# Patient Record
Sex: Female | Born: 1950
Health system: Southern US, Community
[De-identification: ages and names within clinical notes are randomized; demographics above are authoritative.]

## PROBLEM LIST (undated history)

## (undated) DIAGNOSIS — N390 Urinary tract infection, site not specified: Secondary | ICD-10-CM

## (undated) DIAGNOSIS — Z8719 Personal history of other diseases of the digestive system: Secondary | ICD-10-CM

## (undated) DIAGNOSIS — Z9889 Other specified postprocedural states: Secondary | ICD-10-CM

## (undated) DIAGNOSIS — E860 Dehydration: Secondary | ICD-10-CM

## (undated) DIAGNOSIS — K219 Gastro-esophageal reflux disease without esophagitis: Secondary | ICD-10-CM

## (undated) DIAGNOSIS — K209 Esophagitis, unspecified without bleeding: Secondary | ICD-10-CM

## (undated) DIAGNOSIS — F419 Anxiety disorder, unspecified: Secondary | ICD-10-CM

## (undated) DIAGNOSIS — E872 Acidosis, unspecified: Secondary | ICD-10-CM

## (undated) DIAGNOSIS — R112 Nausea with vomiting, unspecified: Secondary | ICD-10-CM

## (undated) DIAGNOSIS — K602 Anal fissure, unspecified: Secondary | ICD-10-CM

## (undated) DIAGNOSIS — E785 Hyperlipidemia, unspecified: Secondary | ICD-10-CM

## (undated) DIAGNOSIS — R42 Dizziness and giddiness: Secondary | ICD-10-CM

## (undated) DIAGNOSIS — N952 Postmenopausal atrophic vaginitis: Secondary | ICD-10-CM

## (undated) DIAGNOSIS — I10 Essential (primary) hypertension: Secondary | ICD-10-CM

## (undated) DIAGNOSIS — N920 Excessive and frequent menstruation with regular cycle: Secondary | ICD-10-CM

## (undated) DIAGNOSIS — K648 Other hemorrhoids: Secondary | ICD-10-CM

## (undated) DIAGNOSIS — M8580 Other specified disorders of bone density and structure, unspecified site: Secondary | ICD-10-CM

## (undated) DIAGNOSIS — C541 Malignant neoplasm of endometrium: Secondary | ICD-10-CM

## (undated) DIAGNOSIS — K469 Unspecified abdominal hernia without obstruction or gangrene: Secondary | ICD-10-CM

## (undated) DIAGNOSIS — T884XXA Failed or difficult intubation, initial encounter: Secondary | ICD-10-CM

## (undated) HISTORY — DX: Urinary tract infection, site not specified: N39.0

## (undated) HISTORY — DX: Other hemorrhoids: K64.8

## (undated) HISTORY — PX: DILATION AND CURETTAGE OF UTERUS: SHX78

## (undated) HISTORY — DX: Postmenopausal atrophic vaginitis: N95.2

## (undated) HISTORY — DX: Esophagitis, unspecified: K20.9

## (undated) HISTORY — DX: Gastro-esophageal reflux disease without esophagitis: K21.9

## (undated) HISTORY — DX: Excessive and frequent menstruation with regular cycle: N92.0

## (undated) HISTORY — DX: Acidosis, unspecified: E87.20

## (undated) HISTORY — DX: Unspecified abdominal hernia without obstruction or gangrene: K46.9

## (undated) HISTORY — DX: Essential (primary) hypertension: I10

## (undated) HISTORY — PX: TUBAL LIGATION: SHX77

## (undated) HISTORY — DX: Anal fissure, unspecified: K60.2

## (undated) HISTORY — DX: Dehydration: E86.0

## (undated) HISTORY — DX: Other specified disorders of bone density and structure, unspecified site: M85.80

## (undated) HISTORY — DX: Esophagitis, unspecified without bleeding: K20.90

## (undated) HISTORY — DX: Malignant neoplasm of endometrium: C54.1

## (undated) SURGERY — Surgical Case
Anesthesia: *Unknown

---

## 1995-05-30 HISTORY — PX: CHOLECYSTECTOMY: SHX55

## 2000-01-12 ENCOUNTER — Other Ambulatory Visit: Admission: RE | Admit: 2000-01-12 | Discharge: 2000-01-12 | Payer: Self-pay | Admitting: Family Medicine

## 2000-01-23 ENCOUNTER — Encounter: Admission: RE | Admit: 2000-01-23 | Discharge: 2000-01-23 | Payer: Self-pay | Admitting: Family Medicine

## 2000-01-23 ENCOUNTER — Encounter: Payer: Self-pay | Admitting: Family Medicine

## 2000-09-19 ENCOUNTER — Other Ambulatory Visit: Admission: RE | Admit: 2000-09-19 | Discharge: 2000-09-19 | Payer: Self-pay | Admitting: *Deleted

## 2000-10-17 ENCOUNTER — Other Ambulatory Visit: Admission: RE | Admit: 2000-10-17 | Discharge: 2000-10-17 | Payer: Self-pay | Admitting: Obstetrics and Gynecology

## 2000-10-17 ENCOUNTER — Encounter (INDEPENDENT_AMBULATORY_CARE_PROVIDER_SITE_OTHER): Payer: Self-pay

## 2000-11-15 ENCOUNTER — Ambulatory Visit (HOSPITAL_COMMUNITY): Admission: RE | Admit: 2000-11-15 | Discharge: 2000-11-15 | Payer: Self-pay | Admitting: Obstetrics and Gynecology

## 2000-11-15 ENCOUNTER — Encounter (INDEPENDENT_AMBULATORY_CARE_PROVIDER_SITE_OTHER): Payer: Self-pay | Admitting: Specialist

## 2000-11-15 HISTORY — PX: HYSTEROSCOPY: SHX211

## 2000-12-04 ENCOUNTER — Encounter (INDEPENDENT_AMBULATORY_CARE_PROVIDER_SITE_OTHER): Payer: Self-pay | Admitting: *Deleted

## 2000-12-04 ENCOUNTER — Inpatient Hospital Stay (HOSPITAL_COMMUNITY): Admission: RE | Admit: 2000-12-04 | Discharge: 2000-12-06 | Payer: Self-pay | Admitting: Obstetrics and Gynecology

## 2000-12-04 DIAGNOSIS — C541 Malignant neoplasm of endometrium: Secondary | ICD-10-CM

## 2000-12-04 HISTORY — PX: OOPHORECTOMY: SHX86

## 2000-12-04 HISTORY — PX: ABDOMINAL HYSTERECTOMY: SHX81

## 2000-12-04 HISTORY — DX: Malignant neoplasm of endometrium: C54.1

## 2001-01-23 ENCOUNTER — Encounter: Admission: RE | Admit: 2001-01-23 | Discharge: 2001-01-23 | Payer: Self-pay | Admitting: Obstetrics and Gynecology

## 2001-01-23 ENCOUNTER — Encounter: Payer: Self-pay | Admitting: Obstetrics and Gynecology

## 2001-02-28 ENCOUNTER — Other Ambulatory Visit: Admission: RE | Admit: 2001-02-28 | Discharge: 2001-02-28 | Payer: Self-pay | Admitting: Obstetrics and Gynecology

## 2001-06-11 ENCOUNTER — Other Ambulatory Visit: Admission: RE | Admit: 2001-06-11 | Discharge: 2001-06-11 | Payer: Self-pay | Admitting: Obstetrics and Gynecology

## 2001-09-05 ENCOUNTER — Other Ambulatory Visit: Admission: RE | Admit: 2001-09-05 | Discharge: 2001-09-05 | Payer: Self-pay | Admitting: Obstetrics and Gynecology

## 2001-12-26 ENCOUNTER — Other Ambulatory Visit: Admission: RE | Admit: 2001-12-26 | Discharge: 2001-12-26 | Payer: Self-pay | Admitting: Obstetrics and Gynecology

## 2002-01-24 ENCOUNTER — Encounter: Admission: RE | Admit: 2002-01-24 | Discharge: 2002-01-24 | Payer: Self-pay | Admitting: Obstetrics and Gynecology

## 2002-01-24 ENCOUNTER — Encounter: Payer: Self-pay | Admitting: Obstetrics and Gynecology

## 2002-07-04 ENCOUNTER — Other Ambulatory Visit: Admission: RE | Admit: 2002-07-04 | Discharge: 2002-07-04 | Payer: Self-pay | Admitting: Obstetrics and Gynecology

## 2002-07-04 ENCOUNTER — Encounter: Payer: Self-pay | Admitting: Emergency Medicine

## 2002-07-04 ENCOUNTER — Emergency Department (HOSPITAL_COMMUNITY): Admission: EM | Admit: 2002-07-04 | Discharge: 2002-07-04 | Payer: Self-pay | Admitting: Emergency Medicine

## 2003-01-26 ENCOUNTER — Other Ambulatory Visit: Admission: RE | Admit: 2003-01-26 | Discharge: 2003-01-26 | Payer: Self-pay | Admitting: Obstetrics and Gynecology

## 2003-01-28 ENCOUNTER — Encounter: Payer: Self-pay | Admitting: Obstetrics and Gynecology

## 2003-01-28 ENCOUNTER — Encounter: Admission: RE | Admit: 2003-01-28 | Discharge: 2003-01-28 | Payer: Self-pay | Admitting: Obstetrics and Gynecology

## 2003-07-13 ENCOUNTER — Other Ambulatory Visit: Admission: RE | Admit: 2003-07-13 | Discharge: 2003-07-13 | Payer: Self-pay | Admitting: Obstetrics and Gynecology

## 2003-07-21 ENCOUNTER — Emergency Department (HOSPITAL_COMMUNITY): Admission: EM | Admit: 2003-07-21 | Discharge: 2003-07-21 | Payer: Self-pay | Admitting: Emergency Medicine

## 2003-08-15 ENCOUNTER — Emergency Department (HOSPITAL_COMMUNITY): Admission: EM | Admit: 2003-08-15 | Discharge: 2003-08-15 | Payer: Self-pay | Admitting: Emergency Medicine

## 2003-10-02 ENCOUNTER — Emergency Department (HOSPITAL_COMMUNITY): Admission: EM | Admit: 2003-10-02 | Discharge: 2003-10-02 | Payer: Self-pay | Admitting: Emergency Medicine

## 2003-12-03 ENCOUNTER — Emergency Department (HOSPITAL_COMMUNITY): Admission: EM | Admit: 2003-12-03 | Discharge: 2003-12-03 | Payer: Self-pay | Admitting: Emergency Medicine

## 2004-01-27 ENCOUNTER — Other Ambulatory Visit: Admission: RE | Admit: 2004-01-27 | Discharge: 2004-01-27 | Payer: Self-pay | Admitting: Obstetrics and Gynecology

## 2004-02-15 ENCOUNTER — Emergency Department (HOSPITAL_COMMUNITY): Admission: EM | Admit: 2004-02-15 | Discharge: 2004-02-15 | Payer: Self-pay | Admitting: Emergency Medicine

## 2004-03-07 ENCOUNTER — Encounter: Admission: RE | Admit: 2004-03-07 | Discharge: 2004-03-07 | Payer: Self-pay | Admitting: Family Medicine

## 2004-04-04 ENCOUNTER — Ambulatory Visit: Payer: Self-pay | Admitting: Family Medicine

## 2004-04-26 ENCOUNTER — Ambulatory Visit: Payer: Self-pay | Admitting: Family Medicine

## 2004-05-24 ENCOUNTER — Emergency Department (HOSPITAL_COMMUNITY): Admission: EM | Admit: 2004-05-24 | Discharge: 2004-05-24 | Payer: Self-pay | Admitting: Emergency Medicine

## 2004-05-29 HISTORY — PX: CARDIAC CATHETERIZATION: SHX172

## 2004-05-31 ENCOUNTER — Emergency Department (HOSPITAL_COMMUNITY): Admission: EM | Admit: 2004-05-31 | Discharge: 2004-05-31 | Payer: Self-pay | Admitting: Emergency Medicine

## 2004-06-02 ENCOUNTER — Ambulatory Visit: Payer: Self-pay | Admitting: Family Medicine

## 2004-06-10 ENCOUNTER — Emergency Department (HOSPITAL_COMMUNITY): Admission: EM | Admit: 2004-06-10 | Discharge: 2004-06-10 | Payer: Self-pay | Admitting: Emergency Medicine

## 2004-06-17 ENCOUNTER — Emergency Department: Payer: Self-pay | Admitting: Emergency Medicine

## 2004-06-22 ENCOUNTER — Emergency Department (HOSPITAL_COMMUNITY): Admission: EM | Admit: 2004-06-22 | Discharge: 2004-06-22 | Payer: Self-pay | Admitting: Emergency Medicine

## 2004-06-23 ENCOUNTER — Ambulatory Visit: Payer: Self-pay | Admitting: Family Medicine

## 2004-06-26 ENCOUNTER — Emergency Department (HOSPITAL_COMMUNITY): Admission: EM | Admit: 2004-06-26 | Discharge: 2004-06-26 | Payer: Self-pay | Admitting: Emergency Medicine

## 2004-06-30 ENCOUNTER — Ambulatory Visit: Payer: Self-pay | Admitting: Family Medicine

## 2004-07-17 ENCOUNTER — Other Ambulatory Visit: Payer: Self-pay

## 2004-07-18 ENCOUNTER — Inpatient Hospital Stay: Payer: Self-pay | Admitting: Anesthesiology

## 2004-07-20 ENCOUNTER — Emergency Department: Payer: Self-pay | Admitting: Emergency Medicine

## 2004-07-20 ENCOUNTER — Other Ambulatory Visit: Payer: Self-pay

## 2004-08-11 ENCOUNTER — Emergency Department (HOSPITAL_COMMUNITY): Admission: EM | Admit: 2004-08-11 | Discharge: 2004-08-11 | Payer: Self-pay | Admitting: Emergency Medicine

## 2004-08-12 ENCOUNTER — Ambulatory Visit: Payer: Self-pay | Admitting: Internal Medicine

## 2004-08-15 ENCOUNTER — Other Ambulatory Visit: Admission: RE | Admit: 2004-08-15 | Discharge: 2004-08-15 | Payer: Self-pay | Admitting: Obstetrics and Gynecology

## 2004-08-16 ENCOUNTER — Emergency Department: Payer: Self-pay | Admitting: Emergency Medicine

## 2004-09-26 ENCOUNTER — Emergency Department (HOSPITAL_COMMUNITY): Admission: EM | Admit: 2004-09-26 | Discharge: 2004-09-27 | Payer: Self-pay | Admitting: Emergency Medicine

## 2004-10-30 ENCOUNTER — Emergency Department (HOSPITAL_COMMUNITY): Admission: EM | Admit: 2004-10-30 | Discharge: 2004-10-30 | Payer: Self-pay | Admitting: Emergency Medicine

## 2004-11-19 ENCOUNTER — Emergency Department: Payer: Self-pay | Admitting: Emergency Medicine

## 2004-11-19 ENCOUNTER — Other Ambulatory Visit: Payer: Self-pay

## 2004-12-29 ENCOUNTER — Emergency Department (HOSPITAL_COMMUNITY): Admission: EM | Admit: 2004-12-29 | Discharge: 2004-12-29 | Payer: Self-pay | Admitting: Emergency Medicine

## 2005-01-21 ENCOUNTER — Emergency Department: Payer: Self-pay | Admitting: Emergency Medicine

## 2005-01-22 ENCOUNTER — Emergency Department (HOSPITAL_COMMUNITY): Admission: EM | Admit: 2005-01-22 | Discharge: 2005-01-22 | Payer: Self-pay | Admitting: Emergency Medicine

## 2005-01-31 ENCOUNTER — Other Ambulatory Visit: Admission: RE | Admit: 2005-01-31 | Discharge: 2005-01-31 | Payer: Self-pay | Admitting: Obstetrics and Gynecology

## 2005-03-03 ENCOUNTER — Emergency Department (HOSPITAL_COMMUNITY): Admission: EM | Admit: 2005-03-03 | Discharge: 2005-03-03 | Payer: Self-pay | Admitting: Emergency Medicine

## 2005-03-15 ENCOUNTER — Encounter: Admission: RE | Admit: 2005-03-15 | Discharge: 2005-03-15 | Payer: Self-pay | Admitting: Obstetrics and Gynecology

## 2005-03-21 ENCOUNTER — Ambulatory Visit: Payer: Self-pay | Admitting: Cardiovascular Disease

## 2005-04-02 ENCOUNTER — Other Ambulatory Visit: Payer: Self-pay

## 2005-04-02 ENCOUNTER — Emergency Department: Payer: Self-pay | Admitting: Unknown Physician Specialty

## 2005-05-24 ENCOUNTER — Emergency Department (HOSPITAL_COMMUNITY): Admission: EM | Admit: 2005-05-24 | Discharge: 2005-05-24 | Payer: Self-pay | Admitting: Emergency Medicine

## 2005-08-07 ENCOUNTER — Other Ambulatory Visit: Admission: RE | Admit: 2005-08-07 | Discharge: 2005-08-07 | Payer: Self-pay | Admitting: Obstetrics and Gynecology

## 2005-09-14 ENCOUNTER — Emergency Department (HOSPITAL_COMMUNITY): Admission: EM | Admit: 2005-09-14 | Discharge: 2005-09-14 | Payer: Self-pay | Admitting: Emergency Medicine

## 2005-10-07 ENCOUNTER — Emergency Department: Payer: Self-pay | Admitting: General Practice

## 2005-12-01 ENCOUNTER — Emergency Department (HOSPITAL_COMMUNITY): Admission: EM | Admit: 2005-12-01 | Discharge: 2005-12-01 | Payer: Self-pay | Admitting: Emergency Medicine

## 2006-01-15 ENCOUNTER — Emergency Department: Payer: Self-pay | Admitting: Emergency Medicine

## 2006-01-15 ENCOUNTER — Other Ambulatory Visit: Payer: Self-pay

## 2006-02-01 ENCOUNTER — Other Ambulatory Visit: Admission: RE | Admit: 2006-02-01 | Discharge: 2006-02-01 | Payer: Self-pay | Admitting: Obstetrics and Gynecology

## 2006-03-29 ENCOUNTER — Encounter: Admission: RE | Admit: 2006-03-29 | Discharge: 2006-03-29 | Payer: Self-pay | Admitting: Obstetrics and Gynecology

## 2006-07-17 ENCOUNTER — Emergency Department: Payer: Self-pay | Admitting: Unknown Physician Specialty

## 2006-07-17 ENCOUNTER — Other Ambulatory Visit: Payer: Self-pay

## 2006-07-18 ENCOUNTER — Ambulatory Visit: Payer: Self-pay | Admitting: Unknown Physician Specialty

## 2006-08-06 ENCOUNTER — Emergency Department: Payer: Self-pay | Admitting: Internal Medicine

## 2006-08-06 ENCOUNTER — Other Ambulatory Visit: Payer: Self-pay

## 2006-10-25 ENCOUNTER — Other Ambulatory Visit: Payer: Self-pay

## 2006-10-25 ENCOUNTER — Emergency Department: Payer: Self-pay | Admitting: Internal Medicine

## 2007-01-04 ENCOUNTER — Emergency Department: Payer: Self-pay | Admitting: Emergency Medicine

## 2007-01-04 ENCOUNTER — Other Ambulatory Visit: Payer: Self-pay

## 2007-01-23 DIAGNOSIS — F411 Generalized anxiety disorder: Secondary | ICD-10-CM | POA: Insufficient documentation

## 2007-01-23 DIAGNOSIS — I10 Essential (primary) hypertension: Secondary | ICD-10-CM | POA: Insufficient documentation

## 2007-02-07 ENCOUNTER — Other Ambulatory Visit: Admission: RE | Admit: 2007-02-07 | Discharge: 2007-02-07 | Payer: Self-pay | Admitting: Obstetrics and Gynecology

## 2007-02-27 ENCOUNTER — Other Ambulatory Visit: Payer: Self-pay

## 2007-02-27 ENCOUNTER — Observation Stay: Payer: Self-pay | Admitting: Endocrinology

## 2007-03-09 ENCOUNTER — Emergency Department (HOSPITAL_COMMUNITY): Admission: EM | Admit: 2007-03-09 | Discharge: 2007-03-09 | Payer: Self-pay | Admitting: Emergency Medicine

## 2007-03-19 ENCOUNTER — Other Ambulatory Visit: Payer: Self-pay

## 2007-03-19 ENCOUNTER — Emergency Department: Payer: Self-pay | Admitting: Emergency Medicine

## 2007-05-06 ENCOUNTER — Encounter: Admission: RE | Admit: 2007-05-06 | Discharge: 2007-05-06 | Payer: Self-pay | Admitting: Obstetrics and Gynecology

## 2007-06-03 ENCOUNTER — Ambulatory Visit: Payer: Self-pay | Admitting: Gastroenterology

## 2007-07-30 ENCOUNTER — Emergency Department: Payer: Self-pay | Admitting: Emergency Medicine

## 2007-08-13 ENCOUNTER — Emergency Department: Payer: Self-pay | Admitting: Unknown Physician Specialty

## 2007-08-13 ENCOUNTER — Other Ambulatory Visit: Payer: Self-pay

## 2007-10-12 ENCOUNTER — Emergency Department: Payer: Self-pay | Admitting: Emergency Medicine

## 2007-10-12 ENCOUNTER — Other Ambulatory Visit: Payer: Self-pay

## 2008-02-17 ENCOUNTER — Other Ambulatory Visit: Admission: RE | Admit: 2008-02-17 | Discharge: 2008-02-17 | Payer: Self-pay | Admitting: Obstetrics and Gynecology

## 2008-02-17 ENCOUNTER — Ambulatory Visit: Payer: Self-pay | Admitting: Obstetrics and Gynecology

## 2008-02-17 ENCOUNTER — Encounter: Payer: Self-pay | Admitting: Obstetrics and Gynecology

## 2008-04-19 ENCOUNTER — Emergency Department: Payer: Self-pay | Admitting: Emergency Medicine

## 2008-05-20 ENCOUNTER — Encounter: Admission: RE | Admit: 2008-05-20 | Discharge: 2008-05-20 | Payer: Self-pay | Admitting: Obstetrics and Gynecology

## 2008-08-21 ENCOUNTER — Emergency Department: Payer: Self-pay | Admitting: Internal Medicine

## 2008-10-03 ENCOUNTER — Emergency Department: Payer: Self-pay | Admitting: Emergency Medicine

## 2008-11-10 ENCOUNTER — Observation Stay: Payer: Self-pay | Admitting: Endocrinology

## 2009-02-17 ENCOUNTER — Ambulatory Visit: Payer: Self-pay | Admitting: Obstetrics and Gynecology

## 2009-02-17 ENCOUNTER — Other Ambulatory Visit: Admission: RE | Admit: 2009-02-17 | Discharge: 2009-02-17 | Payer: Self-pay | Admitting: Obstetrics and Gynecology

## 2009-02-17 ENCOUNTER — Encounter: Payer: Self-pay | Admitting: Obstetrics and Gynecology

## 2009-03-03 ENCOUNTER — Ambulatory Visit: Payer: Self-pay | Admitting: Obstetrics and Gynecology

## 2009-05-26 ENCOUNTER — Encounter: Admission: RE | Admit: 2009-05-26 | Discharge: 2009-05-26 | Payer: Self-pay | Admitting: Obstetrics and Gynecology

## 2009-06-14 ENCOUNTER — Ambulatory Visit: Payer: Self-pay

## 2009-12-15 ENCOUNTER — Ambulatory Visit: Payer: Self-pay | Admitting: Gastroenterology

## 2009-12-19 LAB — PATHOLOGY REPORT

## 2010-03-01 ENCOUNTER — Ambulatory Visit: Payer: Self-pay | Admitting: Obstetrics and Gynecology

## 2010-03-01 ENCOUNTER — Other Ambulatory Visit: Admission: RE | Admit: 2010-03-01 | Discharge: 2010-03-01 | Payer: Self-pay | Admitting: Obstetrics and Gynecology

## 2010-03-21 ENCOUNTER — Ambulatory Visit: Payer: Self-pay | Admitting: Obstetrics and Gynecology

## 2010-04-11 ENCOUNTER — Ambulatory Visit: Payer: Self-pay | Admitting: Obstetrics and Gynecology

## 2010-04-12 ENCOUNTER — Encounter (INDEPENDENT_AMBULATORY_CARE_PROVIDER_SITE_OTHER): Payer: Self-pay | Admitting: Internal Medicine

## 2010-06-01 ENCOUNTER — Encounter
Admission: RE | Admit: 2010-06-01 | Discharge: 2010-06-01 | Payer: Self-pay | Source: Home / Self Care | Attending: Obstetrics and Gynecology | Admitting: Obstetrics and Gynecology

## 2010-06-18 ENCOUNTER — Encounter: Payer: Self-pay | Admitting: Obstetrics and Gynecology

## 2010-06-19 ENCOUNTER — Encounter: Payer: Self-pay | Admitting: Obstetrics and Gynecology

## 2010-06-30 NOTE — Letter (Signed)
Summary: Blood sugar results  Blood sugar results   Imported By: Dorna Leitz 04/12/2010 14:03:38  _____________________________________________________________________  External Attachment:    Type:   Image     Comment:   External Document

## 2010-07-01 ENCOUNTER — Ambulatory Visit: Payer: Self-pay | Admitting: Rheumatology

## 2010-10-14 NOTE — Discharge Summary (Signed)
[facility]  Patient:    Valerie [name], Valerie [name]                       MRN: [id number] Adm. Date:  [id number] Disc. 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  On the day of admission, she was taken to the operating room where a total abdominal hysterectomy, bilateral salpingo-oophorectomy, and peritoneal washings were obtained.  The patient was surgically staged. There was no significant myometrial invasion and therefore it was elected not to do a lymph node sampling.  Postoperatively, she had just a slight ileus that responded to IV fluids and by the second postoperative day, she was ready for discharge.  DISCHARGE MEDICATION:  Tylox for pain relief.  DISCHARGE ACTIVITY:  Ambulatory.  DISCHARGE DIET:  Regular.  FINAL PATHOLOGY REPORT:  Revealed peritoneal washings with no malignant cells identified.  Uterus with endometrioid carcinoma - grade I, adenomyosis, no significant myometrial invasion.  Pathology was read by Dr. Almyra Free and was also read by Dr. Guilford Shi and she was staged as a grade IAI.  CONDITION ON DISCHARGE:  Improved.  DISCHARGE DIAGNOSES: 1. Endometrioid carcinoma of endometrium, stage IAI. 2. Adenomyosis.  OPERATION:  Total abdominal hysterectomy, bilateral salpingo-oophorectomy. DD:  12/12/00 TD:  12/12/00 Job: [id number]

## 2010-10-14 NOTE — Op Note (Signed)
[facility]  Patient:    Valerie [name], Valerie [name]                       MRN: [id number] Proc. Date: 12/04/00 Adm. Date:  [id number] Attending:  Sharon Mt                           Operative Report  PREOPERATIVE DIAGNOSIS:  Well differentiated adenocarcinoma of the endometrium, endometrial type.  POSTOPERATIVE DIAGNOSIS:  Well differentiated adenocarcinoma of the endometrium, endometrial type.  OPERATION: 1. Total abdominal hysterectomy. 2. Bilateral salpingo-oophorectomy.  SURGEON:  Daniel L. Eda Paschal, M.D.  ASSISTANT:  Douglass Rivers, M.D.  ANESTHESIA:  General endotracheal anesthesia.  FINDINGS:  At the time of laparotomy, the patients uterus was normal size and shape and mobile.  Ovaries and fallopian tubes were free of any disease. There was a small area of burned out endometriosis near the left ureter of approximately 2 to 3 mm.  It was very adjacent to the ureter and did not appear to be active.  DESCRIPTION OF PROCEDURE:  After adequate general endotracheal anesthesia, the patient was placed in the supine position and prepped and draped in the usual sterile manner.   A Foley catheter was inserted in the patients bladder.  A midline vertical incision was made and extended through the fascia into the peritoneum.  Subcutaneous bleeders were clamped and treated with Bovie as encountered.  When the peritoneal cavity was opened, the above findings were noted.  Peritoneal washings were obtained.  The round ligaments were then Bovie cauterized and cut.  The vesicouterine fold of peritoneum was incised sharply.  The retroperitoneal space was entered.  The ureters were identified. The infundibulopelvic ligaments were clamped, cut, and doubly suture ligated with #1 chromic catgut.  The posterior peritoneum was sharply dissected free. The uterine arteries were clamped, cut, and doubly suture ligated with #1 chromic catgut.  The parametrium was  taken down in successive bites by clamping and cutting, and suture ligating with #1 chromic catgut.  The cervicovaginal structure was identified, entered with sharp dissection, and the uterus was removed and sent for frozen section along with the ovaries and tubes.  Angle sutures were placed in the angles of vagina incorporating cardinal ligaments and uterosacral ligaments for good wall support.  The cuff was closed with a running locking 0 Vicryl.  Two sponge, needle, and instrument counts were correct.  Frozen section came back showing no invasion, and they actually could not even identify frank malignancy on the samples they took.  There was some hyperplasia.  As a result of that, lymph nodes were not done.  The peritoneum and the fascia were closed with a running suture of looped 0 PDS, tying it in the midline.  Copious irrigation was done with Ringers lactate, and the skin was closed with staples.  Estimated blood loss for the entire procedure was approximately 350 cc with none replaced.  The patient tolerated the procedure well and left the operating room in satisfactory condition draining clear urine from the Foley catheter. DD:  12/04/00 TD:  12/04/00 Job: [id number]

## 2010-10-14 NOTE — Op Note (Signed)
[facility]  Patient:    Valerie [name], Valerie [name]                           MRN: [id number] Proc. Date: 11/15/00 Attending:  Rande Brunt. Eda Paschal, M.D.                           Operative Report  PREOPERATIVE DIAGNOSES:  Dysfunctional uterine bleeding with multiple endometrial polyps.  POSTOPERATIVE DIAGNOSES:  Dysfunctional uterine bleeding with multiple endometrial polyps.  OPERATION PERFORMED:  Hysteroscopy and D&C and resection of endometrial polyps.  SURGEON:  Dr. Eda Paschal.  ANESTHESIA:  General.  INDICATIONS FOR PROCEDURE:  The patients a 60 year old gravida 2, para 2, AB 0 who had presented to the office with severe menorrhagia. Sonohysterogram was done in the office which revealed multiple filling defects in the endometrial cavity consistent with endometrial polyps. She now enters the hospital for hysteroscopy and excision of endometrial polyps.  FINDINGS:  External and vagina is within normal limits. Cervix is clean, uterus is anteverted normal size and shape with first degree uterine descensus. Adnexa are palpably normal. At the time of hysteroscopy, the patient had multiple endometrial polyps on both the anterior and posterior walls of the fundus. Once they all had been excised, there was no other pathology. The entire intrauterine cavity and endocervical canal could be systematically evaluated and was free of disease once the multiple polyps had been removed.  DESCRIPTION OF PROCEDURE:  After adequate general endotracheal anesthesia, the patient was placed in the dorsal lithotomy position, prepped and draped in the usual sterile manner. A single tooth tenaculum was placed in the anterior lip of the cervix, the cervix was dilated to a #35 Pratt dilator and then a hysteroscopic examination was done with the hysteroscopic resectoscope using 3% sorbitol to expand the intrauterine cavity and using a camera for magnification. Multiple polyps were seen.  A lot of them were removed at this point with sharp curettage and then the rest were removed with 90 degree wire loop set at 70 coag and 110 cutting blend one. At the termination of the procedure, the cavity was completely free of any polyps. All tissue was sent to pathology for tissue diagnosis. Estimated blood loss was less than 100 cc with a fluid deficit that was also less than 100 cc. The patient tolerated the procedure well and left the operating room in satisfactory condition. DD:  11/15/00 TD:  11/15/00 Job: [id number]

## 2010-10-14 NOTE — H&P (Signed)
[facility]  Patient:    Valerie [name], Valerie [name]                           MRN: [id number] Attending:  Rande Brunt. Eda Paschal, M.D.                         History and Physical  CHIEF COMPLAINT:  Adenocarcinoma of the endometrium.  HISTORY OF PRESENT ILLNESS:  The patient is a 60 year old gravida 3, para 2, AB 1 who presented to the office in late April 2002 with a history of menorrhagia.  Sonohysterogram was done in the office, which revealed some endometrial polyps, and as a result of this she was taken to the operating room on June 20 and underwent D&C hysteroscopy, with excision of a lot of tissue in the endometrium.  Final pathology report came back well-differentiated endometrioid adenocarcinoma with extensive squamous differentiation, ______ grade 1.  It was associated with secretory endometrium.  As a result of this, she enters the hospital now for total abdominal hysterectomy, bilateral salpingo-oophorectomy, and surgical staging with appropriate additional surgery.  Dr. De Blanch will be on standby if that is necessary.  PAST MEDICAL HISTORY:  Tubal ligation in the past, D&C hysteroscopy this year.  CURRENT MEDICATIONS:  None.  ALLERGIES:  She is allergic to no drugs.  FAMILY HISTORY:  She has two brothers, both of whom are diabetic.  She has a father who has had prostate cancer.  SOCIAL HISTORY:  She is a nonsmoker, nondrinker.  REVIEW OF SYSTEMS:  HEENT:  Negative.  CARDIAC:  Negative.  RESPIRATORY: Negative.  GI:  Negative.  GU:  Negative.  NEUROMUSCULAR:  Negative. ENDOCRINE:  Negative.  PHYSICAL EXAMINATION:  GENERAL:  The patient is a well-developed and well-nourished female in no acute distress.  VITAL SIGNS:  Blood pressure 132/86, pulse 80 and regular, respirations 16 and nonlabored.  She is afebrile.  HEENT:  All within normal limits.  NECK:  Supple.  Trachea in the midline.  Thyroid is not enlarged.  LUNGS:  Clear to  P&A.  HEART:  No thrills, heaves, or murmurs.  BREASTS:  No masses.  ABDOMEN:  Soft without guarding, rebound, or masses.  PELVIC:  External and vaginal is within normal limits.  Cervix is clean. Uterus is normal size and shape.  Adnexa are palpably normal.  RECTAL:  Negative.  EXTREMITIES:  Within normal limits.  IMPRESSION:  Well-differentiated adenocarcinoma of the endometrium.  PLAN:  Appropriate surgery, see above. DD:  12/04/00 TD:  12/04/00 Job: [id number]

## 2011-03-03 ENCOUNTER — Encounter: Payer: Self-pay | Admitting: Obstetrics and Gynecology

## 2011-03-03 DIAGNOSIS — E559 Vitamin D deficiency, unspecified: Secondary | ICD-10-CM | POA: Insufficient documentation

## 2011-03-03 DIAGNOSIS — K219 Gastro-esophageal reflux disease without esophagitis: Secondary | ICD-10-CM | POA: Insufficient documentation

## 2011-03-03 DIAGNOSIS — K469 Unspecified abdominal hernia without obstruction or gangrene: Secondary | ICD-10-CM | POA: Insufficient documentation

## 2011-03-03 DIAGNOSIS — N952 Postmenopausal atrophic vaginitis: Secondary | ICD-10-CM | POA: Insufficient documentation

## 2011-03-03 DIAGNOSIS — M8580 Other specified disorders of bone density and structure, unspecified site: Secondary | ICD-10-CM | POA: Insufficient documentation

## 2011-03-03 DIAGNOSIS — C541 Malignant neoplasm of endometrium: Secondary | ICD-10-CM | POA: Insufficient documentation

## 2011-03-03 DIAGNOSIS — N920 Excessive and frequent menstruation with regular cycle: Secondary | ICD-10-CM | POA: Insufficient documentation

## 2011-03-07 ENCOUNTER — Inpatient Hospital Stay: Admission: RE | Admit: 2011-03-07 | Payer: Self-pay | Source: Ambulatory Visit

## 2011-03-09 ENCOUNTER — Other Ambulatory Visit: Payer: Self-pay | Admitting: Obstetrics and Gynecology

## 2011-03-09 LAB — POCT CARDIAC MARKERS
CKMB, poc: 1.1
CKMB, poc: 1.2
Myoglobin, poc: 77.2
Myoglobin, poc: 79.6
Operator id: 133351
Operator id: 133351
Troponin i, poc: <0.05
Troponin i, poc: <0.05

## 2011-03-09 LAB — CBC
HCT: 39.1
Hemoglobin: 13.3
MCHC: 33.9
MCV: 92.6
Platelets: 267
RBC: 4.22
RDW: 14.2 — ABNORMAL HIGH
WBC: 6.2

## 2011-03-09 LAB — DIFFERENTIAL
Basophils Absolute: 0
Basophils Relative: 1
Eosinophils Absolute: 0.1
Eosinophils Relative: 2
Lymphocytes Relative: 47 — ABNORMAL HIGH
Lymphs Abs: 2.9
Monocytes Absolute: 0.5
Monocytes Relative: 8
Neutro Abs: 2.6
Neutrophils Relative %: 42 — ABNORMAL LOW

## 2011-03-09 LAB — I-STAT 8, (EC8 V) (CONVERTED LAB)
Acid-Base Excess: 1
BUN: 17
Bicarbonate: 25.8 — ABNORMAL HIGH
Chloride: 109
Glucose, Bld: 92
HCT: 41
Hemoglobin: 13.9
Operator id: 133351
Potassium: 3.9
Sodium: 142
TCO2: 27
pCO2, Ven: 38.8 — ABNORMAL LOW
pH, Ven: 7.431 — ABNORMAL HIGH

## 2011-03-09 LAB — HEPATIC FUNCTION PANEL
ALT: 21
AST: 27
Albumin: 3.8
Alkaline Phosphatase: 52
Bilirubin, Direct: <0.1
Total Bilirubin: 0.5
Total Protein: 6.9

## 2011-03-09 LAB — POCT I-STAT CREATININE
Creatinine, Ser: 0.8
Operator id: 133351

## 2011-03-09 LAB — D-DIMER, QUANTITATIVE (NOT AT ARMC): D-Dimer, Quant: 0.29

## 2011-04-06 ENCOUNTER — Other Ambulatory Visit: Payer: Self-pay | Admitting: Obstetrics and Gynecology

## 2011-05-07 ENCOUNTER — Emergency Department (HOSPITAL_COMMUNITY)
Admission: EM | Admit: 2011-05-07 | Discharge: 2011-05-07 | Disposition: A | Discharge status: Home / Self Care | Payer: BC Managed Care – PPO | Attending: Emergency Medicine | Admitting: Emergency Medicine

## 2011-05-07 ENCOUNTER — Encounter (HOSPITAL_COMMUNITY): Payer: Self-pay | Admitting: Emergency Medicine

## 2011-05-07 DIAGNOSIS — M25519 Pain in unspecified shoulder: Secondary | ICD-10-CM | POA: Insufficient documentation

## 2011-05-07 DIAGNOSIS — I10 Essential (primary) hypertension: Secondary | ICD-10-CM | POA: Insufficient documentation

## 2011-05-07 DIAGNOSIS — M542 Cervicalgia: Secondary | ICD-10-CM | POA: Insufficient documentation

## 2011-05-07 DIAGNOSIS — Z9889 Other specified postprocedural states: Secondary | ICD-10-CM | POA: Insufficient documentation

## 2011-05-07 DIAGNOSIS — K219 Gastro-esophageal reflux disease without esophagitis: Secondary | ICD-10-CM | POA: Insufficient documentation

## 2011-05-07 DIAGNOSIS — IMO0002 Reserved for concepts with insufficient information to code with codable children: Secondary | ICD-10-CM | POA: Insufficient documentation

## 2011-05-07 DIAGNOSIS — E559 Vitamin D deficiency, unspecified: Secondary | ICD-10-CM | POA: Insufficient documentation

## 2011-05-07 DIAGNOSIS — Z8673 Personal history of transient ischemic attack (TIA), and cerebral infarction without residual deficits: Secondary | ICD-10-CM | POA: Insufficient documentation

## 2011-05-07 HISTORY — DX: Dizziness and giddiness: R42

## 2011-05-07 MED ORDER — HYDROCODONE-ACETAMINOPHEN 5-325 MG PO TABS
1.0000 | ORAL_TABLET | Freq: Once | ORAL | Status: AC
  Administered 2011-05-07: 1 via ORAL
  Filled 2011-05-07: qty 1

## 2011-05-07 MED ORDER — HYDROCODONE-ACETAMINOPHEN 5-500 MG PO TABS: 1.0000 | ORAL_TABLET | Freq: Four times a day (QID) | ORAL | Status: AC | PRN

## 2011-05-07 MED ORDER — METHOCARBAMOL 750 MG PO TABS: 750.0000 mg | ORAL_TABLET | Freq: Three times a day (TID) | ORAL | Status: AC

## 2011-05-07 MED ORDER — IBUPROFEN 200 MG PO TABS
600.0000 mg | ORAL_TABLET | Freq: Once | ORAL | Status: AC
  Administered 2011-05-07: 600 mg via ORAL
  Filled 2011-05-07: qty 3

## 2011-05-07 NOTE — ED Notes (Signed)
Rx given x2 D/c instructions reviewed w/ pt and family - pt and family deny any further questions or concerns at present.  

## 2011-05-07 NOTE — ED Notes (Signed)
C/o neck pain that has been worse over the past week.  History of neck pain.  Reports dizziness with driving.

## 2011-05-07 NOTE — ED Provider Notes (Signed)
History     CSN: [id number] Arrival date & time: 05/07/2011  6:10 PM   First MD Initiated Contact with Patient 05/07/11 1903      Chief Complaint  Patient presents with  . Neck Pain    (Consider location/radiation/quality/duration/timing/severity/associated sxs/prior treatment) Patient is a 60 y.o. female presenting with neck pain. The history is provided by the patient.  Neck Pain  Pertinent negatives include no chest pain, no numbness, no headaches and no weakness.  pt states has hx ddd, neck pain, in past week increased neck pain.occasionally radiates post to shoulder. No arm pain. No numbness/weakness. No recent neck injury or strain. No headache. No ant neck pain, no chest pain. Pt saw pcp, [facility]ed, w same, given decadron taper. No current radicular pain, but says neck pain persists. Gradual onset. Constant, dull, worse w palp and certain movements. No fever or chills. No uri c/o.   Past Medical History  Diagnosis Date  . Hypertension   . Acid reflux   . Hernia   . Menorrhagia   . Osteopenia   . Vitamin D deficiency   . Atrophic vaginitis   . Adenocarcinoma of the endometrium/uterus 12/04/2000  . Vertigo     Past Surgical History  Procedure Date  . Tubal ligation   . Cholecystectomy 1997  . Cardiac catheterization 2006  . Hysteroscopy 11/15/2000    D & C, resection of endometrial polyps  . Dilation and curettage of uterus     11/15/2000  . Abdominal hysterectomy 12/04/2000    also bilateral salpingo-oophorectomy  . Oophorectomy 12/04/2000    bilateral salpingo-oophorectomy done with TAH    Family History  Problem Relation Age of Onset  . Cancer Father     prostate cancer  . Hypertension Father   . Hypertension Sister   . Diabetes Brother   . Heart disease Brother   . Hypertension Brother   . Diabetes Brother   . Heart disease Brother     History  Substance Use Topics  . Smoking status: Never Smoker   . Smokeless tobacco: Never Used  . Alcohol Use: No      OB History    Grav Para Term Preterm Abortions TAB SAB Ect Mult Living   3 2   1 1           Review of Systems  Constitutional: Negative for fever.  HENT: Positive for neck pain.   Respiratory: Negative for shortness of breath.   Cardiovascular: Negative for chest pain.  Gastrointestinal: Negative for abdominal pain.  Musculoskeletal: Negative for back pain.  Skin: Negative for rash.  Neurological: Negative for weakness, numbness and headaches.  Hematological: Does not bruise/bleed easily.  Psychiatric/Behavioral: Negative for confusion.    Allergies  Review of patient's allergies indicates no known allergies.  Home Medications   Current Outpatient Rx  Name Route Sig Dispense Refill  . ASPIRIN 81 MG PO TABS Oral Take 81 mg by mouth daily.      . ATORVASTATIN CALCIUM 10 MG PO TABS Oral Take 10 mg by mouth daily.      Marland Kitchen CALTRATE 600+D PLUS PO Oral Take 1 tablet by mouth 2 (two) times daily.      Marland Kitchen ESOMEPRAZOLE MAGNESIUM 40 MG PO CPDR Oral Take 40 mg by mouth daily before breakfast.      . FLUTICASONE PROPIONATE 50 MCG/ACT NA SUSP Nasal Place 2 sprays into the nose daily.      . METHYLPREDNISOLONE 4 MG PO TABS Oral Take 4 mg by  mouth daily. Patient on the third day of a 6 day dose pack     . METOPROLOL SUCCINATE ER 100 MG PO TB24 Oral Take 100 mg by mouth daily.      . TELMISARTAN 40 MG PO TABS Oral Take 40 mg by mouth daily.      Marland Kitchen VITAMIN D (ERGOCALCIFEROL) 50000 UNITS PO CAPS Oral Take 50,000 Units by mouth every 7 (seven) days.        BP 120/74  Pulse 71  Temp(Src) 98.7 F (37.1 C) (Oral)  Resp 16  SpO2 97%  Physical Exam  Nursing note and vitals reviewed. Constitutional: She is oriented to person, place, and time. She appears well-developed and well-nourished. No distress.  HENT:  Right Ear: External ear normal.  Left Ear: External ear normal.  Nose: Nose normal.  Eyes: Conjunctivae are normal. Pupils are equal, round, and reactive to light. No scleral  icterus.  Neck: Normal range of motion. Neck supple. No tracheal deviation present.       Cervical muscular and trap tenderness bil. No spine/midline tenderness. Spine aligned, no step off.   Cardiovascular: Normal rate, regular rhythm, normal heart sounds and intact distal pulses.   Pulmonary/Chest: Effort normal and breath sounds normal. No respiratory distress.  Abdominal: Normal appearance. She exhibits no distension.  Musculoskeletal: She exhibits no edema.       Spine nt.   Neurological: She is alert and oriented to person, place, and time.       Motor intact bil, steady gait  Skin: Skin is warm and dry. No rash noted.  Psychiatric: She has a normal mood and affect.    ED Course  Procedures (including critical care time)    MDM  Pt has ride, does not have to drive. Motrin, vicodin for pain. Discussed diff dx w pt, ddd, nf stenosis, other muscul skel pain, etc, discussed plan pain control, completion dose pack, close pcp follow up.         Suzi Roots, MD 05/07/11 [phone]

## 2011-05-08 ENCOUNTER — Other Ambulatory Visit: Payer: Self-pay | Admitting: Obstetrics and Gynecology

## 2011-05-30 HISTORY — PX: CERVICAL SPINE SURGERY: SHX589

## 2011-07-11 ENCOUNTER — Ambulatory Visit: Payer: Self-pay | Admitting: Rheumatology

## 2011-07-31 ENCOUNTER — Encounter: Payer: Self-pay | Admitting: Rheumatology

## 2011-09-16 ENCOUNTER — Emergency Department: Payer: Self-pay | Admitting: Emergency Medicine

## 2011-09-16 LAB — CBC
HCT: 39.6 % (ref 35.0–47.0)
HGB: 12.9 g/dL (ref 12.0–16.0)
MCH: 31.6 pg (ref 26.0–34.0)
MCHC: 32.7 g/dL (ref 32.0–36.0)
MCV: 97 fL (ref 80–100)
Platelet: 259 10*3/uL (ref 150–440)
RBC: 4.1 10*6/uL (ref 3.80–5.20)
RDW: 14.8 % — ABNORMAL HIGH (ref 11.5–14.5)
WBC: 7 10*3/uL (ref 3.6–11.0)

## 2011-09-16 LAB — BASIC METABOLIC PANEL
Anion Gap: 12 (ref 7–16)
BUN: 24 mg/dL — ABNORMAL HIGH (ref 7–18)
Calcium, Total: 8.7 mg/dL (ref 8.5–10.1)
Chloride: 101 mmol/L (ref 98–107)
Co2: 27 mmol/L (ref 21–32)
Creatinine: 1.03 mg/dL (ref 0.60–1.30)
EGFR (African American): >60
EGFR (Non-African Amer.): 59 — ABNORMAL LOW
Glucose: 87 mg/dL (ref 65–99)
Osmolality: 283 (ref 275–301)
Potassium: 3.5 mmol/L (ref 3.5–5.1)
Sodium: 140 mmol/L (ref 136–145)

## 2011-09-16 LAB — TROPONIN I
Troponin-I: <0.02 ng/mL
Troponin-I: <0.02 ng/mL

## 2011-10-10 ENCOUNTER — Other Ambulatory Visit: Payer: Self-pay | Admitting: Neurosurgery

## 2011-10-20 ENCOUNTER — Ambulatory Visit: Payer: Self-pay | Admitting: Internal Medicine

## 2011-10-27 ENCOUNTER — Ambulatory Visit: Payer: Self-pay | Admitting: Internal Medicine

## 2011-11-09 NOTE — Pre-Procedure Instructions (Signed)
Valerie [name]  11/09/2011   Your procedure is scheduled on:November 20, 2011 Monday at 0730 AM     Report to [facility] at 0530 AM.  Call this number if you have problems the morning of surgery: [phone]   Remember:   Do not eat food:After Midnight.Sunday  .  Take these medicines the morning of surgery with A SIP OF WATER: Metoprolol[Toprol-XL]   Do not wear jewelry, make-up or nail polish.  Do not wear lotions, powders, or perfumes. You may wear deodorant.  Do not shave 48 hours prior to surgery. Men may shave face and neck.  Do not bring valuables to the hospital.  Contacts, dentures or bridgework may not be worn into surgery.  Leave suitcase in the car. After surgery it may be brought to your room.  For patients admitted to the hospital, checkout time is 11:00 AM the day of discharge.   Patients discharged the day of surgery will not be allowed to drive home.    Special Instructions: CHG Shower Use Special Wash: 1/2 bottle night before surgery and 1/2 bottle morning of surgery.   Please read over the following fact sheets that you were given: Pain Booklet, Coughing and Deep Breathing, MRSA Information and Surgical Site Infection Prevention

## 2011-11-10 ENCOUNTER — Encounter (HOSPITAL_COMMUNITY): Payer: Self-pay | Admitting: Pharmacy Technician

## 2011-11-13 ENCOUNTER — Ambulatory Visit (HOSPITAL_COMMUNITY)
Admission: RE | Admit: 2011-11-13 | Discharge: 2011-11-13 | Disposition: A | Discharge status: Home / Self Care | Payer: BC Managed Care – PPO | Source: Ambulatory Visit | Attending: Neurosurgery | Admitting: Neurosurgery

## 2011-11-13 ENCOUNTER — Other Ambulatory Visit (HOSPITAL_COMMUNITY): Payer: BC Managed Care – PPO

## 2011-11-13 ENCOUNTER — Encounter (HOSPITAL_COMMUNITY)
Admission: RE | Admit: 2011-11-13 | Discharge: 2011-11-13 | Disposition: A | Discharge status: Home / Self Care | Payer: BC Managed Care – PPO | Source: Ambulatory Visit | Attending: Neurosurgery | Admitting: Neurosurgery

## 2011-11-13 DIAGNOSIS — Z01818 Encounter for other preprocedural examination: Secondary | ICD-10-CM | POA: Insufficient documentation

## 2011-11-13 LAB — CBC
HCT: 38.1 % (ref 36.0–46.0)
Hemoglobin: 12.9 g/dL (ref 12.0–15.0)
MCH: 31.9 pg (ref 26.0–34.0)
MCHC: 33.9 g/dL (ref 30.0–36.0)
MCV: 94.3 fL (ref 78.0–100.0)
Platelets: 237 10*3/uL (ref 150–400)
RBC: 4.04 MIL/uL (ref 3.87–5.11)
RDW: 13.5 % (ref 11.5–15.5)
WBC: 7 10*3/uL (ref 4.0–10.5)

## 2011-11-13 LAB — BASIC METABOLIC PANEL
BUN: 14 mg/dL (ref 6–23)
CO2: 26 mEq/L (ref 19–32)
Calcium: 9.3 mg/dL (ref 8.4–10.5)
Chloride: 102 mEq/L (ref 96–112)
Creatinine, Ser: 0.92 mg/dL (ref 0.50–1.10)
GFR calc Af Amer: 77 mL/min — ABNORMAL LOW (ref >90)
GFR calc non Af Amer: 67 mL/min — ABNORMAL LOW (ref >90)
Glucose, Bld: 97 mg/dL (ref 70–99)
Potassium: 3.9 mEq/L (ref 3.5–5.1)
Sodium: 138 mEq/L (ref 135–145)

## 2011-11-13 LAB — SURGICAL PCR SCREEN
MRSA, PCR: NEGATIVE
Staphylococcus aureus: NEGATIVE

## 2011-11-13 NOTE — Pre-Procedure Instructions (Signed)
20 Valerie [name]  11/13/2011   Your procedure is scheduled on:  Mon, June 24 @ 7:30 AM  Report to [facility] at 5:30 AM.  Call this number if you have problems the morning of surgery: [phone]   Remember:   Do not eat food:After Midnight.  Take these medicines the morning of surgery with A SIP OF WATER: Nexium(Esomeprazole),Flonase(Fluticasone),Metoprolol(Toprol),and Micardis(Telmisartin)   Do not wear jewelry, make-up or nail polish.  Do not wear lotions, powders, or perfumes.   Do not shave 48 hours prior to surgery.   Do not bring valuables to the hospital.  Contacts, dentures or bridgework may not be worn into surgery.  Leave suitcase in the car. After surgery it may be brought to your room.  For patients admitted to the hospital, checkout time is 11:00 AM the day of discharge.   Patients discharged the day of surgery will not be allowed to drive home.  Special Instructions: CHG Shower Use Special Wash: 1/2 bottle night before surgery and 1/2 bottle morning of surgery.   Please read over the following fact sheets that you were given: Pain Booklet, Coughing and Deep Breathing, MRSA Information and Surgical Site Infection Prevention

## 2011-11-19 MED ORDER — CEFAZOLIN SODIUM 1-5 GM-% IV SOLN
1.0000 g | INTRAVENOUS | Status: AC
  Administered 2011-11-20: 1 g via INTRAVENOUS
  Filled 2011-11-19: qty 50

## 2011-11-20 ENCOUNTER — Encounter (HOSPITAL_COMMUNITY): Payer: Self-pay | Admitting: Surgery

## 2011-11-20 ENCOUNTER — Ambulatory Visit (HOSPITAL_COMMUNITY): Payer: BC Managed Care – PPO | Admitting: Anesthesiology

## 2011-11-20 ENCOUNTER — Encounter (HOSPITAL_COMMUNITY): Payer: Self-pay | Admitting: Anesthesiology

## 2011-11-20 ENCOUNTER — Encounter (HOSPITAL_COMMUNITY)
Admission: RE | Disposition: A | Discharge status: Home / Self Care | Payer: Self-pay | Source: Ambulatory Visit | Attending: Neurosurgery

## 2011-11-20 ENCOUNTER — Inpatient Hospital Stay (HOSPITAL_COMMUNITY)
Admission: RE | Admit: 2011-11-20 | Discharge: 2011-11-21 | DRG: 865 | Disposition: A | Discharge status: Home / Self Care | Payer: BC Managed Care – PPO | Source: Ambulatory Visit | Attending: Neurosurgery | Admitting: Neurosurgery

## 2011-11-20 ENCOUNTER — Ambulatory Visit (HOSPITAL_COMMUNITY): Payer: BC Managed Care – PPO

## 2011-11-20 DIAGNOSIS — I10 Essential (primary) hypertension: Secondary | ICD-10-CM | POA: Diagnosis present

## 2011-11-20 DIAGNOSIS — Z8542 Personal history of malignant neoplasm of other parts of uterus: Secondary | ICD-10-CM

## 2011-11-20 DIAGNOSIS — Z79899 Other long term (current) drug therapy: Secondary | ICD-10-CM

## 2011-11-20 DIAGNOSIS — Z7982 Long term (current) use of aspirin: Secondary | ICD-10-CM

## 2011-11-20 DIAGNOSIS — G5600 Carpal tunnel syndrome, unspecified upper limb: Secondary | ICD-10-CM | POA: Diagnosis present

## 2011-11-20 DIAGNOSIS — M47812 Spondylosis without myelopathy or radiculopathy, cervical region: Principal | ICD-10-CM | POA: Diagnosis present

## 2011-11-20 DIAGNOSIS — K219 Gastro-esophageal reflux disease without esophagitis: Secondary | ICD-10-CM | POA: Diagnosis present

## 2011-11-20 DIAGNOSIS — Z8249 Family history of ischemic heart disease and other diseases of the circulatory system: Secondary | ICD-10-CM

## 2011-11-20 DIAGNOSIS — Z833 Family history of diabetes mellitus: Secondary | ICD-10-CM

## 2011-11-20 HISTORY — DX: Other specified postprocedural states: R11.2

## 2011-11-20 HISTORY — PX: CARPAL TUNNEL RELEASE: SHX101

## 2011-11-20 HISTORY — DX: Other specified postprocedural states: Z98.890

## 2011-11-20 SURGERY — ANTERIOR CERVICAL DECOMPRESSION/DISCECTOMY FUSION 4 LEVELS
Anesthesia: General | Site: Wrist | Wound class: Clean

## 2011-11-20 MED ORDER — SUFENTANIL CITRATE 50 MCG/ML IV SOLN
INTRAVENOUS | Status: DC | PRN
  Administered 2011-11-20 (×2): 10 ug via INTRAVENOUS
  Administered 2011-11-20: 20 ug via INTRAVENOUS
  Administered 2011-11-20: 10 ug via INTRAVENOUS

## 2011-11-20 MED ORDER — PROPOFOL 10 MG/ML IV EMUL
INTRAVENOUS | Status: DC | PRN
  Administered 2011-11-20: 170 mg via INTRAVENOUS

## 2011-11-20 MED ORDER — SODIUM CHLORIDE 0.9 % IV SOLN: 250.0000 mL | INTRAVENOUS | Status: DC

## 2011-11-20 MED ORDER — LIDOCAINE HCL (CARDIAC) 20 MG/ML IV SOLN
INTRAVENOUS | Status: DC | PRN
  Administered 2011-11-20: 80 mg via INTRAVENOUS

## 2011-11-20 MED ORDER — LIDOCAINE-EPINEPHRINE 1 %-1:100000 IJ SOLN
INTRAMUSCULAR | Status: DC | PRN
  Administered 2011-11-20: 5 mL

## 2011-11-20 MED ORDER — SODIUM CHLORIDE 0.9 % IJ SOLN: 3.0000 mL | INTRAMUSCULAR | Status: DC | PRN

## 2011-11-20 MED ORDER — VITAMIN D (ERGOCALCIFEROL) 1.25 MG (50000 UNIT) PO CAPS: 50000.0000 [IU] | ORAL_CAPSULE | ORAL | Status: DC

## 2011-11-20 MED ORDER — METOPROLOL SUCCINATE ER 100 MG PO TB24
200.0000 mg | ORAL_TABLET | Freq: Every day | ORAL | Status: DC
  Filled 2011-11-20: qty 2

## 2011-11-20 MED ORDER — ASPIRIN EC 81 MG PO TBEC
81.0000 mg | DELAYED_RELEASE_TABLET | Freq: Every day | ORAL | Status: DC
  Filled 2011-11-20 (×2): qty 1

## 2011-11-20 MED ORDER — PHENOL 1.4 % MT LIQD
1.0000 | OROMUCOSAL | Status: DC | PRN
  Administered 2011-11-21: 1 via OROMUCOSAL
  Filled 2011-11-20: qty 177

## 2011-11-20 MED ORDER — ATORVASTATIN CALCIUM 10 MG PO TABS
10.0000 mg | ORAL_TABLET | Freq: Every day | ORAL | Status: DC
  Filled 2011-11-20 (×2): qty 1

## 2011-11-20 MED ORDER — LACTATED RINGERS IV SOLN
INTRAVENOUS | Status: DC | PRN
  Administered 2011-11-20: 07:00:00 via INTRAVENOUS

## 2011-11-20 MED ORDER — CEFAZOLIN SODIUM 1-5 GM-% IV SOLN
1.0000 g | Freq: Three times a day (TID) | INTRAVENOUS | Status: AC
  Administered 2011-11-20 – 2011-11-21 (×2): 1 g via INTRAVENOUS
  Filled 2011-11-20 (×2): qty 50

## 2011-11-20 MED ORDER — MENTHOL 3 MG MT LOZG
1.0000 | LOZENGE | OROMUCOSAL | Status: DC | PRN
  Administered 2011-11-21: 3 mg via ORAL
  Filled 2011-11-20: qty 9

## 2011-11-20 MED ORDER — BACITRACIN 50000 UNITS IM SOLR
INTRAMUSCULAR | Status: AC
  Filled 2011-11-20: qty 1

## 2011-11-20 MED ORDER — CYCLOBENZAPRINE HCL 10 MG PO TABS: 10.0000 mg | ORAL_TABLET | Freq: Three times a day (TID) | ORAL | Status: DC | PRN

## 2011-11-20 MED ORDER — DEXAMETHASONE SODIUM PHOSPHATE 10 MG/ML IJ SOLN
INTRAMUSCULAR | Status: DC | PRN
  Administered 2011-11-20: 10 mg via INTRAVENOUS

## 2011-11-20 MED ORDER — METOPROLOL SUCCINATE ER 100 MG PO TB24: 200.0000 mg | ORAL_TABLET | Freq: Every day | ORAL | Status: DC

## 2011-11-20 MED ORDER — OXYCODONE-ACETAMINOPHEN 5-325 MG PO TABS
1.0000 | ORAL_TABLET | ORAL | Status: DC | PRN
  Administered 2011-11-21: 1 via ORAL
  Filled 2011-11-20: qty 1

## 2011-11-20 MED ORDER — KETOROLAC TROMETHAMINE 0.5 % OP SOLN
1.0000 [drp] | Freq: Four times a day (QID) | OPHTHALMIC | Status: DC
  Administered 2011-11-20 (×2): 1 [drp] via OPHTHALMIC
  Filled 2011-11-20: qty 3

## 2011-11-20 MED ORDER — NEOSTIGMINE METHYLSULFATE 1 MG/ML IJ SOLN
INTRAMUSCULAR | Status: DC | PRN
  Administered 2011-11-20: 3 mg via INTRAVENOUS

## 2011-11-20 MED ORDER — SURGIFOAM 100 EX MISC
CUTANEOUS | Status: DC | PRN
  Administered 2011-11-20: 09:00:00 via TOPICAL

## 2011-11-20 MED ORDER — FLUTICASONE PROPIONATE 50 MCG/ACT NA SUSP
2.0000 | Freq: Every day | NASAL | Status: DC
  Administered 2011-11-20: 2 via NASAL
  Filled 2011-11-20: qty 16

## 2011-11-20 MED ORDER — ROCURONIUM BROMIDE 100 MG/10ML IV SOLN
INTRAVENOUS | Status: DC | PRN
  Administered 2011-11-20 (×2): 10 mg via INTRAVENOUS
  Administered 2011-11-20: 50 mg via INTRAVENOUS
  Administered 2011-11-20: 10 mg via INTRAVENOUS

## 2011-11-20 MED ORDER — SODIUM CHLORIDE 0.9 % IV SOLN
INTRAVENOUS | Status: AC
  Filled 2011-11-20: qty 500

## 2011-11-20 MED ORDER — HYDROMORPHONE HCL PF 1 MG/ML IJ SOLN: 0.2500 mg | INTRAMUSCULAR | Status: DC | PRN

## 2011-11-20 MED ORDER — SODIUM CHLORIDE 0.9 % IJ SOLN
3.0000 mL | Freq: Two times a day (BID) | INTRAMUSCULAR | Status: DC
  Administered 2011-11-20 (×2): 3 mL via INTRAVENOUS

## 2011-11-20 MED ORDER — HETASTARCH-ELECTROLYTES 6 % IV SOLN
INTRAVENOUS | Status: DC | PRN
  Administered 2011-11-20: 09:00:00 via INTRAVENOUS

## 2011-11-20 MED ORDER — ACETAMINOPHEN 325 MG PO TABS: 650.0000 mg | ORAL_TABLET | ORAL | Status: DC | PRN

## 2011-11-20 MED ORDER — 0.9 % SODIUM CHLORIDE (POUR BTL) OPTIME
TOPICAL | Status: DC | PRN
  Administered 2011-11-20: 1000 mL

## 2011-11-20 MED ORDER — GLYCOPYRROLATE 0.2 MG/ML IJ SOLN
INTRAMUSCULAR | Status: DC | PRN
  Administered 2011-11-20: 0.4 mg via INTRAVENOUS

## 2011-11-20 MED ORDER — ACETAMINOPHEN 650 MG RE SUPP: 650.0000 mg | RECTAL | Status: DC | PRN

## 2011-11-20 MED ORDER — SODIUM CHLORIDE 0.9 % IR SOLN
Status: DC | PRN
  Administered 2011-11-20 (×2)

## 2011-11-20 MED ORDER — PHENYLEPHRINE HCL 10 MG/ML IJ SOLN
INTRAMUSCULAR | Status: DC | PRN
  Administered 2011-11-20: 40 ug via INTRAVENOUS
  Administered 2011-11-20 (×4): 80 ug via INTRAVENOUS
  Administered 2011-11-20 (×3): 40 ug via INTRAVENOUS
  Administered 2011-11-20: 80 ug via INTRAVENOUS

## 2011-11-20 MED ORDER — ONDANSETRON HCL 4 MG/2ML IJ SOLN
4.0000 mg | INTRAMUSCULAR | Status: DC | PRN
  Administered 2011-11-20: 4 mg via INTRAVENOUS
  Filled 2011-11-20: qty 2

## 2011-11-20 MED ORDER — THROMBIN 5000 UNITS EX SOLR
OROMUCOSAL | Status: DC | PRN
  Administered 2011-11-20 (×2): via TOPICAL

## 2011-11-20 MED ORDER — ONDANSETRON HCL 4 MG/2ML IJ SOLN
INTRAMUSCULAR | Status: DC | PRN
  Administered 2011-11-20: 4 mg via INTRAVENOUS

## 2011-11-20 MED ORDER — SUCCINYLCHOLINE CHLORIDE 20 MG/ML IJ SOLN
INTRAMUSCULAR | Status: DC | PRN
  Administered 2011-11-20: 100 mg via INTRAVENOUS

## 2011-11-20 MED ORDER — PHENYLEPHRINE HCL 10 MG/ML IJ SOLN
10.0000 mg | INTRAVENOUS | Status: DC | PRN
  Administered 2011-11-20: 15 ug/min via INTRAVENOUS

## 2011-11-20 MED ORDER — MIDAZOLAM HCL 5 MG/5ML IJ SOLN
INTRAMUSCULAR | Status: DC | PRN
  Administered 2011-11-20: 2 mg via INTRAVENOUS

## 2011-11-20 MED ORDER — EPHEDRINE SULFATE 50 MG/ML IJ SOLN
INTRAMUSCULAR | Status: DC | PRN
  Administered 2011-11-20: 10 mg via INTRAVENOUS

## 2011-11-20 MED ORDER — PANTOPRAZOLE SODIUM 40 MG PO TBEC
40.0000 mg | DELAYED_RELEASE_TABLET | Freq: Every day | ORAL | Status: DC
  Administered 2011-11-20: 40 mg via ORAL
  Filled 2011-11-20: qty 1

## 2011-11-20 MED ORDER — BACITRACIN ZINC 500 UNIT/GM EX OINT
TOPICAL_OINTMENT | CUTANEOUS | Status: DC | PRN
  Administered 2011-11-20: 1 via TOPICAL

## 2011-11-20 MED ORDER — ALUM & MAG HYDROXIDE-SIMETH 200-200-20 MG/5ML PO SUSP: 30.0000 mL | Freq: Four times a day (QID) | ORAL | Status: DC | PRN

## 2011-11-20 MED ORDER — IRBESARTAN 75 MG PO TABS
75.0000 mg | ORAL_TABLET | Freq: Every day | ORAL | Status: DC
  Filled 2011-11-20: qty 1

## 2011-11-20 MED ORDER — HYDROMORPHONE HCL PF 1 MG/ML IJ SOLN
0.5000 mg | INTRAMUSCULAR | Status: DC | PRN
  Administered 2011-11-20: 1 mg via INTRAVENOUS
  Filled 2011-11-20: qty 1

## 2011-11-20 SURGICAL SUPPLY — 96 items
BAG DECANTER FOR FLEXI CONT (MISCELLANEOUS) ×6 IMPLANT
BANDAGE ELASTIC 3 VELCRO ST LF (GAUZE/BANDAGES/DRESSINGS) IMPLANT
BANDAGE GAUZE ELAST BULKY 4 IN (GAUZE/BANDAGES/DRESSINGS) IMPLANT
BENZOIN TINCTURE PRP APPL 2/3 (GAUZE/BANDAGES/DRESSINGS) IMPLANT
BLADE SURG 15 STRL LF DISP TIS (BLADE) ×2 IMPLANT
BLADE SURG 15 STRL SS (BLADE) ×1
BRUSH SCRUB EZ PLAIN DRY (MISCELLANEOUS) ×6 IMPLANT
BUR MATCHSTICK NEURO 3.0 LAGG (BURR) ×3 IMPLANT
CANISTER SUCTION 2500CC (MISCELLANEOUS) ×3 IMPLANT
CLOTH BEACON ORANGE TIMEOUT ST (SAFETY) ×3 IMPLANT
CONT SPEC 4OZ CLIKSEAL STRL BL (MISCELLANEOUS) ×3 IMPLANT
CORDS BIPOLAR (ELECTRODE) ×3 IMPLANT
DECANTER SPIKE VIAL GLASS SM (MISCELLANEOUS) ×3 IMPLANT
DERMABOND ADHESIVE PROPEN (GAUZE/BANDAGES/DRESSINGS) ×1
DERMABOND ADVANCED (GAUZE/BANDAGES/DRESSINGS)
DERMABOND ADVANCED .7 DNX12 (GAUZE/BANDAGES/DRESSINGS) IMPLANT
DERMABOND ADVANCED .7 DNX6 (GAUZE/BANDAGES/DRESSINGS) ×2 IMPLANT
DRAIN JACKSON PRATT 1/4 1325 (MISCELLANEOUS) ×3 IMPLANT
DRAPE C-ARM 42X72 X-RAY (DRAPES) ×6 IMPLANT
DRAPE EXTREMITY T 121X128X90 (DRAPE) ×3 IMPLANT
DRAPE LAPAROTOMY 100X72 PEDS (DRAPES) ×3 IMPLANT
DRAPE MICROSCOPE ZEISS OPMI (DRAPES) ×3 IMPLANT
DRAPE POUCH INSTRU U-SHP 10X18 (DRAPES) ×3 IMPLANT
DRAPE PROXIMA HALF (DRAPES) ×3 IMPLANT
DRILL BIT (BIT) ×3 IMPLANT
DRSG EMULSION OIL 3X3 NADH (GAUZE/BANDAGES/DRESSINGS) ×3 IMPLANT
DRSG OPSITE 4X5.5 SM (GAUZE/BANDAGES/DRESSINGS) ×3 IMPLANT
ELECT COATED BLADE 2.86 ST (ELECTRODE) ×3 IMPLANT
ELECT REM PT RETURN 9FT ADLT (ELECTROSURGICAL) ×3
ELECTRODE REM PT RTRN 9FT ADLT (ELECTROSURGICAL) ×2 IMPLANT
EVACUATOR SILICONE 100CC (DRAIN) ×3 IMPLANT
GAUZE SPONGE 4X4 16PLY XRAY LF (GAUZE/BANDAGES/DRESSINGS) ×9 IMPLANT
GLOVE BIO SURGEON STRL SZ 6.5 (GLOVE) IMPLANT
GLOVE BIO SURGEON STRL SZ7 (GLOVE) IMPLANT
GLOVE BIO SURGEON STRL SZ7.5 (GLOVE) IMPLANT
GLOVE BIO SURGEON STRL SZ8 (GLOVE) ×6 IMPLANT
GLOVE BIO SURGEON STRL SZ8.5 (GLOVE) IMPLANT
GLOVE BIOGEL M 8.0 STRL (GLOVE) IMPLANT
GLOVE ECLIPSE 6.5 STRL STRAW (GLOVE) IMPLANT
GLOVE ECLIPSE 7.0 STRL STRAW (GLOVE) IMPLANT
GLOVE ECLIPSE 7.5 STRL STRAW (GLOVE) ×3 IMPLANT
GLOVE ECLIPSE 8.0 STRL XLNG CF (GLOVE) IMPLANT
GLOVE ECLIPSE 8.5 STRL (GLOVE) IMPLANT
GLOVE EXAM NITRILE LRG STRL (GLOVE) IMPLANT
GLOVE EXAM NITRILE MD LF STRL (GLOVE) ×6 IMPLANT
GLOVE EXAM NITRILE XL STR (GLOVE) IMPLANT
GLOVE EXAM NITRILE XS STR PU (GLOVE) IMPLANT
GLOVE INDICATOR 6.5 STRL GRN (GLOVE) IMPLANT
GLOVE INDICATOR 7.0 STRL GRN (GLOVE) IMPLANT
GLOVE INDICATOR 7.5 STRL GRN (GLOVE) IMPLANT
GLOVE INDICATOR 8.0 STRL GRN (GLOVE) IMPLANT
GLOVE INDICATOR 8.5 STRL (GLOVE) ×6 IMPLANT
GLOVE OPTIFIT SS 8.0 STRL (GLOVE) IMPLANT
GLOVE SURG SS PI 6.5 STRL IVOR (GLOVE) IMPLANT
GOWN BRE IMP SLV AUR LG STRL (GOWN DISPOSABLE) ×3 IMPLANT
GOWN BRE IMP SLV AUR XL STRL (GOWN DISPOSABLE) ×9 IMPLANT
GOWN STRL REIN 2XL LVL4 (GOWN DISPOSABLE) ×6 IMPLANT
HAND ALUMI LG (SOFTGOODS) ×3 IMPLANT
HEAD HALTER (SOFTGOODS) ×3 IMPLANT
HEMOSTAT POWDER KIT SURGIFOAM (HEMOSTASIS) ×6 IMPLANT
KIT BASIN OR (CUSTOM PROCEDURE TRAY) ×3 IMPLANT
KIT ROOM TURNOVER OR (KITS) ×3 IMPLANT
MARKER SKIN DUAL TIP RULER LAB (MISCELLANEOUS) ×3 IMPLANT
NEEDLE HYPO 18GX1.5 BLUNT FILL (NEEDLE) IMPLANT
NEEDLE HYPO 25X1 1.5 SAFETY (NEEDLE) ×3 IMPLANT
NEEDLE SPNL 20GX3.5 QUINCKE YW (NEEDLE) ×3 IMPLANT
NS IRRIG 1000ML POUR BTL (IV SOLUTION) ×3 IMPLANT
PACK LAMINECTOMY NEURO (CUSTOM PROCEDURE TRAY) ×3 IMPLANT
PACK SURGICAL SETUP 50X90 (CUSTOM PROCEDURE TRAY) ×3 IMPLANT
PAD ARMBOARD 7.5X6 YLW CONV (MISCELLANEOUS) ×9 IMPLANT
PLATE 4 77.5XLCK NS SPNE CVD (Plate) ×2 IMPLANT
PLATE 4 ATLANTIS TRANS (Plate) ×1 IMPLANT
PUTTY BONE DBX 2.5 MIS (Bone Implant) ×3 IMPLANT
RUBBERBAND STERILE (MISCELLANEOUS) ×6 IMPLANT
SCREW ST FIX 4 ATL 3120213 (Screw) ×30 IMPLANT
SPACER ACDF SM LORDOTIC 7 (Spacer) ×3 IMPLANT
SPACER COLONIAL 7X14X12 (Spacer) ×6 IMPLANT
SPACER COLONIAL LGE 8MM 7DEG (Spacer) ×3 IMPLANT
SPONGE GAUZE 4X4 12PLY (GAUZE/BANDAGES/DRESSINGS) ×12 IMPLANT
SPONGE INTESTINAL PEANUT (DISPOSABLE) ×3 IMPLANT
SPONGE SURGIFOAM ABS GEL 100 (HEMOSTASIS) ×3 IMPLANT
SPONGE SURGIFOAM ABS GEL SZ50 (HEMOSTASIS) IMPLANT
STOCKINETTE 4X48 STRL (DRAPES) ×3 IMPLANT
STRIP CLOSURE SKIN 1/2X4 (GAUZE/BANDAGES/DRESSINGS) ×3 IMPLANT
SUT ETHILON 3 0 PS 1 (SUTURE) ×3 IMPLANT
SUT VIC AB 3-0 SH 8-18 (SUTURE) ×6 IMPLANT
SUT VICRYL 4-0 PS2 18IN ABS (SUTURE) ×3 IMPLANT
SYR 20ML ECCENTRIC (SYRINGE) ×3 IMPLANT
SYR BULB 3OZ (MISCELLANEOUS) ×3 IMPLANT
SYR CONTROL 10ML LL (SYRINGE) ×3 IMPLANT
TAPE CLOTH 4X10 WHT NS (GAUZE/BANDAGES/DRESSINGS) ×3 IMPLANT
TOWEL OR 17X24 6PK STRL BLUE (TOWEL DISPOSABLE) ×3 IMPLANT
TOWEL OR 17X26 10 PK STRL BLUE (TOWEL DISPOSABLE) ×6 IMPLANT
TRAP SPECIMEN MUCOUS 40CC (MISCELLANEOUS) ×3 IMPLANT
UNDERPAD 30X30 INCONTINENT (UNDERPADS AND DIAPERS) ×3 IMPLANT
WATER STERILE IRR 1000ML POUR (IV SOLUTION) ×3 IMPLANT

## 2011-11-20 NOTE — Op Note (Signed)
Preoperative diagnosis: Left carpal tunnel syndrome  Postoperative diagnosis: Same  Procedure: Left carpal tunnel release  Anesthesia: Gen.  Surgeon: Jillyn Hidden Illyana Schorsch  EBL: Minimal  History of present illness: This is a 61 year old female who presents with cervical spondylosis and left syndrome documented by EMG he should failed conservative treatment with bracing she was recommended left carpal tunnel release, 6 and benefits of the operation were explained to the patient as well as perioperative course and expectations of outcome alternatives of surgery she understood and agreed to proceed forward.  Operative procedure: After completion of the anterior cervical discectomies and fusion patient was repositioned left hand was prepped and draped in a sterile fashion the incision was drawn out from distal crease the wrist and across the palmar crease proximal and 4 cm long towards the middle finger the after this was anesthetized with 1% lidocaine with epi and incision was made and suffered significant retractor was placed the the transverse carpal ligament was medially identified sequentially divided in layers until the epineurium of the median nerve was visualized. This was then divided using hemostat a plane was developed in size sharply decompressed the median nerve appeared this is carried out proximally and distally until the median nerve was completely decompressed the carpal tunnel. It was a copious irrigated meticulous hemostasis was maintained and the wounds closed in layers with a running nylon in the skin bacitracin Adaptic fluffs Kerlix roll and Ace wrap the patient recovered in stable condition. R. counts and sponge counts were correct per the nurses.

## 2011-11-20 NOTE — Anesthesia Preprocedure Evaluation (Addendum)
Anesthesia Evaluation  Patient identified by MRN, date of birth, ID band Patient awake    Reviewed: Allergy & Precautions, H&P , NPO status , Patient's Chart, lab work & pertinent test results  History of Anesthesia Complications (+) PONV  Airway Mallampati: I TM Distance: >3 FB Neck ROM: Full    Dental  (+) Edentulous Upper and Lower Dentures   Pulmonary neg pulmonary ROS,  breath sounds clear to auscultation        Cardiovascular hypertension, Pt. on medications and Pt. on home beta blockers Rhythm:Regular Rate:Normal     Neuro/Psych    GI/Hepatic Neg liver ROS, GERD-  Medicated,Pt states she sleeps w/several pillows at night due to bad reflux   Endo/Other  negative endocrine ROS  Renal/GU negative Renal ROS     Musculoskeletal   Abdominal   Peds  Hematology   Anesthesia Other Findings   Reproductive/Obstetrics                          Anesthesia Physical Anesthesia Plan  ASA: III  Anesthesia Plan: General   Post-op Pain Management:    Induction: Intravenous  Airway Management Planned: Oral ETT  Additional Equipment:   Intra-op Plan:   Post-operative Plan:   Informed Consent: I have reviewed the patients History and Physical, chart, labs and discussed the procedure including the risks, benefits and alternatives for the proposed anesthesia with the patient or authorized representative who has indicated his/her understanding and acceptance.     Plan Discussed with:   Anesthesia Plan Comments:        Anesthesia Quick Evaluation

## 2011-11-20 NOTE — Plan of Care (Signed)
Problem: Consults Goal: Diagnosis - Spinal Surgery Outcome: Completed/Met Date Met:  11/20/11 Cervical Spine Fusion Lt.Carpal Tunnel Release

## 2011-11-20 NOTE — Op Note (Signed)
Preoperative diagnosis: Cervical spondylosis with stenosis at C3-4, C4-5, C5-6, and C6-7. With severe left greater right C4 C5-C6 and C7 radiculopathies, from severe biforaminal stenosis at these levels. And left carpal tunnel syndrome  Postoperative diagnosis: Same  Procedure: Anterior cervical discectomies and fusion at C3-4, C4-5, C5-6, and C6-7 using globus peek cages packed with locally harvested autograft mixed with DBX mix, anterior cervical plating using the Atlantis translational plating system from C3-C7 with 10 fixed angle 13 mm screws  Surgeon: Jillyn Hidden Javion Holmer  Assistant: Colon Branch  Anesthesia: Gen.  EBL: Minimal  History of present illness: Patient is a very pleasant 61 year old female whose had progressive worsening neck and bilateral arm pain worse on the left with severe pain rating to her left shoulder and left arm consistent with C4 C5-C6 and C7 nerve root pattern. Workup and imaging revealed severe cervical stenosis and spondylosis with bifrontal stenosis at all these levels. The patient was recommended after failure conservative treatment anterior cervical discectomies and fusion at C3-4, C4-5, C5-6, and C6-7. The risks and benefits of the operation as well as perioperative course expectations of outcome and alternatives of surgery were also the patient she agreed to proceed forward.  Operative procedure: Patient was brought into the or was induced under general anesthesia positioned supine the neck in slight extension in 5 pounds of halter traction right side of her neck was prepped And draped in routine sterile fashion. Preoperative x-ray localize the appropriate level so a curvilinear incision was made just up midline to the anterior border of the sternomastoid fascia layer of the platysma was dissected and divided longitudinally. The avascular plane to sternomastoid and strap muscles was developed and the pre-fashioned professes acid with Kitners. Interoperative x-ray identified  the appropriate level so in the lungs close affected laterally at that level and although the spaces around it to include C3-4, C4-5, C5-6, C6-7 the self-retaining retractors placed and attention was first taken at C5-6 and C6-7. Large interosseous of did not flexor rongeur and a 3 minute Kerrison punch both the space and scraped the anterior margin anus removed suture rongeurs in the disc space was drilled down the posterior osteophyte complexes with a high-speed drill capturing the bone shavings in a mucous trap. Then after under microscopic illumination aggressive abutting both endplates initially at C6 and was carried out is a large posterior spur coming off the C6 vertebral body this was aggressively and mid-March across laterally and decompressed and both C7 nerve roots. There is marked uncinate hypertrophy causing severe proximal status post a 7 a recesses all unroofed. He and discectomy at C6-7 there is no more central or foraminal disc stenosis and the C7 nerve release except the nerve hook out the foramen. This is intact Gelfoam to second C5-6. A similar fashion C5-6 was drilled down aggressive abutting both endplates was carried out a large spur coming off the C5 vertebral body and displacing the left C6 nerve root was also unroofed both C6 nerve roots, social pedicle and widely decompressed the each foramen easily accepted nerve root after decompression. S1 and viable template to further decompress the central canal. Then 2-7 mm peek cage packable local are graft mixed DBX mix and then inserted C5-6 C6-7 proximal a 2 mm deep to the anterior vertebra body line. Then retractors are position this taken C3-4 C4-5 and a similar fashion C4-5 in C4 were prepared both the space is drilled down capturing the bone shavings in a mucous trap at C4-5 there is profound stenosis on  both C5 nerve roots aggressive decompressive foraminotomies were performed taking care to minimize the manipulation of the proximal C5  nerve root however there is marked uncinate hypertrophy causing severe stenosis approximately. After all this was unroofed and both foramina were widely patent aggressive abutting both endplates decompress the canal as well. At C3-4 similar fashion the limb was a spur was central and slightly leftward are adequate decompression here to 7 mm grafts were also inserted at C3-4 and C4-5 and after all interbody work been done to second plate size and the plate was sized and 10 fixed angle 13 mm screws are placed all screws excellent purchase locking mechanism was engaged wounds and copious irrigated meticulous in space was maintained in a J-P drain was placed. Then the posterior fluoroscopy confirmed good position of plate screws and implants the wounds and closed in layers with after Vicryl the platysma and a running 4 subcuticular. Benzoin shift applied patient recovered in stable condition. Patient was repositioned for left carpal tunnel release The

## 2011-11-20 NOTE — Anesthesia Postprocedure Evaluation (Signed)
  Anesthesia Post-op Note  Patient: Valerie [name]  Procedure(s) Performed: Procedure(s) (LRB): ANTERIOR CERVICAL DECOMPRESSION/DISCECTOMY FUSION 4 LEVELS (N/A) CARPAL TUNNEL RELEASE (Left)  Patient Location: PACU  Anesthesia Type: General  Level of Consciousness: awake  Airway and Oxygen Therapy: Patient Spontanous Breathing  Post-op Pain: mild  Post-op Assessment: Post-op Vital signs reviewed  Post-op Vital Signs: Reviewed  Complications: No apparent anesthesia complications

## 2011-11-20 NOTE — Transfer of Care (Signed)
Immediate Anesthesia Transfer of Care Note  Patient: Valerie [name]  Procedure(s) Performed: Procedure(s) (LRB): ANTERIOR CERVICAL DECOMPRESSION/DISCECTOMY FUSION 4 LEVELS (N/A) CARPAL TUNNEL RELEASE (Left)  Patient Location: PACU  Anesthesia Type: General  Level of Consciousness: awake, alert , oriented and patient cooperative  Airway & Oxygen Therapy: Patient Spontanous Breathing and Patient connected to face mask oxygen  Post-op Assessment: Report given to PACU RN, Post -op Vital signs reviewed and stable and Patient moving all extremities X 4  Post vital signs: Reviewed and stable  Complications: No apparent anesthesia complications

## 2011-11-20 NOTE — H&P (Signed)
Valerie [name] is an 61 y.o. female.   Chief Complaint: Neck and bilateral arm pain worse on the left HPI: Patient is a very pleasant 61 year old female whose had progressive worsening neck pain with radiation to both arms are predominantly her left arm shoulder down her forearm and the first of the fingers of her left hand numbness tingling in the left and would EMG documentation of median nerve entrapment at the wrist bilaterally. Her right carpal tunnel released several weeks ago but now her pain is progressively worsening neck is refractory to all forms of conservative and inflammatories physical therapy and steroids MRI scan imaging studies showed severe cervical stenosis spinal cord compression bifrontal stenosis at C3-4 C4-5 C5-6 C6-7 due to patient takes her treatment progression of clinical syndrome and imaging findings patient recommended anterior cervical discectomies and fusion at these levels as well as left correlates I extensively reviewed all the risks and benefits of both operations as well as perioperative course and expectations of outcome and alternatives of surgery she understands and agrees to proceed forward.  Past Medical History  Diagnosis Date  . Hypertension   . Acid reflux   . Hernia   . Menorrhagia   . Osteopenia   . Vitamin d deficiency   . Atrophic vaginitis   . Adenocarcinoma of the endometrium/uterus 12/04/2000  . Vertigo   . PONV (postoperative nausea and vomiting)     Past Surgical History  Procedure Date  . Tubal ligation   . Cholecystectomy 1997  . Cardiac catheterization 2006  . Hysteroscopy 11/15/2000    D & C, resection of endometrial polyps  . Dilation and curettage of uterus     11/15/2000  . Abdominal hysterectomy 12/04/2000    also bilateral salpingo-oophorectomy  . Oophorectomy 12/04/2000    bilateral salpingo-oophorectomy done with TAH    Family History  Problem Relation Age of Onset  . Cancer Father     prostate cancer  . Hypertension  Father   . Hypertension Sister   . Diabetes Brother   . Heart disease Brother   . Hypertension Brother   . Diabetes Brother   . Heart disease Brother    Social History:  reports that she has never smoked. She has never used smokeless tobacco. She reports that she does not drink alcohol. Her drug history not on file.  Allergies: No Known Allergies  Medications Prior to Admission  Medication Sig Dispense Refill  . aspirin EC 81 MG tablet Take 81 mg by mouth daily.      Marland Kitchen atorvastatin (LIPITOR) 10 MG tablet Take 10 mg by mouth daily.        . Calcium Carbonate-Vit D-Min (CALTRATE 600+D PLUS PO) Take 1 tablet by mouth 2 (two) times daily.        Marland Kitchen esomeprazole (NEXIUM) 40 MG capsule Take 40 mg by mouth daily before breakfast.        . fluticasone (FLONASE) 50 MCG/ACT nasal spray Place 2 sprays into the nose daily.        . metoprolol (TOPROL-XL) 200 MG 24 hr tablet Take 200 mg by mouth daily.      Marland Kitchen telmisartan (MICARDIS) 40 MG tablet Take 40 mg by mouth daily.        . Vitamin D, Ergocalciferol, (DRISDOL) 50000 UNITS CAPS Take 50,000 Units by mouth every 7 (seven) days. Take on Thursdays.        No results found for this or any previous visit (from the past 48 hour(s)). No  results found.  Review of Systems  Constitutional: Negative.   HENT: Positive for neck pain.   Eyes: Negative.   Respiratory: Negative.   Cardiovascular: Negative.   Gastrointestinal: Negative.   Genitourinary: Negative.   Musculoskeletal: Positive for joint pain and falls.  Skin: Negative.   Neurological: Positive for tingling and sensory change.  Endo/Heme/Allergies: Negative.   Psychiatric/Behavioral: Negative.     Blood pressure 126/84, pulse 69, temperature 98 F (36.7 C), temperature source Oral, resp. rate 18, SpO2 98.00%. Physical Exam  Constitutional: She is oriented to person, place, and time. She appears well-developed.  Eyes: Pupils are equal, round, and reactive to light.  Neck: Normal range  of motion.  Respiratory: Breath sounds normal.  GI: Soft.  Musculoskeletal: Normal range of motion.  Neurological: She is alert and oriented to person, place, and time. She has normal strength. GCS eye subscore is 4. GCS verbal subscore is 5. GCS motor subscore is 6.  Reflex Scores:      Tricep reflexes are 1+ on the right side and 1+ on the left side.      Bicep reflexes are 1+ on the right side and 1+ on the left side.      Brachioradialis reflexes are 1+ on the right side and 1+ on the left side.      Patellar reflexes are 1+ on the right side and 1+ on the left side.      Achilles reflexes are 1+ on the right side and 1+ on the left side.      Strength is 5 out of 5 in her deltoids and biceps chiseled or weakness in her left tricep 4+ out of 5 root wrist flexion extension and intrinsics are all 5 out of 5     Assessment/Plan 61 year old female presents for ACDF at C3-4, C4-5, C5-6, and C6-7, and a left carpal tunnel release.  Marguetta Windish P 11/20/2011, 7:25 AM

## 2011-11-20 NOTE — OR Nursing (Signed)
End time for Procedure 1 ACDF-11:41.Start Time for Procedure 2-Carpel Tunnel Release-12:02 DDay RN

## 2011-11-21 MED ORDER — OXYCODONE-ACETAMINOPHEN 5-325 MG PO TABS: 1.0000 | ORAL_TABLET | ORAL | Status: AC | PRN

## 2011-11-21 MED ORDER — CYCLOBENZAPRINE HCL 10 MG PO TABS: 10.0000 mg | ORAL_TABLET | Freq: Three times a day (TID) | ORAL | Status: AC | PRN

## 2011-11-21 NOTE — Discharge Summary (Signed)
  Physician Discharge Summary  Patient ID: Valerie [name] MRN: [id number] DOB/AGE: 08/07/1950 61 y.o.  Admit date: 11/20/2011 Discharge date: 11/21/2011  Admission Diagnoses: Cervical spondylosis with stenosis at C3-4 C4-5 C5-6 C6-7 and left carpal tunnel syndrome  Discharge Diagnoses: Same Active Problems:  * No active hospital problems. *    Discharged Condition: good  Hospital Course: Patient was admitted hospital underwent an anterior cervical discectomy and fusion at C3-4, C4-5 C5, C5-6, C6-7 and a left carpal tunnel release postoperatively patient did very well went to the recovery room and then the floor convalesced well the floor was angling and voiding spontaneously by postoperative day 1 was he'll be discharged home. Patient be discharged home on oxycodone and Flexeril followup in 2 weeks.  Consults: Significant Diagnostic Studies: Treatments: Anterior cervical discectomies and fusion at C4-5 C3-4 C5-6 and C6-7 and a left carpal tunnel release Discharge Exam: Blood pressure 107/69, pulse 95, temperature 98.8 F (37.1 C), temperature source Oral, resp. rate 16, SpO2 94.00%. Strength out of 5 wound clean and dry  Disposition: Home   Medication List  As of 11/21/2011  7:37 AM   TAKE these medications         aspirin EC 81 MG tablet   Take 81 mg by mouth daily.      atorvastatin 10 MG tablet   Commonly known as: LIPITOR   Take 10 mg by mouth daily.      CALTRATE 600+D PLUS PO   Take 1 tablet by mouth 2 (two) times daily.      cyclobenzaprine 10 MG tablet   Commonly known as: FLEXERIL   Take 1 tablet (10 mg total) by mouth 3 (three) times daily as needed for muscle spasms.      esomeprazole 40 MG capsule   Commonly known as: NEXIUM   Take 40 mg by mouth daily before breakfast.      fluticasone 50 MCG/ACT nasal spray   Commonly known as: FLONASE   Place 2 sprays into the nose daily.      metoprolol 200 MG 24 hr tablet   Commonly known as: TOPROL-XL   Take 200 mg  by mouth daily.      oxyCODONE-acetaminophen 5-325 MG per tablet   Commonly known as: PERCOCET   Take 1-2 tablets by mouth every 4 (four) hours as needed.      telmisartan 40 MG tablet   Commonly known as: MICARDIS   Take 40 mg by mouth daily.      Vitamin D (Ergocalciferol) 50000 UNITS Caps   Commonly known as: DRISDOL   Take 50,000 Units by mouth every 7 (seven) days. Take on Thursdays.             Signed: Sanda Dejoy P 11/21/2011, 7:37 AM

## 2011-11-21 NOTE — Progress Notes (Signed)
Subjective: Patient reports Doing well no arm pain numbness in her hands is improved  Objective: Vital signs in last 24 hours: Temp:  [97 F (36.1 C)-98.8 F (37.1 C)] 98.8 F (37.1 C) (06/25 0349) Pulse Rate:  [85-99] 95  (06/25 0349) Resp:  [10-40] 16  (06/25 0349) BP: (74-123)/(41-78) 107/69 mmHg (06/25 0349) SpO2:  [92 %-96 %] 94 % (06/25 0349) FiO2 (%):  [2 %] 2 % (06/24 1610)  Intake/Output from previous day: 06/24 0701 - 06/25 0700 In: 4005 [P.O.:360; I.V.:2200; IV Piggyback:500] Out: 2085 [Urine:1875; Emesis/NG output:100; Drains:35; Blood:75] Intake/Output this shift:    strength is 5 out of 5 wound is clean and dry  Lab Results: No results found for this basename: WBC:2,HGB:2,HCT:2,PLT:2 in the last 72 hours BMET No results found for this basename: NA:2,K:2,CL:2,CO2:2,GLUCOSE:2,BUN:2,CREATININE:2,CALCIUM:2 in the last 72 hours  Studies/Results: Dg Cervical Spine 2-3 Views  11/20/2011  *RADIOLOGY REPORT*  Clinical Data: Neck pain  CERVICAL SPINE - 2-3 VIEW,DG C-ARM 1-60 MIN  Comparison: 08/31/2011  Findings: C-arm films document C3-7 ACDF. Limited visualization of the inferior aspect of the construct.  No adverse features.  IMPRESSION: As above.  Original Report Authenticated By: Elsie Stain, M.D.   Dg C-arm 1-60 Min  11/20/2011  *RADIOLOGY REPORT*  Clinical Data: Neck pain  CERVICAL SPINE - 2-3 VIEW,DG C-ARM 1-60 MIN  Comparison: 08/31/2011  Findings: C-arm films document C3-7 ACDF. Limited visualization of the inferior aspect of the construct.  No adverse features.  IMPRESSION: As above.  Original Report Authenticated By: Elsie Stain, M.D.    Assessment/Plan: Discharge him  LOS: 1 day     Valerie [name] P 11/21/2011, 7:34 AM

## 2011-11-21 NOTE — Discharge Instructions (Signed)
No lifting no bending no twisting no driving or riding a car unless unless coming to see me. keep the incision clean and dry.  She may shower take off the outer dressing 2 days leave the Steri-Strips intact

## 2011-11-23 ENCOUNTER — Encounter (HOSPITAL_COMMUNITY): Payer: Self-pay | Admitting: Neurosurgery

## 2012-05-06 ENCOUNTER — Ambulatory Visit: Payer: Self-pay | Admitting: Internal Medicine

## 2012-06-29 ENCOUNTER — Emergency Department: Payer: Self-pay | Admitting: Emergency Medicine

## 2012-06-29 LAB — BASIC METABOLIC PANEL
Anion Gap: 7 (ref 7–16)
BUN: 35 mg/dL — ABNORMAL HIGH (ref 7–18)
Calcium, Total: 8.8 mg/dL (ref 8.5–10.1)
Chloride: 104 mmol/L (ref 98–107)
Co2: 25 mmol/L (ref 21–32)
Creatinine: 1.29 mg/dL (ref 0.60–1.30)
EGFR (African American): 52 — ABNORMAL LOW
EGFR (Non-African Amer.): 45 — ABNORMAL LOW
Glucose: 105 mg/dL — ABNORMAL HIGH (ref 65–99)
Osmolality: 280 (ref 275–301)
Potassium: 3.9 mmol/L (ref 3.5–5.1)
Sodium: 136 mmol/L (ref 136–145)

## 2012-06-29 LAB — CBC
HCT: 36.5 % (ref 35.0–47.0)
HGB: 12 g/dL (ref 12.0–16.0)
MCH: 31.7 pg (ref 26.0–34.0)
MCHC: 33 g/dL (ref 32.0–36.0)
MCV: 96 fL (ref 80–100)
Platelet: 252 10*3/uL (ref 150–440)
RBC: 3.8 10*6/uL (ref 3.80–5.20)
RDW: 14.7 % — ABNORMAL HIGH (ref 11.5–14.5)
WBC: 6.2 10*3/uL (ref 3.6–11.0)

## 2012-06-29 LAB — TROPONIN I
Troponin-I: <0.02 ng/mL
Troponin-I: <0.02 ng/mL

## 2012-06-29 LAB — CK TOTAL AND CKMB (NOT AT ARMC)
CK, Total: 179 U/L (ref 21–215)
CK-MB: 2.3 ng/mL (ref 0.5–3.6)

## 2012-06-30 LAB — CK TOTAL AND CKMB (NOT AT ARMC)
CK, Total: 230 U/L — ABNORMAL HIGH (ref 21–215)
CK-MB: 1.9 ng/mL (ref 0.5–3.6)

## 2012-08-28 ENCOUNTER — Encounter (INDEPENDENT_AMBULATORY_CARE_PROVIDER_SITE_OTHER): Payer: BC Managed Care – PPO | Admitting: *Deleted

## 2012-08-28 DIAGNOSIS — M79609 Pain in unspecified limb: Secondary | ICD-10-CM

## 2012-10-22 ENCOUNTER — Ambulatory Visit: Payer: Self-pay | Admitting: Internal Medicine

## 2013-03-18 ENCOUNTER — Other Ambulatory Visit: Payer: Self-pay | Admitting: Neurosurgery

## 2013-03-18 DIAGNOSIS — M545 Low back pain, unspecified: Secondary | ICD-10-CM

## 2013-03-28 ENCOUNTER — Ambulatory Visit
Admission: RE | Admit: 2013-03-28 | Discharge: 2013-03-28 | Disposition: A | Discharge status: Home / Self Care | Payer: BC Managed Care – PPO | Source: Ambulatory Visit | Attending: Neurosurgery | Admitting: Neurosurgery

## 2013-03-28 DIAGNOSIS — M545 Low back pain, unspecified: Secondary | ICD-10-CM

## 2013-07-31 ENCOUNTER — Emergency Department: Payer: Self-pay | Admitting: Emergency Medicine

## 2013-07-31 LAB — CBC
HCT: 38.1 % (ref 35.0–47.0)
HGB: 13.3 g/dL (ref 12.0–16.0)
MCH: 33.2 pg (ref 26.0–34.0)
MCHC: 34.9 g/dL (ref 32.0–36.0)
MCV: 95 fL (ref 80–100)
Platelet: 225 10*3/uL (ref 150–440)
RBC: 4.01 10*6/uL (ref 3.80–5.20)
RDW: 14 % (ref 11.5–14.5)
WBC: 6.5 10*3/uL (ref 3.6–11.0)

## 2013-07-31 LAB — BASIC METABOLIC PANEL
Anion Gap: 2 — ABNORMAL LOW (ref 7–16)
BUN: 15 mg/dL (ref 7–18)
Calcium, Total: 9 mg/dL (ref 8.5–10.1)
Chloride: 105 mmol/L (ref 98–107)
Co2: 31 mmol/L (ref 21–32)
Creatinine: 0.96 mg/dL (ref 0.60–1.30)
EGFR (African American): >60
EGFR (Non-African Amer.): >60
Glucose: 141 mg/dL — ABNORMAL HIGH (ref 65–99)
Osmolality: 279 (ref 275–301)
Potassium: 3.7 mmol/L (ref 3.5–5.1)
Sodium: 138 mmol/L (ref 136–145)

## 2013-07-31 LAB — TROPONIN I: Troponin-I: <0.02 ng/mL

## 2013-10-30 ENCOUNTER — Ambulatory Visit: Payer: Self-pay | Admitting: Nurse Practitioner

## 2013-11-05 ENCOUNTER — Ambulatory Visit: Payer: Self-pay | Admitting: Nurse Practitioner

## 2014-03-30 ENCOUNTER — Encounter (HOSPITAL_COMMUNITY): Payer: Self-pay | Admitting: Neurosurgery

## 2014-08-05 ENCOUNTER — Emergency Department: Payer: Self-pay | Admitting: Emergency Medicine

## 2014-08-31 ENCOUNTER — Encounter: Payer: Self-pay | Admitting: Primary Care

## 2014-08-31 ENCOUNTER — Ambulatory Visit (INDEPENDENT_AMBULATORY_CARE_PROVIDER_SITE_OTHER): Payer: BLUE CROSS/BLUE SHIELD | Admitting: Primary Care

## 2014-08-31 VITALS — BP 120/74 | HR 74 | Temp 98.0°F | Ht 66.0 in | Wt 147.8 lb

## 2014-08-31 DIAGNOSIS — E559 Vitamin D deficiency, unspecified: Secondary | ICD-10-CM | POA: Diagnosis not present

## 2014-08-31 DIAGNOSIS — I10 Essential (primary) hypertension: Secondary | ICD-10-CM

## 2014-08-31 DIAGNOSIS — K219 Gastro-esophageal reflux disease without esophagitis: Secondary | ICD-10-CM

## 2014-08-31 DIAGNOSIS — F411 Generalized anxiety disorder: Secondary | ICD-10-CM

## 2014-08-31 DIAGNOSIS — H8110 Benign paroxysmal vertigo, unspecified ear: Secondary | ICD-10-CM

## 2014-08-31 MED ORDER — OMEPRAZOLE 40 MG PO CPDR: 40.0000 mg | DELAYED_RELEASE_CAPSULE | Freq: Every day | ORAL | Status: DC

## 2014-08-31 MED ORDER — VITAMIN D (ERGOCALCIFEROL) 1.25 MG (50000 UNIT) PO CAPS: 50000.0000 [IU] | ORAL_CAPSULE | ORAL | Status: DC

## 2014-08-31 NOTE — Assessment & Plan Note (Signed)
Stable without medication. Episodes occur infrequently and resolve with rest.

## 2014-08-31 NOTE — Progress Notes (Signed)
Subjective:    Patient ID: Valerie [name], female    DOB: August 03, 1950, 64 y.o.   MRN: [id number]  HPI  [name] is a 64 year old female who presents today to establish care and discuss the problems mentioned below. Will obtain old records.  1) Hypertension: Currently taking Toprol XL, and diovan HCT. She has been taking these medications for 2-3 years and has had no complications or complaints. She checks her BP once weekly and most recently got 116/64. Denies chest pain, shortness of breath, edema. BP today is stable.  BP: 120/74 mmHg   2) Hyperlipidemia: Currently taking lipitor 10mg  and reports her last lipid panel was normal. Recently lost 20+ pounds intentionally through diet and exercise. Her diet consists of baked chicken and fish, yogurt, fruits, and vegetables. She is also walking 2-3 times a week.  3) Vertigo: Occasionally, once-twice monthly. Her symptoms resolve in 2-3 minutes after resting or laying down. She does not take any medication.  4) Vitamin D deficiency: Last DEXA was several years ago and has a history of osteopenia. She is currently taking 50,000 units weekly. Denies recent fractures or injuries.   Review of Systems  Constitutional: Negative for fever and chills.  HENT: Negative for rhinorrhea.   Respiratory: Negative for cough and shortness of breath.   Cardiovascular: Negative for chest pain, palpitations and leg swelling.  Gastrointestinal: Negative for diarrhea and constipation.  Genitourinary: Negative for dysuria and frequency.  Musculoskeletal: Negative for myalgias and arthralgias.  Skin: Negative for rash.  Neurological: Negative for headaches.  Psychiatric/Behavioral:       Denies concerns for anxiety or depression       Past Medical History  Diagnosis Date  . Hypertension   . Acid reflux   . Hernia     Present to proximal to umbillical   . Menorrhagia   . Osteopenia   . Vitamin D deficiency   . Atrophic vaginitis   . Adenocarcinoma of the  endometrium/uterus 12/04/2000  . Vertigo   . PONV (postoperative nausea and vomiting)     History   Social History  . Marital Status: Married    Spouse Name: N/A  . Number of Children: N/A  . Years of Education: N/A   Occupational History  . Not on file.   Social History Main Topics  . Smoking status: Never Smoker   . Smokeless tobacco: Never Used  . Alcohol Use: No  . Drug Use: Not on file  . Sexual Activity: Not on file   Other Topics Concern  . 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       Past Surgical History  Procedure Laterality Date  . Tubal ligation    . Cholecystectomy  1997  . Cardiac catheterization  2006  . Hysteroscopy  11/15/2000    D & C, resection of endometrial polyps  . Dilation and curettage of uterus      11/15/2000  . Abdominal hysterectomy  12/04/2000    also bilateral salpingo-oophorectomy  . Oophorectomy  12/04/2000    bilateral salpingo-oophorectomy done with TAH  . Carpal tunnel release  11/20/2011    Procedure: CARPAL TUNNEL RELEASE;  Surgeon: Elaina Hoops, MD;  Location: [facility];  Service: Neurosurgery;  Laterality: Left;  LEFT carpal tunnel release    Family History  Problem  Relation Age of Onset  . Cancer Father     prostate cancer  . Hypertension Father   . Hypertension Sister   . Diabetes Brother   . Heart disease Brother     Deceased   . Hypertension Brother   . Diabetes Brother   . Heart disease Brother     Deceased    No Known Allergies  Current Outpatient Prescriptions on File Prior to Visit  Medication Sig Dispense Refill  . aspirin EC 81 MG tablet Take 81 mg by mouth daily.    Marland Kitchen atorvastatin (LIPITOR) 10 MG tablet Take 10 mg by mouth daily.      . fluticasone (FLONASE) 50 MCG/ACT nasal spray Place 2 sprays into the nose daily.      . metoprolol (TOPROL-XL) 200 MG 24 hr tablet Take 200  mg by mouth daily.    . Calcium Carbonate-Vit D-Min (CALTRATE 600+D PLUS PO) Take 1 tablet by mouth 2 (two) times daily.       No current facility-administered medications on file prior to visit.    BP 120/74 mmHg  Pulse 74  Temp(Src) 98 F (36.7 C) (Oral)  Ht 5\' 6"  (1.676 m)  Wt 147 lb 12.8 oz (67.042 kg)  BMI 23.87 kg/m2  SpO2 98%     Objective:   Physical Exam  Constitutional: She is oriented to person, place, and time. She appears well-developed.  HENT:  Head: Normocephalic.  Right Ear: External ear normal.  Left Ear: External ear normal.  Nose: Nose normal.  Mouth/Throat: Oropharynx is clear and moist.  Eyes: Conjunctivae and EOM are normal. Pupils are equal, round, and reactive to light.  Neck: Neck supple. No thyromegaly present.  Cardiovascular: Normal rate and regular rhythm.   Pulmonary/Chest: Effort normal and breath sounds normal.  Abdominal: Soft. Bowel sounds are normal. There is no tenderness.  Musculoskeletal: Normal range of motion.  Lymphadenopathy:    She has no cervical adenopathy.  Neurological: She is alert and oriented to person, place, and time. She has normal reflexes. No cranial nerve deficit.  Skin: Skin is warm and dry.  Psychiatric: She has a normal mood and affect.          Assessment & Plan:

## 2014-08-31 NOTE — Assessment & Plan Note (Signed)
Stable on Diovan/HCTZ and Toprol XL. Denies palpitations, weakness, dizziness, chest pain. Checks BP once weekly at home. Will continue to monitor.

## 2014-08-31 NOTE — Assessment & Plan Note (Signed)
Stable on Omeprazole 40mg . Follows a healthy diet and exercises. She does not eat fried, fatty foods.

## 2014-08-31 NOTE — Assessment & Plan Note (Signed)
Refilled Vitamin D tablets. Will obtain lab in one month during physical. Will obtain old records.

## 2014-08-31 NOTE — Progress Notes (Signed)
Pre visit review using our clinic review tool, if applicable. No additional management support is needed unless otherwise documented below in the visit note. 

## 2014-08-31 NOTE — Patient Instructions (Signed)
Refills have been sent to your pharmacy. Please schedule a physical with me in 2 months, and a lab appointment one week prior to your physical. We will discuss your results during your physical. It was a pleasure meeting you! Welcome to [facility]!

## 2014-08-31 NOTE — Assessment & Plan Note (Signed)
Stable without medication. She's able to decompress herself during an attack which are infrequent.

## 2014-09-30 ENCOUNTER — Telehealth: Payer: Self-pay | Admitting: Primary Care

## 2014-09-30 ENCOUNTER — Other Ambulatory Visit: Payer: Self-pay | Admitting: Primary Care

## 2014-09-30 NOTE — Telephone Encounter (Signed)
Will you please find out how long Valerie [name] has been taking her Vitamin D 50,000 units?  Thank you!

## 2014-10-01 ENCOUNTER — Other Ambulatory Visit: Payer: Self-pay | Admitting: Primary Care

## 2014-10-01 DIAGNOSIS — E785 Hyperlipidemia, unspecified: Secondary | ICD-10-CM

## 2014-10-01 DIAGNOSIS — Z0000 Encounter for general adult medical examination without abnormal findings: Secondary | ICD-10-CM

## 2014-10-01 DIAGNOSIS — I10 Essential (primary) hypertension: Secondary | ICD-10-CM

## 2014-10-01 DIAGNOSIS — E559 Vitamin D deficiency, unspecified: Secondary | ICD-10-CM

## 2014-10-01 NOTE — Telephone Encounter (Signed)
Called and spoken to the patient. She stated she has been taking her vitamin D for over a year.

## 2014-10-06 ENCOUNTER — Other Ambulatory Visit (INDEPENDENT_AMBULATORY_CARE_PROVIDER_SITE_OTHER): Payer: BLUE CROSS/BLUE SHIELD

## 2014-10-06 DIAGNOSIS — E559 Vitamin D deficiency, unspecified: Secondary | ICD-10-CM

## 2014-10-06 DIAGNOSIS — I10 Essential (primary) hypertension: Secondary | ICD-10-CM | POA: Diagnosis not present

## 2014-10-06 DIAGNOSIS — E785 Hyperlipidemia, unspecified: Secondary | ICD-10-CM | POA: Diagnosis not present

## 2014-10-06 LAB — COMPREHENSIVE METABOLIC PANEL
ALT: 18 U/L (ref 0–35)
AST: 21 U/L (ref 0–37)
Albumin: 3.9 g/dL (ref 3.5–5.2)
Alkaline Phosphatase: 46 U/L (ref 39–117)
BUN: 21 mg/dL (ref 6–23)
CO2: 30 mEq/L (ref 19–32)
Calcium: 9.6 mg/dL (ref 8.4–10.5)
Chloride: 107 mEq/L (ref 96–112)
Creatinine, Ser: 0.77 mg/dL (ref 0.40–1.20)
GFR: 80.7 mL/min (ref >60.00)
Glucose, Bld: 104 mg/dL — ABNORMAL HIGH (ref 70–99)
Potassium: 3.7 mEq/L (ref 3.5–5.1)
Sodium: 140 mEq/L (ref 135–145)
Total Bilirubin: 0.6 mg/dL (ref 0.2–1.2)
Total Protein: 7.1 g/dL (ref 6.0–8.3)

## 2014-10-06 LAB — CBC WITH DIFFERENTIAL/PLATELET
Basophils Absolute: 0 10*3/uL (ref 0.0–0.1)
Basophils Relative: 0.3 % (ref 0.0–3.0)
Eosinophils Absolute: 0.1 10*3/uL (ref 0.0–0.7)
Eosinophils Relative: 1.8 % (ref 0.0–5.0)
HCT: 40 % (ref 36.0–46.0)
Hemoglobin: 13.3 g/dL (ref 12.0–15.0)
Lymphocytes Relative: 40.5 % (ref 12.0–46.0)
Lymphs Abs: 2 10*3/uL (ref 0.7–4.0)
MCHC: 33.3 g/dL (ref 30.0–36.0)
MCV: 94.4 fl (ref 78.0–100.0)
Monocytes Absolute: 0.4 10*3/uL (ref 0.1–1.0)
Monocytes Relative: 7.8 % (ref 3.0–12.0)
Neutro Abs: 2.5 10*3/uL (ref 1.4–7.7)
Neutrophils Relative %: 49.6 % (ref 43.0–77.0)
Platelets: 235 10*3/uL (ref 150.0–400.0)
RBC: 4.24 Mil/uL (ref 3.87–5.11)
RDW: 14.6 % (ref 11.5–15.5)
WBC: 5 10*3/uL (ref 4.0–10.5)

## 2014-10-06 LAB — LIPID PANEL
Cholesterol: 128 mg/dL (ref 0–200)
HDL: 45.4 mg/dL (ref >39.00)
LDL Cholesterol: 70 mg/dL (ref 0–99)
NonHDL: 82.6
Total CHOL/HDL Ratio: 3
Triglycerides: 64 mg/dL (ref 0.0–149.0)
VLDL: 12.8 mg/dL (ref 0.0–40.0)

## 2014-10-06 LAB — VITAMIN D 25 HYDROXY (VIT D DEFICIENCY, FRACTURES): VITD: 30.09 ng/mL (ref 30.00–100.00)

## 2014-10-09 ENCOUNTER — Ambulatory Visit (INDEPENDENT_AMBULATORY_CARE_PROVIDER_SITE_OTHER): Payer: BLUE CROSS/BLUE SHIELD | Admitting: Primary Care

## 2014-10-09 ENCOUNTER — Encounter: Payer: Self-pay | Admitting: Primary Care

## 2014-10-09 VITALS — BP 118/72 | HR 70 | Temp 97.5°F | Ht 66.0 in | Wt 146.1 lb

## 2014-10-09 DIAGNOSIS — E559 Vitamin D deficiency, unspecified: Secondary | ICD-10-CM | POA: Diagnosis not present

## 2014-10-09 DIAGNOSIS — Z0000 Encounter for general adult medical examination without abnormal findings: Secondary | ICD-10-CM | POA: Diagnosis not present

## 2014-10-09 DIAGNOSIS — Z780 Asymptomatic menopausal state: Secondary | ICD-10-CM

## 2014-10-09 DIAGNOSIS — Z23 Encounter for immunization: Secondary | ICD-10-CM | POA: Diagnosis not present

## 2014-10-09 DIAGNOSIS — M8580 Other specified disorders of bone density and structure, unspecified site: Secondary | ICD-10-CM | POA: Diagnosis not present

## 2014-10-09 NOTE — Assessment & Plan Note (Signed)
Currently has 8 weeks left of therapy. Vitamin D level 30 last week. Will recheck once regimen is complete and consider maintenance dose thereafter.

## 2014-10-09 NOTE — Assessment & Plan Note (Signed)
Unremarkable examination. Due for Tetanus, eye exam, and Dexa scan. Mammogram, Colonoscopy, Shingles, Influenza up-to-date. Discussed healthy diet and exercise as she plans to continue her efforts. Labs unremarkable.  Follow up in 1 year for physical.

## 2014-10-09 NOTE — Patient Instructions (Signed)
Your cholesterol, liver, kidney, and blood counts look great! Continue your efforts to exercise daily and maintain a healthy diet. You will be contacted regarding your Dexa (bone density) scan.  Please let us know if you have not heard back within one week. You have been given a Tetanus shot today, you will be covered for 10 years. Schedule a lab appointment in 9 weeks to recheck your Vitamin D levels. Follow up in 6 months for check up. It was nice seeing you today!

## 2014-10-09 NOTE — Progress Notes (Signed)
Subjective:    Patient ID: Valerie [name], female    DOB: 1950-07-04, 64 y.o.   MRN: [id number]  HPI  Valerie [name] is a 64 year old female who presents today for complete physical.  Immunizations: -Tetanus: Unknown, and no documentation present on [facility]. -Influenza: Obtained last season -Pneumonia: Never had and has no co-morbidities -Shingles: Administered in 2015  Diet: Healthy. Consists of fresh fruits and vegetables, lean meat. Drinks mostly unsweet tea, some water and sodas. Recent weight loss of 20 pounds and maintaining.  Exercise: Walking 20-25 minutes daily. Eye exam: Last exam was three years ago. Plans to schedule at [place]. Dental exam: Cleaning and check up in 2016. Colonoscopy: 2-3 years ago and was normal. No follow up needed. Dexa: Due. Taking Vitamin D 50,000 units weekly for greater than one year. 8 weeks left on current regimen. Pap Smear: Obtained 2016 at [facility]: Obtained in 2015, unremarkable.  Wt Readings from Last 3 Encounters:  10/09/14 146 lb 1.9 oz (66.28 kg)  08/31/14 147 lb 12.8 oz (67.042 kg)     Review of Systems  Constitutional: Negative for fatigue and unexpected weight change.  HENT: Negative for rhinorrhea.   Respiratory: Negative for cough and shortness of breath.   Cardiovascular: Negative for chest pain and leg swelling.  Gastrointestinal: Negative for diarrhea, constipation and blood in stool.  Genitourinary: Negative for dysuria, frequency and difficulty urinating.  Musculoskeletal: Negative for myalgias and arthralgias.  Skin: Negative for rash.  Allergic/Immunologic: Negative for environmental allergies.  Neurological: Negative for dizziness and numbness.       Occasional headaches  Hematological: Negative for adenopathy.  Psychiatric/Behavioral:       Denies concerns for anxiety or depression.       Past Medical History  Diagnosis Date  . Hypertension   . Acid reflux   . Hernia     Present to proximal  to umbillical   . Menorrhagia   . Osteopenia   . Vitamin D deficiency   . Atrophic vaginitis   . Adenocarcinoma of the endometrium/uterus 12/04/2000  . Vertigo   . PONV (postoperative nausea and vomiting)     History   Social History  . Marital Status: Married    Spouse Name: N/A  . Number of Children: N/A  . Years of Education: N/A   Occupational History  . Not on file.   Social History Main Topics  . Smoking status: Never Smoker   . Smokeless tobacco: Never Used  . Alcohol Use: No  . Drug Use: Not on file  . Sexual Activity: Not on file   Other Topics Concern  . 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       Past Surgical History  Procedure Laterality Date  . Tubal ligation    . Cholecystectomy  1997  . Cardiac catheterization  2006  . Hysteroscopy  11/15/2000    D & C, resection of endometrial polyps  . Dilation and curettage of uterus      11/15/2000  . Abdominal hysterectomy  12/04/2000    also bilateral salpingo-oophorectomy  . Oophorectomy  12/04/2000    bilateral salpingo-oophorectomy done with TAH  . Carpal tunnel release  11/20/2011    Procedure: CARPAL TUNNEL RELEASE;  Surgeon: Elaina Hoops, MD;  Location: [facility];  Service: Neurosurgery;  Laterality: Left;  LEFT carpal tunnel release    Family History  Problem Relation Age of Onset  . Cancer Father     prostate cancer  . Hypertension Father   . Hypertension Sister   . Diabetes Brother   . Heart disease Brother     Deceased   . Hypertension Brother   . Diabetes Brother   . Heart disease Brother     Deceased    No Known Allergies  Current Outpatient Prescriptions on File Prior to Visit  Medication Sig Dispense Refill  . aspirin EC 81 MG tablet Take 81 mg by mouth daily.    Marland Kitchen atorvastatin (LIPITOR) 10 MG tablet Take 10 mg by mouth daily.      . Calcium  Carbonate-Vit D-Min (CALTRATE 600+D PLUS PO) Take 1 tablet by mouth 2 (two) times daily.      . fluticasone (FLONASE) 50 MCG/ACT nasal spray Place 2 sprays into the nose daily.      . metoprolol (TOPROL-XL) 200 MG 24 hr tablet Take 200 mg by mouth daily.    Marland Kitchen omeprazole (PRILOSEC) 40 MG capsule Take 1 capsule (40 mg total) by mouth daily. 30 capsule 5  . valsartan-hydrochlorothiazide (DIOVAN-HCT) 320-12.5 MG per tablet     . Vitamin D, Ergocalciferol, (DRISDOL) 50000 UNITS CAPS capsule Take 1 capsule (50,000 Units total) by mouth every 7 (seven) days. Take on Thursdays. 30 capsule 0   No current facility-administered medications on file prior to visit.    BP 118/72 mmHg  Pulse 70  Temp(Src) 97.5 F (36.4 C) (Oral)  Ht 5\' 6"  (1.676 m)  Wt 146 lb 1.9 oz (66.28 kg)  BMI 23.60 kg/m2  SpO2 98%    Objective:   Physical Exam  Constitutional: She is oriented to person, place, and time. She appears well-developed and well-nourished.  HENT:  Right Ear: Tympanic membrane and ear canal normal.  Left Ear: Tympanic membrane and ear canal normal.  Nose: Nose normal.  Mouth/Throat: Oropharynx is clear and moist.  Eyes: Conjunctivae and EOM are normal. Pupils are equal, round, and reactive to light.  Neck: Neck supple. No thyromegaly present.  Cardiovascular: Normal rate and regular rhythm.   Pulmonary/Chest: Effort normal and breath sounds normal. Right breast exhibits no mass, no skin change and no tenderness. Left breast exhibits no mass, no skin change and no tenderness.  Abdominal: Soft. Bowel sounds are normal. She exhibits no mass. There is no tenderness.  Musculoskeletal: Normal range of motion.  Lymphadenopathy:    She has no cervical adenopathy.  Neurological: She is alert and oriented to person, place, and time. She has normal strength and normal reflexes. No cranial nerve deficit. She displays a negative Romberg sign. Coordination and gait normal.  Skin: Skin is warm and dry.    Psychiatric: She has a normal mood and affect.          Assessment & Plan:  Follow up in 6 months for check up. Lab appointment in 9 weeks for vitamin D recheck.

## 2014-10-09 NOTE — Assessment & Plan Note (Signed)
Referral made for Dexa scan. Continues with calcium and Vitamin D 50,000 units for 8 weeks. Will recheck labs in 9 weeks.

## 2014-10-09 NOTE — Progress Notes (Signed)
Pre visit review using our clinic review tool, if applicable. No additional management support is needed unless otherwise documented below in the visit note. 

## 2014-10-13 ENCOUNTER — Telehealth: Payer: Self-pay | Admitting: Primary Care

## 2014-10-13 ENCOUNTER — Encounter: Payer: Self-pay | Admitting: Primary Care

## 2014-10-13 ENCOUNTER — Ambulatory Visit (INDEPENDENT_AMBULATORY_CARE_PROVIDER_SITE_OTHER): Payer: BLUE CROSS/BLUE SHIELD | Admitting: Primary Care

## 2014-10-13 VITALS — BP 140/86 | HR 57 | Temp 97.4°F | Ht 66.0 in | Wt 146.8 lb

## 2014-10-13 DIAGNOSIS — I10 Essential (primary) hypertension: Secondary | ICD-10-CM

## 2014-10-13 NOTE — Progress Notes (Signed)
Subjective:    Patient ID: Valerie [name], female    DOB: November 18, 1950, 64 y.o.   MRN: [id number]  HPI  Valerie [name] is a 64 year old female who presents today with a chief complaint of elevated blood pressure.  She has a history of hypertension and is managed on Diovan and Toprol Xl daily. She was rushing around the house this morning getting ready for work and checked her blood pressure. The reading she got was 147/80's, rechecked it 10 minutes later on the same arm and got 189/103. She denies chest pain, shortness of breath, blurred vision, and has been more stressed at work lately.  Today her blood pressure in the clinic was 158/92 upon arrival and 140/86 upon leaving.   BP Readings from Last 3 Encounters:  10/13/14 140/86  10/09/14 118/72  08/31/14 120/74     Review of Systems  Eyes: Negative for visual disturbance.  Respiratory: Negative for shortness of breath.   Cardiovascular: Negative for chest pain and palpitations.  Neurological: Negative for dizziness and headaches.       Past Medical History  Diagnosis Date  . Hypertension   . Acid reflux   . Hernia     Present to proximal to umbillical   . Menorrhagia   . Osteopenia   . Vitamin D deficiency   . Atrophic vaginitis   . Adenocarcinoma of the endometrium/uterus 12/04/2000  . Vertigo   . PONV (postoperative nausea and vomiting)     History   Social History  . Marital Status: Married    Spouse Name: N/A  . Number of Children: N/A  . Years of Education: N/A   Occupational History  . Not on file.   Social History Main Topics  . Smoking status: Never Smoker   . Smokeless tobacco: Never Used  . Alcohol Use: No  . Drug Use: Not on file  . Sexual Activity: Not on file   Other Topics Concern  . 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       Past Surgical History  Procedure Laterality  Date  . Tubal ligation    . Cholecystectomy  1997  . Cardiac catheterization  2006  . Hysteroscopy  11/15/2000    D & C, resection of endometrial polyps  . Dilation and curettage of uterus      11/15/2000  . Abdominal hysterectomy  12/04/2000    also bilateral salpingo-oophorectomy  . Oophorectomy  12/04/2000    bilateral salpingo-oophorectomy done with TAH  . Carpal tunnel release  11/20/2011    Procedure: CARPAL TUNNEL RELEASE;  Surgeon: Elaina Hoops, MD;  Location: [facility];  Service: Neurosurgery;  Laterality: Left;  LEFT carpal tunnel release    Family History  Problem Relation Age of Onset  . Cancer Father     prostate cancer  . Hypertension Father   . Hypertension Sister   . Diabetes Brother   . Heart disease Brother     Deceased   . Hypertension Brother   . Diabetes Brother   . Heart disease Brother     Deceased    No Known Allergies  Current Outpatient Prescriptions on File Prior to Visit  Medication Sig Dispense Refill  . aspirin EC 81 MG tablet Take 81 mg by mouth daily.    Marland Kitchen atorvastatin (LIPITOR) 10  MG tablet Take 10 mg by mouth daily.      . Calcium Carbonate-Vit D-Min (CALTRATE 600+D PLUS PO) Take 1 tablet by mouth 2 (two) times daily.      . fluticasone (FLONASE) 50 MCG/ACT nasal spray Place 2 sprays into the nose daily.      . metoprolol (TOPROL-XL) 200 MG 24 hr tablet Take 200 mg by mouth daily.    Marland Kitchen omeprazole (PRILOSEC) 40 MG capsule Take 1 capsule (40 mg total) by mouth daily. 30 capsule 5  . valsartan-hydrochlorothiazide (DIOVAN-HCT) 320-12.5 MG per tablet     . Vitamin D, Ergocalciferol, (DRISDOL) 50000 UNITS CAPS capsule Take 1 capsule (50,000 Units total) by mouth every 7 (seven) days. Take on Thursdays. 30 capsule 0   No current facility-administered medications on file prior to visit.    BP 140/86 mmHg  Pulse 57  Temp(Src) 97.4 F (36.3 C) (Oral)  Ht 5\' 6"  (1.676 m)  Wt 146 lb 12.8 oz (66.588 kg)  BMI 23.71 kg/m2  SpO2  98%    Objective:   Physical Exam  Constitutional: She is oriented to person, place, and time. No distress.  Cardiovascular: Normal rate and regular rhythm.   Pulmonary/Chest: Effort normal.  Neurological: She is alert and oriented to person, place, and time.  Skin: Skin is warm and dry.          Assessment & Plan:  Elevated blood pressure:  Suspect this is due to stress and from taking her pressure just prior to being active around the house.  No chest pain, SOB, headache, dizziness, changes in vision. Education provided regarding checking BP at rest. She is to check tonight after work and in the morning tomorrow and to call me with results. She verbalized understanding.

## 2014-10-13 NOTE — Telephone Encounter (Signed)
Left message asking pt to call office See where she had her last bone density and if she can go any day and time

## 2014-10-13 NOTE — Progress Notes (Signed)
Pre visit review using our clinic review tool, if applicable. No additional management support is needed unless otherwise documented below in the visit note. 

## 2014-10-13 NOTE — Patient Instructions (Signed)
Your blood pressure rechecked in the office was initially 158/92 and then 140/86. This is a good sign. Measure your pressure tonight after resting and also in the morning. Call me with your results. Call me if you develop chest pain, headaches, change in vision. It was nice seeing you today!

## 2014-10-14 NOTE — Telephone Encounter (Signed)
Left message asking pt to call office Please let pt know  Her bone density appointment is @  [facility] [place], [place] Monday 10/19/14 @ 10:20 arrive @ 10:10 No calcium the day before or the day of  No metal snaps or zippers below waist

## 2014-10-14 NOTE — Telephone Encounter (Signed)
Pt aware of appointment date adn time

## 2014-10-16 ENCOUNTER — Ambulatory Visit (INDEPENDENT_AMBULATORY_CARE_PROVIDER_SITE_OTHER): Payer: BLUE CROSS/BLUE SHIELD | Admitting: Family Medicine

## 2014-10-16 ENCOUNTER — Encounter: Payer: Self-pay | Admitting: Family Medicine

## 2014-10-16 ENCOUNTER — Telehealth: Payer: Self-pay | Admitting: Primary Care

## 2014-10-16 VITALS — BP 140/92 | HR 61 | Temp 98.6°F | Wt 145.0 lb

## 2014-10-16 DIAGNOSIS — I10 Essential (primary) hypertension: Secondary | ICD-10-CM | POA: Diagnosis not present

## 2014-10-16 MED ORDER — METOPROLOL SUCCINATE ER 200 MG PO TB24: 100.0000 mg | ORAL_TABLET | Freq: Every day | ORAL | Status: DC

## 2014-10-16 NOTE — Progress Notes (Signed)
Pre visit review using our clinic review tool, if applicable. No additional management support is needed unless otherwise documented below in the visit note.  Here for f/u BP.  She has gotten a new BP cuff at home.  BP was high, ~190/100, at home.  She has been tired recently.  This AM her BP was 116 systolic.  That was at rest, early this AM.    She has some possible heartburn.  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  No BLE edema.  Not lightheaded.  Recently Cr wnl.  D/w pt.    She has lost weight with effort, down 27 lbs.  She had cut her metoprolol in half today, first such dose today.    Meds, vitals, and allergies reviewed.   ROS: See HPI.  Otherwise, noncontributory.  GEN: nad, alert and oriented HEENT: mucous membranes moist NECK: supple w/o LA CV: rrr. PULM: ctab, no inc wob ABD: soft, +bs EXT: no edema SKIN: no acute rash

## 2014-10-16 NOTE — Telephone Encounter (Signed)
Pt called back stating she has   dalsartan 320  She stated the one dr Damita Dunnings wanted her to take was dalsartan 320-12.5  (she does not have this)  [facility]

## 2014-10-16 NOTE — Telephone Encounter (Signed)
Pt is aware as instructed 

## 2014-10-16 NOTE — Patient Instructions (Addendum)
Bring your cuff in next time to calibrate it.  Stay on 100mg  metoprolol a day. See if that helps with fatigue.  Check your BP med list (320/12.5mg ). I would expect you to feel a little better in a few days.   Take care.  Update [place] as needed.

## 2014-10-16 NOTE — Telephone Encounter (Signed)
I'll update her med list. Would continue as is for now with plain valsartan w/o the HCTZ.  Continue as planned about the metoprolol. The rest of her plan still stands.  Please route this back to me so I can update her med list.

## 2014-10-19 ENCOUNTER — Ambulatory Visit
Admission: RE | Admit: 2014-10-19 | Discharge: 2014-10-19 | Disposition: A | Discharge status: Home / Self Care | Payer: BLUE CROSS/BLUE SHIELD | Source: Ambulatory Visit | Attending: Primary Care | Admitting: Primary Care

## 2014-10-19 DIAGNOSIS — Z13820 Encounter for screening for osteoporosis: Secondary | ICD-10-CM | POA: Insufficient documentation

## 2014-10-19 DIAGNOSIS — Z780 Asymptomatic menopausal state: Secondary | ICD-10-CM | POA: Insufficient documentation

## 2014-10-19 NOTE — Assessment & Plan Note (Signed)
I question her home cuff readings.   She can bring your cuff in next time to calibrate it.  Stay on 100mg  metoprolol a day and see if that helps with fatigue.  She updated her med list today, see following phone note. I would expect her to feel a little better in a few days, with less fatigue on the lower dose of metoprolol.  F/u prn.

## 2014-10-19 NOTE — Telephone Encounter (Signed)
Done. Thanks.

## 2014-10-23 ENCOUNTER — Other Ambulatory Visit: Payer: BLUE CROSS/BLUE SHIELD

## 2014-10-30 ENCOUNTER — Encounter: Payer: BLUE CROSS/BLUE SHIELD | Admitting: Primary Care

## 2014-11-15 ENCOUNTER — Emergency Department: Payer: BLUE CROSS/BLUE SHIELD

## 2014-11-15 ENCOUNTER — Other Ambulatory Visit: Payer: Self-pay

## 2014-11-15 ENCOUNTER — Emergency Department
Admission: EM | Admit: 2014-11-15 | Discharge: 2014-11-16 | Disposition: A | Discharge status: Home / Self Care | Payer: BLUE CROSS/BLUE SHIELD | Attending: Emergency Medicine | Admitting: Emergency Medicine

## 2014-11-15 DIAGNOSIS — R079 Chest pain, unspecified: Secondary | ICD-10-CM

## 2014-11-15 DIAGNOSIS — R0789 Other chest pain: Secondary | ICD-10-CM | POA: Diagnosis present

## 2014-11-15 DIAGNOSIS — Z7952 Long term (current) use of systemic steroids: Secondary | ICD-10-CM | POA: Insufficient documentation

## 2014-11-15 DIAGNOSIS — Z79899 Other long term (current) drug therapy: Secondary | ICD-10-CM | POA: Insufficient documentation

## 2014-11-15 DIAGNOSIS — I10 Essential (primary) hypertension: Secondary | ICD-10-CM | POA: Diagnosis not present

## 2014-11-15 DIAGNOSIS — I159 Secondary hypertension, unspecified: Secondary | ICD-10-CM

## 2014-11-15 DIAGNOSIS — Z7982 Long term (current) use of aspirin: Secondary | ICD-10-CM | POA: Diagnosis not present

## 2014-11-15 LAB — BASIC METABOLIC PANEL
Anion gap: 3 — ABNORMAL LOW (ref 5–15)
BUN: 15 mg/dL (ref 6–20)
CO2: 28 mmol/L (ref 22–32)
Calcium: 9.1 mg/dL (ref 8.9–10.3)
Chloride: 108 mmol/L (ref 101–111)
Creatinine, Ser: 0.88 mg/dL (ref 0.44–1.00)
GFR calc Af Amer: >60 mL/min (ref >60)
GFR calc non Af Amer: >60 mL/min (ref >60)
Glucose, Bld: 101 mg/dL — ABNORMAL HIGH (ref 65–99)
Potassium: 3.3 mmol/L — ABNORMAL LOW (ref 3.5–5.1)
Sodium: 139 mmol/L (ref 135–145)

## 2014-11-15 LAB — CBC
HCT: 39 % (ref 35.0–47.0)
Hemoglobin: 13.1 g/dL (ref 12.0–16.0)
MCH: 31.5 pg (ref 26.0–34.0)
MCHC: 33.6 g/dL (ref 32.0–36.0)
MCV: 93.7 fL (ref 80.0–100.0)
Platelets: 239 10*3/uL (ref 150–440)
RBC: 4.16 MIL/uL (ref 3.80–5.20)
RDW: 14.8 % — ABNORMAL HIGH (ref 11.5–14.5)
WBC: 7.2 10*3/uL (ref 3.6–11.0)

## 2014-11-15 LAB — TROPONIN I: Troponin I: <0.03 ng/mL (ref <0.031)

## 2014-11-15 NOTE — ED Notes (Signed)
Pt to ED from home c/o chest pain starting around 1930 tonight.  Pt states pain to sternum area radiating to back that is 5/10 pain and aching.  Pt chest TTP.  Denies SOB, n/v/d, or headaches.  Pt states blood pressure elevated at home PTA.  Pt A&Ox4, speaking in complete and coherent sentences, VSS, and in NAD at this time.

## 2014-11-15 NOTE — ED Notes (Signed)
Pt c/o increased heart rate for the last few nights, only at night time; readings at home 118 and 130 and as soon as she wakes in the morning; HR goes down after taking Toprol; this afternoon she began having pain to the center of her chest, radiating through to her back; some nausea but no vomiting; weakness in legs intermittent

## 2014-11-15 NOTE — ED Provider Notes (Signed)
[facility] Emergency Department Provider Note  ____________________________________________  Time seen: Approximately 11:10 PM  I have reviewed the triage vital signs and the nursing notes.   HISTORY  Chief Complaint Hypertension and Chest Pain   HPI Valerie [name] is a 64 y.o. female who comes in with chest pain increased blood pressure and increased heart rate. The patient reports for the past 3-4 days her blood pressures been in the 509T to 267T systolic. She reports that she has had some chest pain but it is worsened today. Her husband reports her blood pressures normally in the 100s. The patient reports that she's had a sinus infection for 3 weeks and has been taking an antibiotic for the last 10 days. She describes her chest pain is sharp in the middle of her chest she denies any radiation of the pain to her jaw shoulder or back. She reports that currently the pain is a 0 out of 10 but it was worse earlier. The patient has been taking ibuprofen for the pain. Her heart rate has been in the 100s to 130s but the patient reports that after she takes her Toprol it does improve. She reports that she's had some cough but it has not been a lot. She reports that in the past for blood pressure would drop very low which is why she occasionally takes valsartan for her blood pressure. The patient reports that she did take valsartan earlier today. She's had some nausea and some mild neck pain on the right side. She also reports that she's had some mid abdominal pain occasionally as well.   Past Medical History  Diagnosis Date  . Hypertension   . Acid reflux   . Hernia     Present to proximal to umbillical   . Menorrhagia   . Osteopenia   . Vitamin D deficiency   . Atrophic vaginitis   . Adenocarcinoma of the endometrium/uterus 12/04/2000  . Vertigo   . PONV (postoperative nausea and vomiting)     Patient Active Problem List   Diagnosis Date Noted  . Preventative  health care 10/09/2014  . Benign paroxysmal positional vertigo 08/31/2014  . Acid reflux   . Hernia   . Menorrhagia   . Osteopenia   . Vitamin D deficiency   . Atrophic vaginitis   . Adenocarcinoma of the endometrium/uterus   . Generalized anxiety disorder 01/23/2007  . Essential hypertension 01/23/2007    Past Surgical History  Procedure Laterality Date  . Tubal ligation    . Cholecystectomy  1997  . Cardiac catheterization  2006  . Hysteroscopy  11/15/2000    D & C, resection of endometrial polyps  . Dilation and curettage of uterus      11/15/2000  . Abdominal hysterectomy  12/04/2000    also bilateral salpingo-oophorectomy  . Oophorectomy  12/04/2000    bilateral salpingo-oophorectomy done with TAH  . Carpal tunnel release  11/20/2011    Procedure: CARPAL TUNNEL RELEASE;  Surgeon: Elaina Hoops, MD;  Location: [facility];  Service: Neurosurgery;  Laterality: Left;  LEFT carpal tunnel release    Current Outpatient Rx  Name  Route  Sig  Dispense  Refill  . aspirin EC 81 MG tablet   Oral   Take 81 mg by mouth daily.         Marland Kitchen atorvastatin (LIPITOR) 10 MG tablet   Oral   Take 10 mg by mouth daily.           Marland Kitchen  Calcium Carbonate-Vit D-Min (CALTRATE 600+D PLUS PO)   Oral   Take 1 tablet by mouth 2 (two) times daily.           . fluticasone (FLONASE) 50 MCG/ACT nasal spray   Nasal   Place 2 sprays into the nose daily.           . metoprolol (TOPROL-XL) 200 MG 24 hr tablet   Oral   Take 0.5 tablets (100 mg total) by mouth daily.         Marland Kitchen omeprazole (PRILOSEC) 40 MG capsule   Oral   Take 1 capsule (40 mg total) by mouth daily.   30 capsule   5   . valsartan (DIOVAN) 320 MG tablet   Oral   Take 320 mg by mouth daily.         . Vitamin D, Ergocalciferol, (DRISDOL) 50000 UNITS CAPS capsule   Oral   Take 1 capsule (50,000 Units total) by mouth every 7 (seven) days. Take on Thursdays.   30 capsule   0     Allergies Review of patient's allergies  indicates no known allergies.  Family History  Problem Relation Age of Onset  . Cancer Father     prostate cancer  . Hypertension Father   . Hypertension Sister   . Diabetes Brother   . Heart disease Brother     Deceased   . Hypertension Brother   . Diabetes Brother   . Heart disease Brother     Deceased    Social History History  Substance Use Topics  . Smoking status: Never Smoker   . Smokeless tobacco: Never Used  . Alcohol Use: No    Review of Systems Constitutional: No fever/chills Eyes: No visual changes. ENT: No sore throat. Cardiovascular:  chest pain. Respiratory: Denies shortness of breath. Gastrointestinal:  abdominal pain and nausea,  Genitourinary: Negative for dysuria. Musculoskeletal: Negative for back pain. Skin: Negative for rash. Neurological: Negative for headaches  10-point ROS otherwise negative.  ____________________________________________   PHYSICAL EXAM:  VITAL SIGNS: ED Triage Vitals  Enc Vitals Group     BP 11/15/14 2151 168/90 mmHg     Pulse Rate 11/15/14 2151 87     Resp 11/15/14 2230 12     Temp 11/15/14 2151 98.4 F (36.9 C)     Temp Source 11/15/14 2151 Oral     SpO2 11/15/14 2151 98 %     Weight 11/15/14 2151 145 lb (65.772 kg)     Height 11/15/14 2151 5\' 5"  (1.651 m)     Head Cir --      Peak Flow --      Pain Score 11/15/14 2151 5     Pain Loc --      Pain Edu? --      Excl. in [place]? --     Constitutional: Alert and oriented. Well appearing and in no acute distress. Eyes: Conjunctivae are normal. PERRL. EOMI. Head: Atraumatic. Nose: No congestion/rhinnorhea. Mouth/Throat: Mucous membranes are moist.  Oropharynx non-erythematous. Cardiovascular: Normal rate, regular rhythm. Grossly normal heart sounds.  Good peripheral circulation. Respiratory: Normal respiratory effort.  No retractions. Lungs CTAB. Gastrointestinal: Soft and nontender. No distention. Positive bowel sounds Genitourinary: Deferred Musculoskeletal:  No lower extremity tenderness nor edema.   Neurologic:  Normal speech and language. No gross focal neurologic deficits are appreciated.  Skin:  Skin is warm, dry and intact. No rash noted. Psychiatric: Mood and affect are normal.   ____________________________________________   LABS (all labs  ordered are listed, but only abnormal results are displayed)  Labs Reviewed  CBC - Abnormal; Notable for the following:    RDW 14.8 (*)    All other components within normal limits  BASIC METABOLIC PANEL - Abnormal; Notable for the following:    Potassium 3.3 (*)    Glucose, Bld 101 (*)    Anion gap 3 (*)    All other components within normal limits  URINALYSIS COMPLETEWITH MICROSCOPIC ([facility] ONLY) - Abnormal; Notable for the following:    Color, Urine YELLOW (*)    APPearance CLEAR (*)    Hgb urine dipstick 2+ (*)    Squamous Epithelial / LPF 0-5 (*)    All other components within normal limits  TROPONIN I  TROPONIN I   ____________________________________________  EKG  ED ECG REPORT I, Loney Hering, the attending physician, personally viewed and interpreted this ECG.   Date: 11/15/2014  EKG Time: 2218  Rate: 69  Rhythm: normal EKG, normal sinus rhythm  Axis: Normal  Intervals:none  ST&T Change: None  ____________________________________________  RADIOLOGY  Chest x-ray: No acute cardiopulmonary process seen ____________________________________________   PROCEDURES  Procedure(s) performed: None  Critical Care performed: No  ____________________________________________   INITIAL IMPRESSION / ASSESSMENT AND PLAN / ED COURSE  Pertinent labs & imaging results that were available during my care of the patient were reviewed by me and considered in my medical decision making (see chart for details).  This is a 64 year old female who comes in with elevation of her blood pressure and chest pain. The patient reports that her chest pain currently is gone but we will  evaluate the patient's blood work as well as repeat the troponin. We will also monitor the patient's blood pressure to see if she has any increases while she is here in the emergency department. Her current blood pressure is 143/80 at 2230.  The patient had a repeat troponin which was unremarkable. The patient's blood pressure is still improving with her last blood pressure at 2 AM of 116/75. I will discharge the patient home to have her follow-up with her primary care physician for further evaluation of this chest pain and elevated blood pressure. ____________________________________________   FINAL CLINICAL IMPRESSION(S) / ED DIAGNOSES  Final diagnoses:  Chest pain, unspecified chest pain type  Secondary hypertension, unspecified      Loney Hering, MD 11/16/14 0225

## 2014-11-15 NOTE — ED Notes (Signed)
Patient transported to X-ray 

## 2014-11-16 LAB — URINALYSIS COMPLETE WITH MICROSCOPIC (ARMC ONLY)
Bacteria, UA: NONE SEEN
Bilirubin Urine: NEGATIVE
Glucose, UA: NEGATIVE mg/dL
Ketones, ur: NEGATIVE mg/dL
Leukocytes, UA: NEGATIVE
Nitrite: NEGATIVE
Protein, ur: NEGATIVE mg/dL
Specific Gravity, Urine: 1.008 (ref 1.005–1.030)
pH: 6 (ref 5.0–8.0)

## 2014-11-16 LAB — TROPONIN I: Troponin I: <0.03 ng/mL (ref <0.031)

## 2014-11-16 NOTE — Discharge Instructions (Signed)
Chest Pain (Nonspecific) °It is often hard to give a specific diagnosis for the cause of chest pain. There is always a chance that your pain could be related to something serious, such as a heart attack or a blood clot in the lungs. You need to follow up with your health care provider for further evaluation. °CAUSES  °· Heartburn. °· Pneumonia or bronchitis. °· Anxiety or stress. °· Inflammation around your heart (pericarditis) or lung (pleuritis or pleurisy). °· A blood clot in the lung. °· A collapsed lung (pneumothorax). It can develop suddenly on its own (spontaneous pneumothorax) or from trauma to the chest. °· Shingles infection (herpes zoster virus). °The chest wall is composed of bones, muscles, and cartilage. Any of these can be the source of the pain. °· The bones can be bruised by injury. °· The muscles or cartilage can be strained by coughing or overwork. °· The cartilage can be affected by inflammation and become sore (costochondritis). °DIAGNOSIS  °Lab tests or other studies may be needed to find the cause of your pain. Your health care provider may have you take a test called an ambulatory electrocardiogram (ECG). An ECG records your heartbeat patterns over a 24-hour period. You may also have other tests, such as: °· Transthoracic echocardiogram (TTE). During echocardiography, sound waves are used to evaluate how blood flows through your heart. °· Transesophageal echocardiogram (TEE). °· Cardiac monitoring. This allows your health care provider to monitor your heart rate and rhythm in real time. °· Holter monitor. This is a portable device that records your heartbeat and can help diagnose heart arrhythmias. It allows your health care provider to track your heart activity for several days, if needed. °· Stress tests by exercise or by giving medicine that makes the heart beat faster. °TREATMENT  °· Treatment depends on what may be causing your chest pain. Treatment may include: °· Acid blockers for  heartburn. °· Anti-inflammatory medicine. °· Pain medicine for inflammatory conditions. °· Antibiotics if an infection is present. °· You may be advised to change lifestyle habits. This includes stopping smoking and avoiding alcohol, caffeine, and chocolate. °· You may be advised to keep your head raised (elevated) when sleeping. This reduces the chance of acid going backward from your stomach into your esophagus. °Most of the time, nonspecific chest pain will improve within 2-3 days with rest and mild pain medicine.  °HOME CARE INSTRUCTIONS  °· If antibiotics were prescribed, take them as directed. Finish them even if you start to feel better. °· For the next few days, avoid physical activities that bring on chest pain. Continue physical activities as directed. °· Do not use any tobacco products, including cigarettes, chewing tobacco, or electronic cigarettes. °· Avoid drinking alcohol. °· Only take medicine as directed by your health care provider. °· Follow your health care provider's suggestions for further testing if your chest pain does not go away. °· Keep any follow-up appointments you made. If you do not go to an appointment, you could develop lasting (chronic) problems with pain. If there is any problem keeping an appointment, call to reschedule. °SEEK MEDICAL CARE IF:  °· Your chest pain does not go away, even after treatment. °· You have a rash with blisters on your chest. °· You have a fever. °SEEK IMMEDIATE MEDICAL CARE IF:  °· You have increased chest pain or pain that spreads to your arm, neck, jaw, back, or abdomen. °· You have shortness of breath. °· You have an increasing cough, or you cough   up blood.  You have severe back or abdominal pain.  You feel nauseous or vomit.  You have severe weakness.  You faint.  You have chills. This is an emergency. Do not wait to see if the pain will go away. Get medical help at once. Call your local emergency services (911 in [place] Do not drive  yourself to the hospital. MAKE SURE YOU:   Understand these instructions.  Will watch your condition.  Will get help right away if you are not doing well or get worse. Document Released: 02/22/2005 Document Revised: 05/20/2013 Document Reviewed: 12/19/2007 [employer] Patient Information 2015 [place], [place]. This information is not intended to replace advice given to you by your health care provider. Make sure you discuss any questions you have with your health care provider.  Hypertension Hypertension, commonly called high blood pressure, is when the force of blood pumping through your arteries is too strong. Your arteries are the blood vessels that carry blood from your heart throughout your body. A blood pressure reading consists of a higher number over a lower number, such as 110/72. The higher number (systolic) is the pressure inside your arteries when your heart pumps. The lower number (diastolic) is the pressure inside your arteries when your heart relaxes. Ideally you want your blood pressure below 120/80. Hypertension forces your heart to work harder to pump blood. Your arteries may become narrow or stiff. Having hypertension puts you at risk for heart disease, stroke, and other problems.  RISK FACTORS Some risk factors for high blood pressure are controllable. Others are not.  Risk factors you cannot control include:   Race. You may be at higher risk if you are African American.  Age. Risk increases with age.  Gender. Men are at higher risk than women before age 17 years. After age 24, women are at higher risk than men. Risk factors you can control include:  Not getting enough exercise or physical activity.  Being overweight.  Getting too much fat, sugar, calories, or salt in your diet.  Drinking too much alcohol. SIGNS AND SYMPTOMS Hypertension does not usually cause signs or symptoms. Extremely high blood pressure (hypertensive crisis) may cause headache, anxiety, shortness  of breath, and nosebleed. DIAGNOSIS  To check if you have hypertension, your health care provider will measure your blood pressure while you are seated, with your arm held at the level of your heart. It should be measured at least twice using the same arm. Certain conditions can cause a difference in blood pressure between your right and left arms. A blood pressure reading that is higher than normal on one occasion does not mean that you need treatment. If one blood pressure reading is high, ask your health care provider about having it checked again. TREATMENT  Treating high blood pressure includes making lifestyle changes and possibly taking medicine. Living a healthy lifestyle can help lower high blood pressure. You may need to change some of your habits. Lifestyle changes may include:  Following the DASH diet. This diet is high in fruits, vegetables, and whole grains. It is low in salt, red meat, and added sugars.  Getting at least 2 hours of brisk physical activity every week.  Losing weight if necessary.  Not smoking.  Limiting alcoholic beverages.  Learning ways to reduce stress. If lifestyle changes are not enough to get your blood pressure under control, your health care provider may prescribe medicine. You may need to take more than one. Work closely with your health care provider  to understand the risks and benefits. HOME CARE INSTRUCTIONS  Have your blood pressure rechecked as directed by your health care provider.   Take medicines only as directed by your health care provider. Follow the directions carefully. Blood pressure medicines must be taken as prescribed. The medicine does not work as well when you skip doses. Skipping doses also puts you at risk for problems.   Do not smoke.   Monitor your blood pressure at home as directed by your health care provider. SEEK MEDICAL CARE IF:   You think you are having a reaction to medicines taken.  You have recurrent  headaches or feel dizzy.  You have swelling in your ankles.  You have trouble with your vision. SEEK IMMEDIATE MEDICAL CARE IF:  You develop a severe headache or confusion.  You have unusual weakness, numbness, or feel faint.  You have severe chest or abdominal pain.  You vomit repeatedly.  You have trouble breathing. MAKE SURE YOU:   Understand these instructions.  Will watch your condition.  Will get help right away if you are not doing well or get worse. Document Released: 05/15/2005 Document Revised: 09/29/2013 Document Reviewed: 03/07/2013 [facility] Patient Information 2015 [place], [place]. This information is not intended to replace advice given to you by your health care provider. Make sure you discuss any questions you have with your health care provider.

## 2014-12-04 ENCOUNTER — Ambulatory Visit: Payer: BLUE CROSS/BLUE SHIELD | Admitting: Family Medicine

## 2014-12-17 ENCOUNTER — Telehealth: Payer: Self-pay

## 2014-12-17 NOTE — Telephone Encounter (Signed)
Left a message for patient to return my call about having a Mammogram set up. Will await call back.  

## 2014-12-31 ENCOUNTER — Ambulatory Visit (INDEPENDENT_AMBULATORY_CARE_PROVIDER_SITE_OTHER): Payer: BLUE CROSS/BLUE SHIELD | Admitting: Primary Care

## 2014-12-31 ENCOUNTER — Encounter: Payer: Self-pay | Admitting: Primary Care

## 2014-12-31 VITALS — BP 122/78 | HR 77 | Temp 98.1°F | Ht 66.0 in | Wt 145.1 lb

## 2014-12-31 DIAGNOSIS — E559 Vitamin D deficiency, unspecified: Secondary | ICD-10-CM

## 2014-12-31 DIAGNOSIS — R1084 Generalized abdominal pain: Secondary | ICD-10-CM | POA: Diagnosis not present

## 2014-12-31 DIAGNOSIS — K219 Gastro-esophageal reflux disease without esophagitis: Secondary | ICD-10-CM

## 2014-12-31 DIAGNOSIS — M8580 Other specified disorders of bone density and structure, unspecified site: Secondary | ICD-10-CM

## 2014-12-31 DIAGNOSIS — Z13820 Encounter for screening for osteoporosis: Secondary | ICD-10-CM | POA: Diagnosis not present

## 2014-12-31 NOTE — Assessment & Plan Note (Signed)
Recent flare for past 2-3 weeks without improvement. Currently on omeprazole 40 mg. Will add Zantac 150 mg daily for 2 weeks. If no improvement will send to GI for repeat endoscopy as she has a history of ulceration. She is to call back in 2 weeks with an update.

## 2014-12-31 NOTE — Assessment & Plan Note (Signed)
Repeat vitamin D level today. Will evaluate once lab results.

## 2014-12-31 NOTE — Assessment & Plan Note (Signed)
Recheck vitamin  D level today.  

## 2014-12-31 NOTE — Patient Instructions (Addendum)
Complete lab work prior to leaving today. I will notify you of your results.  Please notify me if you develop increased headaches and dizziness in 2 weeks.  Start taking Zantac 150 mg daily for 2 weeks in addition to your omeprazole. Please notify me if your symptoms do not improve by then.  It was a pleasure to see you today!  Concussion A concussion, or closed-head injury, is a brain injury caused by a direct blow to the head or by a quick and sudden movement (jolt) of the head or neck. Concussions are usually not life-threatening. Even so, the effects of a concussion can be serious. If you have had a concussion before, you are more likely to experience concussion-like symptoms after a direct blow to the head.  CAUSES  Direct blow to the head, such as from running into another player during a soccer game, being hit in a fight, or hitting your head on a hard surface.  A jolt of the head or neck that causes the brain to move back and forth inside the skull, such as in a car crash. SIGNS AND SYMPTOMS The signs of a concussion can be hard to notice. Early on, they may be missed by you, family members, and health care providers. You may look fine but act or feel differently. Symptoms are usually temporary, but they may last for days, weeks, or even longer. Some symptoms may appear right away while others may not show up for hours or days. Every head injury is different. Symptoms include:  Mild to moderate headaches that will not go away.  A feeling of pressure inside your head.  Having more trouble than usual:  Learning or remembering things you have heard.  Answering questions.  Paying attention or concentrating.  Organizing daily tasks.  Making decisions and solving problems.  Slowness in thinking, acting or reacting, speaking, or reading.  Getting lost or being easily confused.  Feeling tired all the time or lacking energy (fatigued).  Feeling drowsy.  Sleep  disturbances.  Sleeping more than usual.  Sleeping less than usual.  Trouble falling asleep.  Trouble sleeping (insomnia).  Loss of balance or feeling lightheaded or dizzy.  Nausea or vomiting.  Numbness or tingling.  Increased sensitivity to:  Sounds.  Lights.  Distractions.  Vision problems or eyes that tire easily.  Diminished sense of taste or smell.  Ringing in the ears.  Mood changes such as feeling sad or anxious.  Becoming easily irritated or angry for little or no reason.  Lack of motivation.  Seeing or hearing things other people do not see or hear (hallucinations). DIAGNOSIS Your health care provider can usually diagnose a concussion based on a description of your injury and symptoms. He or she will ask whether you passed out (lost consciousness) and whether you are having trouble remembering events that happened right before and during your injury. Your evaluation might include:  A brain scan to look for signs of injury to the brain. Even if the test shows no injury, you may still have a concussion.  Blood tests to be sure other problems are not present. TREATMENT  Concussions are usually treated in an emergency department, in urgent care, or at a clinic. You may need to stay in the hospital overnight for further treatment.  Tell your health care provider if you are taking any medicines, including prescription medicines, over-the-counter medicines, and natural remedies. Some medicines, such as blood thinners (anticoagulants) and aspirin, may increase the chance of complications.  Also tell your health care provider whether you have had alcohol or are taking illegal drugs. This information may affect treatment.  Your health care provider will send you home with important instructions to follow.  How fast you will recover from a concussion depends on many factors. These factors include how severe your concussion is, what part of your brain was injured,  your age, and how healthy you were before the concussion.  Most people with mild injuries recover fully. Recovery can take time. In general, recovery is slower in older persons. Also, persons who have had a concussion in the past or have other medical problems may find that it takes longer to recover from their current injury. HOME CARE INSTRUCTIONS General Instructions  Carefully follow the directions your health care provider gave you.  Only take over-the-counter or prescription medicines for pain, discomfort, or fever as directed by your health care provider.  Take only those medicines that your health care provider has approved.  Do not drink alcohol until your health care provider says you are well enough to do so. Alcohol and certain other drugs may slow your recovery and can put you at risk of further injury.  If it is harder than usual to remember things, write them down.  If you are easily distracted, try to do one thing at a time. For example, do not try to watch TV while fixing dinner.  Talk with family members or close friends when making important decisions.  Keep all follow-up appointments. Repeated evaluation of your symptoms is recommended for your recovery.  Watch your symptoms and tell others to do the same. Complications sometimes occur after a concussion. Older adults with a brain injury may have a higher risk of serious complications, such as a blood clot on the brain.  Tell your teachers, school nurse, school counselor, coach, athletic trainer, or work Freight forwarder about your injury, symptoms, and restrictions. Tell them about what you can or cannot do. They should watch for:  Increased problems with attention or concentration.  Increased difficulty remembering or learning new information.  Increased time needed to complete tasks or assignments.  Increased irritability or decreased ability to cope with stress.  Increased symptoms.  Rest. Rest helps the brain to  heal. Make sure you:  Get plenty of sleep at night. Avoid staying up late at night.  Keep the same bedtime hours on weekends and weekdays.  Rest during the day. Take daytime naps or rest breaks when you feel tired.  Limit activities that require a lot of thought or concentration. These include:  Doing homework or job-related work.  Watching TV.  Working on the computer.  Avoid any situation where there is potential for another head injury (football, hockey, soccer, basketball, martial arts, downhill snow sports and horseback riding). Your condition will get worse every time you experience a concussion. You should avoid these activities until you are evaluated by the appropriate follow-up health care providers. Returning To Your Regular Activities You will need to return to your normal activities slowly, not all at once. You must give your body and brain enough time for recovery.  Do not return to sports or other athletic activities until your health care provider tells you it is safe to do so.  Ask your health care provider when you can drive, ride a bicycle, or operate heavy machinery. Your ability to react may be slower after a brain injury. Never do these activities if you are dizzy.  Ask your health care  provider about when you can return to work or school. Preventing Another Concussion It is very important to avoid another brain injury, especially before you have recovered. In rare cases, another injury can lead to permanent brain damage, brain swelling, or death. The risk of this is greatest during the first 7-10 days after a head injury. Avoid injuries by:  Wearing a seat belt when riding in a car.  Drinking alcohol only in moderation.  Wearing a helmet when biking, skiing, skateboarding, skating, or doing similar activities.  Avoiding activities that could lead to a second concussion, such as contact or recreational sports, until your health care provider says it is  okay.  Taking safety measures in your home.  Remove clutter and tripping hazards from floors and stairways.  Use grab bars in bathrooms and handrails by stairs.  Place non-slip mats on floors and in bathtubs.  Improve lighting in dim areas. SEEK MEDICAL CARE IF:  You have increased problems paying attention or concentrating.  You have increased difficulty remembering or learning new information.  You need more time to complete tasks or assignments than before.  You have increased irritability or decreased ability to cope with stress.  You have more symptoms than before. Seek medical care if you have any of the following symptoms for more than 2 weeks after your injury:  Lasting (chronic) headaches.  Dizziness or balance problems.  Nausea.  Vision problems.  Increased sensitivity to noise or light.  Depression or mood swings.  Anxiety or irritability.  Memory problems.  Difficulty concentrating or paying attention.  Sleep problems.  Feeling tired all the time. SEEK IMMEDIATE MEDICAL CARE IF:  You have severe or worsening headaches. These may be a sign of a blood clot in the brain.  You have weakness (even if only in one hand, leg, or part of the face).  You have numbness.  You have decreased coordination.  You vomit repeatedly.  You have increased sleepiness.  One pupil is larger than the other.  You have convulsions.  You have slurred speech.  You have increased confusion. This may be a sign of a blood clot in the brain.  You have increased restlessness, agitation, or irritability.  You are unable to recognize people or places.  You have neck pain.  It is difficult to wake you up.  You have unusual behavior changes.  You lose consciousness. MAKE SURE YOU:  Understand these instructions.  Will watch your condition.  Will get help right away if you are not doing well or get worse. Document Released: 08/05/2003 Document Revised:  05/20/2013 Document Reviewed: 12/05/2012 [facility] Patient Information 2015 [place], [place]. This information is not intended to replace advice given to you by your health care provider. Make sure you discuss any questions you have with your health care provider.

## 2014-12-31 NOTE — Progress Notes (Signed)
Subjective:    Patient ID: Valerie [name], female    DOB: 12/22/1950, 64 y.o.   MRN: [id number]  HPI  Valerie [name] is a 64 year old female who presents today with a chief complaint of epigastric pain. Her pain has been present for the past 2-3 weeks with worsening pain over the past several days. She describes her pain as burning that moves up and down from her epigastric region. She is managed on omeprazole 40 mg daily for GERD and doesn't feel as though it's helping. She has slight, intermittent nausea and recent constipation starting 1 month ago. She will have one bowel movement every 4 days.  2) Headache: Present to right temporal side of head since hitting her head this morning on a refrigerator door jam at work. She had one episode of dizziness in the lobby and continues to have a dull headache. Denies syncope. She noted a "knot" to the right temporal side of head since the incident.  Review of Systems  Constitutional: Negative for fever and chills.  Respiratory: Negative for shortness of breath.   Cardiovascular: Negative for chest pain.  Gastrointestinal: Positive for abdominal pain and constipation. Negative for blood in stool.  Neurological: Positive for dizziness and headaches.       Past Medical History  Diagnosis Date  . Hypertension   . Acid reflux   . Hernia     Present to proximal to umbillical   . Menorrhagia   . Osteopenia   . Vitamin D deficiency   . Atrophic vaginitis   . Adenocarcinoma of the endometrium/uterus 12/04/2000  . Vertigo   . PONV (postoperative nausea and vomiting)     History   Social History  . Marital Status: Married    Spouse Name: N/A  . Number of Children: N/A  . Years of Education: N/A   Occupational History  . Not on file.   Social History Main Topics  . Smoking status: Never Smoker   . Smokeless tobacco: Never Used  . Alcohol Use: No  . Drug Use: Not on file  . Sexual Activity: Not on file   Other Topics Concern  . 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       Past Surgical History  Procedure Laterality Date  . Tubal ligation    . Cholecystectomy  1997  . Cardiac catheterization  2006  . Hysteroscopy  11/15/2000    D & C, resection of endometrial polyps  . Dilation and curettage of uterus      11/15/2000  . Abdominal hysterectomy  12/04/2000    also bilateral salpingo-oophorectomy  . Oophorectomy  12/04/2000    bilateral salpingo-oophorectomy done with TAH  . Carpal tunnel release  11/20/2011    Procedure: CARPAL TUNNEL RELEASE;  Surgeon: Elaina Hoops, MD;  Location: [facility];  Service: Neurosurgery;  Laterality: Left;  LEFT carpal tunnel release    Family History  Problem Relation Age of Onset  . Cancer Father     prostate cancer  . Hypertension Father   . Hypertension Sister   . Diabetes Brother   . Heart disease Brother     Deceased   . Hypertension Brother   . Diabetes Brother   . Heart disease Brother     Deceased    No Known Allergies  Current Outpatient Prescriptions on File Prior to Visit  Medication Sig Dispense Refill  . aspirin EC 81 MG tablet Take 81 mg by mouth daily.    Marland Kitchen atorvastatin (LIPITOR) 10 MG tablet Take 10 mg by mouth daily.      . Calcium Carbonate-Vit D-Min (CALTRATE 600+D PLUS PO) Take 1 tablet by mouth 2 (two) times daily.      . fluticasone (FLONASE) 50 MCG/ACT nasal spray Place 2 sprays into the nose daily.      . metoprolol (TOPROL-XL) 200 MG 24 hr tablet Take 0.5 tablets (100 mg total) by mouth daily.    Marland Kitchen omeprazole (PRILOSEC) 40 MG capsule Take 1 capsule (40 mg total) by mouth daily. 30 capsule 5  . valsartan (DIOVAN) 320 MG tablet Take 320 mg by mouth daily.    . Vitamin D, Ergocalciferol, (DRISDOL) 50000 UNITS CAPS capsule Take 1 capsule (50,000 Units total) by mouth every 7 (seven) days. Take on Thursdays. 30 capsule 0   No  current facility-administered medications on file prior to visit.    BP 122/78 mmHg  Pulse 77  Temp(Src) 98.1 F (36.7 C) (Oral)  Ht 5\' 6"  (1.676 m)  Wt 145 lb 1.9 oz (65.826 kg)  BMI 23.43 kg/m2  SpO2 97%    Objective:   Physical Exam  Constitutional: She is oriented to person, place, and time. She appears well-nourished.  Eyes: EOM are normal. Pupils are equal, round, and reactive to light.  Cardiovascular: Normal rate and regular rhythm.   Pulmonary/Chest: Effort normal and breath sounds normal.  Abdominal: Soft. Bowel sounds are normal.  Generalized tenderness, more so tender to epigastric region.  Neurological: She is alert and oriented to person, place, and time. No cranial nerve deficit. Coordination normal.  Skin: Skin is warm and dry.  1 cm blue/purple raised nodule to right temporal region of head at site of impact. No bleeding.          Assessment & Plan:  Concussion:  S/p hitting head hard on freezer door knob at work. Denies syncope. Endorses dizziness and headaches. PERRL, neuro exam unremarkable. Discussed s/s of concussion and to notify me in 2 weeks if she continues to experience headaches and dizziness.

## 2014-12-31 NOTE — Progress Notes (Signed)
Pre visit review using our clinic review tool, if applicable. No additional management support is needed unless otherwise documented below in the visit note. 

## 2015-01-01 LAB — COMPREHENSIVE METABOLIC PANEL
ALT: 21 U/L (ref 0–35)
AST: 25 U/L (ref 0–37)
Albumin: 4.1 g/dL (ref 3.5–5.2)
Alkaline Phosphatase: 53 U/L (ref 39–117)
BUN: 22 mg/dL (ref 6–23)
CO2: 29 mEq/L (ref 19–32)
Calcium: 9.3 mg/dL (ref 8.4–10.5)
Chloride: 107 mEq/L (ref 96–112)
Creatinine, Ser: 0.94 mg/dL (ref 0.40–1.20)
GFR: 64.06 mL/min (ref >60.00)
Glucose, Bld: 92 mg/dL (ref 70–99)
Potassium: 3.9 mEq/L (ref 3.5–5.1)
Sodium: 143 mEq/L (ref 135–145)
Total Bilirubin: 0.6 mg/dL (ref 0.2–1.2)
Total Protein: 6.9 g/dL (ref 6.0–8.3)

## 2015-01-01 LAB — CBC
HCT: 37.4 % (ref 36.0–46.0)
Hemoglobin: 12.6 g/dL (ref 12.0–15.0)
MCHC: 33.6 g/dL (ref 30.0–36.0)
MCV: 93.7 fl (ref 78.0–100.0)
Platelets: 239 10*3/uL (ref 150.0–400.0)
RBC: 4 Mil/uL (ref 3.87–5.11)
RDW: 16.5 % — ABNORMAL HIGH (ref 11.5–15.5)
WBC: 6.6 10*3/uL (ref 4.0–10.5)

## 2015-01-01 LAB — VITAMIN D 25 HYDROXY (VIT D DEFICIENCY, FRACTURES): VITD: 45.47 ng/mL (ref 30.00–100.00)

## 2015-01-04 ENCOUNTER — Ambulatory Visit (INDEPENDENT_AMBULATORY_CARE_PROVIDER_SITE_OTHER): Payer: BLUE CROSS/BLUE SHIELD | Admitting: Family Medicine

## 2015-01-04 ENCOUNTER — Encounter: Payer: Self-pay | Admitting: Family Medicine

## 2015-01-04 VITALS — BP 136/88 | HR 60 | Temp 98.6°F | Ht 66.0 in | Wt 145.2 lb

## 2015-01-04 DIAGNOSIS — I10 Essential (primary) hypertension: Secondary | ICD-10-CM | POA: Diagnosis not present

## 2015-01-04 DIAGNOSIS — R002 Palpitations: Secondary | ICD-10-CM

## 2015-01-04 DIAGNOSIS — K219 Gastro-esophageal reflux disease without esophagitis: Secondary | ICD-10-CM

## 2015-01-04 DIAGNOSIS — R0789 Other chest pain: Secondary | ICD-10-CM | POA: Insufficient documentation

## 2015-01-04 MED ORDER — SUCRALFATE 1 G PO TABS: 1.0000 g | ORAL_TABLET | Freq: Three times a day (TID) | ORAL | Status: DC

## 2015-01-04 NOTE — Assessment & Plan Note (Signed)
BP well controlled today. Patient to continue home medications - metoprolol, Diovan.

## 2015-01-04 NOTE — Patient Instructions (Addendum)
It was nice to see you today.  Your chest pain is atypical and likely multifactorial (from GERD, possibly from arrhythmia, Musculoskeletal).  I have placed a referral to cardiology for you.   Additionally, I have prescribed another medication for your reflux.  Please take as prescribed.  Please follow up with your PCP in the next 1-2 weeks.  Take care  Dr. Lacinda Axon

## 2015-01-04 NOTE — Progress Notes (Signed)
Pre visit review using our clinic review tool, if applicable. No additional management support is needed unless otherwise documented below in the visit note. 

## 2015-01-04 NOTE — Assessment & Plan Note (Signed)
Examination EKG unremarkable today. Patient to see cardiology for further evaluation and potential Holter monitor. Referral placed.

## 2015-01-04 NOTE — Progress Notes (Signed)
Subjective:  Patient ID: COVA KNIERIEM, female    DOB: 1951-02-14  Age: 64 y.o. MRN: [id number]  CC: Palpitations/chest pain  HPI  64 year old female with a PMH of GAD and HTN presents to the clinic with complaints of palpitations/chest pain and recent elevated BP.  1) Palpitations  Patient reports a 1 month history of palpitations.    Patient states that she intermittently has periods where her heart is racing.  Last seconds to minutes and then resolved spontaneously.  No known inciting factor.  No exacerbating/relieving factors.  She has had some SOB and chest pain (see below).  2) Chest pain  Patient reports recent chest pain. Most recent episode was this morning (she states she has some pain currently).   She has been having this intermittently for the past few months.  Pain is substernal.    Moderate to severe.  Lasts minutes then resolves spontaneously.  No association with exertion. No relieving factors.  She reports the discomfort is worsened by her reflux.  No interventions tried other than her home reflux medications.  No associated nausea vomiting. No diaphoresis.  HTN  Uncontrolled per her report.   Home BP Monitoring - Yes. Elevated per her report.  Medications - Metoprolol, Diovan.  Compliance -  Yes.    Social Hx - Nonsmoker.   Review of Systems  Constitutional: Negative.   Respiratory: Positive for chest tightness and shortness of breath.   Cardiovascular: Positive for chest pain.  Gastrointestinal: Positive for abdominal pain.    Objective:  BP 136/88 mmHg  Pulse 60  Temp(Src) 98.6 F (37 C) (Oral)  Ht 5\' 6"  (1.676 m)  Wt 145 lb 4 oz (65.885 kg)  BMI 23.46 kg/m2  SpO2 95%  BP/Weight 01/04/2015 12/31/2014 7/82/9562  Systolic BP 130 865 784  Diastolic BP 88 78 79  Wt. (Lbs) 145.25 145.12 -  BMI 23.46 23.43 -     Physical Exam  Constitutional: She is oriented to person, place, and time. She appears well-developed and  well-nourished. No distress.  Cardiovascular: Regular rhythm.   No murmur heard. Bradycardic.   Pulmonary/Chest: Effort normal and breath sounds normal. No respiratory distress. She has no wheezes. She has no rales. She exhibits tenderness.  Anterior chest wall tender to palpation.  Abdominal: Soft. There is no rebound and no guarding.  Tender to palpation in the epigastrium.  Neurological: She is alert and oriented to person, place, and time.  Psychiatric: She has a normal mood and affect.  Vitals reviewed.   Lab Results  Component Value Date   WBC 6.6 12/31/2014   HGB 12.6 12/31/2014   HCT 37.4 12/31/2014   PLT 239.0 12/31/2014   GLUCOSE 92 12/31/2014   CHOL 128 10/06/2014   TRIG 64.0 10/06/2014   HDL 45.40 10/06/2014   LDLCALC 70 10/06/2014   ALT 21 12/31/2014   AST 25 12/31/2014   NA 143 12/31/2014   K 3.9 12/31/2014   CL 107 12/31/2014   CREATININE 0.94 12/31/2014   BUN 22 12/31/2014   CO2 29 12/31/2014   EKG - Sinus brady. No signs of ischemia.   Assessment & Plan:   Problem List Items Addressed This Visit    Essential hypertension    BP well controlled today. Patient to continue home medications - metoprolol, Diovan.      Acid reflux    Adding Carafate today. Rx given.      Relevant Medications   sucralfate (CARAFATE) 1 G tablet  Atypical chest pain    Unclear etiology. Likely multifactorial - GERD, PVC's/PAC's vs arrythmia, MSK. Patient has had this in the past and most recently had workup in June after visiting the ED. Workup there was negative. No red flags on exam. EKG ordered and reviewed personally by me today. Interpretation: Sinus bradycardia, no ST or T wave changes suggestive of ischemia normal intervals. Advised patient that she would benefit from cardiology referral for potential Holter monitor or stress test. Referral placed. Treating patient's GERD more aggressively with Carafate. Rx given today.       Relevant Orders   Ambulatory  referral to Cardiology   EKG 12-Lead (Completed)   Palpitations - Primary    Examination EKG unremarkable today. Patient to see cardiology for further evaluation and potential Holter monitor. Referral placed.      Relevant Orders   Ambulatory referral to Cardiology      Meds ordered this encounter  Medications  . sucralfate (CARAFATE) 1 G tablet    Sig: Take 1 tablet (1 g total) by mouth 4 (four) times daily -  with meals and at bedtime.    Dispense:  120 tablet    Refill:  0     Follow-up: Return if symptoms worsen or fail to improve.    Thersa Salt, DO

## 2015-01-04 NOTE — Assessment & Plan Note (Signed)
Adding Carafate today. Rx given.

## 2015-01-04 NOTE — Assessment & Plan Note (Signed)
Unclear etiology. Likely multifactorial - GERD, PVC's/PAC's vs arrythmia, MSK. Patient has had this in the past and most recently had workup in June after visiting the ED. Workup there was negative. No red flags on exam. EKG ordered and reviewed personally by me today. Interpretation: Sinus bradycardia, no ST or T wave changes suggestive of ischemia normal intervals. Advised patient that she would benefit from cardiology referral for potential Holter monitor or stress test. Referral placed. Treating patient's GERD more aggressively with Carafate. Rx given today.

## 2015-01-11 ENCOUNTER — Ambulatory Visit: Payer: BLUE CROSS/BLUE SHIELD | Admitting: Cardiovascular Disease

## 2015-01-14 ENCOUNTER — Telehealth: Payer: Self-pay | Admitting: Primary Care

## 2015-01-14 NOTE — Telephone Encounter (Signed)
Noted  

## 2015-01-14 NOTE — Telephone Encounter (Signed)
Called and spoken to patient. Patient stated that she is ok overall. There are some headache and dizziness. Patient will call if feels worse.

## 2015-01-14 NOTE — Telephone Encounter (Signed)
Will you check on Valerie [name] today? Ask if she's having headaches, dizziness, or pain. Thanks!

## 2015-02-11 ENCOUNTER — Ambulatory Visit (INDEPENDENT_AMBULATORY_CARE_PROVIDER_SITE_OTHER): Payer: BLUE CROSS/BLUE SHIELD | Admitting: Cardiovascular Disease

## 2015-02-11 ENCOUNTER — Encounter (INDEPENDENT_AMBULATORY_CARE_PROVIDER_SITE_OTHER): Payer: Self-pay

## 2015-02-11 ENCOUNTER — Telehealth: Payer: Self-pay

## 2015-02-11 ENCOUNTER — Encounter: Payer: Self-pay | Admitting: Cardiovascular Disease

## 2015-02-11 VITALS — BP 110/76 | HR 73 | Ht 65.0 in | Wt 146.0 lb

## 2015-02-11 DIAGNOSIS — R002 Palpitations: Secondary | ICD-10-CM | POA: Diagnosis not present

## 2015-02-11 DIAGNOSIS — R0789 Other chest pain: Secondary | ICD-10-CM | POA: Diagnosis not present

## 2015-02-11 DIAGNOSIS — I10 Essential (primary) hypertension: Secondary | ICD-10-CM

## 2015-02-11 NOTE — Assessment & Plan Note (Signed)
Blood pressure is well controlled on current medications. 

## 2015-02-11 NOTE — Patient Instructions (Addendum)
Medication Instructions:  Your physician recommends that you continue on your current medications as directed. Please refer to the Current Medication list given to you today.   Labwork: none  Testing/Procedures: Your physician has requested that you have a lexiscan myoview. For further information please visit [email or web address]. Please follow instruction sheet, as given.  [place]  Your caregiver has ordered a Stress Test with nuclear imaging. The purpose of this test is to evaluate the blood supply to your heart muscle. This procedure is referred to as a "Non-Invasive Stress Test." This is because other than having an IV started in your vein, nothing is inserted or "invades" your body. Cardiac stress tests are done to find areas of poor blood flow to the heart by determining the extent of coronary artery disease (CAD). Some patients exercise on a treadmill, which naturally increases the blood flow to your heart, while others who are  unable to walk on a treadmill due to physical limitations have a pharmacologic/chemical stress agent called Lexiscan . This medicine will mimic walking on a treadmill by temporarily increasing your coronary blood flow.   Please note: these test may take anywhere between 2-4 hours to complete  PLEASE REPORT TO [facility] AT THE FIRST DESK WILL DIRECT YOU WHERE TO GO  Date of Procedure:__Monday, Sept 19, 8:00am___________________________________  Arrival Time for Procedure:_____7:30am________________________  Instructions regarding medication:    _xx___:  Hold metoprolol night before procedure and morning of procedure   PLEASE NOTIFY THE OFFICE AT LEAST 24 HOURS IN ADVANCE IF YOU ARE UNABLE TO KEEP YOUR APPOINTMENT.  [phone] AND  PLEASE NOTIFY NUCLEAR MEDICINE AT [facility] AT LEAST 24 HOURS IN ADVANCE IF YOU ARE UNABLE TO KEEP YOUR APPOINTMENT. [phone]  How to prepare for your Myoview test:   Do not eat or  drink after midnight  No caffeine for 24 hours prior to test  No smoking 24 hours prior to test.  Your medication may be taken with water.  If your doctor stopped a medication because of this test, do not take that medication.  Ladies, please do not wear dresses.  Skirts or pants are appropriate. Please wear a short sleeve shirt.  No perfume, cologne or lotion.  Wear comfortable walking shoes. No heels!            Follow-Up: Your physician recommends that you schedule a follow-up appointment as needed with Dr. Fletcher Anon.    Any Other Special Instructions Will Be Listed Below (If Applicable).  Nuclear Medicine Exam A nuclear medicine exam is a safe and painless imaging test. It helps to detect and diagnose disease in the body as well as provide information about organ function and structure.  Nuclear scans are most often done of the:  Lungs.  Heart.  Thyroid gland.  Bones.  Abdomen. HOW A NUCLEAR MEDICINE EXAM WORKS A nuclear medicine exam works by using a radioactive tracer. The material is given either by an IV (intravenous) injection or it may be swallowed. After the tracer is in the body, it is absorbed by your body's organs. A large scanning machine that uses a special camera detects the radioactivity in your body. A computerized image is then formed regarding the area of concern. The small amounts of radioactive material used in a nuclear medicine exam are found to be medically safe. However, because radioactive material is used, this test is not done if you are pregnant or nursing.  BEFORE THE PROCEDURE  If available, bring previous imaging  studies such as x-rays, etc. with you to the exam.  Arrive early for your exam. PROCEDURE  An IV may be started before the exam begins.  Depending on the type of examination, will lie on a table or sit in a chair during the exam.  The nuclear medicine exam will take about 30 to 60 minutes to complete. AFTER THE  PROCEDURE  After your scan is completed, the image(s) will be evaluated by a specialist. It is important that you follow up with your caregiver to find out your test results.  You may return to your regular activity as instructed by your caregiver. SEEK IMMEDIATE MEDICAL CARE IF: You have shortness of breath or difficulty breathing. MAKE SURE YOU:   Understand these instructions.  Will watch your condition.  Will get help right away if you are not doing well or get worse. Document Released: 06/22/2004 Document Revised: 08/07/2011 Document Reviewed: 08/06/2008 [facility] Patient Information 2015 [place], [place]. This information is not intended to replace advice given to you by your health care provider. Make sure you discuss any questions you have with your health care provider.

## 2015-02-11 NOTE — Assessment & Plan Note (Signed)
The chest pain is overall atypical and could be related to her known history of GERD. However, she has multiple risk factors for coronary artery disease and reports having mild coronary atherosclerosis on previous cardiac catheterization more than 10 years ago. Thus, I recommend evaluation with a pharmacologic nuclear stress test. She is not able to exercise on a treadmill.

## 2015-02-11 NOTE — Progress Notes (Signed)
HPI  This is a pleasant 64 year old female who was referred for evaluation of chest pain. She reports having cardiac workup done by Dr. Humphrey Rolls more than 10 years ago. She was told about mild leaking valve. Cardiac catheterization according to the patient showed mild plaque formation without obstructive disease. She has known history of hypertension, hyperlipidemia and GERD. She is not a smoker. She does have family history of coronary artery disease. Over the last few months, she has experienced intermittent substernal chest pain described as tightness lasting for a few minutes. This is usually aggravated by certain types of food and occasionally gets worse with physical activities but is not consistent. She reports mild exertional dyspnea.  No Known Allergies   Current Outpatient Prescriptions on File Prior to Visit  Medication Sig Dispense Refill  . aspirin EC 81 MG tablet Take 81 mg by mouth daily.    Marland Kitchen atorvastatin (LIPITOR) 10 MG tablet Take 10 mg by mouth daily.      . Calcium Carbonate-Vit D-Min (CALTRATE 600+D PLUS PO) Take 1 tablet by mouth 2 (two) times daily.      . fluticasone (FLONASE) 50 MCG/ACT nasal spray Place 2 sprays into the nose daily.      . metoprolol (TOPROL-XL) 200 MG 24 hr tablet Take 0.5 tablets (100 mg total) by mouth daily.    Marland Kitchen omeprazole (PRILOSEC) 40 MG capsule Take 1 capsule (40 mg total) by mouth daily. 30 capsule 5  . sucralfate (CARAFATE) 1 G tablet Take 1 tablet (1 g total) by mouth 4 (four) times daily -  with meals and at bedtime. 120 tablet 0  . valsartan (DIOVAN) 320 MG tablet Take 320 mg by mouth daily.    . Vitamin D, Ergocalciferol, (DRISDOL) 50000 UNITS CAPS capsule Take 1 capsule (50,000 Units total) by mouth every 7 (seven) days. Take on Thursdays. 30 capsule 0   No current facility-administered medications on file prior to visit.     Past Medical History  Diagnosis Date  . Hypertension   . Acid reflux   . Hernia     Present to proximal to  umbillical   . Menorrhagia   . Osteopenia   . Vitamin D deficiency   . Atrophic vaginitis   . Adenocarcinoma of the endometrium/uterus 12/04/2000  . Vertigo   . PONV (postoperative nausea and vomiting)      Past Surgical History  Procedure Laterality Date  . Tubal ligation    . Cholecystectomy  1997  . Hysteroscopy  11/15/2000    D & C, resection of endometrial polyps  . Dilation and curettage of uterus      11/15/2000  . Abdominal hysterectomy  12/04/2000    also bilateral salpingo-oophorectomy  . Oophorectomy  12/04/2000    bilateral salpingo-oophorectomy done with TAH  . Carpal tunnel release  11/20/2011    Procedure: CARPAL TUNNEL RELEASE;  Surgeon: Elaina Hoops, MD;  Location: [place] [facility];  Service: Neurosurgery;  Laterality: Left;  LEFT carpal tunnel release  . Cardiac catheterization  2006     Family History  Problem Relation Age of Onset  . Cancer Father     prostate cancer  . Hypertension Father   . Hypertension Sister   . Diabetes Brother   . Heart disease Brother     Deceased   . Hypertension Brother   . Diabetes Brother   . Heart disease Brother     Deceased     Social History   Social History  .  Marital Status: Married    Spouse Name: N/A  . Number of Children: N/A  . Years of Education: N/A   Occupational History  . Not on file.   Social History Main Topics  . Smoking status: Never Smoker   . Smokeless tobacco: Never Used  . Alcohol Use: No  . Drug Use: Not on file  . Sexual Activity: Not on file   Other Topics Concern  . 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        ROS A 10 point review of system was performed. It is negative other than that mentioned in the history of present illness.   PHYSICAL EXAM   BP 110/76 mmHg  Pulse 73  Ht 5\' 5"  (1.651 m)  Wt 146 lb (66.225 kg)  BMI 24.30  kg/m2 Constitutional: She is oriented to person, place, and time. She appears well-developed and well-nourished. No distress.  HENT: No nasal discharge.  Head: Normocephalic and atraumatic.  Eyes: Pupils are equal and round. No discharge.  Neck: Normal range of motion. Neck supple. No JVD present. No thyromegaly present.  Cardiovascular: Normal rate, regular rhythm, normal heart sounds. Exam reveals no gallop and no friction rub. No murmur heard.  Pulmonary/Chest: Effort normal and breath sounds normal. No stridor. No respiratory distress. She has no wheezes. She has no rales. She exhibits no tenderness.  Abdominal: Soft. Bowel sounds are normal. She exhibits no distension. There is no tenderness. There is no rebound and no guarding.  Musculoskeletal: Normal range of motion. She exhibits no edema and no tenderness.  Neurological: She is alert and oriented to person, place, and time. Coordination normal.  Skin: Skin is warm and dry. No rash noted. She is not diaphoretic. No erythema. No pallor.  Psychiatric: She has a normal mood and affect. Her behavior is normal. Judgment and thought content normal.     EKG: Normal sinus rhythm with no significant ST or T wave changes.   ASSESSMENT AND PLAN

## 2015-02-11 NOTE — Telephone Encounter (Signed)
Pt left v/m requesting cb (that was entire message). Left v/m requesting cb. 

## 2015-02-11 NOTE — Telephone Encounter (Signed)
Pt called back and will ck with pharmacy about refills;if needed pharmacy will contact office.

## 2015-02-15 ENCOUNTER — Ambulatory Visit
Admission: RE | Admit: 2015-02-15 | Discharge: 2015-02-15 | Disposition: A | Discharge status: Home / Self Care | Payer: BLUE CROSS/BLUE SHIELD | Source: Ambulatory Visit | Attending: Cardiovascular Disease | Admitting: Cardiovascular Disease

## 2015-02-15 DIAGNOSIS — R0789 Other chest pain: Secondary | ICD-10-CM | POA: Diagnosis not present

## 2015-02-15 LAB — NM MYOCAR MULTI W/SPECT W/WALL MOTION / EF
LV dias vol: 26 mL
LV sys vol: 9 mL
Peak HR: 137 {beats}/min
Percent HR: 86 %
Rest HR: 77 {beats}/min
SDS: 0
SRS: 0
SSS: 0
TID: 0.73

## 2015-02-15 MED ORDER — TECHNETIUM TC 99M SESTAMIBI - CARDIOLITE
33.0000 | Freq: Once | INTRAVENOUS | Status: AC | PRN
  Administered 2015-02-15: 31.83 via INTRAVENOUS

## 2015-02-15 MED ORDER — REGADENOSON 0.4 MG/5ML IV SOLN
0.4000 mg | Freq: Once | INTRAVENOUS | Status: AC
  Administered 2015-02-15: 0.4 mg via INTRAVENOUS
  Filled 2015-02-15: qty 5

## 2015-02-15 MED ORDER — TECHNETIUM TC 99M SESTAMIBI - CARDIOLITE
13.0000 | Freq: Once | INTRAVENOUS | Status: AC | PRN
  Administered 2015-02-15: 08:00:00 12.09 via INTRAVENOUS

## 2015-03-16 ENCOUNTER — Other Ambulatory Visit: Payer: Self-pay | Admitting: *Deleted

## 2015-03-16 MED ORDER — ATORVASTATIN CALCIUM 10 MG PO TABS: 10.0000 mg | ORAL_TABLET | Freq: Every day | ORAL | Status: DC

## 2015-03-16 NOTE — Telephone Encounter (Signed)
Patient called stating that she needs for you to send a new script to [facility] for her Lipitor. Patient stated that this was filled by her previous doctor. Last lipid 10/06/14, last office visit 12/31/14.

## 2015-04-26 ENCOUNTER — Other Ambulatory Visit: Payer: Self-pay

## 2015-04-26 DIAGNOSIS — K219 Gastro-esophageal reflux disease without esophagitis: Secondary | ICD-10-CM

## 2015-04-26 MED ORDER — OMEPRAZOLE 40 MG PO CPDR: 40.0000 mg | DELAYED_RELEASE_CAPSULE | Freq: Every day | ORAL | Status: DC

## 2015-04-26 NOTE — Telephone Encounter (Signed)
Pt request 90 day refill omeprazole to [facility]. Per protocol advised pt done. Last seen 12/31/14.

## 2015-05-18 ENCOUNTER — Ambulatory Visit (INDEPENDENT_AMBULATORY_CARE_PROVIDER_SITE_OTHER): Payer: BLUE CROSS/BLUE SHIELD | Admitting: Primary Care

## 2015-05-18 ENCOUNTER — Encounter: Payer: Self-pay | Admitting: Primary Care

## 2015-05-18 VITALS — BP 110/72 | HR 54 | Temp 97.3°F | Ht 66.0 in | Wt 151.1 lb

## 2015-05-18 DIAGNOSIS — K219 Gastro-esophageal reflux disease without esophagitis: Secondary | ICD-10-CM

## 2015-05-18 DIAGNOSIS — M542 Cervicalgia: Secondary | ICD-10-CM

## 2015-05-18 DIAGNOSIS — I10 Essential (primary) hypertension: Secondary | ICD-10-CM | POA: Diagnosis not present

## 2015-05-18 MED ORDER — SUCRALFATE 1 G PO TABS
1.0000 g | ORAL_TABLET | Freq: Three times a day (TID) | ORAL | Status: DC
Start: 2015-05-18 — End: 2015-05-20

## 2015-05-18 MED ORDER — TIZANIDINE HCL 2 MG PO TABS: 2.0000 mg | ORAL_TABLET | Freq: Three times a day (TID) | ORAL | Status: DC | PRN

## 2015-05-18 MED ORDER — METOPROLOL SUCCINATE ER 50 MG PO TB24: 50.0000 mg | ORAL_TABLET | Freq: Every day | ORAL | Status: DC

## 2015-05-18 NOTE — Patient Instructions (Signed)
We've decreased your Toprol XL from 100 mg to 50 mg. I've sent the new prescription to your pharmacy.  Start Tizanidine 2 mg tablets for neck pain and muscle spasm. Take 1 tablet by mouth every 8 hours as needed. Caution as this may make you drowsy.  Refrain from lifting heavy boxes over your head if possible.  Please keep an eye on your blood pressure and notify me if you notice it any higher than 150/90.  Follow up in May for repeat physical or sooner if needed.  It was a pleasure to see you today!  Cervical Sprain A cervical sprain is an injury in the neck in which the strong, fibrous tissues (ligaments) that connect your neck bones stretch or tear. Cervical sprains can range from mild to severe. Severe cervical sprains can cause the neck vertebrae to be unstable. This can lead to damage of the spinal cord and can result in serious nervous system problems. The amount of time it takes for a cervical sprain to get better depends on the cause and extent of the injury. Most cervical sprains heal in 1 to 3 weeks. CAUSES  Severe cervical sprains may be caused by:   Contact sport injuries (such as from football, rugby, wrestling, hockey, auto racing, gymnastics, diving, martial arts, or boxing).   Motor vehicle collisions.   Whiplash injuries. This is an injury from a sudden forward and backward whipping movement of the head and neck.  Falls.  Mild cervical sprains may be caused by:   Being in an awkward position, such as while cradling a telephone between your ear and shoulder.   Sitting in a chair that does not offer proper support.   Working at a poorly Landscape architect station.   Looking up or down for long periods of time.  SYMPTOMS   Pain, soreness, stiffness, or a burning sensation in the front, back, or sides of the neck. This discomfort may develop immediately after the injury or slowly, 24 hours or more after the injury.   Pain or tenderness directly in the middle  of the back of the neck.   Shoulder or upper back pain.   Limited ability to move the neck.   Headache.   Dizziness.   Weakness, numbness, or tingling in the hands or arms.   Muscle spasms.   Difficulty swallowing or chewing.   Tenderness and swelling of the neck.  DIAGNOSIS  Most of the time your health care provider can diagnose a cervical sprain by taking your history and doing a physical exam. Your health care provider will ask about previous neck injuries and any known neck problems, such as arthritis in the neck. X-rays may be taken to find out if there are any other problems, such as with the bones of the neck. Other tests, such as a CT scan or MRI, may also be needed.  TREATMENT  Treatment depends on the severity of the cervical sprain. Mild sprains can be treated with rest, keeping the neck in place (immobilization), and pain medicines. Severe cervical sprains are immediately immobilized. Further treatment is done to help with pain, muscle spasms, and other symptoms and may include:  Medicines, such as pain relievers, numbing medicines, or muscle relaxants.   Physical therapy. This may involve stretching exercises, strengthening exercises, and posture training. Exercises and improved posture can help stabilize the neck, strengthen muscles, and help stop symptoms from returning.  HOME CARE INSTRUCTIONS   Put ice on the injured area.   Put ice  in a plastic bag.   Place a towel between your skin and the bag.   Leave the ice on for 15-20 minutes, 3-4 times a day.   If your injury was severe, you may have been given a cervical collar to wear. A cervical collar is a two-piece collar designed to keep your neck from moving while it heals.  Do not remove the collar unless instructed by your health care provider.  If you have long hair, keep it outside of the collar.  Ask your health care provider before making any adjustments to your collar. Minor adjustments  may be required over time to improve comfort and reduce pressure on your chin or on the back of your head.  Ifyou are allowed to remove the collar for cleaning or bathing, follow your health care provider's instructions on how to do so safely.  Keep your collar clean by wiping it with mild soap and water and drying it completely. If the collar you have been given includes removable pads, remove them every 1-2 days and hand wash them with soap and water. Allow them to air dry. They should be completely dry before you wear them in the collar.  If you are allowed to remove the collar for cleaning and bathing, wash and dry the skin of your neck. Check your skin for irritation or sores. If you see any, tell your health care provider.  Do not drive while wearing the collar.   Only take over-the-counter or prescription medicines for pain, discomfort, or fever as directed by your health care provider.   Keep all follow-up appointments as directed by your health care provider.   Keep all physical therapy appointments as directed by your health care provider.   Make any needed adjustments to your workstation to promote good posture.   Avoid positions and activities that make your symptoms worse.   Warm up and stretch before being active to help prevent problems.  SEEK MEDICAL CARE IF:   Your pain is not controlled with medicine.   You are unable to decrease your pain medicine over time as planned.   Your activity level is not improving as expected.  SEEK IMMEDIATE MEDICAL CARE IF:   You develop any bleeding.  You develop stomach upset.  You have signs of an allergic reaction to your medicine.   Your symptoms get worse.   You develop new, unexplained symptoms.   You have numbness, tingling, weakness, or paralysis in any part of your body.  MAKE SURE YOU:   Understand these instructions.  Will watch your condition.  Will get help right away if you are not doing well  or get worse.   This information is not intended to replace advice given to you by your health care provider. Make sure you discuss any questions you have with your health care provider.   Document Released: 03/12/2007 Document Revised: 05/20/2013 Document Reviewed: 11/20/2012 Elsevier Interactive Patient Education [employer].

## 2015-05-18 NOTE — Progress Notes (Signed)
Subjective:    Patient ID: Valerie [name], female    DOB: 1951-03-14, 64 y.o.   MRN: [id number]  HPI  Valerie [name] is a 63 year old female who presents today with a chief complaint of neck pain. She has a history of neck surgery 4 years ago with the placement of 3 screws to her cervical spine. She's noticed increased pressure/pain to her neck for the past 3-4 weeks. She does lifting of 30-40 pounds over her head daily at her occupation. Her pain is worse during the day while at work. Her pain is improved in the morning. She's taken ibuprofen 2-3 times daily for past several weeks with temporary improvement. Denies numbness/tingling, radiation of pain down extremities, recent injury/trauma.  Review of Systems  Musculoskeletal: Positive for myalgias and neck pain. Negative for neck stiffness.  Neurological: Negative for dizziness and headaches.       Past Medical History  Diagnosis Date  . Hypertension   . Acid reflux   . Hernia     Present to proximal to umbillical   . Menorrhagia   . Osteopenia   . Vitamin D deficiency   . Atrophic vaginitis   . Adenocarcinoma of the endometrium/uterus ([place]) 12/04/2000  . Vertigo   . PONV (postoperative nausea and vomiting)     Social History   Social History  . Marital Status: Married    Spouse Name: N/A  . Number of Children: N/A  . Years of Education: N/A   Occupational History  . Not on file.   Social History Main Topics  . Smoking status: Never Smoker   . Smokeless tobacco: Never Used  . Alcohol Use: No  . Drug Use: Not on file  . Sexual Activity: Not on file   Other Topics Concern  . 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       Past Surgical History  Procedure Laterality Date  . Tubal ligation    . Cholecystectomy  1997  . Hysteroscopy  11/15/2000    D & C, resection of endometrial polyps  . Dilation  and curettage of uterus      11/15/2000  . Abdominal hysterectomy  12/04/2000    also bilateral salpingo-oophorectomy  . Oophorectomy  12/04/2000    bilateral salpingo-oophorectomy done with TAH  . Carpal tunnel release  11/20/2011    Procedure: CARPAL TUNNEL RELEASE;  Surgeon: Elaina Hoops, MD;  Location: [facility];  Service: Neurosurgery;  Laterality: Left;  LEFT carpal tunnel release  . Cardiac catheterization  2006    Family History  Problem Relation Age of Onset  . Cancer Father     prostate cancer  . Hypertension Father   . Hypertension Sister   . Diabetes Brother   . Heart disease Brother     Deceased   . Hypertension Brother   . Diabetes Brother   . Heart disease Brother     Deceased    No Known Allergies  Current Outpatient Prescriptions on File Prior to Visit  Medication Sig Dispense Refill  . aspirin EC 81 MG tablet Take 81 mg by mouth daily.    Marland Kitchen atorvastatin (LIPITOR) 10 MG tablet Take 1 tablet (10 mg total) by mouth daily. 90 tablet 1  . Calcium Carbonate-Vit D-Min (CALTRATE 600+D PLUS PO) Take 1 tablet by mouth 2 (  two) times daily.      . fluticasone (FLONASE) 50 MCG/ACT nasal spray Place 2 sprays into the nose daily.      Marland Kitchen omeprazole (PRILOSEC) 40 MG capsule Take 1 capsule (40 mg total) by mouth daily. 90 capsule 1  . valsartan (DIOVAN) 320 MG tablet Take 320 mg by mouth daily.    . Vitamin D, Ergocalciferol, (DRISDOL) 50000 UNITS CAPS capsule Take 1 capsule (50,000 Units total) by mouth every 7 (seven) days. Take on Thursdays. 30 capsule 0   No current facility-administered medications on file prior to visit.    BP 110/72 mmHg  Pulse 54  Temp(Src) 97.3 F (36.3 C) (Oral)  Ht 5\' 6"  (1.676 m)  Wt 151 lb 1.9 oz (68.548 kg)  BMI 24.40 kg/m2  SpO2 98%    Objective:   Physical Exam  Constitutional: She appears well-nourished.  Neck: Muscular tenderness present. No rigidity. Decreased range of motion present.  Decrease in ROM to flexion, extension,  lateral movements due to pain. Tenderness to trapezius muscles.  Cardiovascular: Normal rate and regular rhythm.   Pulmonary/Chest: Effort normal and breath sounds normal.  Skin: Skin is warm and dry.          Assessment & Plan:  Cervical Strain:  Neck pain x 3 weeks, worse when at work, improved with rest. Lifts heavy boxes over her head daily at work. Suspect cervical strain due to repetitive lifting and movements. Exam with decrease in ROM to neck and MSK tenderness to neck and upper back. Will treat with supportive measures for now. RX for tizanidine provided. Instructions for ice/heat, exercises, and stretching provided. Suggested to stop lifting heavy boxes for several weeks to allow for neck to relax. She is to update me if no improvement with the supportive measure above. Will consider xray of cervical spine.

## 2015-05-18 NOTE — Progress Notes (Signed)
Pre visit review using our clinic review tool, if applicable. No additional management support is needed unless otherwise documented below in the visit note. 

## 2015-05-19 ENCOUNTER — Telehealth: Payer: Self-pay

## 2015-05-19 NOTE — Telephone Encounter (Signed)
Pt wants meds sent to [facility] on 05/18/15 sent to [address]. Pt will contact [facility] [address] to have rxs transferred.

## 2015-05-20 ENCOUNTER — Other Ambulatory Visit: Payer: Self-pay

## 2015-05-20 DIAGNOSIS — K219 Gastro-esophageal reflux disease without esophagitis: Secondary | ICD-10-CM

## 2015-05-20 MED ORDER — SUCRALFATE 1 G PO TABS: 1.0000 g | ORAL_TABLET | Freq: Three times a day (TID) | ORAL | Status: DC

## 2015-05-20 NOTE — Telephone Encounter (Signed)
Pt request refill carafate to [facility] [address]; spoke with [place] at [place] [facility] [place] he cannot transfer because he cannot find carafate in his system.sending refill to [facility] [address] electronically. Pt voiced understanding.

## 2015-05-27 ENCOUNTER — Telehealth: Payer: Self-pay

## 2015-05-27 NOTE — Telephone Encounter (Signed)
Is this the first BP she's checked? If not, then what were her prior readings since her visit on 12/20? Was she resting?

## 2015-05-27 NOTE — Telephone Encounter (Signed)
Pt was seen 05/18/15 and was to call back if BP was 150/90. Pt took BP today about one hour ago and BP was 149/90. No CP,SOB, dizziness or H/A. Pt request cb with further instructions.

## 2015-05-27 NOTE — Telephone Encounter (Signed)
Message left for patient to return my call.  

## 2015-05-28 MED ORDER — HYDROCHLOROTHIAZIDE 25 MG PO TABS: 25.0000 mg | ORAL_TABLET | Freq: Every day | ORAL | Status: DC

## 2015-05-28 NOTE — Telephone Encounter (Signed)
Pt called back; pt does not have her BP readings with her but BP was averaging 148/90; pt said usually she was resting when taking BP. Today BP was lower 137/80. No H/A, dizziness, CP or SOB. Pt presently taking Valsartan 320 mg one daily and metoprolol succinate 50 mg 24 hr tab one daily. [facility] [address]Pt request cb. Allie Bossier NP not in office; sent note to Dr Eliezer Lofts.

## 2015-05-28 NOTE — Telephone Encounter (Signed)
She is on max valsartan and metoprolol was decreased to 50 due to low pulse. To get better control of BP.Valerie [name] A new med would need to be started in addition to these. Has she been on any meds in past? I could add an new one or this is non urgent so could wait for [name] to return next week.

## 2015-05-28 NOTE — Telephone Encounter (Signed)
Spoke with Valerie [name].  She states at one point she was taking 200 mg of metoprolol then she lost about 30 lbs and her dosage was decreased to 50 mg.  She also states that she has taken Lisinopril in the past also.  She would like to go ahead and get started on a new BP medication.  Please advise.

## 2015-05-28 NOTE — Telephone Encounter (Signed)
Ms. Rabon notified as instructed.  HCTZ prescription sent into [facility]  She will call back to schedule appointment with [name] to follow up on her BP.

## 2015-05-28 NOTE — Telephone Encounter (Signed)
No history gout or electrolyte issues.   She is already on a med similar to lisinopril.  Would instead recommend startingHCTZ 25 mg daily. 1 tab po daily. # 30, 3 RF. Please send in. She will need BP check follow up with [name] if she does not have already.

## 2015-06-07 ENCOUNTER — Ambulatory Visit: Payer: BLUE CROSS/BLUE SHIELD | Admitting: Primary Care

## 2015-06-30 ENCOUNTER — Ambulatory Visit (INDEPENDENT_AMBULATORY_CARE_PROVIDER_SITE_OTHER): Payer: BLUE CROSS/BLUE SHIELD | Admitting: Primary Care

## 2015-06-30 ENCOUNTER — Encounter: Payer: Self-pay | Admitting: Primary Care

## 2015-06-30 VITALS — BP 118/74 | HR 76 | Temp 97.6°F | Ht 66.0 in | Wt 152.8 lb

## 2015-06-30 DIAGNOSIS — K649 Unspecified hemorrhoids: Secondary | ICD-10-CM

## 2015-06-30 DIAGNOSIS — R319 Hematuria, unspecified: Secondary | ICD-10-CM | POA: Diagnosis not present

## 2015-06-30 DIAGNOSIS — N390 Urinary tract infection, site not specified: Secondary | ICD-10-CM | POA: Diagnosis not present

## 2015-06-30 DIAGNOSIS — R300 Dysuria: Secondary | ICD-10-CM | POA: Diagnosis not present

## 2015-06-30 LAB — POC URINALSYSI DIPSTICK (AUTOMATED)
Bilirubin, UA: NEGATIVE
Glucose, UA: NEGATIVE
Ketones, UA: NEGATIVE
Nitrite, UA: NEGATIVE
Protein, UA: NEGATIVE
Spec Grav, UA: >=1.03
Urobilinogen, UA: NEGATIVE
pH, UA: 6

## 2015-06-30 MED ORDER — SULFAMETHOXAZOLE-TRIMETHOPRIM 800-160 MG PO TABS: 1.0000 | ORAL_TABLET | Freq: Two times a day (BID) | ORAL | Status: DC

## 2015-06-30 MED ORDER — HYDROCORTISONE 2.5 % RE CREA: 1.0000 "application " | TOPICAL_CREAM | Freq: Two times a day (BID) | RECTAL | Status: DC

## 2015-06-30 MED ORDER — PHENAZOPYRIDINE HCL 200 MG PO TABS: 200.0000 mg | ORAL_TABLET | Freq: Three times a day (TID) | ORAL | Status: DC | PRN

## 2015-06-30 NOTE — Assessment & Plan Note (Signed)
History of in past. External and internal noted on exam today. Non thrombosed, no bleeding. Anusol HC cream sent to pharmacy with instructions. She is to notify me if no improvement.

## 2015-06-30 NOTE — Patient Instructions (Addendum)
Start Bactrim DS (sulfamethoxazole/trimethoprim) tablets for urinary tract infection. Take 1 tablet by mouth twice daily for 5 days.  You may take the pyridium three times daily as needed for pelvic discomfort and urinary burning.  Apply the rectal cream to hemorrhoids twice daily. Try to apply some into the rectum as discussed.   Please call me if no improvement in hemorrhoids in the next week.  Increase consumption of water.  It was a pleasure to see you today!  Urinary Tract Infection Urinary tract infections (UTIs) can develop anywhere along your urinary tract. Your urinary tract is your body's drainage system for removing wastes and extra water. Your urinary tract includes two kidneys, two ureters, a bladder, and a urethra. Your kidneys are a pair of bean-shaped organs. Each kidney is about the size of your fist. They are located below your ribs, one on each side of your spine. CAUSES Infections are caused by microbes, which are microscopic organisms, including fungi, viruses, and bacteria. These organisms are so small that they can only be seen through a microscope. Bacteria are the microbes that most commonly cause UTIs. SYMPTOMS  Symptoms of UTIs may vary by age and gender of the patient and by the location of the infection. Symptoms in young women typically include a frequent and intense urge to urinate and a painful, burning feeling in the bladder or urethra during urination. Older women and men are more likely to be tired, shaky, and weak and have muscle aches and abdominal pain. A fever may mean the infection is in your kidneys. Other symptoms of a kidney infection include pain in your back or sides below the ribs, nausea, and vomiting. DIAGNOSIS To diagnose a UTI, your caregiver will ask you about your symptoms. Your caregiver will also ask you to provide a urine sample. The urine sample will be tested for bacteria and white blood cells. White blood cells are made by your body to help  fight infection. TREATMENT  Typically, UTIs can be treated with medication. Because most UTIs are caused by a bacterial infection, they usually can be treated with the use of antibiotics. The choice of antibiotic and length of treatment depend on your symptoms and the type of bacteria causing your infection. HOME CARE INSTRUCTIONS  If you were prescribed antibiotics, take them exactly as your caregiver instructs you. Finish the medication even if you feel better after you have only taken some of the medication.  Drink enough water and fluids to keep your urine clear or pale yellow.  Avoid caffeine, tea, and carbonated beverages. They tend to irritate your bladder.  Empty your bladder often. Avoid holding urine for long periods of time.  Empty your bladder before and after sexual intercourse.  After a bowel movement, women should cleanse from front to back. Use each tissue only once. SEEK MEDICAL CARE IF:   You have back pain.  You develop a fever.  Your symptoms do not begin to resolve within 3 days. SEEK IMMEDIATE MEDICAL CARE IF:   You have severe back pain or lower abdominal pain.  You develop chills.  You have nausea or vomiting.  You have continued burning or discomfort with urination. MAKE SURE YOU:   Understand these instructions.  Will watch your condition.  Will get help right away if you are not doing well or get worse.   This information is not intended to replace advice given to you by your health care provider. Make sure you discuss any questions you have with  your health care provider.   Document Released: 02/22/2005 Document Revised: 02/03/2015 Document Reviewed: 06/23/2011 Elsevier Interactive Patient Education [employer].

## 2015-06-30 NOTE — Progress Notes (Signed)
Subjective:    Patient ID: Valerie [name], female    DOB: [birth year], 65 y.o.   MRN: [id number]  HPI  Valerie [name] is a 65 year old female who presents today with a chief complaint of urinary frequency. She also reports low back pain, dysuria, and foul smelling odor to her urine. Her symptoms have been present for the past 2-3 weeks. Denies hematuria, vaginal discharge, fevers, chills. She's not taken anything OTC.  2) Rectal Pain: Has noticed external hemorrhoids for the past 2-3 weeks with tenderness and mild bright red bleeding. She can also feel some internal hemorrhoids in rectal canal. Denies heavy bleeding, weakness, fatigue, fevers. The hemorrhoids are mainly uncomfortable during work which requires her to move and lift objects daily.    Review of Systems  Gastrointestinal: Positive for anal bleeding and rectal pain.  Genitourinary: Positive for dysuria, frequency and flank pain. Negative for hematuria and difficulty urinating.       Past Medical History  Diagnosis Date  . Hypertension   . Acid reflux   . Hernia     Present to proximal to umbillical   . Menorrhagia   . Osteopenia   . Vitamin D deficiency   . Atrophic vaginitis   . Adenocarcinoma of the endometrium/uterus ([place]) 12/04/2000  . Vertigo   . PONV (postoperative nausea and vomiting)     Social History   Social History  . Marital Status: Married    Spouse Name: N/A  . Number of Children: N/A  . Years of Education: N/A   Occupational History  . Not on file.   Social History Main Topics  . Smoking status: Never Smoker   . Smokeless tobacco: Never Used  . Alcohol Use: No  . Drug Use: Not on file  . Sexual Activity: Not on file   Other Topics Concern  . 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       Past Surgical History  Procedure Laterality Date  . Tubal ligation    .  Cholecystectomy  1997  . Hysteroscopy  11/15/2000    D & C, resection of endometrial polyps  . Dilation and curettage of uterus      11/15/2000  . Abdominal hysterectomy  12/04/2000    also bilateral salpingo-oophorectomy  . Oophorectomy  12/04/2000    bilateral salpingo-oophorectomy done with TAH  . Carpal tunnel release  11/20/2011    Procedure: CARPAL TUNNEL RELEASE;  Surgeon: Elaina Hoops, MD;  Location: [facility];  Service: Neurosurgery;  Laterality: Left;  LEFT carpal tunnel release  . Cardiac catheterization  2006    Family History  Problem Relation Age of Onset  . Cancer Father     prostate cancer  . Hypertension Father   . Hypertension Sister   . Diabetes Brother   . Heart disease Brother     Deceased   . Hypertension Brother   . Diabetes Brother   . Heart disease Brother     Deceased    No Known Allergies  Current Outpatient Prescriptions on File Prior to Visit  Medication Sig Dispense Refill  . aspirin EC 81 MG tablet Take 81 mg by mouth daily.    Marland Kitchen atorvastatin (LIPITOR) 10 MG tablet Take 1 tablet (10 mg total) by mouth daily. 90 tablet 1  . Calcium Carbonate-Vit D-Min (  CALTRATE 600+D PLUS PO) Take 1 tablet by mouth 2 (two) times daily.      . fluticasone (FLONASE) 50 MCG/ACT nasal spray Place 2 sprays into the nose daily.      . hydrochlorothiazide (HYDRODIURIL) 25 MG tablet Take 1 tablet (25 mg total) by mouth daily. 90 tablet 0  . metoprolol succinate (TOPROL XL) 50 MG 24 hr tablet Take 1 tablet (50 mg total) by mouth daily. Take with or immediately following a meal. 90 tablet 1  . omeprazole (PRILOSEC) 40 MG capsule Take 1 capsule (40 mg total) by mouth daily. 90 capsule 1  . sucralfate (CARAFATE) 1 G tablet Take 1 tablet (1 g total) by mouth 4 (four) times daily -  with meals and at bedtime. 120 tablet 0  . tiZANidine (ZANAFLEX) 2 MG tablet Take 1 tablet (2 mg total) by mouth every 8 (eight) hours as needed for muscle spasms. 30 tablet 1  . valsartan  (DIOVAN) 320 MG tablet Take 320 mg by mouth daily.    . Vitamin D, Ergocalciferol, (DRISDOL) 50000 UNITS CAPS capsule Take 1 capsule (50,000 Units total) by mouth every 7 (seven) days. Take on Thursdays. 30 capsule 0   No current facility-administered medications on file prior to visit.    BP 118/74 mmHg  Pulse 76  Temp(Src) 97.6 F (36.4 C) (Oral)  Ht 5\' 6"  (1.676 m)  Wt 152 lb 12.8 oz (69.31 kg)  BMI 24.67 kg/m2  SpO2 98%    Objective:   Physical Exam  Constitutional: She appears well-nourished.  Cardiovascular: Normal rate and regular rhythm.   Pulmonary/Chest: Effort normal and breath sounds normal.  Abdominal:  Mild CVA tenderness to right. Small, external and internal hemorrhoids noted during exam. No bleeding, non thrombosed. Tender.  Skin: Skin is warm and dry.          Assessment & Plan:  Urinary Tract Infection:  Dysuria, frequency, pelvic pressure x 2-3 weeks. No improvement. Mild CVA tenderness to right flank, overall exam unremarkable.  UA: 2+ leuks and positive for blood. Culture sent. Due to symptoms and presence of bacteria, will treat. RX for bactrim course and pyridium sent to pharmacy. Fluids, rest, return precautions provided.

## 2015-06-30 NOTE — Progress Notes (Signed)
Pre visit review using our clinic review tool, if applicable. No additional management support is needed unless otherwise documented below in the visit note. 

## 2015-07-07 LAB — URINE CULTURE: Colony Count: >=100000

## 2015-07-19 ENCOUNTER — Other Ambulatory Visit: Payer: Self-pay

## 2015-07-19 DIAGNOSIS — I10 Essential (primary) hypertension: Secondary | ICD-10-CM

## 2015-07-19 MED ORDER — VALSARTAN 320 MG PO TABS: 320.0000 mg | ORAL_TABLET | Freq: Every day | ORAL | Status: DC

## 2015-07-19 NOTE — Telephone Encounter (Signed)
Pt request refill valsartan 320 mg to [facility] [address]. Do not see where Allie Bossier NP has filled before. Please advise.

## 2015-07-27 LAB — HM DIABETES EYE EXAM

## 2015-08-10 ENCOUNTER — Ambulatory Visit (INDEPENDENT_AMBULATORY_CARE_PROVIDER_SITE_OTHER): Payer: BLUE CROSS/BLUE SHIELD | Admitting: Primary Care

## 2015-08-10 ENCOUNTER — Encounter: Payer: Self-pay | Admitting: Primary Care

## 2015-08-10 VITALS — BP 110/76 | HR 74 | Temp 97.4°F | Ht 66.0 in | Wt 154.4 lb

## 2015-08-10 DIAGNOSIS — K649 Unspecified hemorrhoids: Secondary | ICD-10-CM | POA: Diagnosis not present

## 2015-08-10 LAB — IFOBT (OCCULT BLOOD): IFOBT: NEGATIVE

## 2015-08-10 MED ORDER — HYDROCORTISONE ACETATE 25 MG RE SUPP: 25.0000 mg | Freq: Two times a day (BID) | RECTAL | Status: DC

## 2015-08-10 NOTE — Progress Notes (Signed)
Subjective:    Patient ID: Valerie [name], female    DOB: 1951/05/09, 65 y.o.   MRN: [id number]  HPI  Valerie [name] is a 65 year old female who presents today with a chief complaint of hemorrhoids. She was evaluated in early February 2017 for complaints of hemorrhoids. She was noted to have external and internal hemorrhoids and was proivded with a prescription for Anusol HC cream and experienced little improvement.  Since her last visit she continues to experience bright red rectal bleeding intermittently with bowel movements. She can feel her internal hemorrhoids protrude with some bowel movements which causes a great deal of pain. Denies weakness, large amounts of bleeding, fevers.   Review of Systems  Constitutional: Negative for fever and fatigue.  Gastrointestinal: Positive for anal bleeding. Negative for nausea, abdominal pain and constipation.  Neurological: Negative for weakness.       Past Medical History  Diagnosis Date  . Hypertension   . Acid reflux   . Hernia     Present to proximal to umbillical   . Menorrhagia   . Osteopenia   . Vitamin D deficiency   . Atrophic vaginitis   . Adenocarcinoma of the endometrium/uterus ([place]) 12/04/2000  . Vertigo   . PONV (postoperative nausea and vomiting)     Social History   Social History  . Marital Status: Married    Spouse Name: N/A  . Number of Children: N/A  . Years of Education: N/A   Occupational History  . Not on file.   Social History Main Topics  . Smoking status: Never Smoker   . Smokeless tobacco: Never Used  . Alcohol Use: No  . Drug Use: Not on file  . Sexual Activity: Not on file   Other Topics Concern  . 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       Past Surgical History  Procedure Laterality Date  . Tubal ligation    . Cholecystectomy  1997  . Hysteroscopy  11/15/2000    D &  C, resection of endometrial polyps  . Dilation and curettage of uterus      11/15/2000  . Abdominal hysterectomy  12/04/2000    also bilateral salpingo-oophorectomy  . Oophorectomy  12/04/2000    bilateral salpingo-oophorectomy done with TAH  . Carpal tunnel release  11/20/2011    Procedure: CARPAL TUNNEL RELEASE;  Surgeon: Elaina Hoops, MD;  Location: [facility];  Service: Neurosurgery;  Laterality: Left;  LEFT carpal tunnel release  . Cardiac catheterization  2006    Family History  Problem Relation Age of Onset  . Cancer Father     prostate cancer  . Hypertension Father   . Hypertension Sister   . Diabetes Brother   . Heart disease Brother     Deceased   . Hypertension Brother   . Diabetes Brother   . Heart disease Brother     Deceased    No Known Allergies  Current Outpatient Prescriptions on File Prior to Visit  Medication Sig Dispense Refill  . aspirin EC 81 MG tablet Take 81 mg by mouth daily.    Marland Kitchen atorvastatin (LIPITOR) 10 MG tablet Take 1 tablet (10 mg total) by mouth daily. 90 tablet 1  . Calcium Carbonate-Vit D-Min (CALTRATE 600+D PLUS PO) Take 1 tablet by mouth 2 (two) times  daily.      . fluticasone (FLONASE) 50 MCG/ACT nasal spray Place 2 sprays into the nose daily.      . hydrochlorothiazide (HYDRODIURIL) 25 MG tablet Take 1 tablet (25 mg total) by mouth daily. 90 tablet 0  . hydrocortisone (ANUSOL-HC) 2.5 % rectal cream Place 1 application rectally 2 (two) times daily. 30 g 0  . metoprolol succinate (TOPROL XL) 50 MG 24 hr tablet Take 1 tablet (50 mg total) by mouth daily. Take with or immediately following a meal. 90 tablet 1  . omeprazole (PRILOSEC) 40 MG capsule Take 1 capsule (40 mg total) by mouth daily. 90 capsule 1  . phenazopyridine (PYRIDIUM) 200 MG tablet Take 1 tablet (200 mg total) by mouth 3 (three) times daily as needed for pain. 10 tablet 0  . sucralfate (CARAFATE) 1 G tablet Take 1 tablet (1 g total) by mouth 4 (four) times daily -  with meals  and at bedtime. 120 tablet 0  . sulfamethoxazole-trimethoprim (BACTRIM DS,SEPTRA DS) 800-160 MG tablet Take 1 tablet by mouth 2 (two) times daily. 10 tablet 0  . tiZANidine (ZANAFLEX) 2 MG tablet Take 1 tablet (2 mg total) by mouth every 8 (eight) hours as needed for muscle spasms. 30 tablet 1  . valsartan (DIOVAN) 320 MG tablet Take 1 tablet (320 mg total) by mouth daily. 90 tablet 1  . Vitamin D, Ergocalciferol, (DRISDOL) 50000 UNITS CAPS capsule Take 1 capsule (50,000 Units total) by mouth every 7 (seven) days. Take on Thursdays. 30 capsule 0   No current facility-administered medications on file prior to visit.    BP 110/76 mmHg  Pulse 74  Temp(Src) 97.4 F (36.3 C) (Oral)  Ht 5\' 6"  (1.676 m)  Wt 154 lb 6.4 oz (70.035 kg)  BMI 24.93 kg/m2  SpO2 98%    Objective:   Physical Exam  Constitutional: She appears well-nourished.  Cardiovascular: Normal rate and regular rhythm.   Pulmonary/Chest: Effort normal and breath sounds normal.  Genitourinary: Rectal exam shows external hemorrhoid, internal hemorrhoid and tenderness. Rectal exam shows anal tone normal. Guaiac negative stool.  Skin: Skin is warm and dry.          Assessment & Plan:

## 2015-08-10 NOTE — Progress Notes (Signed)
Pre visit review using our clinic review tool, if applicable. No additional management support is needed unless otherwise documented below in the visit note. 

## 2015-08-10 NOTE — Assessment & Plan Note (Addendum)
Continues. Little improvement with anusol HC cream. Exam with mild, non thrombosed external hemorrhoids, evidence of internal hemorrhoids upon exam. Very tender. Negative hemoccult stool card. No obvious rectal bleeding.  Will try Anusol HC suppositories as her symptoms seem to be related to internal hemorrhoids.  If no improvement, will consider referral to GI for evaluation.

## 2015-08-10 NOTE — Patient Instructions (Addendum)
Try Anusol HC Suppositories. Place 1 suppository twice daily for internal hemorrhoids.   Please call me if no improvement. We may need to consider sending you to GI for further evaluation.  It was a pleasure to see you today!  Hemorrhoids Hemorrhoids are swollen veins around the rectum or anus. There are two types of hemorrhoids:   Internal hemorrhoids. These occur in the veins just inside the rectum. They may poke through to the outside and become irritated and painful.  External hemorrhoids. These occur in the veins outside the anus and can be felt as a painful swelling or hard lump near the anus. CAUSES  Pregnancy.   Obesity.   Constipation or diarrhea.   Straining to have a bowel movement.   Sitting for long periods on the toilet.  Heavy lifting or other activity that caused you to strain.  Anal intercourse. SYMPTOMS   Pain.   Anal itching or irritation.   Rectal bleeding.   Fecal leakage.   Anal swelling.   One or more lumps around the anus.  DIAGNOSIS  Your caregiver may be able to diagnose hemorrhoids by visual examination. Other examinations or tests that may be performed include:   Examination of the rectal area with a gloved hand (digital rectal exam).   Examination of anal canal using a small tube (scope).   A blood test if you have lost a significant amount of blood.  A test to look inside the colon (sigmoidoscopy or colonoscopy). TREATMENT Most hemorrhoids can be treated at home. However, if symptoms do not seem to be getting better or if you have a lot of rectal bleeding, your caregiver may perform a procedure to help make the hemorrhoids get smaller or remove them completely. Possible treatments include:   Placing a rubber band at the base of the hemorrhoid to cut off the circulation (rubber band ligation).   Injecting a chemical to shrink the hemorrhoid (sclerotherapy).   Using a tool to burn the hemorrhoid (infrared light  therapy).   Surgically removing the hemorrhoid (hemorrhoidectomy).   Stapling the hemorrhoid to block blood flow to the tissue (hemorrhoid stapling).  HOME CARE INSTRUCTIONS   Eat foods with fiber, such as whole grains, beans, nuts, fruits, and vegetables. Ask your doctor about taking products with added fiber in them (fibersupplements).  Increase fluid intake. Drink enough water and fluids to keep your urine clear or pale yellow.   Exercise regularly.   Go to the bathroom when you have the urge to have a bowel movement. Do not wait.   Avoid straining to have bowel movements.   Keep the anal area dry and clean. Use wet toilet paper or moist towelettes after a bowel movement.   Medicated creams and suppositories may be used or applied as directed.   Only take over-the-counter or prescription medicines as directed by your caregiver.   Take warm sitz baths for 15-20 minutes, 3-4 times a day to ease pain and discomfort.   Place ice packs on the hemorrhoids if they are tender and swollen. Using ice packs between sitz baths may be helpful.   Put ice in a plastic bag.   Place a towel between your skin and the bag.   Leave the ice on for 15-20 minutes, 3-4 times a day.   Do not use a donut-shaped pillow or sit on the toilet for long periods. This increases blood pooling and pain.  SEEK MEDICAL CARE IF:  You have increasing pain and swelling that is not  controlled by treatment or medicine.  You have uncontrolled bleeding.  You have difficulty or you are unable to have a bowel movement.  You have pain or inflammation outside the area of the hemorrhoids. MAKE SURE YOU:  Understand these instructions.  Will watch your condition.  Will get help right away if you are not doing well or get worse.   This information is not intended to replace advice given to you by your health care provider. Make sure you discuss any questions you have with your health care  provider.   Document Released: 05/12/2000 Document Revised: 05/01/2012 Document Reviewed: 03/19/2012 Elsevier Interactive Patient Education [employer].

## 2015-08-13 ENCOUNTER — Ambulatory Visit: Payer: BLUE CROSS/BLUE SHIELD | Admitting: Primary Care

## 2015-08-16 ENCOUNTER — Other Ambulatory Visit: Payer: Self-pay

## 2015-08-17 ENCOUNTER — Telehealth: Payer: Self-pay | Admitting: Primary Care

## 2015-08-17 DIAGNOSIS — K649 Unspecified hemorrhoids: Secondary | ICD-10-CM

## 2015-08-17 NOTE — Telephone Encounter (Signed)
I would like to send her to GI for further evaluation as discussed. If she's agreeable then I will place the referral. Please notify me of her decision. I highly recommend this.

## 2015-08-17 NOTE — Telephone Encounter (Signed)
Will you please check on Ms. Valerie [name]? How are her hemorrhoids? Any improvement?

## 2015-08-17 NOTE — Telephone Encounter (Signed)
Called patient and she stated that the hemorrhoids are not that much better, just a little bit.

## 2015-08-18 NOTE — Telephone Encounter (Signed)
Noted  Referral placed.

## 2015-08-18 NOTE — Telephone Encounter (Signed)
Pt returned your call - please call [phone] Thank you

## 2015-08-18 NOTE — Telephone Encounter (Signed)
Spoke with patient and she is agreeable to referral. She has no preference.

## 2015-08-18 NOTE — Telephone Encounter (Signed)
Per DPR, left detail message of Kate's comments and for patient to call back.

## 2015-08-19 ENCOUNTER — Encounter: Payer: Self-pay | Admitting: Gastroenterology

## 2015-08-24 ENCOUNTER — Encounter: Payer: Self-pay | Admitting: Gastroenterology

## 2015-08-24 ENCOUNTER — Ambulatory Visit (INDEPENDENT_AMBULATORY_CARE_PROVIDER_SITE_OTHER): Payer: BLUE CROSS/BLUE SHIELD | Admitting: Gastroenterology

## 2015-08-24 VITALS — BP 104/60 | HR 68 | Ht 66.0 in | Wt 154.0 lb

## 2015-08-24 DIAGNOSIS — K602 Anal fissure, unspecified: Secondary | ICD-10-CM

## 2015-08-24 DIAGNOSIS — R1310 Dysphagia, unspecified: Secondary | ICD-10-CM | POA: Diagnosis not present

## 2015-08-24 DIAGNOSIS — K649 Unspecified hemorrhoids: Secondary | ICD-10-CM | POA: Diagnosis not present

## 2015-08-24 DIAGNOSIS — K219 Gastro-esophageal reflux disease without esophagitis: Secondary | ICD-10-CM | POA: Diagnosis not present

## 2015-08-24 DIAGNOSIS — Z86010 Personal history of colonic polyps: Secondary | ICD-10-CM

## 2015-08-24 MED ORDER — RANITIDINE HCL 150 MG PO TABS: 150.0000 mg | ORAL_TABLET | Freq: Two times a day (BID) | ORAL | Status: DC

## 2015-08-24 MED ORDER — AMBULATORY NON FORMULARY MEDICATION: Status: DC

## 2015-08-24 NOTE — Progress Notes (Signed)
HPI :  65 y/o female with history of HTN seen in consultation for GERD and rectal bleeding  She has had longstanding acid reflux. She reports it bothers her fairly frequently. She has pyrosis / regurgitation, and belching. She takes omeprazole 40mg  daily which has been her regimen for the past 6 months. She has frequent breakthrough despite taking this dosing, daily breakthrough. She has symptoms both during the day and also at night. She has been on protonix, nexium, and prilosec in the past, and Dexilant - she thinks omeprazole 40mg  has worked the best of all of them. If she does not take it at all she will feel even worse. She has dysphagia with solids and with pills. No liquid dysphagia. She will feel it in the sternal notch. She needs to drink water to push things through. She will have this rarely, not routinely. This dysphagia is new and has been bothering her for 3-4 months. No weight loss unexpectedly, she has lost 30 lbs but trying to do so. She is taking carafate once or twice per day, doesn't help too much. She takes tablet form. Prior EGD done in 2005 showing esophagitis and was dilated to 29mm Savory. She has a history of osteopenia.  She also has endorsed some blood in the stools for 3-4 months. She will see this usually if hemorrhoids are prolapsed. She lifts heavy weight for her work which can prolapse her hemorrhoids, at least this is what she thinks it is. She was told she had both internal and external hemorrhoids. Hemorrhoid does not spontaneously reduce, they have to be manually reduced. She has one BM per day or so. She has occasional constipation, occasional loose stool, she thinks looser stool is more common for her. She has occasional lower abdominal cramping. Prior to 3-4 months she had not seen any blood in the stools. She has some perianal pain associated with this as well. She has had a colonoscopy done in [place] a few years ago and thinks she had a polyp removed but is not  sure.   Prior endoscopic evaluation: EGD 2005 - LA grade A esophagitis, empiric dilation to 85mm Savory Colonoscopy done in [place] a few years ago, she thinks she may have had a polyp removed   Past Medical History  Diagnosis Date  . Hypertension   . Acid reflux   . Hernia     Present to proximal to umbillical   . Menorrhagia   . Osteopenia   . Vitamin D deficiency   . Atrophic vaginitis   . Adenocarcinoma of the endometrium/uterus ([place]) 12/04/2000  . Vertigo   . PONV (postoperative nausea and vomiting)      Past Surgical History  Procedure Laterality Date  . Tubal ligation    . Cholecystectomy  1997  . Hysteroscopy  11/15/2000    D & C, resection of endometrial polyps  . Dilation and curettage of uterus      11/15/2000  . Abdominal hysterectomy  12/04/2000    also bilateral salpingo-oophorectomy  . Oophorectomy  12/04/2000    bilateral salpingo-oophorectomy done with TAH  . Carpal tunnel release  11/20/2011    Procedure: CARPAL TUNNEL RELEASE;  Surgeon: Elaina Hoops, MD;  Location: [facility];  Service: Neurosurgery;  Laterality: Left;  LEFT carpal tunnel release  . Cardiac catheterization  2006   Family History  Problem Relation Age of Onset  . Cancer Father     prostate cancer  . Hypertension Father   .  Hypertension Sister   . Diabetes Brother   . Heart disease Brother     Deceased   . Hypertension Brother   . Diabetes Brother   . Heart disease Brother     Deceased   Social History  Substance Use Topics  . Smoking status: Never Smoker   . Smokeless tobacco: Never Used  . Alcohol Use: No   Current Outpatient Prescriptions  Medication Sig Dispense Refill  . aspirin EC 81 MG tablet Take 81 mg by mouth daily.    Marland Kitchen atorvastatin (LIPITOR) 10 MG tablet Take 1 tablet (10 mg total) by mouth daily. 90 tablet 1  . Calcium Carbonate-Vit D-Min (CALTRATE 600+D PLUS PO) Take 1 tablet by mouth 2 (two) times daily.      . fluticasone (FLONASE) 50 MCG/ACT nasal  spray Place 2 sprays into the nose daily.      . hydrochlorothiazide (HYDRODIURIL) 25 MG tablet Take 1 tablet (25 mg total) by mouth daily. 90 tablet 0  . hydrocortisone (ANUSOL-HC) 2.5 % rectal cream Place 1 application rectally 2 (two) times daily. 30 g 0  . hydrocortisone (ANUSOL-HC) 25 MG suppository Place 1 suppository (25 mg total) rectally 2 (two) times daily. 12 suppository 1  . metoprolol succinate (TOPROL XL) 50 MG 24 hr tablet Take 1 tablet (50 mg total) by mouth daily. Take with or immediately following a meal. 90 tablet 1  . omeprazole (PRILOSEC) 40 MG capsule Take 1 capsule (40 mg total) by mouth daily. 90 capsule 1  . phenazopyridine (PYRIDIUM) 200 MG tablet Take 1 tablet (200 mg total) by mouth 3 (three) times daily as needed for pain. 10 tablet 0  . sucralfate (CARAFATE) 1 G tablet Take 1 tablet (1 g total) by mouth 4 (four) times daily -  with meals and at bedtime. 120 tablet 0  . tiZANidine (ZANAFLEX) 2 MG tablet Take 1 tablet (2 mg total) by mouth every 8 (eight) hours as needed for muscle spasms. 30 tablet 1  . valsartan (DIOVAN) 320 MG tablet Take 1 tablet (320 mg total) by mouth daily. 90 tablet 1  . Vitamin D, Ergocalciferol, (DRISDOL) 50000 UNITS CAPS capsule Take 1 capsule (50,000 Units total) by mouth every 7 (seven) days. Take on Thursdays. 30 capsule 0   No current facility-administered medications for this visit.   No Known Allergies   Review of Systems: All systems reviewed and negative except where noted in HPI.   Lab Results  Component Value Date   WBC 6.6 12/31/2014   HGB 12.6 12/31/2014   HCT 37.4 12/31/2014   MCV 93.7 12/31/2014   PLT 239.0 12/31/2014    Lab Results  Component Value Date   CREATININE 0.94 12/31/2014   BUN 22 12/31/2014   NA 143 12/31/2014   K 3.9 12/31/2014   CL 107 12/31/2014   CO2 29 12/31/2014    Lab Results  Component Value Date   ALT 21 12/31/2014   AST 25 12/31/2014   ALKPHOS 53 12/31/2014   BILITOT 0.6 12/31/2014       Physical Exam: BP 104/60 mmHg  Pulse 68  Ht 5\' 6"  (1.676 m)  Wt 154 lb (69.854 kg)  BMI 24.87 kg/m2 Constitutional: Pleasant,well-developed, female in no acute distress. HEENT: Normocephalic and atraumatic. Conjunctivae are normal. No scleral icterus. Neck supple.  Cardiovascular: Normal rate, regular rhythm.  Pulmonary/chest: Effort normal and breath sounds normal. No wheezing, rales or rhonchi. Abdominal: Soft, nondistended, nontender. Bowel sounds active throughout. There are no masses palpable. No  hepatomegaly. Rectal exam / Anoscopy - anal fissure posterior midline anal canal, internal hemorrhoids noted, most prominent in LL position Extremities: no edema Lymphadenopathy: No cervical adenopathy noted. Neurological: Alert and oriented to person place and time. Skin: Skin is warm and dry. No rashes noted. Psychiatric: Normal mood and affect. Behavior is normal.   ASSESSMENT AND PLAN: 65 y/o female with PMH as above seen in consultation to address the following issues:  GERD / dysphagia - longstanding reflux on multiple PPIs. I discussed the long term side effects of PPIs with her. The risks of long term PPIs with current data include increased risk for chronic kidney disease, increased risk of fracture, increased risk of C diff, increased risk of pneumonia (short term usage), potentially increased risk of B12 / calcium deficiency, and rare risk of hypomagnesemia. Recent studies have also shown an association with increased risk of dementia and cardiovascular outcomes including stroke. These studies have showed an association between PPIs and several of these outcomes but no evidence of causality. The patient has a history of osteopenia, and while she remains symptomatic on PPI, I would prefer to minimize its dose if possible. In this light, will add zantac 150mg  BID to her regimen, and recommend she try using Gaviscon PRN as well. Hopefully this will provide additional relief. Given  her dysphagia I otherwise offered her an EGD to evaluate this and dilate as needed. The indications, risks, and benefits of EGD were explained to the patient in detail. Risks include but are not limited to bleeding, perforation, adverse reaction to medications, and cardiopulmonary compromise. Sequelae include but are not limited to the possibility of surgery, hospitalization, and mortality. The patient verbalized understanding and wished to proceed. All questions answered, referred to scheduler. Further recommendations pending results of the exam.   Anal fissure - noted on anoscopy and a likely cause of her bleeding and anal pain. Recommend topical nitroglycerin ointment 0.125% up to TID to see if this helps  Internal hemorrhoids - noted on anoscopy, intermittent prolapse. Recommend daily fiber supplement for now and will treat her fissure first. Once fissure is treated we may consider a banding if symptoms persist. She agreed.   History of colon polyps - reported per patient. We have asked to obtain prior records to determine when her last exam was and if she warrants further surveillance at this time. I will await records and get back to her with further recommendations regarding this. She agreed.   [place] Cellar, MD [facility] Pager [phone]  CC: Pleas Koch, NP

## 2015-08-24 NOTE — Patient Instructions (Addendum)
We have sent in Zantac to your pharmacy [facility]) Use Gaviscon daily over the counter Use Fiber Supplements daily  We will send in Nitroglycerin ointment to [facility] (directions given)  You have been scheduled for an endoscopy. Please follow written instructions given to you at your visit today. If you use inhalers (even only as needed), please bring them with you on the day of your procedure. Your physician has requested that you go to www.[email or web address].[email or web address] and enter the access code given to you at your visit today. This web site gives a general overview about your procedure. However, you should still follow specific instructions given to you by our office regarding your preparation for the procedure.

## 2015-08-30 ENCOUNTER — Ambulatory Visit (AMBULATORY_SURGERY_CENTER): Payer: BLUE CROSS/BLUE SHIELD | Admitting: Gastroenterology

## 2015-08-30 ENCOUNTER — Encounter: Payer: Self-pay | Admitting: Gastroenterology

## 2015-08-30 VITALS — BP 111/69 | HR 60 | Temp 98.4°F | Resp 16 | Ht 66.0 in | Wt 154.0 lb

## 2015-08-30 DIAGNOSIS — K219 Gastro-esophageal reflux disease without esophagitis: Secondary | ICD-10-CM | POA: Diagnosis present

## 2015-08-30 MED ORDER — SODIUM CHLORIDE 0.9 % IV SOLN: 500.0000 mL | INTRAVENOUS | Status: DC

## 2015-08-30 NOTE — Op Note (Signed)
[facility] Patient Name: Valerie [name] Procedure Date: 08/30/2015 4:23 PM MRN: [id number] Endoscopist: Remo Lipps P. Havery Moros , MD Age: 65 Referring MD:  Date of Birth: 02-16-1951 Gender: Female Procedure:                Upper GI endoscopy Indications:              Dysphagia, Heartburn Medicines:                Monitored Anesthesia Care Procedure:                Pre-Anesthesia Assessment:                           - Prior to the procedure, a History and Physical                            was performed, and patient medications and                            allergies were reviewed. The patient's tolerance of                            previous anesthesia was also reviewed. The risks                            and benefits of the procedure and the sedation                            options and risks were discussed with the patient.                            All questions were answered, and informed consent                            was obtained. Prior Anticoagulants: The patient has                            taken aspirin, last dose was 1 day prior to                            procedure. ASA Grade Assessment: II - A patient                            with mild systemic disease. After reviewing the                            risks and benefits, the patient was deemed in                            satisfactory condition to undergo the procedure.                           After obtaining informed consent, the endoscope was  passed under direct vision. Throughout the                            procedure, the patient's blood pressure, pulse, and                            oxygen saturations were monitored continuously. The                            Model GIF-HQ190 [id number]) scope was introduced                            through the mouth, and advanced to the second part                            of duodenum. The upper GI endoscopy was               accomplished without difficulty. The patient                            tolerated the procedure well. Scope In: Scope Out: Findings:      Esophagogastric landmarks were identified: the Z-line was found at 36       cm, the gastroesophageal junction was found at 36 cm and the site of       hiatal narrowing was found at 36 cm from the incisors.      LA Grade A (one or more mucosal breaks less than 5 mm, not extending       between tops of 2 mucosal folds) esophagitis with no bleeding was found.      No other significant abnormalities were identified in a careful       examination of the esophagus. No stenosis or stricture was noted to       account for dysphagia. No changes concerning for EoE were noted.      The entire examined stomach was normal.      The duodenal bulb and second portion of the duodenum were normal. Complications:            No immediate complications. Estimated blood loss:                            None. Estimated Blood Loss:     Estimated blood loss: none. Impression:               - Esophagogastric landmarks identified.                           - LA Grade A reflux esophagitis.                           - Otherwise normal esophagus without pathology to                            have caused dysphagia                           - Normal stomach.                           -  Normal duodenal bulb and second portion of the                            duodenum.                           - No specimens collected. Recommendation:           - Patient has a contact number available for                            emergencies. The signs and symptoms of potential                            delayed complications were discussed with the                            patient. Return to normal activities tomorrow.                            Written discharge instructions were provided to the                            patient.                           - Resume previous  diet.                           - Continue present medications.                           - Continue omeprazole 40mg  daily, with recent                            addition of zantac 150mg  BID (in light of                            osteopenia), and Gaviscon PRN                           - Recommend esophageal manometry with 24 HR pH                            testing to further evaluate dysphagia and reflux if                            symptoms persist Procedure Code(s):        --- Professional ---                           [id number], Esophagogastroduodenoscopy, flexible,                            transoral; diagnostic, including collection of  specimen(s) by brushing or washing, when performed                            (separate procedure) CPT copyright 2016 [employer]. All rights reserved. Remo Lipps P. Armbruster, MD 08/30/2015 4:43:43 PM This report has been signed electronically. Number of Addenda: 0 Referring MD:      Pleas Koch

## 2015-08-30 NOTE — Patient Instructions (Signed)
Impressions/recommendations:  Esophagitis (handout given)  YOU HAD AN ENDOSCOPIC PROCEDURE TODAY AT [facility]:   Refer to the procedure report that was given to you for any specific questions about what was found during the examination.  If the procedure report does not answer your questions, please call your gastroenterologist to clarify.  If you requested that your care partner not be given the details of your procedure findings, then the procedure report has been included in a sealed envelope for you to review at your convenience later.  YOU SHOULD EXPECT: Some feelings of bloating in the abdomen. Passage of more gas than usual.  Walking can help get rid of the air that was put into your GI tract during the procedure and reduce the bloating. If you had a lower endoscopy (such as a colonoscopy or flexible sigmoidoscopy) you may notice spotting of blood in your stool or on the toilet paper. If you underwent a bowel prep for your procedure, you may not have a normal bowel movement for a few days.  Please Note:  You might notice some irritation and congestion in your nose or some drainage.  This is from the oxygen used during your procedure.  There is no need for concern and it should clear up in a day or so.  SYMPTOMS TO REPORT IMMEDIATELY:   Following upper endoscopy (EGD)  Vomiting of blood or coffee ground material  New chest pain or pain under the shoulder blades  Painful or persistently difficult swallowing  New shortness of breath  Fever of 100F or higher  Black, tarry-looking stools  For urgent or emergent issues, a gastroenterologist can be reached at any hour by calling [phone].   DIET: Your first meal following the procedure should be a small meal and then it is ok to progress to your normal diet. Heavy or fried foods are harder to digest and may make you feel nauseous or bloated.  Likewise, meals heavy in dairy and vegetables can increase bloating.  Drink  plenty of fluids but you should avoid alcoholic beverages for 24 hours.  ACTIVITY:  You should plan to take it easy for the rest of today and you should NOT DRIVE or use heavy machinery until tomorrow (because of the sedation medicines used during the test).    FOLLOW UP: Our staff will call the number listed on your records the next business day following your procedure to check on you and address any questions or concerns that you may have regarding the information given to you following your procedure. If we do not reach you, we will leave a message.  However, if you are feeling well and you are not experiencing any problems, there is no need to return our call.  We will assume that you have returned to your regular daily activities without incident.  If any biopsies were taken you will be contacted by phone or by letter within the next 1-3 weeks.  Please call us at [phone] if you have not heard about the biopsies in 3 weeks.    SIGNATURES/CONFIDENTIALITY: You and/or your care partner have signed paperwork which will be entered into your electronic medical record.  These signatures attest to the fact that that the information above on your After Visit Summary has been reviewed and is understood.  Full responsibility of the confidentiality of this discharge information lies with you and/or your care-partner.

## 2015-08-30 NOTE — Progress Notes (Signed)
Report to PACU, RN, vss, BBS= Clear.  

## 2015-08-31 ENCOUNTER — Other Ambulatory Visit: Payer: Self-pay | Admitting: *Deleted

## 2015-08-31 ENCOUNTER — Telehealth: Payer: Self-pay | Admitting: *Deleted

## 2015-08-31 ENCOUNTER — Telehealth: Payer: Self-pay

## 2015-08-31 ENCOUNTER — Encounter: Payer: Self-pay | Admitting: *Deleted

## 2015-08-31 NOTE — Telephone Encounter (Signed)
Jaydah Cutler MRN [id number] to schedule manometry and 24 HR pH testing? Scheduled patient at [facility] endo on 09/20/15 at 8:30 AM.(Jill)

## 2015-08-31 NOTE — Telephone Encounter (Signed)
Left a message for patient to call back. 

## 2015-08-31 NOTE — Telephone Encounter (Signed)
  Follow up Call-  Call back number 08/30/2015  Post procedure Call Back phone  # [phone]  Permission to leave phone message Yes    Patient was called for follow up after her procedure on 08/30/2015. No answer at the number given for follow up phone call. A message was left on the answering machine.

## 2015-09-01 NOTE — Telephone Encounter (Signed)
Left a message for patient to call back. 

## 2015-09-01 NOTE — Telephone Encounter (Signed)
Patient given appointment date, time and instructions. Mailed instructions also.

## 2015-09-02 ENCOUNTER — Telehealth: Payer: Self-pay | Admitting: Gastroenterology

## 2015-09-02 NOTE — Telephone Encounter (Signed)
Rollene Fare we have reached out to find this patient's old colonoscopy records but her physicians office has no records of a colonoscopy. Can you ask the patient to clarify where this was done so we can try to obtain these and clarify when she is next due? thanks

## 2015-09-03 NOTE — Telephone Encounter (Signed)
Spoke with patient and it was [facility]. Fa

## 2015-09-03 NOTE — Telephone Encounter (Signed)
Spoke with patient and she had the procedure at Dr. Park Pope office. Faxed record request to correct office.

## 2015-09-03 NOTE — Telephone Encounter (Signed)
Patient not available. Will try again later.

## 2015-09-03 NOTE — Telephone Encounter (Signed)
Left a message for patient to call back. 

## 2015-09-13 NOTE — Telephone Encounter (Signed)
tcon opened in error

## 2015-09-16 ENCOUNTER — Telehealth: Payer: Self-pay | Admitting: Gastroenterology

## 2015-09-16 NOTE — Telephone Encounter (Signed)
Spoke with patient and scheduled OV on 10/20/15 with Dr. Havery Moros.

## 2015-09-16 NOTE — Telephone Encounter (Signed)
Colonoscopy records for this patient obtained, was performed at [facility] by Dr. Minna Merritts - she endorsed chronic diarrhea at the time and had heme positive stool as the indication. There were internal hemorrhoids noted and no colon polyps. However, while the colon appeared normal, given her diarrhea, random biopsies were obtained which showed mildly active colitis with evidence for chronicity.  - ddx included NSAID colitis vs. Early IBD vs. Infectious. No mentioned of microscopic colitis noted.  While she had no colon polyps on this exam, the findings of chronic colitis is concerning. She denied any diarrhea when I saw her in clinic, however once done with her manometry which is scheduled, I would like to see her back in clinic to discuss this further. She does not warrant a colonoscopy for colon cancer screening given she had no polyps, but unclear the significance of her other findings, perhaps a self limited process or related to NSAIDS if she took those at the time.   Can you please let her know? Thanks

## 2015-09-20 ENCOUNTER — Encounter (HOSPITAL_COMMUNITY)
Admission: RE | Disposition: A | Discharge status: Home / Self Care | Payer: Self-pay | Source: Ambulatory Visit | Attending: Gastroenterology

## 2015-09-20 ENCOUNTER — Ambulatory Visit (HOSPITAL_COMMUNITY)
Admission: RE | Admit: 2015-09-20 | Discharge: 2015-09-20 | Disposition: A | Discharge status: Home / Self Care | Payer: BLUE CROSS/BLUE SHIELD | Source: Ambulatory Visit | Attending: Gastroenterology | Admitting: Gastroenterology

## 2015-09-20 DIAGNOSIS — K219 Gastro-esophageal reflux disease without esophagitis: Secondary | ICD-10-CM | POA: Insufficient documentation

## 2015-09-20 HISTORY — PX: ESOPHAGEAL MANOMETRY: SHX5429

## 2015-09-20 SURGERY — MANOMETRY, ESOPHAGUS

## 2015-09-20 MED ORDER — LIDOCAINE VISCOUS 2 % MT SOLN
OROMUCOSAL | Status: AC
  Filled 2015-09-20: qty 15

## 2015-09-20 SURGICAL SUPPLY — 2 items
FACESHIELD LNG OPTICON STERILE (SAFETY) IMPLANT
GLOVE BIO SURGEON STRL SZ8 (GLOVE) ×4 IMPLANT

## 2015-09-21 ENCOUNTER — Other Ambulatory Visit: Payer: Self-pay | Admitting: Family Medicine

## 2015-09-21 ENCOUNTER — Other Ambulatory Visit: Payer: Self-pay | Admitting: Primary Care

## 2015-09-21 ENCOUNTER — Encounter (HOSPITAL_COMMUNITY): Payer: Self-pay | Admitting: Gastroenterology

## 2015-09-21 DIAGNOSIS — I10 Essential (primary) hypertension: Secondary | ICD-10-CM

## 2015-09-21 NOTE — Telephone Encounter (Signed)
Electronically refill request for   hydrochlorothiazide (HYDRODIURIL) 25 MG tablet   Take 1 tablet (25 mg total) by mouth daily.  Dispense: 90 tablet   Refills: 0     Last prescribed by DR Diona Browner on 05/28/2015. Last seen on 08/10/2015. No future appt.

## 2015-09-21 NOTE — Progress Notes (Signed)
Esophageal manometry done per protocol.  Patient tolerated well.  The proximal LES was marked at 36.4 cm.  Unable to place pH probe at this time due to conflict with patients work requirements.  Patient educated on pH study and what it involves.  Patient will be scheduled another day to have 24 hour pH probe placed.  Manometry report sent to Dr. Weiner Cellar.

## 2015-09-23 ENCOUNTER — Telehealth: Payer: Self-pay | Admitting: Gastroenterology

## 2015-09-23 NOTE — Telephone Encounter (Signed)
Spoke with patient and she cannot do a Monday. Paulino Door and scheduled patient on 10/06/15 at 8:30 AM. Patient given date and time.

## 2015-10-01 ENCOUNTER — Telehealth: Payer: Self-pay | Admitting: Gastroenterology

## 2015-10-04 ENCOUNTER — Telehealth: Payer: Self-pay | Admitting: Gastroenterology

## 2015-10-04 NOTE — Telephone Encounter (Signed)
Patient is calling again to reschedule the procedure that is scheduled 5/10 at [facility]

## 2015-10-04 NOTE — Telephone Encounter (Signed)
See additional phone note. 

## 2015-10-04 NOTE — Telephone Encounter (Signed)
Spoke with pt and she cannot come on 10/06/15 for ph probe due to work. Spoke with Sharee Pimple at [facility] endo and they were working with her to do the procedure on the 10th. State that these really need to be done on a Monday and they will not schedule another day. Pt aware, procedure cancelled for the 10th and pt will call when she knows she is going to be off on a Monday.

## 2015-10-06 ENCOUNTER — Encounter (HOSPITAL_COMMUNITY): Admission: RE | Payer: Self-pay | Source: Ambulatory Visit

## 2015-10-06 ENCOUNTER — Ambulatory Visit (HOSPITAL_COMMUNITY)
Admission: RE | Admit: 2015-10-06 | Payer: BLUE CROSS/BLUE SHIELD | Source: Ambulatory Visit | Admitting: Gastroenterology

## 2015-10-06 SURGERY — MONITORING, ESOPHAGEAL PH, 24 HOUR

## 2015-10-07 DIAGNOSIS — K219 Gastro-esophageal reflux disease without esophagitis: Secondary | ICD-10-CM | POA: Insufficient documentation

## 2015-10-08 ENCOUNTER — Telehealth: Payer: Self-pay | Admitting: Gastroenterology

## 2015-10-08 DIAGNOSIS — R1310 Dysphagia, unspecified: Secondary | ICD-10-CM

## 2015-10-08 NOTE — Telephone Encounter (Signed)
Esophageal manometry completed, results as follows: Impressions  Hypotensive EG junction with evidence of relaxation after deglutition Findings suggestive of Fragmented Peristalsis, otherwise no other significant esophageal peristaltic abnormality was detected on this study based on [place] classification   Overall, there is some fragmented peristalsis which could be contributing to dysphagia. EGD did not show any clear pathology to cause her dysphagia otherwise. She has a hypotensive EG junction which may likely be contributing to her reflux. I am awaiting the results of [place] study. I will contact the patient with the results.

## 2015-10-11 NOTE — Telephone Encounter (Signed)
Called and left a message, no answer 

## 2015-10-12 NOTE — Telephone Encounter (Signed)
Called again and no answer, it is unclear if this is the correct voicemail for this patient. She has not returned my calls.  Valerie [name] can you please help clarify the correct phone number for this patient? If you are able to reach please, please let her know findings as below. Is she able to schedule the pH study? I am happy to chat with her about this further to discuss options. Thanks

## 2015-10-13 ENCOUNTER — Encounter: Payer: Self-pay | Admitting: *Deleted

## 2015-10-13 NOTE — Telephone Encounter (Signed)
Spoke with patient and she agreed to do the Ph study on 11/01/15. Scheduled procedure with Sharee Pimple at [facility] on 11/01/15. Instructions mailed to patient.

## 2015-10-13 NOTE — Telephone Encounter (Signed)
Patient given results and recommendations. She is having to work around her schedule.

## 2015-10-13 NOTE — Telephone Encounter (Signed)
Patient aware of appointment

## 2015-10-15 ENCOUNTER — Ambulatory Visit: Payer: BLUE CROSS/BLUE SHIELD | Admitting: Gastroenterology

## 2015-10-18 ENCOUNTER — Encounter: Payer: Self-pay | Admitting: *Deleted

## 2015-10-20 ENCOUNTER — Encounter: Payer: Self-pay | Admitting: Gastroenterology

## 2015-10-20 ENCOUNTER — Ambulatory Visit (INDEPENDENT_AMBULATORY_CARE_PROVIDER_SITE_OTHER): Payer: BLUE CROSS/BLUE SHIELD | Admitting: Gastroenterology

## 2015-10-20 VITALS — BP 122/70 | HR 72 | Ht 65.0 in | Wt 151.0 lb

## 2015-10-20 DIAGNOSIS — K219 Gastro-esophageal reflux disease without esophagitis: Secondary | ICD-10-CM | POA: Diagnosis not present

## 2015-10-20 DIAGNOSIS — K602 Anal fissure, unspecified: Secondary | ICD-10-CM | POA: Diagnosis not present

## 2015-10-20 DIAGNOSIS — R1310 Dysphagia, unspecified: Secondary | ICD-10-CM | POA: Diagnosis not present

## 2015-10-20 NOTE — Patient Instructions (Addendum)
Please keep appointment for your 24 hour [place] study.  Continue your current medications.  We will contact you with the results of your [place] study after we receive results.  If you are age 63 or older, your body mass index should be between 23-30. Your Body mass index is 25.13 kg/(m^2). If this is out of the aforementioned range listed, please consider follow up with your Primary Care Provider.  If you are age 20 or younger, your body mass index should be between 19-25. Your Body mass index is 25.13 kg/(m^2). If this is out of the aformentioned range listed, please consider follow up with your Primary Care Provider.

## 2015-10-20 NOTE — Progress Notes (Signed)
HPI :  INTAKE VISIT: 65 y/o female with history of HTN seen in consultation for GERD and rectal bleeding  She has had longstanding acid reflux. She reports it bothers her fairly frequently. She has pyrosis / regurgitation, and belching. She takes omeprazole 40mg  daily which has been her regimen for the past 6 months. She has frequent breakthrough despite taking this dosing, daily breakthrough. She has symptoms both during the day and also at night. She has been on protonix, nexium, and prilosec in the past, and Dexilant - she thinks omeprazole 40mg  has worked the best of all of them. If she does not take it at all she will feel even worse. She has dysphagia with solids and with pills. No liquid dysphagia. She will feel it in the sternal notch. She needs to drink water to push things through. She will have this rarely, not routinely. This dysphagia is new and has been bothering her for 3-4 months. No weight loss unexpectedly, she has lost 30 lbs but trying to do so. She is taking carafate once or twice per day, doesn't help too much. She takes tablet form. Prior EGD done in 2005 showing esophagitis and was dilated to 46mm Savory. She has a history of osteopenia.  She also has endorsed some blood in the stools for 3-4 months. She will see this usually if hemorrhoids are prolapsed. She lifts heavy weight for her work which can prolapse her hemorrhoids, at least this is what she thinks it is. She was told she had both internal and external hemorrhoids. Hemorrhoid does not spontaneously reduce, they have to be manually reduced. She has one BM per day or so. She has occasional constipation, occasional loose stool, she thinks looser stool is more common for her. She has occasional lower abdominal cramping. Prior to 3-4 months she had not seen any blood in the stools. She has some perianal pain associated with this as well. She has had a colonoscopy done in [place] a few years ago and thinks she had a polyp  removed but is not sure.   Prior endoscopic evaluation: EGD 2005 - LA grade A esophagitis, empiric dilation to 39mm Savory Colonoscopy done in [place] a few years ago, she thinks she may have had a polyp removed  SINCE LAST VISIT:  EGD done on 08/30/15 showing mild LA Grade A esophagitis. Otherwise a normal study  Esophageal manometry done showing hypotensive EG junction with evidence of relaxation after deglutition. Findings suggestive of Fragmented Peristalsis, otherwise no other significant esophageal peristaltic abnormality was detected on this study based on [place] classification   She has not yet had the pH study as previously recommended. She is scheduled for June 4th/5th.   She continues to have a lot of heartburn, She is taking omeprazole 40mg  once daily and zantac 150mg  BID - she has noted only mild improvement with zantac. She continues to have symptoms daily. As per her prior note, she has never had good relief of symptoms with routine PPI use. She has regurgitatoin and pyrosis. Symptoms are mild right now, sometimes worse than others. She reports dysphagia continues and bothers her intermittently.  She has dysphagia with solids and with pills. No liquid dysphagia. She will feel it in the sternal notch. She needs to drink water to push things through. She denies any coughing with swallows or choking. No nasal regurgitation.   Colonoscopy report was obtained, was done in 2011, no polyps removed, hemorrhoids noted. She was since treated for anal fissure with  nitroglycerin ointment which resolved her bleeding. Hemorrhoids are not bothering her right now. No FH of colon cancer. No blood in the stools.    Past Medical History  Diagnosis Date  . Hypertension   . Acid reflux   . Hernia     Present to proximal to umbillical   . Menorrhagia   . Osteopenia   . Vitamin D deficiency   . Atrophic vaginitis   . Adenocarcinoma of the endometrium/uterus ([place]) 12/04/2000  . Vertigo   .  PONV (postoperative nausea and vomiting)   . Esophagitis   . Anal fissure   . Internal hemorrhoids      Past Surgical History  Procedure Laterality Date  . Tubal ligation    . Cholecystectomy  1997  . Hysteroscopy  11/15/2000    D & C, resection of endometrial polyps  . Dilation and curettage of uterus      11/15/2000  . Abdominal hysterectomy  12/04/2000    also bilateral salpingo-oophorectomy  . Oophorectomy  12/04/2000    bilateral salpingo-oophorectomy done with TAH  . Carpal tunnel release  11/20/2011    Procedure: CARPAL TUNNEL RELEASE;  Surgeon: Elaina Hoops, MD;  Location: [facility];  Service: Neurosurgery;  Laterality: Left;  LEFT carpal tunnel release  . Cardiac catheterization  2006  . Esophageal manometry N/A 09/20/2015    Procedure: ESOPHAGEAL MANOMETRY (EM);  Surgeon: Manus Gunning, MD;  Location: [facility];  Service: Gastroenterology;  Laterality: N/A;   Family History  Problem Relation Age of Onset  . Cancer Father     prostate cancer  . Hypertension Father   . Hypertension Sister   . Diabetes Brother   . Heart disease Brother     Deceased   . Hypertension Brother   . Diabetes Brother   . Heart disease Brother     Deceased   Social History  Substance Use Topics  . Smoking status: Never Smoker   . Smokeless tobacco: Never Used  . Alcohol Use: No   Current Outpatient Prescriptions  Medication Sig Dispense Refill  . aspirin EC 81 MG tablet Take 81 mg by mouth daily.    Marland Kitchen atorvastatin (LIPITOR) 10 MG tablet TAKE ONE TABLET BY MOUTH ONCE DAILY 90 tablet 1  . metoprolol succinate (TOPROL XL) 50 MG 24 hr tablet Take 1 tablet (50 mg total) by mouth daily. Take with or immediately following a meal. 90 tablet 1  . omeprazole (PRILOSEC) 40 MG capsule Take 1 capsule (40 mg total) by mouth daily. 90 capsule 1  . Vitamin D, Ergocalciferol, (DRISDOL) 50000 UNITS CAPS capsule Take 1 capsule (50,000 Units total) by mouth every 7 (seven) days. Take on  Thursdays. 30 capsule 0  . AMBULATORY NON FORMULARY MEDICATION Medication Name: Nitroglycerin ointment 30 g 1  . Calcium Carbonate-Vit D-Min (CALTRATE 600+D PLUS PO) Take 1 tablet by mouth 2 (two) times daily.      . fluticasone (FLONASE) 50 MCG/ACT nasal spray Place 2 sprays into the nose daily.      . hydrochlorothiazide (HYDRODIURIL) 25 MG tablet TAKE ONE TABLET BY MOUTH ONCE DAILY 90 tablet 0  . hydrocortisone (ANUSOL-HC) 2.5 % rectal cream Place 1 application rectally 2 (two) times daily. 30 g 0  . hydrocortisone (ANUSOL-HC) 25 MG suppository Place 1 suppository (25 mg total) rectally 2 (two) times daily. 12 suppository 1  . phenazopyridine (PYRIDIUM) 200 MG tablet Take 1 tablet (200 mg total) by mouth 3 (three) times daily as needed for pain.  10 tablet 0  . ranitidine (ZANTAC) 150 MG tablet Take 1 tablet (150 mg total) by mouth 2 (two) times daily. 120 tablet 3  . sucralfate (CARAFATE) 1 G tablet Take 1 tablet (1 g total) by mouth 4 (four) times daily -  with meals and at bedtime. 120 tablet 0  . tiZANidine (ZANAFLEX) 2 MG tablet Take 1 tablet (2 mg total) by mouth every 8 (eight) hours as needed for muscle spasms. 30 tablet 1  . valsartan (DIOVAN) 320 MG tablet Take 1 tablet (320 mg total) by mouth daily. 90 tablet 1   No current facility-administered medications for this visit.   No Known Allergies   Review of Systems: All systems reviewed and negative except where noted in HPI.   Lab Results  Component Value Date   WBC 6.6 12/31/2014   HGB 12.6 12/31/2014   HCT 37.4 12/31/2014   MCV 93.7 12/31/2014   PLT 239.0 12/31/2014    Lab Results  Component Value Date   ALT 21 12/31/2014   AST 25 12/31/2014   ALKPHOS 53 12/31/2014   BILITOT 0.6 12/31/2014   Lab Results  Component Value Date   CREATININE 0.94 12/31/2014   BUN 22 12/31/2014   NA 143 12/31/2014   K 3.9 12/31/2014   CL 107 12/31/2014   CO2 29 12/31/2014     Physical Exam: BP 122/70 mmHg  Pulse 72  Ht 5'  5" (1.651 m)  Wt 151 lb (68.493 kg)  BMI 25.13 kg/m2 Constitutional: Pleasant,well-developed, female in no acute distress. HEENT: Normocephalic and atraumatic. Conjunctivae are normal. No scleral icterus. Neck supple.  Cardiovascular: Normal rate, regular rhythm.  Pulmonary/chest: Effort normal and breath sounds normal. No wheezing, rales or rhonchi. Abdominal: Soft, nondistended, nontender. Bowel sounds active throughout. There are no masses palpable. No hepatomegaly. Extremities: no edema Lymphadenopathy: No cervical adenopathy noted. Neurological: Alert and oriented to person place and time. Skin: Skin is warm and dry. No rashes noted. Psychiatric: Normal mood and affect. Behavior is normal.   ASSESSMENT AND PLAN: 65 y/o female here for follow up to address the following issues:  GERD / dysphagia - longstanding reflux despite trial of multiple PPIs and zantac. EGD showed very mild esophagitis but otherwise no stenosis / stricture. Manometry showed fragmented peristalsis and hypotensive GEJ. I suspect the manometry finding may be causing her dysphagia, as no pathology is noted otherwise, and she does not have any symptoms of oropharyngeal dysphagia. I discussed with her that she has a nonspecific mild motility disorder, but unfortunately no good medical therapy to help this at present time. Her hypotensive GEJ may be driving her refractory reflux symptoms. She is scheduled for 24 HR pH impedance study to be done ON PPI to see if she is having breakthrough acid reflux, nonacid reflux, vs. Hypersensitive esophagus. We may consider a trial of baclofen pending this result if she has refractory reflux. I don't think she is a good surgical candidate for reflux treatment given her dysphagia and esophageal dysmotility. I will contact her with results of pH testing with further recommendations. She agreed.   Anal fissure -  resolved with therapy, follow up as needed for any recurrence of  symptoms  Colon cancer screening - due to 2021 given last exam in 2011 was normal without polyps   Morley Cellar, MD [facility] Pager [phone]

## 2015-10-29 ENCOUNTER — Other Ambulatory Visit: Payer: Self-pay | Admitting: Primary Care

## 2015-11-01 ENCOUNTER — Encounter (HOSPITAL_COMMUNITY): Admission: RE | Payer: Self-pay | Source: Ambulatory Visit

## 2015-11-01 ENCOUNTER — Ambulatory Visit (HOSPITAL_COMMUNITY)
Admission: RE | Admit: 2015-11-01 | Payer: BLUE CROSS/BLUE SHIELD | Source: Ambulatory Visit | Admitting: Gastroenterology

## 2015-11-01 SURGERY — MONITORING, ESOPHAGEAL PH, 24 HOUR

## 2015-11-03 ENCOUNTER — Telehealth: Payer: Self-pay | Admitting: Gastroenterology

## 2015-11-03 NOTE — Telephone Encounter (Signed)
Spoke with patient and she states she was told she did not have appointment so she did not go for Ph probe on 11/01/15. Spoke with Sharee Pimple and patient did not show for appointment. Scheduled again on 11/29/15 at 8:30 AM for 24 hour pH probe at [facility].

## 2015-11-04 ENCOUNTER — Other Ambulatory Visit: Payer: Self-pay | Admitting: Primary Care

## 2015-11-04 DIAGNOSIS — E559 Vitamin D deficiency, unspecified: Secondary | ICD-10-CM

## 2015-11-04 DIAGNOSIS — E785 Hyperlipidemia, unspecified: Secondary | ICD-10-CM

## 2015-11-04 DIAGNOSIS — I10 Essential (primary) hypertension: Secondary | ICD-10-CM

## 2015-11-04 DIAGNOSIS — Z1159 Encounter for screening for other viral diseases: Secondary | ICD-10-CM

## 2015-11-05 ENCOUNTER — Other Ambulatory Visit (INDEPENDENT_AMBULATORY_CARE_PROVIDER_SITE_OTHER): Payer: BLUE CROSS/BLUE SHIELD

## 2015-11-05 DIAGNOSIS — I10 Essential (primary) hypertension: Secondary | ICD-10-CM

## 2015-11-05 DIAGNOSIS — E785 Hyperlipidemia, unspecified: Secondary | ICD-10-CM

## 2015-11-05 DIAGNOSIS — E559 Vitamin D deficiency, unspecified: Secondary | ICD-10-CM | POA: Diagnosis not present

## 2015-11-05 DIAGNOSIS — Z1159 Encounter for screening for other viral diseases: Secondary | ICD-10-CM

## 2015-11-05 LAB — COMPREHENSIVE METABOLIC PANEL
ALT: 17 U/L (ref 0–35)
AST: 21 U/L (ref 0–37)
Albumin: 4.2 g/dL (ref 3.5–5.2)
Alkaline Phosphatase: 49 U/L (ref 39–117)
BUN: 23 mg/dL (ref 6–23)
CO2: 29 mEq/L (ref 19–32)
Calcium: 9.4 mg/dL (ref 8.4–10.5)
Chloride: 106 mEq/L (ref 96–112)
Creatinine, Ser: 0.9 mg/dL (ref 0.40–1.20)
GFR: 67.17 mL/min (ref >60.00)
Glucose, Bld: 98 mg/dL (ref 70–99)
Potassium: 4.2 mEq/L (ref 3.5–5.1)
Sodium: 141 mEq/L (ref 135–145)
Total Bilirubin: 0.9 mg/dL (ref 0.2–1.2)
Total Protein: 7.3 g/dL (ref 6.0–8.3)

## 2015-11-05 LAB — VITAMIN D 25 HYDROXY (VIT D DEFICIENCY, FRACTURES): VITD: 22.64 ng/mL — ABNORMAL LOW (ref 30.00–100.00)

## 2015-11-05 LAB — LIPID PANEL
Cholesterol: 159 mg/dL (ref 0–200)
HDL: 43.6 mg/dL (ref >39.00)
LDL Cholesterol: 100 mg/dL — ABNORMAL HIGH (ref 0–99)
NonHDL: 115.18
Total CHOL/HDL Ratio: 4
Triglycerides: 78 mg/dL (ref 0.0–149.0)
VLDL: 15.6 mg/dL (ref 0.0–40.0)

## 2015-11-05 LAB — HEMOGLOBIN A1C: Hgb A1c MFr Bld: 5.7 % (ref 4.6–6.5)

## 2015-11-06 LAB — HEPATITIS C ANTIBODY: HCV Ab: REACTIVE — AB

## 2015-11-09 ENCOUNTER — Other Ambulatory Visit: Payer: Self-pay | Admitting: Primary Care

## 2015-11-09 DIAGNOSIS — K219 Gastro-esophageal reflux disease without esophagitis: Secondary | ICD-10-CM

## 2015-11-09 LAB — HEPATITIS C RNA QUANTITATIVE: HCV Quantitative: NOT DETECTED IU/mL (ref <15)

## 2015-11-09 MED ORDER — OMEPRAZOLE 40 MG PO CPDR: 40.0000 mg | DELAYED_RELEASE_CAPSULE | Freq: Every day | ORAL | Status: DC

## 2015-11-09 NOTE — Telephone Encounter (Signed)
Received refill faxed request for omeprazole 40 mg. Last seen on 08/10/2015.  Already sent refill to [facility].

## 2015-11-16 ENCOUNTER — Encounter: Payer: Self-pay | Admitting: Primary Care

## 2015-11-16 ENCOUNTER — Ambulatory Visit (INDEPENDENT_AMBULATORY_CARE_PROVIDER_SITE_OTHER): Payer: BLUE CROSS/BLUE SHIELD | Admitting: Primary Care

## 2015-11-16 ENCOUNTER — Encounter: Payer: BLUE CROSS/BLUE SHIELD | Admitting: Primary Care

## 2015-11-16 VITALS — BP 116/74 | HR 63 | Temp 97.5°F | Ht 65.0 in | Wt 154.0 lb

## 2015-11-16 DIAGNOSIS — Z0000 Encounter for general adult medical examination without abnormal findings: Secondary | ICD-10-CM

## 2015-11-16 DIAGNOSIS — I10 Essential (primary) hypertension: Secondary | ICD-10-CM

## 2015-11-16 DIAGNOSIS — M8580 Other specified disorders of bone density and structure, unspecified site: Secondary | ICD-10-CM

## 2015-11-16 DIAGNOSIS — K649 Unspecified hemorrhoids: Secondary | ICD-10-CM

## 2015-11-16 DIAGNOSIS — E785 Hyperlipidemia, unspecified: Secondary | ICD-10-CM | POA: Insufficient documentation

## 2015-11-16 DIAGNOSIS — K219 Gastro-esophageal reflux disease without esophagitis: Secondary | ICD-10-CM

## 2015-11-16 DIAGNOSIS — E559 Vitamin D deficiency, unspecified: Secondary | ICD-10-CM

## 2015-11-16 NOTE — Assessment & Plan Note (Signed)
Overall stable, following with GI. Improvement with nitroglycerin ointment.

## 2015-11-16 NOTE — Assessment & Plan Note (Signed)
Stable on statin. Continue lipitor. Repeat lipids in 1 year.

## 2015-11-16 NOTE — Patient Instructions (Signed)
Stop taking the hydrochlorothiazide tablets for blood pressure as your blood pressure is getting too low.  Please check your blood pressure over the next 2 weeks and I will call you for your readings. Call me sooner if your blood pressure gets at or above 150/90 consistently.  Start Vitamin D 1000 units daily. This may be purchased over the counter.  Start exercising. You should be getting 1 hour of moderate intensity exercise 5 days weekly.  Ensure you are consuming 64 ounces of water daily.  Follow up in 1 year for repeat physical or sooner if needed.  It was a pleasure to see you today!

## 2015-11-16 NOTE — Assessment & Plan Note (Signed)
Discussed daily calcium with vitamin D. Vitamin D level low, will add in extra 1000 units daily.

## 2015-11-16 NOTE — Progress Notes (Signed)
Pre visit review using our clinic review tool, if applicable. No additional management support is needed unless otherwise documented below in the visit note. 

## 2015-11-16 NOTE — Assessment & Plan Note (Signed)
Level of 22. Will add in 1000 units daily.

## 2015-11-16 NOTE — Assessment & Plan Note (Signed)
Home readings too low in 80/50-70/50's with dizziness. Will hold HCTZ for now. She will check BP in 2 weeks and send me readings. Continue Diovan and Toprol XL for now.

## 2015-11-16 NOTE — Assessment & Plan Note (Signed)
Td, shingles, flu UTD. Pap, mammogram, bone density testing, colonoscopy UTD. Fair diet. Encouraged increase intake of water, vegetables, fruit, lean protein. Discussed to start exercising daily for a weight loss goal of 10 pounds. Exam unremarkable. Labs unremarkable. Follow up in 1 year for repeat physical.

## 2015-11-16 NOTE — Progress Notes (Signed)
Subjective:    Patient ID: Valerie [name], female    DOB: November 22, 1950, 65 y.o.   MRN: [id number]  HPI  Valerie [name] is a 65 year old female who presents today for complete physical.  Immunizations: -Tetanus: Completed in April 2017 -Influenza: Did complete last season.  -Shingles: Completed in March 2017  Diet: She endorses a fair diet. Breakfast: Crackers Lunch: Skips, fruit Dinner: Chicken, vegetables, salad Snacks: Occasionally, yogurt, crackers Desserts: Occasionally Beverages: Water (3-4 glasses daily), un sweet tea  Exercise: She does not currently exercise, plans on starting walking.  Eye exam: Completed in 2016 Dental exam: Completed 1 year ago.  Colonoscopy: Completed in 2011 Pap Smear: Hysterectomy Mammogram: Completed in February 2017   Review of Systems  Constitutional: Negative for unexpected weight change.  HENT: Positive for postnasal drip and rhinorrhea.   Respiratory: Negative for cough and shortness of breath.   Cardiovascular: Negative for chest pain.  Gastrointestinal: Negative for diarrhea and constipation.       Esophageal reflux, currently following with GI for further work up.  Hemorrhoids, stable, currently following with GI.  Genitourinary: Negative for difficulty urinating and menstrual problem.  Musculoskeletal: Negative for myalgias and arthralgias.  Skin: Negative for rash.  Allergic/Immunologic: Positive for environmental allergies.  Neurological: Negative for numbness and headaches.       Occasional dizziness with position changes.  Psychiatric/Behavioral:       Denies concerns for anxiety or depression       Past Medical History  Diagnosis Date  . Hypertension   . Acid reflux   . Hernia     Present to proximal to umbillical   . Menorrhagia   . Osteopenia   . Vitamin D deficiency   . Atrophic vaginitis   . Adenocarcinoma of the endometrium/uterus ([place]) 12/04/2000  . Vertigo   . PONV (postoperative nausea and vomiting)   .  Esophagitis   . Anal fissure   . Internal hemorrhoids      Social History   Social History  . Marital Status: Married    Spouse Name: N/A  . Number of Children: N/A  . Years of Education: N/A   Occupational History  . Not on file.   Social History Main Topics  . Smoking status: Never Smoker   . Smokeless tobacco: Never Used  . Alcohol Use: No  . Drug Use: Not on file  . Sexual Activity: Not on file   Other Topics Concern  . 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       Past Surgical History  Procedure Laterality Date  . Tubal ligation    . Cholecystectomy  1997  . Hysteroscopy  11/15/2000    D & C, resection of endometrial polyps  . Dilation and curettage of uterus      11/15/2000  . Abdominal hysterectomy  12/04/2000    also bilateral salpingo-oophorectomy  . Oophorectomy  12/04/2000    bilateral salpingo-oophorectomy done with TAH  . Carpal tunnel release  11/20/2011    Procedure: CARPAL TUNNEL RELEASE;  Surgeon: Elaina Hoops, MD;  Location: [facility];  Service: Neurosurgery;  Laterality: Left;  LEFT carpal tunnel release  . Cardiac catheterization  2006  . Esophageal manometry N/A 09/20/2015    Procedure: ESOPHAGEAL MANOMETRY (EM);  Surgeon: Manus Gunning, MD;  Location: [facility];  Service: Gastroenterology;  Laterality: N/A;    Family History  Problem Relation Age of Onset  . Cancer Father     prostate cancer  . Hypertension Father   . Hypertension Sister   . Diabetes Brother   . Heart disease Brother     Deceased   . Hypertension Brother   . Diabetes Brother   . Heart disease Brother     Deceased    No Known Allergies  Current Outpatient Prescriptions on File Prior to Visit  Medication Sig Dispense Refill  . aspirin EC 81 MG tablet Take 81 mg by mouth daily.    Marland Kitchen atorvastatin (LIPITOR) 10 MG tablet TAKE ONE  TABLET BY MOUTH ONCE DAILY 90 tablet 1  . Calcium Carbonate-Vit D-Min (CALTRATE 600+D PLUS PO) Take 1 tablet by mouth 2 (two) times daily.      . fluticasone (FLONASE) 50 MCG/ACT nasal spray Place 2 sprays into the nose daily as needed for allergies.     . hydrochlorothiazide (HYDRODIURIL) 25 MG tablet TAKE ONE TABLET BY MOUTH ONCE DAILY 90 tablet 0  . metoprolol succinate (TOPROL-XL) 50 MG 24 hr tablet TAKE ONE TABLET BY MOUTH ONCE DAILY. TAKE WITH OR IMMEDIATELY FOLLOWING A MEAL 90 tablet 1  . nitroGLYCERIN (NITROGLYN) 2 % ointment Apply 1 inch topically 4 (four) times daily as needed for chest pain.    Marland Kitchen omeprazole (PRILOSEC) 40 MG capsule Take 1 capsule (40 mg total) by mouth daily. 90 capsule 1  . ranitidine (ZANTAC) 150 MG tablet Take 1 tablet (150 mg total) by mouth 2 (two) times daily. 120 tablet 3  . valsartan (DIOVAN) 320 MG tablet Take 1 tablet (320 mg total) by mouth daily. 90 tablet 1  . Vitamin D, Ergocalciferol, (DRISDOL) 50000 UNITS CAPS capsule Take 1 capsule (50,000 Units total) by mouth every 7 (seven) days. Take on Thursdays. (Patient taking differently: Take 50,000 Units by mouth every Thursday. ) 30 capsule 0   No current facility-administered medications on file prior to visit.    BP 116/74 mmHg  Pulse 63  Temp(Src) 97.5 F (36.4 C) (Oral)  Ht 5\' 5"  (1.651 m)  Wt 154 lb (69.854 kg)  BMI 25.63 kg/m2  SpO2 95%    Objective:   Physical Exam  Constitutional: She is oriented to person, place, and time. She appears well-nourished.  HENT:  Right Ear: Tympanic membrane and ear canal normal.  Left Ear: Tympanic membrane and ear canal normal.  Nose: Nose normal.  Mouth/Throat: Oropharynx is clear and moist.  Eyes: Conjunctivae and EOM are normal. Pupils are equal, round, and reactive to light.  Neck: Neck supple. No thyromegaly present.  Cardiovascular: Normal rate and regular rhythm.   No murmur heard. Pulmonary/Chest: Effort normal and breath sounds normal. She has  no rales.  Abdominal: Soft. Bowel sounds are normal.  Musculoskeletal: Normal range of motion.  Lymphadenopathy:    She has no cervical adenopathy.  Neurological: She is alert and oriented to person, place, and time. She has normal reflexes. No cranial nerve deficit.  Skin: Skin is warm and dry. No rash noted.  Psychiatric: She has a normal mood and affect.          Assessment & Plan:

## 2015-11-16 NOTE — Assessment & Plan Note (Signed)
Currently following with GI for mammometry. Due in early July for re-evaluation. Continue PPI.

## 2015-11-22 ENCOUNTER — Telehealth: Payer: Self-pay | Admitting: Gastroenterology

## 2015-11-22 NOTE — Telephone Encounter (Signed)
Spoke with patient and confirmed that she is still scheduled for 24 PH probe on 11/29/15.

## 2015-11-29 ENCOUNTER — Encounter (HOSPITAL_COMMUNITY)
Admission: RE | Disposition: A | Discharge status: Home / Self Care | Payer: Self-pay | Source: Ambulatory Visit | Attending: Gastroenterology

## 2015-11-29 ENCOUNTER — Ambulatory Visit (HOSPITAL_COMMUNITY)
Admission: RE | Admit: 2015-11-29 | Discharge: 2015-11-29 | Disposition: A | Discharge status: Home / Self Care | Payer: BLUE CROSS/BLUE SHIELD | Source: Ambulatory Visit | Attending: Gastroenterology | Admitting: Gastroenterology

## 2015-11-29 DIAGNOSIS — R12 Heartburn: Secondary | ICD-10-CM | POA: Insufficient documentation

## 2015-11-29 DIAGNOSIS — K219 Gastro-esophageal reflux disease without esophagitis: Secondary | ICD-10-CM | POA: Diagnosis not present

## 2015-11-29 HISTORY — PX: 24 HOUR PH STUDY: SHX5419

## 2015-11-29 SURGERY — MONITORING, ESOPHAGEAL PH, 24 HOUR

## 2015-11-29 MED ORDER — LIDOCAINE VISCOUS 2 % MT SOLN
OROMUCOSAL | Status: AC
  Filled 2015-11-29: qty 15

## 2015-11-29 NOTE — Progress Notes (Signed)
pH probe placed per protocol at 31.5 cm.  Patient remained on PPIs for procedure per Dr. [place] Cellar. Patient instructed on use of Digitrapper.  Patient instructed to return tomorrow on 11/30/15 for removal of probe at 0900.

## 2015-12-01 ENCOUNTER — Encounter (HOSPITAL_COMMUNITY): Payer: Self-pay | Admitting: Gastroenterology

## 2015-12-03 ENCOUNTER — Telehealth: Payer: Self-pay | Admitting: Gastroenterology

## 2015-12-03 DIAGNOSIS — K219 Gastro-esophageal reflux disease without esophagitis: Secondary | ICD-10-CM

## 2015-12-03 NOTE — Telephone Encounter (Signed)
Patient underwent 24 hour pH testing for evaluation of refractory GERD symptoms on PPI.   Results as follows: Study performed ON PPI  Results: - Study time of 24 hours - adequate study time - Demeester score of 1.5 (14.72 = upper limit of normal) - no pathologic acid reflux - Symptom index of 0% for reflux episodes (none of the 10 episodes of reported symptoms were due to reflux)  Summary and Interpretation: - this is a normal study without pathologic reflux. Study shows reflux is well controlled on present dose of medications - none of the patient's reported symptoms were due to reflux - this study is consistent with functional heartburn if the patient continues to have heartburn symptoms   I will contact the patient with results and further recommendations, I would consider a trial of TCA to treat functional heartburn

## 2015-12-06 MED ORDER — DESIPRAMINE HCL 10 MG PO TABS: ORAL_TABLET | ORAL | Status: DC

## 2015-12-06 NOTE — Telephone Encounter (Signed)
Called patient and discussed results as below. She continues to have frequent reflux symptoms but Demeester is normal and symptom index of 0%, thus she has a component of functional heartburn based on this exam.  I offered her a trial of TCA to decrease esophageal hypersensitivity. I discussed the risks / benefits with her and will try her on a desipramine 10mg  HS for 2 weeks, then increase to 20mg  q HS and see if this helps. She can follow up in a few months for reassessment. She agreed with the plan. Call in the interim with any questions.

## 2016-01-11 ENCOUNTER — Other Ambulatory Visit: Payer: Self-pay | Admitting: Primary Care

## 2016-01-11 DIAGNOSIS — I10 Essential (primary) hypertension: Secondary | ICD-10-CM

## 2016-03-29 ENCOUNTER — Other Ambulatory Visit: Payer: Self-pay | Admitting: Primary Care

## 2016-04-06 ENCOUNTER — Other Ambulatory Visit: Payer: Self-pay | Admitting: Primary Care

## 2016-05-09 ENCOUNTER — Other Ambulatory Visit: Payer: Self-pay | Admitting: *Deleted

## 2016-05-09 DIAGNOSIS — K219 Gastro-esophageal reflux disease without esophagitis: Secondary | ICD-10-CM

## 2016-05-09 MED ORDER — OMEPRAZOLE 40 MG PO CPDR: 40.0000 mg | DELAYED_RELEASE_CAPSULE | Freq: Every day | ORAL | 1 refills | Status: DC

## 2016-06-02 ENCOUNTER — Ambulatory Visit (INDEPENDENT_AMBULATORY_CARE_PROVIDER_SITE_OTHER): Payer: BLUE CROSS/BLUE SHIELD | Admitting: Internal Medicine

## 2016-06-02 ENCOUNTER — Encounter: Payer: Self-pay | Admitting: Internal Medicine

## 2016-06-02 VITALS — BP 116/78 | HR 65 | Temp 97.8°F | Wt 157.0 lb

## 2016-06-02 DIAGNOSIS — J309 Allergic rhinitis, unspecified: Secondary | ICD-10-CM

## 2016-06-02 MED ORDER — FLUTICASONE PROPIONATE 50 MCG/ACT NA SUSP: 2.0000 | Freq: Every day | NASAL | 2 refills | Status: DC | PRN

## 2016-06-02 NOTE — Patient Instructions (Signed)
Allergic Rhinitis Allergic rhinitis is when the mucous membranes in the nose respond to allergens. Allergens are particles in the air that cause your body to have an allergic reaction. This causes you to release allergic antibodies. Through a chain of events, these eventually cause you to release histamine into the blood stream. Although meant to protect the body, it is this release of histamine that causes your discomfort, such as frequent sneezing, congestion, and an itchy, runny nose. What are the causes? Seasonal allergic rhinitis (hay fever) is caused by pollen allergens that may come from grasses, trees, and weeds. Year-round allergic rhinitis (perennial allergic rhinitis) is caused by allergens such as house dust mites, pet dander, and mold spores. What are the signs or symptoms?  Nasal stuffiness (congestion).  Itchy, runny nose with sneezing and tearing of the eyes. How is this diagnosed? Your health care provider can help you determine the allergen or allergens that trigger your symptoms. If you and your health care provider are unable to determine the allergen, skin or blood testing may be used. Your health care provider will diagnose your condition after taking your health history and performing a physical exam. Your health care provider may assess you for other related conditions, such as asthma, pink eye, or an ear infection. How is this treated? Allergic rhinitis does not have a cure, but it can be controlled by:  Medicines that block allergy symptoms. These may include allergy shots, nasal sprays, and oral antihistamines.  Avoiding the allergen. Hay fever may often be treated with antihistamines in pill or nasal spray forms. Antihistamines block the effects of histamine. There are over-the-counter medicines that may help with nasal congestion and swelling around the eyes. Check with your health care provider before taking or giving this medicine. If avoiding the allergen or the  medicine prescribed do not work, there are many new medicines your health care provider can prescribe. Stronger medicine may be used if initial measures are ineffective. Desensitizing injections can be used if medicine and avoidance does not work. Desensitization is when a patient is given ongoing shots until the body becomes less sensitive to the allergen. Make sure you follow up with your health care provider if problems continue. Follow these instructions at home: It is not possible to completely avoid allergens, but you can reduce your symptoms by taking steps to limit your exposure to them. It helps to know exactly what you are allergic to so that you can avoid your specific triggers. Contact a health care provider if:  You have a fever.  You develop a cough that does not stop easily (persistent).  You have shortness of breath.  You start wheezing.  Symptoms interfere with normal daily activities. This information is not intended to replace advice given to you by your health care provider. Make sure you discuss any questions you have with your health care provider. Document Released: 02/07/2001 Document Revised: 01/14/2016 Document Reviewed: 01/20/2013 Elsevier Interactive Patient Education  2017 [employer]  

## 2016-06-02 NOTE — Progress Notes (Signed)
Subjective:    Patient ID: Valerie [name], female    DOB: Dec 08, 1950, 66 y.o.   MRN: [id number]  HPI  Pt presents to the clinic today with c/o headache, runny nose, sore throat and cough. This started 1 month ago. She is blowing clear mucous out of her nose. She denies difficulty swallowing. The cough is productive of yellow mucous. She denies fever, or chills but has been fatigued. She feels like the drainage has causes some nausea and epigastric pain. She has tried some acid reflux medication without relief. She has a history of ongoing stomach issues for which she is following with GI for, and reports these symptoms feel different. She has no history of seasonal allergies that she is aware of.  Review of Systems  Past Medical History:  Diagnosis Date  . Acid reflux   . Adenocarcinoma of the endometrium/uterus ([place]) 12/04/2000  . Anal fissure   . Atrophic vaginitis   . Esophagitis   . Hernia    Present to proximal to umbillical   . Hypertension   . Internal hemorrhoids   . Menorrhagia   . Osteopenia   . PONV (postoperative nausea and vomiting)   . Vertigo   . Vitamin D deficiency     Current Outpatient Prescriptions  Medication Sig Dispense Refill  . aspirin EC 81 MG tablet Take 81 mg by mouth daily.    Marland Kitchen atorvastatin (LIPITOR) 10 MG tablet TAKE ONE TABLET BY MOUTH ONCE DAILY 90 tablet 1  . Calcium Carbonate-Vit D-Min (CALTRATE 600+D PLUS PO) Take 1 tablet by mouth 2 (two) times daily.      Marland Kitchen desipramine (NORPRAMIN) 10 MG tablet 1 tablet (10mg ) PO qHS for 2 weeks, then increase to 2 tabs PO qHS 90 tablet 3  . fluticasone (FLONASE) 50 MCG/ACT nasal spray Place 2 sprays into both nostrils daily as needed for allergies. 16 g 2  . hydrochlorothiazide (HYDRODIURIL) 25 MG tablet TAKE ONE TABLET BY MOUTH ONCE DAILY 90 tablet 0  . metoprolol succinate (TOPROL-XL) 50 MG 24 hr tablet TAKE ONE TABLET BY MOUTH ONCE DAILY. TAKE WITH OR IMMEDIATELY FOLLOWING A MEAL. 90 tablet 1  .  nitroGLYCERIN (NITROGLYN) 2 % ointment Apply 1 inch topically 4 (four) times daily as needed for chest pain.    Marland Kitchen omeprazole (PRILOSEC) 40 MG capsule Take 1 capsule (40 mg total) by mouth daily. 90 capsule 1  . ranitidine (ZANTAC) 150 MG tablet Take 1 tablet (150 mg total) by mouth 2 (two) times daily. 120 tablet 3  . valsartan (DIOVAN) 320 MG tablet TAKE ONE TABLET BY MOUTH ONCE DAILY 90 tablet 1   No current facility-administered medications for this visit.     No Known Allergies  Family History  Problem Relation Age of Onset  . Cancer Father     prostate cancer  . Hypertension Father   . Hypertension Sister   . Diabetes Brother   . Heart disease Brother     Deceased   . Hypertension Brother   . Diabetes Brother   . Heart disease Brother     Deceased    Social History   Social History  . Marital status: Married    Spouse name: N/A  . Number of children: N/A  . Years of education: N/A   Occupational History  . Not on file.   Social History Main Topics  . Smoking status: Never Smoker  . Smokeless tobacco: Never Used  . Alcohol use No  . Drug use:  Unknown  . Sexual activity: Not on file   Other Topics Concern  . 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        Constitutional: Denies fever, malaise, fatigue, headache or abrupt weight changes.  HEENT: Pt reports runny nose, sore throat. Denies eye pain, eye redness, ear pain, ringing in the ears, wax buildup, nasal congestion, bloody nose. Respiratory: Pt reports cough. Denies difficulty breathing, shortness of breath.   Cardiovascular: Denies chest pain, chest tightness, palpitations or swelling in the hands or feet.  Gastrointestinal:P t reports abdominal pain and nausea.Denies  bloating, constipation, diarrhea or blood in the stool.  GU: Denies urgency, frequency, pain with urination,  burning sensation, blood in urine, odor or discharge.  No other specific complaints in a complete review of systems (except as listed in HPI above).     Objective:   Physical Exam   BP 116/78   Pulse 65   Temp 97.8 F (36.6 C) (Oral)   Wt 157 lb (71.2 kg)   SpO2 98%   BMI 26.13 kg/m  Wt Readings from Last 3 Encounters:  06/02/16 157 lb (71.2 kg)  11/16/15 154 lb (69.9 kg)  10/20/15 151 lb (68.5 kg)    General: Appears her stated age, well developed, well nourished in NAD. HEENT: Head: normal shape and size. No sinus tenderness noted; Ears: Tm's gray and intact, retracted but normal light reflex; Nose: mucosa boggy and moist, septum midline; Throat/Mouth: Teeth present, mucosa pink and moist, + PND, no exudate, lesions or ulcerations noted.  Neck:  No adenopathy noted. Cardiovascular: Normal rate and rhythm.  Pulmonary/Chest: Normal effort and positive vesicular breath sounds. No respiratory distress. No wheezes, rales or ronchi noted.  Abdomen: Soft and mildly tender. Normal bowel sounds. No distention or masses noted.    BMET    Component Value Date/Time   NA 141 11/05/2015 1059   NA 138 07/31/2013 1727   K 4.2 11/05/2015 1059   K 3.7 07/31/2013 1727   CL 106 11/05/2015 1059   CL 105 07/31/2013 1727   CO2 29 11/05/2015 1059   CO2 31 07/31/2013 1727   GLUCOSE 98 11/05/2015 1059   GLUCOSE 141 (H) 07/31/2013 1727   BUN 23 11/05/2015 1059   BUN 15 07/31/2013 1727   CREATININE 0.90 11/05/2015 1059   CREATININE 0.96 07/31/2013 1727   CALCIUM 9.4 11/05/2015 1059   CALCIUM 9.0 07/31/2013 1727   GFRNONAA >60 11/15/2014 2234   GFRNONAA >60 07/31/2013 1727   GFRAA >60 11/15/2014 2234   GFRAA >60 07/31/2013 1727    Lipid Panel     Component Value Date/Time   CHOL 159 11/05/2015 1059   TRIG 78.0 11/05/2015 1059   HDL 43.60 11/05/2015 1059   CHOLHDL 4 11/05/2015 1059   VLDL 15.6 11/05/2015 1059   LDLCALC 100 (H) 11/05/2015 1059    CBC    Component Value  Date/Time   WBC 6.6 12/31/2014 1534   RBC 4.00 12/31/2014 1534   HGB 12.6 12/31/2014 1534   HGB 13.3 07/31/2013 1727   HCT 37.4 12/31/2014 1534   HCT 38.1 07/31/2013 1727   PLT 239.0 12/31/2014 1534   PLT 225 07/31/2013 1727   MCV 93.7 12/31/2014 1534   MCV 95 07/31/2013 1727   MCH 31.5 11/15/2014 2234   MCHC 33.6 12/31/2014 1534   RDW  16.5 (H) 12/31/2014 1534   RDW 14.0 07/31/2013 1727   LYMPHSABS 2.0 10/06/2014 0902   MONOABS 0.4 10/06/2014 0902   EOSABS 0.1 10/06/2014 0902   BASOSABS 0.0 10/06/2014 0902    Hgb A1C Lab Results  Component Value Date   HGBA1C 5.7 11/05/2015           Assessment & Plan:   Allergic Rhinitis:  Start Claritin and Flonase OTC Return precautions discussed  RTC as needed or if symptoms persist or worsen BAITY, REGINA, NP

## 2016-07-07 ENCOUNTER — Other Ambulatory Visit: Payer: Self-pay | Admitting: Primary Care

## 2016-07-07 DIAGNOSIS — I10 Essential (primary) hypertension: Secondary | ICD-10-CM

## 2016-07-14 ENCOUNTER — Other Ambulatory Visit: Payer: Self-pay | Admitting: Primary Care

## 2016-07-14 DIAGNOSIS — I10 Essential (primary) hypertension: Secondary | ICD-10-CM

## 2016-09-05 ENCOUNTER — Other Ambulatory Visit: Payer: Self-pay | Admitting: Primary Care

## 2016-10-10 ENCOUNTER — Other Ambulatory Visit: Payer: Self-pay | Admitting: Primary Care

## 2016-10-27 ENCOUNTER — Other Ambulatory Visit: Payer: Self-pay | Admitting: Primary Care

## 2016-10-27 DIAGNOSIS — K219 Gastro-esophageal reflux disease without esophagitis: Secondary | ICD-10-CM

## 2016-10-27 MED ORDER — OMEPRAZOLE 40 MG PO CPDR: 40.0000 mg | DELAYED_RELEASE_CAPSULE | Freq: Every day | ORAL | 1 refills | Status: DC

## 2016-10-31 ENCOUNTER — Other Ambulatory Visit: Payer: Self-pay | Admitting: Primary Care

## 2016-10-31 DIAGNOSIS — R7303 Prediabetes: Secondary | ICD-10-CM

## 2016-10-31 DIAGNOSIS — E559 Vitamin D deficiency, unspecified: Secondary | ICD-10-CM

## 2016-10-31 DIAGNOSIS — E785 Hyperlipidemia, unspecified: Secondary | ICD-10-CM

## 2016-11-08 ENCOUNTER — Other Ambulatory Visit (INDEPENDENT_AMBULATORY_CARE_PROVIDER_SITE_OTHER): Payer: BLUE CROSS/BLUE SHIELD

## 2016-11-08 ENCOUNTER — Encounter (INDEPENDENT_AMBULATORY_CARE_PROVIDER_SITE_OTHER): Payer: Self-pay

## 2016-11-08 DIAGNOSIS — E785 Hyperlipidemia, unspecified: Secondary | ICD-10-CM | POA: Diagnosis not present

## 2016-11-08 DIAGNOSIS — E559 Vitamin D deficiency, unspecified: Secondary | ICD-10-CM | POA: Diagnosis not present

## 2016-11-08 DIAGNOSIS — R7303 Prediabetes: Secondary | ICD-10-CM

## 2016-11-08 LAB — COMPREHENSIVE METABOLIC PANEL
ALT: 17 U/L (ref 0–35)
AST: 21 U/L (ref 0–37)
Albumin: 4.2 g/dL (ref 3.5–5.2)
Alkaline Phosphatase: 56 U/L (ref 39–117)
BUN: 19 mg/dL (ref 6–23)
CO2: 29 mEq/L (ref 19–32)
Calcium: 10 mg/dL (ref 8.4–10.5)
Chloride: 106 mEq/L (ref 96–112)
Creatinine, Ser: 0.93 mg/dL (ref 0.40–1.20)
GFR: 64.47 mL/min (ref >60.00)
Glucose, Bld: 111 mg/dL — ABNORMAL HIGH (ref 70–99)
Potassium: 4.4 mEq/L (ref 3.5–5.1)
Sodium: 140 mEq/L (ref 135–145)
Total Bilirubin: 0.6 mg/dL (ref 0.2–1.2)
Total Protein: 7.2 g/dL (ref 6.0–8.3)

## 2016-11-08 LAB — LIPID PANEL
Cholesterol: 151 mg/dL (ref 0–200)
HDL: 39.1 mg/dL (ref >39.00)
LDL Cholesterol: 98 mg/dL (ref 0–99)
NonHDL: 111.54
Total CHOL/HDL Ratio: 4
Triglycerides: 69 mg/dL (ref 0.0–149.0)
VLDL: 13.8 mg/dL (ref 0.0–40.0)

## 2016-11-08 LAB — HEMOGLOBIN A1C: Hgb A1c MFr Bld: 5.8 % (ref 4.6–6.5)

## 2016-11-08 LAB — VITAMIN D 25 HYDROXY (VIT D DEFICIENCY, FRACTURES): VITD: 18.4 ng/mL — ABNORMAL LOW (ref 30.00–100.00)

## 2016-11-10 ENCOUNTER — Telehealth: Payer: Self-pay

## 2016-11-10 NOTE — Telephone Encounter (Signed)
Pt request cb about lab results before seen on 11/16/16.

## 2016-11-10 NOTE — Telephone Encounter (Signed)
Please notify patient that everything is overall stable. Vitamin D is low. We will talk about these labs in greater detail during her upcoming visit.

## 2016-11-10 NOTE — Telephone Encounter (Signed)
Spoken and notified patient of Kate's comments. Patient verbalized understanding. 

## 2016-11-16 ENCOUNTER — Ambulatory Visit (INDEPENDENT_AMBULATORY_CARE_PROVIDER_SITE_OTHER): Payer: BLUE CROSS/BLUE SHIELD | Admitting: Primary Care

## 2016-11-16 ENCOUNTER — Encounter: Payer: Self-pay | Admitting: Primary Care

## 2016-11-16 VITALS — BP 116/78 | HR 82 | Temp 97.8°F | Ht 65.0 in | Wt 154.8 lb

## 2016-11-16 DIAGNOSIS — M8580 Other specified disorders of bone density and structure, unspecified site: Secondary | ICD-10-CM

## 2016-11-16 DIAGNOSIS — E785 Hyperlipidemia, unspecified: Secondary | ICD-10-CM | POA: Diagnosis not present

## 2016-11-16 DIAGNOSIS — H8110 Benign paroxysmal vertigo, unspecified ear: Secondary | ICD-10-CM | POA: Diagnosis not present

## 2016-11-16 DIAGNOSIS — R7303 Prediabetes: Secondary | ICD-10-CM

## 2016-11-16 DIAGNOSIS — J019 Acute sinusitis, unspecified: Secondary | ICD-10-CM | POA: Diagnosis not present

## 2016-11-16 DIAGNOSIS — I10 Essential (primary) hypertension: Secondary | ICD-10-CM

## 2016-11-16 DIAGNOSIS — K219 Gastro-esophageal reflux disease without esophagitis: Secondary | ICD-10-CM

## 2016-11-16 DIAGNOSIS — E559 Vitamin D deficiency, unspecified: Secondary | ICD-10-CM | POA: Diagnosis not present

## 2016-11-16 DIAGNOSIS — Z0000 Encounter for general adult medical examination without abnormal findings: Secondary | ICD-10-CM | POA: Diagnosis not present

## 2016-11-16 MED ORDER — AMOXICILLIN-POT CLAVULANATE 875-125 MG PO TABS: 1.0000 | ORAL_TABLET | Freq: Two times a day (BID) | ORAL | 0 refills | Status: DC

## 2016-11-16 NOTE — Patient Instructions (Signed)
Schedule your mammogram as discussed.  Start Augmentin antibiotics for sinus infection. Take 1 tablet by mouth twice daily for 10 days.  Please find out the date and type of pneumonia vaccination you completed.  Start exercising. You should be getting 150 minutes of moderate intensity exercise weekly.  It's important to improve your diet by reducing consumption of fast food, fried food, processed snack foods, sugary drinks. Increase consumption of fresh vegetables and fruits, whole grains, water.  Ensure you are drinking 64 ounces of water daily.  Follow up in 1 year for your annual exam or sooner if needed.  It was a pleasure to see you today!

## 2016-11-16 NOTE — Assessment & Plan Note (Signed)
Stable on current regimen. Continue HCTZ 25 mg, Toprol XL 50 mg, valsartan. BMP unremarkable.

## 2016-11-16 NOTE — Assessment & Plan Note (Signed)
Recent lipid panel stable. Continue atorvastatin. 

## 2016-11-16 NOTE — Assessment & Plan Note (Signed)
Overall stable, following with GI. Continue PPI and Zantac.

## 2016-11-16 NOTE — Assessment & Plan Note (Signed)
Bone density scan normal in 2016, repeat in 2019. Continue calcium and vitamin D.

## 2016-11-16 NOTE — Assessment & Plan Note (Signed)
Not taking vitamin D, discussed to start 2000 units daily.

## 2016-11-16 NOTE — Assessment & Plan Note (Signed)
Noted on recent labs. Discussed to start exercising, improving diet. Will continue to monitor.

## 2016-11-16 NOTE — Assessment & Plan Note (Signed)
Immunizations UTD. She will get the type and date of her pneumonia vaccination. Mammogram due, she will schedule. Colonoscopy UTD, due in 2021. Discussed the importance of a healthy diet and regular exercise in order for weight loss, and to reduce the risk of other medical problems. Exam overall stable. Labs with prediabetes. Follow up in 1 year for annual exam.

## 2016-11-16 NOTE — Progress Notes (Signed)
Subjective:    Patient ID: Valerie [name], female    DOB: [birth year], 66 y.o.   MRN: [id number]  HPI  Valerie [name] is a 66 year old female who presents today for complete physical.  1) Nasal Congestion: Also with scratchy throat, ear pain, cough. This has been present for the past 2 weeks. She's been taking OTC "pills" without any improvement. She's blowing thick, green mucous from her nasal cavity. Overall she's feeling worse.  Immunizations: -Tetanus: Completed in 2017 -Influenza: Completed last season -Pneumonia: Completed in 2017 at [facility]. Not sure which one. -Shingles: Completed in 2017  Diet: She endorses a fair diet. Breakfast: None Lunch: Crackers, Fast food, salad, grilled chicken Dinner: Vegetables (corn, green beans, tomatoes), [place] sandwich  Snacks: Graham crackers, yogurt Desserts: None Beverages: Water, sweet and un-sweet tea  Exercise: She is walking, does not regularly exercise Eye exam: Completed over 1 year ago Dental exam: Completes annually Colonoscopy: Completed in 2011, due in 2021 Dexa: Normal in 2016 Pap Smear: Hysterectomy Mammogram: Completed in 2017, negative. Plans on scheduling.  Hep C Screen: Negative in 2017   Review of Systems  Constitutional: Positive for fatigue. Negative for fever.  HENT: Positive for congestion and sore throat.   Respiratory: Positive for cough. Negative for shortness of breath.   Cardiovascular: Negative for chest pain.  Gastrointestinal: Negative for abdominal pain.       GERD symptoms overall controlled  Genitourinary: Negative for frequency.  Musculoskeletal: Negative for myalgias.  Allergic/Immunologic: Positive for environmental allergies.  Neurological: Negative for dizziness and headaches.  Psychiatric/Behavioral:       Stress at times, denies concerns for anxiety or depression       Past Medical History:  Diagnosis Date  . Acid reflux   . Adenocarcinoma of the endometrium/uterus ([place]) 12/04/2000  . Anal  fissure   . Atrophic vaginitis   . Esophagitis   . Hernia    Present to proximal to umbillical   . Hypertension   . Internal hemorrhoids   . Menorrhagia   . Osteopenia   . PONV (postoperative nausea and vomiting)   . Vertigo   . Vitamin D deficiency      Social History   Social History  . Marital status: Married    Spouse name: N/A  . Number of children: N/A  . Years of education: N/A   Occupational History  . Not on file.   Social History Main Topics  . Smoking status: Never Smoker  . Smokeless tobacco: Never Used  . Alcohol use No  . Drug use: Unknown  . Sexual activity: Not on file   Other Topics Concern  . 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       Past Surgical History:  Procedure Laterality Date  . [place] STUDY N/A 11/29/2015   Procedure: [place] STUDY;  Surgeon: Manus Gunning, MD;  Location: [facility];  Service: Gastroenterology;  Laterality: N/A;  . ABDOMINAL HYSTERECTOMY  12/04/2000   also bilateral salpingo-oophorectomy  . CARDIAC CATHETERIZATION  2006  . CARPAL TUNNEL RELEASE  11/20/2011   Procedure: CARPAL TUNNEL RELEASE;  Surgeon: Elaina Hoops, MD;  Location: [facility];  Service: Neurosurgery;  Laterality: Left;  LEFT carpal tunnel release  . CHOLECYSTECTOMY  1997  . DILATION AND CURETTAGE OF UTERUS  11/15/2000  . ESOPHAGEAL MANOMETRY N/A 09/20/2015   Procedure: ESOPHAGEAL MANOMETRY (EM);  Surgeon: Manus Gunning, MD;  Location: [facility];  Service: Gastroenterology;  Laterality: N/A;  . HYSTEROSCOPY  11/15/2000   D & C, resection of endometrial polyps  . OOPHORECTOMY  12/04/2000   bilateral salpingo-oophorectomy done with TAH  . TUBAL LIGATION      Family History  Problem Relation Age of Onset  . Cancer Father        prostate cancer  . Hypertension Father   . Hypertension Sister   .  Diabetes Brother   . Heart disease Brother        Deceased   . Hypertension Brother   . Diabetes Brother   . Heart disease Brother        Deceased    No Known Allergies  Current Outpatient Prescriptions on File Prior to Visit  Medication Sig Dispense Refill  . aspirin EC 81 MG tablet Take 81 mg by mouth daily.    Marland Kitchen atorvastatin (LIPITOR) 10 MG tablet TAKE ONE TABLET BY MOUTH ONCE DAILY 90 tablet 0  . Calcium Carbonate-Vit D-Min (CALTRATE 600+D PLUS PO) Take 1 tablet by mouth 2 (two) times daily.      Marland Kitchen desipramine (NORPRAMIN) 10 MG tablet 1 tablet (10mg ) PO qHS for 2 weeks, then increase to 2 tabs PO qHS 90 tablet 3  . fluticasone (FLONASE) 50 MCG/ACT nasal spray Place 2 sprays into both nostrils daily as needed for allergies. 16 g 2  . hydrochlorothiazide (HYDRODIURIL) 25 MG tablet TAKE ONE TABLET BY MOUTH ONCE DAILY 90 tablet 1  . metoprolol succinate (TOPROL-XL) 50 MG 24 hr tablet TAKE ONE TABLET BY MOUTH ONCE DAILY TAKE  WITH  OR  IMMEDIATELY  FOLLOWING  A  MEAL 90 tablet 1  . nitroGLYCERIN (NITROGLYN) 2 % ointment Apply 1 inch topically 4 (four) times daily as needed for chest pain.    Marland Kitchen omeprazole (PRILOSEC) 40 MG capsule Take 1 capsule (40 mg total) by mouth daily. 90 capsule 1  . ranitidine (ZANTAC) 150 MG tablet Take 1 tablet (150 mg total) by mouth 2 (two) times daily. 120 tablet 3  . valsartan (DIOVAN) 320 MG tablet TAKE ONE TABLET BY MOUTH ONCE DAILY 90 tablet 1   No current facility-administered medications on file prior to visit.     BP 116/78   Pulse 82   Temp 97.8 F (36.6 C) (Oral)   Ht 5\' 5"  (1.651 m)   Wt 154 lb 12.8 oz (70.2 kg)   SpO2 97%   BMI 25.76 kg/m    Objective:   Physical Exam  Constitutional: She is oriented to person, place, and time. She appears well-nourished.  HENT:  Right Ear: Tympanic membrane and ear canal normal.  Left Ear: Tympanic membrane and ear canal normal.  Nose: Mucosal edema present. Right sinus exhibits frontal sinus  tenderness. Left sinus exhibits frontal sinus tenderness.  Mouth/Throat: Oropharynx is clear and moist.  Eyes: Conjunctivae and EOM are normal. Pupils are equal, round, and reactive to light.  Neck: Neck supple. No thyromegaly present.  Cardiovascular: Normal rate and regular rhythm.   No murmur heard. Pulmonary/Chest: Effort normal and breath sounds normal. She has no rales.  Abdominal: Soft. Bowel sounds are normal. There is no tenderness.  Musculoskeletal: Normal range of motion.  Lymphadenopathy:    She has no cervical adenopathy.  Neurological: She is alert and oriented to person, place, and time. She has normal reflexes. No cranial nerve  deficit.  Skin: Skin is warm and dry. No rash noted.  Psychiatric: She has a normal mood and affect.          Assessment & Plan:  Acute Sinusitis:  Nasal congestion, sinus pressure, fatigue x 2+ weeks. Exam today with frontal sinus tenderness. Does appear unwell, not sickly. Given duration of symptoms and presentation, will treat. Rx for Augmentin course sent to pharmacy. Flonase, tylenol, rest. Follow up PRN.  Sheral Flow, NP

## 2016-11-16 NOTE — Assessment & Plan Note (Signed)
Overall stable, infrequent episodes. Continue Flonase.

## 2016-12-05 ENCOUNTER — Ambulatory Visit: Payer: Self-pay | Admitting: Obstetrics and Gynecology

## 2016-12-12 ENCOUNTER — Other Ambulatory Visit: Payer: Self-pay | Admitting: Primary Care

## 2016-12-12 DIAGNOSIS — Z1231 Encounter for screening mammogram for malignant neoplasm of breast: Secondary | ICD-10-CM

## 2016-12-14 ENCOUNTER — Ambulatory Visit
Admission: RE | Admit: 2016-12-14 | Discharge: 2016-12-14 | Disposition: A | Discharge status: Home / Self Care | Payer: BLUE CROSS/BLUE SHIELD | Source: Ambulatory Visit | Attending: Primary Care | Admitting: Primary Care

## 2016-12-14 DIAGNOSIS — Z1231 Encounter for screening mammogram for malignant neoplasm of breast: Secondary | ICD-10-CM | POA: Diagnosis present

## 2016-12-15 ENCOUNTER — Other Ambulatory Visit: Payer: Self-pay | Admitting: *Deleted

## 2016-12-15 ENCOUNTER — Inpatient Hospital Stay
Admission: RE | Admit: 2016-12-15 | Discharge: 2016-12-15 | Disposition: A | Discharge status: Home / Self Care | Payer: Self-pay | Source: Ambulatory Visit | Attending: *Deleted | Admitting: *Deleted

## 2016-12-15 DIAGNOSIS — Z9289 Personal history of other medical treatment: Secondary | ICD-10-CM

## 2016-12-26 ENCOUNTER — Ambulatory Visit (INDEPENDENT_AMBULATORY_CARE_PROVIDER_SITE_OTHER): Payer: Self-pay | Admitting: Obstetrics and Gynecology

## 2016-12-26 ENCOUNTER — Encounter: Payer: Self-pay | Admitting: Obstetrics and Gynecology

## 2016-12-26 VITALS — BP 124/78 | Ht 65.0 in | Wt 158.0 lb

## 2016-12-26 DIAGNOSIS — Z1339 Encounter for screening examination for other mental health and behavioral disorders: Secondary | ICD-10-CM

## 2016-12-26 DIAGNOSIS — Z1389 Encounter for screening for other disorder: Secondary | ICD-10-CM

## 2016-12-26 DIAGNOSIS — Z124 Encounter for screening for malignant neoplasm of cervix: Secondary | ICD-10-CM

## 2016-12-26 DIAGNOSIS — Z1331 Encounter for screening for depression: Secondary | ICD-10-CM

## 2016-12-26 DIAGNOSIS — Z01419 Encounter for gynecological examination (general) (routine) without abnormal findings: Secondary | ICD-10-CM

## 2016-12-26 NOTE — Patient Instructions (Signed)
For barrier protection: Aquaphor twice daily If you must use soap: Cetaphil

## 2016-12-26 NOTE — Progress Notes (Signed)
Routine Annual Gynecology Examination   PCP: Pleas Koch, NP  Chief Complaint  Patient presents with  . Annual Exam    History of Present Illness: Patient is a 66 y.o. [id number] presents for annual exam. The patient has no complaints today.   Menopausal bleeding: denies  (She is status post TAH/BSO for reported uterine cancer in 2002)  Menopausal symptoms: denies  Breast symptoms: denies  Last pap smear: 1 years ago.  Result Normal  Last mammogram: 2 weeks ago.  Result Normal  Past Medical History:  Diagnosis Date  . Acid reflux   . Adenocarcinoma of the endometrium/uterus ([place]) 12/04/2000  . Anal fissure   . Atrophic vaginitis   . Esophagitis   . Hernia    Present to proximal to umbillical   . Hypertension   . Internal hemorrhoids   . Menorrhagia   . Osteopenia   . PONV (postoperative nausea and vomiting)   . Vertigo   . Vitamin D deficiency     Past Surgical History:  Procedure Laterality Date  . [place] STUDY N/A 11/29/2015   Procedure: [place] STUDY;  Surgeon: Manus Gunning, MD;  Location: [facility];  Service: Gastroenterology;  Laterality: N/A;  . ABDOMINAL HYSTERECTOMY  12/04/2000   also bilateral salpingo-oophorectomy  . CARDIAC CATHETERIZATION  2006  . CARPAL TUNNEL RELEASE  11/20/2011   Procedure: CARPAL TUNNEL RELEASE;  Surgeon: Elaina Hoops, MD;  Location: [facility];  Service: Neurosurgery;  Laterality: Left;  LEFT carpal tunnel release  . CHOLECYSTECTOMY  1997  . DILATION AND CURETTAGE OF UTERUS     11/15/2000  . ESOPHAGEAL MANOMETRY N/A 09/20/2015   Procedure: ESOPHAGEAL MANOMETRY (EM);  Surgeon: Manus Gunning, MD;  Location: [facility];  Service: Gastroenterology;  Laterality: N/A;  . HYSTEROSCOPY  11/15/2000   D & C, resection of endometrial polyps  . OOPHORECTOMY  12/04/2000   bilateral salpingo-oophorectomy done with TAH  . TUBAL LIGATION      Prior to Admission medications   Medication Sig Start Date  End Date Taking? Authorizing Provider  aspirin EC 81 MG tablet Take 81 mg by mouth daily.   Yes [provider]  atorvastatin (LIPITOR) 10 MG tablet TAKE 1 TABLET BY MOUTH ONCE DAILY 12/12/16  Yes Pleas Koch, NP  Calcium Carbonate-Vit D-Min (CALTRATE 600+D PLUS PO) Take 1 tablet by mouth 2 (two) times daily.     Yes [provider]  desipramine (NORPRAMIN) 10 MG tablet 1 tablet (10mg ) PO qHS for 2 weeks, then increase to 2 tabs PO qHS 12/06/15 12/26/16 Yes Armbruster, Renelda Loma, MD  fluticasone [facility]) 50 MCG/ACT nasal spray Place 2 sprays into both nostrils daily as needed for allergies. 06/02/16  Yes Jearld Fenton, NP  hydrochlorothiazide (HYDRODIURIL) 25 MG tablet TAKE ONE TABLET BY MOUTH ONCE DAILY 07/14/16  Yes Pleas Koch, NP  metoprolol succinate (TOPROL-XL) 50 MG 24 hr tablet TAKE ONE TABLET BY MOUTH ONCE DAILY TAKE  WITH  OR  IMMEDIATELY  FOLLOWING  A  MEAL 10/10/16  Yes Pleas Koch, NP  nitroGLYCERIN (NITROGLYN) 2 % ointment Apply 1 inch topically 4 (four) times daily as needed for chest pain.   Yes [provider]  omeprazole (PRILOSEC) 40 MG capsule Take 1 capsule (40 mg total) by mouth daily. 10/27/16  Yes Pleas Koch, NP  ranitidine (ZANTAC) 150 MG tablet Take 1 tablet (150 mg total) by mouth 2 (two) times daily. 08/24/15  Yes  Manus Gunning, MD  valsartan (DIOVAN) 320 MG tablet TAKE ONE TABLET BY MOUTH ONCE DAILY 07/07/16  Yes Pleas Koch, NP  amoxicillin-clavulanate (AUGMENTIN) 875-125 MG tablet Take 1 tablet by mouth 2 (two) times daily. Patient not taking: Reported on 12/26/2016 11/16/16   Pleas Koch, NP   Allergies: No Known Allergies  Obstetric History: [id number]  Social History   Social History  . Marital status: Married    Spouse name: N/A  . Number of children: N/A  . Years of education: N/A   Occupational History  . Not on file.   Social History Main Topics  . Smoking status: Never Smoker  .  Smokeless tobacco: Never Used  . Alcohol use No  . Drug use: Unknown  . Sexual activity: Yes    Birth control/ protection: Post-menopausal   Other Topics Concern  . 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       Family History  Problem Relation Age of Onset  . Cancer Father        prostate cancer  . Hypertension Father   . Hypertension Sister   . Diabetes Brother   . Heart disease Brother        Deceased   . Hypertension Brother   . Diabetes Brother   . Heart disease Brother        Deceased  . Breast cancer Neg Hx     Review of Systems  Constitutional: Negative.   HENT: Negative.   Eyes: Negative.   Respiratory: Negative.   Cardiovascular: Negative.   Gastrointestinal: Negative.   Genitourinary: Negative.   Musculoskeletal: Negative.   Skin: Negative.   Neurological: Negative.   Psychiatric/Behavioral: Negative.      Physical Exam Vitals: BP 124/78   Ht 5\' 5"  (1.651 m)   Wt 158 lb (71.7 kg)   BMI 26.29 kg/m   Physical Exam  Constitutional: She is oriented to person, place, and time. She appears well-developed and well-nourished. No distress.  Genitourinary: Pelvic exam was performed with patient supine.  There is rash on the right labia. There is no tenderness, lesion or injury on the right labia.  There is rash on the left labia. There is no tenderness, lesion or injury on the left labia.    No erythema, tenderness or bleeding in the vagina. No foreign body in the vagina. No signs of injury around the vagina. No vaginal discharge found. Right adnexum does not display mass, does not display tenderness and does not display fullness. Left adnexum does not display mass, does not display tenderness and does not display fullness.  Genitourinary Comments: Cervix and uterus surgically absent. No lesions noted  HENT:  Head: Normocephalic and  atraumatic.  Eyes: EOM are normal. No scleral icterus.  Neck: Normal range of motion. Neck supple. No thyromegaly present.  Cardiovascular: Normal rate and regular rhythm.  Exam reveals no gallop and no friction rub.   No murmur heard. Pulmonary/Chest: Effort normal and breath sounds normal. No respiratory distress. She has no wheezes. She has no rales. Right breast exhibits no inverted nipple, no mass, no nipple discharge, no skin change and no tenderness. Left breast exhibits no inverted nipple, no mass, no nipple discharge, no skin change and no tenderness.  Abdominal: Soft. Bowel sounds are normal. She exhibits no distension and no mass. There  is no tenderness. There is no rebound and no guarding.  Musculoskeletal: Normal range of motion. She exhibits no edema or tenderness.  Lymphadenopathy:    She has no cervical adenopathy.       Right: No inguinal adenopathy present.       Left: No inguinal adenopathy present.  Neurological: She is alert and oriented to person, place, and time. No cranial nerve deficit.  Skin: Skin is warm and dry. No rash noted. No erythema.  Psychiatric: She has a normal mood and affect. Her behavior is normal. Judgment normal.    Female chaperone present for pelvic and breast  portions of the physical exam  Results: AUDIT Questionnaire (screen for alcoholism): 0  Depression screen [facility] 2/9 12/26/2016  Decreased Interest 0  Down, Depressed, Hopeless 0  PHQ - 2 Score 0  Altered sleeping 0  Tired, decreased energy 0  Change in appetite 0  Feeling bad or failure about yourself  0  Trouble concentrating 0  Moving slowly or fidgety/restless 0  Suicidal thoughts 0  PHQ-9 Score 0    Assessment and Plan:  66 y.o. [id number] female here for routine annual gynecologic examination  Plan: Problem List Items Addressed This Visit    None      Screening: -- Blood pressure screen managed by PCP -- Colonoscopy - per PCP -- Mammogram - done a couple of weeks ago per  patient. Normal.  -- Weight screening: overweight: continue to monitor -- Depression screening negative (PHQ-9) -- Nutrition: normal -- cholesterol screening: per PCP -- osteoporosis screening: not due -- tobacco screening: not using -- alcohol screening: AUDIT questionnaire indicates low-risk usage. -- family history of breast cancer screening: done. not at high risk. -- no evidence of domestic violence or intimate partner violence. -- STD screening: gonorrhea/chlamydia NAAT not collected per patient request. -- pap smear collected per ASCCP guidelines -- HPV vaccination series: not eligilbe  Vulvar lesion: appears to be allergic dermatitis.  Will continue to monitor.  Discussed conservative methods for treating. Will start with these.    Prentice Docker, MD 12/26/2016 11:12 AM

## 2016-12-28 ENCOUNTER — Telehealth: Payer: Self-pay | Admitting: *Deleted

## 2016-12-28 NOTE — Telephone Encounter (Addendum)
Please notify patient that valsartan is on the recall list as it may contain hazardous chemicals. I like to switch her to losartan 50 mg tablets for blood pressure. Take 1 tablet by mouth once daily. Is she agreeable? If so then please schedule her for a follow-up office visit to recheck blood pressure in 2-3 weeks.

## 2016-12-28 NOTE — Telephone Encounter (Signed)
Pt left v/m; pt received notice that valsartan has been recalled; pt request cb with substitute pt can take for the valsartan.

## 2016-12-28 NOTE — Telephone Encounter (Signed)
Faxed refill request. Valsartan Please send in new drug.  This drug is on recall.

## 2016-12-29 ENCOUNTER — Other Ambulatory Visit: Payer: Self-pay

## 2016-12-29 ENCOUNTER — Telehealth: Payer: Self-pay | Admitting: Primary Care

## 2016-12-29 DIAGNOSIS — I10 Essential (primary) hypertension: Secondary | ICD-10-CM

## 2016-12-29 LAB — PAP IG (IMAGE GUIDED): PAP Smear Comment: 0

## 2016-12-29 MED ORDER — LOSARTAN POTASSIUM 50 MG PO TABS: 50.0000 mg | ORAL_TABLET | Freq: Every day | ORAL | 0 refills | Status: DC

## 2016-12-29 NOTE — Telephone Encounter (Signed)
She was supposed to call back to schedule an appt in 2-3 weeks. I already spoke with her about her meds.

## 2016-12-29 NOTE — Telephone Encounter (Signed)
She would like to switch to losartan she uses [facility] on [address]. She said she will call back to schedule an appt.

## 2016-12-29 NOTE — Telephone Encounter (Signed)
Caller:Patient  Relationship to patient:self  Best [phone] Pharmacy:[facility]  Reason for call: Patient is asking for a call back about her rx.

## 2016-12-29 NOTE — Telephone Encounter (Signed)
Which medication did she call about?

## 2016-12-29 NOTE — Telephone Encounter (Signed)
I'm sorry I thought I attached this to the note.  The phone note is dated 12/28/16 about Valsartan.

## 2016-12-29 NOTE — Telephone Encounter (Signed)
Pt returned your call she has ? About meds Best number [phone]

## 2016-12-29 NOTE — Telephone Encounter (Signed)
Noted Rx for Losartan 50 mg sent to pharmacy. Please schedule her for follow up in 2-3 weeks. Stop valsartan.

## 2017-01-01 NOTE — Telephone Encounter (Signed)
Pt left v/m; the losartan is not effective; pts BP was 165/? And heart rate goes up. Pt started Losartan 50 mg taking one daily on 12/29/16. Last night BP was 160/90 P 101. No dizziness, h/a,CP or SOB. This morning BP was 158/83 and P 87. [facility] [address].Pt request cb.

## 2017-01-01 NOTE — Telephone Encounter (Signed)
Please have patient start taking two losartan tablets once daily. Have her continue to monitor BP and update me on Friday this week. I still need to see her in the office in 2-3 weeks for BP check.

## 2017-01-01 NOTE — Telephone Encounter (Signed)
Per DPR, left detail message of Kate's comments for patient to call back. 

## 2017-01-04 ENCOUNTER — Encounter: Payer: Self-pay | Admitting: Obstetrics and Gynecology

## 2017-01-05 NOTE — Telephone Encounter (Signed)
Called patient and was able to schedule the follow up on 01/23/2017

## 2017-01-22 ENCOUNTER — Ambulatory Visit (INDEPENDENT_AMBULATORY_CARE_PROVIDER_SITE_OTHER): Payer: BLUE CROSS/BLUE SHIELD | Admitting: Primary Care

## 2017-01-22 ENCOUNTER — Encounter: Payer: Self-pay | Admitting: Primary Care

## 2017-01-22 ENCOUNTER — Ambulatory Visit (INDEPENDENT_AMBULATORY_CARE_PROVIDER_SITE_OTHER)
Admission: RE | Admit: 2017-01-22 | Discharge: 2017-01-22 | Disposition: A | Discharge status: Home / Self Care | Payer: BLUE CROSS/BLUE SHIELD | Source: Ambulatory Visit | Attending: Primary Care | Admitting: Primary Care

## 2017-01-22 VITALS — BP 118/78 | HR 67 | Temp 97.7°F | Ht 65.0 in | Wt 154.1 lb

## 2017-01-22 DIAGNOSIS — M542 Cervicalgia: Secondary | ICD-10-CM

## 2017-01-22 DIAGNOSIS — I10 Essential (primary) hypertension: Secondary | ICD-10-CM | POA: Diagnosis not present

## 2017-01-22 DIAGNOSIS — Z9889 Other specified postprocedural states: Secondary | ICD-10-CM

## 2017-01-22 MED ORDER — TIZANIDINE HCL 4 MG PO TABS: 4.0000 mg | ORAL_TABLET | Freq: Three times a day (TID) | ORAL | 0 refills | Status: DC | PRN

## 2017-01-22 MED ORDER — LOSARTAN POTASSIUM 50 MG PO TABS: 50.0000 mg | ORAL_TABLET | Freq: Every day | ORAL | 3 refills | Status: DC

## 2017-01-22 NOTE — Progress Notes (Signed)
Subjective:    Patient ID: Valerie [name], female    DOB: [birth year], 66 y.o.   MRN: [id number]  HPI  Valerie [name] is a 66 year old female who presents today for follow up of hypertension. She was previously managed on valsartan which was recently recalled. She was switched to losartan 50 mg and continued her metoprolol succinate 50 mg and HCTZ 25 mg.   Her BP in the office today is 118/78. She's not taking her HCTZ and has not taken this for the past 2-3 weeks. She's checking her BP at home which runs 114's/70's. She denies chest pain, shortness of breath, visual changes, fatigue.  2) Neck Pain: Located to the middle of her cervical spine that has been present for years (history of cervical spinal surgery in 2013), worse over the past several months. She does a lot of lifting above her head at work (30-50 pounds). She denies radiation of pain, numbness/tingling, trauma. She's been taking Ibuprofen/Aleve without much improvement   Review of Systems  Eyes: Negative for visual disturbance.  Respiratory: Negative for shortness of breath.   Cardiovascular: Negative for chest pain.  Musculoskeletal: Positive for neck pain and neck stiffness.  Neurological: Negative for weakness and numbness.       Past Medical History:  Diagnosis Date  . Acid reflux   . Adenocarcinoma of the endometrium/uterus ([place]) 12/04/2000  . Anal fissure   . Atrophic vaginitis   . Esophagitis   . Hernia    Present to proximal to umbillical   . Hypertension   . Internal hemorrhoids   . Menorrhagia   . Osteopenia   . PONV (postoperative nausea and vomiting)   . Vertigo   . Vitamin D deficiency      Social History   Social History  . Marital status: Married    Spouse name: N/A  . Number of children: N/A  . Years of education: N/A   Occupational History  . Not on file.   Social History Main Topics  . Smoking status: Never Smoker  . Smokeless tobacco: Never Used  . Alcohol use No  . Drug use: Unknown  .  Sexual activity: Yes    Birth control/ protection: Post-menopausal   Other Topics Concern  . 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       Past Surgical History:  Procedure Laterality Date  . [place] STUDY N/A 11/29/2015   Procedure: [place] STUDY;  Surgeon: Manus Gunning, MD;  Location: [facility];  Service: Gastroenterology;  Laterality: N/A;  . ABDOMINAL HYSTERECTOMY  12/04/2000   also bilateral salpingo-oophorectomy  . CARDIAC CATHETERIZATION  2006  . CARPAL TUNNEL RELEASE  11/20/2011   Procedure: CARPAL TUNNEL RELEASE;  Surgeon: Elaina Hoops, MD;  Location: [facility];  Service: Neurosurgery;  Laterality: Left;  LEFT carpal tunnel release  . CERVICAL SPINE SURGERY  2013  . CHOLECYSTECTOMY  1997  . DILATION AND CURETTAGE OF UTERUS     11/15/2000  . ESOPHAGEAL MANOMETRY N/A 09/20/2015   Procedure: ESOPHAGEAL MANOMETRY (EM);  Surgeon: Manus Gunning, MD;  [facility]: [facility];  Service: Gastroenterology;  Laterality: N/A;  . HYSTEROSCOPY  11/15/2000   D & C, resection of endometrial polyps  . OOPHORECTOMY  12/04/2000   bilateral salpingo-oophorectomy done with TAH  . TUBAL  LIGATION      Family History  Problem Relation Age of Onset  . Cancer Father        prostate cancer  . Hypertension Father   . Hypertension Sister   . Diabetes Brother   . Heart disease Brother        Deceased   . Hypertension Brother   . Diabetes Brother   . Heart disease Brother        Deceased  . Breast cancer Neg Hx     No Known Allergies  Current Outpatient Prescriptions on File Prior to Visit  Medication Sig Dispense Refill  . aspirin EC 81 MG tablet Take 81 mg by mouth daily.    Marland Kitchen atorvastatin (LIPITOR) 10 MG tablet TAKE 1 TABLET BY MOUTH ONCE DAILY 90 tablet 3  . Calcium Carbonate-Vit D-Min (CALTRATE 600+D PLUS PO) Take 1 tablet by mouth 2  (two) times daily.      Marland Kitchen desipramine (NORPRAMIN) 10 MG tablet 1 tablet (10mg ) PO qHS for 2 weeks, then increase to 2 tabs PO qHS 90 tablet 3  . fluticasone (FLONASE) 50 MCG/ACT nasal spray Place 2 sprays into both nostrils daily as needed for allergies. 16 g 2  . metoprolol succinate (TOPROL-XL) 50 MG 24 hr tablet TAKE ONE TABLET BY MOUTH ONCE DAILY TAKE  WITH  OR  IMMEDIATELY  FOLLOWING  A  MEAL 90 tablet 1  . nitroGLYCERIN (NITROGLYN) 2 % ointment Apply 1 inch topically 4 (four) times daily as needed for chest pain.    Marland Kitchen omeprazole (PRILOSEC) 40 MG capsule Take 1 capsule (40 mg total) by mouth daily. 90 capsule 1  . ranitidine (ZANTAC) 150 MG tablet Take 1 tablet (150 mg total) by mouth 2 (two) times daily. 120 tablet 3   No current facility-administered medications on file prior to visit.     BP 118/78   Pulse 67   Temp 97.7 F (36.5 C) (Oral)   Ht 5\' 5"  (1.651 m)   Wt 154 lb 1.9 oz (69.9 kg)   SpO2 97%   BMI 25.65 kg/m    Objective:   Physical Exam  Constitutional: She appears well-nourished.  Neck: Neck supple.  Cardiovascular: Normal rate and regular rhythm.   Pulmonary/Chest: Effort normal and breath sounds normal.  Musculoskeletal:       Cervical back: She exhibits decreased range of motion, tenderness, pain and spasm.  Tender to C4-6 area. Decrease in ROM with right lateral rotation.  Skin: Skin is warm and dry.          Assessment & Plan:

## 2017-01-22 NOTE — Patient Instructions (Signed)
Continue losartan 50 mg and metoprolol succinate 50 mg for high blood pressure.  Complete xray(s) prior to leaving today. I will notify you of your results once received.  You can take tizanidine every 8 hours as needed for neck pain/spams. Caution as this may cause drowsiness.   Try Meclizine 25 mg as needed for vertigo. Start with 1/2 tablet. This may be purchased over the counter.  It was a pleasure to see you today!

## 2017-01-22 NOTE — Assessment & Plan Note (Signed)
Stable on losartan and metoprolol, continue same. Will d/c HCTZ as she is doing well without. She will continue to monitor BP at home.

## 2017-01-22 NOTE — Assessment & Plan Note (Signed)
Acute on Chronic pain to mid neck. History of cervical surgical intervention in 2013. Given history of surgery, coupled with increased pain over past 2-3 months, will obtain plain films. Rx for tizanidine sent to pharmacy PRN, drowsiness precautions provided.

## 2017-01-23 ENCOUNTER — Ambulatory Visit: Payer: BLUE CROSS/BLUE SHIELD | Admitting: Primary Care

## 2017-04-04 ENCOUNTER — Other Ambulatory Visit: Payer: Self-pay | Admitting: Primary Care

## 2017-04-04 DIAGNOSIS — K219 Gastro-esophageal reflux disease without esophagitis: Secondary | ICD-10-CM

## 2017-04-21 ENCOUNTER — Other Ambulatory Visit: Payer: Self-pay | Admitting: Primary Care

## 2017-05-03 ENCOUNTER — Telehealth: Payer: Self-pay | Admitting: Primary Care

## 2017-05-03 NOTE — Telephone Encounter (Signed)
Noted  

## 2017-05-03 NOTE — Telephone Encounter (Signed)
Copied from [place]. Topic: Quick Communication - See Telephone Encounter >> May 03, 2017 11:40 AM Cleaster Corin, NT wrote: CRM for notification. See Telephone encounter for:  05/03/17.

## 2017-05-03 NOTE — Telephone Encounter (Signed)
CRM for notification. See Telephone encounter for:   05/03/17. Pt. Will be having knee surgery just wanted to let Dr. Carlis Abbott know.

## 2017-05-04 ENCOUNTER — Telehealth: Payer: Self-pay | Admitting: Primary Care

## 2017-05-04 NOTE — Telephone Encounter (Signed)
Spoken and notified patient of Kate's comments. Patient verbalized understanding. Patient's surgery date in 05/11/2017.  Allie Bossier will be out of the office on 05/09/2017 to 05/15/2017.  Patient will call on Tuesday and see what we can to get her in on Tuesday per [name] OR may see another provider if Tuesday does not work for patient.

## 2017-05-04 NOTE — Telephone Encounter (Signed)
Please notify patient that she'll need an appointment to come in for surgical clearance. 30 min office visit please.

## 2017-05-09 ENCOUNTER — Telehealth: Payer: Self-pay | Admitting: Primary Care

## 2017-05-09 NOTE — Telephone Encounter (Signed)
Copied from [place]. Topic: Quick Communication - Office Called Patient >> May 09, 2017  1:41 PM Valerie [name], [place] wrote: Reason for CRM Please call again patient missed call

## 2017-05-09 NOTE — Telephone Encounter (Signed)
Spoken to patient. I have notified her, Valerie [name] is out of the office until next week. Other providers in the office does not have 30 mins slot available. Patient wants to discuss with ortho and check with them to see what can do.

## 2017-05-13 ENCOUNTER — Other Ambulatory Visit: Payer: Self-pay | Admitting: Primary Care

## 2017-05-13 DIAGNOSIS — I10 Essential (primary) hypertension: Secondary | ICD-10-CM

## 2017-05-14 NOTE — Telephone Encounter (Signed)
Ok to refill? Electronically refill request for hydrochlorothiazide (HYDRODIURIL) 25 MG tablet  This was taken off from the last office visit on 01/22/2017. Next office visit on 05/17/2017

## 2017-05-17 ENCOUNTER — Ambulatory Visit (INDEPENDENT_AMBULATORY_CARE_PROVIDER_SITE_OTHER): Payer: BLUE CROSS/BLUE SHIELD | Admitting: Primary Care

## 2017-05-17 ENCOUNTER — Encounter: Payer: Self-pay | Admitting: Primary Care

## 2017-05-17 VITALS — BP 132/80 | HR 76 | Temp 98.1°F | Ht 65.0 in | Wt 158.0 lb

## 2017-05-17 DIAGNOSIS — R7303 Prediabetes: Secondary | ICD-10-CM

## 2017-05-17 DIAGNOSIS — I10 Essential (primary) hypertension: Secondary | ICD-10-CM

## 2017-05-17 DIAGNOSIS — Z01818 Encounter for other preprocedural examination: Secondary | ICD-10-CM

## 2017-05-17 LAB — COMPREHENSIVE METABOLIC PANEL
ALT: 17 U/L (ref 0–35)
AST: 19 U/L (ref 0–37)
Albumin: 3.7 g/dL (ref 3.5–5.2)
Alkaline Phosphatase: 40 U/L (ref 39–117)
BUN: 16 mg/dL (ref 6–23)
CO2: 29 mEq/L (ref 19–32)
Calcium: 8.7 mg/dL (ref 8.4–10.5)
Chloride: 109 mEq/L (ref 96–112)
Creatinine, Ser: 0.83 mg/dL (ref 0.40–1.20)
GFR: 73.39 mL/min (ref >60.00)
Glucose, Bld: 117 mg/dL — ABNORMAL HIGH (ref 70–99)
Potassium: 4.6 mEq/L (ref 3.5–5.1)
Sodium: 142 mEq/L (ref 135–145)
Total Bilirubin: 0.8 mg/dL (ref 0.2–1.2)
Total Protein: 6.7 g/dL (ref 6.0–8.3)

## 2017-05-17 LAB — CBC
HCT: 38.6 % (ref 36.0–46.0)
Hemoglobin: 12.8 g/dL (ref 12.0–15.0)
MCHC: 33.1 g/dL (ref 30.0–36.0)
MCV: 96.3 fl (ref 78.0–100.0)
Platelets: 231 10*3/uL (ref 150.0–400.0)
RBC: 4.01 Mil/uL (ref 3.87–5.11)
RDW: 14.9 % (ref 11.5–15.5)
WBC: 4.3 10*3/uL (ref 4.0–10.5)

## 2017-05-17 LAB — HEMOGLOBIN A1C: Hgb A1c MFr Bld: 5.8 % (ref 4.6–6.5)

## 2017-05-17 MED ORDER — LOSARTAN POTASSIUM 100 MG PO TABS: 100.0000 mg | ORAL_TABLET | Freq: Every day | ORAL | 3 refills | Status: DC

## 2017-05-17 NOTE — Assessment & Plan Note (Signed)
HCTZ 25 mg recalled. Increase losartan to 100 mg once daily, continue Toprol XL 50 mg once daily. Will have her continue to monitor BP and report readings at or above 140/90.

## 2017-05-17 NOTE — Progress Notes (Signed)
Subjective:    Patient ID: Valerie [name], female    DOB: 22-Dec-1950, 66 y.o.   MRN: [id number]  HPI  Valerie [name] is a 66 year old female who presents today for surgical clearance. She will undergo left knee scope for torn cartilage from a fall in 2017 at work.   Her surgery is scheduled for January 9th, 2019. She denies chest pain, dizziness, visual changes, abdominal pain, unexplained weight loss.   She's been taking Losartan 100 mg for the past one week with home BP readings of 409 systolic. Her hydrochlorothiazide 25 mg was recalled about one month ago. Without HCTZ she noticed an increase in BP to 811-914'N systolic with headaches. Overall she's starting to feel better since the increase in losartan.  Review of Systems  Constitutional: Negative for unexpected weight change.  Eyes: Negative for visual disturbance.  Respiratory: Negative for shortness of breath.   Cardiovascular: Negative for chest pain.  Gastrointestinal: Negative for constipation and diarrhea.  Skin: Negative for color change.  Neurological: Negative for dizziness and numbness.       Past Medical History:  Diagnosis Date  . Acid reflux   . Adenocarcinoma of the endometrium/uterus ([place]) 12/04/2000  . Anal fissure   . Atrophic vaginitis   . Esophagitis   . Hernia    Present to proximal to umbillical   . Hypertension   . Internal hemorrhoids   . Menorrhagia   . Osteopenia   . PONV (postoperative nausea and vomiting)   . Vertigo   . Vitamin D deficiency      Social History   Socioeconomic History  . Marital status: Married    Spouse name: Not on file  . Number of children: Not on file  . Years of education: Not on file  . Highest education level: Not on file  Social Needs  . Financial resource strain: Not on file  . Food insecurity - worry: Not on file  . Food insecurity - inability: Not on file  . Transportation needs - medical: Not on file  . Transportation needs - non-medical: Not on file    Occupational History  . Not on file  Tobacco Use  . Smoking status: Never Smoker  . Smokeless tobacco: Never Used  Substance and Sexual Activity  . Alcohol use: No  . Drug use: Not on file  . Sexual activity: Yes    Birth control/protection: Post-menopausal  Other Topics Concern  . 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    Past Surgical History:  Procedure Laterality Date  . [place] STUDY N/A 11/29/2015   Procedure: [place] STUDY;  Surgeon: Manus Gunning, MD;  Location: [facility];  Service: Gastroenterology;  Laterality: N/A;  . ABDOMINAL HYSTERECTOMY  12/04/2000   also bilateral salpingo-oophorectomy  . CARDIAC CATHETERIZATION  2006  . CARPAL TUNNEL RELEASE  11/20/2011   Procedure: CARPAL TUNNEL RELEASE;  Surgeon: Elaina Hoops, MD;  Location: [place] [facility];  Service: Neurosurgery;  Laterality: Left;  LEFT carpal tunnel release  . CERVICAL SPINE SURGERY  2013  . CHOLECYSTECTOMY  1997  . DILATION AND CURETTAGE OF UTERUS     11/15/2000  . ESOPHAGEAL MANOMETRY N/A 09/20/2015   Procedure: ESOPHAGEAL MANOMETRY (EM);  Surgeon: Manus Gunning, MD;  Location: [facility];  Service: Gastroenterology;  Laterality: N/A;  .  HYSTEROSCOPY  11/15/2000   D & C, resection of endometrial polyps  . OOPHORECTOMY  12/04/2000   bilateral salpingo-oophorectomy done with TAH  . TUBAL LIGATION      Family History  Problem Relation Age of Onset  . Cancer Father        prostate cancer  . Hypertension Father   . Hypertension Sister   . Diabetes Brother   . Heart disease Brother        Deceased   . Hypertension Brother   . Diabetes Brother   . Heart disease Brother        Deceased  . Breast cancer Neg Hx     No Known Allergies  Current Outpatient Medications on File Prior to Visit  Medication Sig Dispense Refill  . aspirin EC 81 MG tablet Take  81 mg by mouth daily.    Marland Kitchen atorvastatin (LIPITOR) 10 MG tablet TAKE 1 TABLET BY MOUTH ONCE DAILY 90 tablet 3  . Calcium Carbonate-Vit D-Min (CALTRATE 600+D PLUS PO) Take 1 tablet by mouth 2 (two) times daily.      . fluticasone (FLONASE) 50 MCG/ACT nasal spray Place 2 sprays into both nostrils daily as needed for allergies. 16 g 2  . losartan (COZAAR) 50 MG tablet Take 1 tablet (50 mg total) by mouth daily. 90 tablet 3  . metoprolol succinate (TOPROL-XL) 50 MG 24 hr tablet TAKE 1 TABLET BY MOUTH ONCE DAILY. TAKE WITH OR IMMEDIATELY FOLLOWING A MEAL 90 tablet 1  . nitroGLYCERIN (NITROGLYN) 2 % ointment Apply 1 inch topically 4 (four) times daily as needed for chest pain.    Marland Kitchen omeprazole (PRILOSEC) 40 MG capsule TAKE 1 CAPSULE BY MOUTH DAILY 90 capsule 0  . ranitidine (ZANTAC) 150 MG tablet Take 1 tablet (150 mg total) by mouth 2 (two) times daily. 120 tablet 3  . tiZANidine (ZANAFLEX) 4 MG tablet Take 1 tablet (4 mg total) by mouth every 8 (eight) hours as needed for muscle spasms. 30 tablet 0  . desipramine (NORPRAMIN) 10 MG tablet 1 tablet (10mg ) PO qHS for 2 weeks, then increase to 2 tabs PO qHS 90 tablet 3   No current facility-administered medications on file prior to visit.     BP 132/80   Pulse 76   Temp 98.1 F (36.7 C) (Oral)   Ht 5\' 5"  (1.651 m)   Wt 158 lb (71.7 kg)   SpO2 97%   BMI 26.29 kg/m    Objective:   Physical Exam  Constitutional: She is oriented to person, place, and time.  Neck: Neck supple.  Cardiovascular: Normal rate and regular rhythm.  Pulmonary/Chest: Effort normal and breath sounds normal.  Abdominal: Soft. Bowel sounds are normal.  Neurological: She is alert and oriented to person, place, and time. She has normal reflexes. No cranial nerve deficit.  Skin: Skin is warm and dry.  Psychiatric: She has a normal mood and affect.          Assessment & Plan:  Surgical Clearance:  Scheduled for left knee arthroscopy in June 06, 2017. Exam today  unremarkable. Labs pending. ECG: NSR with rate of 69. No T-wave inversion, ST abnormalities, PAC/PVC's. No acute changes from ECG in 2016. Should be cleared for surgery pending labs.  Sheral Flow, NP

## 2017-05-17 NOTE — Patient Instructions (Signed)
We've increased your losartan from 50 mg to 100 mg. I sent in a new prescription for losartan 100 mg, take one tablet by mouth once daily.  Continue Toprol XL 50 mg once daily.   Continue to monitor your blood pressure and report readings at or above 140/90.   Stop by the lab prior to leaving today. I will notify you of your results once received.   It was a pleasure to see you today!

## 2017-05-18 ENCOUNTER — Emergency Department (HOSPITAL_COMMUNITY)
Admission: EM | Admit: 2017-05-18 | Discharge: 2017-05-18 | Disposition: A | Discharge status: Home / Self Care | Payer: BLUE CROSS/BLUE SHIELD | Attending: Emergency Medicine | Admitting: Emergency Medicine

## 2017-05-18 ENCOUNTER — Encounter (HOSPITAL_COMMUNITY): Payer: Self-pay | Admitting: Emergency Medicine

## 2017-05-18 ENCOUNTER — Other Ambulatory Visit: Payer: Self-pay

## 2017-05-18 ENCOUNTER — Emergency Department (HOSPITAL_COMMUNITY): Payer: BLUE CROSS/BLUE SHIELD

## 2017-05-18 DIAGNOSIS — R079 Chest pain, unspecified: Secondary | ICD-10-CM | POA: Insufficient documentation

## 2017-05-18 DIAGNOSIS — Z79899 Other long term (current) drug therapy: Secondary | ICD-10-CM | POA: Diagnosis not present

## 2017-05-18 DIAGNOSIS — Z7982 Long term (current) use of aspirin: Secondary | ICD-10-CM | POA: Insufficient documentation

## 2017-05-18 DIAGNOSIS — R51 Headache: Secondary | ICD-10-CM | POA: Diagnosis present

## 2017-05-18 DIAGNOSIS — R1013 Epigastric pain: Secondary | ICD-10-CM | POA: Insufficient documentation

## 2017-05-18 DIAGNOSIS — I10 Essential (primary) hypertension: Secondary | ICD-10-CM | POA: Diagnosis not present

## 2017-05-18 LAB — I-STAT TROPONIN, ED
Troponin i, poc: 0 ng/mL (ref 0.00–0.08)
Troponin i, poc: 0.01 ng/mL (ref 0.00–0.08)

## 2017-05-18 LAB — CBC
HCT: 37.4 % (ref 36.0–46.0)
Hemoglobin: 12.3 g/dL (ref 12.0–15.0)
MCH: 31.4 pg (ref 26.0–34.0)
MCHC: 32.9 g/dL (ref 30.0–36.0)
MCV: 95.4 fL (ref 78.0–100.0)
Platelets: 212 10*3/uL (ref 150–400)
RBC: 3.92 MIL/uL (ref 3.87–5.11)
RDW: 14.7 % (ref 11.5–15.5)
WBC: 5.7 10*3/uL (ref 4.0–10.5)

## 2017-05-18 LAB — HEPATIC FUNCTION PANEL
ALT: 21 U/L (ref 14–54)
AST: 23 U/L (ref 15–41)
Albumin: 3.5 g/dL (ref 3.5–5.0)
Alkaline Phosphatase: 39 U/L (ref 38–126)
Bilirubin, Direct: <0.1 mg/dL — ABNORMAL LOW (ref 0.1–0.5)
Total Bilirubin: 0.8 mg/dL (ref 0.3–1.2)
Total Protein: 6.2 g/dL — ABNORMAL LOW (ref 6.5–8.1)

## 2017-05-18 LAB — BASIC METABOLIC PANEL
Anion gap: 7 (ref 5–15)
BUN: 16 mg/dL (ref 6–20)
CO2: 23 mmol/L (ref 22–32)
Calcium: 8.9 mg/dL (ref 8.9–10.3)
Chloride: 109 mmol/L (ref 101–111)
Creatinine, Ser: 0.92 mg/dL (ref 0.44–1.00)
GFR calc Af Amer: >60 mL/min (ref >60)
GFR calc non Af Amer: >60 mL/min (ref >60)
Glucose, Bld: 129 mg/dL — ABNORMAL HIGH (ref 65–99)
Potassium: 3.8 mmol/L (ref 3.5–5.1)
Sodium: 139 mmol/L (ref 135–145)

## 2017-05-18 LAB — LIPASE, BLOOD: Lipase: 46 U/L (ref 11–51)

## 2017-05-18 MED ORDER — GI COCKTAIL ~~LOC~~
30.0000 mL | Freq: Once | ORAL | Status: AC
  Administered 2017-05-18: 30 mL via ORAL
  Filled 2017-05-18: qty 30

## 2017-05-18 MED ORDER — PANTOPRAZOLE SODIUM 40 MG PO TBEC: 40.0000 mg | DELAYED_RELEASE_TABLET | Freq: Every day | ORAL | 0 refills | Status: DC

## 2017-05-18 MED ORDER — PANTOPRAZOLE SODIUM 40 MG PO TBEC
40.0000 mg | DELAYED_RELEASE_TABLET | Freq: Once | ORAL | Status: AC
  Administered 2017-05-18: 40 mg via ORAL
  Filled 2017-05-18: qty 1

## 2017-05-18 NOTE — ED Provider Notes (Signed)
[facility] EMERGENCY DEPARTMENT Provider Note   CSN: [id number] Arrival date & time: 05/18/17  0149     History   Chief Complaint Chief Complaint  Patient presents with  . Hypertension    HPI Valerie [name] is a 66 y.o. female.  The history is provided by the patient.  She relates that she woke up with a headache and new that her blood pressure was elevated.  Blood pressure at home had been as high as 818 systolic whereas it normally runs under 299 systolic.  This evening, she also had vague midsternal chest pain which she rated at 5/10.  This pain has resolved.  There is no associated dyspnea, nausea, diaphoresis.  She has been having some epigastric pain over the last several weeks discussed this with her primary care provider, but has received no medication and had no testing regarding it.  She does have history of hypertension and hyperlipidemia, but no history of tobacco use, or diabetes.  Past Medical History:  Diagnosis Date  . Acid reflux   . Adenocarcinoma of the endometrium/uterus ([place]) 12/04/2000  . Anal fissure   . Atrophic vaginitis   . Esophagitis   . Hernia    Present to proximal to umbillical   . Hypertension   . Internal hemorrhoids   . Menorrhagia   . Osteopenia   . PONV (postoperative nausea and vomiting)   . Vertigo   . Vitamin D deficiency     Patient Active Problem List   Diagnosis Date Noted  . Neck pain 01/22/2017  . Prediabetes 11/16/2016  . Hyperlipidemia 11/16/2015  . Esophageal reflux   . Hemorrhoid 06/30/2015  . Atypical chest pain 01/04/2015  . Palpitations 01/04/2015  . Preventative health care 10/09/2014  . Benign paroxysmal positional vertigo 08/31/2014  . Hernia   . Menorrhagia   . Osteopenia   . Vitamin D deficiency   . Adenocarcinoma of the endometrium/uterus ([place])   . Generalized anxiety disorder 01/23/2007  . Essential hypertension 01/23/2007    Past Surgical History:  Procedure Laterality Date  . [place] STUDY N/A 11/29/2015   Procedure: [place] STUDY;  Surgeon: Manus Gunning, MD;  [facility];  Service: Gastroenterology;  Laterality: N/A;  . ABDOMINAL HYSTERECTOMY  12/04/2000   also bilateral salpingo-oophorectomy  . CARDIAC CATHETERIZATION  2006  . CARPAL TUNNEL RELEASE  11/20/2011   Procedure: CARPAL TUNNEL RELEASE;  Surgeon: Elaina Hoops, MD;  Location: [facility];  Service: Neurosurgery;  Laterality: Left;  LEFT carpal tunnel release  . CERVICAL SPINE SURGERY  2013  . CHOLECYSTECTOMY  1997  . DILATION AND CURETTAGE OF UTERUS     11/15/2000  . ESOPHAGEAL MANOMETRY N/A 09/20/2015   Procedure: ESOPHAGEAL MANOMETRY (EM);  Surgeon: Manus Gunning, MD;  [facility];  Service: Gastroenterology;  Laterality: N/A;  . HYSTEROSCOPY  11/15/2000   D & C, resection of endometrial polyps  . OOPHORECTOMY  12/04/2000   bilateral salpingo-oophorectomy done with TAH  . TUBAL LIGATION      OB History    Gravida Para Term Preterm AB Living   3 2 2   1 2    SAB TAB Ectopic Multiple Live Births     1     2       Home Medications    Prior to Admission medications   Medication Sig Start Date End Date Taking? Authorizing Provider  aspirin EC 81 MG tablet Take 81 mg by  mouth daily.    [provider]  atorvastatin (LIPITOR) 10 MG tablet TAKE 1 TABLET BY MOUTH ONCE DAILY 12/12/16   Pleas Koch, NP  Calcium Carbonate-Vit D-Min (CALTRATE 600+D PLUS PO) Take 1 tablet by mouth 2 (two) times daily.      [provider]  desipramine (NORPRAMIN) 10 MG tablet 1 tablet (10mg ) PO qHS for 2 weeks, then increase to 2 tabs PO qHS 12/06/15 01/22/17  Armbruster, Carlota Raspberry, MD  fluticasone (FLONASE) 50 MCG/ACT nasal spray Place 2 sprays into both nostrils daily as needed for allergies. 06/02/16   Jearld Fenton, NP  losartan (COZAAR) 100 MG tablet Take 1 tablet (100 mg total) by mouth daily. 05/17/17   Pleas Koch, NP  metoprolol succinate  (TOPROL-XL) 50 MG 24 hr tablet TAKE 1 TABLET BY MOUTH ONCE DAILY. TAKE WITH OR IMMEDIATELY FOLLOWING A MEAL 04/23/17   Pleas Koch, NP  nitroGLYCERIN (NITROGLYN) 2 % ointment Apply 1 inch topically 4 (four) times daily as needed for chest pain.    [provider]  omeprazole (PRILOSEC) 40 MG capsule TAKE 1 CAPSULE BY MOUTH DAILY 04/04/17   Pleas Koch, NP  ranitidine (ZANTAC) 150 MG tablet Take 1 tablet (150 mg total) by mouth 2 (two) times daily. 08/24/15   Armbruster, Carlota Raspberry, MD  tiZANidine (ZANAFLEX) 4 MG tablet Take 1 tablet (4 mg total) by mouth every 8 (eight) hours as needed for muscle spasms. 01/22/17   Pleas Koch, NP    Family History Family History  Problem Relation Age of Onset  . Cancer Father        prostate cancer  . Hypertension Father   . Hypertension Sister   . Diabetes Brother   . Heart disease Brother        Deceased   . Hypertension Brother   . Diabetes Brother   . Heart disease Brother        Deceased  . Breast cancer Neg Hx     Social History Social History   Tobacco Use  . Smoking status: Never Smoker  . Smokeless tobacco: Never Used  Substance Use Topics  . Alcohol use: No  . Drug use: Not on file     Allergies   Patient has no known allergies.   Review of Systems Review of Systems  All other systems reviewed and are negative.    Physical Exam Updated Vital Signs BP (!) 147/81 (BP Location: Right Arm)   Pulse 78   Temp 98.5 F (36.9 C) (Oral)   Resp 18   Ht 5\' 5"  (1.651 m)   Wt 70.3 kg (155 lb)   SpO2 97%   BMI 25.79 kg/m   Physical Exam  Nursing note and vitals reviewed.  67 year old female, resting comfortably and in no acute distress. Vital signs are significant for mild hypertension. Oxygen saturation is 97%, which is normal. Head is normocephalic and atraumatic. PERRLA, EOMI. Oropharynx is clear. Neck is nontender and supple without adenopathy or JVD. Back is nontender and there is no CVA  tenderness. Lungs are clear without rales, wheezes, or rhonchi. Chest is nontender. Heart has regular rate and rhythm without murmur. Abdomen is soft, flat, with mild epigastric tenderness.  There is no rebound or guarding.  There are no masses or hepatosplenomegaly and peristalsis is normoactive. Extremities have no cyanosis or edema, full range of motion is present. Skin is warm and dry without rash. Neurologic: Mental status is normal, cranial nerves  are intact, there are no motor or sensory deficits.  ED Treatments / Results  Labs (all labs ordered are listed, but only abnormal results are displayed) Labs Reviewed  BASIC METABOLIC PANEL - Abnormal; Notable for the following components:      Result Value   Glucose, Bld 129 (*)    All other components within normal limits  HEPATIC FUNCTION PANEL - Abnormal; Notable for the following components:   Total Protein 6.2 (*)    Bilirubin, Direct <0.1 (*)    All other components within normal limits  CBC  LIPASE, BLOOD  I-STAT TROPONIN, ED  I-STAT TROPONIN, ED    EKG  EKG Interpretation  Date/Time:  Friday May 18 2017 02:16:31 EST Ventricular Rate:  68 PR Interval:  142 QRS Duration: 72 QT Interval:  398 QTC Calculation: 423 R Axis:   -21 Text Interpretation:  Normal sinus rhythm Low voltage QRS Borderline ECG When compared with ECG of 11/15/2014, No significant change was found Confirmed by Delora Fuel ([id number]) on 05/18/2017 2:29:39 AM       Radiology Dg Chest 2 View  Result Date: 05/18/2017 CLINICAL DATA:  Hypertension and chest and EXAM: CHEST  2 VIEW COMPARISON:  Chest radiograph 11/15/2014 FINDINGS: The heart size and mediastinal contours are within normal limits. Both lungs are clear. The visualized skeletal structures are unremarkable. IMPRESSION: No active cardiopulmonary disease. Electronically Signed   By: Ulyses Jarred M.D.   On: 05/18/2017 02:52    Procedures Procedures (including critical care  time)  Medications Ordered in ED Medications  gi cocktail (Maalox,Lidocaine,Donnatal) (not administered)  pantoprazole (PROTONIX) EC tablet 40 mg (not administered)     Initial Impression / Assessment and Plan / ED Course  I have reviewed the triage vital signs and the nursing notes.  Pertinent labs & imaging results that were available during my care of the patient were reviewed by me and considered in my medical decision making (see chart for details).  Labile hypertension.  Transient episode of chest pain.  I have low index of suspicion for ACS.  Initial ECG showed no ST or T changes and initial troponin is normal.  Will check delta troponin.  Abdominal discomfort which may reflect GERD or ulcer.  Will give therapeutic trial of a GI cocktail.  Old records are reviewed, and she does have one prior ED visit for chest pain.  Of note, her heart score is 2, which puts her at a low risk for major adverse cardiac events in the next 30 days.  Repeat troponin is normal.  She had excellent relief of symptoms with GI cocktail.  She is discharged with prescription for pantoprazole, follow-up with PCP.  Encouraged to continue monitoring her blood pressure, but advised that she would not need to come into the ED for isolated elevated blood pressure readings.  Final Clinical Impressions(s) / ED Diagnoses   Final diagnoses:  Epigastric pain  Essential hypertension    ED Discharge Orders        Ordered    pantoprazole (PROTONIX) 40 MG tablet  Daily     02/63/78 [id number]       Delora Fuel, MD 02/77/41 [id number]

## 2017-05-18 NOTE — ED Triage Notes (Signed)
Pt reports she checked her BP last night and it was 063'K systolic. Pt also reports episode of CP last night, central non radiating, 6/10. Currently CP free

## 2017-05-18 NOTE — Discharge Instructions (Signed)
Continue to monitor your blood pressure daily. Return if you are having any problems.

## 2017-06-29 ENCOUNTER — Other Ambulatory Visit: Payer: Self-pay | Admitting: Primary Care

## 2017-06-29 DIAGNOSIS — K219 Gastro-esophageal reflux disease without esophagitis: Secondary | ICD-10-CM

## 2017-07-04 ENCOUNTER — Other Ambulatory Visit: Payer: Self-pay | Admitting: Primary Care

## 2017-07-04 DIAGNOSIS — K219 Gastro-esophageal reflux disease without esophagitis: Secondary | ICD-10-CM

## 2017-07-05 NOTE — Telephone Encounter (Signed)
Rx sent to pharmacy   

## 2017-07-05 NOTE — Telephone Encounter (Signed)
Ok to refill? Electronically refill request for omeprazole (PRILOSEC) 40 MG capsule  Last prescribed on 04/04/2017. Last seen on 05/17/2017

## 2017-08-22 ENCOUNTER — Encounter: Payer: Self-pay | Admitting: Primary Care

## 2017-08-22 ENCOUNTER — Ambulatory Visit (INDEPENDENT_AMBULATORY_CARE_PROVIDER_SITE_OTHER): Payer: BLUE CROSS/BLUE SHIELD | Admitting: Primary Care

## 2017-08-22 VITALS — BP 120/78 | HR 65 | Temp 97.7°F | Ht 65.0 in | Wt 159.5 lb

## 2017-08-22 DIAGNOSIS — K219 Gastro-esophageal reflux disease without esophagitis: Secondary | ICD-10-CM | POA: Diagnosis not present

## 2017-08-22 DIAGNOSIS — J309 Allergic rhinitis, unspecified: Secondary | ICD-10-CM

## 2017-08-22 DIAGNOSIS — R1084 Generalized abdominal pain: Secondary | ICD-10-CM | POA: Diagnosis not present

## 2017-08-22 LAB — COMPREHENSIVE METABOLIC PANEL
ALT: 16 U/L (ref 0–35)
AST: 17 U/L (ref 0–37)
Albumin: 3.9 g/dL (ref 3.5–5.2)
Alkaline Phosphatase: 50 U/L (ref 39–117)
BUN: 18 mg/dL (ref 6–23)
CO2: 29 mEq/L (ref 19–32)
Calcium: 9.4 mg/dL (ref 8.4–10.5)
Chloride: 107 mEq/L (ref 96–112)
Creatinine, Ser: 0.85 mg/dL (ref 0.40–1.20)
GFR: 71.35 mL/min (ref >60.00)
Glucose, Bld: 125 mg/dL — ABNORMAL HIGH (ref 70–99)
Potassium: 4.2 mEq/L (ref 3.5–5.1)
Sodium: 143 mEq/L (ref 135–145)
Total Bilirubin: 0.8 mg/dL (ref 0.2–1.2)
Total Protein: 7.1 g/dL (ref 6.0–8.3)

## 2017-08-22 LAB — CBC WITH DIFFERENTIAL/PLATELET
Basophils Absolute: 0 10*3/uL (ref 0.0–0.1)
Basophils Relative: 0.8 % (ref 0.0–3.0)
Eosinophils Absolute: 0.1 10*3/uL (ref 0.0–0.7)
Eosinophils Relative: 1.6 % (ref 0.0–5.0)
HCT: 41.1 % (ref 36.0–46.0)
Hemoglobin: 13.8 g/dL (ref 12.0–15.0)
Lymphocytes Relative: 31.8 % (ref 12.0–46.0)
Lymphs Abs: 1.4 10*3/uL (ref 0.7–4.0)
MCHC: 33.6 g/dL (ref 30.0–36.0)
MCV: 95.2 fl (ref 78.0–100.0)
Monocytes Absolute: 0.4 10*3/uL (ref 0.1–1.0)
Monocytes Relative: 7.7 % (ref 3.0–12.0)
Neutro Abs: 2.6 10*3/uL (ref 1.4–7.7)
Neutrophils Relative %: 58.1 % (ref 43.0–77.0)
Platelets: 246 10*3/uL (ref 150.0–400.0)
RBC: 4.32 Mil/uL (ref 3.87–5.11)
RDW: 13.9 % (ref 11.5–15.5)
WBC: 4.5 10*3/uL (ref 4.0–10.5)

## 2017-08-22 LAB — LIPASE: Lipase: 32 U/L (ref 11.0–59.0)

## 2017-08-22 MED ORDER — LEVOCETIRIZINE DIHYDROCHLORIDE 5 MG PO TABS: ORAL_TABLET | ORAL | 0 refills | Status: DC

## 2017-08-22 MED ORDER — PANTOPRAZOLE SODIUM 40 MG PO TBEC: DELAYED_RELEASE_TABLET | ORAL | 0 refills | Status: DC

## 2017-08-22 NOTE — Progress Notes (Signed)
Subjective:    Patient ID: Valerie [name], female    DOB: 1950-12-31, 67 y.o.   MRN: [id number]  HPI  Valerie [name] is a 67 year old female with a history of GERD, chest pain, epigastric pain, hemorrhoids who presents today with multiple complaints.  1) Abdominal Pain: Currently managed on omeprazole 40 mg. Pain is located to the epigastric region with radiation down to the mid abdomen that has been present for the past three weeks. She describes her pain as sharp, achy, dull. She also reports constipation and is having bowel movements 1-2 times weekly which is chronic for her. She can still notice esophageal burning and reflux of gastric contents despite omeprazole 40 mg. She denies nausea, vomiting, diarrhea, recent rectal bleeding.   She underwent upper endoscopy in 2017 which revealed esophagitis but was otherwise unremarkable.   2) Nasal Congestion: Present for the past 2-3 weeks. Presented to Urgent Care 1 week ago and was told that she had allergies. She also had a cerumen impaction (which was irrigated) and inflammation to her nose. She was provided with a prescription for Flonase which has provided her with some improvement. She's blowing clear mucous from her nasal cavity. She denies fevers.   Review of Systems  Constitutional: Negative for chills and fever.  HENT: Positive for congestion, postnasal drip, rhinorrhea and sinus pressure. Negative for ear pain and sore throat.   Eyes: Positive for itching.  Respiratory: Negative for cough.   Gastrointestinal: Positive for abdominal pain and constipation. Negative for diarrhea, nausea and vomiting.       Esophageal burning and reflux.       Past Medical History:  Diagnosis Date  . Acid reflux   . Adenocarcinoma of the endometrium/uterus ([place]) 12/04/2000  . Anal fissure   . Atrophic vaginitis   . Esophagitis   . Hernia    Present to proximal to umbillical   . Hypertension   . Internal hemorrhoids   . Menorrhagia   . Osteopenia   .  PONV (postoperative nausea and vomiting)   . Vertigo   . Vitamin D deficiency      Social History   Socioeconomic History  . Marital status: Married    Spouse name: Not on file  . Number of children: Not on file  . Years of education: Not on file  . Highest education level: Not on file  Occupational History  . Not on file  Social Needs  . Financial resource strain: Not on file  . Food insecurity:    Worry: Not on file    Inability: Not on file  . Transportation needs:    Medical: Not on file    Non-medical: Not on file  Tobacco Use  . Smoking status: Never Smoker  . Smokeless tobacco: Never Used  Substance and Sexual Activity  . Alcohol use: No  . Drug use: Not on file  . Sexual activity: Yes    Birth control/protection: Post-menopausal  Lifestyle  . Physical activity:    Days per week: Not on file    Minutes per session: Not on file  . Stress: Not on file  Relationships  . Social connections:    Talks on phone: Not on file    Gets together: Not on file    Attends religious service: Not on file    Active member of club or organization: Not on file    Attends meetings of clubs or organizations: Not on file    Relationship status: Not  on file  . Intimate partner violence:    Fear of current or ex partner: Not on file    Emotionally abused: Not on file    Physically abused: Not on file    Forced sexual activity: Not on file  Other Topics Concern  . 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    Past Surgical History:  Procedure Laterality Date  . [place] STUDY N/A 11/29/2015   Procedure: [place] STUDY;  Surgeon: Manus Gunning, MD;  [facility];  Service: Gastroenterology;  Laterality: N/A;  . ABDOMINAL HYSTERECTOMY  12/04/2000   also bilateral salpingo-oophorectomy  . CARDIAC CATHETERIZATION  2006  . CARPAL TUNNEL  RELEASE  11/20/2011   Procedure: CARPAL TUNNEL RELEASE;  Surgeon: Elaina Hoops, MD;  Location: [facility];  Service: Neurosurgery;  Laterality: Left;  LEFT carpal tunnel release  . CERVICAL SPINE SURGERY  2013  . CHOLECYSTECTOMY  1997  . DILATION AND CURETTAGE OF UTERUS     11/15/2000  . ESOPHAGEAL MANOMETRY N/A 09/20/2015   Procedure: ESOPHAGEAL MANOMETRY (EM);  Surgeon: Manus Gunning, MD;  [facility];  Service: Gastroenterology;  Laterality: N/A;  . HYSTEROSCOPY  11/15/2000   D & C, resection of endometrial polyps  . OOPHORECTOMY  12/04/2000   bilateral salpingo-oophorectomy done with TAH  . TUBAL LIGATION      Family History  Problem Relation Age of Onset  . Cancer Father        prostate cancer  . Hypertension Father   . Hypertension Sister   . Diabetes Brother   . Heart disease Brother        Deceased   . Hypertension Brother   . Diabetes Brother   . Heart disease Brother        Deceased  . Breast cancer Neg Hx     No Known Allergies  Current Outpatient Medications on File Prior to Visit  Medication Sig Dispense Refill  . aspirin EC 81 MG tablet Take 81 mg by mouth daily.    Marland Kitchen atorvastatin (LIPITOR) 10 MG tablet TAKE 1 TABLET BY MOUTH ONCE DAILY 90 tablet 3  . Calcium Carbonate-Vit D-Min (CALTRATE 600+D PLUS PO) Take 1 tablet by mouth 2 (two) times daily.      Marland Kitchen desipramine (NORPRAMIN) 10 MG tablet 1 tablet (10mg ) PO qHS for 2 weeks, then increase to 2 tabs PO qHS 90 tablet 3  . fluticasone (FLONASE) 50 MCG/ACT nasal spray Place 2 sprays into both nostrils daily as needed for allergies. 16 g 2  . losartan (COZAAR) 100 MG tablet Take 1 tablet (100 mg total) by mouth daily. 90 tablet 3  . metoprolol succinate (TOPROL-XL) 50 MG 24 hr tablet TAKE 1 TABLET BY MOUTH ONCE DAILY. TAKE WITH OR IMMEDIATELY FOLLOWING A MEAL 90 tablet 1  . nitroGLYCERIN (NITROGLYN) 2 % ointment Apply 1 inch topically 4 (four) times daily as needed for chest pain.    . ranitidine  (ZANTAC) 150 MG tablet Take 1 tablet (150 mg total) by mouth 2 (two) times daily. 120 tablet 3  . tiZANidine (ZANAFLEX) 4 MG tablet Take 1 tablet (4 mg total) by mouth every 8 (eight) hours as needed for muscle spasms. 30 tablet 0   No current facility-administered medications on file prior to visit.     BP  120/78   Pulse 65   Temp 97.7 F (36.5 C) (Oral)   Ht 5\' 5"  (1.651 m)   Wt 159 lb 8 oz (72.3 kg)   SpO2 95%   BMI 26.54 kg/m    Objective:   Physical Exam  Constitutional: She appears well-nourished. She does not appear ill.  HENT:  Right Ear: Tympanic membrane and ear canal normal. Tympanic membrane is not erythematous.  Left Ear: Ear canal normal. Tympanic membrane is bulging. Tympanic membrane is not erythematous.  Nose: Mucosal edema present. Right sinus exhibits maxillary sinus tenderness. Right sinus exhibits no frontal sinus tenderness. Left sinus exhibits maxillary sinus tenderness. Left sinus exhibits no frontal sinus tenderness.  Mouth/Throat: Oropharynx is clear and moist.  Eyes: Conjunctivae are normal.  Neck: Neck supple.  Cardiovascular: Normal rate and regular rhythm.  Pulmonary/Chest: Effort normal and breath sounds normal. She has no wheezes. She has no rales.  Abdominal: Soft. Normal appearance and bowel sounds are normal. There is generalized tenderness.  Lymphadenopathy:    She has no cervical adenopathy.  Skin: Skin is warm and dry.          Assessment & Plan:

## 2017-08-22 NOTE — Addendum Note (Signed)
Addended by: Pleas Koch on: 08/22/2017 09:07 AM   Modules accepted: Orders

## 2017-08-22 NOTE — Assessment & Plan Note (Addendum)
Suspect this to be the cause of her abdominal pain as she lacks other cardinal symptoms. She does not appear sickly, she has no guarding. Vitals stable.  Check CBC, Lipase and CMP.  GERD is Uncontrolled despite omeprazole 40 mg, is not taking Zantac HS. Will stop omeprazole, switch to pantoprazole 40 mg. Consider adding in Zantac 150 once daily if symptoms persist. Discussed GERD triggers and precautions.  She will update in 1-2 weeks.

## 2017-08-22 NOTE — Patient Instructions (Addendum)
Stop omeprazole 40 mg. Start pantoprazole 40 mg tablets. Take 1 tablet by mouth once daily. Please update me in 1-2 weeks if abdominal pain persists.  Start levocetirizine 5 mg tablets for allergies. Take 1 tablet by mouth once nightly. Continue Flonase as needed.  Stop by the lab prior to leaving today. I will notify you of your results once received.   Please update me in 1-2 weeks or sooner if you develop increased abdominal pain, vomiting, bloody stools, fevers.  It was a pleasure to see you today!   Food Choices for Gastroesophageal Reflux Disease, Adult When you have gastroesophageal reflux disease (GERD), the foods you eat and your eating habits are very important. Choosing the right foods can help ease your discomfort. What guidelines do I need to follow?  Choose fruits, vegetables, whole grains, and low-fat dairy products.  Choose low-fat meat, fish, and poultry.  Limit fats such as oils, salad dressings, butter, nuts, and avocado.  Keep a food diary. This helps you identify foods that cause symptoms.  Avoid foods that cause symptoms. These may be different for everyone.  Eat small meals often instead of 3 large meals a day.  Eat your meals slowly, in a place where you are relaxed.  Limit fried foods.  Cook foods using methods other than frying.  Avoid drinking alcohol.  Avoid drinking large amounts of liquids with your meals.  Avoid bending over or lying down until 2-3 hours after eating. What foods are not recommended? These are some foods and drinks that may make your symptoms worse: Vegetables Tomatoes. Tomato juice. Tomato and spaghetti sauce. Chili peppers. Onion and garlic. Horseradish. Fruits Oranges, grapefruit, and lemon (fruit and juice). Meats High-fat meats, fish, and poultry. This includes hot dogs, ribs, ham, sausage, salami, and bacon. Dairy Whole milk and chocolate milk. Sour cream. Cream. Butter. Ice cream. Cream cheese. Drinks Coffee and  tea. Bubbly (carbonated) drinks or energy drinks. Condiments Hot sauce. Barbecue sauce. Sweets/Desserts Chocolate and cocoa. Donuts. Peppermint and spearmint. Fats and Oils High-fat foods. This includes [place] fries and potato chips. Other Vinegar. Strong spices. This includes black pepper, white pepper, red pepper, cayenne, curry powder, cloves, ginger, and chili powder. The items listed above may not be a complete list of foods and drinks to avoid. Contact your dietitian for more information. This information is not intended to replace advice given to you by your health care provider. Make sure you discuss any questions you have with your health care provider. Document Released: 11/14/2011 Document Revised: 10/21/2015 Document Reviewed: 03/19/2013 Elsevier Interactive Patient Education  2017 [employer].

## 2017-08-22 NOTE — Assessment & Plan Note (Signed)
No signs of infection, do suspect allergy involvement. Rx for Xyzal sent to pharmacy to start HS. She'll update in 1-2 weeks. Continue Flonase PRN.

## 2017-08-23 ENCOUNTER — Telehealth: Payer: Self-pay | Admitting: Primary Care

## 2017-08-23 NOTE — Telephone Encounter (Signed)
Copied from [place] [id number]. Topic: Quick Communication - See Telephone Encounter >> Aug 23, 2017  5:17 PM Ivar Drape wrote: CRM for notification. See Telephone encounter for: 08/23/17. Patient would like a return telephone call for the results of her recent labs.

## 2017-08-24 NOTE — Telephone Encounter (Signed)
Please see result note; pt notified as instructed and voiced understanding.

## 2017-08-29 ENCOUNTER — Ambulatory Visit (INDEPENDENT_AMBULATORY_CARE_PROVIDER_SITE_OTHER): Payer: BLUE CROSS/BLUE SHIELD | Admitting: Primary Care

## 2017-08-29 ENCOUNTER — Encounter: Payer: Self-pay | Admitting: Primary Care

## 2017-08-29 VITALS — BP 118/78 | HR 67 | Temp 98.0°F | Wt 160.5 lb

## 2017-08-29 DIAGNOSIS — K2100 Gastro-esophageal reflux disease with esophagitis, without bleeding: Secondary | ICD-10-CM

## 2017-08-29 DIAGNOSIS — R300 Dysuria: Secondary | ICD-10-CM | POA: Diagnosis not present

## 2017-08-29 LAB — POC URINALSYSI DIPSTICK (AUTOMATED)
Bilirubin, UA: NEGATIVE
Glucose, UA: NEGATIVE
Ketones, UA: NEGATIVE
Nitrite, UA: NEGATIVE
Protein, UA: NEGATIVE
Spec Grav, UA: 1.025 (ref 1.010–1.025)
Urobilinogen, UA: 0.2 E.U./dL
pH, UA: 6 (ref 5.0–8.0)

## 2017-08-29 MED ORDER — RANITIDINE HCL 150 MG PO TABS: 150.0000 mg | ORAL_TABLET | Freq: Every day | ORAL | 0 refills | Status: DC

## 2017-08-29 NOTE — Progress Notes (Signed)
Subjective:    Patient ID: Valerie [name], female    DOB: Nov 24, 1950, 67 y.o.   MRN: [id number]  HPI  Valerie [name] is a 67 year old female with a history of hemorrhoids, GERD, abdominal pain who presents today with a chief complaint of abdominal pain. She is also reporting mild dysuria.   She was last evaluated one week ago with a three week history of epigastric and mid abdominal pain with esophageal burning, esophageal reflux, constipation (chronic). During her last visit her symptoms were suspected to be secondary to uncontrolled esophageal reflux so her omeprazole 40 mg was changed to pantoprazole 40 mg.  Since her last visit she's noticed some improvement in symptoms with pantoprazole but she continues to notice epigastric pain with radiation to the mid abdomen. She denies nausea, vomiting, diarrhea, rectal bleeding, fevers. She has not seen her GI doctor since 2017; underwent endoscopy (esophagitis), manometry, PH with impedene study at that time.   Review of Systems  Constitutional: Negative for fever.  Respiratory: Negative for shortness of breath.   Cardiovascular: Negative for chest pain.  Gastrointestinal: Positive for abdominal pain and constipation. Negative for blood in stool, diarrhea, nausea and vomiting.       Past Medical History:  Diagnosis Date  . Acid reflux   . Adenocarcinoma of the endometrium/uterus ([place]) 12/04/2000  . Anal fissure   . Atrophic vaginitis   . Esophagitis   . Hernia    Present to proximal to umbillical   . Hypertension   . Internal hemorrhoids   . Menorrhagia   . Osteopenia   . PONV (postoperative nausea and vomiting)   . Vertigo   . Vitamin D deficiency      Social History   Socioeconomic History  . Marital status: Married    Spouse name: Not on file  . Number of children: Not on file  . Years of education: Not on file  . Highest education level: Not on file  Occupational History  . Not on file  Social Needs  . Financial resource  strain: Not on file  . Food insecurity:    Worry: Not on file    Inability: Not on file  . Transportation needs:    Medical: Not on file    Non-medical: Not on file  Tobacco Use  . Smoking status: Never Smoker  . Smokeless tobacco: Never Used  Substance and Sexual Activity  . Alcohol use: No  . Drug use: Not on file  . Sexual activity: Yes    Birth control/protection: Post-menopausal  Lifestyle  . Physical activity:    Days per week: Not on file    Minutes per session: Not on file  . Stress: Not on file  Relationships  . Social connections:    Talks on phone: Not on file    Gets together: Not on file    Attends religious service: Not on file    Active member of club or organization: Not on file    Attends meetings of clubs or organizations: Not on file    Relationship status: Not on file  . Intimate partner violence:    Fear of current or ex partner: Not on file    Emotionally abused: Not on file    Physically abused: Not on file    Forced sexual activity: Not on file  Other Topics Concern  . 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    Past Surgical History:  Procedure Laterality Date  . [place] STUDY N/A 11/29/2015   Procedure: [place] STUDY;  Surgeon: Manus Gunning, MD;  Location: [facility];  Service: Gastroenterology;  Laterality: N/A;  . ABDOMINAL HYSTERECTOMY  12/04/2000   also bilateral salpingo-oophorectomy  . CARDIAC CATHETERIZATION  2006  . CARPAL TUNNEL RELEASE  11/20/2011   Procedure: CARPAL TUNNEL RELEASE;  Surgeon: Elaina Hoops, MD;  Location: [place] [facility];  Service: Neurosurgery;  Laterality: Left;  LEFT carpal tunnel release  . CERVICAL SPINE SURGERY  2013  . CHOLECYSTECTOMY  1997  . DILATION AND CURETTAGE OF UTERUS     11/15/2000  . ESOPHAGEAL MANOMETRY N/A 09/20/2015   Procedure: ESOPHAGEAL MANOMETRY (EM);  Surgeon:  Manus Gunning, MD;  Location: [facility];  Service: Gastroenterology;  Laterality: N/A;  . HYSTEROSCOPY  11/15/2000   D & C, resection of endometrial polyps  . OOPHORECTOMY  12/04/2000   bilateral salpingo-oophorectomy done with TAH  . TUBAL LIGATION      Family History  Problem Relation Age of Onset  . Cancer Father        prostate cancer  . Hypertension Father   . Hypertension Sister   . Diabetes Brother   . Heart disease Brother        Deceased   . Hypertension Brother   . Diabetes Brother   . Heart disease Brother        Deceased  . Breast cancer Neg Hx     No Known Allergies  Current Outpatient Medications on File Prior to Visit  Medication Sig Dispense Refill  . aspirin EC 81 MG tablet Take 81 mg by mouth daily.    Marland Kitchen atorvastatin (LIPITOR) 10 MG tablet TAKE 1 TABLET BY MOUTH ONCE DAILY 90 tablet 3  . Calcium Carbonate-Vit D-Min (CALTRATE 600+D PLUS PO) Take 1 tablet by mouth 2 (two) times daily.      . fluticasone (FLONASE) 50 MCG/ACT nasal spray Place 2 sprays into both nostrils daily as needed for allergies. 16 g 2  . levocetirizine (XYZAL) 5 MG tablet Take 1 tablet by mouth every evening for allergies. 90 tablet 0  . losartan (COZAAR) 100 MG tablet Take 1 tablet (100 mg total) by mouth daily. 90 tablet 3  . metoprolol succinate (TOPROL-XL) 50 MG 24 hr tablet TAKE 1 TABLET BY MOUTH ONCE DAILY. TAKE WITH OR IMMEDIATELY FOLLOWING A MEAL 90 tablet 1  . nitroGLYCERIN (NITROGLYN) 2 % ointment Apply 1 inch topically 4 (four) times daily as needed for chest pain.    . pantoprazole (PROTONIX) 40 MG tablet Take 1 tablet by mouth once daily for heartburn. 90 tablet 0  . tiZANidine (ZANAFLEX) 4 MG tablet Take 1 tablet (4 mg total) by mouth every 8 (eight) hours as needed for muscle spasms. 30 tablet 0  . desipramine (NORPRAMIN) 10 MG tablet 1 tablet (10mg ) PO qHS for 2 weeks, then increase to 2 tabs PO qHS 90 tablet 3   No current facility-administered medications on  file prior to visit.     BP 118/78   Pulse 67   Temp 98 F (36.7 C) (Oral)   Wt 160 lb 8 oz (72.8 kg)   BMI 26.71 kg/m    Objective:   Physical Exam  Constitutional: She appears well-nourished.  Cardiovascular: Normal rate and regular rhythm.  Pulmonary/Chest: Effort normal and breath sounds normal.  Abdominal: Soft. Normal appearance and bowel sounds are normal. There is generalized tenderness.          Assessment & Plan:

## 2017-08-29 NOTE — Assessment & Plan Note (Addendum)
Continue to suspect symptoms are secondary to either uncontrolled GERD or IBS. Given that she's had some improvement on pantoprazole, will continue at current dose. Add in Zantac 150 mg once daily. Discussed GERD diet.  UA today with trace leuks, 2+ blood, negative nitrites. Culture pending.   Referral placed for her to schedule an appointment with her GI MD if symptoms do not improve.

## 2017-08-29 NOTE — Patient Instructions (Addendum)
Please see your GI doctor for a follow-up.   Start taking Zantac 150mg  daily (opposite time of your pantoprazole).   Avoid taking NSAIDs like Aleve (Naproxen) or Advil (Ibuprofen)  It has been a pleasure seeing you today. Denita Lung, RN, Adult-Geriatric Nurse Practitioner Student and Allie Bossier, AGNP   Gastroesophageal Reflux Disease, Adult Normally, food travels down the esophagus and stays in the stomach to be digested. If a person has gastroesophageal reflux disease (GERD), food and stomach acid move back up into the esophagus. When this happens, the esophagus becomes sore and swollen (inflamed). Over time, GERD can make small holes (ulcers) in the lining of the esophagus. Follow these instructions at home: Diet  Follow a diet as told by your doctor. You may need to avoid foods and drinks such as: ? Coffee and tea (with or without caffeine). ? Drinks that contain alcohol. ? Energy drinks and sports drinks. ? Carbonated drinks or sodas. ? Chocolate and cocoa. ? Peppermint and mint flavorings. ? Garlic and onions. ? Horseradish. ? Spicy and acidic foods, such as peppers, chili powder, curry powder, vinegar, hot sauces, and BBQ sauce. ? Citrus fruit juices and citrus fruits, such as oranges, lemons, and limes. ? Tomato-based foods, such as red sauce, chili, salsa, and pizza with red sauce. ? Fried and fatty foods, such as donuts, french fries, potato chips, and high-fat dressings. ? High-fat meats, such as hot dogs, rib eye steak, sausage, ham, and bacon. ? High-fat dairy items, such as whole milk, butter, and cream cheese.  Eat small meals often. Avoid eating large meals.  Avoid drinking large amounts of liquid with your meals.  Avoid eating meals during the 2-3 hours before bedtime.  Avoid lying down right after you eat.  Do not exercise right after you eat. General instructions  Pay attention to any changes in your symptoms.  Take over-the-counter and  prescription medicines only as told by your doctor. Do not take aspirin, ibuprofen, or other NSAIDs unless your doctor says it is okay.  Do not use any tobacco products, including cigarettes, chewing tobacco, and e-cigarettes. If you need help quitting, ask your doctor.  Wear loose clothes. Do not wear anything tight around your waist.  Raise (elevate) the head of your bed about 6 inches (15 cm).  Try to lower your stress. If you need help doing this, ask your doctor.  If you are overweight, lose an amount of weight that is healthy for you. Ask your doctor about a safe weight loss goal.  Keep all follow-up visits as told by your doctor. This is important. Contact a doctor if:  You have new symptoms.  You lose weight and you do not know why it is happening.  You have trouble swallowing, or it hurts to swallow.  You have wheezing or a cough that keeps happening.  Your symptoms do not get better with treatment.  You have a hoarse voice. Get help right away if:  You have pain in your arms, neck, jaw, teeth, or back.  You feel sweaty, dizzy, or light-headed.  You have chest pain or shortness of breath.  You throw up (vomit) and your throw up looks like blood or coffee grounds.  You pass out (faint).  Your poop (stool) is bloody or black.  You cannot swallow, drink, or eat. This information is not intended to replace advice given to you by your health care provider. Make sure you discuss any questions you have with your health care provider.  Document Released: 11/01/2007 Document Revised: 10/21/2015 Document Reviewed: 09/09/2014 Elsevier Interactive Patient Education  [employer].

## 2017-08-29 NOTE — Progress Notes (Signed)
Subjective:    Patient ID: Valerie [name], female    DOB: Dec 27, 1950, 67 y.o.   MRN: [id number]  HPI Valerie [name] is a 67 y.o. female who presents today with CC of ABD pain. Pain starts in mid epigastric and radiates diffusely throughout abdomen. Pain more present after meals, during the day, and at night when laying down. She reports that she is awaken night from pain and took ibuprofen with no relief. Was recently switched from omeprazole to pantoprazole and reports some relief. Denies N/V or diarrhea. Does have some constipation that she says is normal and reports every other day BMs without issue.   Typical Diet: - Breakfast: Banana - Lunch: Egg salad sandwich - Dinner: Salad, soup (vegetable) - Sweets/Snacks: None - Drinks: Drinking more water, ice tea, some diet soda, no coffee or juice   Review of Systems  Constitutional: Negative for appetite change, fatigue and fever.  Gastrointestinal: Negative for abdominal distention and rectal pain.       Past Medical History:  Diagnosis Date  . Acid reflux   . Adenocarcinoma of the endometrium/uterus ([place]) 12/04/2000  . Anal fissure   . Atrophic vaginitis   . Esophagitis   . Hernia    Present to proximal to umbillical   . Hypertension   . Internal hemorrhoids   . Menorrhagia   . Osteopenia   . PONV (postoperative nausea and vomiting)   . Vertigo   . Vitamin D deficiency    Past Surgical History:  Procedure Laterality Date  . [place] STUDY N/A 11/29/2015   Procedure: [place] STUDY;  Surgeon: Manus Gunning, MD;  Location: [facility];  Service: Gastroenterology;  Laterality: N/A;  . ABDOMINAL HYSTERECTOMY  12/04/2000   also bilateral salpingo-oophorectomy  . CARDIAC CATHETERIZATION  2006  . CARPAL TUNNEL RELEASE  11/20/2011   Procedure: CARPAL TUNNEL RELEASE;  Surgeon: Elaina Hoops, MD;  Location: [facility];  Service: Neurosurgery;  Laterality: Left;  LEFT carpal tunnel release  . CERVICAL SPINE SURGERY  2013    . CHOLECYSTECTOMY  1997  . DILATION AND CURETTAGE OF UTERUS     11/15/2000  . ESOPHAGEAL MANOMETRY N/A 09/20/2015   Procedure: ESOPHAGEAL MANOMETRY (EM);  Surgeon: Manus Gunning, MD;  Location: [facility];  Service: Gastroenterology;  Laterality: N/A;  . HYSTEROSCOPY  11/15/2000   D & C, resection of endometrial polyps  . OOPHORECTOMY  12/04/2000   bilateral salpingo-oophorectomy done with TAH  . TUBAL LIGATION     Social History   Socioeconomic History  . Marital status: Married    Spouse name: Not on file  . Number of children: Not on file  . Years of education: Not on file  . Highest education level: Not on file  Occupational History  . Not on file  Social Needs  . Financial resource strain: Not on file  . Food insecurity:    Worry: Not on file    Inability: Not on file  . Transportation needs:    Medical: Not on file    Non-medical: Not on file  Tobacco Use  . Smoking status: Never Smoker  . Smokeless tobacco: Never Used  Substance and Sexual Activity  . Alcohol use: No  . Drug use: Not on file  . Sexual activity: Yes    Birth control/protection: Post-menopausal  Lifestyle  . Physical activity:    Days per week: Not on file    Minutes per session: Not on file  .  Stress: Not on file  Relationships  . Social connections:    Talks on phone: Not on file    Gets together: Not on file    Attends religious service: Not on file    Active member of club or organization: Not on file    Attends meetings of clubs or organizations: Not on file    Relationship status: Not on file  . Intimate partner violence:    Fear of current or ex partner: Not on file    Emotionally abused: Not on file    Physically abused: Not on file    Forced sexual activity: Not on file  Other Topics Concern  . 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   Family  History  Problem Relation Age of Onset  . Cancer Father        prostate cancer  . Hypertension Father   . Hypertension Sister   . Diabetes Brother   . Heart disease Brother        Deceased   . Hypertension Brother   . Diabetes Brother   . Heart disease Brother        Deceased  . Breast cancer Neg Hx    Current Outpatient Medications on File Prior to Visit  Medication Sig Dispense Refill  . aspirin EC 81 MG tablet Take 81 mg by mouth daily.    Marland Kitchen atorvastatin (LIPITOR) 10 MG tablet TAKE 1 TABLET BY MOUTH ONCE DAILY 90 tablet 3  . Calcium Carbonate-Vit D-Min (CALTRATE 600+D PLUS PO) Take 1 tablet by mouth 2 (two) times daily.      . fluticasone (FLONASE) 50 MCG/ACT nasal spray Place 2 sprays into both nostrils daily as needed for allergies. 16 g 2  . levocetirizine (XYZAL) 5 MG tablet Take 1 tablet by mouth every evening for allergies. 90 tablet 0  . losartan (COZAAR) 100 MG tablet Take 1 tablet (100 mg total) by mouth daily. 90 tablet 3  . metoprolol succinate (TOPROL-XL) 50 MG 24 hr tablet TAKE 1 TABLET BY MOUTH ONCE DAILY. TAKE WITH OR IMMEDIATELY FOLLOWING A MEAL 90 tablet 1  . nitroGLYCERIN (NITROGLYN) 2 % ointment Apply 1 inch topically 4 (four) times daily as needed for chest pain.    . pantoprazole (PROTONIX) 40 MG tablet Take 1 tablet by mouth once daily for heartburn. 90 tablet 0  . ranitidine (ZANTAC) 150 MG tablet Take 1 tablet (150 mg total) by mouth 2 (two) times daily. 120 tablet 3  . tiZANidine (ZANAFLEX) 4 MG tablet Take 1 tablet (4 mg total) by mouth every 8 (eight) hours as needed for muscle spasms. 30 tablet 0  . desipramine (NORPRAMIN) 10 MG tablet 1 tablet (10mg ) PO qHS for 2 weeks, then increase to 2 tabs PO qHS 90 tablet 3   No current facility-administered medications on file prior to visit.     Objective:   Physical Exam  Constitutional: Vital signs are normal. She appears well-nourished. No distress.  Abdominal: Soft. Bowel sounds are normal. She exhibits  no distension, no abdominal bruit and no mass. There is generalized tenderness. There is no rebound, no guarding and no CVA tenderness. No hernia.    BP 118/78   Pulse 67   Temp 98 F (36.7 C) (Oral)   Wt 160 lb 8 oz (72.8 kg)   BMI 26.71 kg/m  Assessment & Plan:   1. Gastroesophageal reflux disease with esophagitis Diet reccommendations and education provided - ranitidine (ZANTAC) 150 MG tablet; Take 1 tablet (150 mg total) by mouth daily.  Dispense: 90 tablet; Refill: 0 - Ambulatory referral to Gastroenterology  Denita Lung, RN, Adult-Geriatric Nurse Practitioner Student

## 2017-08-31 LAB — URINE CULTURE
MICRO NUMBER:: 90412166
SPECIMEN QUALITY:: ADEQUATE

## 2017-09-27 ENCOUNTER — Telehealth: Payer: Self-pay

## 2017-09-27 NOTE — Telephone Encounter (Signed)
Left message for patient to call Sheron Robin back in regards to a referral-Norie Latendresse V Alyaan Budzynski, RMA   

## 2017-10-08 DIAGNOSIS — S83242D Other tear of medial meniscus, current injury, left knee, subsequent encounter: Secondary | ICD-10-CM | POA: Diagnosis not present

## 2017-10-10 ENCOUNTER — Other Ambulatory Visit: Payer: Self-pay | Admitting: Primary Care

## 2017-10-25 DIAGNOSIS — R0982 Postnasal drip: Secondary | ICD-10-CM | POA: Diagnosis not present

## 2017-10-25 DIAGNOSIS — J0100 Acute maxillary sinusitis, unspecified: Secondary | ICD-10-CM | POA: Diagnosis not present

## 2017-11-06 DIAGNOSIS — S83242D Other tear of medial meniscus, current injury, left knee, subsequent encounter: Secondary | ICD-10-CM | POA: Diagnosis not present

## 2017-11-06 DIAGNOSIS — S83282D Other tear of lateral meniscus, current injury, left knee, subsequent encounter: Secondary | ICD-10-CM | POA: Diagnosis not present

## 2017-12-03 ENCOUNTER — Other Ambulatory Visit: Payer: Self-pay | Admitting: Primary Care

## 2017-12-03 DIAGNOSIS — K219 Gastro-esophageal reflux disease without esophagitis: Secondary | ICD-10-CM

## 2017-12-12 ENCOUNTER — Other Ambulatory Visit: Payer: Self-pay | Admitting: Primary Care

## 2017-12-12 DIAGNOSIS — Z1231 Encounter for screening mammogram for malignant neoplasm of breast: Secondary | ICD-10-CM

## 2017-12-18 ENCOUNTER — Other Ambulatory Visit: Payer: Self-pay | Admitting: Primary Care

## 2018-01-01 ENCOUNTER — Ambulatory Visit (INDEPENDENT_AMBULATORY_CARE_PROVIDER_SITE_OTHER): Payer: Medicare Other | Admitting: Obstetrics and Gynecology

## 2018-01-01 ENCOUNTER — Encounter: Payer: Self-pay | Admitting: Obstetrics and Gynecology

## 2018-01-01 ENCOUNTER — Other Ambulatory Visit (HOSPITAL_COMMUNITY)
Admission: RE | Admit: 2018-01-01 | Discharge: 2018-01-01 | Disposition: A | Discharge status: Home / Self Care | Payer: Medicare Other | Source: Ambulatory Visit | Attending: Obstetrics and Gynecology | Admitting: Obstetrics and Gynecology

## 2018-01-01 VITALS — BP 114/64 | HR 82 | Ht 65.0 in | Wt 150.2 lb

## 2018-01-01 DIAGNOSIS — Z01419 Encounter for gynecological examination (general) (routine) without abnormal findings: Secondary | ICD-10-CM | POA: Diagnosis not present

## 2018-01-01 DIAGNOSIS — Z1339 Encounter for screening examination for other mental health and behavioral disorders: Secondary | ICD-10-CM

## 2018-01-01 DIAGNOSIS — R300 Dysuria: Secondary | ICD-10-CM

## 2018-01-01 DIAGNOSIS — Z124 Encounter for screening for malignant neoplasm of cervix: Secondary | ICD-10-CM | POA: Diagnosis not present

## 2018-01-01 DIAGNOSIS — Z8542 Personal history of malignant neoplasm of other parts of uterus: Secondary | ICD-10-CM | POA: Insufficient documentation

## 2018-01-01 DIAGNOSIS — Z1331 Encounter for screening for depression: Secondary | ICD-10-CM

## 2018-01-01 DIAGNOSIS — L309 Dermatitis, unspecified: Secondary | ICD-10-CM | POA: Diagnosis not present

## 2018-01-01 NOTE — Progress Notes (Signed)
Routine Annual Gynecology Examination   PCP: Pleas Koch, NP  Chief Complaint  Patient presents with  . Gynecologic Exam    History of Present Illness: Patient is a 67 y.o. [id number] presents for annual exam. The patient has no complaints today.   Menopausal bleeding: denies (she is status post TAH/BSO for reported uterine cancer in 2002).  Menopausal symptoms: denies  Breast symptoms: denies  Last pap smear: 1 year ago.  Result Normal, scheduled for tomorrow.   Last mammogram: 1 year ago.  Result Normal   Last DEXA: 2016, borderline normal/ostepenia  Last Colonoscopy: Several years ago, normal. Per Alma Friendly, NP, note: last in 2011, due 2021.   Past Medical History:  Diagnosis Date  . Acid reflux   . Adenocarcinoma of the endometrium/uterus ([place]) 12/04/2000  . Anal fissure   . Atrophic vaginitis   . Esophagitis   . Hernia    Present to proximal to umbillical   . Hypertension   . Internal hemorrhoids   . Menorrhagia   . Osteopenia   . PONV (postoperative nausea and vomiting)   . Vertigo   . Vitamin D deficiency     Past Surgical History:  Procedure Laterality Date  . [place] STUDY N/A 11/29/2015   Procedure: [place] STUDY;  Surgeon: Manus Gunning, MD;  Location: [facility];  Service: Gastroenterology;  Laterality: N/A;  . ABDOMINAL HYSTERECTOMY  12/04/2000   also bilateral salpingo-oophorectomy  . CARDIAC CATHETERIZATION  2006  . CARPAL TUNNEL RELEASE  11/20/2011   Procedure: CARPAL TUNNEL RELEASE;  Surgeon: Elaina Hoops, MD;  Location: [facility];  Service: Neurosurgery;  Laterality: Left;  LEFT carpal tunnel release  . CERVICAL SPINE SURGERY  2013  . CHOLECYSTECTOMY  1997  . DILATION AND CURETTAGE OF UTERUS     11/15/2000  . ESOPHAGEAL MANOMETRY N/A 09/20/2015   Procedure: ESOPHAGEAL MANOMETRY (EM);  Surgeon: Manus Gunning, MD;  Location: [facility];  Service: Gastroenterology;  Laterality: N/A;  . HYSTEROSCOPY   11/15/2000   D & C, resection of endometrial polyps  . OOPHORECTOMY  12/04/2000   bilateral salpingo-oophorectomy done with TAH  . TUBAL LIGATION      Medications   Medication Sig Start Date End Date Taking? Authorizing Provider  aspirin EC 81 MG tablet Take 81 mg by mouth daily.   Yes [provider]  atorvastatin (LIPITOR) 10 MG tablet Take 1 tablet (10 mg total) by mouth daily. WILL NEED APPOINTMENT FOR ANY MORE REFILLS 12/18/17  Yes Pleas Koch, NP  Calcium Carbonate-Vit D-Min (CALTRATE 600+D PLUS PO) Take 1 tablet by mouth 2 (two) times daily.     Yes [provider]  fluticasone (FLONASE) 50 MCG/ACT nasal spray Place 2 sprays into both nostrils daily as needed for allergies. 06/02/16  Yes Baity, Coralie Keens, NP  levocetirizine (XYZAL) 5 MG tablet Take 1 tablet by mouth every evening for allergies. 08/22/17  Yes Pleas Koch, NP  losartan (COZAAR) 100 MG tablet Take 1 tablet (100 mg total) by mouth daily. 05/17/17  Yes Pleas Koch, NP  metoprolol succinate (TOPROL-XL) 50 MG 24 hr tablet TAKE 1 TABLET BY MOUTH ONCE DAILY (TAKE  WITH  OR  IMMEDIATELY  FOLLOWING  A  MEAL) 10/10/17  Yes Pleas Koch, NP  nitroGLYCERIN (NITROGLYN) 2 % ointment Apply 1 inch topically 4 (four) times daily as needed for chest pain.   Yes [provider]  pantoprazole (PROTONIX) 40 MG tablet  TAKE 1 TABLET BY MOUTH DAILY FOR HEARTBURN 12/04/17  Yes Pleas Koch, NP  ranitidine (ZANTAC) 150 MG tablet Take 1 tablet (150 mg total) by mouth daily. 08/29/17  Yes Pleas Koch, NP  tiZANidine (ZANAFLEX) 4 MG tablet Take 1 tablet (4 mg total) by mouth every 8 (eight) hours as needed for muscle spasms. 01/22/17  Yes Pleas Koch, NP   Allergies: No Known Allergies  Obstetric History: [id number]  Social History   Socioeconomic History  . Marital status: Married    Spouse name: Not on file  . Number of children: Not on file  . Years of education: Not on file  .  Highest education level: Not on file  Occupational History  . Not on file  Social Needs  . Financial resource strain: Not on file  . Food insecurity:    Worry: Not on file    Inability: Not on file  . Transportation needs:    Medical: Not on file    Non-medical: Not on file  Tobacco Use  . Smoking status: Never Smoker  . Smokeless tobacco: Never Used  Substance and Sexual Activity  . Alcohol use: No  . Drug use: Not on file  . Sexual activity: Yes    Birth control/protection: Post-menopausal  Lifestyle  . Physical activity:    Days per week: 7 days    Minutes per session: Not on file  . Stress: Not on file  Relationships  . Social connections:    Talks on phone: Not on file    Gets together: Not on file    Attends religious service: Not on file    Active member of club or organization: Not on file    Attends meetings of clubs or organizations: Not on file    Relationship status: Not on file  . Intimate partner violence:    Fear of current or ex partner: Not on file    Emotionally abused: Not on file    Physically abused: Not on file    Forced sexual activity: Not on file  Other Topics Concern  . 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    Family History  Problem Relation Age of Onset  . Cancer Father        prostate cancer  . Hypertension Father   . Hypertension Sister   . Diabetes Brother   . Heart disease Brother        Deceased   . Hypertension Brother   . Diabetes Brother   . Heart disease Brother        Deceased  . Breast cancer Neg Hx     Review of Systems  Constitutional: Negative.   HENT: Negative.   Eyes: Negative.   Respiratory: Negative.   Cardiovascular: Negative.   Gastrointestinal: Negative.   Genitourinary: Negative.   Musculoskeletal: Negative.   Skin: Negative.   Neurological: Negative.   Psychiatric/Behavioral:  Negative.     Physical Exam Vitals: BP 114/64 (BP Location: Left Arm, Patient Position: Sitting, Cuff Size: Normal)   Pulse 82   Ht 5\' 5"  (1.651 m)   Wt 150 lb 4 oz (68.2 kg)   SpO2 97%   BMI 25.00 kg/m   Physical Exam  Constitutional: She is oriented to person, place, and time. She appears well-developed and well-nourished. No distress.  Genitourinary: Vagina  normal. Pelvic exam was performed with patient supine.  There is rash on the right labia. There is no tenderness or lesion on the right labia.  There is rash on the left labia. There is no tenderness or lesion on the left labia.    Right adnexum does not display mass, does not display tenderness and does not display fullness. Left adnexum does not display mass, does not display tenderness and does not display fullness.  Genitourinary Comments: Uterus and cervix surgically absent.    HENT:  Head: Normocephalic and atraumatic.  Eyes: EOM are normal. No scleral icterus.  Neck: Normal range of motion. Neck supple. No thyromegaly present.  Cardiovascular: Normal rate and regular rhythm.  Pulmonary/Chest: Effort normal and breath sounds normal. No respiratory distress. She has no wheezes. She has no rales. Right breast exhibits no inverted nipple, no mass, no nipple discharge, no skin change and no tenderness. Left breast exhibits no inverted nipple, no mass, no nipple discharge, no skin change and no tenderness.  Abdominal: Soft. Bowel sounds are normal. She exhibits no distension and no mass. There is no tenderness. There is no rebound and no guarding.  Musculoskeletal: Normal range of motion. She exhibits no edema.  Lymphadenopathy:    She has no cervical adenopathy.  Neurological: She is alert and oriented to person, place, and time. No cranial nerve deficit.  Skin: Skin is warm and dry. No erythema.  Psychiatric: She has a normal mood and affect. Her behavior is normal. Judgment normal.     Female chaperone present for pelvic  and breast  portions of the physical exam  Results:   Office Visit from 01/01/2018 in [facility]  AUDIT-C Score  0     Depression screen [facility] 2/9 01/01/2018  Decreased Interest 0  Down, Depressed, Hopeless 0  PHQ - 2 Score 0  Altered sleeping 0  Tired, decreased energy 0  Change in appetite 0  Feeling bad or failure about yourself  0  Trouble concentrating 0  Moving slowly or fidgety/restless 0  Suicidal thoughts 0  PHQ-9 Score 0  Difficult doing work/chores Not difficult at all   Assessment and Plan:  67 y.o. [id number] female here for routine annual gynecologic examination  Plan: Problem List Items Addressed This Visit    None      Screening: -- Blood pressure screen managed by PCP -- Colonoscopy - not due -- Mammogram - Due. Scheduled for tomorrow -- Weight screening: normal -- Depression screening negative (PHQ-9) -- Nutrition: normal -- cholesterol screening: per PCP -- osteoporosis screening: last DEXA 3 years ago and was borderline. May consider another. But, will defer to her PCP -- tobacco screening: not using -- alcohol screening: AUDIT questionnaire indicates low-risk usage. -- family history of breast cancer screening: done. not at high risk. -- no evidence of domestic violence or intimate partner violence. -- STD screening: gonorrhea/chlamydia NAAT not collected per patient request. -- pap smear collected per ASCCP guidelines -- HPV vaccination series: not eligilbe   Vulvar dermatitis: Discussed that this rash was present 1 year ago. She states that she believes it may be related to the soap she uses. We discussed that if this continues, I would want to biopsy the area to assess for one of the vulvar dermatoses that could benefit from treatment. Therefore, I will see her back in 4-6 months to reassess. In the mean time, I asked her to refrain from using soap on the area and to use a topical emollient 2-3  times daily (e.g. Aquaphor).  She voiced  understanding and will try these methods.    Return in about 4 months (around 05/03/2018) for follow up skin rash.  Prentice Docker, MD 01/01/2018 2:45 PM

## 2018-01-02 ENCOUNTER — Ambulatory Visit
Admission: RE | Admit: 2018-01-02 | Discharge: 2018-01-02 | Disposition: A | Discharge status: Home / Self Care | Payer: Medicare Other | Source: Ambulatory Visit | Attending: Primary Care | Admitting: Primary Care

## 2018-01-02 DIAGNOSIS — Z1231 Encounter for screening mammogram for malignant neoplasm of breast: Secondary | ICD-10-CM | POA: Diagnosis not present

## 2018-01-02 LAB — CYTOLOGY - PAP: Diagnosis: NEGATIVE

## 2018-01-04 LAB — URINE CULTURE

## 2018-01-08 ENCOUNTER — Other Ambulatory Visit: Payer: Self-pay | Admitting: Obstetrics and Gynecology

## 2018-01-08 DIAGNOSIS — N3000 Acute cystitis without hematuria: Secondary | ICD-10-CM

## 2018-01-08 MED ORDER — SULFAMETHOXAZOLE-TRIMETHOPRIM 800-160 MG PO TABS: 1.0000 | ORAL_TABLET | Freq: Two times a day (BID) | ORAL | 0 refills | Status: AC

## 2018-01-08 NOTE — Progress Notes (Signed)
Would you mind calling this patient to let her know that her urine culture returned as positive?  I will send in a prescription for her to be treated.  Thank you!

## 2018-01-08 NOTE — Progress Notes (Signed)
Pt aware via voicemail 

## 2018-01-25 ENCOUNTER — Other Ambulatory Visit: Payer: Self-pay | Admitting: Primary Care

## 2018-01-31 ENCOUNTER — Other Ambulatory Visit: Payer: Self-pay | Admitting: Primary Care

## 2018-01-31 DIAGNOSIS — E785 Hyperlipidemia, unspecified: Secondary | ICD-10-CM

## 2018-01-31 DIAGNOSIS — R7303 Prediabetes: Secondary | ICD-10-CM

## 2018-01-31 DIAGNOSIS — I10 Essential (primary) hypertension: Secondary | ICD-10-CM

## 2018-02-04 ENCOUNTER — Other Ambulatory Visit (INDEPENDENT_AMBULATORY_CARE_PROVIDER_SITE_OTHER): Payer: Medicare Other

## 2018-02-04 DIAGNOSIS — I10 Essential (primary) hypertension: Secondary | ICD-10-CM

## 2018-02-04 DIAGNOSIS — R7303 Prediabetes: Secondary | ICD-10-CM | POA: Diagnosis not present

## 2018-02-04 DIAGNOSIS — E785 Hyperlipidemia, unspecified: Secondary | ICD-10-CM

## 2018-02-04 LAB — BASIC METABOLIC PANEL
BUN: 13 mg/dL (ref 6–23)
CO2: 28 mEq/L (ref 19–32)
Calcium: 9.1 mg/dL (ref 8.4–10.5)
Chloride: 107 mEq/L (ref 96–112)
Creatinine, Ser: 0.98 mg/dL (ref 0.40–1.20)
GFR: 60.45 mL/min (ref >60.00)
Glucose, Bld: 111 mg/dL — ABNORMAL HIGH (ref 70–99)
Potassium: 3.9 mEq/L (ref 3.5–5.1)
Sodium: 141 mEq/L (ref 135–145)

## 2018-02-04 LAB — LIPID PANEL
Cholesterol: 131 mg/dL (ref 0–200)
HDL: 32 mg/dL — ABNORMAL LOW (ref >39.00)
LDL Cholesterol: 80 mg/dL (ref 0–99)
NonHDL: 98.99
Total CHOL/HDL Ratio: 4
Triglycerides: 94 mg/dL (ref 0.0–149.0)
VLDL: 18.8 mg/dL (ref 0.0–40.0)

## 2018-02-04 LAB — HEMOGLOBIN A1C: Hgb A1c MFr Bld: 5.5 % (ref 4.6–6.5)

## 2018-02-07 DIAGNOSIS — H6693 Otitis media, unspecified, bilateral: Secondary | ICD-10-CM | POA: Diagnosis not present

## 2018-02-07 DIAGNOSIS — J019 Acute sinusitis, unspecified: Secondary | ICD-10-CM | POA: Diagnosis not present

## 2018-02-07 DIAGNOSIS — J209 Acute bronchitis, unspecified: Secondary | ICD-10-CM | POA: Diagnosis not present

## 2018-02-11 ENCOUNTER — Encounter: Payer: Self-pay | Admitting: Primary Care

## 2018-02-11 ENCOUNTER — Ambulatory Visit (INDEPENDENT_AMBULATORY_CARE_PROVIDER_SITE_OTHER): Payer: Medicare Other | Admitting: Primary Care

## 2018-02-11 VITALS — BP 118/78 | HR 61 | Temp 98.0°F | Ht 65.0 in | Wt 146.0 lb

## 2018-02-11 DIAGNOSIS — E785 Hyperlipidemia, unspecified: Secondary | ICD-10-CM | POA: Diagnosis not present

## 2018-02-11 DIAGNOSIS — J309 Allergic rhinitis, unspecified: Secondary | ICD-10-CM

## 2018-02-11 DIAGNOSIS — Z23 Encounter for immunization: Secondary | ICD-10-CM | POA: Diagnosis not present

## 2018-02-11 DIAGNOSIS — C541 Malignant neoplasm of endometrium: Secondary | ICD-10-CM

## 2018-02-11 DIAGNOSIS — M8580 Other specified disorders of bone density and structure, unspecified site: Secondary | ICD-10-CM

## 2018-02-11 DIAGNOSIS — K2100 Gastro-esophageal reflux disease with esophagitis, without bleeding: Secondary | ICD-10-CM

## 2018-02-11 DIAGNOSIS — E2839 Other primary ovarian failure: Secondary | ICD-10-CM

## 2018-02-11 DIAGNOSIS — K219 Gastro-esophageal reflux disease without esophagitis: Secondary | ICD-10-CM | POA: Diagnosis not present

## 2018-02-11 DIAGNOSIS — F411 Generalized anxiety disorder: Secondary | ICD-10-CM | POA: Diagnosis not present

## 2018-02-11 DIAGNOSIS — Z0000 Encounter for general adult medical examination without abnormal findings: Secondary | ICD-10-CM | POA: Diagnosis not present

## 2018-02-11 DIAGNOSIS — I10 Essential (primary) hypertension: Secondary | ICD-10-CM | POA: Diagnosis not present

## 2018-02-11 DIAGNOSIS — R7303 Prediabetes: Secondary | ICD-10-CM | POA: Diagnosis not present

## 2018-02-11 MED ORDER — RANITIDINE HCL 150 MG PO TABS: ORAL_TABLET | ORAL | 3 refills | Status: DC

## 2018-02-11 MED ORDER — ATORVASTATIN CALCIUM 10 MG PO TABS: ORAL_TABLET | ORAL | 3 refills | Status: DC

## 2018-02-11 MED ORDER — METOPROLOL SUCCINATE ER 50 MG PO TB24: ORAL_TABLET | ORAL | 3 refills | Status: DC

## 2018-02-11 MED ORDER — LEVOCETIRIZINE DIHYDROCHLORIDE 5 MG PO TABS: ORAL_TABLET | ORAL | 0 refills | Status: DC

## 2018-02-11 MED ORDER — LOSARTAN POTASSIUM 100 MG PO TABS: ORAL_TABLET | ORAL | 3 refills | Status: DC

## 2018-02-11 NOTE — Assessment & Plan Note (Signed)
Recent A1C unremarkable. Commended her on weight loss through activity and improvement in diet.  Will continue to monitor.

## 2018-02-11 NOTE — Assessment & Plan Note (Signed)
Denies concerns for anxiety/depression. 

## 2018-02-11 NOTE — Addendum Note (Signed)
Addended by: Jacqualin Combes on: 02/11/2018 05:29 PM   Modules accepted: Orders

## 2018-02-11 NOTE — Assessment & Plan Note (Signed)
Repeat bone density due, orders placed. Discussed to start taking calcium and vitamin d routinely.

## 2018-02-11 NOTE — Assessment & Plan Note (Signed)
Recent lipid panel stable. Continue atorvastatin 10 mg.

## 2018-02-11 NOTE — Assessment & Plan Note (Signed)
Current symptoms likely related to allergies not sinusitis. Exam overall stable. Discussed use of Flonase and antihistamine.

## 2018-02-11 NOTE — Progress Notes (Signed)
Patient ID: Valerie [name], female   DOB: 1950-12-09, 67 y.o.   MRN: [id number]  Valerie [name] is a 67 year old female who presents today for Welcome to Medicare Visit.  HPI:  Past Medical History:  Diagnosis Date  . Acid reflux   . Adenocarcinoma of the endometrium/uterus ([place]) 12/04/2000  . Anal fissure   . Atrophic vaginitis   . Esophagitis   . Hernia    Present to proximal to umbillical   . Hypertension   . Internal hemorrhoids   . Menorrhagia   . Osteopenia   . PONV (postoperative nausea and vomiting)   . Vertigo   . Vitamin D deficiency     Current Outpatient Medications  Medication Sig Dispense Refill  . aspirin EC 81 MG tablet Take 81 mg by mouth daily.    Marland Kitchen atorvastatin (LIPITOR) 10 MG tablet Take 1 tablet (10 mg total) by mouth daily. 30 tablet 0  . Calcium Carbonate-Vit D-Min (CALTRATE 600+D PLUS PO) Take 1 tablet by mouth 2 (two) times daily.      . fluticasone (FLONASE) 50 MCG/ACT nasal spray Place 2 sprays into both nostrils daily as needed for allergies. 16 g 2  . levocetirizine (XYZAL) 5 MG tablet Take 1 tablet by mouth every evening for allergies. 90 tablet 0  . losartan (COZAAR) 100 MG tablet Take 1 tablet (100 mg total) by mouth daily. 90 tablet 3  . metoprolol succinate (TOPROL-XL) 50 MG 24 hr tablet TAKE 1 TABLET BY MOUTH ONCE DAILY (TAKE  WITH  OR  IMMEDIATELY  FOLLOWING  A  MEAL) 90 tablet 1  . nitroGLYCERIN (NITROGLYN) 2 % ointment Apply 1 inch topically 4 (four) times daily as needed for chest pain.    . pantoprazole (PROTONIX) 40 MG tablet TAKE 1 TABLET BY MOUTH DAILY FOR HEARTBURN 90 tablet 0  . ranitidine (ZANTAC) 150 MG tablet Take 1 tablet (150 mg total) by mouth daily. 90 tablet 0  . tiZANidine (ZANAFLEX) 4 MG tablet Take 1 tablet (4 mg total) by mouth every 8 (eight) hours as needed for muscle spasms. 30 tablet 0   No current facility-administered medications for this visit.     No Known Allergies  Family History  Problem Relation Age of Onset  .  Cancer Father        prostate cancer  . Hypertension Father   . Hypertension Sister   . Diabetes Brother   . Heart disease Brother        Deceased   . Hypertension Brother   . Diabetes Brother   . Heart disease Brother        Deceased  . Breast cancer Neg Hx     Social History   Socioeconomic History  . Marital status: Married    Spouse name: Not on file  . Number of children: Not on file  . Years of education: Not on file  . Highest education level: Not on file  Occupational History  . Not on file  Social Needs  . Financial resource strain: Not on file  . Food insecurity:    Worry: Not on file    Inability: Not on file  . Transportation needs:    Medical: Not on file    Non-medical: Not on file  Tobacco Use  . Smoking status: Never Smoker  . Smokeless tobacco: Never Used  Substance and Sexual Activity  . Alcohol use: No  . Drug use: Not on file  . Sexual activity: Yes  Birth control/protection: Post-menopausal  Lifestyle  . Physical activity:    Days per week: 7 days    Minutes per session: Not on file  . Stress: Not on file  Relationships  . Social connections:    Talks on phone: Not on file    Gets together: Not on file    Attends religious service: Not on file    Active member of club or organization: Not on file    Attends meetings of clubs or organizations: Not on file    Relationship status: Not on file  . Intimate partner violence:    Fear of current or ex partner: Not on file    Emotionally abused: Not on file    Physically abused: Not on file    Forced sexual activity: Not on file  Other Topics Concern  . 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    Hospitiliaztions: None  Health Maintenance:    Flu: Due  Tetanus: Completed in 2017  Pneumovax: Completed a pneumonia vaccination in 2018  Prevnar: Completed a pneumonia  vaccination in 2018  Zostavax: Completed in 2017  Bone Density: Due  Colonoscopy: Completed in 2011  Eye Doctor: Completed in 2019  Dental Exam: No recent exam  Mammogram: Completed in August 2019  Hep C Screening: Negative in 2017    Providers: Alma Friendly, PCP   I have personally reviewed and have noted: 1. The patient's medical and social history 2. Their use of alcohol, tobacco or illicit drugs 3. Their current medications and supplements 4. The patient's functional ability including ADL's, fall risks, home safety risks and  hearing or visual impairment. 5. Diet and physical activities 6. Evidence for depression or mood disorder  Subjective:   Review of Systems:   Constitutional: Denies fever, malaise, fatigue, headache or abrupt weight changes.  HEENT: Denies eye pain, eye redness, ear pain, ringing in the ears, wax buildup, bloody nose, or sore throat. She endorsed runny nose and nasal congestion x 10 days. Saw PA at urgent care last week and was given antibiotics. Overall no improvement. She has been outdoors frequently moving the lawn. Respiratory: Denies difficulty breathing, shortness of breath, cough or sputum production.   Cardiovascular: Denies chest pain, chest tightness, palpitations or swelling in the hands or feet.  Gastrointestinal: Denies abdominal pain, bloating, constipation, diarrhea or blood in the stool. Does experience intermittent reflux with epigastric pain, belching, esophageal burning.  GU: Denies urgency, frequency, pain with urination, burning sensation, blood in urine, odor or discharge.  Musculoskeletal: Denies decrease in range of motion, difficulty with gait, muscle pain or joint pain and swelling.  Skin: Denies redness, rashes, lesions or ulcercations.  Neurological: Denies dizziness, difficulty with memory, difficulty with speech or problems with balance and coordination.  Psychiatric: Denies concerns for anxiety or depression.   No other  specific complaints in a complete review of systems (except as listed in HPI above).  Objective:  PE:   BP 118/78   Pulse 61   Temp 98 F (36.7 C) (Oral)   Ht 5\' 5"  (1.651 m)   Wt 146 lb (66.2 kg)   SpO2 99%   BMI 24.30 kg/m  Wt Readings from Last 3 Encounters:  02/11/18 146 lb (66.2 kg)  01/01/18 150 lb 4 oz (68.2 kg)  08/29/17 160 lb 8 oz (72.8 kg)  General: Appears their stated age, well developed, well nourished in NAD. Skin: Warm, dry and intact. No rashes, lesions or ulcerations noted. HEENT: Head: normal shape and size; Eyes: sclera white, no icterus, conjunctiva pink, PERRLA and EOMs intact; Ears: Tm's mild bulging bilaterally without erythema, small effusion to left TM; Nose: mucosa pink and moist, mild mucosal edema, septum midline; Throat/Mouth: Teeth present, mucosa pink and moist, no exudate, lesions or ulcerations noted.  Neck: Normal range of motion. Neck supple, trachea midline. No massses, lumps or thyromegaly present.  Cardiovascular: Normal rate and rhythm. S1,S2 noted.  No murmur, rubs or gallops noted. No JVD or BLE edema. No carotid bruits noted. Pulmonary/Chest: Normal effort and positive vesicular breath sounds. No respiratory distress. No wheezes, rales or ronchi noted.  Abdomen: Soft and nontender. Normal bowel sounds, no bruits noted. No distention or masses noted. Liver, spleen and kidneys non palpable. Musculoskeletal: Normal range of motion. No signs of joint swelling. No difficulty with gait.  Neurological: Alert and oriented. Cranial nerves II-XII intact. Coordination normal. +DTRs bilaterally. Psychiatric: Mood and affect normal. Behavior is normal. Judgment and thought content normal.    BMET    Component Value Date/Time   NA 141 02/04/2018 0823   NA 138 07/31/2013 1727   K 3.9 02/04/2018 0823   K 3.7 07/31/2013 1727   CL 107 02/04/2018 0823   CL 105 07/31/2013 1727   CO2 28 02/04/2018 0823   CO2 31 07/31/2013 1727   GLUCOSE 111 (H)  02/04/2018 0823   GLUCOSE 141 (H) 07/31/2013 1727   BUN 13 02/04/2018 0823   BUN 15 07/31/2013 1727   CREATININE 0.98 02/04/2018 0823   CREATININE 0.96 07/31/2013 1727   CALCIUM 9.1 02/04/2018 0823   CALCIUM 9.0 07/31/2013 1727   GFRNONAA >60 05/18/2017 0220   GFRNONAA >60 07/31/2013 1727   GFRAA >60 05/18/2017 0220   GFRAA >60 07/31/2013 1727    Lipid Panel     Component Value Date/Time   CHOL 131 02/04/2018 0823   TRIG 94.0 02/04/2018 0823   HDL 32.00 (L) 02/04/2018 0823   CHOLHDL 4 02/04/2018 0823   VLDL 18.8 02/04/2018 0823   LDLCALC 80 02/04/2018 0823    CBC    Component Value Date/Time   WBC 4.5 08/22/2017 0911   RBC 4.32 08/22/2017 0911   HGB 13.8 08/22/2017 0911   HGB 13.3 07/31/2013 1727   HCT 41.1 08/22/2017 0911   HCT 38.1 07/31/2013 1727   PLT 246.0 08/22/2017 0911   PLT 225 07/31/2013 1727   MCV 95.2 08/22/2017 0911   MCV 95 07/31/2013 1727   MCH 31.4 05/18/2017 0220   MCHC 33.6 08/22/2017 0911   RDW 13.9 08/22/2017 0911   RDW 14.0 07/31/2013 1727   LYMPHSABS 1.4 08/22/2017 0911   MONOABS 0.4 08/22/2017 0911   EOSABS 0.1 08/22/2017 0911   BASOSABS 0.0 08/22/2017 0911    Hgb A1C Lab Results  Component Value Date   HGBA1C 5.5 02/04/2018      Assessment and Plan:   Medicare Annual Wellness Visit:  Diet: She endorses a healthy diet Breakfast: Sugar free jello, eggs Lunch: meat, sandwish Dinner: Sometimes skips, crackers, sandwich, meat, vegetables, starch, beans Snacks: None Desserts: Sugar free candy. 2-3 times weekly  Beverages: Un-sweet tea, water, occasional diet soda Physical activity: Active, she is not exercising Depression/mood screen: Negative Hearing: Intact to whispered voice Visual acuity: Grossly normal, performs annual eye exam  ADLs: Capable Fall risk: None Home safety: Good Cognitive evaluation: Intact to  orientation, naming, recall and repetition EOL planning: Adv directives not done.  Full code/ I  agree  Preventative Medicine: Influenza vaccination provided today. She endorses receiving a pneumonia vaccination from [facility] [facility] in 2018, she will call to notify of the type and date. All other imunizations UTD. Colonoscopy due in 2021. Bone density due, pending. Mammogram UTD. Advanced directive handout provided. All recommendations provided at end of visit. Recommended regular exercise and to work on diet.  Exam overall unremarkable. Labs reviewed.   Next appointment: One year

## 2018-02-11 NOTE — Assessment & Plan Note (Signed)
Following with GYN

## 2018-02-11 NOTE — Assessment & Plan Note (Signed)
Chronic, some breakthrough symptoms with PPI and H2 Blocker. Continue pantoprazole and ranitidine.

## 2018-02-11 NOTE — Patient Instructions (Signed)
Please find out the type and date of your recent pneumonia vaccination.  You will be contacted regarding your bone density. Please let us know if you have not been contacted within one week.   Continue exercising. You should be getting 150 minutes of moderate intensity exercise weekly.  Continue to work on [employer].   We will see you in one year for your annual exam or sooner if needed.  It was a pleasure to see you today!

## 2018-02-11 NOTE — Assessment & Plan Note (Signed)
Influenza vaccination provided today. She endorses receiving a pneumonia vaccination from [facility] in 2018, she will call to notify of the type and date. All other imunizations UTD. Colonoscopy due in 2021. Bone density due, pending. Mammogram UTD. Advanced directive handout provided. All recommendations provided at end of visit. Recommended regular exercise and to work on diet.  I have personally reviewed and have noted: 1. The patient's medical and social history 2. Their use of alcohol, tobacco or illicit drugs 3. Their current medications and supplements 4. The patient's functional ability including ADL's, fall risks, home safety risks and  hearing or visual impairment. 5. Diet and physical activities 6. Evidence for depression or mood disorder

## 2018-02-11 NOTE — Assessment & Plan Note (Signed)
Stable in the office today. Continue current regimen.  BMP reviewed and is unremarkable.

## 2018-03-04 DIAGNOSIS — J019 Acute sinusitis, unspecified: Secondary | ICD-10-CM | POA: Diagnosis not present

## 2018-03-19 ENCOUNTER — Other Ambulatory Visit: Payer: Self-pay | Admitting: Primary Care

## 2018-03-19 DIAGNOSIS — K219 Gastro-esophageal reflux disease without esophagitis: Secondary | ICD-10-CM

## 2018-03-25 ENCOUNTER — Other Ambulatory Visit: Payer: Self-pay | Admitting: Primary Care

## 2018-03-25 DIAGNOSIS — K219 Gastro-esophageal reflux disease without esophagitis: Secondary | ICD-10-CM

## 2018-03-28 ENCOUNTER — Other Ambulatory Visit: Payer: Self-pay | Admitting: Primary Care

## 2018-03-28 DIAGNOSIS — K219 Gastro-esophageal reflux disease without esophagitis: Secondary | ICD-10-CM

## 2018-03-29 ENCOUNTER — Telehealth: Payer: Self-pay | Admitting: Primary Care

## 2018-03-29 NOTE — Telephone Encounter (Signed)
Pt want a refill for omeprazole 40 mg [place]

## 2018-03-29 NOTE — Telephone Encounter (Signed)
I have called patient and she stated that [name] said she can this as well as pantoprazole. Ok to refill?

## 2018-03-29 NOTE — Telephone Encounter (Signed)
Spoken and notified patient of Tawni Millers comments. Patient verbalized understanding.  Patient will try this first.

## 2018-03-29 NOTE — Telephone Encounter (Signed)
Is she taking the ranitidine 150 mg tablets as well as pantoprazole 40 mg?  Has she tried taking ranitidine 150 mg twice daily along with pantoprazole 40 mg? If not then have her do so first.

## 2018-04-03 ENCOUNTER — Other Ambulatory Visit: Payer: Medicare Other

## 2018-04-03 ENCOUNTER — Other Ambulatory Visit: Payer: Self-pay | Admitting: Primary Care

## 2018-04-03 DIAGNOSIS — K219 Gastro-esophageal reflux disease without esophagitis: Secondary | ICD-10-CM

## 2018-04-19 ENCOUNTER — Ambulatory Visit (INDEPENDENT_AMBULATORY_CARE_PROVIDER_SITE_OTHER)
Admission: RE | Admit: 2018-04-19 | Discharge: 2018-04-19 | Disposition: A | Discharge status: Home / Self Care | Payer: Medicare Other | Source: Ambulatory Visit | Attending: Family Medicine | Admitting: Family Medicine

## 2018-04-19 ENCOUNTER — Ambulatory Visit (INDEPENDENT_AMBULATORY_CARE_PROVIDER_SITE_OTHER): Payer: Medicare Other | Admitting: Family Medicine

## 2018-04-19 ENCOUNTER — Encounter: Payer: Self-pay | Admitting: Family Medicine

## 2018-04-19 VITALS — BP 124/82 | HR 68 | Temp 97.8°F | Ht 65.0 in | Wt 143.5 lb

## 2018-04-19 DIAGNOSIS — M546 Pain in thoracic spine: Secondary | ICD-10-CM | POA: Diagnosis not present

## 2018-04-19 DIAGNOSIS — M542 Cervicalgia: Secondary | ICD-10-CM

## 2018-04-19 DIAGNOSIS — Z9889 Other specified postprocedural states: Secondary | ICD-10-CM

## 2018-04-19 DIAGNOSIS — M4322 Fusion of spine, cervical region: Secondary | ICD-10-CM | POA: Diagnosis not present

## 2018-04-19 MED ORDER — PREDNISONE 20 MG PO TABS: ORAL_TABLET | ORAL | 0 refills | Status: DC

## 2018-04-19 MED ORDER — GABAPENTIN 100 MG PO CAPS: 100.0000 mg | ORAL_CAPSULE | Freq: Every day | ORAL | 1 refills | Status: DC

## 2018-04-19 NOTE — Assessment & Plan Note (Signed)
Progressive worsening in h/o cervical spine surgery - check cervical films today.  Anticipate progressive cervical spondylosis, r/o cervical radiculopathy. Rx prednisone course as well as gabapentin with sedation precautions. Discussed avoiding NSAIDs while on prednisone.  Update with effect, consider further imaging (contrasted MR) vs PT course.

## 2018-04-19 NOTE — Progress Notes (Signed)
BP 124/82 (BP Location: Left Arm, Patient Position: Sitting, Cuff Size: Normal)   Pulse 68   Temp 97.8 F (36.6 C) (Oral)   Ht 5\' 5"  (1.651 m)   Wt 143 lb 8 oz (65.1 kg)   SpO2 97%   BMI 23.88 kg/m    CC: neck pain Subjective:    Patient ID: Valerie [name], female    DOB: 11/29/1950, 67 y.o.   MRN: [id number]  HPI: MALLIKA [name] is a 67 y.o. female presenting on 04/19/2018 for Neck Pain (C/o neck pain up to the base of skull. Says it feels like a lot of pressure. Has screws in the neck. Has had x-rays.  Has tried CBD, ibuprofen, Aleve)   3-4 mo h/o neck pain, progressively worsening. Constant R>L neck pain starts at base of neck and travels up to skull. Describes burning and soreness and pressure. Some shooting pain down L>R arm as well as paresthesias of fingers. Some headaches. Denies new injury/trauma or fall. Prolonged standing aggravates pain. Has to go to bed early due to pain.   Has been treating with CBD cream without benefit. Has tried ibuprofen 600mg  and aleve 220mg  TID without benefit.   H/o cervical spine surgery 2013, had screws placed.  H/o chronic neck pain, but recently much worse.  She works one day a week at [employer] - has had difficulty managing work day due to neck pain  Relevant past medical, surgical, family and social history reviewed and updated as indicated. Interim medical history since our last visit reviewed. Allergies and medications reviewed and updated. Outpatient Medications Prior to Visit  Medication Sig Dispense Refill  . aspirin EC 81 MG tablet Take 81 mg by mouth daily.    Marland Kitchen atorvastatin (LIPITOR) 10 MG tablet Take 1 tablet by mouth once daily for cholesterol. 90 tablet 3  . Calcium Carbonate-Vit D-Min (CALTRATE 600+D PLUS PO) Take 1 tablet by mouth 2 (two) times daily.      . fluticasone (FLONASE) 50 MCG/ACT nasal spray Place 2 sprays into both nostrils daily as needed for allergies. 16 g 2  . levocetirizine (XYZAL) 5 MG tablet Take 1  tablet by mouth every evening for allergies. 90 tablet 0  . losartan (COZAAR) 100 MG tablet Take 1 tablet by mouth once daily for blood pressure. 90 tablet 3  . metoprolol succinate (TOPROL-XL) 50 MG 24 hr tablet TAKE 1 TABLET BY MOUTH ONCE DAILY (TAKE  WITH  OR  IMMEDIATELY  FOLLOWING  A  MEAL) 90 tablet 3  . nitroGLYCERIN (NITROGLYN) 2 % ointment Apply 1 inch topically 4 (four) times daily as needed for chest pain.    . pantoprazole (PROTONIX) 40 MG tablet TAKE 1 TABLET BY MOUTH DAILY FOR HEARTBURN 90 tablet 0  . ranitidine (ZANTAC) 150 MG tablet Take 1 tablet by mouth once daily for heartburn. 90 tablet 3  . tiZANidine (ZANAFLEX) 4 MG tablet Take 1 tablet (4 mg total) by mouth every 8 (eight) hours as needed for muscle spasms. 30 tablet 0   No facility-administered medications prior to visit.      Per HPI unless specifically indicated in ROS section below Review of Systems     Objective:    BP 124/82 (BP Location: Left Arm, Patient Position: Sitting, Cuff Size: Normal)   Pulse 68   Temp 97.8 F (36.6 C) (Oral)   Ht 5\' 5"  (1.651 m)   Wt 143 lb 8 oz (65.1 kg)   SpO2 97%  BMI 23.88 kg/m   Wt Readings from Last 3 Encounters:  04/19/18 143 lb 8 oz (65.1 kg)  02/11/18 146 lb (66.2 kg)  01/01/18 150 lb 4 oz (68.2 kg)    Physical Exam  Constitutional: She appears well-developed and well-nourished. No distress.  Musculoskeletal: She exhibits no edema.  FROM at cervical neck with limitations due to pain on lateral flexion and rotation to left Tender to palpation midline cervical spine as well as paraspinous mm Tender to palpation midline upper thoracic spine FROM at shoulders  Neurological: She is alert. She has normal strength. No sensory deficit. Coordination and gait normal.  Neg spurling bilaterally Grip strength intact Sensation intact  Vitals reviewed.     Assessment & Plan:   Problem List Items Addressed This Visit    Neck pain with history of cervical spinal surgery  - Primary    Progressive worsening in h/o cervical spine surgery - check cervical films today.  Anticipate progressive cervical spondylosis, r/o cervical radiculopathy. Rx prednisone course as well as gabapentin with sedation precautions. Discussed avoiding NSAIDs while on prednisone.  Update with effect, consider further imaging (contrasted MR) vs PT course.      Relevant Orders   DG Cervical Spine Complete   DG Thoracic Spine W/Swimmers       Meds ordered this encounter  Medications  . predniSONE (DELTASONE) 20 MG tablet    Sig: Take two tablets daily for 3 days followed by one tablet daily for 4 days    Dispense:  10 tablet    Refill:  0  . gabapentin (NEURONTIN) 100 MG capsule    Sig: Take 1-2 capsules (100-200 mg total) by mouth at bedtime. Sedation precautions    Dispense:  90 capsule    Refill:  1   Orders Placed This Encounter  Procedures  . DG Cervical Spine Complete    Standing Status:   Future    Number of Occurrences:   1    Standing Expiration Date:   06/20/2019    Order Specific Question:   Reason for Exam (SYMPTOM  OR DIAGNOSIS REQUIRED)    Answer:   chronic and worsening neck pain in h/o surgery    Order Specific Question:   Preferred imaging location?    Answer:   [facility]    Order Specific Question:   Radiology Contrast Protocol - do NOT remove file path    Answer:   \\[email or web address]  . DG Thoracic Spine W/Swimmers    Standing Status:   Future    Number of Occurrences:   1    Standing Expiration Date:   06/20/2019    Order Specific Question:   Reason for Exam (SYMPTOM  OR DIAGNOSIS REQUIRED)    Answer:   cervical and thoracic neck pain after surgery    Order Specific Question:   Preferred imaging location?    Answer:   [facility]    Order Specific Question:   Radiology Contrast Protocol - do NOT remove file path    Answer:   \\[email or web address]     Follow up plan: No follow-ups on file.  Ria Bush, MD

## 2018-04-19 NOTE — Patient Instructions (Addendum)
I think you have worsening cervical neck pain, possibly from worsening arthritis.  Neck xrays today - we will be in touch with results. Take prednisone taper (don't take with ibuprofen or aleve).  Try gabapentin nerve pain pill 1-2 at night. If tolerated, may increase to 1-2 tablets twice daily. Let us know how you're doing. Let us know if worsening pain despite above treatment.

## 2018-04-28 ENCOUNTER — Other Ambulatory Visit: Payer: Self-pay | Admitting: Family Medicine

## 2018-04-28 ENCOUNTER — Encounter: Payer: Self-pay | Admitting: Family Medicine

## 2018-04-28 DIAGNOSIS — Z9889 Other specified postprocedural states: Principal | ICD-10-CM

## 2018-04-28 DIAGNOSIS — M542 Cervicalgia: Secondary | ICD-10-CM

## 2018-05-02 DIAGNOSIS — M542 Cervicalgia: Secondary | ICD-10-CM | POA: Diagnosis not present

## 2018-05-03 ENCOUNTER — Ambulatory Visit: Payer: Self-pay | Admitting: Obstetrics and Gynecology

## 2018-05-07 ENCOUNTER — Ambulatory Visit
Admission: RE | Admit: 2018-05-07 | Discharge: 2018-05-07 | Disposition: A | Discharge status: Home / Self Care | Payer: Medicare Other | Source: Ambulatory Visit | Attending: Primary Care | Admitting: Primary Care

## 2018-05-07 DIAGNOSIS — Z780 Asymptomatic menopausal state: Secondary | ICD-10-CM | POA: Diagnosis not present

## 2018-05-07 DIAGNOSIS — E2839 Other primary ovarian failure: Secondary | ICD-10-CM | POA: Diagnosis not present

## 2018-05-07 DIAGNOSIS — M85851 Other specified disorders of bone density and structure, right thigh: Secondary | ICD-10-CM | POA: Diagnosis not present

## 2018-05-08 DIAGNOSIS — M50223 Other cervical disc displacement at C6-C7 level: Secondary | ICD-10-CM | POA: Diagnosis not present

## 2018-05-08 DIAGNOSIS — M542 Cervicalgia: Secondary | ICD-10-CM | POA: Diagnosis not present

## 2018-05-14 DIAGNOSIS — I739 Peripheral vascular disease, unspecified: Secondary | ICD-10-CM | POA: Diagnosis not present

## 2018-05-14 DIAGNOSIS — M542 Cervicalgia: Secondary | ICD-10-CM | POA: Diagnosis not present

## 2018-05-15 ENCOUNTER — Telehealth (HOSPITAL_COMMUNITY): Payer: Self-pay | Admitting: *Deleted

## 2018-05-15 NOTE — Telephone Encounter (Signed)
Left VM to schedule with patient

## 2018-05-20 ENCOUNTER — Other Ambulatory Visit (HOSPITAL_COMMUNITY): Payer: Self-pay | Admitting: Neurosurgery

## 2018-05-20 DIAGNOSIS — I739 Peripheral vascular disease, unspecified: Secondary | ICD-10-CM

## 2018-05-23 ENCOUNTER — Ambulatory Visit (HOSPITAL_COMMUNITY)
Admission: RE | Admit: 2018-05-23 | Discharge: 2018-05-23 | Disposition: A | Discharge status: Home / Self Care | Payer: Medicare Other | Source: Ambulatory Visit | Attending: Neurosurgery | Admitting: Neurosurgery

## 2018-05-23 ENCOUNTER — Ambulatory Visit (HOSPITAL_COMMUNITY): Payer: Medicare Other

## 2018-05-23 DIAGNOSIS — I739 Peripheral vascular disease, unspecified: Secondary | ICD-10-CM | POA: Insufficient documentation

## 2018-05-27 DIAGNOSIS — M5412 Radiculopathy, cervical region: Secondary | ICD-10-CM | POA: Diagnosis not present

## 2018-05-27 DIAGNOSIS — M4802 Spinal stenosis, cervical region: Secondary | ICD-10-CM | POA: Diagnosis not present

## 2018-06-03 ENCOUNTER — Telehealth: Payer: Self-pay

## 2018-06-03 DIAGNOSIS — K2100 Gastro-esophageal reflux disease with esophagitis, without bleeding: Secondary | ICD-10-CM

## 2018-06-03 DIAGNOSIS — E785 Hyperlipidemia, unspecified: Secondary | ICD-10-CM

## 2018-06-03 DIAGNOSIS — I10 Essential (primary) hypertension: Secondary | ICD-10-CM

## 2018-06-03 MED ORDER — METOPROLOL SUCCINATE ER 50 MG PO TB24: ORAL_TABLET | ORAL | 2 refills | Status: DC

## 2018-06-03 MED ORDER — RANITIDINE HCL 150 MG PO TABS: ORAL_TABLET | ORAL | 2 refills | Status: DC

## 2018-06-03 MED ORDER — LOSARTAN POTASSIUM 100 MG PO TABS: ORAL_TABLET | ORAL | 2 refills | Status: DC

## 2018-06-03 MED ORDER — ATORVASTATIN CALCIUM 10 MG PO TABS: ORAL_TABLET | ORAL | 2 refills | Status: DC

## 2018-06-03 NOTE — Telephone Encounter (Addendum)
Pt has new ins this year and wants Atorvastatin 10 mg, losartan 100 mg, metoprolol 50 mg and ranitidine 150 mg sent to mail order pharmacy. Pt has set up acct with new mail order pharmacy but will cb with pharmacy information (name address and phone #).pt called back with a PO box for [facility] that was not in out system, I called [phone] and spoke with [facility] and she said the correct mail order address is the [facility] address. Will send refills per protocol to this [facility] and pt notified done. Pt said OK to cancel these 4 meds at [address]; pt has enough med to last until gets mail order med. Spoke with Theadora Rama at [address] and cancelled atorvastatin,losartan,metoprolol and ranitidine.

## 2018-08-13 ENCOUNTER — Ambulatory Visit (INDEPENDENT_AMBULATORY_CARE_PROVIDER_SITE_OTHER): Payer: Medicare HMO | Admitting: Primary Care

## 2018-08-13 ENCOUNTER — Other Ambulatory Visit: Payer: Self-pay

## 2018-08-13 ENCOUNTER — Encounter: Payer: Self-pay | Admitting: Primary Care

## 2018-08-13 VITALS — BP 118/78 | HR 63 | Temp 98.4°F | Ht 65.0 in | Wt 145.2 lb

## 2018-08-13 DIAGNOSIS — B9789 Other viral agents as the cause of diseases classified elsewhere: Secondary | ICD-10-CM

## 2018-08-13 DIAGNOSIS — J309 Allergic rhinitis, unspecified: Secondary | ICD-10-CM | POA: Diagnosis not present

## 2018-08-13 DIAGNOSIS — J019 Acute sinusitis, unspecified: Secondary | ICD-10-CM | POA: Diagnosis not present

## 2018-08-13 MED ORDER — PREDNISONE 20 MG PO TABS: ORAL_TABLET | ORAL | 0 refills | Status: DC

## 2018-08-13 NOTE — Patient Instructions (Addendum)
Start prednisone tablets for sinus pressure/allergies. Take 2 tablets for 3 days, then 1 tablet for 4 days.  Hold your Flonase for now.  Continue Zyrtec daily.  Please notify me in one week if no improvement.  It was a pleasure to see you today!

## 2018-08-13 NOTE — Progress Notes (Signed)
Subjective:    Patient ID: Valerie [name], female    DOB: [birth year], 68 y.o.   MRN: [id number]  HPI  Ms. Hollman is a 68 year old female with a history of hypertension, allergic rhinitis, GERD, BPPV who presents today with a chief complaint of sore throat.  She was initially evaluated at Urgent Care through [facility] on 07/26/18 with a one week history of cough, sore throat, rhinorrhea, bilateral ear pain. She was diagnosed with acute bacterial sinusitis, acute otitis medica, and acute bacterial conjunctivitis to both eyes. She was treated with azithromycin, phenergan with codeine, Mucinex,and sulfacetamide ophthalmic eye drops.  She returned to [facility] on 08/05/18 with continued nasal congestion, post nasal drip, facial pressure, productive cough. Her ear and eye symptoms had resolved. She was diagnosed again with acute bacterial sinusitis and was treated with cefdinir antibiotics for a seven day course, benzonatate capsules, and Flonase. It was advised that she see ENT if symptoms persisted.  Since her last Urgent Care visit she's experiencing bilateral frontal lobe headache, sore throat, post nasal drip, ear irritation. She denies cough, fevers. Overall she doesn't feel much better. She is compliant to her Flonase, cefdinir, Zyrtec.   Review of Systems  Constitutional: Negative for fever.  HENT: Positive for congestion, ear pain, postnasal drip, sinus pressure and sinus pain.   Respiratory: Negative for cough and shortness of breath.   Allergic/Immunologic: Positive for environmental allergies.       Past Medical History:  Diagnosis Date   Acid reflux    Adenocarcinoma of the endometrium/uterus ([place]) 12/04/2000   Anal fissure    Atrophic vaginitis    Esophagitis    Hernia    Present to proximal to umbillical    Hypertension    Internal hemorrhoids    Menorrhagia    Osteopenia    PONV (postoperative nausea and vomiting)    Vertigo    Vitamin D deficiency       Social History   Socioeconomic History   Marital status: Married    Spouse name: Not on file   Number of children: Not on file   Years of education: Not on file   Highest education level: Not on file  Occupational History   Not on file  Social Needs   Financial resource strain: Not on file   Food insecurity:    Worry: Not on file    Inability: Not on file   Transportation needs:    Medical: Not on file    Non-medical: Not on file  Tobacco Use   Smoking status: Never Smoker   Smokeless tobacco: Never Used  Substance and Sexual Activity   Alcohol use: No   Drug use: Not on file   Sexual activity: Yes    Birth control/protection: Post-menopausal  Lifestyle   Physical activity:    Days per week: 7 days    Minutes per session: Not on file   Stress: Not on file  Relationships   Social connections:    Talks on phone: Not on file    Gets together: Not on file    Attends religious service: Not on file    Active member of club or organization: Not on file    Attends meetings of clubs or organizations: Not on file    Relationship status: Not on file   Intimate partner violence:    Fear of current or ex partner: Not on file    Emotionally abused: Not on file    Physically  abused: Not on file    Forced sexual activity: Not on file  Other Topics Concern   Not on file  Social History Narrative   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    Past Surgical History:  Procedure Laterality Date   77 HOUR [place] STUDY N/A 11/29/2015   Procedure: 24 HOUR [place] STUDY;  Surgeon: Manus Gunning, MD;  Location: [facility];  Service: Gastroenterology;  Laterality: N/A;   ABDOMINAL HYSTERECTOMY  12/04/2000   also bilateral salpingo-oophorectomy   CARDIAC CATHETERIZATION  2006   CARPAL TUNNEL RELEASE  11/20/2011   Procedure: CARPAL TUNNEL RELEASE;  Surgeon: Elaina Hoops, MD;   Location: [place] [facility];  Service: Neurosurgery;  Laterality: Left;  LEFT carpal tunnel release   CERVICAL SPINE SURGERY  2013   [place] AND CURETTAGE OF UTERUS     11/15/2000   ESOPHAGEAL MANOMETRY N/A 09/20/2015   Procedure: ESOPHAGEAL MANOMETRY (EM);  Surgeon: Manus Gunning, MD;  Location: [facility];  Service: Gastroenterology;  Laterality: N/A;   HYSTEROSCOPY  11/15/2000   D & C, resection of endometrial polyps   OOPHORECTOMY  12/04/2000   bilateral salpingo-oophorectomy done with TAH   TUBAL LIGATION      Family History  Problem Relation Age of Onset   Cancer Father        prostate cancer   Hypertension Father    Hypertension Sister    Diabetes Brother    Heart disease Brother        Deceased    Hypertension Brother    Diabetes Brother    Heart disease Brother        Deceased   Breast cancer Neg Hx     No Known Allergies  Current Outpatient Medications on File Prior to Visit  Medication Sig Dispense Refill   aspirin EC 81 MG tablet Take 81 mg by mouth daily.     atorvastatin (LIPITOR) 10 MG tablet Take 1 tablet by mouth once daily for cholesterol. 90 tablet 2   Calcium Carbonate-Vit D-Min (CALTRATE 600+D PLUS PO) Take 1 tablet by mouth 2 (two) times daily.       fluticasone (FLONASE) 50 MCG/ACT nasal spray Place 2 sprays into both nostrils daily as needed for allergies. 16 g 2   gabapentin (NEURONTIN) 100 MG capsule Take 1-2 capsules (100-200 mg total) by mouth at bedtime. Sedation precautions 90 capsule 1   levocetirizine (XYZAL) 5 MG tablet Take 1 tablet by mouth every evening for allergies. 90 tablet 0   losartan (COZAAR) 100 MG tablet Take 1 tablet by mouth once daily for blood pressure. 90 tablet 2   metoprolol succinate (TOPROL-XL) 50 MG 24 hr tablet TAKE 1 TABLET BY MOUTH ONCE DAILY (TAKE  WITH  OR  IMMEDIATELY  FOLLOWING  A  MEAL) 90 tablet 2   nitroGLYCERIN (NITROGLYN) 2 % ointment Apply 1 inch  topically 4 (four) times daily as needed for chest pain.     pantoprazole (PROTONIX) 40 MG tablet TAKE 1 TABLET BY MOUTH DAILY FOR HEARTBURN 90 tablet 0   ranitidine (ZANTAC) 150 MG tablet Take 1 tablet by mouth once daily for heartburn. 90 tablet 2   tiZANidine (ZANAFLEX) 4 MG tablet Take 1 tablet (4 mg total) by mouth every 8 (eight) hours as needed for muscle spasms. 30 tablet 0   No current  facility-administered medications on file prior to visit.     BP 118/78    Pulse 63    Temp 98.4 F (36.9 C) (Oral)    Ht 5\' 5"  (1.651 m)    Wt 145 lb 4 oz (65.9 kg)    SpO2 96%    BMI 24.17 kg/m    Objective:   Physical Exam  Constitutional: She appears well-nourished. She does not appear ill.  HENT:  Right Ear: Ear canal normal. Tympanic membrane is bulging. Tympanic membrane is not erythematous.  Left Ear: Ear canal normal. Tympanic membrane is bulging. Tympanic membrane is not erythematous.  Nose: Mucosal edema present. Right sinus exhibits frontal sinus tenderness. Right sinus exhibits no maxillary sinus tenderness. Left sinus exhibits frontal sinus tenderness. Left sinus exhibits no maxillary sinus tenderness.  Mouth/Throat: Oropharynx is clear and moist.  Neck: Neck supple.  Cardiovascular: Normal rate and regular rhythm.  Respiratory: Effort normal and breath sounds normal. She has no wheezes.  Skin: Skin is warm and dry.           Assessment & Plan:  Viral Sinusitis:  Treated twice since February 28th 2020 with two rounds of antibiotics, no improvement in symptoms.  Exam today consistent for more allergy involvement, and also likely had viral sinusitis. Discussed that in this situation symptoms may last 4-6 weeks before resolve. She exhibits no alarm signs.  Rx for prednisone course sent to pharmacy. Hold Flonase. Continue Zyrtec. Consider ENT evaluation if no improvement.  Pleas Koch, NP

## 2018-08-19 ENCOUNTER — Telehealth: Payer: Self-pay | Admitting: Primary Care

## 2018-08-19 DIAGNOSIS — H9209 Otalgia, unspecified ear: Secondary | ICD-10-CM

## 2018-08-19 NOTE — Telephone Encounter (Signed)
Pt calling and stating she is still having nausea and ear pain and want to know if [name] want to see her again or refer her to ENT. Please call to advise pt.

## 2018-08-20 NOTE — Telephone Encounter (Signed)
Please notify patient that I will refer her to ENT as I really don't have any other ideas for the cause of her symptoms.

## 2018-08-20 NOTE — Telephone Encounter (Signed)
Per DPR, left detail message of Kate Clark's comments for patient. 

## 2018-08-23 DIAGNOSIS — R599 Enlarged lymph nodes, unspecified: Secondary | ICD-10-CM | POA: Diagnosis not present

## 2018-08-23 DIAGNOSIS — H7091 Unspecified mastoiditis, right ear: Secondary | ICD-10-CM | POA: Diagnosis not present

## 2018-08-26 ENCOUNTER — Other Ambulatory Visit: Payer: Self-pay | Admitting: Family

## 2018-08-26 DIAGNOSIS — R599 Enlarged lymph nodes, unspecified: Secondary | ICD-10-CM

## 2018-09-18 ENCOUNTER — Other Ambulatory Visit: Payer: Self-pay

## 2018-09-18 ENCOUNTER — Ambulatory Visit
Admission: RE | Admit: 2018-09-18 | Discharge: 2018-09-18 | Disposition: A | Discharge status: Home / Self Care | Payer: Medicare HMO | Source: Ambulatory Visit | Attending: Family | Admitting: Family

## 2018-09-18 DIAGNOSIS — R599 Enlarged lymph nodes, unspecified: Secondary | ICD-10-CM | POA: Insufficient documentation

## 2018-12-03 ENCOUNTER — Telehealth: Payer: Self-pay | Admitting: *Deleted

## 2018-12-03 ENCOUNTER — Other Ambulatory Visit: Payer: Self-pay | Admitting: Primary Care

## 2018-12-03 DIAGNOSIS — Z1231 Encounter for screening mammogram for malignant neoplasm of breast: Secondary | ICD-10-CM

## 2018-12-03 DIAGNOSIS — K219 Gastro-esophageal reflux disease without esophagitis: Secondary | ICD-10-CM

## 2018-12-03 NOTE — Telephone Encounter (Signed)
Patient called stating that she needs a refill on her Pantoprazole. Patient stated that [facility] told her that they sent a refill request over to the office and never heard anything back on the refill. Patient is requesting that this be sent to [facility]

## 2018-12-04 MED ORDER — PANTOPRAZOLE SODIUM 40 MG PO TBEC: 40.0000 mg | DELAYED_RELEASE_TABLET | Freq: Every day | ORAL | 1 refills | Status: DC

## 2018-12-09 NOTE — Telephone Encounter (Signed)
Please sign and close encounter when completed. 

## 2018-12-10 ENCOUNTER — Ambulatory Visit (INDEPENDENT_AMBULATORY_CARE_PROVIDER_SITE_OTHER): Payer: Medicare HMO | Admitting: Primary Care

## 2018-12-10 ENCOUNTER — Encounter: Payer: Self-pay | Admitting: Primary Care

## 2018-12-10 ENCOUNTER — Other Ambulatory Visit: Payer: Self-pay

## 2018-12-10 VITALS — BP 120/78 | HR 84 | Temp 98.5°F | Ht 65.0 in | Wt 148.5 lb

## 2018-12-10 DIAGNOSIS — M542 Cervicalgia: Secondary | ICD-10-CM | POA: Diagnosis not present

## 2018-12-10 DIAGNOSIS — Z9889 Other specified postprocedural states: Secondary | ICD-10-CM | POA: Diagnosis not present

## 2018-12-10 MED ORDER — CYCLOBENZAPRINE HCL 5 MG PO TABS: 5.0000 mg | ORAL_TABLET | Freq: Two times a day (BID) | ORAL | 0 refills | Status: DC | PRN

## 2018-12-10 NOTE — Progress Notes (Signed)
Subjective:    Patient ID: Valerie [name], female    DOB: 10-Jul-1950, 69 y.o.   MRN: [id number]  HPI  Valerie [name] is a 68 year old female with a history of hypertension, GERD, anxiety disorder, chronic neck pain, vertigo who presents today with a chief complaint of neck pain.  History of cervical spinal fusion to C3-C4 in 2013 per Dr. Saintclair [name]. Evaluated last for cervical spine complaints in November 2019 per Dr. Danise [name]. She underwent plain films of the cervical spine at that time which showed stable hardware.    She was evaluated by Dr. Saintclair [name] in March 2020 and was told that surgical hardware was in place. She was then referred for a cortisone injection for which she received in April 2020. The cortisone injection lasted for three days then her pain returned. She has not yet called pain management to discuss. She describes her pain as sharp, shooting which starts to the upper cervical spine down to lower cervical spine with radiation of pain with numbness down bilateral upper extremities. She endorses that her symptoms have been present since her surgery in 2013. She's been taking Tylenol and Ibuprofen without improvement.  She is pending oral surgery for on 12/12/18 and would like some treatment for her neck during that surgery.    Review of Systems  Musculoskeletal: Positive for arthralgias and neck pain.  Neurological: Positive for numbness. Negative for weakness.       Past Medical History:  Diagnosis Date  . Acid reflux   . Adenocarcinoma of the endometrium/uterus ([place]) 12/04/2000  . Anal fissure   . Atrophic vaginitis   . Esophagitis   . Hernia    Present to proximal to umbillical   . Hypertension   . Internal hemorrhoids   . Menorrhagia   . Osteopenia   . PONV (postoperative nausea and vomiting)   . Vertigo   . Vitamin D deficiency      Social History   Socioeconomic History  . Marital status: Married    Spouse name: Not on file  . Number of children: Not on file  . Years  of education: Not on file  . Highest education level: Not on file  Occupational History  . Not on file  Social Needs  . Financial resource strain: Not on file  . Food insecurity    Worry: Not on file    Inability: Not on file  . Transportation needs    Medical: Not on file    Non-medical: Not on file  Tobacco Use  . Smoking status: Never Smoker  . Smokeless tobacco: Never Used  Substance and Sexual Activity  . Alcohol use: No  . Drug use: Not on file  . Sexual activity: Yes    Birth control/protection: Post-menopausal  Lifestyle  . Physical activity    Days per week: 7 days    Minutes per session: Not on file  . Stress: Not on file  Relationships  . Social Herbalist on phone: Not on file    Gets together: Not on file    Attends religious service: Not on file    Active member of club or organization: Not on file    Attends meetings of clubs or organizations: Not on file    Relationship status: Not on file  . Intimate partner violence    Fear of current or ex partner: Not on file    Emotionally abused: Not on file    Physically abused: Not on  file    Forced sexual activity: Not on file  Other Topics Concern  . 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    Past Surgical History:  Procedure Laterality Date  . [place] STUDY N/A 11/29/2015   Procedure: [place] STUDY;  Surgeon: Manus Gunning, MD;  [facility];  Service: Gastroenterology;  Laterality: N/A;  . ABDOMINAL HYSTERECTOMY  12/04/2000   also bilateral salpingo-oophorectomy  . CARDIAC CATHETERIZATION  2006  . CARPAL TUNNEL RELEASE  11/20/2011   Procedure: CARPAL TUNNEL RELEASE;  Surgeon: Elaina Hoops, MD;  Location: [place] [facility];  Service: Neurosurgery;  Laterality: Left;  LEFT carpal tunnel release  . CERVICAL SPINE SURGERY  2013   [place]   . DILATION AND CURETTAGE OF UTERUS     11/15/2000  . ESOPHAGEAL MANOMETRY N/A 09/20/2015   Procedure: ESOPHAGEAL MANOMETRY (EM);  Surgeon: Manus Gunning, MD;  [facility];  Service: Gastroenterology;  Laterality: N/A;  . HYSTEROSCOPY  11/15/2000   D & C, resection of endometrial polyps  . OOPHORECTOMY  12/04/2000   bilateral salpingo-oophorectomy done with TAH  . TUBAL LIGATION      Family History  Problem Relation Age of Onset  . Cancer Father        prostate cancer  . Hypertension Father   . Hypertension Sister   . Diabetes Brother   . Heart disease Brother        Deceased   . Hypertension Brother   . Diabetes Brother   . Heart disease Brother        Deceased  . Breast cancer Neg Hx     No Known Allergies  Current Outpatient Medications on File Prior to Visit  Medication Sig Dispense Refill  . aspirin EC 81 MG tablet Take 81 mg by mouth daily.    Marland Kitchen atorvastatin (LIPITOR) 10 MG tablet Take 1 tablet by mouth once daily for cholesterol. 90 tablet 2  . Calcium Carbonate-Vit D-Min (CALTRATE 600+D PLUS PO) Take 1 tablet by mouth 2 (two) times daily.      . fluticasone (FLONASE) 50 MCG/ACT nasal spray Place 2 sprays into both nostrils daily as needed for allergies. 16 g 2  . gabapentin (NEURONTIN) 100 MG capsule Take 1-2 capsules (100-200 mg total) by mouth at bedtime. Sedation precautions 90 capsule 1  . levocetirizine (XYZAL) 5 MG tablet Take 1 tablet by mouth every evening for allergies. 90 tablet 0  . losartan (COZAAR) 100 MG tablet Take 1 tablet by mouth once daily for blood pressure. 90 tablet 2  . metoprolol succinate (TOPROL-XL) 50 MG 24 hr tablet TAKE 1 TABLET BY MOUTH ONCE DAILY (TAKE  WITH  OR  IMMEDIATELY  FOLLOWING  A  MEAL) 90 tablet 2  . nitroGLYCERIN (NITROGLYN) 2 % ointment Apply 1 inch topically 4 (four) times daily as needed for chest pain.    . pantoprazole (PROTONIX) 40 MG tablet Take 1 tablet (40 mg total) by mouth daily. 90 tablet 1  .  ranitidine (ZANTAC) 150 MG tablet Take 1 tablet by mouth once daily for heartburn. 90 tablet 2  . tiZANidine (ZANAFLEX) 4 MG tablet Take 1 tablet (4 mg total) by mouth every 8 (eight) hours as needed for muscle spasms. 30 tablet 0   No current facility-administered medications  on file prior to visit.     BP 120/78   Pulse 84   Temp 98.5 F (36.9 C) (Temporal)   Ht 5\' 5"  (1.651 m)   Wt 148 lb 8 oz (67.4 kg)   SpO2 98%   BMI 24.71 kg/m    Objective:   Physical Exam  Constitutional: She appears well-nourished.  Neck: Muscular tenderness present. No spinous process tenderness present.    Slight reduction in ROM to most fields of movement.   Respiratory: Effort normal.  Skin: Skin is warm and dry.           Assessment & Plan:

## 2018-12-10 NOTE — Assessment & Plan Note (Signed)
Chronic, evaluated by her surgeon and also with pain management. Discussed to call pain management for follow up injection. Exam today overall stable, does seem to have nerve involvement.   Rx for Flexeril provided today to use HS and for upcoming dental surgery.

## 2018-12-10 NOTE — Patient Instructions (Addendum)
You may take cyclobenzaprine tablets twice daily as needed for neck pain and muscle spasms. You can take 1-2 tablets, start at bedtime as this may cause drowsiness.  Call the pain management in regards to your chronic pain.  It was a pleasure to see you today!

## 2019-01-08 ENCOUNTER — Ambulatory Visit
Admission: RE | Admit: 2019-01-08 | Discharge: 2019-01-08 | Disposition: A | Discharge status: Home / Self Care | Payer: Medicare HMO | Source: Ambulatory Visit | Attending: Primary Care | Admitting: Primary Care

## 2019-01-08 DIAGNOSIS — Z1231 Encounter for screening mammogram for malignant neoplasm of breast: Secondary | ICD-10-CM | POA: Insufficient documentation

## 2019-01-13 ENCOUNTER — Other Ambulatory Visit: Payer: Self-pay | Admitting: Primary Care

## 2019-01-13 DIAGNOSIS — E559 Vitamin D deficiency, unspecified: Secondary | ICD-10-CM

## 2019-01-13 DIAGNOSIS — I10 Essential (primary) hypertension: Secondary | ICD-10-CM

## 2019-01-13 DIAGNOSIS — R7303 Prediabetes: Secondary | ICD-10-CM

## 2019-01-13 DIAGNOSIS — E785 Hyperlipidemia, unspecified: Secondary | ICD-10-CM

## 2019-02-05 ENCOUNTER — Other Ambulatory Visit: Payer: Self-pay | Admitting: Primary Care

## 2019-02-05 DIAGNOSIS — E785 Hyperlipidemia, unspecified: Secondary | ICD-10-CM

## 2019-02-05 DIAGNOSIS — I10 Essential (primary) hypertension: Secondary | ICD-10-CM

## 2019-02-06 ENCOUNTER — Telehealth: Payer: Self-pay

## 2019-02-06 NOTE — Telephone Encounter (Signed)
Left detailed VM w COVID screen and back door lab info and front door info   

## 2019-02-10 ENCOUNTER — Other Ambulatory Visit (INDEPENDENT_AMBULATORY_CARE_PROVIDER_SITE_OTHER): Payer: Medicare HMO

## 2019-02-10 DIAGNOSIS — E785 Hyperlipidemia, unspecified: Secondary | ICD-10-CM | POA: Diagnosis not present

## 2019-02-10 DIAGNOSIS — R7303 Prediabetes: Secondary | ICD-10-CM | POA: Diagnosis not present

## 2019-02-10 DIAGNOSIS — E559 Vitamin D deficiency, unspecified: Secondary | ICD-10-CM

## 2019-02-10 DIAGNOSIS — I10 Essential (primary) hypertension: Secondary | ICD-10-CM

## 2019-02-10 LAB — COMPREHENSIVE METABOLIC PANEL
ALT: 15 U/L (ref 0–35)
AST: 20 U/L (ref 0–37)
Albumin: 4 g/dL (ref 3.5–5.2)
Alkaline Phosphatase: 46 U/L (ref 39–117)
BUN: 15 mg/dL (ref 6–23)
CO2: 27 mEq/L (ref 19–32)
Calcium: 9.6 mg/dL (ref 8.4–10.5)
Chloride: 107 mEq/L (ref 96–112)
Creatinine, Ser: 0.91 mg/dL (ref 0.40–1.20)
GFR: 61.76 mL/min (ref >60.00)
Glucose, Bld: 99 mg/dL (ref 70–99)
Potassium: 4.1 mEq/L (ref 3.5–5.1)
Sodium: 141 mEq/L (ref 135–145)
Total Bilirubin: 0.7 mg/dL (ref 0.2–1.2)
Total Protein: 6.7 g/dL (ref 6.0–8.3)

## 2019-02-10 LAB — LIPID PANEL
Cholesterol: 158 mg/dL (ref 0–200)
HDL: 36.6 mg/dL — ABNORMAL LOW (ref >39.00)
LDL Cholesterol: 97 mg/dL (ref 0–99)
NonHDL: 121.41
Total CHOL/HDL Ratio: 4
Triglycerides: 121 mg/dL (ref 0.0–149.0)
VLDL: 24.2 mg/dL (ref 0.0–40.0)

## 2019-02-10 LAB — HEMOGLOBIN A1C: Hgb A1c MFr Bld: 5.4 % (ref 4.6–6.5)

## 2019-02-10 LAB — VITAMIN D 25 HYDROXY (VIT D DEFICIENCY, FRACTURES): VITD: 24.8 ng/mL — ABNORMAL LOW (ref 30.00–100.00)

## 2019-02-14 ENCOUNTER — Encounter: Payer: Self-pay | Admitting: *Deleted

## 2019-02-14 ENCOUNTER — Other Ambulatory Visit: Payer: Self-pay

## 2019-02-14 ENCOUNTER — Encounter: Payer: Self-pay | Admitting: Primary Care

## 2019-02-14 ENCOUNTER — Ambulatory Visit (INDEPENDENT_AMBULATORY_CARE_PROVIDER_SITE_OTHER): Payer: Medicare HMO | Admitting: Primary Care

## 2019-02-14 VITALS — BP 120/76 | HR 88 | Temp 97.8°F | Ht 65.0 in | Wt 134.5 lb

## 2019-02-14 DIAGNOSIS — Z23 Encounter for immunization: Secondary | ICD-10-CM | POA: Diagnosis not present

## 2019-02-14 DIAGNOSIS — Z0000 Encounter for general adult medical examination without abnormal findings: Secondary | ICD-10-CM | POA: Diagnosis not present

## 2019-02-14 DIAGNOSIS — E559 Vitamin D deficiency, unspecified: Secondary | ICD-10-CM

## 2019-02-14 DIAGNOSIS — M8580 Other specified disorders of bone density and structure, unspecified site: Secondary | ICD-10-CM

## 2019-02-14 DIAGNOSIS — K219 Gastro-esophageal reflux disease without esophagitis: Secondary | ICD-10-CM

## 2019-02-14 DIAGNOSIS — M542 Cervicalgia: Secondary | ICD-10-CM | POA: Diagnosis not present

## 2019-02-14 DIAGNOSIS — F411 Generalized anxiety disorder: Secondary | ICD-10-CM

## 2019-02-14 DIAGNOSIS — Z0001 Encounter for general adult medical examination with abnormal findings: Secondary | ICD-10-CM | POA: Insufficient documentation

## 2019-02-14 DIAGNOSIS — Z9889 Other specified postprocedural states: Secondary | ICD-10-CM

## 2019-02-14 DIAGNOSIS — R7303 Prediabetes: Secondary | ICD-10-CM

## 2019-02-14 DIAGNOSIS — J309 Allergic rhinitis, unspecified: Secondary | ICD-10-CM

## 2019-02-14 MED ORDER — OMEPRAZOLE 40 MG PO CPDR: 40.0000 mg | DELAYED_RELEASE_CAPSULE | Freq: Every day | ORAL | 3 refills | Status: DC

## 2019-02-14 MED ORDER — ZOSTER VAC RECOMB ADJUVANTED 50 MCG/0.5ML IM SUSR: 0.5000 mL | Freq: Once | INTRAMUSCULAR | 1 refills | Status: AC

## 2019-02-14 NOTE — Assessment & Plan Note (Signed)
Compliant to calcium and vitmain D, however, recent vitamin D level too low. Add on 1000 units daily.

## 2019-02-14 NOTE — Assessment & Plan Note (Signed)
Denies concerns for anxiety. Continue to monitor.  

## 2019-02-14 NOTE — Assessment & Plan Note (Addendum)
Following with orthopedics, recently received several injections with improvement. Doing well on gabapentin HS, continue same.

## 2019-02-14 NOTE — Assessment & Plan Note (Signed)
Resolved.  Commended her on dietary changes and activity level.

## 2019-02-14 NOTE — Assessment & Plan Note (Signed)
Compliant to calcium and vitamin D. Recent vitamin D level too low. Add on 1000 units once daily.  Encouraged weight bearing exercise.

## 2019-02-14 NOTE — Patient Instructions (Signed)
Please call me with the type and date of your last pneumonia vaccination.  Continue exercising. You should be getting 150 minutes of exercise weekly.  Continue to work on a healthy diet.  Take the Shingles vaccination to your pharmacy in one month.  It was a pleasure to see you today!   Preventive Care 66 Years and Older, Female Preventive care refers to lifestyle choices and visits with your health care provider that can promote health and wellness. This includes:  A yearly physical exam. This is also called an annual well check.  Regular dental and eye exams.  Immunizations.  Screening for certain conditions.  Healthy lifestyle choices, such as diet and exercise. What can I expect for my preventive care visit? Physical exam Your health care provider will check:  Height and weight. These may be used to calculate body mass index (BMI), which is a measurement that tells if you are at a healthy weight.  Heart rate and blood pressure.  Your skin for abnormal spots. Counseling Your health care provider may ask you questions about:  Alcohol, tobacco, and drug use.  Emotional well-being.  Home and relationship well-being.  Sexual activity.  Eating habits.  History of falls.  Memory and ability to understand (cognition).  Work and work Statistician.  Pregnancy and menstrual history. What immunizations do I need?  Influenza (flu) vaccine  This is recommended every year. Tetanus, diphtheria, and pertussis (Tdap) vaccine  You may need a Td booster every 10 years. Varicella (chickenpox) vaccine  You may need this vaccine if you have not already been vaccinated. Zoster (shingles) vaccine  You may need this after age 30. Pneumococcal conjugate (PCV13) vaccine  One dose is recommended after age 22. Pneumococcal polysaccharide (PPSV23) vaccine  One dose is recommended after age 75. Measles, mumps, and rubella (MMR) vaccine  You may need at least one dose of  MMR if you were born in 1957 or later. You may also need a second dose. Meningococcal conjugate (MenACWY) vaccine  You may need this if you have certain conditions. Hepatitis A vaccine  You may need this if you have certain conditions or if you travel or work in places where you may be exposed to hepatitis A. Hepatitis B vaccine  You may need this if you have certain conditions or if you travel or work in places where you may be exposed to hepatitis B. Haemophilus influenzae type b (Hib) vaccine  You may need this if you have certain conditions. You may receive vaccines as individual doses or as more than one vaccine together in one shot (combination vaccines). Talk with your health care provider about the risks and benefits of combination vaccines. What tests do I need? Blood tests  Lipid and cholesterol levels. These may be checked every 5 years, or more frequently depending on your overall health.  Hepatitis C test.  Hepatitis B test. Screening  Lung cancer screening. You may have this screening every year starting at age 28 if you have a 30-pack-year history of smoking and currently smoke or have quit within the past 15 years.  Colorectal cancer screening. All adults should have this screening starting at age 22 and continuing until age 98. Your health care provider may recommend screening at age 64 if you are at increased risk. You will have tests every 1-10 years, depending on your results and the type of screening test.  Diabetes screening. This is done by checking your blood sugar (glucose) after you have not eaten for  a while (fasting). You may have this done every 1-3 years.  Mammogram. This may be done every 1-2 years. Talk with your health care provider about how often you should have regular mammograms.  BRCA-related cancer screening. This may be done if you have a family history of breast, ovarian, tubal, or peritoneal cancers. Other tests  Sexually transmitted  disease (STD) testing.  Bone density scan. This is done to screen for osteoporosis. You may have this done starting at age 43. Follow these instructions at home: Eating and drinking  Eat a diet that includes fresh fruits and vegetables, whole grains, lean protein, and low-fat dairy products. Limit your intake of foods with high amounts of sugar, saturated fats, and salt.  Take vitamin and mineral supplements as recommended by your health care provider.  Do not drink alcohol if your health care provider tells you not to drink.  If you drink alcohol: ? Limit how much you have to 0-1 drink a day. ? Be aware of how much alcohol is in your drink. In the [place] one drink equals one 12 oz bottle of beer (355 mL), one 5 oz glass of wine (148 mL), or one 1 oz glass of hard liquor (44 mL). Lifestyle  Take daily care of your teeth and gums.  Stay active. Exercise for at least 30 minutes on 5 or more days each week.  Do not use any products that contain nicotine or tobacco, such as cigarettes, e-cigarettes, and chewing tobacco. If you need help quitting, ask your health care provider.  If you are sexually active, practice safe sex. Use a condom or other form of protection in order to prevent STIs (sexually transmitted infections).  Talk with your health care provider about taking a low-dose aspirin or statin. What's next?  Go to your health care provider once a year for a well check visit.  Ask your health care provider how often you should have your eyes and teeth checked.  Stay up to date on all vaccines. This information is not intended to replace advice given to you by your health care provider. Make sure you discuss any questions you have with your health care provider. Document Released: 06/11/2015 Document Revised: 05/09/2018 Document Reviewed: 05/09/2018 Elsevier Patient Education  2020 [employer].

## 2019-02-14 NOTE — Addendum Note (Signed)
Addended by: Jacqualin Combes on: 02/14/2019 10:56 AM   Modules accepted: Orders

## 2019-02-14 NOTE — Assessment & Plan Note (Signed)
Chronic, intermittent during seasonal changes. Overall stable.

## 2019-02-14 NOTE — Progress Notes (Signed)
Subjective:    Patient ID: Valerie [name], female    DOB: [birth year], 68 y.o.   MRN: [id number]  HPI  Valerie [name] is a 68 year old female who presents today for complete physical.  Immunizations: -Tetanus: Completed in 2017 -Influenza: Due today -Pneumonia: Completed several years ago while working. -Shingles: Completed in 2017  Diet: She endorses a healthy diet. She is eating vegetables, starch, some meat. Some soda, mostly water, unsweet tea. Desserts infrequently. Exercise: She is walking some, active at her home  Eye exam: Completed in 2020 Dental exam: Following semi-annually Colonoscopy: Completed in 2011 Mammogram: Completed in 2020 Dexa: Completed in 2019 Hep C Screen:  Negative  BP Readings from Last 3 Encounters:  02/14/19 120/76  12/10/18 120/78  08/13/18 118/78   Wt Readings from Last 3 Encounters:  02/14/19 134 lb 8 oz (61 kg)  12/10/18 148 lb 8 oz (67.4 kg)  08/13/18 145 lb 4 oz (65.9 kg)     Review of Systems  Constitutional: Negative for unexpected weight change.  HENT: Negative for rhinorrhea.   Respiratory: Negative for cough and shortness of breath.   Cardiovascular: Negative for chest pain.  Gastrointestinal: Negative for constipation and diarrhea.       Continued GERD despite pantoprazole, did better on omeprazole  Genitourinary: Negative for difficulty urinating and menstrual problem.  Musculoskeletal: Positive for arthralgias, myalgias and neck pain.       Chronic neck pain improving since injections  Skin: Negative for rash.  Allergic/Immunologic: Negative for environmental allergies.  Neurological: Negative for dizziness, numbness and headaches.  Psychiatric/Behavioral: The patient is not nervous/anxious.        Past Medical History:  Diagnosis Date  . Acid reflux   . Adenocarcinoma of the endometrium/uterus ([place]) 12/04/2000  . Anal fissure   . Atrophic vaginitis   . Esophagitis   . Hernia    Present to proximal to umbillical   .  Hypertension   . Internal hemorrhoids   . Menorrhagia   . Osteopenia   . PONV (postoperative nausea and vomiting)   . Vertigo   . Vitamin D deficiency      Social History   Socioeconomic History  . Marital status: Married    Spouse name: Not on file  . Number of children: Not on file  . Years of education: Not on file  . Highest education level: Not on file  Occupational History  . Not on file  Social Needs  . Financial resource strain: Not on file  . Food insecurity    Worry: Not on file    Inability: Not on file  . Transportation needs    Medical: Not on file    Non-medical: Not on file  Tobacco Use  . Smoking status: Never Smoker  . Smokeless tobacco: Never Used  Substance and Sexual Activity  . Alcohol use: No  . Drug use: Not on file  . Sexual activity: Yes    Birth control/protection: Post-menopausal  Lifestyle  . Physical activity    Days per week: 7 days    Minutes per session: Not on file  . Stress: Not on file  Relationships  . Social Herbalist on phone: Not on file    Gets together: Not on file    Attends religious service: Not on file    Active member of club or organization: Not on file    Attends meetings of clubs or organizations: Not on file    Relationship  status: Not on file  . Intimate partner violence    Fear of current or ex partner: Not on file    Emotionally abused: Not on file    Physically abused: Not on file    Forced sexual activity: Not on file  Other Topics Concern  . 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    Past Surgical History:  Procedure Laterality Date  . [place] STUDY N/A 11/29/2015   Procedure: [place] STUDY;  Surgeon: Manus Gunning, MD;  Location: [facility];  Service: Gastroenterology;  Laterality: N/A;  . ABDOMINAL HYSTERECTOMY  12/04/2000   also bilateral  salpingo-oophorectomy  . CARDIAC CATHETERIZATION  2006  . CARPAL TUNNEL RELEASE  11/20/2011   Procedure: CARPAL TUNNEL RELEASE;  Surgeon: Elaina Hoops, MD;  Location: [place] [facility];  Service: Neurosurgery;  Laterality: Left;  LEFT carpal tunnel release  . CERVICAL SPINE SURGERY  2013   [place]  . DILATION AND CURETTAGE OF UTERUS     11/15/2000  . ESOPHAGEAL MANOMETRY N/A 09/20/2015   Procedure: ESOPHAGEAL MANOMETRY (EM);  Surgeon: Manus Gunning, MD;  Location: [facility];  Service: Gastroenterology;  Laterality: N/A;  . HYSTEROSCOPY  11/15/2000   D & C, resection of endometrial polyps  . OOPHORECTOMY  12/04/2000   bilateral salpingo-oophorectomy done with TAH  . TUBAL LIGATION      Family History  Problem Relation Age of Onset  . Cancer Father        prostate cancer  . Hypertension Father   . Hypertension Sister   . Diabetes Brother   . Heart disease Brother        Deceased   . Hypertension Brother   . Diabetes Brother   . Heart disease Brother        Deceased  . Breast cancer Neg Hx     No Known Allergies  Current Outpatient Medications on File Prior to Visit  Medication Sig Dispense Refill  . aspirin EC 81 MG tablet Take 81 mg by mouth daily.    Marland Kitchen atorvastatin (LIPITOR) 10 MG tablet TAKE 1 TABLET ONCE DAILY FOR CHOLESTEROL. 90 tablet 0  . Calcium Carbonate-Vit D-Min (CALTRATE 600+D PLUS PO) Take 1 tablet by mouth 2 (two) times daily.      . fluticasone (FLONASE) 50 MCG/ACT nasal spray Place 2 sprays into both nostrils daily as needed for allergies. 16 g 2  . gabapentin (NEURONTIN) 100 MG capsule Take 1-2 capsules (100-200 mg total) by mouth at bedtime. Sedation precautions 90 capsule 1  . levocetirizine (XYZAL) 5 MG tablet Take 1 tablet by mouth every evening for allergies. 90 tablet 0  . losartan (COZAAR) 100 MG tablet TAKE 1 TABLET ONCE DAILY FOR BLOOD PRESSURE. 90 tablet 0  . metoprolol succinate (TOPROL-XL) 50 MG 24 hr tablet TAKE 1  TABLET ONCE DAILY (TAKE  WITH  OR  IMMEDIATELY  FOLLOWING  A  MEAL) 90 tablet 0  . nitroGLYCERIN (NITROGLYN) 2 % ointment Apply 1 inch topically 4 (four) times daily as needed for chest pain.     No current facility-administered medications on file prior to visit.     BP 120/76   Pulse 88   Temp 97.8 F (36.6 C) (Temporal)   Ht 5\' 5"  (1.651 m)   Wt 134 lb  8 oz (61 kg)   SpO2 98%   BMI 22.38 kg/m    Objective:   Physical Exam  Constitutional: She is oriented to person, place, and time. She appears well-nourished.  HENT:  Right Ear: Tympanic membrane and ear canal normal.  Left Ear: Tympanic membrane and ear canal normal.  Mouth/Throat: Oropharynx is clear and moist.  Eyes: Pupils are equal, round, and reactive to light. EOM are normal.  Neck: Neck supple.  Cardiovascular: Normal rate and regular rhythm.  Respiratory: Effort normal and breath sounds normal.  GI: Soft. Bowel sounds are normal. There is no abdominal tenderness.  Musculoskeletal: Normal range of motion.  Neurological: She is alert and oriented to person, place, and time.  Skin: Skin is warm and dry.  Psychiatric: She has a normal mood and affect.           Assessment & Plan:

## 2019-02-14 NOTE — Assessment & Plan Note (Signed)
Chronic. Continued symptoms on pantoprazole, did better on omeprazole. Rx for omeprazole sent to pharmacy. She will update.

## 2019-04-03 ENCOUNTER — Ambulatory Visit (INDEPENDENT_AMBULATORY_CARE_PROVIDER_SITE_OTHER)
Admission: RE | Admit: 2019-04-03 | Discharge: 2019-04-03 | Disposition: A | Discharge status: Home / Self Care | Payer: Medicare HMO | Source: Ambulatory Visit | Attending: Primary Care | Admitting: Primary Care

## 2019-04-03 ENCOUNTER — Ambulatory Visit (INDEPENDENT_AMBULATORY_CARE_PROVIDER_SITE_OTHER): Payer: Medicare HMO | Admitting: Primary Care

## 2019-04-03 ENCOUNTER — Other Ambulatory Visit: Payer: Self-pay

## 2019-04-03 ENCOUNTER — Encounter: Payer: Self-pay | Admitting: Primary Care

## 2019-04-03 VITALS — BP 122/76 | HR 79 | Temp 97.7°F | Ht 65.0 in | Wt 142.5 lb

## 2019-04-03 DIAGNOSIS — M545 Low back pain: Secondary | ICD-10-CM | POA: Diagnosis not present

## 2019-04-03 DIAGNOSIS — R29898 Other symptoms and signs involving the musculoskeletal system: Secondary | ICD-10-CM

## 2019-04-03 DIAGNOSIS — G8929 Other chronic pain: Secondary | ICD-10-CM | POA: Diagnosis not present

## 2019-04-03 LAB — CBC
HCT: 38.4 % (ref 36.0–46.0)
Hemoglobin: 13 g/dL (ref 12.0–15.0)
MCHC: 33.9 g/dL (ref 30.0–36.0)
MCV: 96.1 fl (ref 78.0–100.0)
Platelets: 167 10*3/uL (ref 150.0–400.0)
RBC: 4 Mil/uL (ref 3.87–5.11)
RDW: 14.3 % (ref 11.5–15.5)
WBC: 4.6 10*3/uL (ref 4.0–10.5)

## 2019-04-03 LAB — VITAMIN B12: Vitamin B-12: 195 pg/mL — ABNORMAL LOW (ref 211–911)

## 2019-04-03 LAB — TSH: TSH: 2.1 u[IU]/mL (ref 0.35–4.50)

## 2019-04-03 NOTE — Progress Notes (Signed)
Subjective:    Patient ID: Valerie [name], female    DOB: 04/15/1951, 68 y.o.   MRN: [id number]  HPI  Valerie [name] is a 68 year old female with a history of hypertension, BPPV, osteopenia, adenocarcinoma of the uterus, anxiety disorder, vitamin D deficiency, prediabetes, hyperlipidemia who presents today with a chief complaint of lower extremity weakness.  She was evaluated at [facility] 2 weeks ago for right otalgia. Treated with antibiotics and prednisone, for "ear infections" bilaterally. She continues to experience discomfort to the ears.   About one week ago she started noticing bilateral lower extremity weakness to the entire legs. She completed the prednisone and antibiotics at this time. She was ironing yesterday and felt as though both of her legs were going to "give out from underneath me". Weakness is intermittent, worse with standing and walking. She does have discomfort to her bilateral knees that she notices when resting, numbness to her legs, fatigue, and chronic lower back pain.  She denies swelling, injury/trauma, color change. She underwent arthroscopic procedure to left knee and has continued to be "bothered". She's not drinking much water. She walks often and went to [facility] two days ago, did feel "weak" to her legs afterwards.   Review of Systems  Respiratory: Negative for shortness of breath.   Cardiovascular: Negative for chest pain.  Musculoskeletal: Positive for arthralgias.  Skin: Negative for color change.  Neurological: Negative for dizziness.       Past Medical History:  Diagnosis Date  . Acid reflux   . Adenocarcinoma of the endometrium/uterus ([place]) 12/04/2000  . Anal fissure   . Atrophic vaginitis   . Esophagitis   . Hernia    Present to proximal to umbillical   . Hypertension   . Internal hemorrhoids   . Menorrhagia   . Osteopenia   . PONV (postoperative nausea and vomiting)   . Vertigo   . Vitamin D deficiency      Social History    Socioeconomic History  . Marital status: Married    Spouse name: Not on file  . Number of children: Not on file  . Years of education: Not on file  . Highest education level: Not on file  Occupational History  . Not on file  Social Needs  . Financial resource strain: Not on file  . Food insecurity    Worry: Not on file    Inability: Not on file  . Transportation needs    Medical: Not on file    Non-medical: Not on file  Tobacco Use  . Smoking status: Never Smoker  . Smokeless tobacco: Never Used  Substance and Sexual Activity  . Alcohol use: No  . Drug use: Not on file  . Sexual activity: Yes    Birth control/protection: Post-menopausal  Lifestyle  . Physical activity    Days per week: 7 days    Minutes per session: Not on file  . Stress: Not on file  Relationships  . Social Herbalist on phone: Not on file    Gets together: Not on file    Attends religious service: Not on file    Active member of club or organization: Not on file    Attends meetings of clubs or organizations: Not on file    Relationship status: Not on file  . Intimate partner violence    Fear of current or ex partner: Not on file    Emotionally abused: Not on file  Physically abused: Not on file    Forced sexual activity: Not on file  Other Topics Concern  . 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    Past Surgical History:  Procedure Laterality Date  . [place] STUDY N/A 11/29/2015   Procedure: [place] STUDY;  Surgeon: Manus Gunning, MD;  Location[facility];  Service: Gastroenterology;  Laterality: N/A;  . ABDOMINAL HYSTERECTOMY  12/04/2000   also bilateral salpingo-oophorectomy  . CARDIAC CATHETERIZATION  2006  . CARPAL TUNNEL RELEASE  11/20/2011   Procedure: CARPAL TUNNEL RELEASE;  Surgeon: Elaina Hoops, MD;  Location: [place] [facility];  Service:  Neurosurgery;  Laterality: Left;  LEFT carpal tunnel release  . CERVICAL SPINE SURGERY  2013   [place]  . DILATION AND CURETTAGE OF UTERUS     11/15/2000  . ESOPHAGEAL MANOMETRY N/A 09/20/2015   Procedure: ESOPHAGEAL MANOMETRY (EM);  Surgeon: Manus Gunning, MD;  [facility];  Service: Gastroenterology;  Laterality: N/A;  . HYSTEROSCOPY  11/15/2000   D & C, resection of endometrial polyps  . OOPHORECTOMY  12/04/2000   bilateral salpingo-oophorectomy done with TAH  . TUBAL LIGATION      Family History  Problem Relation Age of Onset  . Cancer Father        prostate cancer  . Hypertension Father   . Hypertension Sister   . Diabetes Brother   . Heart disease Brother        Deceased   . Hypertension Brother   . Diabetes Brother   . Heart disease Brother        Deceased  . Breast cancer Neg Hx     No Known Allergies  Current Outpatient Medications on File Prior to Visit  Medication Sig Dispense Refill  . aspirin EC 81 MG tablet Take 81 mg by mouth daily.    Marland Kitchen atorvastatin (LIPITOR) 10 MG tablet TAKE 1 TABLET ONCE DAILY FOR CHOLESTEROL. 90 tablet 0  . Calcium Carbonate-Vit D-Min (CALTRATE 600+D PLUS PO) Take 1 tablet by mouth 2 (two) times daily.      . fluticasone (FLONASE) 50 MCG/ACT nasal spray Place 2 sprays into both nostrils daily as needed for allergies. 16 g 2  . gabapentin (NEURONTIN) 100 MG capsule Take 1-2 capsules (100-200 mg total) by mouth at bedtime. Sedation precautions 90 capsule 1  . levocetirizine (XYZAL) 5 MG tablet Take 1 tablet by mouth every evening for allergies. 90 tablet 0  . losartan (COZAAR) 100 MG tablet TAKE 1 TABLET ONCE DAILY FOR BLOOD PRESSURE. 90 tablet 0  . metoprolol succinate (TOPROL-XL) 50 MG 24 hr tablet TAKE 1 TABLET ONCE DAILY (TAKE  WITH  OR  IMMEDIATELY  FOLLOWING  A  MEAL) 90 tablet 0  . nitroGLYCERIN (NITROGLYN) 2 % ointment Apply 1 inch topically 4 (four) times daily as needed for chest pain.     Marland Kitchen omeprazole (PRILOSEC) 40 MG capsule Take 1 capsule (40 mg total) by mouth daily. For heartburn. 90 capsule 3   No current facility-administered medications on file prior to visit.     BP 122/76   Pulse 79   Temp 97.7 F (36.5 C) (Temporal)   Ht 5\' 5"  (1.651 m)   Wt 142 lb 8 oz (64.6 kg)   SpO2 97%   BMI  23.71 kg/m    Objective:   Physical Exam  Constitutional: She appears well-nourished.  Neck: Neck supple.  Cardiovascular: Normal rate and regular rhythm.  Pulses:      Dorsalis pedis pulses are 2+ on the right side and 2+ on the left side.       Posterior tibial pulses are 2+ on the right side and 2+ on the left side.  Respiratory: Effort normal.  Musculoskeletal:     Comments: 5/5 strength to bilateral lower extremities, ambulates well in clinic.   Skin: Skin is warm and dry.           Assessment & Plan:

## 2019-04-03 NOTE — Assessment & Plan Note (Signed)
Acute for the last week, some numbness and tingling, fatigue. Exam today unremarkable, ambulates very well in clinic. No alarm signs, flu shot was in mid September.   Suspect symptoms are secondary to degenerative changes to her lumbar spine, will confirm with xray. Check labs. Consider Physical therapy.

## 2019-04-03 NOTE — Patient Instructions (Signed)
Stop by the lab and xray prior to leaving today. I will notify you of your results once received.   Continue exercising. You should be getting 150 minutes of moderate intensity exercise weekly.  It was a pleasure to see you today!

## 2019-04-03 NOTE — Assessment & Plan Note (Signed)
Chronic for years, worse with consistent activity.  Check lumbar plain films. Consider physical therapy.

## 2019-04-07 ENCOUNTER — Other Ambulatory Visit: Payer: Self-pay | Admitting: Primary Care

## 2019-04-07 DIAGNOSIS — I10 Essential (primary) hypertension: Secondary | ICD-10-CM

## 2019-04-07 DIAGNOSIS — E785 Hyperlipidemia, unspecified: Secondary | ICD-10-CM

## 2019-04-29 ENCOUNTER — Ambulatory Visit (INDEPENDENT_AMBULATORY_CARE_PROVIDER_SITE_OTHER): Payer: Medicare HMO | Admitting: Family Medicine

## 2019-04-29 ENCOUNTER — Encounter: Payer: Self-pay | Admitting: Family Medicine

## 2019-04-29 ENCOUNTER — Other Ambulatory Visit: Payer: Self-pay

## 2019-04-29 VITALS — BP 130/80 | HR 71 | Temp 97.8°F | Ht 65.0 in | Wt 141.5 lb

## 2019-04-29 DIAGNOSIS — H6981 Other specified disorders of Eustachian tube, right ear: Secondary | ICD-10-CM | POA: Diagnosis not present

## 2019-04-29 MED ORDER — PREDNISONE 20 MG PO TABS: ORAL_TABLET | ORAL | 0 refills | Status: DC

## 2019-04-29 NOTE — Progress Notes (Signed)
Chief Complaint  Patient presents with  . Ear Pain    Right x 3 weeks    History of Present Illness: Otalgia  There is pain in the right ear. This is a new problem. The current episode started 1 to 4 weeks ago (3 weeks). The problem occurs constantly. The problem has been unchanged. There has been no fever. Associated symptoms include rhinorrhea. Pertinent negatives include no coughing, headaches, rash or sore throat. Associated symptoms comments: Itchy eyes. Treatments tried: Coricidin. The treatment provided no relief. allergy meds.. coricidin.. does not help, no allergy med now      This visit occurred during the SARS-CoV-2 public health emergency.  Safety protocols were in place, including screening questions prior to the visit, additional usage of staff PPE, and extensive cleaning of exam room while observing appropriate contact time as indicated for disinfecting solutions.   COVID 19 screen:  No recent travel or known exposure to COVID19 The patient denies respiratory symptoms of COVID 19 at this time. The importance of social distancing was discussed today.     Review of Systems  HENT: Positive for ear pain and rhinorrhea. Negative for sore throat.   Respiratory: Negative for cough.   Skin: Negative for rash.  Neurological: Negative for headaches.      Past Medical History:  Diagnosis Date  . Acid reflux   . Adenocarcinoma of the endometrium/uterus ([place]) 12/04/2000  . Anal fissure   . Atrophic vaginitis   . Esophagitis   . Hernia    Present to proximal to umbillical   . Hypertension   . Internal hemorrhoids   . Menorrhagia   . Osteopenia   . PONV (postoperative nausea and vomiting)   . Vertigo   . Vitamin D deficiency     reports that she has never smoked. She has never used smokeless tobacco. She reports that she does not drink alcohol.   Current Outpatient Medications:  .  aspirin EC 81 MG tablet, Take 81 mg by mouth daily., Disp: , Rfl:  .  atorvastatin  (LIPITOR) 10 MG tablet, TAKE 1 TABLET ONCE DAILY FOR CHOLESTEROL., Disp: 90 tablet, Rfl: 2 .  Calcium Carbonate-Vit D-Min (CALTRATE 600+D PLUS PO), Take 1 tablet by mouth 2 (two) times daily.  , Disp: , Rfl:  .  fluticasone (FLONASE) 50 MCG/ACT nasal spray, Place 2 sprays into both nostrils daily as needed for allergies., Disp: 16 g, Rfl: 2 .  gabapentin (NEURONTIN) 100 MG capsule, Take 1-2 capsules (100-200 mg total) by mouth at bedtime. Sedation precautions, Disp: 90 capsule, Rfl: 1 .  levocetirizine (XYZAL) 5 MG tablet, Take 1 tablet by mouth every evening for allergies., Disp: 90 tablet, Rfl: 0 .  losartan (COZAAR) 100 MG tablet, TAKE 1 TABLET ONCE DAILY FOR BLOOD PRESSURE., Disp: 90 tablet, Rfl: 2 .  metoprolol succinate (TOPROL-XL) 50 MG 24 hr tablet, TAKE 1 TABLET ONCE DAILY (TAKE  WITH  OR  IMMEDIATELY  FOLLOWING  A  MEAL), Disp: 90 tablet, Rfl: 2 .  nitroGLYCERIN (NITROGLYN) 2 % ointment, Apply 1 inch topically 4 (four) times daily as needed for chest pain., Disp: , Rfl:  .  omeprazole (PRILOSEC) 40 MG capsule, Take 1 capsule (40 mg total) by mouth daily. For heartburn., Disp: 90 capsule, Rfl: 3   Observations/Objective: Blood pressure 130/80, pulse 71, temperature 97.8 F (36.6 C), temperature source Temporal, height 5\' 5"  (1.651 m), weight 141 lb 8 oz (64.2 kg), SpO2 99 %.  Physical Exam Constitutional:  General: She is not in acute distress.    Appearance: Normal appearance. She is well-developed. She is not ill-appearing or toxic-appearing.  HENT:     Head: Normocephalic.     Right Ear: Hearing, ear canal and external ear normal. Tenderness present. No drainage. A middle ear effusion is present. There is no impacted cerumen. No foreign body. Tympanic membrane is bulging. Tympanic membrane is not injected, scarred, perforated, erythematous or retracted.     Left Ear: Hearing, tympanic membrane, ear canal and external ear normal. No drainage or swelling.  No middle ear effusion.  Tympanic membrane is not erythematous, retracted or bulging.     Nose: No mucosal edema or rhinorrhea.     Right Sinus: No maxillary sinus tenderness or frontal sinus tenderness.     Left Sinus: No maxillary sinus tenderness or frontal sinus tenderness.     Mouth/Throat:     Pharynx: Uvula midline.  Eyes:     General: Lids are normal. Lids are everted, no foreign bodies appreciated.     Conjunctiva/sclera: Conjunctivae normal.     Pupils: Pupils are equal, round, and reactive to light.  Neck:     Musculoskeletal: Normal range of motion and neck supple.     Thyroid: No thyroid mass or thyromegaly.     Vascular: No carotid bruit.     Trachea: Trachea normal.  Cardiovascular:     Rate and Rhythm: Normal rate and regular rhythm.     Pulses: Normal pulses.     Heart sounds: Normal heart sounds, S1 normal and S2 normal. No murmur. No friction rub. No gallop.   Pulmonary:     Effort: Pulmonary effort is normal. No tachypnea or respiratory distress.     Breath sounds: Normal breath sounds. No decreased breath sounds, wheezing, rhonchi or rales.  Abdominal:     General: Bowel sounds are normal.     Palpations: Abdomen is soft.     Tenderness: There is no abdominal tenderness.  Skin:    General: Skin is warm and dry.     Findings: No rash.  Neurological:     Mental Status: She is alert.  Psychiatric:        Mood and Affect: Mood is not anxious or depressed.        Speech: Speech normal.        Behavior: Behavior normal. Behavior is cooperative.        Thought Content: Thought content normal.        Judgment: Judgment normal.      Assessment and Plan      Eliezer Lofts, MD

## 2019-04-29 NOTE — Assessment & Plan Note (Signed)
Treat with antihistamine and prednisone taper. Can use tylenol for pain.

## 2019-04-29 NOTE — Patient Instructions (Addendum)
Start Xyzal at bedtime.  Complete prednisone taper.  Call if pain is worsening or fever or shortness of breath.   Eustachian Tube Dysfunction  Eustachian tube dysfunction refers to a condition in which a blockage develops in the narrow passage that connects the middle ear to the back of the nose (eustachian tube). The eustachian tube regulates air pressure in the middle ear by letting air move between the ear and nose. It also helps to drain fluid from the middle ear space. Eustachian tube dysfunction can affect one or both ears. When the eustachian tube does not function properly, air pressure, fluid, or both can build up in the middle ear. What are the causes? This condition occurs when the eustachian tube becomes blocked or cannot open normally. Common causes of this condition include:  Ear infections.  Colds and other infections that affect the nose, mouth, and throat (upper respiratory tract).  Allergies.  Irritation from cigarette smoke.  Irritation from stomach acid coming up into the esophagus (gastroesophageal reflux). The esophagus is the tube that carries food from the mouth to the stomach.  Sudden changes in air pressure, such as from descending in an airplane or scuba diving.  Abnormal growths in the nose or throat, such as: ? Growths that line the nose (nasal polyps). ? Abnormal growth of cells (tumors). ? Enlarged tissue at the back of the throat (adenoids). What increases the risk? You are more likely to develop this condition if:  You smoke.  You are overweight.  You are a child who has: ? Certain birth defects of the mouth, such as cleft palate. ? Large tonsils or adenoids. What are the signs or symptoms? Common symptoms of this condition include:  A feeling of fullness in the ear.  Ear pain.  Clicking or popping noises in the ear.  Ringing in the ear.  Hearing loss.  Loss of balance.  Dizziness. Symptoms may get worse when the air pressure  around you changes, such as when you travel to an area of high elevation, fly on an airplane, or go scuba diving. How is this diagnosed? This condition may be diagnosed based on:  Your symptoms.  A physical exam of your ears, nose, and throat.  Tests, such as those that measure: ? The movement of your eardrum (tympanogram). ? Your hearing (audiometry). How is this treated? Treatment depends on the cause and severity of your condition.  In mild cases, you may relieve your symptoms by moving air into your ears. This is called "popping the ears."  In more severe cases, or if you have symptoms of fluid in your ears, treatment may include: ? Medicines to relieve congestion (decongestants). ? Medicines that treat allergies (antihistamines). ? Nasal sprays or ear drops that contain medicines that reduce swelling (steroids). ? A procedure to drain the fluid in your eardrum (myringotomy). In this procedure, a small tube is placed in the eardrum to:  Drain the fluid.  Restore the air in the middle ear space. ? A procedure to insert a balloon device through the nose to inflate the opening of the eustachian tube (balloon dilation). Follow these instructions at home: Lifestyle  Do not do any of the following until your health care provider approves: ? Travel to high altitudes. ? Fly in airplanes. ? Work in a Pension scheme manager or room. ? Scuba dive.  Do not use any products that contain nicotine or tobacco, such as cigarettes and e-cigarettes. If you need help quitting, ask your health care provider.  Keep your ears dry. Wear fitted earplugs during showering and bathing. Dry your ears completely after. General instructions  Take over-the-counter and prescription medicines only as told by your health care provider.  Use techniques to help pop your ears as recommended by your health care provider. These may include: ? Chewing gum. ? Yawning. ? Frequent, forceful swallowing. ? Closing  your mouth, holding your nose closed, and gently blowing as if you are trying to blow air out of your nose.  Keep all follow-up visits as told by your health care provider. This is important. Contact a health care provider if:  Your symptoms do not go away after treatment.  Your symptoms come back after treatment.  You are unable to pop your ears.  You have: ? A fever. ? Pain in your ear. ? Pain in your head or neck. ? Fluid draining from your ear.  Your hearing suddenly changes.  You become very dizzy.  You lose your balance. Summary  Eustachian tube dysfunction refers to a condition in which a blockage develops in the eustachian tube.  It can be caused by ear infections, allergies, inhaled irritants, or abnormal growths in the nose or throat.  Symptoms include ear pain, hearing loss, or ringing in the ears.  Mild cases are treated with maneuvers to unblock the ears, such as yawning or ear popping.  Severe cases are treated with medicines. Surgery may also be done (rare). This information is not intended to replace advice given to you by your health care provider. Make sure you discuss any questions you have with your health care provider. Document Released: 06/11/2015 Document Revised: 09/04/2017 Document Reviewed: 09/04/2017 Elsevier Patient Education  2020 [employer].

## 2019-05-15 ENCOUNTER — Encounter: Payer: Self-pay | Admitting: Family Medicine

## 2019-05-15 ENCOUNTER — Ambulatory Visit (INDEPENDENT_AMBULATORY_CARE_PROVIDER_SITE_OTHER): Payer: Medicare HMO | Admitting: Family Medicine

## 2019-05-15 VITALS — BP 110/72 | HR 70 | Temp 97.4°F | Ht 65.0 in | Wt 141.5 lb

## 2019-05-15 DIAGNOSIS — J0100 Acute maxillary sinusitis, unspecified: Secondary | ICD-10-CM | POA: Diagnosis not present

## 2019-05-15 MED ORDER — AMOXICILLIN-POT CLAVULANATE 875-125 MG PO TABS: 1.0000 | ORAL_TABLET | Freq: Two times a day (BID) | ORAL | 0 refills | Status: AC

## 2019-05-15 NOTE — Progress Notes (Signed)
Valerie Reasons T. Cherron Blitzer, MD Primary Care and Sports Medicine [facility] at [facility] [place], [address] Phone: [phone]  FAX: [place] - 68 y.o. female  MRN [id number]  Date of Birth: 1951-04-11  Visit Date: 05/15/2019  PCP: Pleas Koch, NP  Referred by: Pleas Koch, NP  Chief Complaint  Patient presents with  . Ear Pain    Bilateral    This visit occurred during the SARS-CoV-2 public health emergency.  Safety protocols were in place, including screening questions prior to the visit, additional usage of staff PPE, and extensive cleaning of exam room while observing appropriate contact time as indicated for disinfecting solutions.   Subjective:   Valerie [name] is a 68 y.o. very pleasant female patient who presents with the following:  B ear pain:  Has had some problems with her - has been going on for a while now.  3-4 months.   She really has been having some ear pain bilaterally for quite some time.  Dr. Diona Browner gave her a prednisone taper for 7 days and also recommended some Xyzal.  She is also been taking some Flonase, none of this is really helped.  She also has some pain in her sinus region both frontal as well as maxillary  Past Medical History, Surgical History, Social History, Family History, Problem List, Medications, and Allergies have been reviewed and updated if relevant.  Patient Active Problem List   Diagnosis Date Noted  . ETD (Eustachian tube dysfunction), right 04/29/2019  . Weakness of both lower extremities 04/03/2019  . Chronic back pain 04/03/2019  . Preventative health care 02/14/2019  . Vulvar dermatitis 01/01/2018  . Allergic rhinitis 08/22/2017  . Neck pain with history of cervical spinal surgery 01/22/2017  . Prediabetes 11/16/2016  . Hyperlipidemia 11/16/2015  . Esophageal reflux   . Hemorrhoid 06/30/2015  . Atypical chest pain 01/04/2015  . Palpitations 01/04/2015  .  Welcome to Medicare preventive visit 10/09/2014  . Benign paroxysmal positional vertigo 08/31/2014  . Hernia   . Menorrhagia   . Osteopenia   . Vitamin D deficiency   . Adenocarcinoma of the endometrium/uterus ([place])   . Generalized anxiety disorder 01/23/2007  . Essential hypertension 01/23/2007    Past Medical History:  Diagnosis Date  . Acid reflux   . Adenocarcinoma of the endometrium/uterus ([place]) 12/04/2000  . Anal fissure   . Atrophic vaginitis   . Esophagitis   . Hernia    Present to proximal to umbillical   . Hypertension   . Internal hemorrhoids   . Menorrhagia   . Osteopenia   . PONV (postoperative nausea and vomiting)   . Vertigo   . Vitamin D deficiency     Past Surgical History:  Procedure Laterality Date  . [place] STUDY N/A 11/29/2015   Procedure: [place] STUDY;  Surgeon: Manus Gunning, MD;  Location: [facility];  Service: Gastroenterology;  Laterality: N/A;  . ABDOMINAL HYSTERECTOMY  12/04/2000   also bilateral salpingo-oophorectomy  . CARDIAC CATHETERIZATION  2006  . CARPAL TUNNEL RELEASE  11/20/2011   Procedure: CARPAL TUNNEL RELEASE;  Surgeon: Elaina Hoops, MD;  Location: [facility];  Service: Neurosurgery;  Laterality: Left;  LEFT carpal tunnel release  . CERVICAL SPINE SURGERY  2013   [facility]  . DILATION AND CURETTAGE OF UTERUS     11/15/2000  . ESOPHAGEAL MANOMETRY N/A 09/20/2015  Procedure: ESOPHAGEAL MANOMETRY (EM);  Surgeon: Manus Gunning, MD;  Location: [facility];  Service: Gastroenterology;  Laterality: N/A;  . HYSTEROSCOPY  11/15/2000   D & C, resection of endometrial polyps  . OOPHORECTOMY  12/04/2000   bilateral salpingo-oophorectomy done with TAH  . TUBAL LIGATION      Social History   Socioeconomic History  . Marital status: Married    Spouse name: Not on file  . Number of children: Not on file  . Years of education: Not on file  . Highest education level: Not on file    Occupational History  . Not on file  Tobacco Use  . Smoking status: Never Smoker  . Smokeless tobacco: Never Used  Substance and Sexual Activity  . Alcohol use: No  . Drug use: Not on file  . Sexual activity: Yes    Birth control/protection: Post-menopausal  Other Topics Concern  . 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   Social Determinants of Health   Financial Resource Strain:   . Difficulty of Paying Living Expenses: Not on file  Food Insecurity:   . Worried About Charity fundraiser in the Last Year: Not on file  . Ran Out of Food in the Last Year: Not on file  Transportation Needs:   . Lack of Transportation (Medical): Not on file  . Lack of Transportation (Non-Medical): Not on file  Physical Activity:   . Days of Exercise per Week: Not on file  . Minutes of Exercise per Session: Not on file  Stress:   . Feeling of Stress : Not on file  Social Connections:   . Frequency of Communication with Friends and Family: Not on file  . Frequency of Social Gatherings with Friends and Family: Not on file  . Attends Religious Services: Not on file  . Active Member of Clubs or Organizations: Not on file  . Attends Archivist Meetings: Not on file  . Marital Status: Not on file  Intimate Partner Violence:   . Fear of Current or Ex-Partner: Not on file  . Emotionally Abused: Not on file  . Physically Abused: Not on file  . Sexually Abused: Not on file    Family History  Problem Relation Age of Onset  . Cancer Father        prostate cancer  . Hypertension Father   . Hypertension Sister   . Diabetes Brother   . Heart disease Brother        Deceased   . Hypertension Brother   . Diabetes Brother   . Heart disease Brother        Deceased  . Breast cancer Neg Hx     No Known Allergies  Medication list reviewed and updated in full in [facility].  ROS: GEN: Acute illness details above GI: Tolerating PO intake GU: maintaining adequate hydration and urination Pulm: No SOB Interactive and getting along well at home.  Otherwise, ROS is as per the HPI.  Objective:   BP 110/72   Pulse 70   Temp (!) 97.4 F (36.3 C) (Temporal)   Ht 5\' 5"  (1.651 m)   Wt 141 lb 8 oz (64.2 kg)   SpO2 98%   BMI 23.55 kg/m    Gen: WDWN, NAD; alert,appropriate and cooperative throughout exam    HEENT:  Normocephalic and atraumatic. Throat clear, w/o exudate, no LAD, R TM clear, L TM - good landmarks, No fluid present. rhinnorhea.  Left frontal and maxillary sinuses: Tender Right frontal and maxillary sinuses: Tender    Neck: No ant or post LAD  CV: RRR, No M/G/R  Pulm: Breathing comfortably in no resp distress. no w/c/r  Abd: S,NT,ND,+BS Extr: no c/c/e Psych: full affect, pleasant    Laboratory and Imaging Data:  Assessment and Plan:     ICD-10-CM   1. Acute non-recurrent maxillary sinusitis  J01.00    Her eardrums are normal and clear.  Sinuses are tender, and I think that this is referred ear pain from her sinuses and we will treat as such.  Follow-up: No follow-ups on file.  Meds ordered this encounter  Medications  . amoxicillin-clavulanate (AUGMENTIN) 875-125 MG tablet    Sig: Take 1 tablet by mouth 2 (two) times daily for 10 days.    Dispense:  20 tablet    Refill:  0   No orders of the defined types were placed in this encounter.   Signed,  Maud Deed. Hanish Laraia, MD   Outpatient Encounter Medications as of 05/15/2019  Medication Sig  . aspirin EC 81 MG tablet Take 81 mg by mouth daily.  Marland Kitchen atorvastatin (LIPITOR) 10 MG tablet TAKE 1 TABLET ONCE DAILY FOR CHOLESTEROL.  . Calcium Carbonate-Vit D-Min (CALTRATE 600+D PLUS PO) Take 1 tablet by mouth 2 (two) times daily.    . fluticasone (FLONASE) 50 MCG/ACT nasal spray Place 2 sprays into both nostrils daily as needed for allergies.  Marland Kitchen gabapentin (NEURONTIN)  100 MG capsule Take 1-2 capsules (100-200 mg total) by mouth at bedtime. Sedation precautions  . levocetirizine (XYZAL) 5 MG tablet Take 1 tablet by mouth every evening for allergies.  Marland Kitchen losartan (COZAAR) 100 MG tablet TAKE 1 TABLET ONCE DAILY FOR BLOOD PRESSURE.  . metoprolol succinate (TOPROL-XL) 50 MG 24 hr tablet TAKE 1 TABLET ONCE DAILY (TAKE  WITH  OR  IMMEDIATELY  FOLLOWING  A  MEAL)  . nitroGLYCERIN (NITROGLYN) 2 % ointment Apply 1 inch topically 4 (four) times daily as needed for chest pain.  Marland Kitchen omeprazole (PRILOSEC) 40 MG capsule Take 1 capsule (40 mg total) by mouth daily. For heartburn.  . predniSONE (DELTASONE) 20 MG tablet 3 tabs by mouth daily x 3 days, then 2 tabs by mouth daily x 2 days then 1 tab by mouth daily x 2 days  . amoxicillin-clavulanate (AUGMENTIN) 875-125 MG tablet Take 1 tablet by mouth 2 (two) times daily for 10 days.   No facility-administered encounter medications on file as of 05/15/2019.

## 2019-05-16 ENCOUNTER — Ambulatory Visit: Payer: Medicare HMO | Admitting: Family Medicine

## 2019-05-27 ENCOUNTER — Ambulatory Visit (INDEPENDENT_AMBULATORY_CARE_PROVIDER_SITE_OTHER): Payer: Medicare HMO | Admitting: Family Medicine

## 2019-05-27 ENCOUNTER — Ambulatory Visit: Payer: Medicare HMO | Admitting: Family Medicine

## 2019-05-27 ENCOUNTER — Other Ambulatory Visit: Payer: Self-pay

## 2019-05-27 ENCOUNTER — Encounter: Payer: Self-pay | Admitting: Family Medicine

## 2019-05-27 DIAGNOSIS — Z9889 Other specified postprocedural states: Secondary | ICD-10-CM | POA: Diagnosis not present

## 2019-05-27 DIAGNOSIS — M542 Cervicalgia: Secondary | ICD-10-CM | POA: Diagnosis not present

## 2019-05-27 MED ORDER — VITAMIN B-12 1000 MCG PO TABS: 1000.0000 ug | ORAL_TABLET | Freq: Every day | ORAL | Status: DC

## 2019-05-27 MED ORDER — GABAPENTIN 100 MG PO CAPS: 100.0000 mg | ORAL_CAPSULE | Freq: Every day | ORAL | 1 refills | Status: DC

## 2019-05-27 NOTE — Patient Instructions (Addendum)
Try restarting gabapentin and update Clark in about 10 days, sooner if needed.  Check with the dental clinic to see if new dentures will help with the jaw pain.   Take care.  Glad to see you.

## 2019-05-27 NOTE — Progress Notes (Signed)
This visit occurred during the SARS-CoV-2 public health emergency.  Safety protocols were in place, including screening questions prior to the visit, additional usage of staff PPE, and extensive cleaning of exam room while observing appropriate contact time as indicated for disinfecting solutions.  She had prev injections re: neck pain w/o relief.  Still with B posterior neck pain.  Off gabapentin in the meantime, that may have helped some.  She has persistent pain but some flares are worse than others.  This is longstanding and at baseline.  We talked about retrial of gabapentin.    B ear pain, R>L, pain most of the time.  No fevers.  Pain with chewing.  Hearing intact per patient report.  Some rhinorrhea, clear.  No ST.  No cough.  Sharp pain near the ears.  Prev ENT eval done.  Prednisone didn't help prev. Already on flonase 2 sprays per nostril per day.    Meds, vitals, and allergies reviewed.   ROS: Per HPI unless specifically indicated in ROS section    nad ncat TMs wnl [place] wnl B B TMJ ttp.   OP wnl Neck supple, with posterior muscle tightness noted but not a stiff neck, no LA rrr

## 2019-05-28 NOTE — Assessment & Plan Note (Signed)
She has dentures.  Unclear if that is contributing to the jaw/TMJ pain.  She is getting new plates in the near future and she will see if that makes a difference. It does appear that her TMJ issues are causing the ear pain, not a primary ear issue.  Discussed with patient. Unclear how much her neck pain is contributing to TMJ pain with muscle tightness.  Discussed. Reasonable to restart gabapentin in the meantime.  Sedation caution and routine medication caution given to patient.  She can update PCP.  Prescription sent.

## 2019-06-04 ENCOUNTER — Encounter: Payer: Self-pay | Admitting: Primary Care

## 2019-06-04 ENCOUNTER — Ambulatory Visit (INDEPENDENT_AMBULATORY_CARE_PROVIDER_SITE_OTHER)
Admission: RE | Admit: 2019-06-04 | Discharge: 2019-06-04 | Disposition: A | Discharge status: Home / Self Care | Payer: Medicare HMO | Source: Ambulatory Visit | Attending: Primary Care | Admitting: Primary Care

## 2019-06-04 ENCOUNTER — Other Ambulatory Visit: Payer: Self-pay

## 2019-06-04 ENCOUNTER — Other Ambulatory Visit: Payer: Self-pay | Admitting: Primary Care

## 2019-06-04 ENCOUNTER — Ambulatory Visit (INDEPENDENT_AMBULATORY_CARE_PROVIDER_SITE_OTHER): Payer: Medicare HMO | Admitting: Primary Care

## 2019-06-04 VITALS — BP 122/76 | HR 75 | Temp 97.8°F | Ht 65.0 in | Wt 143.5 lb

## 2019-06-04 DIAGNOSIS — Z9889 Other specified postprocedural states: Secondary | ICD-10-CM

## 2019-06-04 DIAGNOSIS — M542 Cervicalgia: Secondary | ICD-10-CM

## 2019-06-04 MED ORDER — AMITRIPTYLINE HCL 25 MG PO TABS: 25.0000 mg | ORAL_TABLET | Freq: Every day | ORAL | 0 refills | Status: DC

## 2019-06-04 NOTE — Patient Instructions (Signed)
Start amitriptyline 25 mg once nightly for neck pain. This may cause drowsiness.  Stop gabapentin.   Complete xray(s) prior to leaving today. I will notify you of your results once received.  It was a pleasure to see you today!

## 2019-06-04 NOTE — Assessment & Plan Note (Signed)
No improvement with numerous OTC and Rx treatment. Xray in 2019 with cervical spine changes post fusion which could be contributing.  Start with repeat plain films. Consider repeat MRI but will get records from Dr. Windy Carina office first.  Discussed options for pain treatment, we will trial amitriptyline HS. Stop gabapentin.

## 2019-06-04 NOTE — Progress Notes (Signed)
Subjective:    Patient ID: Valerie [name], female    DOB: 1951/02/28, 69 y.o.   MRN: [id number]  HPI  This visit occurred during the SARS-CoV-2 public health emergency.  Safety protocols were in place, including screening questions prior to the visit, additional usage of staff PPE, and extensive cleaning of exam room while observing appropriate contact time as indicated for disinfecting solutions.   Valerie [name] is a 69 year old female with a history of chronic neck pain, chronic back pain, prediabetes, hyperlipidemia, cervical spinal fusion who presents today with a chief complaint of neck pain.  She was last evaluated for this on 05/27/19 by Dr. Damita Dunnings who encouraged her to restart gabapentin 200 mg HS. She's been using Aspercreme and CBD oil without improvement. The gabapentin hasn't made much of a difference. She's also used Aleve, Ibuprofen, muscle relaxer medications, steroids without improvement.   She's undergone injections of her cervical spine in 2020 per pain management, this didn't help. She was evaluated by neurosurgery in late 2019, thinks she underwent MRI and is unsure of the results. We do not have those records on file. She does have intermittent pain down her left upper extremity. She denies numbness/tingling, acute trauma.   Review of Systems  Musculoskeletal: Positive for arthralgias and neck pain.  Neurological: Negative for weakness and numbness.       Past Medical History:  Diagnosis Date  . Acid reflux   . Adenocarcinoma of the endometrium/uterus ([place]) 12/04/2000  . Anal fissure   . Atrophic vaginitis   . Esophagitis   . Hernia    Present to proximal to umbillical   . Hypertension   . Internal hemorrhoids   . Menorrhagia   . Osteopenia   . PONV (postoperative nausea and vomiting)   . Vertigo   . Vitamin D deficiency      Social History   Socioeconomic History  . Marital status: Married    Spouse name: Not on file  . Number of children: Not on file  .  Years of education: Not on file  . Highest education level: Not on file  Occupational History  . Not on file  Tobacco Use  . Smoking status: Never Smoker  . Smokeless tobacco: Never Used  Substance and Sexual Activity  . Alcohol use: No  . Drug use: Not on file  . Sexual activity: Yes    Birth control/protection: Post-menopausal  Other Topics Concern  . 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   Social Determinants of Health   Financial Resource Strain:   . Difficulty of Paying Living Expenses: Not on file  Food Insecurity:   . Worried About Charity fundraiser in the Last Year: Not on file  . Ran Out of Food in the Last Year: Not on file  Transportation Needs:   . Lack of Transportation (Medical): Not on file  . Lack of Transportation (Non-Medical): Not on file  Physical Activity:   . Days of Exercise per Week: Not on file  . Minutes of Exercise per Session: Not on file  Stress:   . Feeling of Stress : Not on file  Social Connections:   . Frequency of Communication with Friends and Family: Not on file  . Frequency of Social Gatherings with Friends and Family: Not on file  . Attends Religious  Services: Not on file  . Active Member of Clubs or Organizations: Not on file  . Attends Archivist Meetings: Not on file  . Marital Status: Not on file  Intimate Partner Violence:   . Fear of Current or Ex-Partner: Not on file  . Emotionally Abused: Not on file  . Physically Abused: Not on file  . Sexually Abused: Not on file    Past Surgical History:  Procedure Laterality Date  . [place] STUDY N/A 11/29/2015   Procedure: [place] STUDY;  Surgeon: Manus Gunning, MD;  Location: [facility];  Service: Gastroenterology;  Laterality: N/A;  . ABDOMINAL HYSTERECTOMY  12/04/2000   also bilateral salpingo-oophorectomy  . CARDIAC  CATHETERIZATION  2006  . CARPAL TUNNEL RELEASE  11/20/2011   Procedure: CARPAL TUNNEL RELEASE;  Surgeon: Elaina Hoops, MD;  Location: [facility];  Service: Neurosurgery;  Laterality: Left;  LEFT carpal tunnel release  . CERVICAL SPINE SURGERY  2013   [place]  . DILATION AND CURETTAGE OF UTERUS     11/15/2000  . ESOPHAGEAL MANOMETRY N/A 09/20/2015   Procedure: ESOPHAGEAL MANOMETRY (EM);  Surgeon: Manus Gunning, MD;  Location: [facility];  Service: Gastroenterology;  Laterality: N/A;  . HYSTEROSCOPY  11/15/2000   D & C, resection of endometrial polyps  . OOPHORECTOMY  12/04/2000   bilateral salpingo-oophorectomy done with TAH  . TUBAL LIGATION      Family History  Problem Relation Age of Onset  . Cancer Father        prostate cancer  . Hypertension Father   . Hypertension Sister   . Diabetes Brother   . Heart disease Brother        Deceased   . Hypertension Brother   . Diabetes Brother   . Heart disease Brother        Deceased  . Breast cancer Neg Hx     No Known Allergies  Current Outpatient Medications on File Prior to Visit  Medication Sig Dispense Refill  . aspirin EC 81 MG tablet Take 81 mg by mouth daily.    Marland Kitchen atorvastatin (LIPITOR) 10 MG tablet TAKE 1 TABLET ONCE DAILY FOR CHOLESTEROL. 90 tablet 2  . Calcium Carbonate-Vit D-Min (CALTRATE 600+D PLUS PO) Take 1 tablet by mouth 2 (two) times daily.      . fluticasone (FLONASE) 50 MCG/ACT nasal spray Place 2 sprays into both nostrils daily as needed for allergies. 16 g 2  . gabapentin (NEURONTIN) 100 MG capsule Take 1-2 capsules (100-200 mg total) by mouth at bedtime. Sedation precautions 90 capsule 1  . levocetirizine (XYZAL) 5 MG tablet Take 1 tablet by mouth every evening for allergies. 90 tablet 0  . losartan (COZAAR) 100 MG tablet TAKE 1 TABLET ONCE DAILY FOR BLOOD PRESSURE. 90 tablet 2  . metoprolol succinate (TOPROL-XL) 50 MG 24 hr tablet TAKE 1 TABLET ONCE DAILY (TAKE  WITH  OR   IMMEDIATELY  FOLLOWING  A  MEAL) 90 tablet 2  . nitroGLYCERIN (NITROGLYN) 2 % ointment Apply 1 inch topically 4 (four) times daily as needed for chest pain.    Marland Kitchen omeprazole (PRILOSEC) 40 MG capsule Take 1 capsule (40 mg total) by mouth daily. For heartburn. 90 capsule 3  . vitamin B-12 (CYANOCOBALAMIN) 1000 MCG tablet Take 1 tablet (1,000 mcg total) by mouth daily.     No current facility-administered medications on file prior to visit.    BP 122/76   Pulse  75   Temp 97.8 F (36.6 C) (Temporal)   Ht 5\' 5"  (1.651 m)   Wt 143 lb 8 oz (65.1 kg)   SpO2 98%   BMI 23.88 kg/m    Objective:   Physical Exam  Constitutional: She appears well-nourished.  Neck:    Decrease in ROM in most directions due to pain  Respiratory: Effort normal.  Musculoskeletal:     Cervical back: No erythema. No spinous process tenderness. Decreased range of motion.  Skin: Skin is warm and dry.           Assessment & Plan:

## 2019-06-20 ENCOUNTER — Ambulatory Visit: Payer: Medicare HMO | Attending: Internal Medicine

## 2019-06-20 DIAGNOSIS — Z23 Encounter for immunization: Secondary | ICD-10-CM | POA: Insufficient documentation

## 2019-06-20 NOTE — Progress Notes (Signed)
   Covid-19 Vaccination Clinic  Name:  Valerie [name]    MRN: [id number] DOB: 1950/07/12  06/20/2019  [name] was observed post Covid-19 immunization for 15 minutes without incidence. She was provided with Vaccine Information Sheet and instruction to access the V-Safe system.   [name] was instructed to call 911 with any severe reactions post vaccine: Marland Kitchen Difficulty breathing  . Swelling of your face and throat  . A fast heartbeat  . A bad rash all over your body  . Dizziness and weakness    Immunizations Administered    Name Date Dose VIS Date Route   Pfizer COVID-19 Vaccine 06/20/2019  4:36 PM 0.3 mL 05/09/2019 Intramuscular   Manufacturer: [place]   Lot: [id number]   [place]: [id number]

## 2019-06-25 ENCOUNTER — Other Ambulatory Visit: Payer: Self-pay | Admitting: Student

## 2019-06-25 DIAGNOSIS — Z981 Arthrodesis status: Secondary | ICD-10-CM

## 2019-06-25 DIAGNOSIS — M542 Cervicalgia: Secondary | ICD-10-CM

## 2019-06-29 ENCOUNTER — Other Ambulatory Visit: Payer: Self-pay | Admitting: Primary Care

## 2019-06-29 DIAGNOSIS — E538 Deficiency of other specified B group vitamins: Secondary | ICD-10-CM

## 2019-07-03 ENCOUNTER — Ambulatory Visit: Payer: Medicare HMO

## 2019-07-07 ENCOUNTER — Other Ambulatory Visit (INDEPENDENT_AMBULATORY_CARE_PROVIDER_SITE_OTHER): Payer: Medicare HMO

## 2019-07-07 ENCOUNTER — Other Ambulatory Visit: Payer: Self-pay

## 2019-07-07 DIAGNOSIS — E538 Deficiency of other specified B group vitamins: Secondary | ICD-10-CM | POA: Diagnosis not present

## 2019-07-07 LAB — CBC
HCT: 40.3 % (ref 36.0–46.0)
Hemoglobin: 13.4 g/dL (ref 12.0–15.0)
MCHC: 33.3 g/dL (ref 30.0–36.0)
MCV: 96.6 fl (ref 78.0–100.0)
Platelets: 184 10*3/uL (ref 150.0–400.0)
RBC: 4.17 Mil/uL (ref 3.87–5.11)
RDW: 14 % (ref 11.5–15.5)
WBC: 5.7 10*3/uL (ref 4.0–10.5)

## 2019-07-07 LAB — VITAMIN B12: Vitamin B-12: 485 pg/mL (ref 211–911)

## 2019-07-11 ENCOUNTER — Ambulatory Visit: Payer: Medicare HMO | Attending: Internal Medicine

## 2019-07-11 DIAGNOSIS — Z23 Encounter for immunization: Secondary | ICD-10-CM | POA: Insufficient documentation

## 2019-07-11 NOTE — Progress Notes (Signed)
   Covid-19 Vaccination Clinic  Name:  Valerie [name]    MRN: [id number] DOB: [birth year]  07/11/2019  [name] was observed post Covid-19 immunization for 15 minutes without incidence. She was provided with Vaccine Information Sheet and instruction to access the V-Safe system.   [name] was instructed to call 911 with any severe reactions post vaccine: Marland Kitchen Difficulty breathing  . Swelling of your face and throat  . A fast heartbeat  . A bad rash all over your body  . Dizziness and weakness    Immunizations Administered    Name Date Dose VIS Date Route   Pfizer COVID-19 Vaccine 07/11/2019  2:37 PM 0.3 mL 05/09/2019 Intramuscular   Manufacturer: [place]   Lot: [id number]   [place]: [id number]

## 2019-07-14 ENCOUNTER — Ambulatory Visit
Admission: RE | Admit: 2019-07-14 | Discharge: 2019-07-14 | Disposition: A | Discharge status: Home / Self Care | Payer: Medicare HMO | Source: Ambulatory Visit | Attending: Student | Admitting: Student

## 2019-07-14 ENCOUNTER — Other Ambulatory Visit: Payer: Self-pay

## 2019-07-14 DIAGNOSIS — Z981 Arthrodesis status: Secondary | ICD-10-CM | POA: Diagnosis not present

## 2019-07-14 DIAGNOSIS — M542 Cervicalgia: Secondary | ICD-10-CM | POA: Insufficient documentation

## 2019-10-02 ENCOUNTER — Ambulatory Visit (INDEPENDENT_AMBULATORY_CARE_PROVIDER_SITE_OTHER): Payer: Medicare HMO | Admitting: Primary Care

## 2019-10-02 ENCOUNTER — Other Ambulatory Visit: Payer: Self-pay

## 2019-10-02 ENCOUNTER — Encounter: Payer: Self-pay | Admitting: Primary Care

## 2019-10-02 VITALS — BP 122/74 | HR 94 | Temp 96.1°F | Ht 65.0 in | Wt 146.5 lb

## 2019-10-02 DIAGNOSIS — H9313 Tinnitus, bilateral: Secondary | ICD-10-CM

## 2019-10-02 DIAGNOSIS — J309 Allergic rhinitis, unspecified: Secondary | ICD-10-CM

## 2019-10-02 MED ORDER — PREDNISONE 20 MG PO TABS: ORAL_TABLET | ORAL | 0 refills | Status: DC

## 2019-10-02 NOTE — Progress Notes (Signed)
Subjective:    Patient ID: Valerie [name], female    DOB: [birth year], 69 y.o.   MRN: [id number]  HPI  This visit occurred during the SARS-CoV-2 public health emergency.  Safety protocols were in place, including screening questions prior to the visit, additional usage of staff PPE, and extensive cleaning of exam room while observing appropriate contact time as indicated for disinfecting solutions.   Valerie [name] is a 69 year old female with a history of hypertension, GERD, BPPV, eustachian tube dysfunction, allergic rhinitis, chronic neck pain who presents today with a chief complaint of tinnitus.  She is also reporting ear pressure and rhinorrhea.. She is managed on aspirin 81 mg daily. Her ringing is located bilaterally, but worse to the right ear. Symptoms began about 2-3 weeks ago since active in the yard mowing and tending to outdoor chores.   She's been taking Ibuprofen with little improvement. She is not taking any antihistamines, but is complaint to Flonase daily.   BP Readings from Last 3 Encounters:  10/02/19 122/74  06/04/19 122/76  05/27/19 126/76   Wt Readings from Last 3 Encounters:  10/02/19 146 lb 8 oz (66.5 kg)  06/04/19 143 lb 8 oz (65.1 kg)  05/27/19 142 lb (64.4 kg)     Review of Systems  Constitutional: Negative for fever.  HENT: Positive for ear pain, rhinorrhea, sneezing and tinnitus.   Respiratory: Negative for cough.   Allergic/Immunologic: Positive for environmental allergies.       Past Medical History:  Diagnosis Date  . Acid reflux   . Adenocarcinoma of the endometrium/uterus ([place]) 12/04/2000  . Anal fissure   . Atrophic vaginitis   . Esophagitis   . Hernia    Present to proximal to umbillical   . Hypertension   . Internal hemorrhoids   . Menorrhagia   . Osteopenia   . PONV (postoperative nausea and vomiting)   . Vertigo   . Vitamin D deficiency      Social History   Socioeconomic History  . Marital status: Married    Spouse name: Not on  file  . Number of children: Not on file  . Years of education: Not on file  . Highest education level: Not on file  Occupational History  . Not on file  Tobacco Use  . Smoking status: Never Smoker  . Smokeless tobacco: Never Used  Substance and Sexual Activity  . Alcohol use: No  . Drug use: Not on file  . Sexual activity: Yes    Birth control/protection: Post-menopausal  Other Topics Concern  . 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   Social Determinants of Health   Financial Resource Strain:   . Difficulty of Paying Living Expenses:   Food Insecurity:   . Worried About Charity fundraiser in the Last Year:   . Arboriculturist in the Last Year:   Transportation Needs:   . Film/video editor (Medical):   Marland Kitchen Lack of Transportation (Non-Medical):   Physical Activity:   . Days of Exercise per Week:   . Minutes of Exercise per Session:   Stress:   . Feeling of Stress :   Social Connections:   . Frequency of Communication with Friends and Family:   . Frequency of Social Gatherings with Friends and Family:   . Attends Religious  Services:   . Active Member of Clubs or Organizations:   . Attends Archivist Meetings:   Marland Kitchen Marital Status:   Intimate Partner Violence:   . Fear of Current or Ex-Partner:   . Emotionally Abused:   Marland Kitchen Physically Abused:   . Sexually Abused:     Past Surgical History:  Procedure Laterality Date  . [place] STUDY N/A 11/29/2015   Procedure: [place] STUDY;  Surgeon: Manus Gunning, MD;  Location: [facility];  Service: Gastroenterology;  Laterality: N/A;  . ABDOMINAL HYSTERECTOMY  12/04/2000   also bilateral salpingo-oophorectomy  . CARDIAC CATHETERIZATION  2006  . CARPAL TUNNEL RELEASE  11/20/2011   Procedure: CARPAL TUNNEL RELEASE;  Surgeon: Elaina Hoops, MD;  Location: [facility];  Service:  Neurosurgery;  Laterality: Left;  LEFT carpal tunnel release  . CERVICAL SPINE SURGERY  2013   [place]  . DILATION AND CURETTAGE OF UTERUS     11/15/2000  . ESOPHAGEAL MANOMETRY N/A 09/20/2015   Procedure: ESOPHAGEAL MANOMETRY (EM);  Surgeon: Manus Gunning, MD;  Location: [facility];  Service: Gastroenterology;  Laterality: N/A;  . HYSTEROSCOPY  11/15/2000   D & C, resection of endometrial polyps  . OOPHORECTOMY  12/04/2000   bilateral salpingo-oophorectomy done with TAH  . TUBAL LIGATION      Family History  Problem Relation Age of Onset  . Hypertension Father   . Prostate cancer Father   . Hypertension Sister   . Diabetes Brother   . Heart disease Brother   . Hypertension Brother   . Diabetes Brother   . Heart disease Brother   . Breast cancer Neg Hx     No Known Allergies  Current Outpatient Medications on File Prior to Visit  Medication Sig Dispense Refill  . amitriptyline (ELAVIL) 25 MG tablet Take 1 tablet (25 mg total) by mouth at bedtime. For pain. 30 tablet 0  . aspirin EC 81 MG tablet Take 81 mg by mouth daily.    Marland Kitchen atorvastatin (LIPITOR) 10 MG tablet TAKE 1 TABLET ONCE DAILY FOR CHOLESTEROL. 90 tablet 2  . Calcium Carbonate-Vit D-Min (CALTRATE 600+D PLUS PO) Take 1 tablet by mouth 2 (two) times daily.      . fluticasone (FLONASE) 50 MCG/ACT nasal spray Place 2 sprays into both nostrils daily as needed for allergies. 16 g 2  . levocetirizine (XYZAL) 5 MG tablet Take 1 tablet by mouth every evening for allergies. 90 tablet 0  . losartan (COZAAR) 100 MG tablet TAKE 1 TABLET ONCE DAILY FOR BLOOD PRESSURE. 90 tablet 2  . metoprolol succinate (TOPROL-XL) 50 MG 24 hr tablet TAKE 1 TABLET ONCE DAILY (TAKE  WITH  OR  IMMEDIATELY  FOLLOWING  A  MEAL) 90 tablet 2  . nitroGLYCERIN (NITROGLYN) 2 % ointment Apply 1 inch topically 4 (four) times daily as needed for chest pain.    Marland Kitchen omeprazole (PRILOSEC) 40 MG capsule Take 1 capsule (40 mg total) by  mouth daily. For heartburn. 90 capsule 3  . vitamin B-12 (CYANOCOBALAMIN) 1000 MCG tablet Take 1 tablet (1,000 mcg total) by mouth daily.     No current facility-administered medications on file prior to visit.    BP 122/74   Pulse 94   Temp (!) 96.1 F (35.6 C) (Temporal)   Ht 5\' 5"  (1.651 m)   Wt 146 lb 8 oz (66.5 kg)   SpO2 98%   BMI 24.38 kg/m  Objective:   Physical Exam  Constitutional: She appears well-nourished.  HENT:  Right Ear: Tympanic membrane is bulging. Tympanic membrane is not erythematous.  Left Ear: Tympanic membrane is bulging. Tympanic membrane is not erythematous.  Respiratory: Effort normal.           Assessment & Plan:

## 2019-10-02 NOTE — Assessment & Plan Note (Signed)
Suspect triggered by seasonal allergies.  Bulging evident on exam, no infection.  Stop aspirin. Rx for prednisone burst provided. Resume Xyzal.  She will update.

## 2019-10-02 NOTE — Assessment & Plan Note (Signed)
Suspect tinnitus could be triggered by seasonal allergies. Ear exam with bulging without infection.  Discussed to resume Xyzal, continue Flonase.  Rx for prednisone burst provided.  Discussed to hold aspirin.  She will update.

## 2019-10-02 NOTE — Patient Instructions (Signed)
Start prednisone. Take 2 tablets daily for five days for allergies and ear pressure.  Continue Flonase.  Resume your Xyzal for allergies.  It was a pleasure to see you today!

## 2019-10-21 ENCOUNTER — Ambulatory Visit (INDEPENDENT_AMBULATORY_CARE_PROVIDER_SITE_OTHER): Payer: Medicare HMO

## 2019-10-21 VITALS — BP 122/74 | Ht 65.0 in | Wt 146.5 lb

## 2019-10-21 DIAGNOSIS — Z0000 Encounter for general adult medical examination without abnormal findings: Secondary | ICD-10-CM

## 2019-10-21 NOTE — Progress Notes (Signed)
Subjective:   Valerie [name] is a 69 y.o. female who presents for an Initial Medicare Annual Wellness Visit.  Review of Systems: N/A      I connected with the patient today by telephone and verified that I am speaking with the correct person using two identifiers. Location patient: home Location nurse: work Persons participating in the virtual visit: patient, Marine scientist.   I discussed the limitations, risks, security and privacy concerns of performing an evaluation and management service by telephone and the availability of in person appointments. I also discussed with the patient that there may be a patient responsible charge related to this service. The patient expressed understanding and verbally consented to this telephonic visit.    Interactive audio and video telecommunications were attempted between this nurse and patient, however failed, due to patient having technical difficulties OR patient did not have access to video capability.  We continued and completed visit with audio only.      Cardiac Risk Factors include: advanced age (>35men, >6 women);dyslipidemia;hypertension     Objective:    Today's Vitals   10/21/19 1155 10/21/19 1200  BP: 122/74   Weight: 146 lb 8 oz (66.5 kg)   Height: 5\' 5"  (1.651 m)   PainSc:  10-Worst pain ever   Body mass index is 24.38 kg/m.  Advanced Directives 10/21/2019 11/15/2014 11/20/2011  Does Patient Have a Medical Advance Directive? No No Patient does not have advance directive  Would patient like information on creating a medical advance directive? No - Patient declined No - patient declined information -    Current Medications (verified) Outpatient Encounter Medications as of 10/21/2019  Medication Sig  . amitriptyline (ELAVIL) 25 MG tablet Take 1 tablet (25 mg total) by mouth at bedtime. For pain.  Marland Kitchen aspirin EC 81 MG tablet Take 81 mg by mouth daily.  Marland Kitchen atorvastatin (LIPITOR) 10 MG tablet TAKE 1 TABLET ONCE DAILY FOR CHOLESTEROL.  .  Calcium Carbonate-Vit D-Min (CALTRATE 600+D PLUS PO) Take 1 tablet by mouth 2 (two) times daily.    . fluticasone (FLONASE) 50 MCG/ACT nasal spray Place 2 sprays into both nostrils daily as needed for allergies.  Marland Kitchen levocetirizine (XYZAL) 5 MG tablet Take 1 tablet by mouth every evening for allergies.  Marland Kitchen losartan (COZAAR) 100 MG tablet TAKE 1 TABLET ONCE DAILY FOR BLOOD PRESSURE.  . metoprolol succinate (TOPROL-XL) 50 MG 24 hr tablet TAKE 1 TABLET ONCE DAILY (TAKE  WITH  OR  IMMEDIATELY  FOLLOWING  A  MEAL)  . nitroGLYCERIN (NITROGLYN) 2 % ointment Apply 1 inch topically 4 (four) times daily as needed for chest pain.  Marland Kitchen omeprazole (PRILOSEC) 40 MG capsule Take 1 capsule (40 mg total) by mouth daily. For heartburn.  . predniSONE (DELTASONE) 20 MG tablet Take 2 tablets daily for five days.  . vitamin B-12 (CYANOCOBALAMIN) 1000 MCG tablet Take 1 tablet (1,000 mcg total) by mouth daily.   No facility-administered encounter medications on file as of 10/21/2019.    Allergies (verified) Patient has no known allergies.   History: Past Medical History:  Diagnosis Date  . Acid reflux   . Adenocarcinoma of the endometrium/uterus ([place]) 12/04/2000  . Anal fissure   . Atrophic vaginitis   . Esophagitis   . Hernia    Present to proximal to umbillical   . Hypertension   . Internal hemorrhoids   . Menorrhagia   . Osteopenia   . PONV (postoperative nausea and vomiting)   . Vertigo   . Vitamin  D deficiency    Past Surgical History:  Procedure Laterality Date  . [place] STUDY N/A 11/29/2015   Procedure: [place] STUDY;  Surgeon: Manus Gunning, MD;  Location: [facility];  Service: Gastroenterology;  Laterality: N/A;  . ABDOMINAL HYSTERECTOMY  12/04/2000   also bilateral salpingo-oophorectomy  . CARDIAC CATHETERIZATION  2006  . CARPAL TUNNEL RELEASE  11/20/2011   Procedure: CARPAL TUNNEL RELEASE;  Surgeon: Elaina Hoops, MD;  Location: [place] [facility];  Service: Neurosurgery;  Laterality:  Left;  LEFT carpal tunnel release  . CERVICAL SPINE SURGERY  2013   [place]  . DILATION AND CURETTAGE OF UTERUS     11/15/2000  . ESOPHAGEAL MANOMETRY N/A 09/20/2015   Procedure: ESOPHAGEAL MANOMETRY (EM);  Surgeon: Manus Gunning, MD;  Location: [facility];  Service: Gastroenterology;  Laterality: N/A;  . HYSTEROSCOPY  11/15/2000   D & C, resection of endometrial polyps  . OOPHORECTOMY  12/04/2000   bilateral salpingo-oophorectomy done with TAH  . TUBAL LIGATION     Family History  Problem Relation Age of Onset  . Hypertension Father   . Prostate cancer Father   . Hypertension Sister   . Diabetes Brother   . Heart disease Brother   . Hypertension Brother   . Diabetes Brother   . Heart disease Brother   . Breast cancer Neg Hx    Social History   Socioeconomic History  . Marital status: Married    Spouse name: Not on file  . Number of children: Not on file  . Years of education: Not on file  . Highest education level: Not on file  Occupational History  . Not on file  Tobacco Use  . Smoking status: Never Smoker  . Smokeless tobacco: Never Used  Substance and Sexual Activity  . Alcohol use: No  . Drug use: Not on file  . Sexual activity: Yes    Birth control/protection: Post-menopausal  Other Topics Concern  . 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   Social Determinants of Health   Financial Resource Strain: Low Risk   . Difficulty of Paying Living Expenses: Not hard at all  Food Insecurity: No Food Insecurity  . Worried About Charity fundraiser in the Last Year: Never true  . Ran Out of Food in the Last Year: Never true  Transportation Needs: No Transportation Needs  . Lack of Transportation (Medical): No  . Lack of Transportation (Non-Medical): No  Physical Activity: Inactive  . Days of Exercise per Week:  0 days  . Minutes of Exercise per Session: 0 min  Stress: No Stress Concern Present  . Feeling of Stress : Not at all  Social Connections:   . Frequency of Communication with Friends and Family:   . Frequency of Social Gatherings with Friends and Family:   . Attends Religious Services:   . Active Member of Clubs or Organizations:   . Attends Archivist Meetings:   Marland Kitchen Marital Status:     Tobacco Counseling Counseling given: Not Answered   Clinical Intake:  Pre-visit preparation completed: Yes  Pain : 0-10 Pain Score: 10-Worst pain ever Pain Type: Chronic pain Pain Location: Neck Pain Descriptors / Indicators: Aching Pain Onset: More than a month ago Pain Frequency: Constant  Nutritional Risks: None Diabetes: No  How often do you need to have someone help you when you read instructions, pamphlets, or other written materials from your doctor or pharmacy?: 1 - Never What is the last grade level you completed in school?: 10th  Interpreter Needed?: No  Information entered by :: CJohnson, LPN   Activities of Daily Living In your present state of health, do you have any difficulty performing the following activities: 10/21/2019  Hearing? N  Vision? N  Difficulty concentrating or making decisions? N  Walking or climbing stairs? N  Dressing or bathing? N  Doing errands, shopping? N  Preparing Food and eating ? N  Using the Toilet? N  In the past six months, have you accidently leaked urine? N  Do you have problems with loss of bowel control? N  Managing your Medications? N  Managing your Finances? N  Housekeeping or managing your Housekeeping? N  Some recent data might be hidden     Immunizations and Health Maintenance Immunization History  Administered Date(s) Administered  . Fluad Quad(high Dose 65+) 02/14/2019  . Hepatitis B 11/25/2015, 05/29/2016  . Hepatitis B, adult 09/22/2015  . Influenza, High Dose Seasonal PF 05/29/2016  .  Influenza,inj,Quad PF,6+ Mos 02/11/2018  . Influenza-Unspecified 02/27/2015  . PFIZER SARS-COV-2 Vaccination 06/20/2019, 07/11/2019  . Td 05/29/1997  . Tdap 10/09/2014, 09/22/2015  . Zoster 07/28/2015   Health Maintenance Due  Topic Date Due  . PNA vac Low Risk Adult (1 of 2 - PCV13) Never done  . PAP SMEAR-Modifier  01/02/2019    Patient Care Team: Pleas Koch, NP as PCP - General (Nurse Practitioner)  Indicate any recent Medical Services you may have received from other than [facility] providers in the past year (date may be approximate).     Assessment:   This is a routine wellness examination for [place].  Hearing/Vision screen  Hearing Screening   125Hz  250Hz  500Hz  1000Hz  2000Hz  3000Hz  4000Hz  6000Hz  8000Hz   Right ear:           Left ear:           Vision Screening Comments: Patient gets annual eye exams   Dietary issues and exercise activities discussed: Current Exercise Habits: The patient does not participate in regular exercise at present, Exercise limited by: None identified  Goals    . Patient Stated     10/21/2019, I will maintain and continue medications as prescribed.       Depression Screen PHQ 2/9 Scores 10/21/2019 02/11/2018 01/01/2018 12/26/2016 11/16/2016  PHQ - 2 Score 0 0 0 0 1  PHQ- 9 Score 0 - 0 0 -    Fall Risk Fall Risk  10/21/2019 02/11/2018  Falls in the past year? 0 No  Number falls in past yr: 0 -  Injury with Fall? 0 -  Risk for fall due to : Medication side effect -  Follow up Falls evaluation completed;Falls prevention discussed -    Is the patient's home free of loose throw rugs in walkways, pet beds, electrical cords, etc?   yes      Grab bars in the bathroom? no      Handrails on the stairs?   yes      Adequate lighting?   yes  Timed Get Up and Go Performed: N/A  Cognitive Function: MMSE - Mini Mental State Exam 10/21/2019  Orientation to time 5  Orientation to Place 5  Registration 3  Attention/ Calculation 5  Recall 3  Language- repeat 1       Mini Cog  Mini-Cog screen was completed. Maximum score is 22. A value of 0 denotes this part of the MMSE was not completed or the patient failed this part of the Mini-Cog screening.  Screening Tests Health Maintenance  Topic Date Due  . PNA vac Low Risk Adult (1 of 2 - PCV13) Never done  . PAP SMEAR-Modifier  01/02/2019  . COLONOSCOPY  12/16/2019  . INFLUENZA VACCINE  12/28/2019  . MAMMOGRAM  01/07/2021  . TETANUS/TDAP  09/21/2025  . COVID-19 Vaccine  Completed  . Hepatitis C Screening  Completed    Qualifies for Shingles Vaccine: yes  Cancer Screenings: Lung: Low Dose CT Chest recommended if Age 24-80 years, 30 pack-year currently smoking OR have quit w/in 15 years. Patient does not qualify. Breast: Up to date on MammogramYes, completed 01/16/2019  Up to date of Bone Density/Dexa: Yes, completed 05/07/2018 Colorectal: completed 12/15/2009  Additional Screenings:  Hepatitis C Screening: 11/05/2015     Plan:    Patient will maintain and continue medications as prescribed.   I have personally reviewed and noted the following in the patient's chart:   . Medical and social history . Use of alcohol, tobacco or illicit drugs  . Current medications and supplements . Functional ability and status . Nutritional status . Physical activity . Advanced directives . List of other physicians . Hospitalizations, surgeries, and ER visits in previous 12 months . Vitals . Screenings to include cognitive, depression, and falls . Referrals and appointments  In addition, I have reviewed and discussed with patient certain preventive protocols, quality metrics, and best practice recommendations. A written personalized care plan for preventive services as well as general preventive health recommendations were provided to patient.     Andrez Grime, LPN   [id number]

## 2019-10-21 NOTE — Patient Instructions (Addendum)
Valerie [name] , Thank you for taking time to come for your Medicare Wellness Visit. I appreciate your ongoing commitment to your health goals. Please review the following plan we discussed and let me know if I can assist you in the future.   Screening recommendations/referrals: Colonoscopy: Up to date, completed 12/15/2009 Mammogram: Up to date, completed 01/08/2019 Bone Density: Up to date, completed 05/07/2018 Recommended yearly ophthalmology/optometry visit for glaucoma screening and checkup Recommended yearly dental visit for hygiene and checkup  Vaccinations: Influenza vaccine: Up to date, completed 02/14/2019 Pneumococcal vaccine: due Tdap vaccine: Up to date, completed 09/22/2015 Shingles vaccine: discussed    Advanced directives: Advance directive discussed with you today. Even though you declined this today please call our office should you change your mind and we can give you the proper paperwork for you to fill out.  Conditions/risks identified: hypertension, hyperlipidemia  Next appointment: none   Preventive Care 67 Years and Older, Female Preventive care refers to lifestyle choices and visits with your health care provider that can promote health and wellness. What does preventive care include?  A yearly physical exam. This is also called an annual well check.  Dental exams once or twice a year.  Routine eye exams. Ask your health care provider how often you should have your eyes checked.  Personal lifestyle choices, including:  Daily care of your teeth and gums.  Regular physical activity.  Eating a healthy diet.  Avoiding tobacco and drug use.  Limiting alcohol use.  Practicing safe sex.  Taking low-dose aspirin every day.  Taking vitamin and mineral supplements as recommended by your health care provider. What happens during an annual well check? The services and screenings done by your health care provider during your annual well check will depend on your  age, overall health, lifestyle risk factors, and family history of disease. Counseling  Your health care provider may ask you questions about your:  Alcohol use.  Tobacco use.  Drug use.  Emotional well-being.  Home and relationship well-being.  Sexual activity.  Eating habits.  History of falls.  Memory and ability to understand (cognition).  Work and work Statistician.  Reproductive health. Screening  You may have the following tests or measurements:  Height, weight, and BMI.  Blood pressure.  Lipid and cholesterol levels. These may be checked every 5 years, or more frequently if you are over 52 years old.  Skin check.  Lung cancer screening. You may have this screening every year starting at age 50 if you have a 30-pack-year history of smoking and currently smoke or have quit within the past 15 years.  Fecal occult blood test (FOBT) of the stool. You may have this test every year starting at age 59.  Flexible sigmoidoscopy or colonoscopy. You may have a sigmoidoscopy every 5 years or a colonoscopy every 10 years starting at age 2.  Hepatitis C blood test.  Hepatitis B blood test.  Sexually transmitted disease (STD) testing.  Diabetes screening. This is done by checking your blood sugar (glucose) after you have not eaten for a while (fasting). You may have this done every 1-3 years.  Bone density scan. This is done to screen for osteoporosis. You may have this done starting at age 11.  Mammogram. This may be done every 1-2 years. Talk to your health care provider about how often you should have regular mammograms. Talk with your health care provider about your test results, treatment options, and if necessary, the need for more tests. Vaccines  Your health care provider may recommend certain vaccines, such as:  Influenza vaccine. This is recommended every year.  Tetanus, diphtheria, and acellular pertussis (Tdap, Td) vaccine. You may need a Td booster  every 10 years.  Zoster vaccine. You may need this after age 31.  Pneumococcal 13-valent conjugate (PCV13) vaccine. One dose is recommended after age 67.  Pneumococcal polysaccharide (PPSV23) vaccine. One dose is recommended after age 60. Talk to your health care provider about which screenings and vaccines you need and how often you need them. This information is not intended to replace advice given to you by your health care provider. Make sure you discuss any questions you have with your health care provider. Document Released: 06/11/2015 Document Revised: 02/02/2016 Document Reviewed: 03/16/2015 Elsevier Interactive Patient Education  2017 [place] Prevention in the Home Falls can cause injuries. They can happen to people of all ages. There are many things you can do to make your home safe and to help prevent falls. What can I do on the outside of my home?  Regularly fix the edges of walkways and driveways and fix any cracks.  Remove anything that might make you trip as you walk through a door, such as a raised step or threshold.  Trim any bushes or trees on the path to your home.  Use bright outdoor lighting.  Clear any walking paths of anything that might make someone trip, such as rocks or tools.  Regularly check to see if handrails are loose or broken. Make sure that both sides of any steps have handrails.  Any raised decks and porches should have guardrails on the edges.  Have any leaves, snow, or ice cleared regularly.  Use sand or salt on walking paths during winter.  Clean up any spills in your garage right away. This includes oil or grease spills. What can I do in the bathroom?  Use night lights.  Install grab bars by the toilet and in the tub and shower. Do not use towel bars as grab bars.  Use non-skid mats or decals in the tub or shower.  If you need to sit down in the shower, use a plastic, non-slip stool.  Keep the floor dry. Clean up any  water that spills on the floor as soon as it happens.  Remove soap buildup in the tub or shower regularly.  Attach bath mats securely with double-sided non-slip rug tape.  Do not have throw rugs and other things on the floor that can make you trip. What can I do in the bedroom?  Use night lights.  Make sure that you have a light by your bed that is easy to reach.  Do not use any sheets or blankets that are too big for your bed. They should not hang down onto the floor.  Have a firm chair that has side arms. You can use this for support while you get dressed.  Do not have throw rugs and other things on the floor that can make you trip. What can I do in the kitchen?  Clean up any spills right away.  Avoid walking on wet floors.  Keep items that you use a lot in easy-to-reach places.  If you need to reach something above you, use a strong step stool that has a grab bar.  Keep electrical cords out of the way.  Do not use floor polish or wax that makes floors slippery. If you must use wax, use non-skid floor wax.  Do  not have throw rugs and other things on the floor that can make you trip. What can I do with my stairs?  Do not leave any items on the stairs.  Make sure that there are handrails on both sides of the stairs and use them. Fix handrails that are broken or loose. Make sure that handrails are as long as the stairways.  Check any carpeting to make sure that it is firmly attached to the stairs. Fix any carpet that is loose or worn.  Avoid having throw rugs at the top or bottom of the stairs. If you do have throw rugs, attach them to the floor with carpet tape.  Make sure that you have a light switch at the top of the stairs and the bottom of the stairs. If you do not have them, ask someone to add them for you. What else can I do to help prevent falls?  Wear shoes that:  Do not have high heels.  Have rubber bottoms.  Are comfortable and fit you well.  Are closed  at the toe. Do not wear sandals.  If you use a stepladder:  Make sure that it is fully opened. Do not climb a closed stepladder.  Make sure that both sides of the stepladder are locked into place.  Ask someone to hold it for you, if possible.  Clearly mark and make sure that you can see:  Any grab bars or handrails.  First and last steps.  Where the edge of each step is.  Use tools that help you move around (mobility aids) if they are needed. These include:  Canes.  Walkers.  Scooters.  Crutches.  Turn on the lights when you go into a dark area. Replace any light bulbs as soon as they burn out.  Set up your furniture so you have a clear path. Avoid moving your furniture around.  If any of your floors are uneven, fix them.  If there are any pets around you, be aware of where they are.  Review your medicines with your doctor. Some medicines can make you feel dizzy. This can increase your chance of falling. Ask your doctor what other things that you can do to help prevent falls. This information is not intended to replace advice given to you by your health care provider. Make sure you discuss any questions you have with your health care provider. Document Released: 03/11/2009 Document Revised: 10/21/2015 Document Reviewed: 06/19/2014 Elsevier Interactive Patient Education  2017 [employer].

## 2019-10-21 NOTE — Progress Notes (Signed)
PCP notes:  Health Maintenance: No gaps noted- Patient states she had pneumonia vaccines completed at [facility] in the past.   Abnormal Screenings: none   Patient concerns: Needs referral for screening colonoscopy in July   Nurse concerns: none   Next PCP appt.: none

## 2019-10-30 ENCOUNTER — Other Ambulatory Visit: Payer: Self-pay | Admitting: Primary Care

## 2019-10-30 DIAGNOSIS — I10 Essential (primary) hypertension: Secondary | ICD-10-CM

## 2019-10-30 DIAGNOSIS — E785 Hyperlipidemia, unspecified: Secondary | ICD-10-CM

## 2019-10-31 ENCOUNTER — Encounter: Payer: Self-pay | Admitting: Family Medicine

## 2019-10-31 ENCOUNTER — Other Ambulatory Visit: Payer: Self-pay

## 2019-10-31 ENCOUNTER — Ambulatory Visit (INDEPENDENT_AMBULATORY_CARE_PROVIDER_SITE_OTHER)
Admission: RE | Admit: 2019-10-31 | Discharge: 2019-10-31 | Disposition: A | Discharge status: Home / Self Care | Payer: Medicare HMO | Source: Ambulatory Visit | Attending: Family Medicine | Admitting: Family Medicine

## 2019-10-31 ENCOUNTER — Ambulatory Visit (INDEPENDENT_AMBULATORY_CARE_PROVIDER_SITE_OTHER): Payer: Medicare HMO | Admitting: Family Medicine

## 2019-10-31 ENCOUNTER — Other Ambulatory Visit: Payer: Self-pay | Admitting: Primary Care

## 2019-10-31 VITALS — BP 110/70 | HR 80 | Temp 97.7°F | Wt 146.0 lb

## 2019-10-31 DIAGNOSIS — M25461 Effusion, right knee: Secondary | ICD-10-CM

## 2019-10-31 DIAGNOSIS — I10 Essential (primary) hypertension: Secondary | ICD-10-CM

## 2019-10-31 DIAGNOSIS — M25561 Pain in right knee: Secondary | ICD-10-CM

## 2019-10-31 MED ORDER — TRAMADOL HCL 50 MG PO TABS: 50.0000 mg | ORAL_TABLET | Freq: Two times a day (BID) | ORAL | 0 refills | Status: AC | PRN

## 2019-10-31 MED ORDER — METOPROLOL SUCCINATE ER 50 MG PO TB24: ORAL_TABLET | ORAL | 1 refills | Status: DC

## 2019-10-31 NOTE — Progress Notes (Signed)
Subjective:    Patient ID: Valerie [name], female    DOB: 08/03/1950, 69 y.o.   MRN: [id number]  HPI Chief Complaint  Patient presents with  . Leg Pain    Right leg pain, starting at knee down to foot. Pt states that pain worsens sometimes by the end of the day, knee cap is sore. Taking Ibuprofen and Aleve for pain. very little swelling to knee   Having shooting pain in knee to foot for last 3-4 days, worsening. Pain is constant, especially at night. Has tried ibuprofen 800 x 1 (total for day) or Alleve 1 tablet twice a day with little relief.  Little temporary relief with heat.  Denies falls.  Does not recall any particular injury but was using a push mower prior to pain starting.  Feels like the knee will "give way."   Review of Systems Per HPI    Objective:   Physical Exam Vitals reviewed.  Constitutional:      General: She is not in acute distress.    Appearance: Normal appearance. She is normal weight. She is not ill-appearing, toxic-appearing or diaphoretic.  HENT:     Head: Normocephalic and atraumatic.  Eyes:     Conjunctiva/sclera: Conjunctivae normal.  Cardiovascular:     Rate and Rhythm: Normal rate.  Pulmonary:     Effort: Pulmonary effort is normal.  Musculoskeletal:     Right knee: Swelling (medial) present. No erythema, ecchymosis, bony tenderness or crepitus. Normal range of motion. Tenderness present over the medial joint line and patellar tendon. Normal alignment and normal patellar mobility.  Skin:    General: Skin is warm and dry.  Neurological:     Mental Status: She is alert and oriented to person, place, and time.       BP 110/70 (BP Location: Left Arm, Patient Position: Sitting, Cuff Size: Normal)   Pulse 80   Temp 97.7 F (36.5 C) (Temporal)   Wt 146 lb (66.2 kg)   SpO2 96%   BMI 24.30 kg/m  Wt Readings from Last 3 Encounters:  10/31/19 146 lb (66.2 kg)  10/21/19 146 lb 8 oz (66.5 kg)  10/02/19 146 lb 8 oz (66.5 kg)       Assessment &  Plan:  1. Pain and swelling of right knee -  Patient Instructions  Good to see you today  Can try ice for 10-15 minutes at a time  Wear brace during day, remove a couple of times and flex/ extend leg to prevent stiffness  Try Alleve 2 tablets every 12 hours x 2 days  I will notify you of xray results  Elevate leg when sitting. Avoid activities that trigger pain. Be mindful of balance/ potential for falls.   If xray does not show any findings that warrant referral to a specialist, consider seeing Dr. Lorelei Pont (sports medicine here in the office). Just call and schedule an appointment  - DG Knee Complete 4 Views Right; Future - traMADol (ULTRAM) 50 MG tablet; Take 1 tablet (50 mg total) by mouth every 12 (twelve) hours as needed for up to 5 days.  Dispense: 10 tablet; Refill: 0  This visit occurred during the SARS-CoV-2 public health emergency.  Safety protocols were in place, including screening questions prior to the visit, additional usage of staff PPE, and extensive cleaning of exam room while observing appropriate contact time as indicated for disinfecting solutions.    Clarene Reamer, FNP-BC  [facility] [place]  10/31/2019 5:16 PM

## 2019-10-31 NOTE — Patient Instructions (Signed)
Good to see you today  Can try ice for 10-15 minutes at a time  Wear brace during day, remove a couple of times and flex/ extend leg to prevent stiffness  Try Alleve 2 tablets every 12 hours x 2 days  I will notify you of xray results  Elevate leg when sitting. Avoid activities that trigger pain. Be mindful of balance/ potential for falls.   If xray does not show any findings that warrant referral to a specialist, consider seeing Dr. Lorelei Pont (sports medicine here in the office). Just call and schedule an appointment

## 2019-11-03 MED ORDER — METOPROLOL SUCCINATE ER 50 MG PO TB24: 50.0000 mg | ORAL_TABLET | Freq: Every day | ORAL | 1 refills | Status: DC

## 2019-11-03 NOTE — Addendum Note (Signed)
Addended by: Carter Kitten on: 11/03/2019 04:08 PM   Modules accepted: Orders

## 2019-11-10 ENCOUNTER — Other Ambulatory Visit: Payer: Self-pay | Admitting: Primary Care

## 2019-11-10 DIAGNOSIS — K219 Gastro-esophageal reflux disease without esophagitis: Secondary | ICD-10-CM

## 2019-11-27 ENCOUNTER — Other Ambulatory Visit: Payer: Self-pay | Admitting: Primary Care

## 2019-11-27 DIAGNOSIS — Z1231 Encounter for screening mammogram for malignant neoplasm of breast: Secondary | ICD-10-CM

## 2019-12-11 ENCOUNTER — Other Ambulatory Visit: Payer: Self-pay | Admitting: Physical Medicine & Rehabilitation

## 2019-12-11 DIAGNOSIS — M5412 Radiculopathy, cervical region: Secondary | ICD-10-CM

## 2019-12-11 DIAGNOSIS — M542 Cervicalgia: Secondary | ICD-10-CM

## 2019-12-27 ENCOUNTER — Other Ambulatory Visit: Payer: Self-pay

## 2019-12-27 ENCOUNTER — Ambulatory Visit
Admission: RE | Admit: 2019-12-27 | Discharge: 2019-12-27 | Disposition: A | Discharge status: Home / Self Care | Payer: Medicare HMO | Source: Ambulatory Visit | Attending: Physical Medicine & Rehabilitation | Admitting: Physical Medicine & Rehabilitation

## 2019-12-27 DIAGNOSIS — M5412 Radiculopathy, cervical region: Secondary | ICD-10-CM | POA: Insufficient documentation

## 2019-12-27 DIAGNOSIS — M542 Cervicalgia: Secondary | ICD-10-CM | POA: Diagnosis present

## 2020-01-09 ENCOUNTER — Ambulatory Visit
Admission: RE | Admit: 2020-01-09 | Discharge: 2020-01-09 | Disposition: A | Discharge status: Home / Self Care | Payer: Medicare HMO | Source: Ambulatory Visit | Attending: Primary Care | Admitting: Primary Care

## 2020-01-09 ENCOUNTER — Other Ambulatory Visit: Payer: Self-pay

## 2020-01-09 DIAGNOSIS — Z1231 Encounter for screening mammogram for malignant neoplasm of breast: Secondary | ICD-10-CM

## 2020-01-28 ENCOUNTER — Encounter: Payer: Self-pay | Admitting: Primary Care

## 2020-01-28 ENCOUNTER — Ambulatory Visit (INDEPENDENT_AMBULATORY_CARE_PROVIDER_SITE_OTHER): Payer: Medicare HMO | Admitting: Primary Care

## 2020-01-28 ENCOUNTER — Other Ambulatory Visit: Payer: Self-pay

## 2020-01-28 VITALS — BP 158/100 | HR 76 | Ht 65.0 in | Wt 148.0 lb

## 2020-01-28 DIAGNOSIS — H9203 Otalgia, bilateral: Secondary | ICD-10-CM | POA: Diagnosis not present

## 2020-01-28 DIAGNOSIS — J309 Allergic rhinitis, unspecified: Secondary | ICD-10-CM

## 2020-01-28 MED ORDER — PREDNISONE 20 MG PO TABS: ORAL_TABLET | ORAL | 0 refills | Status: DC

## 2020-01-28 NOTE — Assessment & Plan Note (Signed)
Acute for the last 10 days, also with other symptoms as noted. She has had the Covid-19 vaccines.  Recommended Covid-19 testing given symptoms of nausea and headaches.   Symptoms could also represent allergies. Rx for prednisone sent to pharmacy. She will update.

## 2020-01-28 NOTE — Progress Notes (Signed)
Subjective:    Patient ID: Valerie [name], female    DOB: [birth year], 69 y.o.   MRN: [id number]  HPI  This visit occurred during the SARS-CoV-2 public health emergency.  Safety protocols were in place, including screening questions prior to the visit, additional usage of staff PPE, and extensive cleaning of exam room while observing appropriate contact time as indicated for disinfecting solutions.   Valerie [name] is a 69 year old female with a history of hypertension, allergic rhinitis, ETD, tinnitus who presents today with a chief complaint of otalgia.  Her pain began over one week ago for which she describes as a pressure and throbbing. She also reports itchy eyes, runny nose, headaches, nausea.   She's checked her BP at home this morning which was 124/70. She is taking Ibuprofen 600 mg once to twice daily due to otalgia and neck pain. She is compliant to her Flonase and Xyzal. She's had her Covid-19 vaccines. She denies known exposure to Covid-19, stays at home most of the time.   BP Readings from Last 3 Encounters:  01/28/20 (!) 158/100  10/31/19 110/70  10/21/19 122/74     Review of Systems  Constitutional: Negative for chills, fatigue and fever.  HENT: Positive for congestion, rhinorrhea and sinus pressure.        Otalgia   Respiratory: Negative for cough.   Gastrointestinal: Positive for nausea.  Musculoskeletal: Negative for myalgias.  Neurological: Positive for headaches.       Past Medical History:  Diagnosis Date  . Acid reflux   . Adenocarcinoma of the endometrium/uterus ([place]) 12/04/2000  . Anal fissure   . Atrophic vaginitis   . Esophagitis   . Hernia    Present to proximal to umbillical   . Hypertension   . Internal hemorrhoids   . Menorrhagia   . Osteopenia   . PONV (postoperative nausea and vomiting)   . Vertigo   . Vitamin D deficiency      Social History   Socioeconomic History  . Marital status: Married    Spouse name: Not on file  . Number of  children: Not on file  . Years of education: Not on file  . Highest education level: Not on file  Occupational History  . Not on file  Tobacco Use  . Smoking status: Never Smoker  . Smokeless tobacco: Never Used  Vaping Use  . Vaping Use: Never used  Substance and Sexual Activity  . Alcohol use: No  . Drug use: Not on file  . Sexual activity: Yes    Birth control/protection: Post-menopausal  Other Topics Concern  . 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   Social Determinants of Health   Financial Resource Strain: Low Risk   . Difficulty of Paying Living Expenses: Not hard at all  Food Insecurity: No Food Insecurity  . Worried About Charity fundraiser in the Last Year: Never true  . Ran Out of Food in the Last Year: Never true  Transportation Needs: No Transportation Needs  . Lack of Transportation (Medical): No  . Lack of Transportation (Non-Medical): No  Physical Activity: Inactive  . Days of Exercise per Week: 0 days  . Minutes of Exercise per Session: 0 min  Stress: No Stress Concern Present  . Feeling of Stress : Not at all  Social Connections:   .  Frequency of Communication with Friends and Family: Not on file  . Frequency of Social Gatherings with Friends and Family: Not on file  . Attends Religious Services: Not on file  . Active Member of Clubs or Organizations: Not on file  . Attends Archivist Meetings: Not on file  . Marital Status: Not on file  Intimate Partner Violence: Not At Risk  . Fear of Current or Ex-Partner: No  . Emotionally Abused: No  . Physically Abused: No  . Sexually Abused: No    Past Surgical History:  Procedure Laterality Date  . [place] STUDY N/A 11/29/2015   Procedure: [place] STUDY;  Surgeon: Manus Gunning, MD;  Location: [facility];  Service: Gastroenterology;  Laterality: N/A;  .  ABDOMINAL HYSTERECTOMY  12/04/2000   also bilateral salpingo-oophorectomy  . CARDIAC CATHETERIZATION  2006  . CARPAL TUNNEL RELEASE  11/20/2011   Procedure: CARPAL TUNNEL RELEASE;  Surgeon: Elaina Hoops, MD;  Location: [place] [facility];  Service: Neurosurgery;  Laterality: Left;  LEFT carpal tunnel release  . CERVICAL SPINE SURGERY  2013   [place]  . DILATION AND CURETTAGE OF UTERUS     11/15/2000  . ESOPHAGEAL MANOMETRY N/A 09/20/2015   Procedure: ESOPHAGEAL MANOMETRY (EM);  Surgeon: Manus Gunning, MD;  Location: [facility];  Service: Gastroenterology;  Laterality: N/A;  . HYSTEROSCOPY  11/15/2000   D & C, resection of endometrial polyps  . OOPHORECTOMY  12/04/2000   bilateral salpingo-oophorectomy done with TAH  . TUBAL LIGATION      Family History  Problem Relation Age of Onset  . Hypertension Father   . Prostate cancer Father   . Hypertension Sister   . Diabetes Brother   . Heart disease Brother   . Hypertension Brother   . Diabetes Brother   . Heart disease Brother   . Breast cancer Neg Hx     No Known Allergies  Current Outpatient Medications on File Prior to Visit  Medication Sig Dispense Refill  . amitriptyline (ELAVIL) 25 MG tablet Take 1 tablet (25 mg total) by mouth at bedtime. For pain. 30 tablet 0  . aspirin EC 81 MG tablet Take 81 mg by mouth daily.    Marland Kitchen atorvastatin (LIPITOR) 10 MG tablet TAKE 1 TABLET ONCE DAILY FOR CHOLESTEROL. 90 tablet 2  . Calcium Carbonate-Vit D-Min (CALTRATE 600+D PLUS PO) Take 1 tablet by mouth 2 (two) times daily.      . fluticasone (FLONASE) 50 MCG/ACT nasal spray Place 2 sprays into both nostrils daily as needed for allergies. 16 g 2  . levocetirizine (XYZAL) 5 MG tablet Take 1 tablet by mouth every evening for allergies. 90 tablet 0  . losartan (COZAAR) 100 MG tablet TAKE 1 TABLET ONCE DAILY FOR BLOOD PRESSURE. 90 tablet 2  . metoprolol succinate (TOPROL-XL) 50 MG 24 hr tablet Take 1 tablet (50 mg total)  by mouth daily. Take with or immediately following a meal. 90 tablet 1  . nitroGLYCERIN (NITROGLYN) 2 % ointment Apply 1 inch topically 4 (four) times daily as needed for chest pain.    Marland Kitchen omeprazole (PRILOSEC) 40 MG capsule TAKE 1 CAPSULE (40 MG TOTAL) BY MOUTH DAILY. FOR HEARTBURN. 90 capsule 3  . vitamin B-12 (CYANOCOBALAMIN) 1000 MCG tablet Take 1 tablet (1,000 mcg total) by mouth daily.     No current facility-administered medications on file prior to visit.    BP (!) 158/100   Pulse 76  Ht 5\' 5"  (1.651 m)   Wt 148 lb (67.1 kg)   SpO2 97%   BMI 24.63 kg/m    Objective:   Physical Exam HENT:     Right Ear: Tympanic membrane and ear canal normal.     Left Ear: Tympanic membrane and ear canal normal.  Cardiovascular:     Rate and Rhythm: Normal rate and regular rhythm.  Pulmonary:     Effort: Pulmonary effort is normal.     Breath sounds: Normal breath sounds.  Musculoskeletal:     Cervical back: Neck supple.  Lymphadenopathy:     Cervical: No cervical adenopathy.  Neurological:     Mental Status: She is alert.            Assessment & Plan:

## 2020-01-28 NOTE — Patient Instructions (Signed)
I recommend you go for Covid-19 testing.  Start prednisone for ear pressure/allergies. Take 2 tablets daily for three days, then 1 tablet daily for three days.  It was a pleasure to see you today!

## 2020-02-03 ENCOUNTER — Other Ambulatory Visit: Payer: Self-pay

## 2020-02-03 ENCOUNTER — Encounter: Payer: Self-pay | Admitting: Emergency Medicine

## 2020-02-03 ENCOUNTER — Emergency Department: Payer: Medicare HMO

## 2020-02-03 ENCOUNTER — Emergency Department
Admission: EM | Admit: 2020-02-03 | Discharge: 2020-02-03 | Disposition: A | Discharge status: Home / Self Care | Payer: Medicare HMO | Attending: Emergency Medicine | Admitting: Emergency Medicine

## 2020-02-03 DIAGNOSIS — I160 Hypertensive urgency: Secondary | ICD-10-CM | POA: Insufficient documentation

## 2020-02-03 DIAGNOSIS — Z7982 Long term (current) use of aspirin: Secondary | ICD-10-CM | POA: Insufficient documentation

## 2020-02-03 DIAGNOSIS — R519 Headache, unspecified: Secondary | ICD-10-CM | POA: Diagnosis not present

## 2020-02-03 DIAGNOSIS — Z79899 Other long term (current) drug therapy: Secondary | ICD-10-CM | POA: Diagnosis not present

## 2020-02-03 DIAGNOSIS — I10 Essential (primary) hypertension: Secondary | ICD-10-CM | POA: Diagnosis not present

## 2020-02-03 LAB — BASIC METABOLIC PANEL
Anion gap: 8 (ref 5–15)
BUN: 18 mg/dL (ref 8–23)
CO2: 29 mmol/L (ref 22–32)
Calcium: 9.4 mg/dL (ref 8.9–10.3)
Chloride: 104 mmol/L (ref 98–111)
Creatinine, Ser: 0.91 mg/dL (ref 0.44–1.00)
GFR calc Af Amer: >60 mL/min (ref >60)
GFR calc non Af Amer: >60 mL/min (ref >60)
Glucose, Bld: 97 mg/dL (ref 70–99)
Potassium: 3.7 mmol/L (ref 3.5–5.1)
Sodium: 141 mmol/L (ref 135–145)

## 2020-02-03 LAB — CBC
HCT: 40.9 % (ref 36.0–46.0)
Hemoglobin: 13.9 g/dL (ref 12.0–15.0)
MCH: 32.5 pg (ref 26.0–34.0)
MCHC: 34 g/dL (ref 30.0–36.0)
MCV: 95.6 fL (ref 80.0–100.0)
Platelets: 210 10*3/uL (ref 150–400)
RBC: 4.28 MIL/uL (ref 3.87–5.11)
RDW: 13.4 % (ref 11.5–15.5)
WBC: 7.4 10*3/uL (ref 4.0–10.5)
nRBC: 0 % (ref 0.0–0.2)

## 2020-02-03 MED ORDER — AMLODIPINE BESYLATE 5 MG PO TABS: 5.0000 mg | ORAL_TABLET | Freq: Every day | ORAL | 0 refills | Status: DC

## 2020-02-03 MED ORDER — AMLODIPINE BESYLATE 5 MG PO TABS
10.0000 mg | ORAL_TABLET | Freq: Once | ORAL | Status: AC
  Administered 2020-02-03: 10 mg via ORAL
  Filled 2020-02-03: qty 2

## 2020-02-03 NOTE — ED Notes (Signed)
Pt verbalized discharge instructions and has no questions at this time 

## 2020-02-03 NOTE — ED Triage Notes (Signed)
Patient presents to the ED with high blood pressure, headache, dizziness and weakness.  Patient states when she stands, her blood pressure is more normal but when she sits, her blood pressure is in the 190s/100s.

## 2020-02-03 NOTE — ED Provider Notes (Signed)
[facility] Emergency Department Provider Note ____________________________________________   First MD Initiated Contact with Patient 02/03/20 1045     (approximate)  I have reviewed the triage vital signs and the nursing notes.  HISTORY  Chief Complaint Hypertension  HPI Valerie [name] is a 69 y.o. female history of hypertension vertigo  Patient presents today, reports about 1 week now she has been experiencing a mild headache.  She reports it feels like her blood pressure is elevated she has been checking her blood pressure at home its been running about 190/105.  She saw her primary care for the same just a few days ago, did not have a medication change then.  They recommended she get a Covid test which she went dead and this was negative  She denies any fevers or chills.  She does report a mild headache.  It was slow in onset.  Feels throbbing.  Does not wish for any pain medication reports very mild.  She is compliant with her medications and takes them in the morning for her blood pressure as she did today  No chest pain.  No shortness of breath.  No leg swelling.  No numbness tingling or weakness.   Past Medical History:  Diagnosis Date  . Acid reflux   . Adenocarcinoma of the endometrium/uterus ([place]) 12/04/2000  . Anal fissure   . Atrophic vaginitis   . Esophagitis   . Hernia    Present to proximal to umbillical   . Hypertension   . Internal hemorrhoids   . Menorrhagia   . Osteopenia   . PONV (postoperative nausea and vomiting)   . Vertigo   . Vitamin D deficiency     Patient Active Problem List   Diagnosis Date Noted  . Otalgia of both ears 01/28/2020  . Tinnitus of both ears 10/02/2019  . ETD (Eustachian tube dysfunction), right 04/29/2019  . Weakness of both lower extremities 04/03/2019  . Chronic back pain 04/03/2019  . Preventative health care 02/14/2019  . Vulvar dermatitis 01/01/2018  . Allergic rhinitis 08/22/2017  . Neck pain  with history of cervical spinal surgery 01/22/2017  . Prediabetes 11/16/2016  . Hyperlipidemia 11/16/2015  . Esophageal reflux   . Hemorrhoid 06/30/2015  . Atypical chest pain 01/04/2015  . Palpitations 01/04/2015  . Welcome to Medicare preventive visit 10/09/2014  . Benign paroxysmal positional vertigo 08/31/2014  . Hernia   . Menorrhagia   . Osteopenia   . Vitamin D deficiency   . Adenocarcinoma of the endometrium/uterus ([place])   . Generalized anxiety disorder 01/23/2007  . Essential hypertension 01/23/2007    Past Surgical History:  Procedure Laterality Date  . [place] STUDY N/A 11/29/2015   Procedure: [place] STUDY;  Surgeon: Manus Gunning, MD;  Location: [facility];  Service: Gastroenterology;  Laterality: N/A;  . ABDOMINAL HYSTERECTOMY  12/04/2000   also bilateral salpingo-oophorectomy  . CARDIAC CATHETERIZATION  2006  . CARPAL TUNNEL RELEASE  11/20/2011   Procedure: CARPAL TUNNEL RELEASE;  Surgeon: Elaina Hoops, MD;  Location: [place] [facility];  Service: Neurosurgery;  Laterality: Left;  LEFT carpal tunnel release  . CERVICAL SPINE SURGERY  2013   [place]  . DILATION AND CURETTAGE OF UTERUS     11/15/2000  . ESOPHAGEAL MANOMETRY N/A 09/20/2015   Procedure: ESOPHAGEAL MANOMETRY (EM);  Surgeon: Manus Gunning, MD;  Location[facility];  Service: Gastroenterology;  Laterality: N/A;  . HYSTEROSCOPY  11/15/2000  D & C, resection of endometrial polyps  . OOPHORECTOMY  12/04/2000   bilateral salpingo-oophorectomy done with TAH  . TUBAL LIGATION      Prior to Admission medications   Medication Sig Start Date End Date Taking? Authorizing Provider  amitriptyline (ELAVIL) 25 MG tablet Take 1 tablet (25 mg total) by mouth at bedtime. For pain. 06/04/19   Pleas Koch, NP  amLODipine (NORVASC) 5 MG tablet Take 1 tablet (5 mg total) by mouth daily. 02/04/20 02/03/21  Delman Kitten, MD  aspirin EC 81 MG tablet Take 81 mg by mouth daily.     [provider]  atorvastatin (LIPITOR) 10 MG tablet TAKE 1 TABLET ONCE DAILY FOR CHOLESTEROL. 10/30/19   Pleas Koch, NP  Calcium Carbonate-Vit D-Min (CALTRATE 600+D PLUS PO) Take 1 tablet by mouth 2 (two) times daily.      [provider]  fluticasone (FLONASE) 50 MCG/ACT nasal spray Place 2 sprays into both nostrils daily as needed for allergies. 06/02/16   Jearld Fenton, NP  levocetirizine (XYZAL) 5 MG tablet Take 1 tablet by mouth every evening for allergies. 02/11/18   Pleas Koch, NP  losartan (COZAAR) 100 MG tablet TAKE 1 TABLET ONCE DAILY FOR BLOOD PRESSURE. 10/30/19   Pleas Koch, NP  metoprolol succinate (TOPROL-XL) 50 MG 24 hr tablet Take 1 tablet (50 mg total) by mouth daily. Take with or immediately following a meal. 11/03/19   Pleas Koch, NP  nitroGLYCERIN (NITROGLYN) 2 % ointment Apply 1 inch topically 4 (four) times daily as needed for chest pain.    [provider]  omeprazole (PRILOSEC) 40 MG capsule TAKE 1 CAPSULE (40 MG TOTAL) BY MOUTH DAILY. FOR HEARTBURN. 11/12/19   Pleas Koch, NP  predniSONE (DELTASONE) 20 MG tablet Take 2 tablets by mouth once daily for three days, then 1 tablet once daily for three days. 01/28/20   Pleas Koch, NP  vitamin B-12 (CYANOCOBALAMIN) 1000 MCG tablet Take 1 tablet (1,000 mcg total) by mouth daily. 05/27/19   Tonia Ghent, MD    Allergies Patient has no known allergies.  Family History  Problem Relation Age of Onset  . Hypertension Father   . Prostate cancer Father   . Hypertension Sister   . Diabetes Brother   . Heart disease Brother   . Hypertension Brother   . Diabetes Brother   . Heart disease Brother   . Breast cancer Neg Hx     Social History Social History   Tobacco Use  . Smoking status: Never Smoker  . Smokeless tobacco: Never Used  Vaping Use  . Vaping Use: Never used  Substance Use Topics  . Alcohol use: No  . Drug use: Not on file    Review of  Systems Constitutional: No fever/chills Eyes: No visual changes. ENT: No sore throat.   Cardiovascular: Denies chest pain. Respiratory: Denies shortness of breath. Gastrointestinal: No abdominal pain.   Genitourinary: Negative for dysuria. Musculoskeletal: Negative for back pain. Skin: Negative for rash. Neurological: Negative for areas of focal weakness or numbness.    ____________________________________________   PHYSICAL EXAM:  VITAL SIGNS: ED Triage Vitals  Enc Vitals Group     BP 02/03/20 0954 (!) 173/90     Pulse Rate 02/03/20 0954 74     Resp 02/03/20 0954 16     Temp 02/03/20 0954 98.3 F (36.8 C)     Temp Source 02/03/20 0954 Oral     SpO2 02/03/20 0954  96 %     Weight 02/03/20 0954 143 lb (64.9 kg)     Height 02/03/20 0954 5\' 5"  (1.651 m)     Head Circumference --      Peak Flow --      Pain Score 02/03/20 0952 6     Pain Loc --      Pain Edu? --      Excl. in [place]? --     Constitutional: Alert and oriented. Well appearing and in no acute distress. Eyes: Conjunctivae are normal. Head: Atraumatic. Nose: No congestion/rhinnorhea. Mouth/Throat: Mucous membranes are moist. Neck: No stridor.  Cardiovascular: Normal rate, regular rhythm. Grossly normal heart sounds.  Good peripheral circulation. Respiratory: Normal respiratory effort.  No retractions. Lungs CTAB. Gastrointestinal: Soft and nontender. No distention. Musculoskeletal: No lower extremity tenderness nor edema. Neurologic:  Normal speech and language. No gross focal neurologic deficits are appreciated.  Normal cranial nerve exam.  No pronator drift.  Full use of extremities of difficulty Skin:  Skin is warm, dry and intact. No rash noted. Psychiatric: Mood and affect are normal. Speech and behavior are normal.  ____________________________________________   LABS (all labs ordered are listed, but only abnormal results are displayed)  Labs Reviewed  BASIC METABOLIC PANEL  CBC  URINALYSIS,  COMPLETE (UACMP) WITH MICROSCOPIC   ____________________________________________  EKG  ED ECG REPORT I, Delman Kitten, the attending physician, personally viewed and interpreted this ECG.  Date: 02/03/2020 EKG Time: 10 AM Rate: 70 Rhythm: normal sinus rhythm QRS Axis: normal Intervals: normal ST/T Wave abnormalities: normal Narrative Interpretation: no evidence of acute ischemia  ____________________________________________  RADIOLOGY  CT Head Wo Contrast  Result Date: 02/03/2020 CLINICAL DATA:  Headache, tension type. Additional provided: High blood pressure, headache, dizziness and weakness. EXAM: CT HEAD WITHOUT CONTRAST TECHNIQUE: Contiguous axial images were obtained from the base of the skull through the vertex without intravenous contrast. COMPARISON:  Head CT 08/13/2007. FINDINGS: Brain: Cerebral volume is normal for age. Mild ill-defined hypoattenuation within the cerebral white matter is nonspecific, but consistent with chronic small vessel ischemic disease. There is no acute intracranial hemorrhage. No demarcated cortical infarct. No extra-axial fluid collection. No evidence of intracranial mass. No midline shift. Partially empty sella turcica. Vascular: No hyperdense vessel. Skull: Normal. Negative for fracture or focal lesion. Sinuses/Orbits: Visualized orbits show no acute finding. No significant paranasal sinus disease or mastoid effusion at the imaged levels. IMPRESSION: No evidence of acute intracranial abnormality. Mild cerebral white matter chronic small vessel ischemic disease. Electronically Signed   By: Kellie Simmering DO   On: 02/03/2020 12:07    No acute finding CT head ____________________________________________   PROCEDURES  Procedure(s) performed: None  Procedures  Critical Care performed: No  ____________________________________________   INITIAL IMPRESSION / ASSESSMENT AND PLAN / ED COURSE  Pertinent labs & imaging results that were available during  my care of the patient were reviewed by me and considered in my medical decision making (see chart for details).   Patient transfer concerns of hypertension, notably both systolic and diastolic hypertension.  Reports mild throbbing-like headache associated.  No hard focal findings.  No abnormalities noted on physical examination, reassuring clinical exam.  History of hypertension, discussed with cardiology Dr. Nehemiah Massed, advises adding amlodipine.  Patient's imaging studies reassuring.  Patient feels improved, no signs or symptoms of ACS or chest pain.  No dyspnea.  ----------------------------------------- 1:12 PM on 02/03/2020 -----------------------------------------  Patient feels much improved.  Blood pressure 768 systolic.  Headache has gone away and  she feels completely asymptomatic.  Will prescribe amlodipine and she has plan to follow-up Friday with Dr. Alveria Apley office.  Also reached out to her PCP (K Clark via [facility]) to advise of the visit today.    Return precautions and treatment recommendations and follow-up discussed with the patient who is agreeable with the plan.  Patient discharged with plan to follow-up with neurosurgery Dr. Lacinda Axon at his office shortly today  ____________________________________________   FINAL CLINICAL IMPRESSION(S) / ED DIAGNOSES  Final diagnoses:  Hypertensive urgency        Note:  This document was prepared using Dragon voice recognition software and may include unintentional dictation errors       Delman Kitten, MD 02/03/20 1313

## 2020-02-12 ENCOUNTER — Encounter: Payer: Self-pay | Admitting: Psychology

## 2020-02-19 ENCOUNTER — Encounter: Payer: Medicare HMO | Attending: Psychology | Admitting: Psychology

## 2020-02-19 ENCOUNTER — Other Ambulatory Visit: Payer: Self-pay

## 2020-02-19 DIAGNOSIS — G894 Chronic pain syndrome: Secondary | ICD-10-CM | POA: Insufficient documentation

## 2020-02-19 DIAGNOSIS — F411 Generalized anxiety disorder: Secondary | ICD-10-CM | POA: Insufficient documentation

## 2020-02-19 DIAGNOSIS — Z01818 Encounter for other preprocedural examination: Secondary | ICD-10-CM | POA: Diagnosis not present

## 2020-02-26 ENCOUNTER — Encounter (HOSPITAL_BASED_OUTPATIENT_CLINIC_OR_DEPARTMENT_OTHER): Payer: Medicare HMO | Admitting: Psychology

## 2020-02-26 ENCOUNTER — Other Ambulatory Visit: Payer: Self-pay

## 2020-02-26 ENCOUNTER — Encounter: Payer: Self-pay | Admitting: Psychology

## 2020-02-26 DIAGNOSIS — F411 Generalized anxiety disorder: Secondary | ICD-10-CM | POA: Diagnosis not present

## 2020-02-26 DIAGNOSIS — G894 Chronic pain syndrome: Secondary | ICD-10-CM

## 2020-02-26 NOTE — Progress Notes (Signed)
Neuropsychological Consultation   Patient:   Valerie [name]   DOB:   Nov 18, 1950  MR Number:  [id number]  Location:  [facility] [place], [place] [id number] [place] [address] Dept: [phone]           Date of Service:   02/19/2020  Start Time:   4 PM End Time:   5 PM  Provider/Observer:  Ilean Skill, Psy.D.       Clinical Neuropsychologist       Billing Code/Service: Psychological diagnostic clinical interview  Chief Complaint:    Valerie [name]. Valerie [name]) is a 69 year old female who is referred by Lysle Morales, MD for psychological evaluation as part of the prescribed work-up for consideration of spinal cord stimulator trial and and possible implantation.  The patient has been having ongoing neck pain and chronic pain symptoms and is having consideration of spinal cord stimulator application.  The patient reports that she had neck surgery 6 or 7 years ago and is still having issues with pain, which is becoming more severe over time.  The patient reports that she has tried all kinds of options to improve her pain but none has been particularly helpful.  Reason for Service:  Valerie [name]. Valerie [name]) is a 69 year old female who is referred by Lysle Morales, MD for psychological evaluation as part of the prescribed work-up for consideration of spinal cord stimulator trial and and possible implantation.  The patient has been having ongoing neck pain and chronic pain symptoms and is having consideration of spinal cord stimulator application.  The patient reports that she had neck surgery 6 or 7 years ago and is still having issues with pain, which is becoming more severe over time.  The patient reports that she has tried all kinds of options to improve her pain but none has been particularly helpful.  The patient reports that she has no current or past issues with depression and currently  has no major psychosocial stressors going on in her life beyond difficulties associated with her pain.  She does acknowledge times of feeling "down" but not being able to engage in life the way she would like to and has times where she feels pain to the point that she goes to bed to cope and manage with it.  The patient does have a past history of some degree of anxiety but denies any worsening or significant incapacity caused by this anxiety.  The patient reports that her pain has been progressively worsening over the past year.  The patient has only other hospitalizations or surgeries beyond her neck surgery 6 or 7 years ago had to do with the birth of her children in 83 in 47.  The patient also had gallbladder surgery as well as a hysterectomy 20 years ago or more.  The patient had neck surgery sometime a little bit less than 10 years ago and has dealt with carpal tunnel surgical interventions 10 years ago.  She also had knee surgery approximately 2017 as well as back surgery in the past.  The patient reports that she does have difficulty sleeping due to incessant pain in her neck.  She reports that she will get up 2 or 3 times at night because of her pain.  The patient denies any cognitive difficulties and reports that her overall mental status, cognition and memory all are remaining quite stable and within normal limits.  The patient has  had a recent nerve conduction study which revealed a mild carpal tunnel pathology as well as chronic C6 radiculopathy.  She does also have indications of mild numbness in her left fingertips.  Her pain continues to be her greatest concern particularly with her left arm pain.  Past medical history includes GERD, hyperlipidemia and hypertension.  Reliability of Information: Information is provided through direct clinical interview with the patient as well as review of available medical records.  Behavioral Observation: Valerie [name]  presents as a 69 y.o.-year-old  Right Caucasian Female who appeared her stated age. her dress was Appropriate and she was Well Groomed and her manners were Appropriate to the situation.  her participation was indicative of Appropriate and Attentive behaviors.  There were not any physical disabilities noted.  she displayed an appropriate level of cooperation and motivation.     Interactions:    Active Appropriate  Attention:   abnormal and attention span and concentration were age appropriate  Memory:   within normal limits; recent and remote memory intact  Visuo-spatial:  not examined  Speech (Volume):  normal  Speech:   normal; normal  Thought Process:  Coherent and Relevant  Though Content:  WNL; not suicidal and not homicidal  Orientation:   person, place, time/date and situation  Judgment:   Good  Planning:   Good  Affect:    Appropriate  Mood:    Euthymic  Insight:   Good  Intelligence:   normal  Marital Status/Living: The patient was born and raised in [facility] and was born at [facility].  She has a number of siblings.  The patient's mother's pregnancy with the patient was of normal duration and was uncomplicated and the patient weighed 7 pounds 6 ounces at birth.  The only significant childhood illness noted was measles.  The patient currently lives with her husband of 22 years.  The patient was also married previously and this marriage lasted 7 years.  She has 2 children age 44 and 36 respectively.  Current Employment: The patient is retired.  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  Hobbies and interests have included primarily spending time with family.  Substance Use:  No concerns of substance abuse are reported.  The patient denies any substance abuse history.  Education:   The patient completed the 10th grade attending [employer] with her best subject being math and some difficulties in [place].  She is  unsure of her GPA.  Medical History:   Past Medical History:  Diagnosis Date  . Acid reflux   . Adenocarcinoma of the endometrium/uterus ([place]) 12/04/2000  . Anal fissure   . Atrophic vaginitis   . Esophagitis   . Hernia    Present to proximal to umbillical   . Hypertension   . Internal hemorrhoids   . Menorrhagia   . Osteopenia   . PONV (postoperative nausea and vomiting)   . Vertigo   . Vitamin D deficiency          Abuse/Trauma History: The patient denies any history of traumatic or abusive symptoms.  Psychiatric History:  The patient has past medical history to include generalized anxiety disorder but denies it being severe at this time.  Family Med/Psych History:  Family History  Problem Relation Age of Onset  . Hypertension Father   . Prostate cancer Father   . Hypertension Sister   . Diabetes Brother   .  Heart disease Brother   . Hypertension Brother   . Diabetes Brother   . Heart disease Brother   . Breast cancer Neg Hx     Impression/DX:  Valerie [name]. Valerie [name]) is a 69 year old female who is referred by Lysle Morales, MD for psychological evaluation as part of the prescribed work-up for consideration of spinal cord stimulator trial and and possible implantation.  The patient has been having ongoing neck pain and chronic pain symptoms and is having consideration of spinal cord stimulator application.  The patient reports that she had neck surgery 6 or 7 years ago and is still having issues with pain, which is becoming more severe over time.  The patient reports that she has tried all kinds of options to improve her pain but none has been particularly helpful.  The patient denies any significant psychosocial stressors currently and denies any significant mood disturbance related to depression or anxiety or other psychotic type symptoms.  Disposition/Plan:  Beyond the current clinical interview the patient will also complete objective psychological testing including the  [place] multiphasic personality-2 as well as the pain patient profile (P3).  Once these 2 objective psychological measures are completed a formal report with summary and recommendations will be provided to her primary care physician.  Y will not set up a formal feedback session with the patient regarding the results of this evaluation I will make sure that it is available to the patient and her my chart and if she has any questions or would like to sit down and have a formal face-to-face review of these results she has been instructed that that will be completely appropriate and available to her if she chooses.  Diagnosis:    Chronic pain syndrome  Generalized anxiety disorder         Electronically Signed   _______________________ Ilean Skill, Psy.D.

## 2020-02-26 NOTE — Progress Notes (Signed)
Patient:  Valerie [name]   DOB: 01/23/1951  MR Number: [id number]  Location: [facility] [place], [place] [id number] [place] [id number] Dept: [phone]  Start: 4 PM End: 5 PM  Today's visit was conducted in my outpatient clinic office.  The patient was not present.  This 1 hour visit consisted of scoring and interpretation of her objective neuropsychological/psychological test measures as well as formal report writing including conceptualization, diagnostic and interpretive decision making and report.  Provider/Observer:     Edgardo Roys PsyD  Chief Complaint:      Chief Complaint  Patient presents with  . Pain  . Arm Pain  . Neck Pain    Reason For Service:      Trish Mancinelli. Journe Hallmark) is a 69 year old female who is referred by Lysle Morales, MD for psychological evaluation as part of the prescribed work-up for consideration of spinal cord stimulator trial and and possible implantation.  The patient has been having ongoing neck pain and chronic pain symptoms and is having consideration of spinal cord stimulator application.  The patient reports that she had neck surgery 6 or 7 years ago and is still having issues with pain, which is becoming more severe over time.  The patient reports that she has tried all kinds of options to improve her pain but none has been particularly helpful.  The patient reports that she has no current or past issues with depression and currently has no major psychosocial stressors going on in her life beyond difficulties associated with her pain.  She does acknowledge times of feeling "down" but not being able to engage in life the way she would like to and has times where she feels pain to the point that she goes to bed to cope and manage with it.  The patient does have a past history of some degree of anxiety but denies any worsening or significant  incapacity caused by this anxiety.  The patient reports that her pain has been progressively worsening over the past year.  The patient has only other hospitalizations or surgeries beyond her neck surgery 6 or 7 years ago had to do with the birth of her children in 54 in 76.  The patient also had gallbladder surgery as well as a hysterectomy 20 years ago or more.  The patient had neck surgery sometime a little bit less than 10 years ago and has dealt with carpal tunnel surgical interventions 10 years ago.  She also had knee surgery approximately 2017 as well as back surgery in the past.  The patient reports that she does have difficulty sleeping due to incessant pain in her neck.  She reports that she will get up 2 or 3 times at night because of her pain.  The patient denies any cognitive difficulties and reports that her overall mental status, cognition and memory all are remaining quite stable and within normal limits.  The patient has had a recent nerve conduction study which revealed a mild carpal tunnel pathology as well as chronic C6 radiculopathy.  She does also have indications of mild numbness in her left fingertips.  Her pain continues to be her greatest concern particularly with her left arm pain.  Past medical history includes GERD, hyperlipidemia and hypertension.  Testing Administered:  The patient was administered and completed the [place] multiphasic personality inventory-2 as well as the pain patient profile (P3).  Participation Level:   Active  Participation Quality:  Appropriate and Attentive      Behavioral Observation:  Well Groomed, Alert, and Appropriate.   Test Results:   Initially, the patient completed the MMPI-2.  As far as validity assessment, the patient appeared to approach this measure in an honest and straightforward manner neither attempted to exaggerate or minimize current symptomatology.  The resulting clinical profile does appear to be valid allowing for  valid interpretation of resulting clinical scales.  The patient did have some mild elevation in clinical features related to anxiety but these were mildly above clinically significant threshold and did not appear to be severe in nature.  Globally, the patient did not have a significant elevation in overall items consistent or predictive of significant depressive symptomatology.  The patient had some elevation in items related to specific physical/medical concerns but there was no significant element patient in generally vague nondiffuse somatic concerns.  There were no indications of any manic features or psychotic features in this profile and the patient does not appear to have any deep-seated unresolved anger issues or other significant adjustment issues.  Further analysis utilizing content and supplemental scales indicate that the mild elevation in anxiety was not overly specific in nature and the patient does not have significant intrusive rumination or other features.  The patient also did not have elevations in specific scales associated with chronic or acute PTSD type symptoms.  The patient also had no significant elevation in items that predict vulnerability to alcohol or substance abuse.  The individual or specific items that have been shown to be associated with depressive symptomatology were generally around specific somatic concerns.  The patient also completed the pain patient profile.  This item is included as the MMPI-2 was not specifically normed on chronic pain patients such as the current patient.  These measures allow for adjustments to typical items that can be elevated in chronic pain patients that may not be indicative of significant psychological or psychiatric issues.  Overall, the patient produced a valid profile neither attempting to exaggerate or minimize her current symptomatology.  While the patient did have some very mild elevations relative to a normative population without  chronic pain features related to somatic complaints, anxiety or depressive symptomatology none of these 3 primary measures were elevated relative to a chronic pain patient population.  There were no significant elevations in anxiety, depression or somatic concerns relative to a pain patient population and they were only mildly elevated relative to a community-based normative sample.  Summary of Results:   The results of the objective psychological measures with this patient showed no either specific or indirect indications of significant psychiatric or psychological illness or distress.  There were no indications of significant depression, anxiety, psychosis, manic or other significant psychiatric illness.  The patient did not show any elevation or indication of significant maladjustment's, chronic or acute PTSD symptoms or vulnerability to significant substance abuse issues.  The patient's cognitive functioning appears to be well within normal limits with no indications of impaired memory, executive functioning, visual-spatial abilities or judgment abilities.  Impression/Diagnosis:   Overall, the results of the current objective psychological evaluation do suggest that the patient is an excellent candidate from a psychological/psychiatric perspective for spinal cord trialing and possible complete implantation.  There do not appear to be any significant psychiatric or psychological variables that will impair the patient's ability to provide accurate and valid information about the effectiveness of the spinal cord stimulator during the trialing  phase or during postsurgical events after complete implantation.  The patient's cognitive/neuropsychological function appear to be quite good with good memory, executive function abilities, visual-spatial abilities, attention and concentration abilities and global intellectual abilities.  The patient does have history of some anxiety symptoms but even though symptoms do  not appear to be severe or significant and they are certainly not currently playing a significant role in either her perception of her chronic pain symptoms or causing disruption enough to limit her effectiveness and understanding interpreting pain levels during the trialing phase.  Diagnosis:     Chronic pain syndrome  Generalized anxiety disorder   Ilean Skill, Psy.D. Neuropsychologist

## 2020-03-10 ENCOUNTER — Other Ambulatory Visit: Payer: Self-pay | Admitting: Neurosurgery

## 2020-03-11 ENCOUNTER — Ambulatory Visit: Payer: Medicare HMO | Admitting: Psychology

## 2020-03-15 ENCOUNTER — Other Ambulatory Visit: Payer: Self-pay | Admitting: Primary Care

## 2020-03-15 DIAGNOSIS — I10 Essential (primary) hypertension: Secondary | ICD-10-CM

## 2020-03-18 ENCOUNTER — Encounter
Admission: RE | Admit: 2020-03-18 | Discharge: 2020-03-18 | Disposition: A | Discharge status: Home / Self Care | Payer: Medicare HMO | Source: Ambulatory Visit | Attending: Neurosurgery | Admitting: Neurosurgery

## 2020-03-18 ENCOUNTER — Other Ambulatory Visit: Payer: Self-pay

## 2020-03-18 DIAGNOSIS — Z01812 Encounter for preprocedural laboratory examination: Secondary | ICD-10-CM | POA: Diagnosis present

## 2020-03-18 DIAGNOSIS — Z20822 Contact with and (suspected) exposure to covid-19: Secondary | ICD-10-CM | POA: Diagnosis not present

## 2020-03-18 HISTORY — DX: Failed or difficult intubation, initial encounter: T88.4XXA

## 2020-03-18 LAB — URINALYSIS, ROUTINE W REFLEX MICROSCOPIC
Bacteria, UA: NONE SEEN
Bilirubin Urine: NEGATIVE
Glucose, UA: NEGATIVE mg/dL
Ketones, ur: NEGATIVE mg/dL
Nitrite: NEGATIVE
Protein, ur: NEGATIVE mg/dL
Specific Gravity, Urine: 1.017 (ref 1.005–1.030)
pH: 5 (ref 5.0–8.0)

## 2020-03-18 LAB — TYPE AND SCREEN
ABO/RH(D): A POS
Antibody Screen: NEGATIVE

## 2020-03-18 LAB — PROTIME-INR
INR: 1 (ref 0.8–1.2)
Prothrombin Time: 12.9 seconds (ref 11.4–15.2)

## 2020-03-18 LAB — APTT: aPTT: 28 seconds (ref 24–36)

## 2020-03-18 LAB — SURGICAL PCR SCREEN
MRSA, PCR: NEGATIVE
Staphylococcus aureus: NEGATIVE

## 2020-03-18 NOTE — Patient Instructions (Signed)
Your procedure is scheduled on: Mon 10/25 Report to Day Surgery. To find out your arrival time please call [phone] between 1PM - 3PM on Friday 10/22.  Remember: Instructions that are not followed completely may result in serious medical risk,  up to and including death, or upon the discretion of your surgeon and anesthesiologist your  surgery may need to be rescheduled.     _X__ 1. Do not eat food after midnight the night before your procedure.                 No chewing gum or hard candies. You may drink clear liquids up to 2 hours                 before you are scheduled to arrive for your surgery- DO not drink clear                 liquids within 2 hours of the start of your surgery.                 Clear Liquids include:  water, apple juice without pulp, clear Gatorade, G2 or                  Gatorade Zero (avoid Red/Purple/Blue), Black Coffee or Tea (Do not add                 anything to coffee or tea). _____2.   Complete the "Ensure Clear Pre-surgery Clear Carbohydrate Drink" provided to you, 2 hours before arrival. **If you       are diabetic you will be provided with an alternative drink, Gatorade Zero or G2.  __X__2.  On the morning of surgery brush your teeth with toothpaste and water, you                may rinse your mouth with mouthwash if you wish.  Do not swallow any toothpaste of mouthwash.     ___ 3.  No Alcohol for 24 hours before or after surgery.   ___ 4.  Do Not Smoke or use e-cigarettes For 24 Hours Prior to Your Surgery.                 Do not use any chewable tobacco products for at least 6 hours prior to                 Surgery.  ___  5.  Do not use any recreational drugs (marijuana, cocaine, heroin, ecstasy, MDMA or other)                For at least one week prior to your surgery.  Combination of these drugs with anesthesia                May have life threatening results.  ____  6.  Bring all medications with you on the day of  surgery if instructed.   __x__  7.  Notify your doctor if there is any change in your medical condition      (cold, fever, infections).     Do not wear jewelry, make-up, hairpins, clips or nail polish. Do not wear lotions, powders, or perfumes.  Do not shave 48 hours prior to surgery.  Do not bring valuables to the hospital.    [facility] is not responsible for any belongings or valuables.  Contacts, dentures or bridgework may not be worn into surgery. Leave your suitcase in the car. After surgery it may  be brought to your room. For patients admitted to the hospital, discharge time is determined by your treatment team.   Patients discharged the day of surgery will not be allowed to drive home.   Make arrangements for someone to be with you for the first 24 hours of your Same Day Discharge.    Please read over the following fact sheets that you were given:    _x___ Take these medicines the morning of surgery with A SIP OF WATER:    1. acetaminophen (TYLENOL) 500 MG tablet if needed  2. amLODipine (NORVASC) 2.5 MG tablet  3. atorvastatin (LIPITOR) 10 MG tablet  4.metoprolol succinate (TOPROL-XL) 50 MG 24 hr tablet  5.omeprazole (PRILOSEC) 40 MG capsule night before and morning of surgery  6.  ____ Fleet Enema (as directed)   __x__ Use CHG Soap (or wipes) as directed  ____ Use Benzoyl Peroxide Gel as instructed  ____ Use inhalers on the day of surgery  ____ Stop metformin 2 days prior to surgery    ____ Take 1/2 of usual insulin dose the night before surgery. No insulin the morning          of surgery.   __x__ Stopped aspirin already  ____ Stop Anti-inflammatories on    ____ Stop supplements until after surgery.    ____ Bring C-Pap to the hospital.    If you have any questions regarding your pre-procedure instructions,  Please call Pre-admit Testing at [facility]

## 2020-03-19 LAB — SARS CORONAVIRUS 2 (TAT 6-24 HRS): SARS Coronavirus 2: NEGATIVE

## 2020-03-22 ENCOUNTER — Encounter
Admission: RE | Disposition: A | Discharge status: Home / Self Care | Payer: Self-pay | Source: Home / Self Care | Attending: Neurosurgery

## 2020-03-22 ENCOUNTER — Other Ambulatory Visit: Payer: Self-pay

## 2020-03-22 ENCOUNTER — Ambulatory Visit
Admission: RE | Admit: 2020-03-22 | Discharge: 2020-03-22 | Disposition: A | Discharge status: Home / Self Care | Payer: Medicare HMO | Attending: Neurosurgery | Admitting: Neurosurgery

## 2020-03-22 ENCOUNTER — Ambulatory Visit: Payer: Medicare HMO | Admitting: Anesthesiology

## 2020-03-22 ENCOUNTER — Ambulatory Visit: Payer: Medicare HMO

## 2020-03-22 ENCOUNTER — Encounter: Payer: Self-pay | Admitting: Neurosurgery

## 2020-03-22 ENCOUNTER — Ambulatory Visit: Payer: Medicare HMO | Admitting: Urgent Care

## 2020-03-22 DIAGNOSIS — Z90722 Acquired absence of ovaries, bilateral: Secondary | ICD-10-CM | POA: Diagnosis not present

## 2020-03-22 DIAGNOSIS — M8580 Other specified disorders of bone density and structure, unspecified site: Secondary | ICD-10-CM | POA: Diagnosis not present

## 2020-03-22 DIAGNOSIS — Z833 Family history of diabetes mellitus: Secondary | ICD-10-CM | POA: Insufficient documentation

## 2020-03-22 DIAGNOSIS — G894 Chronic pain syndrome: Secondary | ICD-10-CM | POA: Insufficient documentation

## 2020-03-22 DIAGNOSIS — Z7982 Long term (current) use of aspirin: Secondary | ICD-10-CM | POA: Insufficient documentation

## 2020-03-22 DIAGNOSIS — R42 Dizziness and giddiness: Secondary | ICD-10-CM | POA: Insufficient documentation

## 2020-03-22 DIAGNOSIS — Z90710 Acquired absence of both cervix and uterus: Secondary | ICD-10-CM | POA: Insufficient documentation

## 2020-03-22 DIAGNOSIS — K219 Gastro-esophageal reflux disease without esophagitis: Secondary | ICD-10-CM | POA: Insufficient documentation

## 2020-03-22 DIAGNOSIS — E559 Vitamin D deficiency, unspecified: Secondary | ICD-10-CM | POA: Insufficient documentation

## 2020-03-22 DIAGNOSIS — F419 Anxiety disorder, unspecified: Secondary | ICD-10-CM | POA: Insufficient documentation

## 2020-03-22 DIAGNOSIS — Z79899 Other long term (current) drug therapy: Secondary | ICD-10-CM | POA: Insufficient documentation

## 2020-03-22 DIAGNOSIS — I10 Essential (primary) hypertension: Secondary | ICD-10-CM | POA: Diagnosis not present

## 2020-03-22 DIAGNOSIS — Z8249 Family history of ischemic heart disease and other diseases of the circulatory system: Secondary | ICD-10-CM | POA: Insufficient documentation

## 2020-03-22 DIAGNOSIS — Z981 Arthrodesis status: Secondary | ICD-10-CM | POA: Insufficient documentation

## 2020-03-22 DIAGNOSIS — Z9049 Acquired absence of other specified parts of digestive tract: Secondary | ICD-10-CM | POA: Insufficient documentation

## 2020-03-22 DIAGNOSIS — Z8542 Personal history of malignant neoplasm of other parts of uterus: Secondary | ICD-10-CM | POA: Diagnosis not present

## 2020-03-22 DIAGNOSIS — Z419 Encounter for procedure for purposes other than remedying health state, unspecified: Secondary | ICD-10-CM

## 2020-03-22 DIAGNOSIS — Z8042 Family history of malignant neoplasm of prostate: Secondary | ICD-10-CM | POA: Insufficient documentation

## 2020-03-22 HISTORY — PX: SPINAL CORD STIMULATOR TRIAL: SHX5380

## 2020-03-22 LAB — ABO/RH: ABO/RH(D): A POS

## 2020-03-22 SURGERY — LUMBAR SPINAL CORD STIMULATOR TRIAL
Anesthesia: Monitor Anesthesia Care

## 2020-03-22 MED ORDER — ONDANSETRON HCL 4 MG/2ML IJ SOLN
INTRAMUSCULAR | Status: DC | PRN
  Administered 2020-03-22: 4 mg via INTRAVENOUS

## 2020-03-22 MED ORDER — CHLORHEXIDINE GLUCONATE 0.12 % MT SOLN
OROMUCOSAL | Status: AC
  Administered 2020-03-22: 15 mL via OROMUCOSAL
  Filled 2020-03-22: qty 15

## 2020-03-22 MED ORDER — LIDOCAINE-EPINEPHRINE 1 %-1:100000 IJ SOLN
INTRAMUSCULAR | Status: DC | PRN
  Administered 2020-03-22: 31 mL

## 2020-03-22 MED ORDER — ACETAMINOPHEN 500 MG PO TABS
1000.0000 mg | ORAL_TABLET | Freq: Once | ORAL | Status: AC
  Administered 2020-03-22: 1000 mg via ORAL

## 2020-03-22 MED ORDER — FENTANYL CITRATE (PF) 100 MCG/2ML IJ SOLN: 25.0000 ug | INTRAMUSCULAR | Status: DC | PRN

## 2020-03-22 MED ORDER — MIDAZOLAM HCL 2 MG/2ML IJ SOLN
INTRAMUSCULAR | Status: DC | PRN
  Administered 2020-03-22: 1 mg via INTRAVENOUS

## 2020-03-22 MED ORDER — PROPOFOL 10 MG/ML IV BOLUS
INTRAVENOUS | Status: AC
  Filled 2020-03-22: qty 60

## 2020-03-22 MED ORDER — PHENYLEPHRINE HCL (PRESSORS) 10 MG/ML IV SOLN
INTRAVENOUS | Status: DC | PRN
  Administered 2020-03-22 (×3): 100 ug via INTRAVENOUS
  Administered 2020-03-22 (×2): 50 ug via INTRAVENOUS

## 2020-03-22 MED ORDER — CEPHALEXIN 500 MG PO CAPS: 500.0000 mg | ORAL_CAPSULE | Freq: Four times a day (QID) | ORAL | 0 refills | Status: AC

## 2020-03-22 MED ORDER — FENTANYL CITRATE (PF) 100 MCG/2ML IJ SOLN
INTRAMUSCULAR | Status: DC | PRN
  Administered 2020-03-22 (×2): 25 ug via INTRAVENOUS

## 2020-03-22 MED ORDER — ORAL CARE MOUTH RINSE: 15.0000 mL | Freq: Once | OROMUCOSAL | Status: AC

## 2020-03-22 MED ORDER — FENTANYL CITRATE (PF) 100 MCG/2ML IJ SOLN
INTRAMUSCULAR | Status: AC
  Filled 2020-03-22: qty 2

## 2020-03-22 MED ORDER — ACETAMINOPHEN 500 MG PO TABS
ORAL_TABLET | ORAL | Status: AC
  Filled 2020-03-22: qty 2

## 2020-03-22 MED ORDER — CEFAZOLIN SODIUM-DEXTROSE 2-4 GM/100ML-% IV SOLN
2.0000 g | INTRAVENOUS | Status: AC
  Administered 2020-03-22: 2 g via INTRAVENOUS

## 2020-03-22 MED ORDER — MIDAZOLAM HCL 2 MG/2ML IJ SOLN
INTRAMUSCULAR | Status: AC
  Filled 2020-03-22: qty 2

## 2020-03-22 MED ORDER — LACTATED RINGERS IV SOLN: INTRAVENOUS | Status: DC

## 2020-03-22 MED ORDER — ONDANSETRON HCL 4 MG/2ML IJ SOLN: 4.0000 mg | Freq: Once | INTRAMUSCULAR | Status: DC | PRN

## 2020-03-22 MED ORDER — CEFAZOLIN SODIUM-DEXTROSE 2-4 GM/100ML-% IV SOLN
INTRAVENOUS | Status: AC
  Filled 2020-03-22: qty 100

## 2020-03-22 MED ORDER — CHLORHEXIDINE GLUCONATE 0.12 % MT SOLN: 15.0000 mL | Freq: Once | OROMUCOSAL | Status: AC

## 2020-03-22 SURGICAL SUPPLY — 35 items
ADH SKN CLS APL DERMABOND .7 (GAUZE/BANDAGES/DRESSINGS) ×1
APL PRP STRL LF DISP 70% ISPRP (MISCELLANEOUS) ×2
CANISTER SUCT 1200ML W/VALVE (MISCELLANEOUS) IMPLANT
CHLORAPREP W/TINT 26 (MISCELLANEOUS) ×4 IMPLANT
COUNTER NEEDLE 20/40 LG (NEEDLE) ×2 IMPLANT
COVER LIGHT HANDLE STERIS (MISCELLANEOUS) ×4 IMPLANT
COVER WAND RF STERILE (DRAPES) ×2 IMPLANT
DERMABOND ADVANCED (GAUZE/BANDAGES/DRESSINGS) ×1
DERMABOND ADVANCED .7 DNX12 (GAUZE/BANDAGES/DRESSINGS) ×1 IMPLANT
DRAPE C-ARM XRAY 36X54 (DRAPES) ×4 IMPLANT
DRAPE C-ARMOR (DRAPES) ×2 IMPLANT
DRAPE LAPAROTOMY 100X77 ABD (DRAPES) ×2 IMPLANT
DRAPE SURG 17X11 SM STRL (DRAPES) ×2 IMPLANT
DRSG TEGADERM 4X4.75 (GAUZE/BANDAGES/DRESSINGS) ×10 IMPLANT
ELECT CAUTERY BLADE TIP 2.5 (TIP) ×2
ELECTRODE CAUTERY BLDE TIP 2.5 (TIP) ×1 IMPLANT
GAUZE SPONGE 4X4 12PLY STRL (GAUZE/BANDAGES/DRESSINGS) IMPLANT
GLOVE BIOGEL PI IND STRL 7.0 (GLOVE) ×1 IMPLANT
GLOVE BIOGEL PI IND STRL 8 (GLOVE) ×1 IMPLANT
GLOVE BIOGEL PI INDICATOR 7.0 (GLOVE) ×1
GLOVE BIOGEL PI INDICATOR 8 (GLOVE) ×1
GLOVE SURG SYN 7.0 (GLOVE) ×4 IMPLANT
GLOVE SURG SYN 8.0 (GLOVE) ×4 IMPLANT
GRADUATE 1200CC STRL 31836 (MISCELLANEOUS) ×2 IMPLANT
KIT TURNOVER KIT A (KITS) ×2 IMPLANT
LEAD KIT TRAIL 90CM (Lead) ×2 IMPLANT
MARKER SKIN DUAL TIP RULER LAB (MISCELLANEOUS) ×2 IMPLANT
NS IRRIG 1000ML POUR BTL (IV SOLUTION) ×2 IMPLANT
PACK LAMINECTOMY NEURO (CUSTOM PROCEDURE TRAY) ×2 IMPLANT
PAD ARMBOARD 7.5X6 YLW CONV (MISCELLANEOUS) ×2 IMPLANT
STIMULATOR WIRELESS 74X79X20MM (MISCELLANEOUS) ×2 IMPLANT
SUT ETHILON 3-0 FS-10 30 BLK (SUTURE) ×6
SUTURE EHLN 3-0 FS-10 30 BLK (SUTURE) ×3 IMPLANT
SYR 3ML LL SCALE MARK (SYRINGE) ×2 IMPLANT
TOWEL OR 17X26 4PK STRL BLUE (TOWEL DISPOSABLE) ×4 IMPLANT

## 2020-03-22 NOTE — H&P (Signed)
Valerie [name] is an 69 y.o. female.   Chief Complaint: Neck and left arm pain HPI:Valerie [name] is here for evaluation of her ongoing neck and left arm pain. She did have a recent nerve conduction study which revealed a mild carpal tunnel pathology as well as chronic C6 radiculopathy. She does have some mild numbness in the left fingertips. She does not endorse any new changes. The neck pain continues to be the largest concern for her in the left arm pain is still present.   Past Medical History:  Diagnosis Date  . Acid reflux   . Adenocarcinoma of the endometrium/uterus ([place]) 12/04/2000  . Anal fissure   . Atrophic vaginitis   . Difficult intubation    limited neck mobility  . Esophagitis   . Hernia    Present to proximal to umbillical   . Hypertension   . Internal hemorrhoids   . Menorrhagia   . Osteopenia   . PONV (postoperative nausea and vomiting)   . Vertigo   . Vitamin D deficiency     Past Surgical History:  Procedure Laterality Date  . [place] STUDY N/A 11/29/2015   Procedure: [place] STUDY;  Surgeon: Manus Gunning, MD;  Location: [facility];  Service: Gastroenterology;  Laterality: N/A;  . ABDOMINAL HYSTERECTOMY  12/04/2000   also bilateral salpingo-oophorectomy  . CARDIAC CATHETERIZATION  2006  . CARPAL TUNNEL RELEASE  11/20/2011   Procedure: CARPAL TUNNEL RELEASE;  Surgeon: Elaina Hoops, MD;  Location: [facility];  Service: Neurosurgery;  Laterality: Left;  LEFT carpal tunnel release  . CERVICAL SPINE SURGERY  2013   [place]  . DILATION AND CURETTAGE OF UTERUS     11/15/2000  . ESOPHAGEAL MANOMETRY N/A 09/20/2015   Procedure: ESOPHAGEAL MANOMETRY (EM);  Surgeon: Manus Gunning, MD;  Location: [facility];  Service: Gastroenterology;  Laterality: N/A;  . HYSTEROSCOPY  11/15/2000   D & C, resection of endometrial polyps  . OOPHORECTOMY  12/04/2000   bilateral salpingo-oophorectomy done with TAH  . TUBAL LIGATION       Family History  Problem Relation Age of Onset  . Hypertension Father   . Prostate cancer Father   . Hypertension Sister   . Diabetes Brother   . Heart disease Brother   . Hypertension Brother   . Diabetes Brother   . Heart disease Brother   . Breast cancer Neg Hx    Social History:  reports that she has never smoked. She has never used smokeless tobacco. She reports that she does not drink alcohol and does not use drugs.  Allergies: No Known Allergies  Medications Prior to Admission  Medication Sig Dispense Refill  . acetaminophen (TYLENOL) 500 MG tablet Take 500-1,000 mg by mouth every 6 (six) hours as needed (for pain).    Marland Kitchen amLODipine (NORVASC) 2.5 MG tablet Take 2.5 mg by mouth daily.    Marland Kitchen aspirin EC 81 MG tablet Take 81 mg by mouth daily.    Marland Kitchen atorvastatin (LIPITOR) 10 MG tablet TAKE 1 TABLET ONCE DAILY FOR CHOLESTEROL. (Patient taking differently: Take 10 mg by mouth daily. ) 90 tablet 2  . Calcium Carbonate-Vit D-Min (CALTRATE 600+D PLUS PO) Take 1 tablet by mouth 2 (two) times daily.      Marland Kitchen losartan (COZAAR) 100 MG tablet TAKE 1 TABLET ONCE DAILY FOR BLOOD PRESSURE. (Patient taking differently: Take 100 mg by mouth daily. ) 90 tablet 2  . metoprolol succinate (  TOPROL-XL) 50 MG 24 hr tablet TAKE 1 TABLET (50 MG TOTAL) BY MOUTH DAILY. TAKE WITH OR IMMEDIATELY FOLLOWING A MEAL. 90 tablet 1  . omeprazole (PRILOSEC) 40 MG capsule TAKE 1 CAPSULE (40 MG TOTAL) BY MOUTH DAILY. FOR HEARTBURN. 90 capsule 3    No results found for this or any previous visit (from the past 48 hour(s)). No results found.  Review of Systems General ROS: Negative Respiratory ROS: Negative Cardiovascular ROS: Negative Gastrointestinal ROS: Negative Genito-Urinary ROS: Negative Musculoskeletal ROS: Positive for neck pain Neurological ROS: Positive for left arm pain, numbness Dermatological ROS: Negative  Blood pressure 135/85, pulse 90, temperature 97.9 F (36.6 C), temperature source Oral,  resp. rate 18, height 5\' 5"  (1.651 m), weight 66.8 kg, SpO2 98 %. Physical Exam  General appearance: Alert, cooperative, in no acute distress CV: Regular rate and rhythm Pulm: Clear to auscultation  Neurologic exam:  Mental status: alertness: alert, affect: normal Speech: fluent and clear Motor: Grossly symmetric strength throughout Gait: normal    Assessment/Plan Neck and left arm pain after prior cervical spine fusion  - Will proceed with SCS trial  [name], MD 03/22/2020, 6:54 AM

## 2020-03-22 NOTE — Progress Notes (Signed)
°   03/22/20 0750  Clinical Encounter Type  Visited With Family  Visit Type Initial  Referral From Chaplain  Consult/Referral To Chaplain  While rounding SDS waiting area, chaplain briefly spoke to Pt's husband. He said she was in pre-op. Chaplain asked if he was receiving text and he said yes. Pt's husband did not have any questions or concerns.

## 2020-03-22 NOTE — Op Note (Signed)
SURGERY DATE:03/22/2020  PRE-OP DIAGNOSIS: Chronic pain syndrome  POST-OP DIAGNOSIS:Post-Op Diagnosis Codes: Chronic pain syndrome  Procedure(s) with comments: Percutaneous spinal cord stimulator lead placement  SURGEON:  * Malen Gauze, MD Lonell Face - assistant   ANESTHESIA: Mild Sedation, Local  OPERATIVE FINDINGS:Successful placement of cervical spinal cord stimulatorlead  Indication Ms Coupland was seen in clinic on9/7with ongoing neck and left arm pain. MRI of the spine revealed no concerning stenosis at the level of the implant. The patient wished to proceed with SCS trial for treatment of pain.Risks including weakness, hematoma, infection, failure of pain relief, post-operative pain, stroke, heart attack, pneumonia, and spinal cord injury were discussed.    Procedure The patient was brought to the operating room where vascular access was obtained andgiven light sedation. Shewasplaced prone on gel rolls. Antibiotics were given.Fluoroscopy was used to confirm plannedentryinthoracic area at the level of T4-8. The patient was prepped and draped in a sterile fashion. A hard time out was performed. Local anesthetic was instilled intoplanned entry sites.  Next, a Touhy needle was used to insert in a paramedian approach to enter the interlaminar space at T4, There was difficulty here so it was attempted again at T6 and then T8. . Once loss of resistance was identified, this was confirmed with a metal stylette. Next, the percutaneous lead was passed in the rostral direction using fluoroscopy as guidance to keep in the midline. This was passed without resistance to the level of the C2vertebral body. Lateral views were obtained to confirm we were in the dorsal epidural space.We used the programmer to ensure these were covering the patients areas of pain.The lead was then secured to theskinafter removal of the needle and stylette.  A  final fluoroscopic image was taken show good placement ofpercutaneous lead. Sterile dressings were applied. The patient was returned to supine position. The patient was seen to be moving all extremities symmetrically and was taken to PACU for recovery. The family was updated and all questions answered.  ESTIMATED BLOOD LOSS: 10cc   IMPLANT NEURO LEAD TRAIL 90CM - [id number]  Inventory Item: NEURO LEAD TRAIL 90CM Serial no.:  Model/Cat no.: [id number]  Implant name: NEURO LEAD TRAIL 90CM - [id number] Laterality: N/A Area: Spine Thoracic  Manufacturer: [employer] NEUROMOD PAIN MGMT Date of Manufacture:    Action: Implanted Number Used: 1   Device Identifier:  Device Identifier Type:       I performed the case in its entiretywithassistance of Lonell Face, PA  [name], [place]

## 2020-03-22 NOTE — Progress Notes (Signed)
Pharmacy Antibiotic Note  Valerie [name] is a 69 y.o. female admitted on (Not on file) with surgical prophylaxis.  Pharmacy has been consulted for Cefazolin dosing.  Plan: Cefazolin 2 gm IV X 1  60 min pre-op.      No data recorded.  No results for input(s): WBC, CREATININE, LATICACIDVEN, VANCOTROUGH, VANCOPEAK, VANCORANDOM, GENTTROUGH, GENTPEAK, GENTRANDOM, TOBRATROUGH, TOBRAPEAK, TOBRARND, AMIKACINPEAK, AMIKACINTROU, AMIKACIN in the last 168 hours.  CrCl cannot be calculated (Patient's most recent lab result is older than the maximum 21 days allowed.).    No Known Allergies  Antimicrobials this admission:   >>    >>   Dose adjustments this admission:   Microbiology results:  BCx:  UCx:    Sputum:    MRSA PCR:   Thank you for allowing pharmacy to be a part of this patient's care.  Valerie [name] D 03/22/2020 12:22 AM

## 2020-03-22 NOTE — Discharge Instructions (Signed)
AMBULATORY SURGERY  DISCHARGE INSTRUCTIONS   1) The drugs that you were given will stay in your system until tomorrow so for the next 24 hours you should not:  A) Drive an automobile B) Make any legal decisions C) Drink any alcoholic beverage   2) You may resume regular meals tomorrow.  Today it is better to start with liquids and gradually work up to solid foods.  You may eat anything you prefer, but it is better to start with liquids, then soup and crackers, and gradually work up to solid foods.   3) Please notify your doctor immediately if you have any unusual bleeding, trouble breathing, redness and pain at the surgery site, drainage, fever, or pain not relieved by medication.    4) Additional Instructions:        Please contact your physician with any problems or Same Day Surgery at [phone], Monday through Friday 6 am to 4 pm, or [place] at [facility] number at [phone].Hold Aspirin and NSAIDs, do not shower or remove dressings.  Your surgeon has performed an operation on your upper back.  Many times, patients feel better immediately after surgery and can "overdo it." Even if you feel well, it is important that you follow these activity guidelines. If you do not let your back heal properly from the surgery, you can increase the chance of a disc herniation and/or return of your symptoms. The following are instructions to help in your recovery once you have been discharged from the hospital.   Activity    No bending, lifting, or twisting ("BLT"). Avoid lifting objects heavier than 10 pounds (gallon milk jug).  Where possible, avoid household activities that involve lifting, bending, pushing, or pulling such as laundry, vacuuming, grocery shopping, and childcare. Try to arrange for help from friends and family for these activities while your back heals.   Talk to your doctor before resuming sexual activity.  You should not drive until cleared by your  doctor.  Until released by your doctor, you should not return to work or school.  You should rest at home and let your body heal.    If you smoke, we strongly recommend that you quit.  Smoking has been proven to interfere with normal healing in your back and will dramatically reduce the success rate of your surgery. Please contact [facility] (800-QUIT-NOW) and use the resources at www.[facility].com for assistance in stopping smoking.  Surgical Incision   DO NOT REMOVE DRESSING, DO NOT SHOWER.  Diet            You may return to your usual diet. Be sure to stay hydrated.  When to Contact us  Although your surgery and recovery will likely be uneventful, you may have some residual numbness, aches, and pains in your back and/or legs. This is normal and should improve in the next few weeks.  However, should you experience any of the following, contact us immediately: . New numbness or weakness . Pain that is progressively getting worse, and is not relieved by your pain medications or rest . Bleeding, redness, swelling, pain, or drainage from surgical incision . Chills or flu-like symptoms . Fever greater than 101.0 F (38.3 C) . Problems with bowel or bladder functions . Difficulty breathing or shortness of breath . Warmth, tenderness, or swelling in your calf  Contact Information . During office hours (Monday-Friday 9 am to 5 pm), please call your physician at [phone] . After hours and weekends, please call [phone] and an  answering service will put you in touch with either Dr. Lacinda Axon or Dr. Izora Ribas.  . For a life-threatening emergency, call 911

## 2020-03-22 NOTE — Discharge Summary (Signed)
  Procedure: Cervical SCS trial Procedure date: 03/22/2020 Diagnosis: cervicalgia, failed back surgery    History: Valerie [name] is s/p Cervical SCS trial POD0: Tolerated procedure well. Evaluated in post op recovery still disoriented from anesthesia but able to answer questions and obey commands.   Physical Exam: Vitals:   03/22/20 0930 03/22/20 0933  BP:  (!) 91/52  Pulse: 84 78  Resp: 17 19  Temp:    SpO2: 91% 96%    General: Alert and oriented, sitting in bed Strength:5/5 throughout  Sensation: intact and symmetric throughout  Skin: bandages over back c/d/i  Data:  No results for input(s): NA, K, CL, CO2, BUN, CREATININE, LABGLOM, GLUCOSE, CALCIUM in the last 168 hours. No results for input(s): AST, ALT, ALKPHOS in the last 168 hours.  Invalid input(s): TBILI   No results for input(s): WBC, HGB, HCT, PLT in the last 168 hours. Recent Labs  Lab 03/18/20 1057  APTT 28  INR 1.0         Assessment/Plan:  Valerie [name] is POD0 s/p Cervical SCS trial.  She is cleared for discharge once she is able to urinate, tolerate PO, and ambulate.  She will be given Keflex 500mg  QID x 7 days and must leave bandages intact until her follow up in one week with Dr Lacinda Axon.    Lonell Face, NP Department of Neurosurgery

## 2020-03-22 NOTE — Transfer of Care (Signed)
Immediate Anesthesia Transfer of Care Note  Patient: Valerie [name]  Procedure(s) Performed: CERVICAL SPINAL CORD STIMULATOR TRIAL PERCUTANEOUS (N/A )  Patient Location: PACU  Anesthesia Type:MAC  Level of Consciousness: awake, alert , oriented and patient cooperative  Airway & Oxygen Therapy: Patient Spontanous Breathing  Post-op Assessment: Report given to RN and Post -op Vital signs reviewed and stable  Post vital signs: Reviewed and stable  Last Vitals:  Vitals Value Taken Time  BP 90/50 03/22/20 0917  Temp    Pulse 72 03/22/20 0920  Resp 18 03/22/20 0920  SpO2 93 % 03/22/20 0920  Vitals shown include unvalidated device data.  Last Pain:  Vitals:   03/22/20 0615  TempSrc: Oral  PainSc: 5          Complications: No complications documented.

## 2020-03-22 NOTE — Anesthesia Preprocedure Evaluation (Signed)
Anesthesia Evaluation  Patient identified by MRN, date of birth, ID band Patient awake    Reviewed: Allergy & Precautions, H&P , NPO status , Patient's Chart, lab work & pertinent test results, reviewed documented beta blocker date and time   History of Anesthesia Complications (+) PONV, DIFFICULT AIRWAY and history of anesthetic complications  Airway Mallampati: II  TM Distance: >3 FB Neck ROM: full    Dental no notable dental hx. (+) Teeth Intact   Pulmonary neg pulmonary ROS,    Pulmonary exam normal breath sounds clear to auscultation       Cardiovascular Exercise Tolerance: Good hypertension, On Medications negative cardio ROS   Rhythm:regular Rate:Normal     Neuro/Psych PSYCHIATRIC DISORDERS Anxiety negative neurological ROS     GI/Hepatic Neg liver ROS, GERD  Medicated,  Endo/Other  negative endocrine ROSdiabetes  Renal/GU      Musculoskeletal   Abdominal   Peds  Hematology negative hematology ROS (+)   Anesthesia Other Findings   Reproductive/Obstetrics negative OB ROS                             Anesthesia Physical Anesthesia Plan  ASA: II  Anesthesia Plan: MAC   Post-op Pain Management:    Induction:   PONV Risk Score and Plan:   Airway Management Planned:   Additional Equipment:   Intra-op Plan:   Post-operative Plan:   Informed Consent: I have reviewed the patients History and Physical, chart, labs and discussed the procedure including the risks, benefits and alternatives for the proposed anesthesia with the patient or authorized representative who has indicated his/her understanding and acceptance.       Plan Discussed with: CRNA  Anesthesia Plan Comments:         Anesthesia Quick Evaluation

## 2020-03-22 NOTE — Interval H&P Note (Signed)
History and Physical Interval Note:  03/22/2020 6:56 AM  Valerie [name]  has presented today for surgery, with the diagnosis of cervicalgia, failed back surgery.  The various methods of treatment have been discussed with the patient and family. After consideration of risks, benefits and other options for treatment, the patient has consented to  Procedure(s): CERVICAL SPINAL CORD STIMULATOR TRIAL PERCUTANEOUS (N/A) as a surgical intervention.  The patient's history has been reviewed, patient examined, no change in status, stable for surgery.  I have reviewed the patient's chart and labs.  Questions were answered to the patient's satisfaction.     [name]

## 2020-03-23 ENCOUNTER — Encounter: Payer: Self-pay | Admitting: Neurosurgery

## 2020-03-23 ENCOUNTER — Other Ambulatory Visit: Payer: Self-pay | Admitting: Primary Care

## 2020-03-23 DIAGNOSIS — E559 Vitamin D deficiency, unspecified: Secondary | ICD-10-CM

## 2020-03-23 DIAGNOSIS — E538 Deficiency of other specified B group vitamins: Secondary | ICD-10-CM

## 2020-03-23 DIAGNOSIS — R7303 Prediabetes: Secondary | ICD-10-CM

## 2020-03-24 ENCOUNTER — Other Ambulatory Visit: Payer: Medicare HMO

## 2020-03-30 ENCOUNTER — Other Ambulatory Visit: Payer: Self-pay | Admitting: Neurosurgery

## 2020-03-31 ENCOUNTER — Encounter: Payer: Medicare HMO | Admitting: Primary Care

## 2020-04-05 ENCOUNTER — Encounter
Admission: RE | Admit: 2020-04-05 | Discharge: 2020-04-05 | Disposition: A | Discharge status: Home / Self Care | Payer: Medicare HMO | Source: Ambulatory Visit | Attending: Neurosurgery | Admitting: Neurosurgery

## 2020-04-05 ENCOUNTER — Other Ambulatory Visit: Payer: Self-pay

## 2020-04-05 DIAGNOSIS — Z01818 Encounter for other preprocedural examination: Secondary | ICD-10-CM | POA: Diagnosis not present

## 2020-04-05 HISTORY — DX: Personal history of other diseases of the digestive system: Z87.19

## 2020-04-05 LAB — TYPE AND SCREEN
ABO/RH(D): A POS
Antibody Screen: NEGATIVE

## 2020-04-05 NOTE — Anesthesia Postprocedure Evaluation (Signed)
Anesthesia Post Note  Patient: Valerie [name]  Procedure(s) Performed: CERVICAL SPINAL CORD STIMULATOR TRIAL PERCUTANEOUS (N/A )  Patient location during evaluation: PACU Anesthesia Type: MAC Level of consciousness: awake and alert Pain management: pain level controlled Vital Signs Assessment: post-procedure vital signs reviewed and stable Respiratory status: spontaneous breathing, nonlabored ventilation, respiratory function stable and patient connected to nasal cannula oxygen Cardiovascular status: blood pressure returned to baseline and stable Postop Assessment: no apparent nausea or vomiting Anesthetic complications: no   No complications documented.   Last Vitals:  Vitals:   03/22/20 1042 03/22/20 1101  BP: 110/63 105/66  Pulse: 76 70  Resp: 16 16  Temp: 36.8 C   SpO2: 100% 95%    Last Pain:  Vitals:   03/22/20 1101  TempSrc:   PainSc: 0-No pain                 Molli Barrows

## 2020-04-05 NOTE — Patient Instructions (Addendum)
Your procedure is scheduled on: Monday, November 15 Report to the Registration Desk on the 1st floor of the [facility]. To find out your arrival time, please call [phone] between 1PM - 3PM on: Friday, November 12  REMEMBER: Instructions that are not followed completely may result in serious medical risk, up to and including death; or upon the discretion of your surgeon and anesthesiologist your surgery may need to be rescheduled.  Do not eat food after midnight the night before surgery.  No gum chewing, lozengers or hard candies.  You may however, drink CLEAR liquids up to 2 hours before you are scheduled to arrive for your surgery. Do not drink anything within 2 hours of your scheduled arrival time.  Clear liquids include: - water  - apple juice without pulp - gatorade (not RED, PURPLE, OR BLUE) - black coffee or tea (Do NOT add milk or creamers to the coffee or tea) Do NOT drink anything that is not on this list.  TAKE THESE MEDICATIONS THE MORNING OF SURGERY WITH A SIP OF WATER:  1.  Amlodipine 2.  Metoprolol 3.  Omeprazole - (take one the night before and one on the morning of surgery - helps to prevent nausea after surgery.) 4.  Atorvastatin  Follow recommendations from Cardiologist, Pulmonologist or PCP regarding stopping Aspirin. Already stopped.  One week prior to surgery: Stop Anti-inflammatories (NSAIDS) such as Advil, Aleve, Ibuprofen, Motrin, Naproxen, Naprosyn and Aspirin based products such as Excedrin, Goodys Powder, BC Powder. Stop ANY OVER THE COUNTER supplements until after surgery. (However, you may continue taking Tylenol and Vitamin B up until the day before surgery.)  No Alcohol for 24 hours before or after surgery.  On the morning of surgery brush your teeth with toothpaste and water, you may rinse your mouth with mouthwash if you wish. Do not swallow any toothpaste or mouthwash.  Do not wear jewelry, make-up, hairpins, clips or nail polish.  Do  not wear lotions, powders, or perfumes.   Do not shave 48 hours prior to surgery.   Contact lenses, hearing aids and dentures may not be worn into surgery.  Do not bring valuables to the hospital. [facility] is not responsible for any missing/lost belongings or valuables.   Use CHG Soap as directed on instruction sheet.  Notify your doctor if there is any change in your medical condition (cold, fever, infection).  Wear comfortable clothing (specific to your surgery type) to the hospital.  Plan for stool softeners for home use; pain medications have a tendency to cause constipation. You can also help prevent constipation by eating foods high in fiber such as fruits and vegetables and drinking plenty of fluids as your diet allows.  After surgery, you can help prevent lung complications by doing breathing exercises.  Take deep breaths and cough every 1-2 hours. Your doctor may order a device called an Incentive Spirometer to help you take deep breaths.  If you are being discharged the day of surgery, you will not be allowed to drive home. You will need a responsible adult (18 years or older) to drive you home and stay with you that night.   If you are taking public transportation, you will need to have a responsible adult (18 years or older) with you. Please confirm with your physician that it is acceptable to use public transportation.   Please call the [place] Dept. at [phone] if you have any questions about these instructions.  Visitation Policy:  Patients undergoing  a surgery or procedure may have one family member or support person with them as long as that person is not COVID-19 positive or experiencing its symptoms.  That person may remain in the waiting area during the procedure.

## 2020-04-08 ENCOUNTER — Other Ambulatory Visit
Admission: RE | Admit: 2020-04-08 | Discharge: 2020-04-08 | Disposition: A | Discharge status: Home / Self Care | Payer: Medicare HMO | Source: Ambulatory Visit | Attending: Neurosurgery | Admitting: Neurosurgery

## 2020-04-08 ENCOUNTER — Other Ambulatory Visit: Payer: Self-pay

## 2020-04-08 DIAGNOSIS — Z20822 Contact with and (suspected) exposure to covid-19: Secondary | ICD-10-CM | POA: Diagnosis not present

## 2020-04-08 DIAGNOSIS — Z01812 Encounter for preprocedural laboratory examination: Secondary | ICD-10-CM | POA: Diagnosis present

## 2020-04-09 LAB — SARS CORONAVIRUS 2 (TAT 6-24 HRS): SARS Coronavirus 2: NEGATIVE

## 2020-04-12 ENCOUNTER — Ambulatory Visit: Payer: Medicare HMO | Admitting: Anesthesiology

## 2020-04-12 ENCOUNTER — Ambulatory Visit: Payer: Medicare HMO

## 2020-04-12 ENCOUNTER — Encounter
Admission: RE | Disposition: A | Discharge status: Home / Self Care | Payer: Self-pay | Source: Home / Self Care | Attending: Neurosurgery

## 2020-04-12 ENCOUNTER — Ambulatory Visit
Admission: RE | Admit: 2020-04-12 | Discharge: 2020-04-12 | Disposition: A | Discharge status: Home / Self Care | Payer: Medicare HMO | Attending: Neurosurgery | Admitting: Neurosurgery

## 2020-04-12 ENCOUNTER — Other Ambulatory Visit: Payer: Self-pay

## 2020-04-12 ENCOUNTER — Encounter: Payer: Self-pay | Admitting: Neurosurgery

## 2020-04-12 DIAGNOSIS — Z7982 Long term (current) use of aspirin: Secondary | ICD-10-CM | POA: Diagnosis not present

## 2020-04-12 DIAGNOSIS — G894 Chronic pain syndrome: Secondary | ICD-10-CM | POA: Diagnosis present

## 2020-04-12 DIAGNOSIS — Z419 Encounter for procedure for purposes other than remedying health state, unspecified: Secondary | ICD-10-CM

## 2020-04-12 DIAGNOSIS — M542 Cervicalgia: Secondary | ICD-10-CM | POA: Insufficient documentation

## 2020-04-12 DIAGNOSIS — M79602 Pain in left arm: Secondary | ICD-10-CM | POA: Insufficient documentation

## 2020-04-12 DIAGNOSIS — Z79899 Other long term (current) drug therapy: Secondary | ICD-10-CM | POA: Diagnosis not present

## 2020-04-12 HISTORY — PX: SPINAL CORD STIMULATOR INSERTION: SHX5378

## 2020-04-12 SURGERY — INSERTION, SPINAL CORD STIMULATOR, CERVICAL
Anesthesia: General | Laterality: Bilateral

## 2020-04-12 MED ORDER — PROPOFOL 10 MG/ML IV BOLUS
INTRAVENOUS | Status: DC | PRN
  Administered 2020-04-12: 120 mg via INTRAVENOUS

## 2020-04-12 MED ORDER — PHENYLEPHRINE HCL (PRESSORS) 10 MG/ML IV SOLN
INTRAVENOUS | Status: DC | PRN
  Administered 2020-04-12: 50 ug via INTRAVENOUS

## 2020-04-12 MED ORDER — PROPOFOL 500 MG/50ML IV EMUL
INTRAVENOUS | Status: DC | PRN
  Administered 2020-04-12: 150 ug/kg/min via INTRAVENOUS

## 2020-04-12 MED ORDER — MIDAZOLAM HCL 2 MG/2ML IJ SOLN
INTRAMUSCULAR | Status: DC | PRN
  Administered 2020-04-12: 2 mg via INTRAVENOUS

## 2020-04-12 MED ORDER — CEFAZOLIN SODIUM-DEXTROSE 2-4 GM/100ML-% IV SOLN
2.0000 g | Freq: Once | INTRAVENOUS | Status: AC
  Administered 2020-04-12: 2 g via INTRAVENOUS

## 2020-04-12 MED ORDER — MIDAZOLAM HCL 2 MG/2ML IJ SOLN
INTRAMUSCULAR | Status: AC
  Filled 2020-04-12: qty 2

## 2020-04-12 MED ORDER — LIDOCAINE HCL (PF) 2 % IJ SOLN
INTRAMUSCULAR | Status: AC
  Filled 2020-04-12: qty 5

## 2020-04-12 MED ORDER — OXYCODONE HCL 5 MG PO TABS: 5.0000 mg | ORAL_TABLET | Freq: Once | ORAL | Status: DC | PRN

## 2020-04-12 MED ORDER — PROPOFOL 10 MG/ML IV BOLUS
INTRAVENOUS | Status: AC
  Filled 2020-04-12: qty 20

## 2020-04-12 MED ORDER — MEPERIDINE HCL 50 MG/ML IJ SOLN: 6.2500 mg | INTRAMUSCULAR | Status: DC | PRN

## 2020-04-12 MED ORDER — DEXAMETHASONE SODIUM PHOSPHATE 10 MG/ML IJ SOLN
INTRAMUSCULAR | Status: AC
  Filled 2020-04-12: qty 1

## 2020-04-12 MED ORDER — SUCCINYLCHOLINE CHLORIDE 20 MG/ML IJ SOLN
INTRAMUSCULAR | Status: DC | PRN
  Administered 2020-04-12: 100 mg via INTRAVENOUS

## 2020-04-12 MED ORDER — OXYCODONE HCL 5 MG PO TABS
ORAL_TABLET | ORAL | Status: AC
  Filled 2020-04-12: qty 1

## 2020-04-12 MED ORDER — OXYCODONE HCL 5 MG/5ML PO SOLN: 5.0000 mg | Freq: Once | ORAL | Status: DC | PRN

## 2020-04-12 MED ORDER — LACTATED RINGERS IV SOLN: INTRAVENOUS | Status: DC

## 2020-04-12 MED ORDER — CHLORHEXIDINE GLUCONATE 0.12 % MT SOLN
15.0000 mL | Freq: Once | OROMUCOSAL | Status: AC
  Administered 2020-04-12: 15 mL via OROMUCOSAL

## 2020-04-12 MED ORDER — LIDOCAINE HCL (CARDIAC) PF 100 MG/5ML IV SOSY
PREFILLED_SYRINGE | INTRAVENOUS | Status: DC | PRN
  Administered 2020-04-12: 40 mg via INTRAVENOUS
  Administered 2020-04-12: 60 mg via INTRAVENOUS

## 2020-04-12 MED ORDER — FENTANYL CITRATE (PF) 100 MCG/2ML IJ SOLN
INTRAMUSCULAR | Status: AC
  Filled 2020-04-12: qty 2

## 2020-04-12 MED ORDER — REMIFENTANIL HCL 1 MG IV SOLR
INTRAVENOUS | Status: AC
  Filled 2020-04-12: qty 1000

## 2020-04-12 MED ORDER — ONDANSETRON HCL 4 MG/2ML IJ SOLN
INTRAMUSCULAR | Status: DC | PRN
  Administered 2020-04-12: 4 mg via INTRAVENOUS

## 2020-04-12 MED ORDER — EPHEDRINE SULFATE 50 MG/ML IJ SOLN
INTRAMUSCULAR | Status: DC | PRN
  Administered 2020-04-12: 10 mg via INTRAVENOUS
  Administered 2020-04-12: 15 mg via INTRAVENOUS
  Administered 2020-04-12: 10 mg via INTRAVENOUS

## 2020-04-12 MED ORDER — SODIUM CHLORIDE 0.9 % IV SOLN
INTRAVENOUS | Status: DC | PRN
  Administered 2020-04-12: .1 ug/kg/min via INTRAVENOUS

## 2020-04-12 MED ORDER — METHOCARBAMOL 500 MG PO TABS: 500.0000 mg | ORAL_TABLET | Freq: Four times a day (QID) | ORAL | 0 refills | Status: DC | PRN

## 2020-04-12 MED ORDER — CHLORHEXIDINE GLUCONATE 0.12 % MT SOLN
OROMUCOSAL | Status: AC
  Filled 2020-04-12: qty 15

## 2020-04-12 MED ORDER — SODIUM CHLORIDE 0.9 % IV SOLN: INTRAVENOUS | Status: DC | PRN

## 2020-04-12 MED ORDER — PROPOFOL 500 MG/50ML IV EMUL
INTRAVENOUS | Status: AC
  Filled 2020-04-12: qty 50

## 2020-04-12 MED ORDER — FENTANYL CITRATE (PF) 100 MCG/2ML IJ SOLN: 25.0000 ug | INTRAMUSCULAR | Status: DC | PRN

## 2020-04-12 MED ORDER — LIDOCAINE-EPINEPHRINE 1 %-1:100000 IJ SOLN
INTRAMUSCULAR | Status: DC | PRN
  Administered 2020-04-12: 14 mL

## 2020-04-12 MED ORDER — VANCOMYCIN HCL 1000 MG IV SOLR
INTRAVENOUS | Status: DC | PRN
  Administered 2020-04-12: 1000 mg via TOPICAL

## 2020-04-12 MED ORDER — ORAL CARE MOUTH RINSE: 15.0000 mL | Freq: Once | OROMUCOSAL | Status: AC

## 2020-04-12 MED ORDER — OXYCODONE HCL 5 MG PO TABS
5.0000 mg | ORAL_TABLET | ORAL | 0 refills | Status: AC | PRN
Start: 2020-04-12 — End: 2020-04-17

## 2020-04-12 MED ORDER — CEFAZOLIN SODIUM-DEXTROSE 2-4 GM/100ML-% IV SOLN
INTRAVENOUS | Status: AC
  Filled 2020-04-12: qty 100

## 2020-04-12 MED ORDER — ONDANSETRON HCL 4 MG/2ML IJ SOLN
INTRAMUSCULAR | Status: AC
  Filled 2020-04-12: qty 2

## 2020-04-12 MED ORDER — FENTANYL CITRATE (PF) 100 MCG/2ML IJ SOLN
INTRAMUSCULAR | Status: DC | PRN
  Administered 2020-04-12: 50 ug via INTRAVENOUS

## 2020-04-12 MED ORDER — PROMETHAZINE HCL 25 MG/ML IJ SOLN: 6.2500 mg | INTRAMUSCULAR | Status: DC | PRN

## 2020-04-12 MED ORDER — SODIUM CHLORIDE 0.9 % IV SOLN
INTRAVENOUS | Status: DC | PRN
  Administered 2020-04-12: 25 ug/min via INTRAVENOUS

## 2020-04-12 MED ORDER — SUCCINYLCHOLINE CHLORIDE 200 MG/10ML IV SOSY
PREFILLED_SYRINGE | INTRAVENOUS | Status: AC
  Filled 2020-04-12: qty 10

## 2020-04-12 MED ORDER — DEXAMETHASONE SODIUM PHOSPHATE 10 MG/ML IJ SOLN
INTRAMUSCULAR | Status: DC | PRN
  Administered 2020-04-12: 10 mg via INTRAVENOUS

## 2020-04-12 SURGICAL SUPPLY — 71 items
ADH SKN CLS APL DERMABOND .7 (GAUZE/BANDAGES/DRESSINGS) ×1
AGENT HMST MTR 8 SURGIFLO (HEMOSTASIS)
APL PRP STRL LF DISP 70% ISPRP (MISCELLANEOUS) ×2
BUR NEURO DRILL SOFT 3.0X3.8M (BURR) IMPLANT
CANISTER SUCT 1200ML W/VALVE (MISCELLANEOUS) IMPLANT
CHLORAPREP W/TINT 26 (MISCELLANEOUS) ×4 IMPLANT
COLLAR CERV MED MED DENS 3 (SOFTGOODS) ×2 IMPLANT
COUNTER NEEDLE 20/40 LG (NEEDLE) ×2 IMPLANT
COVER LIGHT HANDLE STERIS (MISCELLANEOUS) ×4 IMPLANT
COVER WAND RF STERILE (DRAPES) ×2 IMPLANT
CUP MEDICINE 2OZ PLAST GRAD ST (MISCELLANEOUS) ×4 IMPLANT
DERMABOND ADVANCED (GAUZE/BANDAGES/DRESSINGS) ×1
DERMABOND ADVANCED .7 DNX12 (GAUZE/BANDAGES/DRESSINGS) ×1 IMPLANT
DEVICE IMPLANT NEUROSTIMULATOR (Neuro Prosthesis/Implant) ×2 IMPLANT
DRAPE C-ARM 42X72 X-RAY (DRAPES) ×4 IMPLANT
DRAPE INCISE IOBAN 66X45 STRL (DRAPES) ×4 IMPLANT
DRAPE THYROID T SHEET (DRAPES) ×2 IMPLANT
DRSG OPSITE POSTOP 4X6 (GAUZE/BANDAGES/DRESSINGS) IMPLANT
DRSG OPSITE POSTOP 4X8 (GAUZE/BANDAGES/DRESSINGS) IMPLANT
DRSG TEGADERM 4X4.75 (GAUZE/BANDAGES/DRESSINGS) ×2 IMPLANT
DRSG TELFA 4X3 1S NADH ST (GAUZE/BANDAGES/DRESSINGS) ×4 IMPLANT
ELECT CAUTERY BLADE TIP 2.5 (TIP) ×2
ELECTRODE CAUTERY BLDE TIP 2.5 (TIP) ×1 IMPLANT
ENVELOPE ABSORB ANTIBACTERIAL (Neuro Prosthesis/Implant) ×2 IMPLANT
FEE INTRAOP MONITOR IMPULS NCS (MISCELLANEOUS) ×1 IMPLANT
GAUZE SPONGE 4X4 12PLY STRL (GAUZE/BANDAGES/DRESSINGS) ×2 IMPLANT
GLOVE BIOGEL PI IND STRL 7.0 (GLOVE) ×1 IMPLANT
GLOVE BIOGEL PI IND STRL 8 (GLOVE) ×1 IMPLANT
GLOVE BIOGEL PI INDICATOR 7.0 (GLOVE) ×1
GLOVE BIOGEL PI INDICATOR 8 (GLOVE) ×1
GLOVE SURG SYN 7.0 (GLOVE) ×4 IMPLANT
GLOVE SURG SYN 8.0 (GLOVE) ×4 IMPLANT
GOWN STRL REUS W/ TWL XL LVL3 (GOWN DISPOSABLE) ×2 IMPLANT
GOWN STRL REUS W/TWL XL LVL3 (GOWN DISPOSABLE) ×4
GRADUATE 1200CC STRL 31836 (MISCELLANEOUS) ×2 IMPLANT
HEMOSTAT SURGICEL 2X3 (HEMOSTASIS) ×2 IMPLANT
INTRAOP MONITOR FEE IMPULS NCS (MISCELLANEOUS) ×1
INTRAOP MONITOR FEE IMPULSE (MISCELLANEOUS) ×2
IV CATH ANGIO 12GX3 LT BLUE (NEEDLE) ×2 IMPLANT
KIT TURNOVER KIT A (KITS) ×2 IMPLANT
LEAD NEURO 75CM CPT VE (Lead) ×2 IMPLANT
MANIFOLD NEPTUNE II (INSTRUMENTS) ×2 IMPLANT
MARKER SKIN DUAL TIP RULER LAB (MISCELLANEOUS) ×4 IMPLANT
NEEDLE HYPO 22GX1.5 SAFETY (NEEDLE) ×2 IMPLANT
NEURO LEAD 75CM CPT  VE (Lead) ×4 IMPLANT
NEURO LEAD 75CM CPT VE (Lead) ×2 IMPLANT
PACK LAMINECTOMY NEURO (CUSTOM PROCEDURE TRAY) ×2 IMPLANT
PAD ARMBOARD 7.5X6 YLW CONV (MISCELLANEOUS) ×2 IMPLANT
PAD TELFA 2X3 NADH STRL (GAUZE/BANDAGES/DRESSINGS) ×2 IMPLANT
PASSER CATH 38CM DISP (INSTRUMENTS) ×2 IMPLANT
PIN MAYFIELD SKULL DISP (PIN) IMPLANT
RECHARGER INTELLIS (NEUROSURGERY SUPPLIES) ×2 IMPLANT
REPROGRAMMER INTELLIS (NEUROSURGERY SUPPLIES) ×2 IMPLANT
SPOGE SURGIFLO 8M (HEMOSTASIS)
SPONGE KITTNER 5P (MISCELLANEOUS) IMPLANT
SPONGE SURGIFLO 8M (HEMOSTASIS) IMPLANT
STAPLER SKIN PROX 35W (STAPLE) IMPLANT
SUT BONE WAX W31G (SUTURE) ×2 IMPLANT
SUT ETHILON 3 0 PS 1 (SUTURE) IMPLANT
SUT ETHILON 3-0 FS-10 30 BLK (SUTURE) ×4
SUT NURALON 4 0 TR CR/8 (SUTURE) IMPLANT
SUT POLYSORB 2-0 5X18 GS-10 (SUTURE) ×6 IMPLANT
SUT PROLENE 5 0 RB 2 (SUTURE) IMPLANT
SUT VIC AB 0 CT1 18XCR BRD 8 (SUTURE) ×1 IMPLANT
SUT VIC AB 0 CT1 8-18 (SUTURE) ×2
SUTURE EHLN 3-0 FS-10 30 BLK (SUTURE) ×2 IMPLANT
SYR 30ML LL (SYRINGE) ×2 IMPLANT
TAPE CLOTH 3X10 WHT NS LF (GAUZE/BANDAGES/DRESSINGS) ×2 IMPLANT
TOWEL OR 17X26 4PK STRL BLUE (TOWEL DISPOSABLE) ×4 IMPLANT
TRAY FOLEY MTR SLVR 16FR STAT (SET/KITS/TRAYS/PACK) IMPLANT
TUBING CONNECTING 10 (TUBING) ×2 IMPLANT

## 2020-04-12 NOTE — H&P (Addendum)
Valerie [name] is an 69 y.o. female.   Chief Complaint: Chronic Pain Syndrome  HPI: Valerie [name] is here for removal of her cervical spinal cord stimulator trial. She states that she got 95% relief of her neck and left arm pain. She does not endorse any new symptoms. She was wearing the cervical collar and has restricted her motion. She is ready to proceed with the permanent implant.   Past Medical History:  Diagnosis Date  . Acid reflux   . Adenocarcinoma of the endometrium/uterus ([place]) 12/04/2000  . Anal fissure   . Atrophic vaginitis   . Difficult intubation    limited neck mobility  . Esophagitis   . Hernia    Present to proximal to umbillical   . History of hiatal hernia   . Hypertension   . Internal hemorrhoids   . Menorrhagia   . Osteopenia   . PONV (postoperative nausea and vomiting)   . Vertigo   . Vitamin D deficiency     Past Surgical History:  Procedure Laterality Date  . [place] STUDY N/A 11/29/2015   Procedure: [place] STUDY;  Surgeon: Manus Gunning, MD;  Location: [facility];  Service: Gastroenterology;  Laterality: N/A;  . ABDOMINAL HYSTERECTOMY  12/04/2000   also bilateral salpingo-oophorectomy  . CARDIAC CATHETERIZATION  2006  . CARPAL TUNNEL RELEASE  11/20/2011   Procedure: CARPAL TUNNEL RELEASE;  Surgeon: Elaina Hoops, MD;  Location: [place] [facility];  Service: Neurosurgery;  Laterality: Left;  LEFT carpal tunnel release  . CERVICAL SPINE SURGERY  2013   [place]  . DILATION AND CURETTAGE OF UTERUS     11/15/2000  . ESOPHAGEAL MANOMETRY N/A 09/20/2015   Procedure: ESOPHAGEAL MANOMETRY (EM);  Surgeon: Manus Gunning, MD;  Location: [facility];  Service: Gastroenterology;  Laterality: N/A;  . HYSTEROSCOPY  11/15/2000   D & C, resection of endometrial polyps  . OOPHORECTOMY  12/04/2000   bilateral salpingo-oophorectomy done with TAH  . SPINAL CORD STIMULATOR TRIAL N/A 03/22/2020   Procedure: CERVICAL SPINAL CORD  STIMULATOR TRIAL PERCUTANEOUS;  Surgeon: [name], MD;  Location: [facility];  Service: Neurosurgery;  Laterality: N/A;  . TUBAL LIGATION      Family History  Problem Relation Age of Onset  . Hypertension Father   . Prostate cancer Father   . Hypertension Sister   . Diabetes Brother   . Heart disease Brother   . Hypertension Brother   . Diabetes Brother   . Heart disease Brother   . Breast cancer Neg Hx    Social History:  reports that she has never smoked. She has never used smokeless tobacco. She reports that she does not drink alcohol and does not use drugs.  Allergies: No Known Allergies  Medications Prior to Admission  Medication Sig Dispense Refill  . acetaminophen (TYLENOL) 500 MG tablet Take 500-1,000 mg by mouth every 6 (six) hours as needed (for pain).    Marland Kitchen amLODipine (NORVASC) 2.5 MG tablet Take 2.5 mg by mouth daily.    Marland Kitchen atorvastatin (LIPITOR) 10 MG tablet TAKE 1 TABLET ONCE DAILY FOR CHOLESTEROL. (Patient taking differently: Take 10 mg by mouth daily. ) 90 tablet 2  . losartan (COZAAR) 100 MG tablet TAKE 1 TABLET ONCE DAILY FOR BLOOD PRESSURE. (Patient taking differently: Take 100 mg by mouth daily. ) 90 tablet 2  . metoprolol succinate (TOPROL-XL) 50 MG 24 hr tablet TAKE 1 TABLET (50 MG TOTAL) BY MOUTH DAILY.  TAKE WITH OR IMMEDIATELY FOLLOWING A MEAL. 90 tablet 1  . omeprazole (PRILOSEC) 40 MG capsule TAKE 1 CAPSULE (40 MG TOTAL) BY MOUTH DAILY. FOR HEARTBURN. 90 capsule 3  . vitamin B-12 (CYANOCOBALAMIN) 1000 MCG tablet Take 1,000 mcg by mouth daily.    Marland Kitchen aspirin EC 81 MG tablet Take 81 mg by mouth daily. Swallow whole.      No results found for this or any previous visit (from the past 48 hour(s)). No results found.  Review of Systems General ROS: Negative Respiratory ROS: Negative Cardiovascular ROS: Negative Gastrointestinal ROS: Negative Genito-Urinary ROS: Negative Musculoskeletal ROS: Positive for neck pain Neurological ROS: Positive for left arm  pain, numbness Dermatological ROS: Negative   There were no vitals taken for this visit. Physical Exam  General appearance: Alert, cooperative, in no acute distress CV: Regular rate and rhythm Pulm: Clear to auscultation  Neurologic exam:  Mental status: alertness: alert, affect: normal Speech: fluent and clear Motor: Grossly symmetric strength throughout Gait: normal    Assessment/Plan Given successful trial, we will proceed with permanent percutaneous SCS placement.  [name], MD 04/12/2020, 10:14 AM

## 2020-04-12 NOTE — Transfer of Care (Signed)
Immediate Anesthesia Transfer of Care Note  Patient: Valerie [name]  Procedure(s) Performed: INSERTION CERVICAL SPINAL STIMULATOR PULSE GENERATOR (Bilateral )  Patient Location: PACU  Anesthesia Type:General  Level of Consciousness: drowsy and patient cooperative  Airway & Oxygen Therapy: Patient Spontanous Breathing and Patient connected to nasal cannula oxygen  Post-op Assessment: Report given to RN and Post -op Vital signs reviewed and stable  Post vital signs: Reviewed and stable  Last Vitals:  Vitals Value Taken Time  BP 120/81 04/12/20 1312  Temp 36.2 C 04/12/20 1312  Pulse 93 04/12/20 1313  Resp 16 04/12/20 1312  SpO2 98 % 04/12/20 1313  Vitals shown include unvalidated device data.  Last Pain:  Vitals:   04/12/20 1312  TempSrc:   PainSc: (P) 0-No pain      Patients Stated Pain Goal: 0 (03/49/17 9150)  Complications: No complications documented.

## 2020-04-12 NOTE — Progress Notes (Signed)
Pharmacy Antibiotic Note  Valerie [name] is a 69 y.o. female admitted on 04/12/2020 with Thoracic Laminectomy with SCS Paddle Placement and Right flank pulse generator placement.  Pharmacy has been consulted for cefazolin for surgical prophylaxis dosing.  Plan: Cefazolin 2g IV x1 F/u post-op for need to continue antibiotic therapy  Height: 5\' 5"  (165.1 cm) Weight: 66.7 kg (147 lb 0.8 oz) IBW/kg (Calculated) : 57  Temp (24hrs), Avg:97.6 F (36.4 C), Min:97.6 F (36.4 C), Max:97.6 F (36.4 C)   Thank you for allowing pharmacy to be a part of this patient's care.  Brendolyn Patty, PharmD Clinical Pharmacist  04/12/2020   12:53 PM

## 2020-04-12 NOTE — Interval H&P Note (Signed)
History and Physical Interval Note:  04/12/2020 10:14 AM  Valerie [name]  has presented today for surgery, with the diagnosis of chronic pain g89.4.  The various methods of treatment have been discussed with the patient and family. After consideration of risks, benefits and other options for treatment, the patient has consented to  Procedure(s): INSERTION CERVICAL SPINAL STIMULATOR PULSE GENERATOR (Bilateral) as a surgical intervention.  The patient's history has been reviewed, patient examined, no change in status, stable for surgery.  I have reviewed the patient's chart and labs.  Questions were answered to the patient's satisfaction.     [name]

## 2020-04-12 NOTE — Anesthesia Procedure Notes (Signed)
Procedure Name: Intubation Date/Time: 04/12/2020 11:08 AM Performed by: Jonna Clark, CRNA Pre-anesthesia Checklist: Patient identified, Patient being monitored, Timeout performed, Emergency Drugs available and Suction available Patient Re-evaluated:Patient Re-evaluated prior to induction Oxygen Delivery Method: Circle system utilized Preoxygenation: Pre-oxygenation with 100% oxygen Induction Type: IV induction Ventilation: Mask ventilation without difficulty Laryngoscope Size: 3 and McGraph Grade View: Grade I Tube type: Oral Tube size: 7.0 mm Number of attempts: 1 Airway Equipment and Method: Stylet Placement Confirmation: ETT inserted through vocal cords under direct vision,  positive ETCO2 and breath sounds checked- equal and bilateral Secured at: 21 cm Tube secured with: Tape Dental Injury: Teeth and Oropharynx as per pre-operative assessment

## 2020-04-12 NOTE — Progress Notes (Signed)
On hold at this time. Instructed to call back

## 2020-04-12 NOTE — Discharge Instructions (Addendum)
Please hold aspirin for two weeks after surgery.  Your surgeon has performed an operation to place a spinal cord stimulator.  If you do not let your back heal properly from the surgery, you can increase the chance of lead migration and return of your symptoms. The following are instructions to help in your recovery once you have been discharged from the hospital.  * Do not take anti-inflammatory medications for 3 days after surgery (naproxen [Aleve], ibuprofen [Advil, Motrin], celecoxib [Celebrex], etc.)  Activity    No bending, lifting, or twisting ("BLT"). Avoid lifting objects heavier than 10 pounds (gallon milk jug).  Where possible, avoid household activities that involve lifting, bending, pushing, or pulling such as laundry, vacuuming, grocery shopping, and childcare. Try to arrange for help from friends and family for these activities while your back heals.  Increase physical activity slowly as tolerated.  Taking short walks is encouraged, but avoid strenuous exercise. Do not jog, run, bicycle, lift weights, or participate in any other exercises unless specifically allowed by your doctor. Avoid prolonged sitting, including car rides.  Talk to your doctor before resuming sexual activity.  You should not drive until cleared by your doctor.  Until released by your doctor, you should not return to work or school.  You should rest at home and let your body heal.   You may shower two days after your surgery.  After showering, lightly dab your incision dry. Do not take a tub bath or go swimming for 3 weeks, or until approved by your doctor at your follow-up appointment.  If you smoke, we strongly recommend that you quit.  Smoking has been proven to interfere with normal healing in your back and will dramatically reduce the success rate of your surgery. Please contact [facility] (800-QUIT-NOW) and use the resources at www.[facility].com for assistance in stopping smoking.  Surgical  Incision   If you have a dressing on your incision, you may remove it three days after your surgery. Keep your incision area clean and dry.  If you have staples or stitches on your incision, you should have a follow up scheduled for removal. If you do not have staples or stitches, you will have steri-strips (small pieces of surgical tape) or Dermabond glue. The steri-strips/glue should begin to peel away within about a week (it is fine if the steri-strips fall off before then). If the strips are still in place one week after your surgery, you may gently remove them.  Diet            You may return to your usual diet. Be sure to stay hydrated.  When to Contact us  Although your surgery and recovery will likely be uneventful, you may have some residual numbness, aches, and pains in your back and/or legs. This is normal and should improve in the next few weeks.  However, should you experience any of the following, contact us immediately: . New numbness or weakness . Pain that is progressively getting worse, and is not relieved by your pain medications or rest . Bleeding, redness, swelling, pain, or drainage from surgical incision . Chills or flu-like symptoms . Fever greater than 101.0 F (38.3 C) . Problems with bowel or bladder functions . Difficulty breathing or shortness of breath . Warmth, tenderness, or swelling in your calf  Contact Information . During office hours (Monday-Friday 9 am to 5 pm), please call your physician at [phone] . After hours and weekends, please call [phone] and an answering service will put  you in touch with either Dr. Lacinda Axon or Dr. Izora Ribas.  . For a life-threatening emergency, call [place]   1) The drugs that you were given will stay in your system until tomorrow so for the next 24 hours you should not:  A) Drive an automobile B) Make any legal decisions C) Drink any alcoholic beverage   2) You may  resume regular meals tomorrow.  Today it is better to start with liquids and gradually work up to solid foods.  You may eat anything you prefer, but it is better to start with liquids, then soup and crackers, and gradually work up to solid foods.   3) Please notify your doctor immediately if you have any unusual bleeding, trouble breathing, redness and pain at the surgery site, drainage, fever, or pain not relieved by medication.    4) Additional Instructions:        Please contact your physician with any problems or Same Day Surgery at [phone], Monday through Friday 6 am to 4 pm, or [place] at [facility] number at [phone].

## 2020-04-12 NOTE — Anesthesia Preprocedure Evaluation (Signed)
Anesthesia Evaluation  Patient identified by MRN, date of birth, ID band Patient awake    Reviewed: Allergy & Precautions, NPO status , Patient's Chart, lab work & pertinent test results  History of Anesthesia Complications (+) PONV, DIFFICULT AIRWAY and history of anesthetic complications  Airway Mallampati: II  TM Distance: >3 FB Neck ROM: Full    Dental  (+) Upper Dentures, Lower Dentures   Pulmonary neg pulmonary ROS, neg sleep apnea, neg COPD,    breath sounds clear to auscultation- rhonchi (-) wheezing      Cardiovascular hypertension, Pt. on medications (-) CAD, (-) Past MI, (-) Cardiac Stents and (-) CABG  Rhythm:Regular Rate:Normal - Systolic murmurs and - Diastolic murmurs    Neuro/Psych neg Seizures PSYCHIATRIC DISORDERS Anxiety negative neurological ROS     GI/Hepatic Neg liver ROS, hiatal hernia, GERD  ,  Endo/Other  negative endocrine ROSneg diabetes  Renal/GU negative Renal ROS     Musculoskeletal negative musculoskeletal ROS (+)   Abdominal (+) - obese,   Peds  Hematology negative hematology ROS (+)   Anesthesia Other Findings Past Medical History: No date: Acid reflux 12/04/2000: Adenocarcinoma of the endometrium/uterus ([place]) No date: Anal fissure No date: Atrophic vaginitis No date: Difficult intubation     Comment:  limited neck mobility No date: Esophagitis No date: Hernia     Comment:  Present to proximal to umbillical  No date: History of hiatal hernia No date: Hypertension No date: Internal hemorrhoids No date: Menorrhagia No date: Osteopenia No date: PONV (postoperative nausea and vomiting) No date: Vertigo No date: Vitamin D deficiency   Reproductive/Obstetrics                             Anesthesia Physical Anesthesia Plan  ASA: II  Anesthesia Plan: General   Post-op Pain Management:    Induction: Intravenous  PONV Risk Score and Plan: 3 and  TIVA, Ondansetron and Propofol infusion  Airway Management Planned: Oral ETT  Additional Equipment:   Intra-op Plan:   Post-operative Plan: Extubation in OR  Informed Consent: I have reviewed the patients History and Physical, chart, labs and discussed the procedure including the risks, benefits and alternatives for the proposed anesthesia with the patient or authorized representative who has indicated his/her understanding and acceptance.     Dental advisory given  Plan Discussed with: CRNA and Anesthesiologist  Anesthesia Plan Comments:         Anesthesia Quick Evaluation

## 2020-04-12 NOTE — Op Note (Signed)
Operative Note   SURGERY DATE:  04/12/2020   PRE-OP DIAGNOSIS:  Chronic pain syndrome   POST-OP DIAGNOSIS: Post-Op Diagnosis Codes: Chronic pain syndrome   Procedure(s) with comments: Thoracic Laminectomy with SCS Paddle Placement Right flank pulse generator placement  SURGEON:     * Malen Gauze, MD       Marin Olp, PA Assistant   ANESTHESIA: General    OPERATIVE FINDINGS: Successful placement of thoracic spinal cord stimulator   Indication Valerie [name] was seen in clinic on11/2 after successful cervical SCS trial for left armpain where she got 100% relief. MRI of the spine revealed no concerning stenosis at the level of the implant. The patient wished to proceed with permanent SCS for treatment of pain.Risks including weakness, hematoma, infection, failure of pain relief, post-operative pain, stroke, heart attack, pneumonia, and spinal cord injury were discussed.   Procedure  The patient was brought to the operating room where vascular access was obtained andshe was intubated by the anesthesia service. Neuro monitoring electrodes were placed and baseline MEP's and SSEPs were normal. She wasplaced prone on gel rolls. Antibiotics were given.Fluoroscopy was used to confirm planned incision in mid-thoracic area in the midline. An additional incision was planned in theright flank for the pulse generator placement. The patient was prepped and draped in a sterile fashion. A hard time out was performed. Local anesthetic was instilled intoplannedincisionsites.   The midline thoracic incision was opened and taken to the fascia using cautery.Next, a Touhy needle was used to insert in a paramedian approach to enter the interlaminar space at T7/8. Once loss of resistance was identified, this was confirmed with a metal stylette. Next, the percutaneous lead was passed in the rostral direction using fluoroscopy as guidance to keep in the midline. This was passed without resistance  to the level of the C2vertebral body. Lateral views were obtained to confirm we were in the dorsal epidural space. Next, the same procedure was performed contralaterally placing an additional lead in the midline at the C2vertebral body, slightly offset to the previous lead to allow for better coverage.We used the programmerand monitoringto ensure we were getting left arm coverageon monitoring.The leads were secured with anchors in the fascia. The incision was irrigated and hemostasis obtained.   Next, theright flank incision was opened and taken to the fascia and inferior dissection to allow for a large enough pocket for the placement of the battery. The incision was irrigated and hemostasis obtained. Next, a tunneler was used to pass the electrode leads from the thoracic incision to theleft flank. There, the electrodes were attached to the pulse generator and impedances were found to be normal. The pulse generator was placed into the antibiotic pouch and then placed into the incision taking care to place the wires beneath the pulse generator.  A final fluoroscopic image was taken show good placement ofpercutaneous leads.MEPS and SSEPS remained stable throughout the case.Vancomycin powder was placed into the incisions. Next, acombinationof 0,2-0 Vicryls were used to close the incisions with Dermabond on the skin in flank and Nylon in thoracic area  Sterile dressings were applied. The patient was returned to supine positionand the patient was seen to be moving all extremities symmetrically and was taken to PACU for recovery. The family was updated and all questions answered.   ESTIMATED BLOOD LOSS:   10 cc   SPECIMENS None   IMPLANT NEURO LEAD 75CM CPT VE - [id number]  Inventory Item: NEURO LEAD 75CM CPT VE Serial no.:  Model/Cat no.:  [id number]  Implant name: NEURO LEAD 75CM CPT VE - [id number] Laterality: N/A Area: Spine Cervical  Manufacturer: MED[employer] NEUROMOD PAIN  MGMT Date of Manufacture:    Action: Implanted Number Used: 1   Device Identifier:  Device Identifier Type:    NEURO LEAD 75CM CPT VE - [id number]  Inventory Item: NEURO LEAD 75CM CPT VE Serial no.:  Model/Cat no.: [id number]  Implant name: NEURO LEAD 75CM CPT VE - [id number] Laterality: N/A Area: Spine Cervical  Manufacturer: MEDTRONIC NEUROMOD PAIN MGMT Date of Manufacture:    Action: Implanted Number Used: 1   Device Identifier:  Device Identifier Type:    DEVICE Lucillie Garfinkel [id number]  Inventory Item: DEVICE IMPLANT NEUROSTIMINATOR Serial no.: [id number] Model/Cat no.: [id number]  Implant name: DEVICE Lucillie Garfinkel [id number] Laterality: N/A Area: Spine Cervical  Manufacturer: MEDTRONIC NEUROMOD PAIN MGMT Date of Manufacture:    Action: Implanted Number Used: 1   Device Identifier:  Device Identifier Type:    ENVELOPE ABSORB ANTIBACTERIAL - [id number]  Inventory Item: ENVELOPE ABSORB ANTIBACTERIAL Serial no.:  Model/Cat no.: [id number]  Implant name: ENVELOPE ABSORB ANTIBACTERIAL - [id number] Laterality: N/A Area: Spine Cervical  Manufacturer: [employer] [place] [employer] Date of Manufacture:    Action: Implanted Number Used: 1   Device Identifier:  Device Identifier Type:          I performed the case in its entirety with assistance of NP Christine Zdeb   [name], [place]

## 2020-04-12 NOTE — Anesthesia Postprocedure Evaluation (Signed)
Anesthesia Post Note  Patient: Valerie [name]  Procedure(s) Performed: INSERTION CERVICAL SPINAL STIMULATOR PULSE GENERATOR (Bilateral )  Patient location during evaluation: PACU Anesthesia Type: General Level of consciousness: awake and alert and oriented Pain management: pain level controlled Vital Signs Assessment: post-procedure vital signs reviewed and stable Respiratory status: spontaneous breathing, nonlabored ventilation and respiratory function stable Cardiovascular status: blood pressure returned to baseline and stable Postop Assessment: no signs of nausea or vomiting Anesthetic complications: no   No complications documented.   Last Vitals:  Vitals:   04/12/20 1342 04/12/20 1357  BP: 130/75 (P) 125/75  Pulse: 86 (P) 89  Resp: 17 (P) 19  Temp:  (!) (P) 36.3 C  SpO2: 100% (P) 99%    Last Pain:  Vitals:   04/12/20 1357  TempSrc:   PainSc: (P) 3                  Iracema Lanagan

## 2020-04-13 ENCOUNTER — Encounter: Payer: Self-pay | Admitting: Neurosurgery

## 2020-04-13 NOTE — Discharge Summary (Signed)
Procedure: Insertion of cervical spinal cord stimulator Procedure date: 04/12/2020 Diagnosis: chronic pain    History: Valerie [name] is s/p insertion of spinal cord stimulator POD0: Tolerated procedure well. Evaluated in post op recovery still disoriented from anesthesia but able to answer questions and obey commands.   Physical Exam: Vitals:   04/12/20 1427 04/12/20 1448  BP: 126/71 106/71  Pulse: 89 79  Resp: 18 16  Temp: 98 F (36.7 C)   SpO2: 99% 97%    General: Alert and oriented, lying in bed Strength:5/5 throughout  Sensation: intact and symmetric throughout  Skin: surgical dressings clean, dry, intact  Data:  No results for input(s): NA, K, CL, CO2, BUN, CREATININE, LABGLOM, GLUCOSE, CALCIUM in the last 168 hours. No results for input(s): AST, ALT, ALKPHOS in the last 168 hours.  Invalid input(s): TBILI   No results for input(s): WBC, HGB, HCT, PLT in the last 168 hours. No results for input(s): APTT, INR in the last 168 hours.       Assessment/Plan:  Valerie [name] is POD0 s/p cervical spinal cord stimulator placement.   Once she is able to ambulate, tolerate some PO, and urinate, she is cleared for discharge to home.   She will follow up with [place] in two weeks.    Lonell Face, NP Department of Neurosurgery

## 2020-05-26 ENCOUNTER — Other Ambulatory Visit: Payer: Self-pay | Admitting: Primary Care

## 2020-05-26 DIAGNOSIS — E785 Hyperlipidemia, unspecified: Secondary | ICD-10-CM

## 2020-05-26 DIAGNOSIS — I10 Essential (primary) hypertension: Secondary | ICD-10-CM

## 2020-06-07 ENCOUNTER — Ambulatory Visit (INDEPENDENT_AMBULATORY_CARE_PROVIDER_SITE_OTHER): Payer: Medicare HMO | Admitting: Family Medicine

## 2020-06-07 ENCOUNTER — Other Ambulatory Visit: Payer: Self-pay

## 2020-06-07 ENCOUNTER — Encounter: Payer: Self-pay | Admitting: Family Medicine

## 2020-06-07 VITALS — BP 112/80 | HR 105 | Temp 97.4°F | Ht 65.0 in | Wt 145.2 lb

## 2020-06-07 DIAGNOSIS — K219 Gastro-esophageal reflux disease without esophagitis: Secondary | ICD-10-CM | POA: Diagnosis not present

## 2020-06-07 DIAGNOSIS — H6982 Other specified disorders of Eustachian tube, left ear: Secondary | ICD-10-CM | POA: Diagnosis not present

## 2020-06-07 DIAGNOSIS — H6992 Unspecified Eustachian tube disorder, left ear: Secondary | ICD-10-CM

## 2020-06-07 NOTE — Patient Instructions (Addendum)
Aciphex 20 mg, 1 tablet before breakfast (Generic is lansoprazole)   Eustachian Tube Dysfunction: There is a tube that connects between the sinuses and behind the ear called the "eustachian tube." Sometimes when you have allergies, a cold, or nasal congestion for any reason this tube can get blocked and pressure cannot equalize in your ears. (Like if you swim in deep water) This can also trap fluid behind the ear and give you a full, pressure-like sensation that is uncomfortable, but it is not an ear infection.  Recommendations: Afrin Nasal Spray: 2 sprays twice a day for a maximum of 3-4 days (longer than this and your nose gets addicted, and you have rebound swelling that makes it worse.)  Anti-Histamine: Allegra, Zyrtec, or Claritin. All over the counter now and once a day.  Nasal steroid. Nasacort or Flonase / fluticasone is over the counter now.   If you develop fever > 100.4, then things can change fluid behind the ear does increase your risk of developing an ear infection.

## 2020-06-07 NOTE — Progress Notes (Signed)
[name], MD, [place]  Primary Care and Sports Medicine [facility] at [facility] [place], [address]  Phone: [phone]  FAX: [place] - 70 y.o. female  MRN [id number]  Date of Birth: 1951-04-25  Date: 06/07/2020  PCP: Pleas Koch, NP  Referral: Pleas Koch, NP  Chief Complaint  Patient presents with  . Otalgia    Left x 2 weeks  . Gastroesophageal Reflux    This visit occurred during the SARS-CoV-2 public health emergency.  Safety protocols were in place, including screening questions prior to the visit, additional usage of staff PPE, and extensive cleaning of exam room while observing appropriate contact time as indicated for disinfecting solutions.   Subjective:   Valerie [name] is a 70 y.o. very pleasant female patient with Body mass index is 24.17 kg/m. who presents with the following:  She is having some left greater than right ear pain, and she is also had some stuffiness. She has not had any recent illness or injury. She does have some allergies at baseline and she is recently had some eye itching, and she occasionally will have a runny nose off and on. He does not have any significant ear pain in the exterior aspect of the ear. She has not run a fever. She does not have any sinus pressure or pain.  L >> R.  Occ sharp and some dull.  Eyes have been itchy some now.  Runny nose +/-.  GERD. She has had longstanding symptoms and tried all available H2 blockers as well as multiple PPIs. She has reflux very often and to the point where she sometimes will taste it in her back of her throat. Has been on prilosec Protonix Nexium  Pepcid, Zantac  Review of Systems is noted in the HPI, as appropriate  Objective:   BP 112/80   Pulse (!) 105   Temp (!) 97.4 F (36.3 C) (Temporal)   Ht 5\' 5"  (1.651 m)   Wt 145 lb 4 oz (65.9 kg)   SpO2 96%   BMI 24.17 kg/m   GEN: No acute  distress; alert,appropriate. PULM: Breathing comfortably in no respiratory distress PSYCH: Normally interactive.   ENT: There is some increased fluid on the left ear behind the TM, but there landmarks are easily visualized. No tenderness with manipulation of the ear itself at the pinna or tragus. The canal appears to be normal without any swelling or irritation.  CV: RRR, no m/g/r  PULM: Normal respiratory rate, no accessory muscle use. No wheezes, crackles or rhonchi   Laboratory and Imaging Data:  Assessment and Plan:     ICD-10-CM   1. ETD (Eustachian tube dysfunction), left  H69.82   2. Gastroesophageal reflux disease, unspecified whether esophagitis present  K21.9    Trial of different PPI  Patient Instructions  Aciphex 20 mg, 1 tablet before breakfast (Generic is lansoprazole)   Eustachian Tube Dysfunction: There is a tube that connects between the sinuses and behind the ear called the "eustachian tube." Sometimes when you have allergies, a cold, or nasal congestion for any reason this tube can get blocked and pressure cannot equalize in your ears. (Like if you swim in deep water) This can also trap fluid behind the ear and give you a full, pressure-like sensation that is uncomfortable, but it is not an ear infection.  Recommendations: Afrin Nasal Spray: 2 sprays twice a day for a maximum  of 3-4 days (longer than this and your nose gets addicted, and you have rebound swelling that makes it worse.)  Anti-Histamine: Allegra, Zyrtec, or Claritin. All over the counter now and once a day.  Nasal steroid. Nasacort or Flonase / fluticasone is over the counter now.   If you develop fever > 100.4, then things can change fluid behind the ear does increase your risk of developing an ear infection.     Follow-up: No follow-ups on file.  Signed,  Maud Deed. Shakema Surita, MD   Outpatient Encounter Medications as of 06/07/2020  Medication Sig  . acetaminophen (TYLENOL) 500 MG tablet  Take 500-1,000 mg by mouth every 6 (six) hours as needed (for pain).  Marland Kitchen amLODipine (NORVASC) 2.5 MG tablet Take 2.5 mg by mouth daily.  Marland Kitchen atorvastatin (LIPITOR) 10 MG tablet TAKE 1 TABLET ONCE DAILY FOR CHOLESTEROL.  Marland Kitchen losartan (COZAAR) 100 MG tablet TAKE 1 TABLET ONCE DAILY FOR BLOOD PRESSURE.  . methocarbamol (ROBAXIN) 500 MG tablet Take 1 tablet (500 mg total) by mouth every 6 (six) hours as needed for muscle spasms.  . metoprolol succinate (TOPROL-XL) 50 MG 24 hr tablet TAKE 1 TABLET (50 MG TOTAL) BY MOUTH DAILY. TAKE WITH OR IMMEDIATELY FOLLOWING A MEAL.  Marland Kitchen omeprazole (PRILOSEC) 40 MG capsule TAKE 1 CAPSULE (40 MG TOTAL) BY MOUTH DAILY. FOR HEARTBURN.  . vitamin B-12 (CYANOCOBALAMIN) 1000 MCG tablet Take 1,000 mcg by mouth daily.   No facility-administered encounter medications on file as of 06/07/2020.

## 2020-06-29 ENCOUNTER — Encounter: Payer: Self-pay | Admitting: Primary Care

## 2020-06-29 ENCOUNTER — Other Ambulatory Visit: Payer: Self-pay

## 2020-06-29 ENCOUNTER — Ambulatory Visit (INDEPENDENT_AMBULATORY_CARE_PROVIDER_SITE_OTHER): Payer: Medicare HMO | Admitting: Primary Care

## 2020-06-29 VITALS — BP 124/76 | HR 81 | Temp 98.3°F | Ht 65.0 in | Wt 147.0 lb

## 2020-06-29 DIAGNOSIS — F411 Generalized anxiety disorder: Secondary | ICD-10-CM

## 2020-06-29 DIAGNOSIS — E785 Hyperlipidemia, unspecified: Secondary | ICD-10-CM

## 2020-06-29 DIAGNOSIS — E2839 Other primary ovarian failure: Secondary | ICD-10-CM

## 2020-06-29 DIAGNOSIS — I10 Essential (primary) hypertension: Secondary | ICD-10-CM

## 2020-06-29 DIAGNOSIS — Z1211 Encounter for screening for malignant neoplasm of colon: Secondary | ICD-10-CM

## 2020-06-29 DIAGNOSIS — K219 Gastro-esophageal reflux disease without esophagitis: Secondary | ICD-10-CM | POA: Diagnosis not present

## 2020-06-29 DIAGNOSIS — M542 Cervicalgia: Secondary | ICD-10-CM

## 2020-06-29 DIAGNOSIS — M8580 Other specified disorders of bone density and structure, unspecified site: Secondary | ICD-10-CM | POA: Diagnosis not present

## 2020-06-29 DIAGNOSIS — Z1231 Encounter for screening mammogram for malignant neoplasm of breast: Secondary | ICD-10-CM

## 2020-06-29 DIAGNOSIS — Z9889 Other specified postprocedural states: Secondary | ICD-10-CM

## 2020-06-29 DIAGNOSIS — M545 Low back pain, unspecified: Secondary | ICD-10-CM

## 2020-06-29 DIAGNOSIS — C541 Malignant neoplasm of endometrium: Secondary | ICD-10-CM

## 2020-06-29 DIAGNOSIS — E538 Deficiency of other specified B group vitamins: Secondary | ICD-10-CM

## 2020-06-29 DIAGNOSIS — M2540 Effusion, unspecified joint: Secondary | ICD-10-CM

## 2020-06-29 DIAGNOSIS — R7303 Prediabetes: Secondary | ICD-10-CM

## 2020-06-29 DIAGNOSIS — E559 Vitamin D deficiency, unspecified: Secondary | ICD-10-CM

## 2020-06-29 DIAGNOSIS — Z0001 Encounter for general adult medical examination with abnormal findings: Secondary | ICD-10-CM

## 2020-06-29 DIAGNOSIS — Z0000 Encounter for general adult medical examination without abnormal findings: Secondary | ICD-10-CM

## 2020-06-29 DIAGNOSIS — G8929 Other chronic pain: Secondary | ICD-10-CM

## 2020-06-29 LAB — VITAMIN B12: Vitamin B-12: 308 pg/mL (ref 211–911)

## 2020-06-29 LAB — COMPREHENSIVE METABOLIC PANEL
ALT: 16 U/L (ref 0–35)
AST: 21 U/L (ref 0–37)
Albumin: 4.3 g/dL (ref 3.5–5.2)
Alkaline Phosphatase: 55 U/L (ref 39–117)
BUN: 18 mg/dL (ref 6–23)
CO2: 29 mEq/L (ref 19–32)
Calcium: 9.5 mg/dL (ref 8.4–10.5)
Chloride: 105 mEq/L (ref 96–112)
Creatinine, Ser: 0.82 mg/dL (ref 0.40–1.20)
GFR: 73.8 mL/min (ref >60.00)
Glucose, Bld: 100 mg/dL — ABNORMAL HIGH (ref 70–99)
Potassium: 4.2 mEq/L (ref 3.5–5.1)
Sodium: 139 mEq/L (ref 135–145)
Total Bilirubin: 0.8 mg/dL (ref 0.2–1.2)
Total Protein: 7 g/dL (ref 6.0–8.3)

## 2020-06-29 LAB — CBC
HCT: 39.9 % (ref 36.0–46.0)
Hemoglobin: 13.6 g/dL (ref 12.0–15.0)
MCHC: 34.1 g/dL (ref 30.0–36.0)
MCV: 94.7 fl (ref 78.0–100.0)
Platelets: 209 10*3/uL (ref 150.0–400.0)
RBC: 4.22 Mil/uL (ref 3.87–5.11)
RDW: 13.7 % (ref 11.5–15.5)
WBC: 4.1 10*3/uL (ref 4.0–10.5)

## 2020-06-29 LAB — LIPID PANEL
Cholesterol: 167 mg/dL (ref 0–200)
HDL: 42.6 mg/dL (ref >39.00)
LDL Cholesterol: 102 mg/dL — ABNORMAL HIGH (ref 0–99)
NonHDL: 123.95
Total CHOL/HDL Ratio: 4
Triglycerides: 111 mg/dL (ref 0.0–149.0)
VLDL: 22.2 mg/dL (ref 0.0–40.0)

## 2020-06-29 LAB — C-REACTIVE PROTEIN: CRP: <1 mg/dL (ref 0.5–20.0)

## 2020-06-29 LAB — SEDIMENTATION RATE: Sed Rate: 1 mm/hr (ref 0–30)

## 2020-06-29 LAB — HEMOGLOBIN A1C: Hgb A1c MFr Bld: 5.5 % (ref 4.6–6.5)

## 2020-06-29 LAB — VITAMIN D 25 HYDROXY (VIT D DEFICIENCY, FRACTURES): VITD: 27.03 ng/mL — ABNORMAL LOW (ref 30.00–100.00)

## 2020-06-29 MED ORDER — DULOXETINE HCL 20 MG PO CPEP: 20.0000 mg | ORAL_CAPSULE | Freq: Every day | ORAL | 1 refills | Status: DC

## 2020-06-29 NOTE — Progress Notes (Signed)
Subjective:    Patient ID: Valerie [name], female    DOB: Oct 18, 1950, 70 y.o.   MRN: [id number]  HPI  This visit occurred during the SARS-CoV-2 public health emergency.  Safety protocols were in place, including screening questions prior to the visit, additional usage of staff PPE, and extensive cleaning of exam room while observing appropriate contact time as indicated for disinfecting solutions.   Valerie [name] is a 70 year old female who presents today for complete physical.  She would also like to discuss anxiety/panic attacks. Symptoms include feeling nervous, worry, irritable, feeling down/depressed. Symptoms have been chronic for years, more noticeable over the last 6 months. She hasn't been on medication for years, is ready to try.  She has a family history of anxiety.  She continues to struggle with chronic neck and back pain.  Currently following with neurosurgery for chronic neck pain.  She underwent cervical spinal cord stimulator placement in November 2021, last saw her neurosurgeon in mid December 2021 who endorsed she was doing better.  Today she endorses good days and bad days, does notice a difference with the spinal cord stimulator.  Immunizations: -Tetanus: 2017 -Influenza: Completed this season  -Shingles: Completed Zostavax -Pneumonia: Completed at [facility] several years ago.  -Covid-19: Completed three doses  Diet: She endorses a healthy diet.  Exercise: She is active, walking some during warmer weather.   Eye exam: No recent exam Dental exam: Completes semi-annually   Mammogram: August 2021 Dexa: 2019, due this year Colonoscopy: 2011 Hep C Screen: Negative   BP Readings from Last 3 Encounters:  06/29/20 124/76  06/07/20 112/80  04/12/20 106/71   Wt Readings from Last 3 Encounters:  06/29/20 147 lb (66.7 kg)  06/07/20 145 lb 4 oz (65.9 kg)  04/12/20 147 lb 0.8 oz (66.7 kg)     Review of Systems  Constitutional: Negative for unexpected weight change.  HENT:  Negative for rhinorrhea.   Respiratory: Negative for cough and shortness of breath.   Cardiovascular: Negative for chest pain.  Gastrointestinal: Negative for constipation and diarrhea.  Genitourinary: Negative for difficulty urinating and menstrual problem.  Musculoskeletal: Positive for arthralgias, back pain and neck pain.  Skin: Negative for rash.  Allergic/Immunologic: Negative for environmental allergies.  Neurological: Negative for dizziness, numbness and headaches.  Psychiatric/Behavioral: The patient is nervous/anxious.        Past Medical History:  Diagnosis Date  . Acid reflux   . Adenocarcinoma of the endometrium/uterus ([place]) 12/04/2000  . Anal fissure   . Atrophic vaginitis   . Difficult intubation    limited neck mobility  . Esophagitis   . Hernia    Present to proximal to umbillical   . History of hiatal hernia   . Hypertension   . Internal hemorrhoids   . Menorrhagia   . Osteopenia   . PONV (postoperative nausea and vomiting)   . Vertigo   . Vitamin D deficiency      Social History   Socioeconomic History  . Marital status: Married    Spouse name: Not on file  . Number of children: Not on file  . Years of education: Not on file  . Highest education level: Not on file  Occupational History  . Not on file  Tobacco Use  . Smoking status: Never Smoker  . Smokeless tobacco: Never Used  Vaping Use  . Vaping Use: Never used  Substance and Sexual Activity  . Alcohol use: No  . Drug use: Never  . Sexual  activity: Yes    Birth control/protection: Post-menopausal, Surgical  Other Topics Concern  . Not on file  Social History Narrative   Married   Lives in [place]   Has 2 children, 4 grandchildren   Enjoys walking, shopping.   Social Determinants of Health   Financial Resource Strain: Low Risk   . Difficulty of Paying Living Expenses: Not hard at all  Food Insecurity: No Food Insecurity  . Worried About Charity fundraiser in the Last Year:  Never true  . Ran Out of Food in the Last Year: Never true  Transportation Needs: No Transportation Needs  . Lack of Transportation (Medical): No  . Lack of Transportation (Non-Medical): No  Physical Activity: Inactive  . Days of Exercise per Week: 0 days  . Minutes of Exercise per Session: 0 min  Stress: No Stress Concern Present  . Feeling of Stress : Not at all  Social Connections: Not on file  Intimate Partner Violence: Not At Risk  . Fear of Current or Ex-Partner: No  . Emotionally Abused: No  . Physically Abused: No  . Sexually Abused: No    Past Surgical History:  Procedure Laterality Date  . [place] STUDY N/A 11/29/2015   Procedure: [place] STUDY;  Surgeon: Manus Gunning, MD;  Location: [facility];  Service: Gastroenterology;  Laterality: N/A;  . ABDOMINAL HYSTERECTOMY  12/04/2000   also bilateral salpingo-oophorectomy  . CARDIAC CATHETERIZATION  2006  . CARPAL TUNNEL RELEASE  11/20/2011   Procedure: CARPAL TUNNEL RELEASE;  Surgeon: Elaina Hoops, MD;  Location: [facility]O ORS;  Service: Neurosurgery;  Laterality: Left;  LEFT carpal tunnel release  . CERVICAL SPINE SURGERY  2013   [place]  . DILATION AND CURETTAGE OF UTERUS     11/15/2000  . ESOPHAGEAL MANOMETRY N/A 09/20/2015   Procedure: ESOPHAGEAL MANOMETRY (EM);  Surgeon: Manus Gunning, MD;  Location: [facility] END[facility];  Service: Gastroenterology;  Laterality: N/A;  . HYSTEROSCOPY  11/15/2000   D & C, resection of endometrial polyps  . OOPHORECTOMY  12/04/2000   bilateral salpingo-oophorectomy done with TAH  . SPINAL CORD STIMULATOR INSERTION Bilateral 04/12/2020   Procedure: INSERTION CERVICAL SPINAL STIMULATOR PULSE GENERATOR;  Surgeon: [name], MD;  Location: [facility] ORS;  Service: Neurosurgery;  Laterality: Bilateral;  . SPINAL CORD STIMULATOR TRIAL N/A 03/22/2020   Procedure: CERVICAL SPINAL CORD STIMULATOR TRIAL PERCUTANEOUS;  Surgeon: [name], MD;  Location: [facility]  ORS;  Service: Neurosurgery;  Laterality: N/A;  . TUBAL LIGATION      Family History  Problem Relation Age of Onset  . Hypertension Father   . Prostate cancer Father   . Hypertension Sister   . Diabetes Brother   . Heart disease Brother   . Hypertension Brother   . Diabetes Brother   . Heart disease Brother   . Breast cancer Neg Hx     No Known Allergies  Current Outpatient Medications on File Prior to Visit  Medication Sig Dispense Refill  . acetaminophen (TYLENOL) 500 MG tablet Take 500-1,000 mg by mouth every 6 (six) hours as needed (for pain).    Marland Kitchen amLODipine (NORVASC) 2.5 MG tablet Take 2.5 mg by mouth daily.    Marland Kitchen atorvastatin (LIPITOR) 10 MG tablet TAKE 1 TABLET ONCE DAILY FOR CHOLESTEROL. 90 tablet 2  . losartan (COZAAR) 100 MG tablet TAKE 1 TABLET ONCE DAILY FOR BLOOD PRESSURE. 90 tablet 2  . methocarbamol (ROBAXIN) 500 MG tablet Take 1  tablet (500 mg total) by mouth every 6 (six) hours as needed for muscle spasms. 80 tablet 0  . metoprolol succinate (TOPROL-XL) 50 MG 24 hr tablet TAKE 1 TABLET (50 MG TOTAL) BY MOUTH DAILY. TAKE WITH OR IMMEDIATELY FOLLOWING A MEAL. 90 tablet 1  . omeprazole (PRILOSEC) 40 MG capsule TAKE 1 CAPSULE (40 MG TOTAL) BY MOUTH DAILY. FOR HEARTBURN. 90 capsule 3  . vitamin B-12 (CYANOCOBALAMIN) 1000 MCG tablet Take 1,000 mcg by mouth daily.     No current facility-administered medications on file prior to visit.    BP 124/76   Pulse 81   Temp 98.3 F (36.8 C) (Temporal)   Ht 5\' 5"  (1.651 m)   Wt 147 lb (66.7 kg)   SpO2 98%   BMI 24.46 kg/m    Objective:   Physical Exam Constitutional:      Appearance: She is well-nourished.  HENT:     Right Ear: Tympanic membrane and ear canal normal.     Left Ear: Tympanic membrane and ear canal normal.     Mouth/Throat:     Mouth: Oropharynx is clear and moist.  Eyes:     Extraocular Movements: EOM normal.     Pupils: Pupils are equal, round, and reactive to light.  Cardiovascular:     Rate  and Rhythm: Normal rate and regular rhythm.  Pulmonary:     Effort: Pulmonary effort is normal.     Breath sounds: Normal breath sounds.  Abdominal:     General: Bowel sounds are normal.     Palpations: Abdomen is soft.     Tenderness: There is no abdominal tenderness.  Musculoskeletal:        General: Normal range of motion.     Cervical back: Neck supple.  Skin:    General: Skin is warm and dry.  Neurological:     Mental Status: She is alert and oriented to person, place, and time.     Cranial Nerves: No cranial nerve deficit.     Deep Tendon Reflexes:     Reflex Scores:      Patellar reflexes are 2+ on the right side and 2+ on the left side. Psychiatric:        Mood and Affect: Mood and affect and mood normal.            Assessment & Plan:

## 2020-06-29 NOTE — Assessment & Plan Note (Signed)
Immunizations up-to-date. Mammogram up-to-date. Bone density due this summer.  Orders placed. Colonoscopy due, orders placed for GI.  Encouraged healthy diet regular exercise. Exam today as noted Labs pending.

## 2020-06-29 NOTE — Assessment & Plan Note (Signed)
Repeat A1c pending. 

## 2020-06-29 NOTE — Assessment & Plan Note (Signed)
Chronic and continued at times despite omeprazole 40 mg. Discussed to continue omeprazole 40 mg, add famotidine 20 mg.   She will update.

## 2020-06-29 NOTE — Patient Instructions (Signed)
Stop by the lab prior to leaving today. I will notify you of your results once received.   [facility] your mammogram and bone density for August 2022.  Start duloxetine (Cymbalta) 20 mg once daily for anxiety/pain/depression.  Start exercising. You should be getting 150 minutes of moderate intensity exercise weekly.  Continue to work on a healthy diet. Ensure you are consuming 64 ounces of water daily.  You will be contacted regarding your referral to GI for the colonoscopy .  Please let us know if you have not been contacted within two weeks.   Please schedule a follow up visit for 6 weeks for follow up of anxiety/depression.  It was a pleasure to see you today!   Preventive Care 28 Years and Older, Female Preventive care refers to lifestyle choices and visits with your health care provider that can promote health and wellness. This includes:  A yearly physical exam. This is also called an annual wellness visit.  Regular dental and eye exams.  Immunizations.  Screening for certain conditions.  Healthy lifestyle choices, such as: ? Eating a healthy diet. ? Getting regular exercise. ? Not using drugs or products that contain nicotine and tobacco. ? Limiting alcohol use. What can I expect for my preventive care visit? Physical exam Your health care provider will check your:  Height and weight. These may be used to calculate your BMI (body mass index). BMI is a measurement that tells if you are at a healthy weight.  Heart rate and blood pressure.  Body temperature.  Skin for abnormal spots. Counseling Your health care provider may ask you questions about your:  Past medical problems.  Family's medical history.  Alcohol, tobacco, and drug use.  Emotional well-being.  Home life and relationship well-being.  Sexual activity.  Diet, exercise, and sleep habits.  History of falls.  Memory and ability to understand (cognition).  Work and  work Astronomer.  Pregnancy and menstrual history.  Access to firearms. What immunizations do I need? Vaccines are usually given at various ages, according to a schedule. Your health care provider will recommend vaccines for you based on your age, medical history, and lifestyle or other factors, such as travel or where you work.   What tests do I need? Blood tests  Lipid and cholesterol levels. These may be checked every 5 years, or more often depending on your overall health.  Hepatitis C test.  Hepatitis B test. Screening  Lung cancer screening. You may have this screening every year starting at age 77 if you have a 30-pack-year history of smoking and currently smoke or have quit within the past 15 years.  Colorectal cancer screening. ? All adults should have this screening starting at age 53 and continuing until age 31. ? Your health care provider may recommend screening at age 68 if you are at increased risk. ? You will have tests every 1-10 years, depending on your results and the type of screening test.  Diabetes screening. ? This is done by checking your blood sugar (glucose) after you have not eaten for a while (fasting). ? You may have this done every 1-3 years.  Mammogram. ? This may be done every 1-2 years. ? Talk with your health care provider about how often you should have regular mammograms.  Abdominal aortic aneurysm (AAA) screening. You may need this if you are a current or former smoker.  BRCA-related cancer screening. This may be done if you have a family history  of breast, ovarian, tubal, or peritoneal cancers. Other tests  STD (sexually transmitted disease) testing, if you are at risk.  Bone density scan. This is done to screen for osteoporosis. You may have this done starting at age 39. Talk with your health care provider about your test results, treatment options, and if necessary, the need for more tests. Follow these instructions at home: Eating and  drinking  Eat a diet that includes fresh fruits and vegetables, whole grains, lean protein, and low-fat dairy products. Limit your intake of foods with high amounts of sugar, saturated fats, and salt.  Take vitamin and mineral supplements as recommended by your health care provider.  Do not drink alcohol if your health care provider tells you not to drink.  If you drink alcohol: ? Limit how much you have to 0-1 drink a day. ? Be aware of how much alcohol is in your drink. In the [place] one drink equals one 12 oz bottle of beer (355 mL), one 5 oz glass of wine (148 mL), or one 1 oz glass of hard liquor (44 mL).   Lifestyle  Take daily care of your teeth and gums. Brush your teeth every morning and night with fluoride toothpaste. Floss one time each day.  Stay active. Exercise for at least 30 minutes 5 or more days each week.  Do not use any products that contain nicotine or tobacco, such as cigarettes, e-cigarettes, and chewing tobacco. If you need help quitting, ask your health care provider.  Do not use drugs.  If you are sexually active, practice safe sex. Use a condom or other form of protection in order to prevent STIs (sexually transmitted infections).  Talk with your health care provider about taking a low-dose aspirin or statin.  Find healthy ways to cope with stress, such as: ? Meditation, yoga, or listening to music. ? Journaling. ? Talking to a trusted person. ? Spending time with friends and family. Safety  Always wear your seat belt while driving or riding in a vehicle.  Do not drive: ? If you have been drinking alcohol. Do not ride with someone who has been drinking. ? When you are tired or distracted. ? While texting.  Wear a helmet and other protective equipment during sports activities.  If you have firearms in your house, make sure you follow all gun safety procedures. What's next?  Visit your health care provider once a year for an annual wellness  visit.  Ask your health care provider how often you should have your eyes and teeth checked.  Stay up to date on all vaccines. This information is not intended to replace advice given to you by your health care provider. Make sure you discuss any questions you have with your health care provider. Document Revised: 05/05/2020 Document Reviewed: 05/09/2018 Elsevier Patient Education  2021 [employer].

## 2020-06-29 NOTE — Assessment & Plan Note (Signed)
Well controlled in the office today, continue amlodipine 2.5 mg, losartan 100 mg, metoprolol succinate 50 mg. Continue current regimen. CMP pending.

## 2020-06-29 NOTE — Assessment & Plan Note (Signed)
Compliant to atorvastatin 10 mg, repeat lipid panel pending.  

## 2020-06-29 NOTE — Assessment & Plan Note (Signed)
Repeat bone density scan due in August 2022.  Compliant to calcium and vitamin D.

## 2020-06-29 NOTE — Assessment & Plan Note (Signed)
Seems to be doing better since implementation of spinal cord stimulator to cervical spine.  No longer following with neurosurgery.

## 2020-06-29 NOTE — Assessment & Plan Note (Signed)
Previously following with GYN, was released.

## 2020-06-29 NOTE — Assessment & Plan Note (Signed)
Chronic, ongoing. Overall manages on her own.  Given chronic pain coupled with anxiety, will be treating with Cymbalta 20 mg daily.  We will see her back in 6 weeks.

## 2020-06-29 NOTE — Assessment & Plan Note (Signed)
Chronic, more severe over the last 6 months.  We discussed options for treatment, she kindly declines therapy.  She agrees to medication treatment.  Given her chronic pain I recommended we try duloxetine 20 mg daily, she agrees.  Prescription sent to pharmacy.  We discussed possible side effects of headache, GI upset, drowsiness, and SI/HI. If thoughts of SI/HI develop, we discussed to present to the emergency immediately. Patient verbalized understanding.   Follow up in 6 weeks for re-evaluation.

## 2020-06-30 LAB — CYCLIC CITRUL PEPTIDE ANTIBODY, IGG: Cyclic Citrullin Peptide Ab: <16 UNITS

## 2020-06-30 LAB — RHEUMATOID FACTOR: Rheumatoid fact SerPl-aCnc: <14 IU/mL (ref <14)

## 2020-07-05 ENCOUNTER — Telehealth: Payer: Self-pay | Admitting: Primary Care

## 2020-07-05 DIAGNOSIS — H9203 Otalgia, bilateral: Secondary | ICD-10-CM

## 2020-07-05 NOTE — Telephone Encounter (Signed)
Patient has been taking Flonase as well as otc analgesics with no improvement. Has been having Lt>rt ear pain that is sharp and stabbing. Informed we will start referral if she has not received call in 2 weeks to let our office know. She does not want to go to [facility] where would you like her to be sent?

## 2020-07-05 NOTE — Telephone Encounter (Signed)
Referral placed.

## 2020-07-05 NOTE — Telephone Encounter (Signed)
Patient was seen by [name] for a physical on 06/29/20.  Patient told [name] at the appointment that she was having pain in her ear.  Patient said she's still having a lot of pain.  Patient would like to be referred to an ear, nose, and throat specialist. Patient can go to [place] or [place]. She doesn't want to go to [facility].  Patient can go anytime.

## 2020-07-05 NOTE — Telephone Encounter (Signed)
Please remind me, has she tried anything for her ear pain? Nasal sprays? We discussed this briefly during her visit and her ear did not appear to have any abnormality.   Which ear? Yes, okay to refer if she's tried nasal sprays, OTC analgesics.

## 2020-07-05 NOTE — Telephone Encounter (Signed)
Do you need to see patient or ok to do referral?

## 2020-07-07 ENCOUNTER — Other Ambulatory Visit: Payer: Self-pay

## 2020-07-07 ENCOUNTER — Telehealth (INDEPENDENT_AMBULATORY_CARE_PROVIDER_SITE_OTHER): Payer: Self-pay | Admitting: Gastroenterology

## 2020-07-07 DIAGNOSIS — Z1211 Encounter for screening for malignant neoplasm of colon: Secondary | ICD-10-CM

## 2020-07-07 MED ORDER — NA SULFATE-K SULFATE-MG SULF 17.5-3.13-1.6 GM/177ML PO SOLN
1.0000 | Freq: Once | ORAL | 0 refills | Status: AC
Start: 2020-07-07 — End: 2020-07-07

## 2020-07-07 NOTE — Progress Notes (Signed)
Gastroenterology Pre-Procedure Review  Request Date: 07/15/20 Requesting Physician: Dr. Allen Norris  PATIENT REVIEW QUESTIONS: The patient responded to the following health history questions as indicated:    1. Are you having any GI issues? no 2. Do you have a personal history of Polyps? No 3. Do you have a family history of Colon Cancer or Polyps? No 4. Diabetes Mellitus? No 5. Joint replacements in the past 12 months?No 6. Major health problems in the past 3 months?No 7. Any artificial heart valves, MVP, or defibrillator?No    MEDICATIONS & ALLERGIES:    Patient reports the following regarding taking any anticoagulation/antiplatelet therapy:   Plavix, Coumadin, Eliquis, Xarelto, Lovenox, Pradaxa, Brilinta, or Effient? No Aspirin? No  Patient confirms/reports the following medications:  Current Outpatient Medications  Medication Sig Dispense Refill  . acetaminophen (TYLENOL) 500 MG tablet Take 500-1,000 mg by mouth every 6 (six) hours as needed (for pain).    Marland Kitchen amLODipine (NORVASC) 2.5 MG tablet Take 2.5 mg by mouth daily.    Marland Kitchen atorvastatin (LIPITOR) 10 MG tablet TAKE 1 TABLET ONCE DAILY FOR CHOLESTEROL. 90 tablet 2  . DULoxetine (CYMBALTA) 20 MG capsule Take 1 capsule (20 mg total) by mouth daily. For anxiety/depression/pain 30 capsule 1  . losartan (COZAAR) 100 MG tablet TAKE 1 TABLET ONCE DAILY FOR BLOOD PRESSURE. 90 tablet 2  . metoprolol succinate (TOPROL-XL) 50 MG 24 hr tablet TAKE 1 TABLET (50 MG TOTAL) BY MOUTH DAILY. TAKE WITH OR IMMEDIATELY FOLLOWING A MEAL. 90 tablet 1  . Na Sulfate-K Sulfate-Mg Sulf 17.5-3.13-1.6 GM/177ML SOLN Take 1 kit by mouth once for 1 dose. 354 mL 0  . omeprazole (PRILOSEC) 40 MG capsule TAKE 1 CAPSULE (40 MG TOTAL) BY MOUTH DAILY. FOR HEARTBURN. 90 capsule 3  . vitamin B-12 (CYANOCOBALAMIN) 1000 MCG tablet Take 1,000 mcg by mouth daily. (Patient not taking: Reported on 07/07/2020)     No current facility-administered medications for this visit.     Patient confirms/reports the following allergies:  No Known Allergies  No orders of the defined types were placed in this encounter.   AUTHORIZATION INFORMATION Primary Insurance: 1D#: Group #:  Secondary Insurance: 1D#: Group #:  SCHEDULE INFORMATION: Date: 07/15/20 Time: Location:[facility]

## 2020-07-23 ENCOUNTER — Other Ambulatory Visit
Admission: RE | Admit: 2020-07-23 | Discharge: 2020-07-23 | Disposition: A | Discharge status: Home / Self Care | Payer: Medicare HMO | Source: Ambulatory Visit | Attending: Gastroenterology | Admitting: Gastroenterology

## 2020-07-23 ENCOUNTER — Other Ambulatory Visit: Payer: Self-pay

## 2020-07-23 DIAGNOSIS — Z20822 Contact with and (suspected) exposure to covid-19: Secondary | ICD-10-CM | POA: Insufficient documentation

## 2020-07-23 DIAGNOSIS — Z01812 Encounter for preprocedural laboratory examination: Secondary | ICD-10-CM | POA: Insufficient documentation

## 2020-07-23 LAB — SARS CORONAVIRUS 2 (TAT 6-24 HRS): SARS Coronavirus 2: NEGATIVE

## 2020-07-27 ENCOUNTER — Ambulatory Visit
Admission: RE | Admit: 2020-07-27 | Discharge: 2020-07-27 | Disposition: A | Discharge status: Home / Self Care | Payer: Medicare HMO | Attending: Gastroenterology | Admitting: Gastroenterology

## 2020-07-27 ENCOUNTER — Ambulatory Visit: Payer: Medicare HMO

## 2020-07-27 ENCOUNTER — Other Ambulatory Visit: Payer: Self-pay

## 2020-07-27 ENCOUNTER — Encounter: Payer: Self-pay | Admitting: Gastroenterology

## 2020-07-27 ENCOUNTER — Encounter
Admission: RE | Disposition: A | Discharge status: Home / Self Care | Payer: Self-pay | Source: Home / Self Care | Attending: Gastroenterology

## 2020-07-27 DIAGNOSIS — K219 Gastro-esophageal reflux disease without esophagitis: Secondary | ICD-10-CM | POA: Diagnosis not present

## 2020-07-27 DIAGNOSIS — Z1211 Encounter for screening for malignant neoplasm of colon: Secondary | ICD-10-CM | POA: Diagnosis not present

## 2020-07-27 DIAGNOSIS — Z79899 Other long term (current) drug therapy: Secondary | ICD-10-CM | POA: Insufficient documentation

## 2020-07-27 DIAGNOSIS — K449 Diaphragmatic hernia without obstruction or gangrene: Secondary | ICD-10-CM | POA: Diagnosis not present

## 2020-07-27 DIAGNOSIS — K649 Unspecified hemorrhoids: Secondary | ICD-10-CM | POA: Diagnosis not present

## 2020-07-27 DIAGNOSIS — I10 Essential (primary) hypertension: Secondary | ICD-10-CM | POA: Diagnosis not present

## 2020-07-27 DIAGNOSIS — Z8542 Personal history of malignant neoplasm of other parts of uterus: Secondary | ICD-10-CM | POA: Diagnosis not present

## 2020-07-27 DIAGNOSIS — E785 Hyperlipidemia, unspecified: Secondary | ICD-10-CM | POA: Diagnosis not present

## 2020-07-27 DIAGNOSIS — E559 Vitamin D deficiency, unspecified: Secondary | ICD-10-CM | POA: Diagnosis not present

## 2020-07-27 DIAGNOSIS — K640 First degree hemorrhoids: Secondary | ICD-10-CM | POA: Insufficient documentation

## 2020-07-27 HISTORY — DX: Hyperlipidemia, unspecified: E78.5

## 2020-07-27 HISTORY — PX: COLONOSCOPY WITH PROPOFOL: SHX5780

## 2020-07-27 SURGERY — COLONOSCOPY WITH PROPOFOL
Anesthesia: General

## 2020-07-27 MED ORDER — LIDOCAINE HCL (CARDIAC) PF 100 MG/5ML IV SOSY
PREFILLED_SYRINGE | INTRAVENOUS | Status: DC | PRN
  Administered 2020-07-27: 50 mg via INTRAVENOUS

## 2020-07-27 MED ORDER — PROPOFOL 10 MG/ML IV BOLUS
INTRAVENOUS | Status: DC | PRN
  Administered 2020-07-27: 50 mg via INTRAVENOUS

## 2020-07-27 MED ORDER — SODIUM CHLORIDE 0.9 % IV SOLN: INTRAVENOUS | Status: DC

## 2020-07-27 MED ORDER — PHENYLEPHRINE HCL (PRESSORS) 10 MG/ML IV SOLN
INTRAVENOUS | Status: DC | PRN
  Administered 2020-07-27: 100 ug via INTRAVENOUS
  Administered 2020-07-27: 200 ug via INTRAVENOUS
  Administered 2020-07-27 (×2): 100 ug via INTRAVENOUS

## 2020-07-27 MED ORDER — PROPOFOL 500 MG/50ML IV EMUL
INTRAVENOUS | Status: DC | PRN
  Administered 2020-07-27: 150 ug/kg/min via INTRAVENOUS

## 2020-07-27 NOTE — Op Note (Signed)
[facility] Gastroenterology Patient Name: Valerie [name] Procedure Date: 07/27/2020 7:51 AM MRN: [id number] Account #: [id number] Date of Birth: [birth year] Admit Type: Outpatient Age: 70 Room: [facility] ENDO ROOM 4 Gender: Female Note Status: Finalized Procedure:             Colonoscopy Indications:           Screening for colorectal malignant neoplasm Providers:             Lucilla Lame MD, MD Medicines:             Propofol per Anesthesia Complications:         No immediate complications. Procedure:             Pre-Anesthesia Assessment:                        - Prior to the procedure, a History and Physical was                         performed, and patient medications and allergies were                         reviewed. The patient's tolerance of previous                         anesthesia was also reviewed. The risks and benefits                         of the procedure and the sedation options and risks                         were discussed with the patient. All questions were                         answered, and informed consent was obtained. Prior                         Anticoagulants: The patient has taken no previous                         anticoagulant or antiplatelet agents. ASA Grade                         Assessment: II - A patient with mild systemic disease.                         After reviewing the risks and benefits, the patient                         was deemed in satisfactory condition to undergo the                         procedure.                        After obtaining informed consent, the colonoscope was                         passed under direct vision. Throughout the procedure,  the patient's blood pressure, pulse, and oxygen                         saturations were monitored continuously. The                         Colonoscope was introduced through the anus and                         advanced to the the cecum,  identified by appendiceal                         orifice and ileocecal valve. The colonoscopy was                         performed without difficulty. The patient tolerated                         the procedure well. The quality of the bowel                         preparation was excellent. Findings:      The perianal and digital rectal examinations were normal.      Non-bleeding internal hemorrhoids were found during retroflexion. The       hemorrhoids were Grade I (internal hemorrhoids that do not prolapse). Impression:            - Non-bleeding internal hemorrhoids.                        - No specimens collected. Recommendation:        - Discharge patient to home.                        - Resume previous diet.                        - Continue present medications.                        - Repeat colonoscopy in 10 years for surveillance. Procedure Code(s):     --- Professional ---                        [id number], Colonoscopy, flexible; diagnostic, including                         collection of specimen(s) by brushing or washing, when                         performed (separate procedure) Diagnosis Code(s):     --- Professional ---                        Z12.11, Encounter for screening for malignant neoplasm                         of colon CPT copyright 2019 American Medical Association. All rights reserved. The codes documented in this report are preliminary and upon coder review may  be revised to meet current compliance requirements. Lucilla Lame MD, MD 07/27/2020  8:12:46 AM This report has been signed electronically. Number of Addenda: 0 Note Initiated On: 07/27/2020 7:51 AM Scope Withdrawal Time: 0 hours 8 minutes 5 seconds  Total Procedure Duration: 0 hours 11 minutes 45 seconds  Estimated Blood Loss:  Estimated blood loss: none.      [facility]

## 2020-07-27 NOTE — Transfer of Care (Signed)
Immediate Anesthesia Transfer of Care Note  Patient: Valerie [name]  Procedure(s) Performed: COLONOSCOPY WITH PROPOFOL (N/A )  Patient Location: Endoscopy Unit  Anesthesia Type:General  Level of Consciousness: awake and alert   Airway & Oxygen Therapy: Patient Spontanous Breathing and Patient connected to nasal cannula oxygen  Post-op Assessment: Report given to RN and Post -op Vital signs reviewed and stable  Post vital signs: Reviewed and stable  Last Vitals:  Vitals Value Taken Time  BP 88/67 07/27/20 0819  Temp 36.4 C 07/27/20 0816  Pulse 67 07/27/20 0819  Resp 13 07/27/20 0819  SpO2 98 % 07/27/20 0819  Vitals shown include unvalidated device data.  Last Pain:  Vitals:   07/27/20 0816  TempSrc:   PainSc: 0-No pain         Complications: No complications documented.

## 2020-07-27 NOTE — H&P (Signed)
Lucilla Lame, MD [facility] [address] [place], [place] [address] Phone: [phone] Fax : [phone]  Primary Care Physician:  Pleas Koch, NP Primary Gastroenterologist:  Dr. Allen Norris  Pre-Procedure History & Physical: HPI:  Valerie [name] is a 70 y.o. female is here for a screening colonoscopy.   Past Medical History:  Diagnosis Date  . Acid reflux   . Adenocarcinoma of the endometrium/uterus ([place]) 12/04/2000  . Anal fissure   . Atrophic vaginitis   . Difficult intubation    limited neck mobility  . Esophagitis   . Hernia    Present to proximal to umbillical   . History of hiatal hernia   . Hyperlipidemia   . Hypertension   . Internal hemorrhoids   . Menorrhagia   . Osteopenia   . PONV (postoperative nausea and vomiting)   . Vertigo   . Vitamin D deficiency     Past Surgical History:  Procedure Laterality Date  . [place] STUDY N/A 11/29/2015   Procedure: [place] STUDY;  Surgeon: Manus Gunning, MD;  Location: [facility] ENDOSCOPY;  Service: Gastroenterology;  Laterality: N/A;  . ABDOMINAL HYSTERECTOMY  12/04/2000   also bilateral salpingo-oophorectomy  . CARDIAC CATHETERIZATION  2006  . CARPAL TUNNEL RELEASE  11/20/2011   Procedure: CARPAL TUNNEL RELEASE;  Surgeon: Elaina Hoops, MD;  Location: [place] NEURO ORS;  Service: Neurosurgery;  Laterality: Left;  LEFT carpal tunnel release  . CERVICAL SPINE SURGERY  2013   [place]  . DILATION AND CURETTAGE OF UTERUS     11/15/2000  . ESOPHAGEAL MANOMETRY N/A 09/20/2015   Procedure: ESOPHAGEAL MANOMETRY (EM);  Surgeon: Manus Gunning, MD;  Location: [facility] ENDOSCOPY;  Service: Gastroenterology;  Laterality: N/A;  . HYSTEROSCOPY  11/15/2000   D & C, resection of endometrial polyps  . OOPHORECTOMY  12/04/2000   bilateral salpingo-oophorectomy done with TAH  . SPINAL CORD STIMULATOR INSERTION Bilateral 04/12/2020   Procedure: INSERTION CERVICAL SPINAL STIMULATOR PULSE GENERATOR;   Surgeon: [name], MD;  Location: [facility] ORS;  Service: Neurosurgery;  Laterality: Bilateral;  . SPINAL CORD STIMULATOR TRIAL N/A 03/22/2020   Procedure: CERVICAL SPINAL CORD STIMULATOR TRIAL PERCUTANEOUS;  Surgeon: [name], MD;  Location: [facility] ORS;  Service: Neurosurgery;  Laterality: N/A;  . TUBAL LIGATION      Prior to Admission medications   Medication Sig Start Date End Date Taking? Authorizing Provider  acetaminophen (TYLENOL) 500 MG tablet Take 500-1,000 mg by mouth every 6 (six) hours as needed (for pain).   Yes [provider]  amLODipine (NORVASC) 2.5 MG tablet Take 2.5 mg by mouth daily. 02/06/20  Yes [provider]  atorvastatin (LIPITOR) 10 MG tablet TAKE 1 TABLET ONCE DAILY FOR CHOLESTEROL. 10/30/19  Yes Pleas Koch, NP  losartan (COZAAR) 100 MG tablet TAKE 1 TABLET ONCE DAILY FOR BLOOD PRESSURE. 10/30/19  Yes Pleas Koch, NP  metoprolol succinate (TOPROL-XL) 50 MG 24 hr tablet TAKE 1 TABLET (50 MG TOTAL) BY MOUTH DAILY. TAKE WITH OR IMMEDIATELY FOLLOWING A MEAL. 03/15/20  Yes Pleas Koch, NP  omeprazole (PRILOSEC) 40 MG capsule TAKE 1 CAPSULE (40 MG TOTAL) BY MOUTH DAILY. FOR HEARTBURN. 11/12/19  Yes Pleas Koch, NP  DULoxetine (CYMBALTA) 20 MG capsule Take 1 capsule (20 mg total) by mouth daily. For anxiety/depression/pain 06/29/20   Pleas Koch, NP  vitamin B-12 (CYANOCOBALAMIN) 1000 MCG tablet Take 1,000 mcg by mouth daily.    [provider]    Allergies as of 07/07/2020  . (No Known Allergies)    Family History  Problem Relation Age of Onset  . Hypertension Father   . Prostate cancer Father   . Hypertension Sister   . Diabetes Brother   . Heart disease Brother   . Hypertension Brother   . Diabetes Brother   . Heart disease Brother   . Breast cancer Neg Hx     Social History   Socioeconomic History  . Marital status: Married    Spouse name: Not on file  . Number of children: Not on file  . Years  of education: Not on file  . Highest education level: Not on file  Occupational History  . Not on file  Tobacco Use  . Smoking status: Never Smoker  . Smokeless tobacco: Never Used  Vaping Use  . Vaping Use: Never used  Substance and Sexual Activity  . Alcohol use: No  . Drug use: Never  . Sexual activity: Yes    Birth control/protection: Post-menopausal, Surgical  Other Topics Concern  . Not on file  Social History Narrative   Married   Lives in [place]   Has 2 children, 4 grandchildren   Enjoys walking, shopping.   Social Determinants of Health   Financial Resource Strain: Low Risk   . Difficulty of Paying Living Expenses: Not hard at all  Food Insecurity: No Food Insecurity  . Worried About Charity fundraiser in the Last Year: Never true  . Ran Out of Food in the Last Year: Never true  Transportation Needs: No Transportation Needs  . Lack of Transportation (Medical): No  . Lack of Transportation (Non-Medical): No  Physical Activity: Inactive  . Days of Exercise per Week: 0 days  . Minutes of Exercise per Session: 0 min  Stress: No Stress Concern Present  . Feeling of Stress : Not at all  Social Connections: Not on file  Intimate Partner Violence: Not At Risk  . Fear of Current or Ex-Partner: No  . Emotionally Abused: No  . Physically Abused: No  . Sexually Abused: No    Review of Systems: See HPI, otherwise negative ROS  Physical Exam: BP 125/90   Pulse 100   Temp (!) 97 F (36.1 C) (Temporal)   Resp 18   Ht 5\' 5"  (1.651 m)   Wt 64.9 kg   SpO2 100%   BMI 23.80 kg/m  General:   Alert,  pleasant and cooperative in NAD Head:  Normocephalic and atraumatic. Neck:  Supple; no masses or thyromegaly. Lungs:  Clear throughout to auscultation.    Heart:  Regular rate and rhythm. Abdomen:  Soft, nontender and nondistended. Normal bowel sounds, without guarding, and without rebound.   Neurologic:  Alert and  oriented x4;  grossly normal  neurologically.  Impression/Plan: Valerie [name] is now here to undergo a screening colonoscopy.  Risks, benefits, and alternatives regarding colonoscopy have been reviewed with the patient.  Questions have been answered.  All parties agreeable.

## 2020-07-27 NOTE — Anesthesia Preprocedure Evaluation (Signed)
Anesthesia Evaluation  Patient identified by MRN, date of birth, ID band Patient awake    Reviewed: Allergy & Precautions, H&P , NPO status , Patient's Chart, lab work & pertinent test results  History of Anesthesia Complications (+) PONV, DIFFICULT AIRWAY and history of anesthetic complications  Airway Mallampati: III  TM Distance: <3 FB Neck ROM: limited    Dental  (+) Upper Dentures, Lower Dentures   Pulmonary neg pulmonary ROS, neg shortness of breath,    Pulmonary exam normal        Cardiovascular Exercise Tolerance: Good hypertension, Normal cardiovascular exam     Neuro/Psych PSYCHIATRIC DISORDERS Anxiety negative neurological ROS     GI/Hepatic Neg liver ROS, hiatal hernia, GERD  Medicated and Controlled,  Endo/Other  negative endocrine ROS  Renal/GU negative Renal ROS  negative genitourinary   Musculoskeletal   Abdominal   Peds  Hematology negative hematology ROS (+)   Anesthesia Other Findings Past Medical History: No date: Acid reflux 12/04/2000: Adenocarcinoma of the endometrium/uterus ([place]) No date: Anal fissure No date: Atrophic vaginitis No date: Difficult intubation     Comment:  limited neck mobility No date: Esophagitis No date: Hernia     Comment:  Present to proximal to umbillical  No date: History of hiatal hernia No date: Hyperlipidemia No date: Hypertension No date: Internal hemorrhoids No date: Menorrhagia No date: Osteopenia No date: PONV (postoperative nausea and vomiting) No date: Vertigo No date: Vitamin D deficiency  Past Surgical History: 11/29/2015: 24 HOUR [place] STUDY; N/A     Comment:  Procedure: 45 HOUR [place] STUDY;  Surgeon: Manus Gunning, MD;  [facility]: [facility] ENDOS[facility];  Service:               Gastroenterology;  Laterality: N/A; 12/04/2000: ABDOMINAL HYSTERECTOMY     Comment:  also bilateral salpingo-oophorectomy 2006: CARDIAC  CATHETERIZATION 11/20/2011: CARPAL TUNNEL RELEASE     Comment:  Procedure: CARPAL TUNNEL RELEASE;  Surgeon: Elaina Hoops,              MD;  Location: [place] NEURO ORS;  Service: Neurosurgery;                Laterality: Left;  LEFT carpal tunnel release 2013: CERVICAL SPINE SURGERY     Comment:  [place]: CHOLECYSTECTOMY No date: DILATION AND CURETTAGE OF UTERUS     Comment:  11/15/2000 09/20/2015: ESOPHAGEAL MANOMETRY; N/A     Comment:  Procedure: ESOPHAGEAL MANOMETRY (EM);  Surgeon: Manus Gunning, MD;  [facility]: [facility];  Service:               Gastroenterology;  Laterality: N/A; 11/15/2000: HYSTEROSCOPY     Comment:  D & C, resection of endometrial polyps 12/04/2000: OOPHORECTOMY     Comment:  bilateral salpingo-oophorectomy done with TAH 04/12/2020: SPINAL CORD STIMULATOR INSERTION; Bilateral     Comment:  Procedure: INSERTION CERVICAL SPINAL STIMULATOR PULSE               GENERATOR;  Surgeon: [name], MD;  Location: [facility]               ORS;  Service: Neurosurgery;  Laterality: Bilateral; 03/22/2020: SPINAL CORD STIMULATOR TRIAL; N/A     Comment:  Procedure: CERVICAL SPINAL CORD STIMULATOR TRIAL  PERCUTANEOUS;  Surgeon: [name], MD;  Location: [facility]              ORS;  Service: Neurosurgery;  Laterality: N/A; No date: TUBAL LIGATION     Reproductive/Obstetrics negative OB ROS                             Anesthesia Physical Anesthesia Plan  ASA: III  Anesthesia Plan: General   Post-op Pain Management:    Induction: Intravenous  PONV Risk Score and Plan: Propofol infusion and TIVA  Airway Management Planned: Natural Airway and Nasal Cannula  Additional Equipment:   Intra-op Plan:   Post-operative Plan:   Informed Consent: I have reviewed the patients History and Physical, chart, labs and discussed the procedure including the risks, benefits and alternatives for the proposed anesthesia with the  patient or authorized representative who has indicated his/her understanding and acceptance.     Dental Advisory Given  Plan Discussed with: Anesthesiologist, CRNA and Surgeon  Anesthesia Plan Comments: (Patient consented for risks of anesthesia including but not limited to:  - adverse reactions to medications - risk of airway placement if required - damage to eyes, teeth, lips or other oral mucosa - nerve damage due to positioning  - sore throat or hoarseness - Damage to heart, brain, nerves, lungs, other parts of body or loss of life  Patient voiced understanding.)        Anesthesia Quick Evaluation

## 2020-07-27 NOTE — Anesthesia Postprocedure Evaluation (Signed)
Anesthesia Post Note  Patient: Valerie [name]  Procedure(s) Performed: COLONOSCOPY WITH PROPOFOL (N/A )  Patient location during evaluation: Endoscopy Anesthesia Type: General Level of consciousness: awake and alert Pain management: pain level controlled Vital Signs Assessment: post-procedure vital signs reviewed and stable Respiratory status: spontaneous breathing, nonlabored ventilation, respiratory function stable and patient connected to nasal cannula oxygen Cardiovascular status: blood pressure returned to baseline and stable Postop Assessment: no apparent nausea or vomiting Anesthetic complications: no   No complications documented.   Last Vitals:  Vitals:   07/27/20 0826 07/27/20 0836  BP: (!) 85/47 104/63  Pulse: 67 64  Resp: 14 18  Temp:    SpO2: 99% 100%    Last Pain:  Vitals:   07/27/20 0836  TempSrc:   PainSc: 0-No pain                 Precious Haws [facility]une [facility]

## 2020-07-29 ENCOUNTER — Encounter (INDEPENDENT_AMBULATORY_CARE_PROVIDER_SITE_OTHER): Payer: Self-pay | Admitting: Otolaryngology

## 2020-07-29 ENCOUNTER — Other Ambulatory Visit: Payer: Self-pay

## 2020-07-29 ENCOUNTER — Ambulatory Visit (INDEPENDENT_AMBULATORY_CARE_PROVIDER_SITE_OTHER): Payer: Medicare HMO | Admitting: Otolaryngology

## 2020-07-29 VITALS — Temp 97.7°F

## 2020-07-29 DIAGNOSIS — H9203 Otalgia, bilateral: Secondary | ICD-10-CM | POA: Diagnosis not present

## 2020-07-29 DIAGNOSIS — M26609 Unspecified temporomandibular joint disorder, unspecified side: Secondary | ICD-10-CM | POA: Diagnosis not present

## 2020-07-29 NOTE — Progress Notes (Signed)
HPI: Valerie [name] is a 70 y.o. female who presents is referred by her PCP for evaluation of intermittent pain in the left ear that comes and goes.  It is worse sometimes when she lies on it.  The last time she had pain was a couple of days ago and it lasted for couple of hours.  She has not noted any change in her hearing and no hearing problems.  She was told that she might have fluid in the ear..  Past Medical History:  Diagnosis Date  . Acid reflux   . Adenocarcinoma of the endometrium/uterus ([place]) 12/04/2000  . Anal fissure   . Atrophic vaginitis   . Difficult intubation    limited neck mobility  . Esophagitis   . Hernia    Present to proximal to umbillical   . History of hiatal hernia   . Hyperlipidemia   . Hypertension   . Internal hemorrhoids   . Menorrhagia   . Osteopenia   . PONV (postoperative nausea and vomiting)   . Vertigo   . Vitamin D deficiency    Past Surgical History:  Procedure Laterality Date  . [place] STUDY N/A 11/29/2015   Procedure: [place] STUDY;  Surgeon: Manus Gunning, MD;  Location: [facility];  Service: Gastroenterology;  Laterality: N/A;  . ABDOMINAL HYSTERECTOMY  12/04/2000   also bilateral salpingo-oophorectomy  . CARDIAC CATHETERIZATION  2006  . CARPAL TUNNEL RELEASE  11/20/2011   Procedure: CARPAL TUNNEL RELEASE;  Surgeon: Elaina Hoops, MD;  Location: [facility];  Service: Neurosurgery;  Laterality: Left;  LEFT carpal tunnel release  . CERVICAL SPINE SURGERY  2013   [place]  . DILATION AND CURETTAGE OF UTERUS     11/15/2000  . ESOPHAGEAL MANOMETRY N/A 09/20/2015   Procedure: ESOPHAGEAL MANOMETRY (EM);  Surgeon: Manus Gunning, MD;  Location: [facility];  Service: Gastroenterology;  Laterality: N/A;  . HYSTEROSCOPY  11/15/2000   D & C, resection of endometrial polyps  . OOPHORECTOMY  12/04/2000   bilateral salpingo-oophorectomy done with TAH  . SPINAL CORD STIMULATOR INSERTION Bilateral  04/12/2020   Procedure: INSERTION CERVICAL SPINAL STIMULATOR PULSE GENERATOR;  Surgeon: [name], MD;  Location: [facility];  Service: Neurosurgery;  Laterality: Bilateral;  . SPINAL CORD STIMULATOR TRIAL N/A 03/22/2020   Procedure: CERVICAL SPINAL CORD STIMULATOR TRIAL PERCUTANEOUS;  Surgeon: [name], MD;  Location: [facility];  Service: Neurosurgery;  Laterality: N/A;  . TUBAL LIGATION     Social History   Socioeconomic History  . Marital status: Married    Spouse name: Not on file  . Number of children: Not on file  . Years of education: Not on file  . Highest education level: Not on file  Occupational History  . Not on file  Tobacco Use  . Smoking status: Never Smoker  . Smokeless tobacco: Never Used  Vaping Use  . Vaping Use: Never used  Substance and Sexual Activity  . Alcohol use: No  . Drug use: Never  . Sexual activity: Yes    Birth control/protection: Post-menopausal, Surgical  Other Topics Concern  . Not on file  Social History Narrative   Married   Lives in [place]   Has 2 children, 4 grandchildren   Enjoys walking, shopping.   Social Determinants of Health   Financial Resource Strain: Low Risk   . Difficulty of Paying Living Expenses: Not hard at all  Food Insecurity: No Food Insecurity  .  Worried About Charity fundraiser in the Last Year: Never true  . Ran Out of Food in the Last Year: Never true  Transportation Needs: No Transportation Needs  . Lack of Transportation (Medical): No  . Lack of Transportation (Non-Medical): No  Physical Activity: Inactive  . Days of Exercise per Week: 0 days  . Minutes of Exercise per Session: 0 min  Stress: No Stress Concern Present  . Feeling of Stress : Not at all  Social Connections: Not on file   Family History  Problem Relation Age of Onset  . Hypertension Father   . Prostate cancer Father   . Hypertension Sister   . Diabetes Brother   . Heart disease Brother   . Hypertension Brother   . Diabetes  Brother   . Heart disease Brother   . Breast cancer Neg Hx    No Known Allergies Prior to Admission medications   Medication Sig Start Date End Date Taking? Authorizing Provider  acetaminophen (TYLENOL) 500 MG tablet Take 500-1,000 mg by mouth every 6 (six) hours as needed (for pain).    [provider]  amLODipine (NORVASC) 2.5 MG tablet Take 2.5 mg by mouth daily. 02/06/20   [provider]  atorvastatin (LIPITOR) 10 MG tablet TAKE 1 TABLET ONCE DAILY FOR CHOLESTEROL. 10/30/19   Pleas Koch, NP  DULoxetine (CYMBALTA) 20 MG capsule Take 1 capsule (20 mg total) by mouth daily. For anxiety/depression/pain 06/29/20   Pleas Koch, NP  losartan (COZAAR) 100 MG tablet TAKE 1 TABLET ONCE DAILY FOR BLOOD PRESSURE. 10/30/19   Pleas Koch, NP  metoprolol succinate (TOPROL-XL) 50 MG 24 hr tablet TAKE 1 TABLET (50 MG TOTAL) BY MOUTH DAILY. TAKE WITH OR IMMEDIATELY FOLLOWING A MEAL. 03/15/20   Pleas Koch, NP  omeprazole (PRILOSEC) 40 MG capsule TAKE 1 CAPSULE (40 MG TOTAL) BY MOUTH DAILY. FOR HEARTBURN. 11/12/19   Pleas Koch, NP  vitamin B-12 (CYANOCOBALAMIN) 1000 MCG tablet Take 1,000 mcg by mouth daily.    [provider]     Positive ROS: Otherwise negative  All other systems have been reviewed and were otherwise negative with the exception of those mentioned in the HPI and as above.  Physical Exam: Constitutional: Alert, well-appearing, no acute distress Ears: External ears without lesions or tenderness. Ear canals are clear bilaterally.  Both TMs are clear bilaterally.  With no middle ear fluid or signs of infection.  On hearing screening with the 512 1024 tuning fork AC was greater than BC bilaterally.  Subjectively she had a mild sensorineural hearing loss in both ears which was symmetric using the 1024 tuning fork. Nasal: External nose without lesions. Septum midline..  Both middle meatus regions are clear with no signs of infection. Oral:  Lips and gums without lesions. Tongue and palate mucosa without lesions. Posterior oropharynx clear.  Tonsil regions are benign bilaterally. Neck: No palpable adenopathy or masses.  No pain of the auricle or moving the auricle.  On examination of the TMJ area she has TMJ dysfunction with slight TMJ pain on the left side. Respiratory: Breathing comfortably  Skin: No facial/neck lesions or rash noted.  Procedures  Assessment: Left ear pain most likely related to TMJ dysfunction.  As she has normal ear examination bilaterally.  Plan: When she is having pain or discomfort in the ear recommended a soft diet and use of NSAIDs to help with the pain.  Reviewed with the patient today concerning TMJ dysfunction causing her ear  pain.  Reassured her of normal ear exam otherwise although she does have a mild upper frequency sensorineural hearing loss.   Radene Journey, MD   CC:

## 2020-07-30 DIAGNOSIS — R002 Palpitations: Secondary | ICD-10-CM | POA: Diagnosis not present

## 2020-07-30 DIAGNOSIS — R0789 Other chest pain: Secondary | ICD-10-CM | POA: Diagnosis not present

## 2020-07-30 DIAGNOSIS — E782 Mixed hyperlipidemia: Secondary | ICD-10-CM | POA: Diagnosis not present

## 2020-07-30 DIAGNOSIS — I10 Essential (primary) hypertension: Secondary | ICD-10-CM | POA: Diagnosis not present

## 2020-08-12 ENCOUNTER — Other Ambulatory Visit: Payer: Self-pay | Admitting: Primary Care

## 2020-08-12 DIAGNOSIS — I10 Essential (primary) hypertension: Secondary | ICD-10-CM

## 2020-08-13 ENCOUNTER — Ambulatory Visit (INDEPENDENT_AMBULATORY_CARE_PROVIDER_SITE_OTHER)
Admission: RE | Admit: 2020-08-13 | Discharge: 2020-08-13 | Disposition: A | Discharge status: Home / Self Care | Payer: Medicare HMO | Source: Ambulatory Visit | Attending: Primary Care | Admitting: Primary Care

## 2020-08-13 ENCOUNTER — Ambulatory Visit (INDEPENDENT_AMBULATORY_CARE_PROVIDER_SITE_OTHER): Payer: Medicare HMO | Admitting: Primary Care

## 2020-08-13 ENCOUNTER — Encounter: Payer: Self-pay | Admitting: Primary Care

## 2020-08-13 ENCOUNTER — Other Ambulatory Visit: Payer: Self-pay

## 2020-08-13 VITALS — BP 120/82 | HR 76 | Temp 97.6°F | Ht 65.0 in | Wt 148.0 lb

## 2020-08-13 DIAGNOSIS — R300 Dysuria: Secondary | ICD-10-CM | POA: Diagnosis not present

## 2020-08-13 DIAGNOSIS — R1084 Generalized abdominal pain: Secondary | ICD-10-CM

## 2020-08-13 DIAGNOSIS — K5909 Other constipation: Secondary | ICD-10-CM | POA: Insufficient documentation

## 2020-08-13 DIAGNOSIS — K219 Gastro-esophageal reflux disease without esophagitis: Secondary | ICD-10-CM | POA: Diagnosis not present

## 2020-08-13 DIAGNOSIS — R109 Unspecified abdominal pain: Secondary | ICD-10-CM | POA: Diagnosis not present

## 2020-08-13 HISTORY — DX: Dysuria: R30.0

## 2020-08-13 LAB — POC URINALSYSI DIPSTICK (AUTOMATED)
Bilirubin, UA: NEGATIVE
Glucose, UA: NEGATIVE
Ketones, UA: NEGATIVE
Nitrite, UA: NEGATIVE
Protein, UA: NEGATIVE
Spec Grav, UA: 1.01 (ref 1.010–1.025)
Urobilinogen, UA: 0.2 E.U./dL
pH, UA: 5.5 (ref 5.0–8.0)

## 2020-08-13 NOTE — Assessment & Plan Note (Signed)
Bowel movements typically every 7 days, smaller/firm movement this morning.  Given generalized abdominal tenderness on exam, will obtain abdominal plain films today.

## 2020-08-13 NOTE — Assessment & Plan Note (Signed)
Chronic and continued despite omeprazole 40 mg. Discussed to add in famotidine 20 mg in AM. She will update.

## 2020-08-13 NOTE — Progress Notes (Signed)
Subjective:    Patient ID: Valerie [name], female    DOB: Dec 31, 1950, 70 y.o.   MRN: [id number]  HPI  Valerie [name] is a very pleasant 70 y.o. female with a history of hypertension, hemorrhoids, hematuria, GERD, cystitis, endometrial cancer, hiatal hernia, constipation, vulvar dermatitis who presents today with a chief complaint of dysuria.   She also reports urinary frequency, foul odor to urine, hematuria, nausea, epigastric pain, esophageal reflux. Symptoms began about three weeks ago. Bowel movements are typically every 7 days, last one was a very small movement this morning.   Her epigastric pain has woken her from sleep recently.  She denies vomiting, bloody stools. She is compliant to her omeprazole 40 mg, continues to notice break through symptoms of esophageal burning and throat fullness despite compliance.   She underwent colonoscopy a few weeks ago, internal hemorrhoids, otherwise negative.   BP Readings from Last 3 Encounters:  08/13/20 120/82  07/27/20 104/63  06/29/20 124/76     Review of Systems  Gastrointestinal: Positive for constipation and nausea. Negative for blood in stool and vomiting.  Genitourinary: Positive for dysuria, frequency and hematuria. Negative for vaginal discharge.         Past Medical History:  Diagnosis Date  . Acid reflux   . Adenocarcinoma of the endometrium/uterus ([place] 12/04/2000  . Anal fissure   . Atrophic vaginitis   . Difficult intubation    limited neck mobility  . Esophagitis   . Hernia    Present to proximal to umbillical   . History of hiatal hernia   . Hyperlipidemia   . Hypertension   . Internal hemorrhoids   . Menorrhagia   . Osteopenia   . PONV (postoperative nausea and vomiting)   . Vertigo   . Vitamin D deficiency     Social History   Socioeconomic History  . Marital status: Married    Spouse name: Not on file  . Number of children: Not on file  . Years of education: Not on file  . Highest education level:  Not on file  Occupational History  . Not on file  Tobacco Use  . Smoking status: Never Smoker  . Smokeless tobacco: Never Used  Vaping Use  . Vaping Use: Never used  Substance and Sexual Activity  . Alcohol use: No  . Drug use: Never  . Sexual activity: Yes    Birth control/protection: Post-menopausal, Surgical  Other Topics Concern  . Not on file  Social History Narrative   Married   Lives in [place]   Has 2 children, 4 grandchildren   Enjoys walking, shopping.   Social Determinants of Health   Financial Resource Strain: Low Risk   . Difficulty of Paying Living Expenses: Not hard at all  Food Insecurity: No Food Insecurity  . Worried About Charity fundraiser in the Last Year: Never true  . Ran Out of Food in the Last Year: Never true  Transportation Needs: No Transportation Needs  . Lack of Transportation (Medical): No  . Lack of Transportation (Non-Medical): No  Physical Activity: Inactive  . Days of Exercise per Week: 0 days  . Minutes of Exercise per Session: 0 min  Stress: No Stress Concern Present  . Feeling of Stress : Not at all  Social Connections: Not on file  Intimate Partner Violence: Not At Risk  . Fear of Current or Ex-Partner: No  . Emotionally Abused: No  . Physically Abused: No  . Sexually Abused: No  Past Surgical History:  Procedure Laterality Date  . [place] STUDY N/A 11/29/2015   Procedure: [place] STUDY;  Surgeon: Manus Gunning, MD;  [facility]: [facility];  Service: Gastroenterology;  Laterality: N/A;  . ABDOMINAL HYSTERECTOMY  12/04/2000   also bilateral salpingo-oophorectomy  . CARDIAC CATHETERIZATION  2006  . CARPAL TUNNEL RELEASE  11/20/2011   Procedure: CARPAL TUNNEL RELEASE;  Surgeon: Elaina Hoops, MD;  Location: [facility];  Service: Neurosurgery;  Laterality: Left;  LEFT carpal tunnel release  . CERVICAL SPINE SURGERY  2013   [place]  . COLONOSCOPY WITH PROPOFOL N/A 07/27/2020   Procedure:  COLONOSCOPY WITH PROPOFOL;  Surgeon: Lucilla Lame, MD;  Location: [facility];  Service: Endoscopy;  Laterality: N/A;  . DILATION AND CURETTAGE OF UTERUS     11/15/2000  . ESOPHAGEAL MANOMETRY N/A 09/20/2015   Procedure: ESOPHAGEAL MANOMETRY (EM);  Surgeon: Manus Gunning, MD;  Location: [facility];  Service: Gastroenterology;  Laterality: N/A;  . HYSTEROSCOPY  11/15/2000   D & C, resection of endometrial polyps  . OOPHORECTOMY  12/04/2000   bilateral salpingo-oophorectomy done with TAH  . SPINAL CORD STIMULATOR INSERTION Bilateral 04/12/2020   Procedure: INSERTION CERVICAL SPINAL STIMULATOR PULSE GENERATOR;  Surgeon: [name], MD;  Location: [facility];  Service: Neurosurgery;  Laterality: Bilateral;  . SPINAL CORD STIMULATOR TRIAL N/A 03/22/2020   Procedure: CERVICAL SPINAL CORD STIMULATOR TRIAL PERCUTANEOUS;  Surgeon: [name], MD;  Location: [facility];  Service: Neurosurgery;  Laterality: N/A;  . TUBAL LIGATION      Family History  Problem Relation Age of Onset  . Hypertension Father   . Prostate cancer Father   . Hypertension Sister   . Diabetes Brother   . Heart disease Brother   . Hypertension Brother   . Diabetes Brother   . Heart disease Brother   . Breast cancer Neg Hx     No Known Allergies  Current Outpatient Medications on File Prior to Visit  Medication Sig Dispense Refill  . acetaminophen (TYLENOL) 500 MG tablet Take 500-1,000 mg by mouth every 6 (six) hours as needed (for pain).    Marland Kitchen amLODipine (NORVASC) 2.5 MG tablet Take 2.5 mg by mouth daily.    Marland Kitchen atorvastatin (LIPITOR) 10 MG tablet TAKE 1 TABLET ONCE DAILY FOR CHOLESTEROL. 90 tablet 2  . DULoxetine (CYMBALTA) 20 MG capsule Take 1 capsule (20 mg total) by mouth daily. For anxiety/depression/pain 30 capsule 1  . losartan (COZAAR) 100 MG tablet TAKE 1 TABLET ONCE DAILY FOR BLOOD PRESSURE. 90 tablet 2  . metoprolol succinate (TOPROL-XL) 50 MG 24 hr tablet Take 1 tablet (50 mg total) by mouth  daily. For heart rate/palpitations. 90 tablet 3  . omeprazole (PRILOSEC) 40 MG capsule TAKE 1 CAPSULE (40 MG TOTAL) BY MOUTH DAILY. FOR HEARTBURN. 90 capsule 3  . vitamin B-12 (CYANOCOBALAMIN) 1000 MCG tablet Take 1,000 mcg by mouth daily.     No current facility-administered medications on file prior to visit.    BP 120/82   Pulse 76   Temp 97.6 F (36.4 C) (Temporal)   Ht 5\' 5"  (1.651 m)   Wt 148 lb (67.1 kg)   SpO2 97%   BMI 24.63 kg/m  Objective:   Physical Exam Cardiovascular:     Rate and Rhythm: Normal rate and regular rhythm.  Pulmonary:     Effort: Pulmonary effort is normal.     Breath sounds: Normal breath sounds.  Abdominal:  General: Abdomen is flat.     Palpations: Abdomen is soft.     Tenderness: There is generalized abdominal tenderness. There is no guarding.  Musculoskeletal:     Cervical back: Neck supple.  Skin:    General: Skin is warm and dry.           Assessment & Plan:      This visit occurred during the SARS-CoV-2 public health emergency.  Safety protocols were in place, including screening questions prior to the visit, additional usage of staff PPE, and extensive cleaning of exam room while observing appropriate contact time as indicated for disinfecting solutions.

## 2020-08-13 NOTE — Assessment & Plan Note (Signed)
Acute for a few weeks.   UA today with trace leuks, 3+ blood. Culture sent.   Hematuria noted on numerous prior UA's.  If culture negative, then will need to work this up, especially given her endometrial cancer history.   Will repeat UA post antibiotic treatment if culture is positive.

## 2020-08-13 NOTE — Patient Instructions (Signed)
Complete xray(s) prior to leaving today. I will notify you of your results once received.  Add in famotidine 20 mg once daily for heartburn.   Ensure you are consuming 64 ounces of water daily.  I will be in touch regarding your urine test results.   It was a pleasure to see you today!

## 2020-08-15 ENCOUNTER — Other Ambulatory Visit: Payer: Self-pay | Admitting: Primary Care

## 2020-08-15 DIAGNOSIS — N3001 Acute cystitis with hematuria: Secondary | ICD-10-CM

## 2020-08-15 LAB — URINE CULTURE
MICRO NUMBER:: 11665145
SPECIMEN QUALITY:: ADEQUATE

## 2020-08-15 MED ORDER — SULFAMETHOXAZOLE-TRIMETHOPRIM 800-160 MG PO TABS: 1.0000 | ORAL_TABLET | Freq: Two times a day (BID) | ORAL | 0 refills | Status: DC

## 2020-08-18 ENCOUNTER — Telehealth: Payer: Self-pay

## 2020-08-18 DIAGNOSIS — F411 Generalized anxiety disorder: Secondary | ICD-10-CM

## 2020-08-18 NOTE — Telephone Encounter (Signed)
[facility] Night - Client Nonclinical Telephone Record [facility]urse Client [facility] Night - Client Client Site [place] Physician Alma Friendly - NP Contact Type Call Who Is Calling Patient / Member / Family / Caregiver Caller Name Leland Johns Phone Number [phone] Patient Name Aniyha Tate Patient DOB 08-Jun-1950 Call Type Message Only Information Provided Reason for Call Request for General Office Information Initial Comment Caller Lattie Haw from [facility] ph# [phone] calling to f/u on a new rx. Pt is Linzy Laury DOB 1950-06-05. Rx is for Duloxatin 20 mg capsules. Caller declined to speak to physician says she only needs to follow up and send a message if possible. Disp. Time Disposition Final User 08/17/2020 5:18:15 PM General Information Provided Yes Jearld Adjutant, Denice Call Closed By: Samule Ohm Transaction Date/Time: 08/17/2020 5:13:44 PM (ET)

## 2020-08-19 MED ORDER — DULOXETINE HCL 20 MG PO CPEP: 20.0000 mg | ORAL_CAPSULE | Freq: Every day | ORAL | 3 refills | Status: DC

## 2020-08-19 NOTE — Telephone Encounter (Signed)
Called pharmacy needs refill on Cymbalta sent in for 90 day ok to send in refill?

## 2020-08-19 NOTE — Telephone Encounter (Signed)
Refill sent to pharmacy.   

## 2020-08-23 ENCOUNTER — Other Ambulatory Visit: Payer: Self-pay | Admitting: Primary Care

## 2020-08-23 DIAGNOSIS — K219 Gastro-esophageal reflux disease without esophagitis: Secondary | ICD-10-CM

## 2020-09-27 DIAGNOSIS — Z20822 Contact with and (suspected) exposure to covid-19: Secondary | ICD-10-CM | POA: Diagnosis not present

## 2020-09-28 ENCOUNTER — Telehealth (INDEPENDENT_AMBULATORY_CARE_PROVIDER_SITE_OTHER): Payer: Medicare HMO | Admitting: Family Medicine

## 2020-09-28 ENCOUNTER — Encounter: Payer: Self-pay | Admitting: Family Medicine

## 2020-09-28 ENCOUNTER — Telehealth: Payer: Self-pay

## 2020-09-28 VITALS — BP 148/85 | HR 87 | Wt 147.0 lb

## 2020-09-28 DIAGNOSIS — U071 COVID-19: Secondary | ICD-10-CM

## 2020-09-28 HISTORY — DX: COVID-19: U07.1

## 2020-09-28 MED ORDER — BENZONATATE 100 MG PO CAPS: 100.0000 mg | ORAL_CAPSULE | Freq: Three times a day (TID) | ORAL | 0 refills | Status: DC | PRN

## 2020-09-28 MED ORDER — NIRMATRELVIR/RITONAVIR (PAXLOVID)TABLET: ORAL_TABLET | ORAL | 0 refills | Status: DC

## 2020-09-28 NOTE — Telephone Encounter (Signed)
See note from today

## 2020-09-28 NOTE — Telephone Encounter (Signed)
Pt said on 09/26/20 pt tested + for covid on home test; pt has fever, pt did not take but feels hot; pt thinks would be 101. Prod cough with yellow phlegm,slight SOB upon exertion, H/A pain level now is 8,slight body aches, pt has diarrhea but no vomiting, pt said taste and smell senses are not normal but has not lost completely. Runny nose and scratchy throat and head and sinus congestion. Pt wants to know if needs oral prescription med for symptoms. Pt scheduled video visit with Dr Einar Pheasant today at 11 AM. Sending note to Dr Einar Pheasant and [facility] CMA. Pt will have vital signs ready when CMA calls. UC precautions given and pt voiced understanding.

## 2020-09-28 NOTE — Progress Notes (Signed)
    I connected with [name] on 09/28/20 at 11:00 AM EDT by video and verified that I am speaking with the correct person using two identifiers.   I discussed the limitations, risks, security and privacy concerns of performing an evaluation and management service by video and the availability of in person appointments. I also discussed with the patient that there may be a patient responsible charge related to this service. The patient expressed understanding and agreed to proceed.  Patient location: Home Provider Location: [place] Participants: Lesleigh Noe and [name]   Subjective:     Valerie [name] is a 70 y.o. female presenting for Fever, Cough (Productive with yellow discharge ), Nasal Congestion, Headache, and Covid Positive (At home test positive on Sunday )     HPI  #Covid - started on 09/25/2020 - fever, cough, sinus congestion - overall breathing OK - headache - endorses diarrhea - blood pressure has been high - yellow drainage    - treatment: allergy pills w/o improvement, ibuprofen for the headache  Review of Systems   Social History   Tobacco Use  Smoking Status Never Smoker  Smokeless Tobacco Never Used        Objective:   BP Readings from Last 3 Encounters:  09/28/20 (!) 148/85  08/13/20 120/82  07/27/20 104/63   Wt Readings from Last 3 Encounters:  09/28/20 147 lb (66.7 kg)  08/13/20 148 lb (67.1 kg)  07/27/20 143 lb (64.9 kg)    BP (!) 148/85   Pulse 87   Wt 147 lb (66.7 kg)   BMI 24.46 kg/m    Physical Exam  Speaking in complete sentences  No distress        Assessment & Plan:   Problem List Items Addressed This Visit      Other   COVID-19 virus infection - Primary    Pt has risk factors - age, HTN. Discussed treated with Paxlovid and prescription provided. Day 3 of illness. She is isolating at home. Discussed ER precautions for worsening symptoms. Tessalon for symptom relief. Normal liver and kidney  function       Relevant Medications   nirmatrelvir/ritonavir EUA (PAXLOVID) TABS   benzonatate (TESSALON PERLES) 100 MG capsule     Interactive audio and video telecommunications were attempted between this provider and patient, however failed, due to patient having technical difficulties OR patient did not have access to video capability.  We continued and completed visit with audio only.   Start Time: 11:51 End Time: 12:03     Return if symptoms worsen or fail to improve.  Lesleigh Noe, MD

## 2020-09-28 NOTE — Assessment & Plan Note (Signed)
Pt has risk factors - age, HTN. Discussed treated with Paxlovid and prescription provided. Day 3 of illness. She is isolating at home. Discussed ER precautions for worsening symptoms. Tessalon for symptom relief. Normal liver and kidney function

## 2020-10-04 DIAGNOSIS — Z20822 Contact with and (suspected) exposure to covid-19: Secondary | ICD-10-CM | POA: Diagnosis not present

## 2020-11-01 ENCOUNTER — Telehealth: Payer: Self-pay

## 2020-11-01 NOTE — Telephone Encounter (Signed)
Refill request received from [facility] for BD shingle use swab and True Metrix level 1 contl soln. Do not see where patient has received in past. Did not see any DM medications or meter on list. Did we need to send in?

## 2020-11-02 NOTE — Telephone Encounter (Signed)
It looks like she has a history of prediabetes but glucose levels have been within normal range for the last year. Call patient to find out what's going on. At the moment, we don't have a valid diagnosis code to cover.

## 2020-11-03 NOTE — Telephone Encounter (Signed)
Called patient did not ask for his does not check blood sugars and did not need. No further action needed.

## 2020-11-30 ENCOUNTER — Other Ambulatory Visit: Payer: Self-pay

## 2020-11-30 ENCOUNTER — Emergency Department: Payer: Medicare HMO

## 2020-11-30 ENCOUNTER — Emergency Department
Admission: EM | Admit: 2020-11-30 | Discharge: 2020-11-30 | Disposition: A | Discharge status: Home / Self Care | Payer: Medicare HMO | Attending: Emergency Medicine | Admitting: Emergency Medicine

## 2020-11-30 DIAGNOSIS — R1013 Epigastric pain: Secondary | ICD-10-CM | POA: Diagnosis present

## 2020-11-30 DIAGNOSIS — Z79899 Other long term (current) drug therapy: Secondary | ICD-10-CM | POA: Diagnosis not present

## 2020-11-30 DIAGNOSIS — N3001 Acute cystitis with hematuria: Secondary | ICD-10-CM

## 2020-11-30 DIAGNOSIS — Z8616 Personal history of COVID-19: Secondary | ICD-10-CM | POA: Insufficient documentation

## 2020-11-30 DIAGNOSIS — I10 Essential (primary) hypertension: Secondary | ICD-10-CM | POA: Insufficient documentation

## 2020-11-30 DIAGNOSIS — R109 Unspecified abdominal pain: Secondary | ICD-10-CM | POA: Diagnosis not present

## 2020-11-30 LAB — COMPREHENSIVE METABOLIC PANEL
ALT: 17 U/L (ref 0–44)
AST: 20 U/L (ref 15–41)
Albumin: 3.8 g/dL (ref 3.5–5.0)
Alkaline Phosphatase: 51 U/L (ref 38–126)
Anion gap: 5 (ref 5–15)
BUN: 20 mg/dL (ref 8–23)
CO2: 29 mmol/L (ref 22–32)
Calcium: 9 mg/dL (ref 8.9–10.3)
Chloride: 106 mmol/L (ref 98–111)
Creatinine, Ser: 0.91 mg/dL (ref 0.44–1.00)
GFR, Estimated: >60 mL/min (ref >60)
Glucose, Bld: 115 mg/dL — ABNORMAL HIGH (ref 70–99)
Potassium: 3.7 mmol/L (ref 3.5–5.1)
Sodium: 140 mmol/L (ref 135–145)
Total Bilirubin: 0.6 mg/dL (ref 0.3–1.2)
Total Protein: 6.8 g/dL (ref 6.5–8.1)

## 2020-11-30 LAB — URINALYSIS, COMPLETE (UACMP) WITH MICROSCOPIC
Bilirubin Urine: NEGATIVE
Glucose, UA: NEGATIVE mg/dL
Ketones, ur: NEGATIVE mg/dL
Nitrite: POSITIVE — AB
Protein, ur: NEGATIVE mg/dL
RBC / HPF: >50 RBC/hpf — ABNORMAL HIGH (ref 0–5)
Specific Gravity, Urine: 1.02 (ref 1.005–1.030)
pH: 5 (ref 5.0–8.0)

## 2020-11-30 LAB — CBC
HCT: 37.7 % (ref 36.0–46.0)
Hemoglobin: 12.9 g/dL (ref 12.0–15.0)
MCH: 32.8 pg (ref 26.0–34.0)
MCHC: 34.2 g/dL (ref 30.0–36.0)
MCV: 95.9 fL (ref 80.0–100.0)
Platelets: 173 10*3/uL (ref 150–400)
RBC: 3.93 MIL/uL (ref 3.87–5.11)
RDW: 13.5 % (ref 11.5–15.5)
WBC: 4.5 10*3/uL (ref 4.0–10.5)
nRBC: 0 % (ref 0.0–0.2)

## 2020-11-30 LAB — LIPASE, BLOOD: Lipase: 67 U/L — ABNORMAL HIGH (ref 11–51)

## 2020-11-30 MED ORDER — CEPHALEXIN 500 MG PO CAPS: 500.0000 mg | ORAL_CAPSULE | Freq: Four times a day (QID) | ORAL | 0 refills | Status: AC

## 2020-11-30 MED ORDER — ALUM & MAG HYDROXIDE-SIMETH 200-200-20 MG/5ML PO SUSP
30.0000 mL | Freq: Once | ORAL | Status: AC
  Administered 2020-11-30: 30 mL via ORAL
  Filled 2020-11-30: qty 30

## 2020-11-30 MED ORDER — IOHEXOL 300 MG/ML  SOLN
100.0000 mL | Freq: Once | INTRAMUSCULAR | Status: AC | PRN
  Administered 2020-11-30: 100 mL via INTRAVENOUS

## 2020-11-30 MED ORDER — SUCRALFATE 1 G PO TABS
1.0000 g | ORAL_TABLET | Freq: Once | ORAL | Status: AC
  Administered 2020-11-30: 1 g via ORAL
  Filled 2020-11-30: qty 1

## 2020-11-30 NOTE — ED Triage Notes (Signed)
Pt comes with c/o epigastric belly pain that radiates down. Pt states this started 3-4 days ago. Pt states this has gotten worse.  Pt states no N/V/D.

## 2020-11-30 NOTE — ED Provider Notes (Signed)
[facility] Emergency Department Provider Note  ____________________________________________   Event Date/Time   First MD Initiated Contact with Patient 11/30/20 [phone]     (approximate)  I have reviewed the triage vital signs and the nursing notes.   HISTORY  Chief Complaint Abdominal Pain   HPI Valerie [name] is a 70 y.o. 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  She denies any nausea, vomiting, cough, shortness of breath, fevers, pain above the epigastrium and chest, diarrhea or constipation does endorse intermittent burning with urination.  Her pain radiates to her back on both sides in the middle.  No recent injuries or falls.  No significant recent NSAID or EtOH use.  She does states she has some steak last night and feels she can still taste it is worried that eating is making her pain worse and thinks it might relate to an ulcer.  She has no rashes or extremity pain.  No other acute concerns at this time.         Past Medical History:  Diagnosis Date   Acid reflux    Adenocarcinoma of the endometrium/uterus ([place]) 12/04/2000   Anal fissure    Atrophic vaginitis    Difficult intubation    limited neck mobility   Esophagitis    Hernia    Present to proximal to umbillical    History of hiatal hernia    Hyperlipidemia    Hypertension    Internal hemorrhoids    Menorrhagia    Osteopenia    PONV (postoperative nausea and vomiting)    Vertigo    Vitamin D deficiency     Patient Active Problem List   Diagnosis Date Noted   COVID-19 virus infection 09/28/2020   Chronic constipation 08/13/2020   Dysuria 08/13/2020   Encounter for screening colonoscopy    Otalgia of both ears 01/28/2020   Tinnitus of both  ears 10/02/2019   ETD (Eustachian tube dysfunction), right 04/29/2019   Weakness of both lower extremities 04/03/2019   Chronic back pain 04/03/2019   Encounter for annual general medical examination with abnormal findings in adult 02/14/2019   Vulvar dermatitis 01/01/2018   Allergic rhinitis 08/22/2017   Neck pain with history of cervical spinal surgery 01/22/2017   Prediabetes 11/16/2016   Hyperlipidemia 11/16/2015   Esophageal reflux    Hemorrhoid 06/30/2015   Atypical chest pain 01/04/2015   Palpitations 01/04/2015   Welcome to Medicare preventive visit 10/09/2014   Benign paroxysmal positional vertigo 08/31/2014   Hernia    Menorrhagia    Osteopenia    Vitamin D deficiency    Adenocarcinoma of the endometrium/uterus ([place])    Generalized anxiety disorder 01/23/2007   Essential hypertension 01/23/2007    Past Surgical History:  Procedure Laterality Date   24 HOUR [place] STUDY N/A 11/29/2015   Procedure: 24 HOUR PH STUDY;  Surgeon: Manus Gunning, MD;  Location: [facility];  Service: Gastroenterology;  Laterality: N/A;   ABDOMINAL HYSTERECTOMY  12/04/2000   also bilateral salpingo-oophorectomy   CARDIAC CATHETERIZATION  2006   CARPAL TUNNEL RELEASE  11/20/2011   Procedure: CARPAL TUNNEL RELEASE;  Surgeon: Elaina Hoops, MD;  Location: [place] [facility];  Service: Neurosurgery;  Laterality: Left;  LEFT carpal tunnel release   CERVICAL SPINE SURGERY  2013  [place]   COLONOSCOPY WITH PROPOFOL N/A 07/27/2020   Procedure: COLONOSCOPY WITH PROPOFOL;  Surgeon: Lucilla Lame, MD;  Location: [facility] ENDOSCOPY;  Service: Endoscopy;  Laterality: N/A;   DILATION AND CURETTAGE OF UTERUS     11/15/2000   ESOPHAGEAL MANOMETRY N/A 09/20/2015   Procedure: ESOPHAGEAL MANOMETRY (EM);  Surgeon: Manus Gunning, MD;  Location: [facility] ENDOSCOPY;  Service: Gastroenterology;  Laterality: N/A;   HYSTEROSCOPY  11/15/2000   D & C, resection of endometrial polyps   OOPHORECTOMY   12/04/2000   bilateral salpingo-oophorectomy done with TAH   SPINAL CORD STIMULATOR INSERTION Bilateral 04/12/2020   Procedure: INSERTION CERVICAL SPINAL STIMULATOR PULSE GENERATOR;  Surgeon: [name], MD;  Location: [facility] ORS;  Service: Neurosurgery;  Laterality: Bilateral;   SPINAL CORD STIMULATOR TRIAL N/A 03/22/2020   Procedure: CERVICAL SPINAL CORD STIMULATOR TRIAL PERCUTANEOUS;  Surgeon: [name], MD;  Location: [facility] ORS;  Service: Neurosurgery;  Laterality: N/A;   TUBAL LIGATION      Prior to Admission medications   Medication Sig Start Date End Date Taking? Authorizing Provider  cephALEXin (KEFLEX) 500 MG capsule Take 1 capsule (500 mg total) by mouth 4 (four) times daily for 5 days. 11/30/20 12/05/20 Yes Lucrezia Starch, MD  acetaminophen (TYLENOL) 500 MG tablet Take 500-1,000 mg by mouth every 6 (six) hours as needed (for pain).    [provider]  amLODipine (NORVASC) 2.5 MG tablet Take 2.5 mg by mouth daily. 02/06/20   [provider]  atorvastatin (LIPITOR) 10 MG tablet TAKE 1 TABLET ONCE DAILY FOR CHOLESTEROL. 10/30/19   Pleas Koch, NP  benzonatate (TESSALON PERLES) 100 MG capsule Take 1 capsule (100 mg total) by mouth 3 (three) times daily as needed for cough. 09/28/20   Lesleigh Noe, MD  DULoxetine (CYMBALTA) 20 MG capsule Take 1 capsule (20 mg total) by mouth daily. For anxiety/depression/pain 08/19/20   Pleas Koch, NP  losartan (COZAAR) 100 MG tablet TAKE 1 TABLET ONCE DAILY FOR BLOOD PRESSURE. 10/30/19   Pleas Koch, NP  metoprolol succinate (TOPROL-XL) 50 MG 24 hr tablet Take 1 tablet (50 mg total) by mouth daily. For heart rate/palpitations. 08/12/20   Pleas Koch, NP  nirmatrelvir/ritonavir EUA (PAXLOVID) TABS Patient GFR is 73. Take nirmatrelvir (150 mg) 2 tablet(s) twice daily for 5 days and ritonavir (100 mg) one tablet twice daily for 5 days. 09/28/20   Lesleigh Noe, MD  omeprazole (PRILOSEC) 40 MG capsule TAKE 1 CAPSULE  (40 MG TOTAL) BY MOUTH DAILY. FOR HEARTBURN. 08/24/20   Pleas Koch, NP  vitamin B-12 (CYANOCOBALAMIN) 1000 MCG tablet Take 1,000 mcg by mouth daily.    [provider]    Allergies Patient has no known allergies.  Family History  Problem Relation Age of Onset   Hypertension Father    Prostate cancer Father    Hypertension Sister    Diabetes Brother    Heart disease Brother    Hypertension Brother    Diabetes Brother    Heart disease Brother    Breast cancer Neg Hx     Social History Social History   Tobacco Use   Smoking status: Never   Smokeless tobacco: Never  Vaping Use   Vaping Use: Never used  Substance Use Topics   Alcohol use: No   Drug use: Never    Review of Systems  Review of Systems  Constitutional:  Negative for chills and fever.  HENT:  Negative for  sore throat.   Eyes:  Negative for pain.  Respiratory:  Negative for cough and stridor.   Cardiovascular:  Negative for chest pain.  Gastrointestinal:  Positive for abdominal pain. Negative for vomiting.  Genitourinary:  Positive for dysuria.  Musculoskeletal:  Positive for back pain (abd pain radiating to back).  Skin:  Negative for rash.  Neurological:  Negative for seizures, loss of consciousness and headaches.  Psychiatric/Behavioral:  Negative for suicidal ideas.   All other systems reviewed and are negative.    ____________________________________________   PHYSICAL EXAM:  VITAL SIGNS: ED Triage Vitals  Enc Vitals Group     BP 11/30/20 0812 (!) 146/95     Pulse Rate 11/30/20 0812 76     Resp 11/30/20 0812 16     Temp 11/30/20 0812 97.8 F (36.6 C)     Temp src --      SpO2 11/30/20 0812 97 %     Weight --      Height --      Head Circumference --      Peak Flow --      Pain Score 11/30/20 0810 6     Pain Loc --      Pain Edu? --      Excl. in [place]? --    Vitals:   11/30/20 0900 11/30/20 1003  BP: (!) 152/85 139/84  Pulse: 62 63  Resp: 19   Temp:    SpO2: 96%  100%   Physical Exam Vitals and nursing note reviewed.  Constitutional:      General: She is not in acute distress.    Appearance: She is well-developed.  HENT:     Head: Normocephalic and atraumatic.     Right Ear: External ear normal.     Left Ear: External ear normal.     Nose: Nose normal.  Eyes:     Conjunctiva/sclera: Conjunctivae normal.  Cardiovascular:     Rate and Rhythm: Normal rate and regular rhythm.     Heart sounds: No murmur heard. Pulmonary:     Effort: Pulmonary effort is normal. No respiratory distress.     Breath sounds: Normal breath sounds.  Abdominal:     Palpations: Abdomen is soft.     Tenderness: There is abdominal tenderness in the epigastric area. There is no right CVA tenderness or left CVA tenderness.  Musculoskeletal:     Cervical back: Neck supple.  Skin:    General: Skin is warm and dry.     Capillary Refill: Capillary refill takes less than 2 seconds.  Neurological:     Mental Status: She is alert and oriented to person, place, and time.  Psychiatric:        Mood and Affect: Mood normal.     ____________________________________________   LABS (all labs ordered are listed, but only abnormal results are displayed)  Labs Reviewed  LIPASE, BLOOD - Abnormal; Notable for the following components:      Result Value   Lipase 67 (*)    All other components within normal limits  COMPREHENSIVE METABOLIC PANEL - Abnormal; Notable for the following components:   Glucose, Bld 115 (*)    All other components within normal limits  URINALYSIS, COMPLETE (UACMP) WITH MICROSCOPIC - Abnormal; Notable for the following components:   Color, Urine YELLOW (*)    APPearance CLOUDY (*)    Hgb urine dipstick LARGE (*)    Nitrite POSITIVE (*)    Leukocytes,Ua LARGE (*)    RBC /  HPF >50 (*)    Bacteria, UA MANY (*)    Non Squamous Epithelial PRESENT (*)    All other components within normal limits  URINE CULTURE  CBC    ____________________________________________  EKG  Sinus rhythm with a ventricular of 80, normal axis, unremarkable intervals without evidence of acute ischemia or significant underlying arrhythmia. ____________________________________________  RADIOLOGY  ED MD interpretation: CT abdomen pelvis remarkable for no evidence of pyelonephritis, kidney stone, diverticulitis, pancreatitis or other acute abdominopelvic process.  Official radiology report(s): CT ABDOMEN PELVIS W CONTRAST  Result Date: 11/30/2020 CLINICAL DATA:  Epigastric abdominal pain for 3-4 days EXAM: CT ABDOMEN AND PELVIS WITH CONTRAST TECHNIQUE: Multidetector CT imaging of the abdomen and pelvis was performed using the standard protocol following bolus administration of intravenous contrast. CONTRAST:  112mL OMNIPAQUE IOHEXOL 300 MG/ML  SOLN COMPARISON:  None. FINDINGS: Lower chest: No acute abnormality. Hepatobiliary: No focal liver abnormality is seen. Status post cholecystectomy. Mild postoperative biliary dilatation. Pancreas: Unremarkable. No pancreatic ductal dilatation or surrounding inflammatory changes. Spleen: Normal in size without significant abnormality. Adrenals/Urinary Tract: Adrenal glands are unremarkable. Kidneys are normal, without renal calculi, solid lesion, or hydronephrosis. Bladder is unremarkable. Stomach/Bowel: Stomach is within normal limits. Appendix is not clearly visualized and may be surgically absent. No evidence of bowel wall thickening, distention, or inflammatory changes. Vascular/Lymphatic: Aortic atherosclerosis. No enlarged abdominal or pelvic lymph nodes. Reproductive: Status post hysterectomy. Other: No abdominal wall hernia or abnormality. No abdominopelvic ascites. Musculoskeletal: No acute or significant osseous findings. IMPRESSION: 1. No acute CT findings of the abdomen or pelvis to explain epigastric pain. 2. Status post cholecystectomy and hysterectomy. Aortic Atherosclerosis  (ICD10-I70.0). Electronically Signed   By: Eddie Candle M.D.   On: 11/30/2020 10:00    ____________________________________________   PROCEDURES  Procedure(s) performed (including Critical Care):  Procedures   ____________________________________________   INITIAL IMPRESSION / ASSESSMENT AND PLAN / ED COURSE      Patient presents with above-stated history exam consistent of some epigastric pain rating to lower abdomen associated intermittent burning with urination pain rating into the mid back.  He denies any abnormal vaginal bleeding or discharge or symptoms in her chest or upper back.  No recent falls or injuries.  She is status post hysterectomy and cholecystectomy.  Differential includes possible kidney stone, pyonephritis, peptic ulcer disease, pancreatitis, diverticulitis, cystitis and AAA.  ECG is not suggestive of atypical presentation for ACS.  CT abdomen pelvis remarkable for no evidence of pyelonephritis, kidney stone, diverticulitis, pancreatitis or other acute abdominopelvic process.  Lipase is 67 not consistent with acute pancreatitis given reassuring CT findings.  CMP without any significant electrode or metabolic derangements.  No evidence of hepatitis and bilirubin is normal.  CBC without leukocytosis or acute anemia.  UA with large hemoglobin and positive nitrites, large leukocyte esterase, greater than RBCs and 21-50 WBCs with many bacteria.  Urine culture sent.  Impression is urinary tract infection likely uncomplicated and exacerbating peptic ulcer disease.  Do not believe patient is septic or has pyonephritis.  Will write short course of Keflex.  Patient also given Carafate and Maalox in the ED and felt little better on reassessment.  Advised her to continue taking Meprazole.  Discharged stable condition.  Strict return precautions advised and discussed.       ____________________________________________   FINAL CLINICAL IMPRESSION(S) / ED DIAGNOSES  Final  diagnoses:  Acute cystitis with hematuria    Medications  sucralfate (CARAFATE) tablet 1 g (1 g Oral Given 11/30/20 0839)  alum & mag hydroxide-simeth (MAALOX/MYLANTA) 200-200-20 MG/5ML suspension 30 mL (30 mLs Oral Given 11/30/20 0839)  iohexol (OMNIPAQUE) 300 MG/ML solution 100 mL (100 mLs Intravenous Contrast Given 11/30/20 0932)     ED Discharge Orders          Ordered    cephALEXin (KEFLEX) 500 MG capsule  4 times daily        11/30/20 1014             Note:  This document was prepared using Dragon voice recognition software and may include unintentional dictation errors.    Lucrezia Starch, MD 11/30/20 1028

## 2020-11-30 NOTE — ED Triage Notes (Signed)
FIRST NURSE NOTE: pt c/o epigastric pain that radiates down into the abd

## 2020-11-30 NOTE — ED Notes (Signed)
Patient transported to CT 

## 2020-12-02 LAB — URINE CULTURE: Culture: >=100000 — AB

## 2020-12-03 NOTE — Progress Notes (Signed)
ED Antimicrobial Stewardship Positive Culture Follow Up   Valerie [name] is an 70 y.o. female who presented to [facility] on 11/30/2020 with a chief complaint of  Chief Complaint  Patient presents with   Abdominal Pain    Recent Results (from the past 720 hour(s))  Urine Culture     Status: Abnormal   Collection Time: 11/30/20  8:43 AM   Specimen: Urine, Random  Result Value Ref Range Status   Specimen Description   Final    URINE, RANDOM Performed at [facility], [address], [place], [place] [address]    Special Requests   Final    NONE Performed at [facility], [address] [place], [place] [address]    Culture >=100,000 COLONIES/mL ESCHERICHIA COLI (A)  Final   Report Status 12/02/2020 FINAL  Final   Organism ID, Bacteria ESCHERICHIA COLI (A)  Final      Susceptibility   Escherichia coli - MIC*    AMPICILLIN >=32 RESISTANT Resistant     CEFAZOLIN >=64 RESISTANT Resistant     CEFEPIME <=0.12 SENSITIVE Sensitive     CEFTRIAXONE 0.5 SENSITIVE Sensitive     CIPROFLOXACIN <=0.25 SENSITIVE Sensitive     GENTAMICIN <=1 SENSITIVE Sensitive     IMIPENEM <=0.25 SENSITIVE Sensitive     NITROFURANTOIN <=16 SENSITIVE Sensitive     TRIMETH/SULFA <=20 SENSITIVE Sensitive     AMPICILLIN/SULBACTAM >=32 RESISTANT Resistant     PIP/TAZO 16 SENSITIVE Sensitive     * >=100,000 COLONIES/mL ESCHERICHIA COLI    [x]  Treated with Keflex, organism resistant to prescribed antimicrobial []  Patient discharged originally without antimicrobial agent and treatment is now indicated  New antibiotic prescription: Cefdinir 300mg  BID x7d; No Refills  Spoke with pt: ([phone]) discussed culture results and to stop keflex and replace with new Rx above. Confirmed preferred pharmacy was [facility] on [address] in [place].   Called Pharmacy: Spoke with pharmacist at [facility] [phone]); left Rx with above directions and will re-enforce pt stop old Rx.  ED Provider: Dr.  Lula Olszewski D Delshon Blanchfield 12/03/2020, 2:45 PM Clinical Pharmacist

## 2020-12-08 ENCOUNTER — Ambulatory Visit (INDEPENDENT_AMBULATORY_CARE_PROVIDER_SITE_OTHER): Payer: Medicare HMO | Admitting: Primary Care

## 2020-12-08 ENCOUNTER — Other Ambulatory Visit: Payer: Self-pay

## 2020-12-08 ENCOUNTER — Encounter: Payer: Self-pay | Admitting: Primary Care

## 2020-12-08 VITALS — BP 144/88 | HR 82 | Temp 98.4°F | Ht 65.0 in | Wt 147.0 lb

## 2020-12-08 DIAGNOSIS — N3001 Acute cystitis with hematuria: Secondary | ICD-10-CM | POA: Diagnosis not present

## 2020-12-08 DIAGNOSIS — R3129 Other microscopic hematuria: Secondary | ICD-10-CM | POA: Diagnosis not present

## 2020-12-08 HISTORY — DX: Acute cystitis with hematuria: N30.01

## 2020-12-08 LAB — CBC WITH DIFFERENTIAL/PLATELET
Basophils Absolute: 0 10*3/uL (ref 0.0–0.1)
Basophils Relative: 0.4 % (ref 0.0–3.0)
Eosinophils Absolute: 0.1 10*3/uL (ref 0.0–0.7)
Eosinophils Relative: 1.2 % (ref 0.0–5.0)
HCT: 40 % (ref 36.0–46.0)
Hemoglobin: 13.7 g/dL (ref 12.0–15.0)
Lymphocytes Relative: 43 % (ref 12.0–46.0)
Lymphs Abs: 2 10*3/uL (ref 0.7–4.0)
MCHC: 34.3 g/dL (ref 30.0–36.0)
MCV: 94.6 fl (ref 78.0–100.0)
Monocytes Absolute: 0.4 10*3/uL (ref 0.1–1.0)
Monocytes Relative: 7.8 % (ref 3.0–12.0)
Neutro Abs: 2.3 10*3/uL (ref 1.4–7.7)
Neutrophils Relative %: 47.6 % (ref 43.0–77.0)
Platelets: 237 10*3/uL (ref 150.0–400.0)
RBC: 4.23 Mil/uL (ref 3.87–5.11)
RDW: 14.8 % (ref 11.5–15.5)
WBC: 4.8 10*3/uL (ref 4.0–10.5)

## 2020-12-08 LAB — BASIC METABOLIC PANEL
BUN: 16 mg/dL (ref 6–23)
CO2: 27 mEq/L (ref 19–32)
Calcium: 9.6 mg/dL (ref 8.4–10.5)
Chloride: 104 mEq/L (ref 96–112)
Creatinine, Ser: 0.93 mg/dL (ref 0.40–1.20)
GFR: 63.26 mL/min (ref >60.00)
Glucose, Bld: 109 mg/dL — ABNORMAL HIGH (ref 70–99)
Potassium: 4.1 mEq/L (ref 3.5–5.1)
Sodium: 139 mEq/L (ref 135–145)

## 2020-12-08 LAB — POC URINALSYSI DIPSTICK (AUTOMATED)
Bilirubin, UA: NEGATIVE
Glucose, UA: NEGATIVE
Ketones, UA: NEGATIVE
Leukocytes, UA: NEGATIVE
Nitrite, UA: NEGATIVE
Protein, UA: NEGATIVE
Spec Grav, UA: 1.015 (ref 1.010–1.025)
Urobilinogen, UA: 0.2 E.U./dL
pH, UA: 6 (ref 5.0–8.0)

## 2020-12-08 LAB — URINALYSIS, MICROSCOPIC ONLY: RBC / HPF: NONE SEEN (ref 0–?)

## 2020-12-08 MED ORDER — SULFAMETHOXAZOLE-TRIMETHOPRIM 800-160 MG PO TABS: 1.0000 | ORAL_TABLET | Freq: Two times a day (BID) | ORAL | 0 refills | Status: DC

## 2020-12-08 NOTE — Progress Notes (Signed)
Subjective:    Patient ID: Valerie [name], female    DOB: [birth year], 70 y.o.   MRN: [id number]  HPI  Valerie [name] is a very pleasant 70 y.o. female with a history of cystitis, hypertension, chronic constipation, hemorrhoids, cholecystectomy, GERD, adenocarcinoma of the uterus who presents today for ED follow up.  She presented to [facility] ED on 11/30/20 for symptoms of epigastric abdominal pain that began 2-3 days prior. During her stay in the ED she underwent CT abdomen/pelvis which was negative for acute findings. Lipase negative, CBC without leukocytosis. UA with large hemoglobin and leuks, many bacteria. She was diagnosed and treated for acute cystitis, uncomplicated, with Keflex. She was also provided Carafate and maalox in the ED with improvement. She was discharged home later that morning.   Urine culture returned a few days later, E coli infection, resistance to cefazolin and ampicillin. Today she endorses that she was called a few days after her ED visit and was told to stop Keflex and to start cefdinir 300 mg. She's been taking this BID as directed, will finish in a few days.  She continues to feel nauseated, fatigued, right sided back pain, and with epigastric pain. She's not feeling any better. She is compliant to her cefdinir and is drinking a lot of water mostly, some unsweet tea caffeine free.   She is wanting to see GI for chronic epigastric pain, pain with eating, bowel changes. She underwent colonoscopy in March 2022 per Dr. Allen Norris, internal hemorrhoids were a finding, otherwise negative, due for repeat colonoscopy in 10 years.    Review of Systems  Constitutional:  Positive for fatigue. Negative for chills and fever.  Gastrointestinal:  Positive for abdominal pain, constipation and nausea. Negative for diarrhea and vomiting.        Past Medical History:  Diagnosis Date   Acid reflux    Adenocarcinoma of the endometrium/uterus ([place]) 12/04/2000   Anal fissure    Atrophic  vaginitis    Difficult intubation    limited neck mobility   Esophagitis    Hernia    Present to proximal to umbillical    History of hiatal hernia    Hyperlipidemia    Hypertension    Internal hemorrhoids    Menorrhagia    Osteopenia    PONV (postoperative nausea and vomiting)    Vertigo    Vitamin D deficiency     Social History   Socioeconomic History   Marital status: Married    Spouse name: Not on file   Number of children: Not on file   Years of education: Not on file   Highest education level: Not on file  Occupational History   Not on file  Tobacco Use   Smoking status: Never   Smokeless tobacco: Never  Vaping Use   Vaping Use: Never used  Substance and Sexual Activity   Alcohol use: No   Drug use: Never   Sexual activity: Yes    Birth control/protection: Post-menopausal, Surgical  Other Topics Concern   Not on file  Social History Narrative   Married   Lives in [place]   Has 2 children, 4 grandchildren   Enjoys walking, shopping.   Social Determinants of Health   Financial Resource Strain: Not on file  Food Insecurity: Not on file  Transportation Needs: Not on file  Physical Activity: Not on file  Stress: Not on file  Social Connections: Not on file  Intimate Partner Violence: Not on file  Past Surgical History:  Procedure Laterality Date   23 HOUR [place] STUDY N/A 11/29/2015   Procedure: 24 HOUR PH STUDY;  Surgeon: Manus Gunning, MD;  Location: [facility] ENDOSCOPY;  Service: Gastroenterology;  Laterality: N/A;   ABDOMINAL HYSTERECTOMY  12/04/2000   also bilateral salpingo-oophorectomy   CARDIAC CATHETERIZATION  2006   CARPAL TUNNEL RELEASE  11/20/2011   Procedure: CARPAL TUNNEL RELEASE;  Surgeon: Elaina Hoops, MD;  Location: [place] [facility] ORS;  Service: Neurosurgery;  Laterality: Left;  LEFT carpal tunnel release   CERVICAL SPINE SURGERY  2013   [place]   COLONOSCOPY WITH PROPOFOL N/A 07/27/2020   Procedure: COLONOSCOPY WITH  PROPOFOL;  Surgeon: Lucilla Lame, MD;  Location: [facility] ENDOSCOPY;  Service: Endoscopy;  Laterality: N/A;   DILATION AND CURETTAGE OF UTERUS     11/15/2000   ESOPHAGEAL MANOMETRY N/A 09/20/2015   Procedure: ESOPHAGEAL MANOMETRY (EM);  Surgeon: Manus Gunning, MD;  Location: [facility] ENDOSCOPY;  Service: Gastroenterology;  Laterality: N/A;   HYSTEROSCOPY  11/15/2000   D & C, resection of endometrial polyps   OOPHORECTOMY  12/04/2000   bilateral salpingo-oophorectomy done with TAH   SPINAL CORD STIMULATOR INSERTION Bilateral 04/12/2020   Procedure: INSERTION CERVICAL SPINAL STIMULATOR PULSE GENERATOR;  Surgeon: [name], MD;  Location: [facility] ORS;  Service: Neurosurgery;  Laterality: Bilateral;   SPINAL CORD STIMULATOR TRIAL N/A 03/22/2020   Procedure: CERVICAL SPINAL CORD STIMULATOR TRIAL PERCUTANEOUS;  Surgeon: [name], MD;  Location: [facility] ORS;  Service: Neurosurgery;  Laterality: N/A;   TUBAL LIGATION      Family History  Problem Relation Age of Onset   Hypertension Father    Prostate cancer Father    Hypertension Sister    Diabetes Brother    Heart disease Brother    Hypertension Brother    Diabetes Brother    Heart disease Brother    Breast cancer Neg Hx     No Known Allergies  Current Outpatient Medications on File Prior to Visit  Medication Sig Dispense Refill   acetaminophen (TYLENOL) 500 MG tablet Take 500-1,000 mg by mouth every 6 (six) hours as needed (for pain).     amLODipine (NORVASC) 2.5 MG tablet Take 2.5 mg by mouth daily.     atorvastatin (LIPITOR) 10 MG tablet TAKE 1 TABLET ONCE DAILY FOR CHOLESTEROL. 90 tablet 2   DULoxetine (CYMBALTA) 20 MG capsule Take 1 capsule (20 mg total) by mouth daily. For anxiety/depression/pain 90 capsule 3   losartan (COZAAR) 100 MG tablet TAKE 1 TABLET ONCE DAILY FOR BLOOD PRESSURE. 90 tablet 2   metoprolol succinate (TOPROL-XL) 50 MG 24 hr tablet Take 1 tablet (50 mg total) by mouth daily. For heart rate/palpitations. 90  tablet 3   nirmatrelvir/ritonavir EUA (PAXLOVID) TABS Patient GFR is 73. Take nirmatrelvir (150 mg) 2 tablet(s) twice daily for 5 days and ritonavir (100 mg) one tablet twice daily for 5 days. 30 tablet 0   omeprazole (PRILOSEC) 40 MG capsule TAKE 1 CAPSULE (40 MG TOTAL) BY MOUTH DAILY. FOR HEARTBURN. 90 capsule 3   No current facility-administered medications on file prior to visit.    BP (!) 144/88   Pulse 82   Temp 98.4 F (36.9 C) (Temporal)   Ht 5\' 5"  (1.651 m)   Wt 147 lb (66.7 kg)   SpO2 96%   BMI 24.46 kg/m  Objective:   Physical Exam Constitutional:      General: She is not in acute distress.  Appearance: She is not ill-appearing.  Pulmonary:     Effort: Pulmonary effort is normal.  Abdominal:     Tenderness: There is generalized abdominal tenderness and tenderness in the right upper quadrant, right lower quadrant, epigastric area, suprapubic area, left upper quadrant and left lower quadrant. There is no guarding.  Neurological:     Mental Status: She is alert.          Assessment & Plan:      This visit occurred during the SARS-CoV-2 public health emergency.  Safety protocols were in place, including screening questions prior to the visit, additional usage of staff PPE, and extensive cleaning of exam room while observing appropriate contact time as indicated for disinfecting solutions.

## 2020-12-08 NOTE — Assessment & Plan Note (Signed)
With E coli on urine culture which shows resistance to ampicillin and cefazolin.   Unclear why she was switched from cephalexin to cefdinir given culture sensitivity report. She is not improved.  Fortunately the recent CT abdomen/pelvis is negative. She is stable today.   UA today with 3+ blood, negative for leuks and nitrites. Culture sent.   Looking back it seems she's had chronic micorscopic hematuria. Will add microscopic to today's urine test. Recent imaging of the bladder was "unremarkable". May need urology work up, don't see prior work up for hematuria.   Given no improvement in symptoms (with antibiotic treatment from same family), coupled with UA results, will trial Bactrim DS tablets x 3 days. She wil update if no improvement.

## 2020-12-08 NOTE — Assessment & Plan Note (Signed)
Chronic, noted with numerous UA's, some with positive and negative urine culture results.   CT abdomen/pelvis with "unremarkable" bladder, however given history of uterine cancer she will need Urology evaluation.  Checking urine micro today. Await results. Will likely send to Urology pending results. She agrees.

## 2020-12-08 NOTE — Patient Instructions (Addendum)
Call Dr. Dorothey Baseman office through [facility] to set up an appointment for your stomach issues.  Start Bactrim DS (sulfamethoxazole/trimethoprim) tablets for urinary tract infection. Take 1 tablet by mouth twice daily for 3 days.  Stop taking cefdinir antibiotics.   Stop by the lab prior to leaving today. I will notify you of your results once received.   It was a pleasure to see you today!

## 2020-12-09 LAB — URINE CULTURE
MICRO NUMBER:: 12114416
Result:: NO GROWTH
SPECIMEN QUALITY:: ADEQUATE

## 2020-12-13 ENCOUNTER — Telehealth: Payer: Self-pay

## 2020-12-13 ENCOUNTER — Other Ambulatory Visit: Payer: Self-pay

## 2020-12-13 DIAGNOSIS — N390 Urinary tract infection, site not specified: Secondary | ICD-10-CM

## 2020-12-13 DIAGNOSIS — E785 Hyperlipidemia, unspecified: Secondary | ICD-10-CM

## 2020-12-13 DIAGNOSIS — I10 Essential (primary) hypertension: Secondary | ICD-10-CM

## 2020-12-13 DIAGNOSIS — R3129 Other microscopic hematuria: Secondary | ICD-10-CM

## 2020-12-13 MED ORDER — ATORVASTATIN CALCIUM 10 MG PO TABS: ORAL_TABLET | ORAL | 1 refills | Status: DC

## 2020-12-13 MED ORDER — LOSARTAN POTASSIUM 100 MG PO TABS: ORAL_TABLET | ORAL | 1 refills | Status: DC

## 2020-12-13 NOTE — Telephone Encounter (Signed)
Pt left v/m requesting cb about pt receiving a message that Gentry Fitz NP had sent in 2 new prescriptions.sending note to [employer] CMA.

## 2020-12-14 NOTE — Telephone Encounter (Signed)
Patient called back. Discussed that the message about RXs was just a notification from the system that her medications were refilled to mail order pharmacy. Patient understood.  Patient was also advised that her urine results were negative for infection. Patient stated that Alma Friendly mentioned been referred to urologist and if that is the next step patient would like to go to [facility] location. Thank you

## 2020-12-14 NOTE — Telephone Encounter (Signed)
Noted, will refer to [facility] for microscopic hematuria and recurrent UTI.

## 2020-12-15 DIAGNOSIS — R002 Palpitations: Secondary | ICD-10-CM | POA: Diagnosis not present

## 2020-12-15 DIAGNOSIS — I700 Atherosclerosis of aorta: Secondary | ICD-10-CM | POA: Insufficient documentation

## 2020-12-15 DIAGNOSIS — I10 Essential (primary) hypertension: Secondary | ICD-10-CM | POA: Diagnosis not present

## 2020-12-15 DIAGNOSIS — E782 Mixed hyperlipidemia: Secondary | ICD-10-CM | POA: Diagnosis not present

## 2020-12-16 ENCOUNTER — Encounter: Payer: Self-pay | Admitting: Urology

## 2020-12-16 ENCOUNTER — Ambulatory Visit (INDEPENDENT_AMBULATORY_CARE_PROVIDER_SITE_OTHER): Payer: Medicare HMO | Admitting: Urology

## 2020-12-16 ENCOUNTER — Other Ambulatory Visit: Payer: Self-pay

## 2020-12-16 VITALS — BP 132/80 | HR 83 | Ht 65.0 in | Wt 146.0 lb

## 2020-12-16 DIAGNOSIS — N390 Urinary tract infection, site not specified: Secondary | ICD-10-CM

## 2020-12-16 DIAGNOSIS — R3129 Other microscopic hematuria: Secondary | ICD-10-CM | POA: Diagnosis not present

## 2020-12-16 MED ORDER — ESTRADIOL 0.1 MG/GM VA CREA: TOPICAL_CREAM | VAGINAL | 3 refills | Status: DC

## 2020-12-16 NOTE — Patient Instructions (Addendum)
Cranberry tablets twice daily and start topical estrogen cream  Urinary Tract Infection, Adult  A urinary tract infection (UTI) is an infection of any part of the urinary tract. The urinary tract includes the kidneys, ureters, bladder, and urethra.These organs make, store, and get rid of urine in the body. An upper UTI affects the ureters and kidneys. A lower UTI affects the bladderand urethra. What are the causes? Most urinary tract infections are caused by bacteria in your genital area around your urethra, where urine leaves your body. These bacteria grow andcause inflammation of your urinary tract. What increases the risk? You are more likely to develop this condition if: You have a urinary catheter that stays in place. You are not able to control when you urinate or have a bowel movement (incontinence). You are female and you: Use a spermicide or diaphragm for birth control. Have low estrogen levels. Are pregnant. You have certain genes that increase your risk. You are sexually active. You take antibiotic medicines. You have a condition that causes your flow of urine to slow down, such as: An enlarged prostate, if you are female. Blockage in your urethra. A kidney stone. A nerve condition that affects your bladder control (neurogenic bladder). Not getting enough to drink, or not urinating often. You have certain medical conditions, such as: Diabetes. A weak disease-fighting system (immunesystem). Sickle cell disease. Gout. Spinal cord injury. What are the signs or symptoms? Symptoms of this condition include: Needing to urinate right away (urgency). Frequent urination. This may include small amounts of urine each time you urinate. Pain or burning with urination. Blood in the urine. Urine that smells bad or unusual. Trouble urinating. Cloudy urine. Vaginal discharge, if you are female. Pain in the abdomen or the lower back. You may also have: Vomiting or a decreased  appetite. Confusion. Irritability or tiredness. A fever or chills. Diarrhea. The first symptom in older adults may be confusion. In some cases, they may nothave any symptoms until the infection has worsened. How is this diagnosed? This condition is diagnosed based on your medical history and a physical exam. You may also have other tests, including: Urine tests. Blood tests. Tests for STIs (sexually transmitted infections). If you have had more than one UTI, a cystoscopy or imaging studies may be doneto determine the cause of the infections. How is this treated? Treatment for this condition includes: Antibiotic medicine. Over-the-counter medicines to treat discomfort. Drinking enough water to stay hydrated. If you have frequent infections or have other conditions such as a kidney stone, you may need to see a health care provider who specializes in the urinary tract (urologist). In rare cases, urinary tract infections can cause sepsis. Sepsis is a life-threatening condition that occurs when the body responds to an infection. Sepsis is treated in the hospital with IV antibiotics, fluids, and othermedicines. Follow these instructions at home:  Medicines Take over-the-counter and prescription medicines only as told by your health care provider. If you were prescribed an antibiotic medicine, take it as told by your health care provider. Do not stop using the antibiotic even if you start to feel better. General instructions Make sure you: Empty your bladder often and completely. Do not hold urine for long periods of time. Empty your bladder after sex. Wipe from front to back after urinating or having a bowel movement if you are female. Use each tissue only one time when you wipe. Drink enough fluid to keep your urine pale yellow. Keep all follow-up visits. This is  important. Contact a health care provider if: Your symptoms do not get better after 1-2 days. Your symptoms go away and then  return. Get help right away if: You have severe pain in your back or your lower abdomen. You have a fever or chills. You have nausea or vomiting. Summary A urinary tract infection (UTI) is an infection of any part of the urinary tract, which includes the kidneys, ureters, bladder, and urethra. Most urinary tract infections are caused by bacteria in your genital area. Treatment for this condition often includes antibiotic medicines. If you were prescribed an antibiotic medicine, take it as told by your health care provider. Do not stop using the antibiotic even if you start to feel better. Keep all follow-up visits. This is important. This information is not intended to replace advice given to you by your health care provider. Make sure you discuss any questions you have with your healthcare provider. Document Revised: 12/26/2019 Document Reviewed: 12/26/2019 Elsevier Patient Education  [place].

## 2020-12-16 NOTE — Progress Notes (Signed)
12/16/20 11:02 AM   Valerie [name] 1950-08-09 [id number]  CC: Recurrent UTI  HPI: Saw mostly in urology clinic today for evaluation of recurrent UTIs.  She is a 70 year old female with no prior bladder surgeries who reports to E. coli UTIs in the last 5 months.  These are culture documented in epic, and show E. coli in March and July 2022 resistant to Augmentin and cefazolin.  Her symptoms were back pain, dysuria.  She denies problems of recurrent infections in the past.  She denies any gross hematuria not associated with UTI.  Repeat urinalysis after treatment of her UTI showed no microscopic hematuria.  She denies any urinary complaints today.  She does have trouble with atrophic vaginitis and bleeding with sexual activity.  She has problems with constipation.  A CT performed for abdominal pain on 11/30/2020 was completely benign  PMH: Past Medical History:  Diagnosis Date   Acid reflux    Adenocarcinoma of the endometrium/uterus ([place]) 12/04/2000   Anal fissure    Atrophic vaginitis    Difficult intubation    limited neck mobility   Esophagitis    Hernia    Present to proximal to umbillical    History of hiatal hernia    Hyperlipidemia    Hypertension    Internal hemorrhoids    Menorrhagia    Osteopenia    PONV (postoperative nausea and vomiting)    Vertigo    Vitamin D deficiency     Surgical History: Past Surgical History:  Procedure Laterality Date   50 HOUR [place] STUDY N/A 11/29/2015   Procedure: 24 HOUR [place] STUDY;  Surgeon: Manus Gunning, MD;  Location: [facility];  Service: Gastroenterology;  Laterality: N/A;   ABDOMINAL HYSTERECTOMY  12/04/2000   also bilateral salpingo-oophorectomy   CARDIAC CATHETERIZATION  2006   CARPAL TUNNEL RELEASE  11/20/2011   Procedure: CARPAL TUNNEL RELEASE;  Surgeon: Elaina Hoops, MD;  Location: [facility];  Service: Neurosurgery;  Laterality: Left;  LEFT carpal tunnel release   CERVICAL SPINE SURGERY  2013   [place]   COLONOSCOPY WITH PROPOFOL N/A 07/27/2020   Procedure: COLONOSCOPY WITH PROPOFOL;  Surgeon: Lucilla Lame, MD;  Location: [facility];  Service: Endoscopy;  Laterality: N/A;   DILATION AND CURETTAGE OF UTERUS     11/15/2000   ESOPHAGEAL MANOMETRY N/A 09/20/2015   Procedure: ESOPHAGEAL MANOMETRY (EM);  Surgeon: Manus Gunning, MD;  Location: [facility];  Service: Gastroenterology;  Laterality: N/A;   HYSTEROSCOPY  11/15/2000   D & C, resection of endometrial polyps   OOPHORECTOMY  12/04/2000   bilateral salpingo-oophorectomy done with TAH   SPINAL CORD STIMULATOR INSERTION Bilateral 04/12/2020   Procedure: INSERTION CERVICAL SPINAL STIMULATOR PULSE GENERATOR;  Surgeon: [name], MD;  Location: [facility] ORS;  Service: Neurosurgery;  Laterality: Bilateral;   SPINAL CORD STIMULATOR TRIAL N/A 03/22/2020   Procedure: CERVICAL SPINAL CORD STIMULATOR TRIAL PERCUTANEOUS;  Surgeon: [name], MD;  Location: [facility] ORS;  Service: Neurosurgery;  Laterality: N/A;   TUBAL LIGATION        Family History: Family History  Problem Relation Age of Onset   Hypertension Father    Prostate cancer Father    Hypertension Sister    Diabetes Brother    Heart disease Brother    Hypertension Brother    Diabetes Brother    Heart disease Brother    Breast cancer Neg Hx     Social History:  reports that she  has never smoked. She has never used smokeless tobacco. She reports that she does not drink alcohol and does not use drugs.  Physical Exam: BP 132/80 (BP Location: Left Arm, Patient Position: Sitting, Cuff Size: Normal)   Pulse 83   Ht 5\' 5"  (1.651 m)   Wt 146 lb (66.2 kg)   BMI 24.30 kg/m    Constitutional:  Alert and oriented, No acute distress. Cardiovascular: No clubbing, cyanosis, or edema. Respiratory: Normal respiratory effort, no increased work of breathing. GI: Abdomen is soft, nontender, nondistended, no abdominal masses   Laboratory Data: Reviewed,  see HPI  Pertinent Imaging: I have personally viewed and interpreted the CT abdomen and pelvis with contrast dated 11/30/2020 that shows no hydronephrosis, stones, or other urologic abnormalities.  Assessment & Plan:   70 year old female with 2 culture documented E. coli UTIs in the last 4 months, CT abdomen and pelvis earlier this month benign  We discussed the evaluation and treatment of patients with recurrent UTIs at length.  We specifically discussed the differences between asymptomatic bacteriuria and true urinary tract infection.  We discussed the AUA definition of recurrent UTI of at least 2 culture proven symptomatic acute cystitis episodes in a 79-month period, or 3 within a 1 year period.  We discussed the importance of culture directed antibiotic treatment, and antibiotic stewardship.  First-line therapy includes nitrofurantoin(5 days), Bactrim(3 days), or fosfomycin(3 g single dose).  Possible etiologies of recurrent infection include periurethral tissue atrophy in postmenopausal woman, constipation, sexual activity, incomplete emptying, anatomic abnormalities, and even genetic predisposition.  Finally, we discussed the role of perineal hygiene, timed voiding, adequate hydration, topical vaginal estrogen, cranberry prophylaxis, and low-dose antibiotic prophylaxis.  Trial of cranberry tablets and topical estrogen cream for recurrent UTIs RTC with PA 6 months  Nickolas Madrid, MD 12/16/2020  [facility] [address] [place], [place] [address] [phone]

## 2020-12-17 LAB — MICROSCOPIC EXAMINATION: Bacteria, UA: NONE SEEN

## 2020-12-17 LAB — URINALYSIS, COMPLETE
Bilirubin, UA: NEGATIVE
Glucose, UA: NEGATIVE
Ketones, UA: NEGATIVE
Nitrite, UA: NEGATIVE
Protein,UA: NEGATIVE
Specific Gravity, UA: 1.02 (ref 1.005–1.030)
Urobilinogen, Ur: 1 mg/dL (ref 0.2–1.0)
pH, UA: 6 (ref 5.0–7.5)

## 2021-01-10 ENCOUNTER — Ambulatory Visit
Admission: RE | Admit: 2021-01-10 | Discharge: 2021-01-10 | Disposition: A | Discharge status: Home / Self Care | Payer: Medicare HMO | Source: Ambulatory Visit | Attending: Primary Care | Admitting: Primary Care

## 2021-01-10 ENCOUNTER — Other Ambulatory Visit: Payer: Self-pay

## 2021-01-10 ENCOUNTER — Telehealth: Payer: Self-pay

## 2021-01-10 DIAGNOSIS — Z1231 Encounter for screening mammogram for malignant neoplasm of breast: Secondary | ICD-10-CM | POA: Diagnosis not present

## 2021-01-10 DIAGNOSIS — E2839 Other primary ovarian failure: Secondary | ICD-10-CM | POA: Diagnosis not present

## 2021-01-10 DIAGNOSIS — M8589 Other specified disorders of bone density and structure, multiple sites: Secondary | ICD-10-CM | POA: Diagnosis not present

## 2021-01-10 NOTE — Telephone Encounter (Signed)
Called pt no answer. Left detailed message informing pt of the information below. Advised pt to call back to schedule lab appt.

## 2021-01-10 NOTE — Telephone Encounter (Signed)
-----   Message from Billey Co, MD sent at 01/10/2021  3:25 PM EDT ----- Regarding: microscopic hematuria Her UA came back with a small amount of microscopic blood- would recommend lab visit for repeat UA in 2-3 weeks and consider cystoscopy if persistent microscopic hematuria(Recent CT normal)  Nickolas Madrid, MD 01/10/2021

## 2021-01-11 ENCOUNTER — Telehealth: Payer: Self-pay | Admitting: Urology

## 2021-01-11 NOTE — Telephone Encounter (Signed)
Pt returned call to schedule lab appt

## 2021-01-14 ENCOUNTER — Telehealth: Payer: Self-pay

## 2021-01-14 NOTE — Telephone Encounter (Signed)
Pt called in asking for results to her dexa and mammogram. Pt states that she does not have access to her mychart at this time.  I relayed results to both tests, per Dr. Einar Pheasant. Pt stated understanding.

## 2021-01-24 ENCOUNTER — Other Ambulatory Visit: Payer: Self-pay

## 2021-01-24 DIAGNOSIS — R3129 Other microscopic hematuria: Secondary | ICD-10-CM

## 2021-01-25 ENCOUNTER — Emergency Department: Payer: Medicare HMO

## 2021-01-25 ENCOUNTER — Encounter: Payer: Self-pay | Admitting: Emergency Medicine

## 2021-01-25 ENCOUNTER — Other Ambulatory Visit: Payer: Self-pay

## 2021-01-25 ENCOUNTER — Inpatient Hospital Stay
Admission: EM | Admit: 2021-01-25 | Discharge: 2021-01-28 | DRG: 389 | Disposition: A | Discharge status: Home / Self Care | Payer: Medicare HMO | Attending: Internal Medicine | Admitting: Internal Medicine

## 2021-01-25 ENCOUNTER — Other Ambulatory Visit: Payer: Medicare HMO

## 2021-01-25 ENCOUNTER — Ambulatory Visit: Payer: Medicare HMO | Admitting: Gastroenterology

## 2021-01-25 DIAGNOSIS — E872 Acidosis, unspecified: Secondary | ICD-10-CM

## 2021-01-25 DIAGNOSIS — K5651 Intestinal adhesions [bands], with partial obstruction: Secondary | ICD-10-CM | POA: Diagnosis present

## 2021-01-25 DIAGNOSIS — Z90710 Acquired absence of both cervix and uterus: Secondary | ICD-10-CM

## 2021-01-25 DIAGNOSIS — R188 Other ascites: Secondary | ICD-10-CM | POA: Diagnosis present

## 2021-01-25 DIAGNOSIS — Z79899 Other long term (current) drug therapy: Secondary | ICD-10-CM | POA: Diagnosis not present

## 2021-01-25 DIAGNOSIS — K56600 Partial intestinal obstruction, unspecified as to cause: Secondary | ICD-10-CM | POA: Diagnosis present

## 2021-01-25 DIAGNOSIS — J984 Other disorders of lung: Secondary | ICD-10-CM | POA: Diagnosis not present

## 2021-01-25 DIAGNOSIS — R112 Nausea with vomiting, unspecified: Secondary | ICD-10-CM | POA: Diagnosis present

## 2021-01-25 DIAGNOSIS — E860 Dehydration: Secondary | ICD-10-CM

## 2021-01-25 DIAGNOSIS — Z8249 Family history of ischemic heart disease and other diseases of the circulatory system: Secondary | ICD-10-CM

## 2021-01-25 DIAGNOSIS — D72829 Elevated white blood cell count, unspecified: Secondary | ICD-10-CM | POA: Diagnosis present

## 2021-01-25 DIAGNOSIS — K6389 Other specified diseases of intestine: Secondary | ICD-10-CM | POA: Diagnosis not present

## 2021-01-25 DIAGNOSIS — R109 Unspecified abdominal pain: Secondary | ICD-10-CM | POA: Diagnosis not present

## 2021-01-25 DIAGNOSIS — Z20822 Contact with and (suspected) exposure to covid-19: Secondary | ICD-10-CM | POA: Diagnosis present

## 2021-01-25 DIAGNOSIS — R1084 Generalized abdominal pain: Secondary | ICD-10-CM | POA: Diagnosis not present

## 2021-01-25 DIAGNOSIS — Z8042 Family history of malignant neoplasm of prostate: Secondary | ICD-10-CM | POA: Diagnosis not present

## 2021-01-25 DIAGNOSIS — F32A Depression, unspecified: Secondary | ICD-10-CM | POA: Diagnosis present

## 2021-01-25 DIAGNOSIS — Z8542 Personal history of malignant neoplasm of other parts of uterus: Secondary | ICD-10-CM

## 2021-01-25 DIAGNOSIS — Z833 Family history of diabetes mellitus: Secondary | ICD-10-CM

## 2021-01-25 DIAGNOSIS — K56609 Unspecified intestinal obstruction, unspecified as to partial versus complete obstruction: Secondary | ICD-10-CM | POA: Diagnosis not present

## 2021-01-25 DIAGNOSIS — K5939 Other megacolon: Secondary | ICD-10-CM | POA: Diagnosis not present

## 2021-01-25 DIAGNOSIS — Z978 Presence of other specified devices: Secondary | ICD-10-CM

## 2021-01-25 DIAGNOSIS — E785 Hyperlipidemia, unspecified: Secondary | ICD-10-CM | POA: Diagnosis present

## 2021-01-25 DIAGNOSIS — I10 Essential (primary) hypertension: Secondary | ICD-10-CM | POA: Diagnosis present

## 2021-01-25 DIAGNOSIS — K5989 Other specified functional intestinal disorders: Secondary | ICD-10-CM | POA: Diagnosis not present

## 2021-01-25 DIAGNOSIS — K219 Gastro-esophageal reflux disease without esophagitis: Secondary | ICD-10-CM | POA: Diagnosis present

## 2021-01-25 DIAGNOSIS — Z9049 Acquired absence of other specified parts of digestive tract: Secondary | ICD-10-CM

## 2021-01-25 DIAGNOSIS — M810 Age-related osteoporosis without current pathological fracture: Secondary | ICD-10-CM | POA: Diagnosis present

## 2021-01-25 DIAGNOSIS — R000 Tachycardia, unspecified: Secondary | ICD-10-CM | POA: Diagnosis not present

## 2021-01-25 DIAGNOSIS — Z4682 Encounter for fitting and adjustment of non-vascular catheter: Secondary | ICD-10-CM | POA: Diagnosis not present

## 2021-01-25 LAB — CBC
HCT: 42.7 % (ref 36.0–46.0)
Hemoglobin: 15.2 g/dL — ABNORMAL HIGH (ref 12.0–15.0)
MCH: 33.5 pg (ref 26.0–34.0)
MCHC: 35.6 g/dL (ref 30.0–36.0)
MCV: 94.1 fL (ref 80.0–100.0)
Platelets: 236 10*3/uL (ref 150–400)
RBC: 4.54 MIL/uL (ref 3.87–5.11)
RDW: 13.6 % (ref 11.5–15.5)
WBC: 13.6 10*3/uL — ABNORMAL HIGH (ref 4.0–10.5)
nRBC: 0 % (ref 0.0–0.2)

## 2021-01-25 LAB — LACTIC ACID, PLASMA: Lactic Acid, Venous: 3.7 mmol/L (ref 0.5–1.9)

## 2021-01-25 LAB — URINALYSIS, COMPLETE (UACMP) WITH MICROSCOPIC
Bacteria, UA: NONE SEEN
Bilirubin Urine: NEGATIVE
Glucose, UA: NEGATIVE mg/dL
Ketones, ur: 20 mg/dL — AB
Leukocytes,Ua: NEGATIVE
Nitrite: NEGATIVE
Protein, ur: 30 mg/dL — AB
RBC / HPF: >50 RBC/hpf — ABNORMAL HIGH (ref 0–5)
Specific Gravity, Urine: 1.021 (ref 1.005–1.030)
pH: 7 (ref 5.0–8.0)

## 2021-01-25 LAB — PROTIME-INR
INR: 1 (ref 0.8–1.2)
Prothrombin Time: 13.2 seconds (ref 11.4–15.2)

## 2021-01-25 LAB — COMPREHENSIVE METABOLIC PANEL
ALT: 20 U/L (ref 0–44)
AST: 33 U/L (ref 15–41)
Albumin: 4.3 g/dL (ref 3.5–5.0)
Alkaline Phosphatase: 62 U/L (ref 38–126)
Anion gap: 13 (ref 5–15)
BUN: 21 mg/dL (ref 8–23)
CO2: 23 mmol/L (ref 22–32)
Calcium: 9.5 mg/dL (ref 8.9–10.3)
Chloride: 101 mmol/L (ref 98–111)
Creatinine, Ser: 1.04 mg/dL — ABNORMAL HIGH (ref 0.44–1.00)
GFR, Estimated: 59 mL/min — ABNORMAL LOW (ref >60)
Glucose, Bld: 185 mg/dL — ABNORMAL HIGH (ref 70–99)
Potassium: 3.5 mmol/L (ref 3.5–5.1)
Sodium: 137 mmol/L (ref 135–145)
Total Bilirubin: 1.8 mg/dL — ABNORMAL HIGH (ref 0.3–1.2)
Total Protein: 7.9 g/dL (ref 6.5–8.1)

## 2021-01-25 LAB — APTT: aPTT: 24 seconds (ref 24–36)

## 2021-01-25 LAB — TROPONIN I (HIGH SENSITIVITY): Troponin I (High Sensitivity): 5 ng/L (ref <18)

## 2021-01-25 LAB — LIPASE, BLOOD: Lipase: 32 U/L (ref 11–51)

## 2021-01-25 MED ORDER — MORPHINE SULFATE (PF) 4 MG/ML IV SOLN
4.0000 mg | Freq: Once | INTRAVENOUS | Status: AC
  Administered 2021-01-25: 4 mg via INTRAVENOUS
  Filled 2021-01-25: qty 1

## 2021-01-25 MED ORDER — IOHEXOL 350 MG/ML SOLN
75.0000 mL | Freq: Once | INTRAVENOUS | Status: AC | PRN
  Administered 2021-01-25: 75 mL via INTRAVENOUS

## 2021-01-25 MED ORDER — ONDANSETRON 4 MG PO TBDP
4.0000 mg | ORAL_TABLET | Freq: Once | ORAL | Status: AC | PRN
  Administered 2021-01-25: 4 mg via ORAL
  Filled 2021-01-25: qty 1

## 2021-01-25 MED ORDER — LACTATED RINGERS IV BOLUS
1000.0000 mL | Freq: Once | INTRAVENOUS | Status: AC
  Administered 2021-01-25: 1000 mL via INTRAVENOUS

## 2021-01-25 NOTE — ED Provider Notes (Signed)
 [facility] Emergency Department Provider Note  ____________________________________________   Event Date/Time   First MD Initiated Contact with Patient 01/25/21 2210     (approximate)  I have reviewed the triage vital signs and the nursing notes.   HISTORY  Chief Complaint Nausea   HPI Valerie [name] is a 70 y.o. female with past medical history of adenocarcinoma of the endometrium, HTN, HDL, osteopenia and GAD who presents accompanied by her husband for assessment of some epigastric and right-sided abdominal pain associate with some burning with urination and nonbloody nonbilious vomiting that seems at all started this morning.  Patient states she is been having regular bowel movements and denies any diarrhea or constipation.  She denies any cough, chest pain, shortness of breath, fevers, headache, earache, sore throat or rash.  Denies any blood in her urine or stool.  Denies any recent injuries or falls.  She does state he feels like abdominal pain is rating to her back.  She is status post cholecystectomy.  She denies any other acute concerns at this time.         Past Medical History:  Diagnosis Date   Acid reflux    Adenocarcinoma of the endometrium/uterus (HCC) 12/04/2000   Anal fissure    Atrophic vaginitis    Difficult intubation    limited neck mobility   Esophagitis    Hernia    Present to proximal to umbillical    History of hiatal hernia    Hyperlipidemia    Hypertension    Internal hemorrhoids    Menorrhagia    Osteopenia    PONV (postoperative nausea and vomiting)    Vertigo    Vitamin D deficiency     Patient Active Problem List   Diagnosis Date Noted   Acute cystitis with hematuria 12/08/2020   Microscopic hematuria 12/08/2020   COVID-19 virus infection 09/28/2020   Chronic constipation 08/13/2020   Dysuria 08/13/2020   Encounter for screening colonoscopy    Otalgia of both ears 01/28/2020   Tinnitus of both ears  10/02/2019   ETD (Eustachian tube dysfunction), right 04/29/2019   Weakness of both lower extremities 04/03/2019   Chronic back pain 04/03/2019   Encounter for annual general medical examination with abnormal findings in adult 02/14/2019   Vulvar dermatitis 01/01/2018   Allergic rhinitis 08/22/2017   Neck pain with history of cervical spinal surgery 01/22/2017   Prediabetes 11/16/2016   Hyperlipidemia 11/16/2015   Esophageal reflux    Hemorrhoid 06/30/2015   Atypical chest pain 01/04/2015   Palpitations 01/04/2015   Welcome to Medicare preventive visit 10/09/2014   Benign paroxysmal positional vertigo 08/31/2014   Hernia    Menorrhagia    Osteopenia    Vitamin D deficiency    Adenocarcinoma of the endometrium/uterus (HCC)    Generalized anxiety disorder 01/23/2007   Essential hypertension 01/23/2007    Past Surgical History:  Procedure Laterality Date   24 HOUR PH STUDY N/A 11/29/2015   Procedure: 24 HOUR PH STUDY;  Surgeon: Steven Paul Armbruster, MD;  Location: [facility] ENDOSCOPY;  Service: Gastroenterology;  Laterality: N/A;   ABDOMINAL HYSTERECTOMY  12/04/2000   also bilateral salpingo-oophorectomy   CARDIAC CATHETERIZATION  2006   CARPAL TUNNEL RELEASE  11/20/2011   Procedure: CARPAL TUNNEL RELEASE;  Surgeon: Gary P Cram, MD;  Location: [facility]URO ORS;  Service: Neurosurgery;  Laterality: Left;  LEFT carpal tunnel release   CERVICAL SPINE SURGERY  2013   Cram   CHOLECYSTECTOMY  1997     COLONOSCOPY WITH PROPOFOL N/A 07/27/2020   Procedure: COLONOSCOPY WITH PROPOFOL;  Surgeon: Lucilla Lame, MD;  Location: [facility] ENDOSCOPY;  Service: Endoscopy;  Laterality: N/A;   DILATION AND CURETTAGE OF UTERUS     11/15/2000   ESOPHAGEAL MANOMETRY N/A 09/20/2015   Procedure: ESOPHAGEAL MANOMETRY (EM);  Surgeon: Manus Gunning, MD;  Location: [facility] ENDOSCOPY;  Service: Gastroenterology;  Laterality: N/A;   HYSTEROSCOPY  11/15/2000   D & C, resection of endometrial polyps   OOPHORECTOMY   12/04/2000   bilateral salpingo-oophorectomy done with TAH   SPINAL CORD STIMULATOR INSERTION Bilateral 04/12/2020   Procedure: INSERTION CERVICAL SPINAL STIMULATOR PULSE GENERATOR;  Surgeon: [name], MD;  Location: [facility] ORS;  Service: Neurosurgery;  Laterality: Bilateral;   SPINAL CORD STIMULATOR TRIAL N/A 03/22/2020   Procedure: CERVICAL SPINAL CORD STIMULATOR TRIAL PERCUTANEOUS;  Surgeon: [name], MD;  Location: [facility] ORS;  Service: Neurosurgery;  Laterality: N/A;   TUBAL LIGATION      Prior to Admission medications   Medication Sig Start Date End Date Taking? Authorizing Provider  acetaminophen (TYLENOL) 500 MG tablet Take 500-1,000 mg by mouth every 6 (six) hours as needed (for pain).    [provider]  amLODipine (NORVASC) 2.5 MG tablet Take 2.5 mg by mouth daily. 02/06/20   [provider]  atorvastatin (LIPITOR) 20 MG tablet Take by mouth. 12/15/20 12/15/21  [provider]  DULoxetine (CYMBALTA) 20 MG capsule Take 1 capsule (20 mg total) by mouth daily. For anxiety/depression/pain 08/19/20   Pleas Koch, NP  estradiol (ESTRACE) 0.1 MG/GM vaginal cream Estrogen Cream Instruction Discard applicator Apply pea sized amount to tip of finger to urethra before bed. Wash hands well after application. Use Monday, Wednesday and Friday 12/16/20   Billey Co, MD  losartan (COZAAR) 100 MG tablet Take 1 tablet once daily for blood pressure. 12/13/20   Pleas Koch, NP  metoprolol succinate (TOPROL-XL) 50 MG 24 hr tablet Take 1 tablet (50 mg total) by mouth daily. For heart rate/palpitations. 08/12/20   Pleas Koch, NP  omeprazole (PRILOSEC) 40 MG capsule TAKE 1 CAPSULE (40 MG TOTAL) BY MOUTH DAILY. FOR HEARTBURN. 08/24/20   Pleas Koch, NP    Allergies Patient has no known allergies.  Family History  Problem Relation Age of Onset   Hypertension Father    Prostate cancer Father    Hypertension Sister    Diabetes Brother    Heart  disease Brother    Hypertension Brother    Diabetes Brother    Heart disease Brother    Breast cancer Neg Hx     Social History Social History   Tobacco Use   Smoking status: Never   Smokeless tobacco: Never  Vaping Use   Vaping Use: Never used  Substance Use Topics   Alcohol use: No   Drug use: Never    Review of Systems  Review of Systems  Constitutional:  Negative for chills and fever.  HENT:  Negative for sore throat.   Eyes:  Negative for pain.  Respiratory:  Negative for cough and stridor.   Cardiovascular:  Negative for chest pain.  Gastrointestinal:  Positive for abdominal pain (Radiating to R flank and back), nausea and vomiting.  Genitourinary:  Positive for dysuria and flank pain.  Musculoskeletal:  Negative for myalgias.  Skin:  Negative for rash.  Neurological:  Negative for seizures, loss of consciousness and headaches.  Psychiatric/Behavioral:  Negative for suicidal ideas.   All other systems reviewed and  are negative.    ____________________________________________   PHYSICAL EXAM:  VITAL SIGNS: ED Triage Vitals  Enc Vitals Group     BP 01/25/21 1939 128/88     Pulse Rate 01/25/21 1939 (!) 124     Resp 01/25/21 1939 16     Temp 01/25/21 1939 99.1 F (37.3 C)     Temp Source 01/25/21 1939 Oral     SpO2 01/25/21 1939 96 %     Weight 01/25/21 1941 155 lb (70.3 kg)     Height 01/25/21 1941 5' 5" (1.651 m)     Head Circumference --      Peak Flow --      Pain Score 01/25/21 1941 8     Pain Loc --      Pain Edu? --      Excl. in GC? --    Vitals:   01/25/21 1939 01/25/21 1940  BP: 128/88 128/88  Pulse: (!) 124 (!) 121  Resp: 16 18  Temp: 99.1 F (37.3 C) 99.1 F (37.3 C)  SpO2: 96% 95%   Physical Exam Vitals and nursing note reviewed.  Constitutional:      General: She is not in acute distress.    Appearance: She is well-developed.  HENT:     Head: Normocephalic and atraumatic.     Right Ear: External ear normal.     Left Ear:  External ear normal.     Nose: Nose normal.     Mouth/Throat:     Mouth: Mucous membranes are dry.  Eyes:     Conjunctiva/sclera: Conjunctivae normal.  Cardiovascular:     Rate and Rhythm: Regular rhythm. Tachycardia present.     Heart sounds: No murmur heard. Pulmonary:     Effort: Pulmonary effort is normal. No respiratory distress.     Breath sounds: Normal breath sounds.  Abdominal:     Palpations: Abdomen is soft.     Tenderness: There is abdominal tenderness in the right upper quadrant, right lower quadrant, epigastric area, periumbilical area and suprapubic area. There is right CVA tenderness. There is no left CVA tenderness.  Musculoskeletal:     Cervical back: Neck supple.  Skin:    General: Skin is warm and dry.     Capillary Refill: Capillary refill takes more than 3 seconds.  Neurological:     Mental Status: She is alert and oriented to person, place, and time.  Psychiatric:        Mood and Affect: Mood normal.     ____________________________________________   LABS (all labs ordered are listed, but only abnormal results are displayed)  Labs Reviewed  COMPREHENSIVE METABOLIC PANEL - Abnormal; Notable for the following components:      Result Value   Glucose, Bld 185 (*)    Creatinine, Ser 1.04 (*)    Total Bilirubin 1.8 (*)    GFR, Estimated 59 (*)    All other components within normal limits  CBC - Abnormal; Notable for the following components:   WBC 13.6 (*)    Hemoglobin 15.2 (*)    All other components within normal limits  URINALYSIS, COMPLETE (UACMP) WITH MICROSCOPIC - Abnormal; Notable for the following components:   Color, Urine YELLOW (*)    APPearance CLOUDY (*)    Hgb urine dipstick MODERATE (*)    Ketones, ur 20 (*)    Protein, ur 30 (*)    RBC / HPF >50 (*)    All other components within normal limits  CULTURE, BLOOD (  SINGLE)  URINE CULTURE  LIPASE, BLOOD  LACTIC ACID, PLASMA  PROTIME-INR  APTT  LACTIC ACID, PLASMA  TROPONIN I (HIGH  SENSITIVITY)   ____________________________________________  EKG  Sinus tachycardia with a ventricular rate of 113 with some nonspecific ST changes in inferior and anterior leads specifically some ST depressions in V3, V4 and V5 as well as V6.  No other acute significant derangements.  These changes are similar but slightly more pronounced on ECG obtained on 11/30/2020. ____________________________________________  RADIOLOGY  ED MD interpretation:   Official radiology report(s): No results found.  ____________________________________________   PROCEDURES  Procedure(s) performed (including Critical Care):  Procedures   ____________________________________________   INITIAL IMPRESSION / ASSESSMENT AND PLAN / ED COURSE     Presents with above-stated history and exam for assessment approximately 1 day of some nonbloody nonbilious vomiting, nausea, and abdominal pain rating to the right flank and back as well as some burning with urination.  On arrival she is tachycardic with otherwise stable vital signs on room air.  She does appear extremely dehydrated and is tender in her right upper quadrant, right lower quadrant and the right CVA region.  She is not peritoneal take and has no guarding.  Left CVA areas unremarkable.  Primary differential includes cystitis, pyelonephritis, kidney stone, diverticulitis, pancreatitis, acute infectious gastritis, atypical presentation for ACS and appendicitis.  CMP has some mild hyperglycemia with a glucose of 184 without any other significant electrolyte or metabolic derangements.  No evidence of hepatitis or alk phos elevation in T bili slight elevated 1.8.  Lipase 32.  CBC with WBC count of 13.6 without acute anemia or platelet derangements.  Troponin is nonelevated 5 and given this was obtained greater than 3 hours after symptom onset I have a low suspicion for atypical presentation for ACS despite some nonspecific findings on ECG.  Will obtain CT  abdomen pelvis to assess for other above-noted pathologies in the meantime hydrate with IV fluids and give a dose of analgesia.  Given patient meet SIRS criteria with tachycardia and leukocytosis will obtain lactic acid and blood and urine cultures as well.  Care patient signed over to oncoming provider at approximately 2300.  Plan is to follow-up CT, lactic acid, and reassess.       ____________________________________________   FINAL CLINICAL IMPRESSION(S) / ED DIAGNOSES  Final diagnoses:  Abdominal pain, unspecified abdominal location  Flank pain  Nausea and vomiting, intractability of vomiting not specified, unspecified vomiting type    Medications  iohexol (OMNIPAQUE) 350 MG/ML injection 75 mL (has no administration in time range)  ondansetron (ZOFRAN-ODT) disintegrating tablet 4 mg (4 mg Oral Given 01/25/21 1949)  lactated ringers bolus 1,000 mL (1,000 mLs Intravenous New Bag/Given 01/25/21 2309)  morphine 4 MG/ML injection 4 mg (4 mg Intravenous Given 01/25/21 2310)     ED Discharge Orders     None        Note:  This document was prepared using Dragon voice recognition software and may include unintentional dictation errors.    Smith, Zachary P, MD 01/25/21 2326  

## 2021-01-25 NOTE — ED Triage Notes (Signed)
Pt to ED from home c/o nausea and vomiting since early this morning more than 10 times, upper abd pain, denies diarrhea or fevers.  States pain is aching, denies urinary changes or SOB.  Pt A&Ox4, chest rise even and unlabored, in NAD at this time.

## 2021-01-26 ENCOUNTER — Inpatient Hospital Stay: Payer: Medicare HMO

## 2021-01-26 DIAGNOSIS — Z8042 Family history of malignant neoplasm of prostate: Secondary | ICD-10-CM | POA: Diagnosis not present

## 2021-01-26 DIAGNOSIS — F32A Depression, unspecified: Secondary | ICD-10-CM | POA: Diagnosis present

## 2021-01-26 DIAGNOSIS — R1084 Generalized abdominal pain: Secondary | ICD-10-CM

## 2021-01-26 DIAGNOSIS — E872 Acidosis: Secondary | ICD-10-CM | POA: Diagnosis present

## 2021-01-26 DIAGNOSIS — Z8249 Family history of ischemic heart disease and other diseases of the circulatory system: Secondary | ICD-10-CM | POA: Diagnosis not present

## 2021-01-26 DIAGNOSIS — I10 Essential (primary) hypertension: Secondary | ICD-10-CM | POA: Diagnosis present

## 2021-01-26 DIAGNOSIS — Z90710 Acquired absence of both cervix and uterus: Secondary | ICD-10-CM | POA: Diagnosis not present

## 2021-01-26 DIAGNOSIS — K56600 Partial intestinal obstruction, unspecified as to cause: Secondary | ICD-10-CM | POA: Diagnosis present

## 2021-01-26 DIAGNOSIS — R112 Nausea with vomiting, unspecified: Secondary | ICD-10-CM | POA: Diagnosis present

## 2021-01-26 DIAGNOSIS — M810 Age-related osteoporosis without current pathological fracture: Secondary | ICD-10-CM | POA: Diagnosis present

## 2021-01-26 DIAGNOSIS — R188 Other ascites: Secondary | ICD-10-CM | POA: Diagnosis present

## 2021-01-26 DIAGNOSIS — K219 Gastro-esophageal reflux disease without esophagitis: Secondary | ICD-10-CM | POA: Diagnosis present

## 2021-01-26 DIAGNOSIS — D72829 Elevated white blood cell count, unspecified: Secondary | ICD-10-CM | POA: Diagnosis present

## 2021-01-26 DIAGNOSIS — Z833 Family history of diabetes mellitus: Secondary | ICD-10-CM | POA: Diagnosis not present

## 2021-01-26 DIAGNOSIS — Z9049 Acquired absence of other specified parts of digestive tract: Secondary | ICD-10-CM | POA: Diagnosis not present

## 2021-01-26 DIAGNOSIS — Z20822 Contact with and (suspected) exposure to covid-19: Secondary | ICD-10-CM | POA: Diagnosis present

## 2021-01-26 DIAGNOSIS — E785 Hyperlipidemia, unspecified: Secondary | ICD-10-CM | POA: Diagnosis present

## 2021-01-26 DIAGNOSIS — K5651 Intestinal adhesions [bands], with partial obstruction: Secondary | ICD-10-CM | POA: Diagnosis present

## 2021-01-26 DIAGNOSIS — Z8542 Personal history of malignant neoplasm of other parts of uterus: Secondary | ICD-10-CM | POA: Diagnosis not present

## 2021-01-26 DIAGNOSIS — E860 Dehydration: Secondary | ICD-10-CM | POA: Diagnosis present

## 2021-01-26 DIAGNOSIS — R1013 Epigastric pain: Secondary | ICD-10-CM | POA: Insufficient documentation

## 2021-01-26 DIAGNOSIS — Z79899 Other long term (current) drug therapy: Secondary | ICD-10-CM | POA: Diagnosis not present

## 2021-01-26 HISTORY — DX: Partial intestinal obstruction, unspecified as to cause: K56.600

## 2021-01-26 LAB — CBC
HCT: 39.3 % (ref 36.0–46.0)
Hemoglobin: 13.4 g/dL (ref 12.0–15.0)
MCH: 32.2 pg (ref 26.0–34.0)
MCHC: 34.1 g/dL (ref 30.0–36.0)
MCV: 94.5 fL (ref 80.0–100.0)
Platelets: 189 10*3/uL (ref 150–400)
RBC: 4.16 MIL/uL (ref 3.87–5.11)
RDW: 13.9 % (ref 11.5–15.5)
WBC: 12.2 10*3/uL — ABNORMAL HIGH (ref 4.0–10.5)
nRBC: 0 % (ref 0.0–0.2)

## 2021-01-26 LAB — BASIC METABOLIC PANEL
Anion gap: 8 (ref 5–15)
BUN: 22 mg/dL (ref 8–23)
CO2: 28 mmol/L (ref 22–32)
Calcium: 8.8 mg/dL — ABNORMAL LOW (ref 8.9–10.3)
Chloride: 102 mmol/L (ref 98–111)
Creatinine, Ser: 0.75 mg/dL (ref 0.44–1.00)
GFR, Estimated: >60 mL/min (ref >60)
Glucose, Bld: 121 mg/dL — ABNORMAL HIGH (ref 70–99)
Potassium: 3.9 mmol/L (ref 3.5–5.1)
Sodium: 138 mmol/L (ref 135–145)

## 2021-01-26 LAB — RESP PANEL BY RT-PCR (FLU A&B, COVID) ARPGX2
Influenza A by PCR: NEGATIVE
Influenza B by PCR: NEGATIVE
SARS Coronavirus 2 by RT PCR: NEGATIVE

## 2021-01-26 LAB — LACTIC ACID, PLASMA: Lactic Acid, Venous: 1.3 mmol/L (ref 0.5–1.9)

## 2021-01-26 LAB — HIV ANTIBODY (ROUTINE TESTING W REFLEX): HIV Screen 4th Generation wRfx: NONREACTIVE

## 2021-01-26 MED ORDER — ONDANSETRON HCL 4 MG/2ML IJ SOLN
4.0000 mg | Freq: Four times a day (QID) | INTRAMUSCULAR | Status: DC | PRN
  Administered 2021-01-26 (×3): 4 mg via INTRAVENOUS
  Filled 2021-01-26 (×3): qty 2

## 2021-01-26 MED ORDER — LOSARTAN POTASSIUM 50 MG PO TABS
100.0000 mg | ORAL_TABLET | Freq: Every day | ORAL | Status: DC
  Administered 2021-01-26 – 2021-01-28 (×3): 100 mg via ORAL
  Filled 2021-01-26 (×3): qty 2

## 2021-01-26 MED ORDER — AMLODIPINE BESYLATE 5 MG PO TABS
2.5000 mg | ORAL_TABLET | Freq: Every day | ORAL | Status: DC
  Administered 2021-01-26: 2.5 mg via ORAL
  Filled 2021-01-26: qty 1

## 2021-01-26 MED ORDER — MAGNESIUM HYDROXIDE 400 MG/5ML PO SUSP: 30.0000 mL | Freq: Every day | ORAL | Status: DC | PRN

## 2021-01-26 MED ORDER — ONDANSETRON HCL 4 MG PO TABS: 4.0000 mg | ORAL_TABLET | Freq: Four times a day (QID) | ORAL | Status: DC | PRN

## 2021-01-26 MED ORDER — ATORVASTATIN CALCIUM 20 MG PO TABS
20.0000 mg | ORAL_TABLET | Freq: Every day | ORAL | Status: DC
  Administered 2021-01-26 – 2021-01-28 (×3): 20 mg via ORAL
  Filled 2021-01-26 (×3): qty 1

## 2021-01-26 MED ORDER — METOPROLOL SUCCINATE ER 50 MG PO TB24
50.0000 mg | ORAL_TABLET | Freq: Every day | ORAL | Status: DC
  Administered 2021-01-26 – 2021-01-28 (×3): 50 mg via ORAL
  Filled 2021-01-26 (×3): qty 1

## 2021-01-26 MED ORDER — LACTATED RINGERS IV SOLN: INTRAVENOUS | Status: AC

## 2021-01-26 MED ORDER — POTASSIUM CHLORIDE IN NACL 40-0.9 MEQ/L-% IV SOLN
INTRAVENOUS | Status: DC
  Filled 2021-01-26 (×4): qty 1000

## 2021-01-26 MED ORDER — ACETAMINOPHEN 650 MG RE SUPP: 650.0000 mg | Freq: Four times a day (QID) | RECTAL | Status: DC | PRN

## 2021-01-26 MED ORDER — ACETAMINOPHEN 325 MG PO TABS: 650.0000 mg | ORAL_TABLET | Freq: Four times a day (QID) | ORAL | Status: DC | PRN

## 2021-01-26 MED ORDER — TRAZODONE HCL 50 MG PO TABS: 25.0000 mg | ORAL_TABLET | Freq: Every evening | ORAL | Status: DC | PRN

## 2021-01-26 MED ORDER — ONDANSETRON HCL 4 MG/2ML IJ SOLN
4.0000 mg | Freq: Four times a day (QID) | INTRAMUSCULAR | Status: DC | PRN
Start: 2021-01-26 — End: 2021-01-26
  Administered 2021-01-26: 4 mg via INTRAVENOUS
  Filled 2021-01-26: qty 2

## 2021-01-26 MED ORDER — HYDROMORPHONE HCL 1 MG/ML IJ SOLN
0.5000 mg | INTRAMUSCULAR | Status: DC | PRN
Start: 2021-01-26 — End: 2021-01-27
  Administered 2021-01-26 (×2): 0.5 mg via INTRAVENOUS
  Filled 2021-01-26: qty 0.5
  Filled 2021-01-26: qty 1

## 2021-01-26 MED ORDER — ENOXAPARIN SODIUM 40 MG/0.4ML IJ SOSY
40.0000 mg | PREFILLED_SYRINGE | INTRAMUSCULAR | Status: DC
  Administered 2021-01-26 – 2021-01-28 (×3): 40 mg via SUBCUTANEOUS
  Filled 2021-01-26 (×3): qty 0.4

## 2021-01-26 MED ORDER — DULOXETINE HCL 20 MG PO CPEP
20.0000 mg | ORAL_CAPSULE | Freq: Every day | ORAL | Status: DC
  Administered 2021-01-26 – 2021-01-28 (×3): 20 mg via ORAL
  Filled 2021-01-26 (×3): qty 1

## 2021-01-26 MED ORDER — PANTOPRAZOLE SODIUM 40 MG PO TBEC
40.0000 mg | DELAYED_RELEASE_TABLET | Freq: Every day | ORAL | Status: DC
  Administered 2021-01-26 – 2021-01-28 (×3): 40 mg via ORAL
  Filled 2021-01-26 (×3): qty 1

## 2021-01-26 NOTE — Progress Notes (Signed)
Patient ID: Valerie [name], female   DOB: November 07, 1950, 70 y.o.   MRN: [id number] [facility] PROGRESS NOTE  Valerie [name] [id number] DOB: [birth year] DOA: 01/25/2021 PCP: Pleas Koch, NP  HPI/Subjective: Patient feels a little bit better.  Positive for nausea but no vomiting.  Did have vomiting prior to coming into the hospital.  Still having some epigastric pain.  No bowel movement yet but is passing gas.  Objective: Vitals:   01/26/21 0930 01/26/21 1255  BP: 120/77 120/75  Pulse: 64 67  Resp: 12 16  Temp:    SpO2: 95% 95%   No intake or output data in the 24 hours ending 01/26/21 1400 Filed Weights   01/25/21 1941  Weight: 70.3 kg    ROS: Review of Systems  Respiratory:  Negative for shortness of breath.   Cardiovascular:  Negative for chest pain.  Gastrointestinal:  Positive for abdominal pain, constipation and nausea. Negative for vomiting.  Exam: Physical Exam HENT:     Head: Normocephalic.     Mouth/Throat:     Pharynx: No oropharyngeal exudate.  Eyes:     General: Lids are normal.     Conjunctiva/sclera: Conjunctivae normal.  Cardiovascular:     Rate and Rhythm: Normal rate and regular rhythm.     Heart sounds: Normal heart sounds, S1 normal and S2 normal.  Pulmonary:     Breath sounds: No decreased breath sounds, wheezing, rhonchi or rales.  Abdominal:     Palpations: Abdomen is soft.     Tenderness: There is abdominal tenderness in the right upper quadrant and epigastric area.  Musculoskeletal:     Right lower leg: No swelling.     Left lower leg: No swelling.  Skin:    General: Skin is warm.     Findings: No rash.  Neurological:     Mental Status: She is alert and oriented to person, place, and time.      Scheduled Meds:  amLODipine  2.5 mg Oral Daily   atorvastatin  20 mg Oral Daily   DULoxetine  20 mg Oral Daily   enoxaparin (LOVENOX) injection  40 mg Subcutaneous Q24H   losartan  100 mg Oral Daily   metoprolol succinate  50 mg Oral  Daily   pantoprazole  40 mg Oral Daily   Continuous Infusions:  0.9 % NaCl with KCl 40 mEq / L 100 mL/hr at 01/26/21 0250    Assessment/Plan:  Partial small bowel obstruction.  Appreciate general surgery consultation.  Patient is n.p.o. and on IV fluids.  Empiric Protonix.  If any further vomiting can consider NG tube. Essential hypertension on Norvasc, Cozaar and metoprolol Hyperlipidemia unspecified on Lipitor Depression on Cymbalta    Code Status:     Code Status Orders  (From admission, onward)           Start     Ordered   01/26/21 0218  Full code  Continuous        01/26/21 0223           Code Status History     This patient has a current code status but no historical code status.      Family Communication: Spoke with husband at the bedside Disposition Plan: Status is: Inpatient  Dispo: The patient is from: Home              Anticipated d/c is to: Home              Patient currently still  n.p.o. patient treated for small bowel obstruction.   Difficult to place patient.  No.  Consultants: General surgery  Time spent: 31 minutes.  [place]  [facility] [employer]

## 2021-01-26 NOTE — H&P (Signed)
[place]   PATIENT NAME: Valerie [name]    MR#:  [id number]  DATE OF BIRTH:  01/19/1951  DATE OF ADMISSION:  01/25/2021  PRIMARY CARE PHYSICIAN: Pleas Koch, NP   Patient is coming from: Home  REQUESTING/REFERRING PHYSICIAN: Naaman Plummer, MD  CHIEF COMPLAINT:   Chief Complaint  Patient presents with  . Nausea    HISTORY OF PRESENT ILLNESS:  Valerie [name] is a 70 y.o. Caucasian female with medical history significant for GERD, hypertension, dyslipidemia and osteoporosis, who presented to the emergency room with acute onset of epigastric and right sided abdominal pain with associated vomiting that started this morning as well as mild dysuria.  She denies any bilious vomitus or hematemesis.  No diarrhea or melena or bright red bleeding per rectum.  She is able to pass flatus though.  No chest pain or dyspnea or cough.  No fever or chills.  No bleeding diathesis.  ED Course: Upon position to the emergency room Vital signs were within normal.  Labs revealed borderline potassium of 3.5 and serum lipase was 32 and total bili was 1.8 with normal other LFTs.  High-sensitivity troponin was 5 and lactic acid was 3.7.  CBC showed leukocytosis 13.6.  INR was 1 with PT 13.2 and PTT 24.  Influenza antigens and COVID-19 PCR came back negative.  Should the pyuria of 11-20 and more than 50 RBCs.  Blood culture was drawn as well as urine culture. EKG as reviewed by me : EKG showed sinus tachycardia with rate 113. Imaging: Abdominal and pelvic CT scan revealed dilated loops of small bowel in the left mid abdomen with transition point in the right mid abdomen suggesting at least partial small bowel obstruction.  It showed small volume pelvic ascites with no free air.  The patient was given 1 L bolus of IV lactated Ringer followed by 75 mill per hour, 4 mg of IV morphine sulfate and 4 mg IV Zofran twice.  She will be admitted to a medical bed for further evaluation and management.Marland Kitchen PAST MEDICAL  HISTORY:   Past Medical History:  Diagnosis Date  . Acid reflux   . Adenocarcinoma of the endometrium/uterus ([place]) 12/04/2000  . Anal fissure   . Atrophic vaginitis   . Difficult intubation    limited neck mobility  . Esophagitis   . Hernia    Present to proximal to umbillical   . History of hiatal hernia   . Hyperlipidemia   . Hypertension   . Internal hemorrhoids   . Menorrhagia   . Osteopenia   . PONV (postoperative nausea and vomiting)   . Vertigo   . Vitamin D deficiency     PAST SURGICAL HISTORY:   Past Surgical History:  Procedure Laterality Date  . [place] STUDY N/A 11/29/2015   Procedure: [place] STUDY;  Surgeon: Manus Gunning, MD;  Location: [facility];  Service: Gastroenterology;  Laterality: N/A;  . ABDOMINAL HYSTERECTOMY  12/04/2000   also bilateral salpingo-oophorectomy  . CARDIAC CATHETERIZATION  2006  . CARPAL TUNNEL RELEASE  11/20/2011   Procedure: CARPAL TUNNEL RELEASE;  Surgeon: Elaina Hoops, MD;  Location: [place] [facility];  Service: Neurosurgery;  Laterality: Left;  LEFT carpal tunnel release  . CERVICAL SPINE SURGERY  2013   [place]  . COLONOSCOPY WITH PROPOFOL N/A 07/27/2020   Procedure: COLONOSCOPY WITH PROPOFOL;  Surgeon: Lucilla Lame, MD;  Location: [facility] ENDOSCOPY;  Service: Endoscopy;  Laterality: N/A;  . DILATION AND CURETTAGE OF UTERUS     11/15/2000  . ESOPHAGEAL MANOMETRY N/A 09/20/2015   Procedure: ESOPHAGEAL MANOMETRY (EM);  Surgeon: Manus Gunning, MD;  Location: [facility];  Service: Gastroenterology;  Laterality: N/A;  . HYSTEROSCOPY  11/15/2000   D & C, resection of endometrial polyps  . OOPHORECTOMY  12/04/2000   bilateral salpingo-oophorectomy done with TAH  . SPINAL CORD STIMULATOR INSERTION Bilateral 04/12/2020   Procedure: INSERTION CERVICAL SPINAL STIMULATOR PULSE GENERATOR;  Surgeon: [name], MD;  Location: [facility] ORS;  Service: Neurosurgery;  Laterality: Bilateral;  . SPINAL CORD  STIMULATOR TRIAL N/A 03/22/2020   Procedure: CERVICAL SPINAL CORD STIMULATOR TRIAL PERCUTANEOUS;  Surgeon: [name], MD;  Location: [facility] ORS;  Service: Neurosurgery;  Laterality: N/A;  . TUBAL LIGATION      SOCIAL HISTORY:   Social History   Tobacco Use  . Smoking status: Never  . Smokeless tobacco: Never  Substance Use Topics  . Alcohol use: No    FAMILY HISTORY:   Family History  Problem Relation Age of Onset  . Hypertension Father   . Prostate cancer Father   . Hypertension Sister   . Diabetes Brother   . Heart disease Brother   . Hypertension Brother   . Diabetes Brother   . Heart disease Brother   . Breast cancer Neg Hx     DRUG ALLERGIES:  No Known Allergies  REVIEW OF SYSTEMS:   ROS As per history of present illness. All pertinent systems were reviewed above. Constitutional, HEENT, cardiovascular, respiratory, GI, GU, musculoskeletal, neuro, psychiatric, endocrine, integumentary and hematologic systems were reviewed and are otherwise negative/unremarkable except for positive findings mentioned above in the HPI.   MEDICATIONS AT HOME:   Prior to Admission medications   Medication Sig Start Date End Date Taking? Authorizing Provider  acetaminophen (TYLENOL) 500 MG tablet Take 500-1,000 mg by mouth every 6 (six) hours as needed (for pain).   Yes [provider]  amLODipine (NORVASC) 2.5 MG tablet Take 2.5 mg by mouth daily. 02/06/20  Yes [provider]  atorvastatin (LIPITOR) 20 MG tablet Take 20 mg by mouth daily. 12/15/20 12/15/21 Yes [provider]  DULoxetine (CYMBALTA) 20 MG capsule Take 1 capsule (20 mg total) by mouth daily. For anxiety/depression/pain 08/19/20  Yes Pleas Koch, NP  estradiol (ESTRACE) 0.1 MG/GM vaginal cream Estrogen Cream Instruction Discard applicator Apply pea sized amount to tip of finger to urethra before bed. Wash hands well after application. Use Monday, Wednesday and Friday 12/16/20  Yes Billey Co, MD  losartan (COZAAR) 100 MG tablet Take 1 tablet once daily for blood pressure. 12/13/20  Yes Pleas Koch, NP  metoprolol succinate (TOPROL-XL) 50 MG 24 hr tablet Take 1 tablet (50 mg total) by mouth daily. For heart rate/palpitations. 08/12/20  Yes Pleas Koch, NP  omeprazole (PRILOSEC) 40 MG capsule TAKE 1 CAPSULE (40 MG TOTAL) BY MOUTH DAILY. FOR HEARTBURN. 08/24/20  Yes Pleas Koch, NP      VITAL SIGNS:  Blood pressure 102/74, pulse 75, temperature 99.1 F (37.3 C), temperature source Oral, resp. rate 14, height '5\' 5"'$  (1.651 m), weight 70.3 kg, SpO2 91 %.  PHYSICAL EXAMINATION:  Physical Exam  GENERAL:  70 y.o.-year-old Caucasian female patient lying in the bed with no acute distress.  EYES: Pupils equal, round, reactive to light and accommodation. No scleral icterus. Extraocular muscles intact.  HEENT: Head atraumatic, normocephalic. Oropharynx and nasopharynx clear.  NECK:  Supple, no jugular venous distention. No thyroid enlargement, no tenderness.  LUNGS: Normal breath sounds bilaterally, no wheezing, rales,rhonchi or crepitation. No use of accessory muscles of respiration.  CARDIOVASCULAR: Regular rate and rhythm, S1, S2 normal. No murmurs, rubs, or gallops.  ABDOMEN: Soft, nondistended, with epigastric and right upper quadrant abdominal tenderness without rebound tenderness guarding or rigidity.. Bowel sounds present. No organomegaly or mass.  EXTREMITIES: No pedal edema, cyanosis, or clubbing.  NEUROLOGIC: Cranial nerves II through XII are intact. Muscle strength 5/5 in all extremities. Sensation intact. Gait not checked.  PSYCHIATRIC: The patient is alert and oriented x 3.  Normal affect and good eye contact. SKIN: No obvious rash, lesion, or ulcer.   LABORATORY PANEL:   CBC Recent Labs  Lab 01/25/21 1946  WBC 13.6*  HGB 15.2*  HCT 42.7  PLT 236    ------------------------------------------------------------------------------------------------------------------  Chemistries  Recent Labs  Lab 01/25/21 1946  NA 137  K 3.5  CL 101  CO2 23  GLUCOSE 185*  BUN 21  CREATININE 1.04*  CALCIUM 9.5  AST 33  ALT 20  ALKPHOS 62  BILITOT 1.8*   ------------------------------------------------------------------------------------------------------------------  Cardiac Enzymes No results for input(s): TROPONINI in the last 168 hours. ------------------------------------------------------------------------------------------------------------------  RADIOLOGY:  CT ABDOMEN PELVIS W CONTRAST  Result Date: 01/26/2021 CLINICAL DATA:  Abdominal pain, nausea/vomiting EXAM: CT ABDOMEN AND PELVIS WITH CONTRAST TECHNIQUE: Multidetector CT imaging of the abdomen and pelvis was performed using the standard protocol following bolus administration of intravenous contrast. CONTRAST:  71m OMNIPAQUE IOHEXOL 350 MG/ML SOLN COMPARISON:  11/30/2020 FINDINGS: Lower chest: Lung bases are clear. Hepatobiliary: Liver is within normal limits. Status post cholecystectomy. No intrahepatic ductal dilatation. Dilated common duct, measuring 10 mm. Pancreas: Within normal limits. Spleen: Within normal limits. Adrenals/Urinary Tract: Adrenal glands are within normal limits. Kidneys are within normal limits.  No hydronephrosis. Bladder is within normal limits. Stomach/Bowel: Stomach is within normal limits. Multiple dilated loops of small bowel in the left mid abdomen, suggesting small bowel obstruction, with transition point in the right mid abdomen (coronal image 23). Normal appendix (series 2/image 67). Right colon is within normal limits.  Left colon is decompressed. Vascular/Lymphatic: No evidence of abdominal aortic aneurysm. Atherosclerotic calcifications of the abdominal aorta and branch vessels. No suspicious abdominopelvic lymphadenopathy. Reproductive: Status post  hysterectomy. No adnexal masses. Other: Small volume pelvic ascites. No free air. Musculoskeletal: Degenerative changes of the visualized thoracolumbar spine. IMPRESSION: Dilated loops of small bowel in the left mid abdomen, with transition point in the right mid abdomen, suggesting at least partial small bowel obstruction. Small volume pelvic ascites.  No free air. Electronically Signed   By: SJulian HyM.D.   On: 01/26/2021 00:03      IMPRESSION AND PLAN:  Active Problems:   Partial small bowel obstruction ([place]  1.  Partial small bowel obstruction. - The patient be admitted to a medical bed. - Should be kept NPO. - She will be hydrated with IV normal saline with added potassium chloride to optimize her potassium. - We will check magnesium level. - We will obtain a two-view abdomen x-ray for follow-up in AM. - General surgery consult will be obtained. - Dr. PDahlia Byeswas notified about the patient.  2.  Essential hypertension. - We will continue Norvasc and Cozaar as well as Toprol-XL.  3.  Dyslipidemia. - We will continue statin therapy.  4.  Depression. - We will continue Cymbalta.  5.  GERD. - We will continue PPI therapy.  DVT prophylaxis: Lovenox.  Code Status: full code. Family Communication:  The plan of care was discussed in details with the patient (and family). I answered all questions. The patient agreed to proceed with the above mentioned plan. Further management will depend upon hospital course. Disposition Plan: Back to previous home environment Consults called: General surgery. All the records are reviewed and case discussed with ED provider.  Status is: Inpatient  Remains inpatient appropriate because:Ongoing active pain requiring inpatient pain management, Ongoing diagnostic testing needed not appropriate for outpatient work up, Unsafe d/c plan, IV treatments appropriate due to intensity of illness or inability to take PO, and Inpatient level of care  appropriate due to severity of illness  Dispo: The patient is from: Home              Anticipated d/c is to: Home              Patient currently is not medically stable to d/c.   Difficult to place patient No   TOTAL TIME TAKING CARE OF THIS PATIENT: 55 minutes.    Christel Mormon M.D on 01/26/2021 at 2:27 AM  [facility]   From 7 PM-7 AM, contact night-coverage [email or web address]  CC: Primary care physician; Pleas Koch, NP

## 2021-01-26 NOTE — Consult Note (Signed)
[facility] SURGICAL CONSULTATION NOTE (initial) - [id number]   HISTORY OF PRESENT ILLNESS (HPI):  70 y.o. female presented to [facility] ED overnight for evaluation of nausea. Patient reports on Monday night she started to not feel well. Yesterday, she started to notice upper abdominal discomfort and multiple episodes of nausea and emesis. She reports that she would feel better after throwing up but the pain and nausea would return quickly. No history of similar in the past. No fever, chills, cough, SOB, CP, or urinary changes, Last BM was yesterday. She reports she is passing some flatus, but less than her usual amount. Previous abdominal surgeries are positive for abdominal hysterectomy and laparoscopic cholecystectomy. Work up in the ED revealed a mild AKI with sCr - 1.04 (now resolved) and mild leukocytosis to 13.6K (12.2K), but labs were otherwise reassuring, She did have CT Abdomen/Pelvis which was concerning for partial vs developing SBO.   Surgery is consulted by emergency medicine physician Dr. Valora Piccolo, MD in this context for evaluation and management of pSBO.  PAST MEDICAL HISTORY (PMH):  Past Medical History:  Diagnosis Date   Acid reflux    Adenocarcinoma of the endometrium/uterus ([place]) 12/04/2000   Anal fissure    Atrophic vaginitis    Difficult intubation    limited neck mobility   Esophagitis    Hernia    Present to proximal to umbillical    History of hiatal hernia    Hyperlipidemia    Hypertension    Internal hemorrhoids    Menorrhagia    Osteopenia    PONV (postoperative nausea and vomiting)    Vertigo    Vitamin D deficiency      PAST SURGICAL HISTORY ([place]):  Past Surgical History:  Procedure Laterality Date   5 HOUR [place] STUDY N/A 11/29/2015   Procedure: 24 HOUR [place] STUDY;  Surgeon: Manus Gunning, MD;  Location: [facility];  Service: Gastroenterology;  Laterality: N/A;   ABDOMINAL HYSTERECTOMY  12/04/2000   also bilateral  salpingo-oophorectomy   CARDIAC CATHETERIZATION  2006   CARPAL TUNNEL RELEASE  11/20/2011   Procedure: CARPAL TUNNEL RELEASE;  Surgeon: Elaina Hoops, MD;  Location: [place] [facility];  Service: Neurosurgery;  Laterality: Left;  LEFT carpal tunnel release   CERVICAL SPINE SURGERY  2013   [place]   COLONOSCOPY WITH PROPOFOL N/A 07/27/2020   Procedure: COLONOSCOPY WITH PROPOFOL;  Surgeon: Lucilla Lame, MD;  Location: [facility];  Service: Endoscopy;  Laterality: N/A;   DILATION AND CURETTAGE OF UTERUS     11/15/2000   ESOPHAGEAL MANOMETRY N/A 09/20/2015   Procedure: ESOPHAGEAL MANOMETRY (EM);  Surgeon: Manus Gunning, MD;  Location: [facility];  Service: Gastroenterology;  Laterality: N/A;   HYSTEROSCOPY  11/15/2000   D & C, resection of endometrial polyps   OOPHORECTOMY  12/04/2000   bilateral salpingo-oophorectomy done with TAH   SPINAL CORD STIMULATOR INSERTION Bilateral 04/12/2020   Procedure: INSERTION CERVICAL SPINAL STIMULATOR PULSE GENERATOR;  Surgeon: [name], MD;  Location: [facility] ORS;  Service: Neurosurgery;  Laterality: Bilateral;   SPINAL CORD STIMULATOR TRIAL N/A 03/22/2020   Procedure: CERVICAL SPINAL CORD STIMULATOR TRIAL PERCUTANEOUS;  Surgeon: [name], MD;  Location: [facility] ORS;  Service: Neurosurgery;  Laterality: N/A;   TUBAL LIGATION       MEDICATIONS:  Prior to Admission medications   Medication Sig Start Date End Date Taking? Authorizing Provider  acetaminophen (TYLENOL) 500 MG tablet Take 500-1,000 mg by mouth every 6 (six)  hours as needed (for pain).   Yes [provider]  amLODipine (NORVASC) 2.5 MG tablet Take 2.5 mg by mouth daily. 02/06/20  Yes [provider]  atorvastatin (LIPITOR) 20 MG tablet Take 20 mg by mouth daily. 12/15/20 12/15/21 Yes [provider]  DULoxetine (CYMBALTA) 20 MG capsule Take 1 capsule (20 mg total) by mouth daily. For anxiety/depression/pain 08/19/20  Yes Pleas Koch, NP   estradiol (ESTRACE) 0.1 MG/GM vaginal cream Estrogen Cream Instruction Discard applicator Apply pea sized amount to tip of finger to urethra before bed. Wash hands well after application. Use Monday, Wednesday and Friday 12/16/20  Yes Billey Co, MD  losartan (COZAAR) 100 MG tablet Take 1 tablet once daily for blood pressure. 12/13/20  Yes Pleas Koch, NP  metoprolol succinate (TOPROL-XL) 50 MG 24 hr tablet Take 1 tablet (50 mg total) by mouth daily. For heart rate/palpitations. 08/12/20  Yes Pleas Koch, NP  omeprazole (PRILOSEC) 40 MG capsule TAKE 1 CAPSULE (40 MG TOTAL) BY MOUTH DAILY. FOR HEARTBURN. 08/24/20  Yes Pleas Koch, NP     ALLERGIES:  No Known Allergies   SOCIAL HISTORY:  Social History   Socioeconomic History   Marital status: Married    Spouse name: Not on file   Number of children: Not on file   Years of education: Not on file   Highest education level: Not on file  Occupational History   Not on file  Tobacco Use   Smoking status: Never   Smokeless tobacco: Never  Vaping Use   Vaping Use: Never used  Substance and Sexual Activity   Alcohol use: No   Drug use: Never   Sexual activity: Yes    Birth control/protection: Post-menopausal, Surgical  Other Topics Concern   Not on file  Social History Narrative   Married   Lives in [place]   Has 2 children, 4 grandchildren   Enjoys walking, shopping.   Social Determinants of Health   Financial Resource Strain: Not on file  Food Insecurity: Not on file  Transportation Needs: Not on file  Physical Activity: Not on file  Stress: Not on file  Social Connections: Not on file  Intimate Partner Violence: Not on file     FAMILY HISTORY:  Family History  Problem Relation Age of Onset   Hypertension Father    Prostate cancer Father    Hypertension Sister    Diabetes Brother    Heart disease Brother    Hypertension Brother    Diabetes Brother    Heart disease Brother    Breast cancer  Neg Hx       REVIEW OF SYSTEMS:  Review of Systems  Constitutional:  Negative for chills and fever.  HENT:  Negative for congestion and sore throat.   Respiratory:  Negative for cough and shortness of breath.   Cardiovascular:  Negative for chest pain and palpitations.  Gastrointestinal:  Positive for abdominal pain, nausea and vomiting. Negative for constipation and diarrhea.  Genitourinary:  Negative for dysuria and urgency.  All other systems reviewed and are negative.  VITAL SIGNS:  Temp:  [99.1 F (37.3 C)] 99.1 F (37.3 C) (08/30 1940) Pulse Rate:  [67-124] 73 (08/31 0800) Resp:  [11-19] 16 (08/31 0800) BP: (102-137)/(72-88) 125/83 (08/31 0800) SpO2:  [90 %-96 %] 93 % (08/31 0800) Weight:  [70.3 kg] 70.3 kg (08/30 1941)     Height: '5\' 5"'$  (165.1 cm) Weight: 70.3 kg BMI (Calculated): 25.79   INTAKE/OUTPUT:  No intake/output data recorded.  PHYSICAL EXAM:  Physical Exam Vitals and nursing note reviewed. Exam conducted with a chaperone present.  Constitutional:      General: She is not in acute distress.    Appearance: Normal appearance. She is normal weight. She is not ill-appearing.  HENT:     Head: Normocephalic and atraumatic.     Mouth/Throat:     Mouth: Mucous membranes are moist.     Pharynx: Oropharynx is clear.  Eyes:     General: No scleral icterus.    Conjunctiva/sclera: Conjunctivae normal.  Cardiovascular:     Rate and Rhythm: Normal rate and regular rhythm.     Pulses: Normal pulses.  Pulmonary:     Effort: Pulmonary effort is normal. No respiratory distress.     Breath sounds: Normal breath sounds.  Abdominal:     General: A surgical scar is present. There is no distension.     Palpations: Abdomen is soft.     Tenderness: There is abdominal tenderness (Mild) in the epigastric area. There is no guarding or rebound.     Comments: Abdomen is soft, she has mild epigastric tenderness, non-distended, no rebound/guarding. Previous surgical scars seen.    Genitourinary:    Comments: Deferred Musculoskeletal:     Right lower leg: No edema.     Left lower leg: No edema.  Skin:    General: Skin is warm and dry.     Coloration: Skin is not pale.     Findings: No erythema.  Neurological:     General: No focal deficit present.     Mental Status: She is alert and oriented to person, place, and time.  Psychiatric:        Mood and Affect: Mood normal.        Behavior: Behavior normal.     Labs:  CBC Latest Ref Rng & Units 01/26/2021 01/25/2021 12/08/2020  WBC 4.0 - 10.5 K/uL 12.2(H) 13.6(H) 4.8  Hemoglobin 12.0 - 15.0 g/dL 13.4 15.2(H) 13.7  Hematocrit 36.0 - 46.0 % 39.3 42.7 40.0  Platelets 150 - 400 K/uL 189 236 237.0   CMP Latest Ref Rng & Units 01/26/2021 01/25/2021 12/08/2020  Glucose 70 - 99 mg/dL 121(H) 185(H) 109(H)  BUN 8 - 23 mg/dL '22 21 16  '$ Creatinine 0.44 - 1.00 mg/dL 0.75 1.04(H) 0.93  Sodium 135 - 145 mmol/L 138 137 139  Potassium 3.5 - 5.1 mmol/L 3.9 3.5 4.1  Chloride 98 - 111 mmol/L 102 101 104  CO2 22 - 32 mmol/L '28 23 27  '$ Calcium 8.9 - 10.3 mg/dL 8.8(L) 9.5 9.6  Total Protein 6.5 - 8.1 g/dL - 7.9 -  Total Bilirubin 0.3 - 1.2 mg/dL - 1.8(H) -  Alkaline Phos 38 - 126 U/L - 62 -  AST 15 - 41 U/L - 33 -  ALT 0 - 44 U/L - 20 -     Imaging studies:   CT Abdomen/Pelvis (01/25/2021) personally reviewed which shows few loops of dilated small bowel concerning for possible developing vs partial SBO, and radiologist report reviewed below:  IMPRESSION: Dilated loops of small bowel in the left mid abdomen, with transition point in the right mid abdomen, suggesting at least partial small bowel obstruction.   Small volume pelvic ascites.  No free air.  Assessment/Plan: (ICD-10's: K88.609) 70 y.o. female with abdominal pain, nausea, and emesis found to have likely pSBO secondary to post-surgical adhesive disease.   - Appreciate medicine admission - I think we can hold off on  NGT for now as her symptoms seem improved this  morning. She understands that should she develop nausea, emesis, distension we would proceed with placement.  - Recommend continued NPO - Continue IVF Resuscitation   - Monitor abdominal examination; on-going bowel function - Serial KUBs   - No emergent surgical intervention - Encouraged mobilization as tolerated   - Further management per primary service; we will follow   All of the above findings and recommendations were discussed with the patient, and all of patient's questions were answered to her expressed satisfaction.  Thank you for the opportunity to participate in this patient's care.   -- Edison Simon, PA-C [facility] 01/26/2021, 8:14 AM [phone] M-F: 7am - 4pm

## 2021-01-26 NOTE — ED Notes (Signed)
Pt provided with diet soda per request.

## 2021-01-26 NOTE — ED Notes (Signed)
[facility] RN aware of assigned bed

## 2021-01-26 NOTE — ED Provider Notes (Signed)
Emergency department handoff note  Care of this patient was signed out to me at the end of the previous provider shift.  All pertinent patient information was conveyed and all questions were answered.  Patient pending CT of the abdomen and pelvis which shows multiple dilated loops of small bowel without a transition point likely representing a small bowel obstruction.  I spoke to Dr. Valetta Close and surgery who agreed to see patient in the morning.  I spoke to Dr. Normand Sloop in internal medicine agrees to accept this patient onto his service for further evaluation and management.  Discussed the plan with patient who expresses understanding and all questions were answered.  Dispo: Admit to medicine  CRITICAL CARE Performed by: Naaman Plummer   Total critical care time: 13 minutes  Critical care time was exclusive of separately billable procedures and treating other patients.  Critical care was necessary to treat or prevent imminent or life-threatening deterioration.  Critical care was time spent personally by me on the following activities: development of treatment plan with patient and/or surrogate as well as nursing, discussions with consultants, evaluation of patient's response to treatment, examination of patient, obtaining history from patient or surrogate, ordering and performing treatments and interventions, ordering and review of laboratory studies, ordering and review of radiographic studies, pulse oximetry and re-evaluation of patient's condition.    Naaman Plummer, MD 01/26/21 [phone]

## 2021-01-27 ENCOUNTER — Inpatient Hospital Stay: Payer: Medicare HMO

## 2021-01-27 DIAGNOSIS — E872 Acidosis, unspecified: Secondary | ICD-10-CM

## 2021-01-27 DIAGNOSIS — E860 Dehydration: Secondary | ICD-10-CM

## 2021-01-27 DIAGNOSIS — K56600 Partial intestinal obstruction, unspecified as to cause: Secondary | ICD-10-CM

## 2021-01-27 LAB — URINE CULTURE

## 2021-01-27 LAB — CBC
HCT: 36.9 % (ref 36.0–46.0)
Hemoglobin: 12.7 g/dL (ref 12.0–15.0)
MCH: 33.7 pg (ref 26.0–34.0)
MCHC: 34.4 g/dL (ref 30.0–36.0)
MCV: 97.9 fL (ref 80.0–100.0)
Platelets: 193 10*3/uL (ref 150–400)
RBC: 3.77 MIL/uL — ABNORMAL LOW (ref 3.87–5.11)
RDW: 13.9 % (ref 11.5–15.5)
WBC: 10.3 10*3/uL (ref 4.0–10.5)
nRBC: 0 % (ref 0.0–0.2)

## 2021-01-27 LAB — COMPREHENSIVE METABOLIC PANEL
ALT: 16 U/L (ref 0–44)
AST: 29 U/L (ref 15–41)
Albumin: 3.3 g/dL — ABNORMAL LOW (ref 3.5–5.0)
Alkaline Phosphatase: 40 U/L (ref 38–126)
Anion gap: 8 (ref 5–15)
BUN: 28 mg/dL — ABNORMAL HIGH (ref 8–23)
CO2: 24 mmol/L (ref 22–32)
Calcium: 8.4 mg/dL — ABNORMAL LOW (ref 8.9–10.3)
Chloride: 105 mmol/L (ref 98–111)
Creatinine, Ser: 0.82 mg/dL (ref 0.44–1.00)
GFR, Estimated: >60 mL/min (ref >60)
Glucose, Bld: 127 mg/dL — ABNORMAL HIGH (ref 70–99)
Potassium: 4.3 mmol/L (ref 3.5–5.1)
Sodium: 137 mmol/L (ref 135–145)
Total Bilirubin: 2.1 mg/dL — ABNORMAL HIGH (ref 0.3–1.2)
Total Protein: 6.3 g/dL — ABNORMAL LOW (ref 6.5–8.1)

## 2021-01-27 MED ORDER — OXYCODONE HCL 5 MG PO TABS
5.0000 mg | ORAL_TABLET | Freq: Four times a day (QID) | ORAL | Status: DC | PRN
Start: 2021-01-27 — End: 2021-01-28
  Administered 2021-01-28: 5 mg via ORAL
  Filled 2021-01-27: qty 1

## 2021-01-27 MED ORDER — HYDROMORPHONE HCL 1 MG/ML IJ SOLN: 0.5000 mg | INTRAMUSCULAR | Status: DC | PRN

## 2021-01-27 MED ORDER — SODIUM CHLORIDE 0.9 % IV SOLN
INTRAVENOUS | Status: DC
  Administered 2021-01-27: 50 mL via INTRAVENOUS

## 2021-01-27 NOTE — Plan of Care (Signed)

## 2021-01-27 NOTE — Progress Notes (Signed)
Patient ID: Valerie [name], female   DOB: [birth year], 70 y.o.   MRN: [id number] [facility] PROGRESS NOTE  Valerie [name] [id number] DOB: [birth year] DOA: 01/25/2021 PCP: Pleas Koch, NP  HPI/Subjective: Patient feeling better.  No further vomiting since last night.  NG tube came out and has remained out.  Patient also had bowel movements.  No abdominal pain.  Admitted with partial small bowel obstruction.  Objective: Vitals:   01/27/21 0445 01/27/21 0823  BP: 115/64 129/68  Pulse: 76 74  Resp: 18   Temp: 97.9 F (36.6 C) 98.2 F (36.8 C)  SpO2: 96% 95%    Intake/Output Summary (Last 24 hours) at 01/27/2021 1313 Last data filed at 01/27/2021 [id number] Gross per 24 hour  Intake 2509.62 ml  Output 300 ml  Net 2209.62 ml   Filed Weights   01/25/21 1941  Weight: 70.3 kg    ROS: Review of Systems  Respiratory:  Negative for shortness of breath.   Cardiovascular:  Negative for chest pain.  Gastrointestinal:  Negative for abdominal pain, nausea and vomiting.  Exam: Physical Exam HENT:     Head: Normocephalic.     Mouth/Throat:     Pharynx: No oropharyngeal exudate.  Eyes:     General: Lids are normal.     Conjunctiva/sclera: Conjunctivae normal.     Pupils: Pupils are equal, round, and reactive to light.  Cardiovascular:     Rate and Rhythm: Normal rate and regular rhythm.     Heart sounds: Normal heart sounds, S1 normal and S2 normal.  Pulmonary:     Breath sounds: Normal breath sounds. No decreased breath sounds, wheezing, rhonchi or rales.  Abdominal:     Palpations: Abdomen is soft.     Tenderness: There is no abdominal tenderness.  Musculoskeletal:     Right lower leg: No swelling.     Left lower leg: No swelling.  Skin:    General: Skin is warm.     Findings: No rash.  Neurological:     Mental Status: She is alert and oriented to person, place, and time.      Scheduled Meds:  atorvastatin  20 mg Oral Daily   DULoxetine  20 mg Oral Daily   enoxaparin  (LOVENOX) injection  40 mg Subcutaneous Q24H   losartan  100 mg Oral Daily   metoprolol succinate  50 mg Oral Daily   pantoprazole  40 mg Oral Daily   Continuous Infusions:  sodium chloride 50 mL (01/27/21 1001)    Assessment/Plan:  Partial small bowel obstruction.  Patient had NG tube placed last night secondary to vomiting.  NG tube came out and no further vomiting.  General surgery wants to watch without the NG tube.  Continue IV fluids.  Clear liquid diet started.  Empirically on Protonix.  Advised to ambulate. Lactic acidosis on presentation improved with fluids Dehydration with vomiting.  Improved with fluids Essential hypertension.  Holding Norvasc.  Continue Cozaar and metoprolol Hyperlipidemia unspecified on Lipitor Depression on Cymbalta     Code Status:     Code Status Orders  (From admission, onward)           Start     Ordered   01/26/21 0218  Full code  Continuous        01/26/21 0223           Code Status History     This patient has a current code status but no historical code status.  Family Communication: Husband at the bedside Disposition Plan: Status is: Inpatient  Dispo: The patient is from: Home              Anticipated d/c is to: Home              Patient currently being treated for small bowel obstruction.  Started on liquid diet today.  We will have to tolerate solid food prior to disposition.   Difficult to place patient.  No.  Consultants: General surgery  Time spent: 27 minutes  [place]

## 2021-01-27 NOTE — Progress Notes (Signed)
Transported to Radiology via W/C. Lancelot Alyea K, RN9/05/2020; 7:54 AM

## 2021-01-27 NOTE — Progress Notes (Signed)
CC: SBO  Subjective: Canister out of the NG tube is around 550 cc.  However nursing notes document 300.  It was all bilious.  NG tube came out.  Patient reports last having flatus and having a bowel movement this morning. She feels much better KUB personally reviewed there is improvement no free air or pneumatosis Creat and wbc nml  Objective: Vital signs  Temp:  [97.9 F (36.6 C)-98.9 F (37.2 C)] 98.2 F (36.8 C) (09/01 0823) Pulse Rate:  [67-76] 74 (09/01 0823) Resp:  [16-18] 18 (09/01 0445) BP: (115-139)/(64-80) 129/68 (09/01 0823) SpO2:  [92 %-97 %] 95 % (09/01 0823) Last BM Date: 01/24/21  Intake/Output from previous day: 08/31 0701 - 09/01 0700 In: 2509.6 [I.V.:2449.6; NG/GT:60] Out: 300 [Emesis/NG output:300] Intake/Output this shift: No intake/output data recorded.  Physical exam:  NAD alert Abd: decrease bs, soft, nt no peritonitis Ext: no edema, warm  and well perfused Neuro: Alert, GCS 15, no motor or sens deficits   Lab Results: CBC  Recent Labs    01/26/21 0522 01/27/21 0559  WBC 12.2* 10.3  HGB 13.4 12.7  HCT 39.3 36.9  PLT 189 193   BMET Recent Labs    01/26/21 0522 01/27/21 0559  NA 138 137  K 3.9 4.3  CL 102 105  CO2 28 24  GLUCOSE 121* 127*  BUN 22 28*  CREATININE 0.75 0.82  CALCIUM 8.8* 8.4*   PT/INR Recent Labs    01/25/21 2254  LABPROT 13.2  INR 1.0   ABG No results for input(s): PHART, HCO3 in the last 72 hours.  Invalid input(s): PCO2, PO2  Studies/Results: DG Abd 1 View  Result Date: 01/26/2021 CLINICAL DATA:  NG placement EXAM: ABDOMEN - 1 VIEW COMPARISON:  08/13/2020 FINDINGS: NG tube in the stomach with the tip in the fundus. Multiple dilated small bowel loops suggesting small bowel obstruction. Colon decompressed. IMPRESSION: NG tube in the stomach with the tip in the fundus Dilated small bowel loops compatible with small bowel obstruction. Electronically Signed   By: Franchot Gallo M.D.   On: 01/26/2021 19:31    CT ABDOMEN PELVIS W CONTRAST  Result Date: 01/26/2021 CLINICAL DATA:  Abdominal pain, nausea/vomiting EXAM: CT ABDOMEN AND PELVIS WITH CONTRAST TECHNIQUE: Multidetector CT imaging of the abdomen and pelvis was performed using the standard protocol following bolus administration of intravenous contrast. CONTRAST:  15m OMNIPAQUE IOHEXOL 350 MG/ML SOLN COMPARISON:  11/30/2020 FINDINGS: Lower chest: Lung bases are clear. Hepatobiliary: Liver is within normal limits. Status post cholecystectomy. No intrahepatic ductal dilatation. Dilated common duct, measuring 10 mm. Pancreas: Within normal limits. Spleen: Within normal limits. Adrenals/Urinary Tract: Adrenal glands are within normal limits. Kidneys are within normal limits.  No hydronephrosis. Bladder is within normal limits. Stomach/Bowel: Stomach is within normal limits. Multiple dilated loops of small bowel in the left mid abdomen, suggesting small bowel obstruction, with transition point in the right mid abdomen (coronal image 23). Normal appendix (series 2/image 67). Right colon is within normal limits.  Left colon is decompressed. Vascular/Lymphatic: No evidence of abdominal aortic aneurysm. Atherosclerotic calcifications of the abdominal aorta and branch vessels. No suspicious abdominopelvic lymphadenopathy. Reproductive: Status post hysterectomy. No adnexal masses. Other: Small volume pelvic ascites. No free air. Musculoskeletal: Degenerative changes of the visualized thoracolumbar spine. IMPRESSION: Dilated loops of small bowel in the left mid abdomen, with transition point in the right mid abdomen, suggesting at least partial small bowel obstruction. Small volume pelvic ascites.  No free air. Electronically Signed  By: Julian Hy M.D.   On: 01/26/2021 00:03   DG Abd 2 Views  Result Date: 01/27/2021 CLINICAL DATA:  Small bowel obstruction. EXAM: ABDOMEN - 2 VIEW COMPARISON:  January 26, 2021. FINDINGS: Nasogastric tube tip is seen in proximal  stomach. Slightly decreased small bowel dilatation is noted suggesting improving ileus or distal small bowel obstruction. There is no evidence of free air. No radio-opaque calculi or other significant radiographic abnormality is seen. IMPRESSION: Probable slightly improved small-bowel ileus or obstruction. Electronically Signed   By: Marijo Conception M.D.   On: 01/27/2021 08:35    Anti-infectives: Anti-infectives (From admission, onward)    None       Assessment/Plan: Partial SBO clinically improving NGT came out this am and we will leave it out for now, may do only CLD later this PM NO surgical intervention required Mobilized We will continue to follow  I spent 35 minutes in this encounter.  50% spent in coordination and counseling  Caroleen Hamman, MD, [facility]  01/27/2021

## 2021-01-28 ENCOUNTER — Inpatient Hospital Stay: Payer: Medicare HMO

## 2021-01-28 ENCOUNTER — Encounter: Payer: Self-pay | Admitting: Urology

## 2021-01-28 LAB — BASIC METABOLIC PANEL
Anion gap: 2 — ABNORMAL LOW (ref 5–15)
BUN: 19 mg/dL (ref 8–23)
CO2: 24 mmol/L (ref 22–32)
Calcium: 7.9 mg/dL — ABNORMAL LOW (ref 8.9–10.3)
Chloride: 108 mmol/L (ref 98–111)
Creatinine, Ser: 0.61 mg/dL (ref 0.44–1.00)
GFR, Estimated: >60 mL/min (ref >60)
Glucose, Bld: 115 mg/dL — ABNORMAL HIGH (ref 70–99)
Potassium: 3.5 mmol/L (ref 3.5–5.1)
Sodium: 134 mmol/L — ABNORMAL LOW (ref 135–145)

## 2021-01-28 MED ORDER — ONDANSETRON HCL 4 MG PO TABS: 4.0000 mg | ORAL_TABLET | Freq: Four times a day (QID) | ORAL | 0 refills | Status: DC | PRN

## 2021-01-28 NOTE — Plan of Care (Signed)
Continuing with plan of care. 

## 2021-01-28 NOTE — Plan of Care (Signed)
Discharge teaching completed with patient who is in stable condition. 

## 2021-01-28 NOTE — Discharge Summary (Signed)
[place] [facility] [place] NAME: Valerie [name]    MR#:  [id number]  DATE OF BIRTH:  1950-06-06  DATE OF ADMISSION:  01/25/2021 ADMITTING PHYSICIAN: Christel Mormon, MD  DATE OF DISCHARGE: 01/28/2021  PRIMARY CARE PHYSICIAN: Pleas Koch, NP    ADMISSION DIAGNOSIS:  SBO (small bowel obstruction) (HCC) [K56.609] Flank pain [R10.9] Partial small bowel obstruction (HCC) [K56.600] Abdominal pain, unspecified abdominal location [R10.9] Nausea and vomiting, intractability of vomiting not specified, unspecified vomiting type [R11.2]  DISCHARGE DIAGNOSIS:  Active Problems:   Partial small bowel obstruction (HCC)   Lactic acidosis   Dehydration   SECONDARY DIAGNOSIS:   Past Medical History:  Diagnosis Date  . Acid reflux   . Adenocarcinoma of the endometrium/uterus ([place]) 12/04/2000  . Anal fissure   . Atrophic vaginitis   . Difficult intubation    limited neck mobility  . Esophagitis   . Hernia    Present to proximal to umbillical   . History of hiatal hernia   . Hyperlipidemia   . Hypertension   . Internal hemorrhoids   . Menorrhagia   . Osteopenia   . PONV (postoperative nausea and vomiting)   . Vertigo   . Vitamin D deficiency     HOSPITAL COURSE:   1.  Partial small bowel obstruction.  The patient had an NG tube during the hospital course.  Seen by general surgery.  The patient was having bowel movements and tolerated solid food.  The patient wanted to go home.  Her x-ray did still show some air-fluid levels.  Patient stated that she felt okay and okay and wanted to go home.  Can follow-up with general surgery as outpatient. 2.  Lactic acidosis on presentation improved with IV fluids 3.  Dehydration with vomiting.  Improved with fluids. 4.  Essential hypertension.  Hold Norvasc.  Continue Cozaar metoprolol 5.  Hyperlipidemia unspecified on Lipitor 6.  Depression on Cymbalta  DISCHARGE CONDITIONS:   Fair  CONSULTS OBTAINED:   Treatment Team:  Jules Husbands, MD  DRUG ALLERGIES:  No Known Allergies  DISCHARGE MEDICATIONS:   Allergies as of 01/28/2021   No Known Allergies      Medication List     STOP taking these medications    amLODipine 2.5 MG tablet Commonly known as: NORVASC       TAKE these medications    acetaminophen 500 MG tablet Commonly known as: TYLENOL Take 500-1,000 mg by mouth every 6 (six) hours as needed (for pain).   atorvastatin 20 MG tablet Commonly known as: LIPITOR Take 20 mg by mouth daily.   DULoxetine 20 MG capsule Commonly known as: Cymbalta Take 1 capsule (20 mg total) by mouth daily. For anxiety/depression/pain   estradiol 0.1 MG/GM vaginal cream Commonly known as: ESTRACE Estrogen Cream Instruction Discard applicator Apply pea sized amount to tip of finger to urethra before bed. Wash hands well after application. Use Monday, Wednesday and Friday   losartan 100 MG tablet Commonly known as: COZAAR Take 1 tablet once daily for blood pressure.   metoprolol succinate 50 MG 24 hr tablet Commonly known as: TOPROL-XL Take 1 tablet (50 mg total) by mouth daily. For heart rate/palpitations.   omeprazole 40 MG capsule Commonly known as: PRILOSEC TAKE 1 CAPSULE (40 MG TOTAL) BY MOUTH DAILY. FOR HEARTBURN.   ondansetron 4 MG tablet Commonly known as: ZOFRAN Take 1 tablet (4 mg total) by mouth every 6 (six) hours as needed for nausea.  DISCHARGE INSTRUCTIONS:  Follow-up PMD 5 days Follow-up general surgery  If you experience worsening of your admission symptoms, develop shortness of breath, life threatening emergency, suicidal or homicidal thoughts you must seek medical attention immediately by calling 911 or calling your MD immediately  if symptoms less severe.  You Must read complete instructions/literature along with all the possible adverse reactions/side effects for all the Medicines you take and that have been prescribed to you. Take any new  Medicines after you have completely understood and accept all the possible adverse reactions/side effects.   Please note  You were cared for by a hospitalist during your hospital stay. If you have any questions about your discharge medications or the care you received while you were in the hospital after you are discharged, you can call the unit and asked to speak with the hospitalist on call if the hospitalist that took care of you is not available. Once you are discharged, your primary care physician will handle any further medical issues. Please note that NO REFILLS for any discharge medications will be authorized once you are discharged, as it is imperative that you return to your primary care physician (or establish a relationship with a primary care physician if you do not have one) for your aftercare needs so that they can reassess your need for medications and monitor your lab values.    Today   CHIEF COMPLAINT:   Chief Complaint  Patient presents with  . Nausea    HISTORY OF PRESENT ILLNESS:  Valerie [name]  is a 70 y.o. female came in with nausea vomiting and found to have partial small bowel obstruction   VITAL SIGNS:  Blood pressure 137/83, pulse 69, temperature 98.2 F (36.8 C), temperature source Oral, resp. rate 18, height '5\' 5"'$  (1.651 m), weight 70.3 kg, SpO2 93 %.  I/O:   Intake/Output Summary (Last 24 hours) at 01/28/2021 1845 Last data filed at 01/28/2021 1410 Gross per 24 hour  Intake 1799.09 ml  Output --  Net 1799.09 ml    PHYSICAL EXAMINATION:  GENERAL:  70 y.o.-year-old patient lying in the bed with no acute distress.  EYES: Pupils equal, round, reactive to light and accommodation. No scleral icterus.  HEENT: Head atraumatic, normocephalic. Oropharynx and nasopharynx clear.   LUNGS: Normal breath sounds bilaterally, no wheezing, rales,rhonchi or crepitation. No use of accessory muscles of respiration.  CARDIOVASCULAR: S1, S2 normal. No murmurs, rubs, or  gallops.  ABDOMEN: Soft, non-tender, non-distended. Bowel sounds present. EXTREMITIES: No pedal edema, cyanosis, or clubbing.  NEUROLOGIC: Cranial nerves II through XII are intact. Muscle strength 5/5 in all extremities. Sensation intact. Gait not checked.  PSYCHIATRIC: The patient is alert and oriented x 3.  SKIN: No obvious rash, lesion, or ulcer.   DATA REVIEW:   CBC Recent Labs  Lab 01/27/21 0559  WBC 10.3  HGB 12.7  HCT 36.9  PLT 193    Chemistries  Recent Labs  Lab 01/27/21 0559 01/28/21 0533  NA 137 134*  K 4.3 3.5  CL 105 108  CO2 24 24  GLUCOSE 127* 115*  BUN 28* 19  CREATININE 0.82 0.61  CALCIUM 8.4* 7.9*  AST 29  --   ALT 16  --   ALKPHOS 40  --   BILITOT 2.1*  --      Microbiology Results  Results for orders placed or performed during the hospital encounter of 01/25/21  Urine Culture     Status: Abnormal   Collection Time: 01/25/21  7:46  PM   Specimen: Urine, Random  Result Value Ref Range Status   Specimen Description   Final    URINE, RANDOM Performed at [facility], [address] [place], [place] [address]    Special Requests   Final    NONE Performed at [facility], [address] [place], [place] [address]    Culture MULTIPLE SPECIES PRESENT, SUGGEST RECOLLECTION (A)  Final   Report Status 01/27/2021 FINAL  Final  Blood culture (routine single)     Status: None (Preliminary result)   Collection Time: 01/25/21 10:54 PM   Specimen: BLOOD  Result Value Ref Range Status   Specimen Description BLOOD RIGHT ASSIST CONTROL  Final   Special Requests   Final    BOTTLES DRAWN AEROBIC AND ANAEROBIC Blood Culture results may not be optimal due to an excessive volume of blood received in culture bottles   Culture   Final    NO GROWTH 3 DAYS Performed at [facility], [address], [place], [place] [address]    Report Status PENDING  Incomplete  Resp Panel by RT-PCR (Flu A&B, Covid) Nasopharyngeal Swab      Status: None   Collection Time: 01/26/21  1:12 AM   Specimen: Nasopharyngeal Swab; Nasopharyngeal(NP) swabs in vial transport medium  Result Value Ref Range Status   SARS Coronavirus 2 by RT PCR NEGATIVE NEGATIVE Final    Comment: (NOTE) SARS-CoV-2 target nucleic acids are NOT DETECTED.  The SARS-CoV-2 RNA is generally detectable in upper respiratory specimens during the acute phase of infection. The lowest concentration of SARS-CoV-2 viral copies this assay can detect is 138 copies/mL. A negative result does not preclude SARS-Cov-2 infection and should not be used as the sole basis for treatment or other patient management decisions. A negative result may occur with  improper specimen collection/handling, submission of specimen other than nasopharyngeal swab, presence of viral mutation(s) within the areas targeted by this assay, and inadequate number of viral copies(<138 copies/mL). A negative result must be combined with clinical observations, patient history, and epidemiological information. The expected result is Negative.  Fact Sheet for Patients:  EntrepreneurPulse.com.au  Fact Sheet for Healthcare Providers:  IncredibleEmployment.be  This test is no t yet approved or cleared by the [place] FDA and  has been authorized for detection and/or diagnosis of SARS-CoV-2 by FDA under an Emergency Use Authorization (EUA). This EUA will remain  in effect (meaning this test can be used) for the duration of the COVID-19 declaration under Section 564(b)(1) of the Act, 21 U.S.C.section 360bbb-3(b)(1), unless the authorization is terminated  or revoked sooner.       Influenza A by PCR NEGATIVE NEGATIVE Final   Influenza B by PCR NEGATIVE NEGATIVE Final    Comment: (NOTE) The Xpert Xpress SARS-CoV-2/FLU/RSV plus assay is intended as an aid in the diagnosis of influenza from Nasopharyngeal swab specimens and should not be used as a sole basis  for treatment. Nasal washings and aspirates are unacceptable for Xpert Xpress SARS-CoV-2/FLU/RSV testing.  Fact Sheet for Patients: EntrepreneurPulse.com.au  Fact Sheet for Healthcare Providers: IncredibleEmployment.be  This test is not yet approved or cleared by the [place] FDA and has been authorized for detection and/or diagnosis of SARS-CoV-2 by FDA under an Emergency Use Authorization (EUA). This EUA will remain in effect (meaning this test can be used) for the duration of the COVID-19 declaration under Section 564(b)(1) of the Act, 21 U.S.C. section 360bbb-3(b)(1), unless the authorization is terminated or revoked.  Performed  at [facility], [address], [place],  [address]     RADIOLOGY:  Bea Graff 2 Views  Result Date: 01/27/2021 CLINICAL DATA:  Small bowel obstruction. EXAM: ABDOMEN - 2 VIEW COMPARISON:  January 26, 2021. FINDINGS: Nasogastric tube tip is seen in proximal stomach. Slightly decreased small bowel dilatation is noted suggesting improving ileus or distal small bowel obstruction. There is no evidence of free air. No radio-opaque calculi or other significant radiographic abnormality is seen. IMPRESSION: Probable slightly improved small-bowel ileus or obstruction. Electronically Signed   By: Marijo Conception M.D.   On: 01/27/2021 08:35   DG ABD ACUTE 2+V W 1V CHEST  Result Date: 01/28/2021 CLINICAL DATA:  Small bowel obstruction. EXAM: DG ABDOMEN ACUTE WITH 1 VIEW CHEST COMPARISON:  CT 01/25/2021 and abdominal series 01/27/2021 FINDINGS: Low lung volumes. Few densities at the lung bases. Evidence for neurostimulator and surgical plate in the lower cervical spine. The nasogastric tube has been removed. Dilated loops of small bowel with air-fluid levels in the mid abdomen. Negative for free air. There is evidence for colonic gas. Surgical clips in the right upper abdomen. Rightward curvature in the upper lumbar  spine. No significant gas in the rectum. IMPRESSION: 1. Persistent dilated loops of small bowel with air-fluid levels in the mid abdomen. Findings could be related to partial obstruction or ileus. Minimal change from the previous examination. 2. Low lung volumes with bibasilar densities. Basilar chest densities are suggestive for atelectasis and/or small effusions. Electronically Signed   By: Markus Daft M.D.   On: 01/28/2021 09:58      Management plans discussed with the patient, family and they are in agreement.  CODE STATUS:     Code Status Orders  (From admission, onward)           Start     Ordered   01/26/21 0218  Full code  Continuous        01/26/21 0223           Code Status History     This patient has a current code status but no historical code status.       TOTAL TIME TAKING CARE OF THIS PATIENT: 32 minutes.    Loletha Grayer M.D on 01/28/2021 at 6:45 PM   [facility]  CC: Primary care physician; Pleas Koch, NP

## 2021-01-28 NOTE — Progress Notes (Signed)
CC: SBO  Subjective: Patient reports last having flatus and she feels much better.  Only has pudding with breakfast this morning, has not touched lunch tray yet a full liquids. She feels she does not have much of an appetite as of yet, but denies nausea.  Objective: Vital signs  Temp:  [98 F (36.7 C)-98.6 F (37 C)] 98 F (36.7 C) (09/02 0816) Pulse Rate:  [66-82] 66 (09/02 0816) Resp:  [18-20] 18 (09/02 0816) BP: (122-148)/(78-84) 148/83 (09/02 0816) SpO2:  [93 %-98 %] 96 % (09/02 0816) Last BM Date: 01/27/21  Intake/Output from previous day: 09/01 0701 - 09/02 0700 In: 664.7 [P.O.:120; I.V.:544.7] Out: -  Intake/Output this shift: Total I/O In: 774.4 [P.O.:480; I.V.:294.4] Out: -   Physical exam:  NAD alert Abd: soft, nontender, nondistended Ext: no edema, warm  and well perfused Neuro: Alert, GCS 15, no motor or sens deficits   Lab Results: CBC  Recent Labs    01/26/21 0522 01/27/21 0559  WBC 12.2* 10.3  HGB 13.4 12.7  HCT 39.3 36.9  PLT 189 193    BMET Recent Labs    01/27/21 0559 01/28/21 0533  NA 137 134*  K 4.3 3.5  CL 105 108  CO2 24 24  GLUCOSE 127* 115*  BUN 28* 19  CREATININE 0.82 0.61  CALCIUM 8.4* 7.9*    PT/INR Recent Labs    01/25/21 2254  LABPROT 13.2  INR 1.0    ABG No results for input(s): PHART, HCO3 in the last 72 hours.  Invalid input(s): PCO2, PO2  Studies/Results: DG Abd 1 View  Result Date: 01/26/2021 CLINICAL DATA:  NG placement EXAM: ABDOMEN - 1 VIEW COMPARISON:  08/13/2020 FINDINGS: NG tube in the stomach with the tip in the fundus. Multiple dilated small bowel loops suggesting small bowel obstruction. Colon decompressed. IMPRESSION: NG tube in the stomach with the tip in the fundus Dilated small bowel loops compatible with small bowel obstruction. Electronically Signed   By: Franchot Gallo M.D.   On: 01/26/2021 19:31   DG Abd 2 Views  Result Date: 01/27/2021 CLINICAL DATA:  Small bowel obstruction. EXAM:  ABDOMEN - 2 VIEW COMPARISON:  January 26, 2021. FINDINGS: Nasogastric tube tip is seen in proximal stomach. Slightly decreased small bowel dilatation is noted suggesting improving ileus or distal small bowel obstruction. There is no evidence of free air. No radio-opaque calculi or other significant radiographic abnormality is seen. IMPRESSION: Probable slightly improved small-bowel ileus or obstruction. Electronically Signed   By: Marijo Conception M.D.   On: 01/27/2021 08:35   DG ABD ACUTE 2+V W 1V CHEST  Result Date: 01/28/2021 CLINICAL DATA:  Small bowel obstruction. EXAM: DG ABDOMEN ACUTE WITH 1 VIEW CHEST COMPARISON:  CT 01/25/2021 and abdominal series 01/27/2021 FINDINGS: Low lung volumes. Few densities at the lung bases. Evidence for neurostimulator and surgical plate in the lower cervical spine. The nasogastric tube has been removed. Dilated loops of small bowel with air-fluid levels in the mid abdomen. Negative for free air. There is evidence for colonic gas. Surgical clips in the right upper abdomen. Rightward curvature in the upper lumbar spine. No significant gas in the rectum. IMPRESSION: 1. Persistent dilated loops of small bowel with air-fluid levels in the mid abdomen. Findings could be related to partial obstruction or ileus. Minimal change from the previous examination. 2. Low lung volumes with bibasilar densities. Basilar chest densities are suggestive for atelectasis and/or small effusions. Electronically Signed   By: Scherrie Gerlach.D.  On: 01/28/2021 09:58    Anti-infectives: Anti-infectives (From admission, onward)    None       Assessment/Plan: Partial SBO clinically improving, imaging less so. On full liquid diet, minimal appetite to confirm. Flatus consistent with resolving partial SBO. NO surgical intervention required/planned Mobilized She wishes to go home, and feels she will likely improve her diet with foods she appreciates.   I anticipate continued recovery at home.    I spent 35 minutes in this encounter.  50% spent in coordination and counseling  Ronny Bacon, MD, [facility]  01/28/2021

## 2021-01-28 NOTE — Care Management Important Message (Signed)
Important Message  Patient Details  Name: Valerie [name] MRN: [id number] Date of Birth: Sep 18, 1950   Medicare Important Message Given:  Yes     Dannette Barbara 01/28/2021, 2:56 PM

## 2021-02-01 ENCOUNTER — Ambulatory Visit: Payer: Medicare HMO | Admitting: Gastroenterology

## 2021-02-01 LAB — CULTURE, BLOOD (SINGLE): Culture: NO GROWTH

## 2021-02-02 ENCOUNTER — Other Ambulatory Visit: Payer: Medicare HMO

## 2021-02-02 ENCOUNTER — Other Ambulatory Visit: Payer: Self-pay

## 2021-02-02 DIAGNOSIS — R3129 Other microscopic hematuria: Secondary | ICD-10-CM | POA: Diagnosis not present

## 2021-02-03 ENCOUNTER — Telehealth: Payer: Self-pay

## 2021-02-03 LAB — URINALYSIS, COMPLETE
Bilirubin, UA: NEGATIVE
Glucose, UA: NEGATIVE
Ketones, UA: NEGATIVE
Nitrite, UA: NEGATIVE
Protein,UA: NEGATIVE
Specific Gravity, UA: 1.015 (ref 1.005–1.030)
Urobilinogen, Ur: 1 mg/dL (ref 0.2–1.0)
pH, UA: 7 (ref 5.0–7.5)

## 2021-02-03 LAB — MICROSCOPIC EXAMINATION: RBC, Urine: >30 /hpf — AB (ref 0–2)

## 2021-02-03 NOTE — Telephone Encounter (Signed)
Called pt informed her of the information below. Pt gave verbal understanding. She states that she will call her daughter and call us back to schedule cysto so that she can be with her.

## 2021-02-03 NOTE — Telephone Encounter (Signed)
-----   Message from Billey Co, MD sent at 02/03/2021  9:45 AM EDT ----- Persistent microscopic blood in urine, would recommend cystoscopy to rule out any bladder abnormality. Has already had CT X2 this summer with no urologic abnomalities  Nickolas Madrid, MD 02/03/2021

## 2021-02-08 ENCOUNTER — Other Ambulatory Visit: Payer: Self-pay

## 2021-02-08 ENCOUNTER — Ambulatory Visit: Payer: Medicare HMO | Admitting: Urology

## 2021-02-08 ENCOUNTER — Other Ambulatory Visit: Payer: Self-pay | Admitting: *Deleted

## 2021-02-08 ENCOUNTER — Encounter: Payer: Self-pay | Admitting: Urology

## 2021-02-08 VITALS — BP 160/76 | HR 76 | Ht 65.0 in | Wt 146.8 lb

## 2021-02-08 DIAGNOSIS — N390 Urinary tract infection, site not specified: Secondary | ICD-10-CM

## 2021-02-08 DIAGNOSIS — R3129 Other microscopic hematuria: Secondary | ICD-10-CM

## 2021-02-08 MED ORDER — LIDOCAINE HCL URETHRAL/MUCOSAL 2 % EX GEL
1.0000 "application " | Freq: Once | CUTANEOUS | Status: AC
  Administered 2021-02-08: 1 via URETHRAL

## 2021-02-08 NOTE — Progress Notes (Signed)
Cystoscopy Procedure Note:  Indication: Microscopic hematuria, possible gross hematuria  After informed consent and discussion of the procedure and its risks, Valerie [name] was positioned and prepped in the standard fashion. Cystoscopy was performed with a flexible cystoscope. The urethra, bladder neck and entire bladder was visualized in a standard fashion.  Ureteral orifices bilaterally, bladder mucosa grossly normal throughout, no abnormalities on retroflexion.  Cytology sent.  Imaging: CT abdomen and pelvis with contrast x2 normal in the last few months  Findings: Normal cystoscopy  Assessment and Plan: Call with cytology results  Continue topical estrogen cream and cranberry tablets for UTI prevention  Nickolas Madrid, MD 02/08/2021

## 2021-02-08 NOTE — Addendum Note (Signed)
Addended by: Donalee Citrin on: 02/08/2021 12:04 PM   Modules accepted: Orders

## 2021-02-10 LAB — CYTOLOGY - NON PAP

## 2021-02-15 ENCOUNTER — Encounter: Payer: Self-pay | Admitting: Emergency Medicine

## 2021-02-15 ENCOUNTER — Other Ambulatory Visit: Payer: Self-pay

## 2021-02-15 ENCOUNTER — Emergency Department
Admission: EM | Admit: 2021-02-15 | Discharge: 2021-02-15 | Disposition: A | Discharge status: Home / Self Care | Payer: Medicare HMO | Attending: Emergency Medicine | Admitting: Emergency Medicine

## 2021-02-15 ENCOUNTER — Emergency Department: Payer: Medicare HMO

## 2021-02-15 DIAGNOSIS — Z8616 Personal history of COVID-19: Secondary | ICD-10-CM | POA: Diagnosis not present

## 2021-02-15 DIAGNOSIS — M47816 Spondylosis without myelopathy or radiculopathy, lumbar region: Secondary | ICD-10-CM | POA: Diagnosis not present

## 2021-02-15 DIAGNOSIS — R1084 Generalized abdominal pain: Secondary | ICD-10-CM | POA: Diagnosis not present

## 2021-02-15 DIAGNOSIS — I700 Atherosclerosis of aorta: Secondary | ICD-10-CM | POA: Diagnosis not present

## 2021-02-15 DIAGNOSIS — R002 Palpitations: Secondary | ICD-10-CM

## 2021-02-15 DIAGNOSIS — Z8542 Personal history of malignant neoplasm of other parts of uterus: Secondary | ICD-10-CM | POA: Diagnosis not present

## 2021-02-15 DIAGNOSIS — K219 Gastro-esophageal reflux disease without esophagitis: Secondary | ICD-10-CM | POA: Diagnosis not present

## 2021-02-15 DIAGNOSIS — Z79899 Other long term (current) drug therapy: Secondary | ICD-10-CM | POA: Insufficient documentation

## 2021-02-15 DIAGNOSIS — R109 Unspecified abdominal pain: Secondary | ICD-10-CM | POA: Diagnosis not present

## 2021-02-15 DIAGNOSIS — I10 Essential (primary) hypertension: Secondary | ICD-10-CM | POA: Diagnosis not present

## 2021-02-15 DIAGNOSIS — Z9049 Acquired absence of other specified parts of digestive tract: Secondary | ICD-10-CM | POA: Diagnosis not present

## 2021-02-15 LAB — CBC
HCT: 37.7 % (ref 36.0–46.0)
Hemoglobin: 13 g/dL (ref 12.0–15.0)
MCH: 33.3 pg (ref 26.0–34.0)
MCHC: 34.5 g/dL (ref 30.0–36.0)
MCV: 96.7 fL (ref 80.0–100.0)
Platelets: 234 10*3/uL (ref 150–400)
RBC: 3.9 MIL/uL (ref 3.87–5.11)
RDW: 14 % (ref 11.5–15.5)
WBC: 5.4 10*3/uL (ref 4.0–10.5)
nRBC: 0 % (ref 0.0–0.2)

## 2021-02-15 LAB — BASIC METABOLIC PANEL
Anion gap: 7 (ref 5–15)
BUN: 12 mg/dL (ref 8–23)
CO2: 28 mmol/L (ref 22–32)
Calcium: 9.5 mg/dL (ref 8.9–10.3)
Chloride: 106 mmol/L (ref 98–111)
Creatinine, Ser: 0.75 mg/dL (ref 0.44–1.00)
GFR, Estimated: >60 mL/min (ref >60)
Glucose, Bld: 115 mg/dL — ABNORMAL HIGH (ref 70–99)
Potassium: 3.7 mmol/L (ref 3.5–5.1)
Sodium: 141 mmol/L (ref 135–145)

## 2021-02-15 NOTE — ED Triage Notes (Signed)
C/O palpitations today when standing today.  Took metoprolol and losartan, and symptoms have resolved.

## 2021-02-15 NOTE — ED Provider Notes (Signed)
[facility]  ____________________________________________   Event Date/Time   First MD Initiated Contact with Patient 02/15/21 1429     (approximate)  I have reviewed the triage vital signs and the nursing notes.   HISTORY  Chief Complaint Palpitations    HPI Valerie [name] is a 70 y.o. female past medical history of uterine cancer, acid reflux, hyperlipidemia, palpitations hypertension who presents with an episode of palpitations.  Patient got up this morning was in her usual state of health when she suddenly felt like her heart rate went up.  She checked her blood pressure and heart rate and her heart rate was in the 120s.  She took her metoprolol and symptoms eventually resolved.  She had no associated chest pain, shortness of breath, nausea vomiting.  Patient also notes that she has had some diffuse abdominal pain over the past 3 days.  She denies nausea vomiting.  She is still tolerating p.o. and has had normal bowel movements.  However she does state that this feels somewhat similar to when she had a bowel obstruction several months ago.         Past Medical History:  Diagnosis Date   Acid reflux    Adenocarcinoma of the endometrium/uterus ([place]) 12/04/2000   Anal fissure    Atrophic vaginitis    Difficult intubation    limited neck mobility   Esophagitis    Hernia    Present to proximal to umbillical    History of hiatal hernia    Hyperlipidemia    Hypertension    Internal hemorrhoids    Menorrhagia    Osteopenia    PONV (postoperative nausea and vomiting)    Vertigo    Vitamin D deficiency     Patient Active Problem List   Diagnosis Date Noted   Lactic acidosis    Dehydration    Partial small bowel obstruction ([place]) 01/26/2021   Epigastric pain    Depression    Acute cystitis with hematuria 12/08/2020   Microscopic hematuria 12/08/2020   COVID-19 virus infection 09/28/2020   Chronic constipation 08/13/2020   Dysuria 08/13/2020    Encounter for screening colonoscopy    Otalgia of both ears 01/28/2020   Tinnitus of both ears 10/02/2019   ETD (Eustachian tube dysfunction), right 04/29/2019   Weakness of both lower extremities 04/03/2019   Chronic back pain 04/03/2019   Encounter for annual general medical examination with abnormal findings in adult 02/14/2019   Vulvar dermatitis 01/01/2018   Allergic rhinitis 08/22/2017   Neck pain with history of cervical spinal surgery 01/22/2017   Prediabetes 11/16/2016   Hyperlipidemia 11/16/2015   Esophageal reflux    Hemorrhoid 06/30/2015   Atypical chest pain 01/04/2015   Palpitations 01/04/2015   Welcome to Medicare preventive visit 10/09/2014   Benign paroxysmal positional vertigo 08/31/2014   Hernia    Menorrhagia    Osteopenia    Vitamin D deficiency    Adenocarcinoma of the endometrium/uterus ([place])    Generalized anxiety disorder 01/23/2007   Essential hypertension 01/23/2007    Past Surgical History:  Procedure Laterality Date   24 HOUR [place] STUDY N/A 11/29/2015   Procedure: 24 HOUR PH STUDY;  Surgeon: Manus Gunning, MD;  Location: [facility];  Service: Gastroenterology;  Laterality: N/A;   ABDOMINAL HYSTERECTOMY  12/04/2000   also bilateral salpingo-oophorectomy   CARDIAC CATHETERIZATION  2006   CARPAL TUNNEL RELEASE  11/20/2011   Procedure: CARPAL TUNNEL RELEASE;  Surgeon: Elaina Hoops, MD;  Location: [facility];  Service: Neurosurgery;  Laterality: Left;  LEFT carpal tunnel release   CERVICAL SPINE SURGERY  2013   [place]   COLONOSCOPY WITH PROPOFOL N/A 07/27/2020   Procedure: COLONOSCOPY WITH PROPOFOL;  Surgeon: Lucilla Lame, MD;  Location: [facility] ENDOSCOPY;  Service: Endoscopy;  Laterality: N/A;   DILATION AND CURETTAGE OF UTERUS     11/15/2000   ESOPHAGEAL MANOMETRY N/A 09/20/2015   Procedure: ESOPHAGEAL MANOMETRY (EM);  Surgeon: Manus Gunning, MD;  Location: [facility] ENDOSCOPY;  Service: Gastroenterology;  Laterality: N/A;    HYSTEROSCOPY  11/15/2000   D & C, resection of endometrial polyps   OOPHORECTOMY  12/04/2000   bilateral salpingo-oophorectomy done with TAH   SPINAL CORD STIMULATOR INSERTION Bilateral 04/12/2020   Procedure: INSERTION CERVICAL SPINAL STIMULATOR PULSE GENERATOR;  Surgeon: [name], MD;  Location: [facility] ORS;  Service: Neurosurgery;  Laterality: Bilateral;   SPINAL CORD STIMULATOR TRIAL N/A 03/22/2020   Procedure: CERVICAL SPINAL CORD STIMULATOR TRIAL PERCUTANEOUS;  Surgeon: [name], MD;  Location: [facility] ORS;  Service: Neurosurgery;  Laterality: N/A;   TUBAL LIGATION      Prior to Admission medications   Medication Sig Start Date End Date Taking? Authorizing Provider  acetaminophen (TYLENOL) 500 MG tablet Take 500-1,000 mg by mouth every 6 (six) hours as needed (for pain).    [provider]  atorvastatin (LIPITOR) 20 MG tablet Take 20 mg by mouth daily. 12/15/20 12/15/21  [provider]  DULoxetine (CYMBALTA) 20 MG capsule Take 1 capsule (20 mg total) by mouth daily. For anxiety/depression/pain 08/19/20   Pleas Koch, NP  estradiol (ESTRACE) 0.1 MG/GM vaginal cream Estrogen Cream Instruction Discard applicator Apply pea sized amount to tip of finger to urethra before bed. Wash hands well after application. Use Monday, Wednesday and Friday 12/16/20   Billey Co, MD  losartan (COZAAR) 100 MG tablet Take 1 tablet once daily for blood pressure. 12/13/20   Pleas Koch, NP  metoprolol succinate (TOPROL-XL) 50 MG 24 hr tablet Take 1 tablet (50 mg total) by mouth daily. For heart rate/palpitations. 08/12/20   Pleas Koch, NP  omeprazole (PRILOSEC) 40 MG capsule TAKE 1 CAPSULE (40 MG TOTAL) BY MOUTH DAILY. FOR HEARTBURN. 08/24/20   Pleas Koch, NP  ondansetron (ZOFRAN) 4 MG tablet Take 1 tablet (4 mg total) by mouth every 6 (six) hours as needed for nausea. 01/28/21   Loletha Grayer, MD  PFIZER-BIONT COVID-19 VAC-TRIS SUSP injection  01/04/21    [provider]  [facility] injection  01/04/21   [provider]    Allergies Patient has no known allergies.  Family History  Problem Relation Age of Onset   Hypertension Father    Prostate cancer Father    Hypertension Sister    Diabetes Brother    Heart disease Brother    Hypertension Brother    Diabetes Brother    Heart disease Brother    Breast cancer Neg Hx     Social History Social History   Tobacco Use   Smoking status: Never   Smokeless tobacco: Never  Vaping Use   Vaping Use: Never used  Substance Use Topics   Alcohol use: No   Drug use: Never    Review of Systems   Review of Systems  Constitutional:  Negative for chills and fever.  Respiratory:  Negative for chest tightness and shortness of breath.   Cardiovascular:  Positive for palpitations. Negative for chest pain and  leg swelling.  Gastrointestinal:  Negative for abdominal pain, nausea and vomiting.  Genitourinary:  Negative for dysuria.  Neurological:  Negative for light-headedness.  All other systems reviewed and are negative.  Physical Exam Updated Vital Signs BP (!) 154/91 (BP Location: Right Arm)   Pulse 69   Temp 98.1 F (36.7 C) (Oral)   Resp 19   Ht 5\' 5"  (1.651 m)   Wt 66.6 kg   SpO2 96%   BMI 24.43 kg/m   Physical Exam   LABS (all labs ordered are listed, but only abnormal results are displayed)  Labs Reviewed  BASIC METABOLIC PANEL - Abnormal; Notable for the following components:      Result Value   Glucose, Bld 115 (*)    All other components within normal limits  CBC   ____________________________________________  EKG Normal sinus rhythm, normal axis, normal intervals, no acute ischemic changes ____________________________________________  RADIOLOGY I, Madelin Headings, personally viewed and evaluated these images (plain radiographs) as part of my medical decision making, as well as reviewing the written report by the radiologist.  ED MD  interpretation: I reviewed the CT abdomen pelvis which is negative for acute intra-abdominal process    ____________________________________________   PROCEDURES  Procedure(s) performed (including Critical Care):  Procedures   ____________________________________________   INITIAL IMPRESSION / ASSESSMENT AND PLAN / ED COURSE     Patient is a 70 year old female with prior palpitations who presents after a self resolving episode of palpitations today.  She had no associated symptoms with it including no lightheadedness or presyncope, chest pain or dyspnea.  Her EKG shows normal sinus rhythm.  Basic labs including electrolytes and CBC are normal.  Notably patient's other complaint today is abdominal pain.  Her abdomen is largely benign but she does have some diffuse tenderness.  Given she says this feels similar to prior bowel obstructions I will obtain a CT abdomen without contrast.  Overall my suspicion for obstruction is low.  CT abdomen pelvis is negative for acute pathology.  Patient stable for discharge.      ____________________________________________   FINAL CLINICAL IMPRESSION(S) / ED DIAGNOSES  Final diagnoses:  Palpitations     ED Discharge Orders     None        Note:  This document was prepared using Dragon voice recognition software and may include unintentional dictation errors.    Rada Hay, MD 02/15/21 [id number]

## 2021-02-15 NOTE — ED Notes (Signed)
First pt contact at this time. Pt NAD, a/ox4, states she took her BP and HR and they were both high so she came to the ED. Pt denies at any time, dizziness, headache, visual disturbance, CP, SOB. Pt does however c/o global ABD pain for some days with emphasis on RUQ. +pain on palp RUQ. Denies n/v/d, fever or GU symptoms. VSS

## 2021-02-15 NOTE — Discharge Instructions (Addendum)
Your blood work, EKG and CAT scan were all reassuring today.  Please follow-up with your cardiologist and your primary care provider for further evaluation and management of your palpitations.

## 2021-02-15 NOTE — ED Notes (Signed)
Pt NAD, a/ox4. Pt verbalizes understanding of all DC and f/u instructions. All questions answered. Pt walks with steady gait to lobby at DC.  ? ?

## 2021-02-21 ENCOUNTER — Ambulatory Visit: Payer: Medicare HMO | Admitting: Surgery

## 2021-02-21 ENCOUNTER — Other Ambulatory Visit: Payer: Self-pay

## 2021-02-21 ENCOUNTER — Encounter: Payer: Self-pay | Admitting: Surgery

## 2021-02-21 VITALS — BP 150/85 | HR 67 | Temp 98.0°F | Ht 65.0 in | Wt 147.6 lb

## 2021-02-21 DIAGNOSIS — K56600 Partial intestinal obstruction, unspecified as to cause: Secondary | ICD-10-CM | POA: Diagnosis not present

## 2021-02-21 NOTE — Patient Instructions (Addendum)
You should take a fiber supplement like Benefiber or Metamucil every day in a full glass of liquid 1-2 times a day. Be sure to drink plenty of fluids and water. Your urine should be pale to clear in color to be sure you are getting enough liquids.   We can refer you to Gastroenterology if you would like, just let us know.   Follow-up with our office as needed.  Please call and ask to speak with a nurse if you develop questions or concerns.   High-Fiber Eating Plan Fiber, also called dietary fiber, is a type of carbohydrate. It is found foods such as fruits, vegetables, whole grains, and beans. A high-fiber diet can have many health benefits. Your health care provider may recommend a high-fiber diet to help: Prevent constipation. Fiber can make your bowel movements more regular. Lower your cholesterol. Relieve the following conditions: Inflammation of veins in the anus (hemorrhoids). Inflammation of specific areas of the digestive tract (uncomplicated diverticulosis). A problem of the large intestine, also called the colon, that sometimes causes pain and diarrhea (irritable bowel syndrome, or IBS). Prevent overeating as part of a weight-loss plan. Prevent heart disease, type 2 diabetes, and certain cancers. What are tips for following this plan? Reading food labels  Check the nutrition facts label on food products for the amount of dietary fiber. Choose foods that have 5 grams of fiber or more per serving. The goals for recommended daily fiber intake include: Men (age 59 or younger): 34-38 g. Men (over age 40): 28-34 g. Women (age 35 or younger): 25-28 g. Women (over age 62): 22-25 g. Your daily fiber goal is _____________ g. Shopping Choose whole fruits and vegetables instead of processed forms, such as apple juice or applesauce. Choose a wide variety of high-fiber foods such as avocados, lentils, oats, and kidney beans. Read the nutrition facts label of the foods you choose. Be aware  of foods with added fiber. These foods often have high sugar and sodium amounts per serving. Cooking Use whole-grain flour for baking and cooking. Cook with brown rice instead of white rice. Meal planning Start the day with a breakfast that is high in fiber, such as a cereal that contains 5 g of fiber or more per serving. Eat breads and cereals that are made with whole-grain flour instead of refined flour or white flour. Eat brown rice, bulgur wheat, or millet instead of white rice. Use beans in place of meat in soups, salads, and pasta dishes. Be sure that half of the grains you eat each day are whole grains. General information You can get the recommended daily intake of dietary fiber by: Eating a variety of fruits, vegetables, grains, nuts, and beans. Taking a fiber supplement if you are not able to take in enough fiber in your diet. It is better to get fiber through food than from a supplement. Gradually increase how much fiber you consume. If you increase your intake of dietary fiber too quickly, you may have bloating, cramping, or gas. Drink plenty of water to help you digest fiber. Choose high-fiber snacks, such as berries, raw vegetables, nuts, and popcorn. What foods should I eat? Fruits Berries. Pears. Apples. Oranges. Avocado. Prunes and raisins. Dried figs. Vegetables Sweet potatoes. Spinach. Kale. Artichokes. Cabbage. Broccoli. Cauliflower. Green peas. Carrots. Squash. Grains Whole-grain breads. Multigrain cereal. Oats and oatmeal. Brown rice. Barley. Bulgur wheat. [place]. Quinoa. Bran muffins. Popcorn. Rye wafer crackers. Meats and other proteins Navy beans, kidney beans, and pinto beans. Soybeans. Split  peas. Lentils. Nuts and seeds. Dairy Fiber-fortified yogurt. Beverages Fiber-fortified soy milk. Fiber-fortified orange juice. Other foods Fiber bars. The items listed above may not be a complete list of recommended foods and beverages. Contact a dietitian for more  information. What foods should I avoid? Fruits Fruit juice. Cooked, strained fruit. Vegetables Fried potatoes. Canned vegetables. Well-cooked vegetables. Grains White bread. Pasta made with refined flour. White rice. Meats and other proteins Fatty cuts of meat. Fried chicken or fried fish. Dairy Milk. Yogurt. Cream cheese. Sour cream. Fats and oils Butters. Beverages Soft drinks. Other foods Cakes and pastries. The items listed above may not be a complete list of foods and beverages to avoid. Talk with your dietitian about what choices are best for you. Summary Fiber is a type of carbohydrate. It is found in foods such as fruits, vegetables, whole grains, and beans. A high-fiber diet has many benefits. It can help to prevent constipation, lower blood cholesterol, aid weight loss, and reduce your risk of heart disease, diabetes, and certain cancers. Increase your intake of fiber gradually. Increasing fiber too quickly may cause cramping, bloating, and gas. Drink plenty of water while you increase the amount of fiber you consume. The best sources of fiber include whole fruits and vegetables, whole grains, nuts, seeds, and beans. This information is not intended to replace advice given to you by your health care provider. Make sure you discuss any questions you have with your health care provider. Document Revised: 09/18/2019 Document Reviewed: 09/18/2019 Elsevier Patient Education  2022 [employer].

## 2021-02-21 NOTE — Progress Notes (Signed)
Outpatient Surgical Follow Up  02/21/2021  Valerie [name] is an 70 y.o. female.   Chief Complaint  Patient presents with   New Patient (Initial Visit)    HPI: Is a 70 year old female well-known to me with a prior history of partial small bowel obstruction that resolved with medical management.  She did have a history of cholecystectomy in the distant past, hysterectomy and tubal ligation.  She continues to have intermittent colicky abdominal pain as well as constipation.  She had a repeat CT scan after the bowel obstruction showed no evidence of acute intra-abdominal pathology.  There is no evidence of transition areas.  She states that her pain is colicky and moderate in intensity.  There is no specific aggravating or elevating factors.  She had a colonoscopy March 2022 that was normal.  Past Medical History:  Diagnosis Date   Acid reflux    Adenocarcinoma of the endometrium/uterus ([place]) 12/04/2000   Anal fissure    Atrophic vaginitis    Difficult intubation    limited neck mobility   Esophagitis    Hernia    Present to proximal to umbillical    History of hiatal hernia    Hyperlipidemia    Hypertension    Internal hemorrhoids    Menorrhagia    Osteopenia    PONV (postoperative nausea and vomiting)    Vertigo    Vitamin D deficiency     Past Surgical History:  Procedure Laterality Date   58 HOUR [place] STUDY N/A 11/29/2015   Procedure: 24 HOUR [place] STUDY;  Surgeon: Manus Gunning, MD;  Location: [facility];  Service: Gastroenterology;  Laterality: N/A;   ABDOMINAL HYSTERECTOMY  12/04/2000   also bilateral salpingo-oophorectomy   CARDIAC CATHETERIZATION  2006   CARPAL TUNNEL RELEASE  11/20/2011   Procedure: CARPAL TUNNEL RELEASE;  Surgeon: Elaina Hoops, MD;  Location: [place] [facility];  Service: Neurosurgery;  Laterality: Left;  LEFT carpal tunnel release   CERVICAL SPINE SURGERY  2013   [place]   COLONOSCOPY WITH PROPOFOL N/A 07/27/2020   Procedure:  COLONOSCOPY WITH PROPOFOL;  Surgeon: Lucilla Lame, MD;  Location: [facility] ENDOSCOPY;  Service: Endoscopy;  Laterality: N/A;   DILATION AND CURETTAGE OF UTERUS     11/15/2000   ESOPHAGEAL MANOMETRY N/A 09/20/2015   Procedure: ESOPHAGEAL MANOMETRY (EM);  Surgeon: Manus Gunning, MD;  Location: [facility] ENDOS[facility];  Service: Gastroenterology;  Laterality: N/A;   HYSTEROSCOPY  11/15/2000   D & C, resection of endometrial polyps   OOPHORECTOMY  12/04/2000   bilateral salpingo-oophorectomy done with TAH   SPINAL CORD STIMULATOR INSERTION Bilateral 04/12/2020   Procedure: INSERTION CERVICAL SPINAL STIMULATOR PULSE GENERATOR;  Surgeon: [name], MD;  Location: [facility] ORS;  Service: Neurosurgery;  Laterality: Bilateral;   SPINAL CORD STIMULATOR TRIAL N/A 03/22/2020   Procedure: CERVICAL SPINAL CORD STIMULATOR TRIAL PERCUTANEOUS;  Surgeon: [name], MD;  Location: [facility] ORS;  Service: Neurosurgery;  Laterality: N/A;   TUBAL LIGATION      Family History  Problem Relation Age of Onset   Hypertension Father    Prostate cancer Father    Hypertension Sister    Lung cancer Sister    Diabetes Brother    Heart disease Brother    Hypertension Brother    Diabetes Brother    Heart disease Brother    Breast cancer Neg Hx     Social History:  reports that she has never smoked. She has never used smokeless tobacco.  She reports that she does not drink alcohol and does not use drugs.  Allergies: No Known Allergies  Medications reviewed.    ROS Full ROS performed and is otherwise negative other than what is stated in HPI   BP (!) 150/85   Pulse 67   Temp 98 F (36.7 C)   Ht 5\' 5"  (1.651 m)   Wt 147 lb 9.6 oz (67 kg)   SpO2 94%   BMI 24.56 kg/m   Physical Exam Vitals and nursing note reviewed. Exam conducted with a chaperone present.  Constitutional:      General: She is not in acute distress.    Appearance: Normal appearance.  Eyes:     General: No scleral icterus.       Right eye:  No discharge.        Left eye: No discharge.  Cardiovascular:     Rate and Rhythm: Normal rate and regular rhythm.     Heart sounds: No murmur heard. Pulmonary:     Effort: Pulmonary effort is normal. No respiratory distress.     Breath sounds: Normal breath sounds. No stridor.  Abdominal:     General: Abdomen is flat. There is no distension.     Palpations: Abdomen is soft. There is no mass.     Tenderness: There is abdominal tenderness. There is no guarding or rebound.     Hernia: No hernia is present.     Comments: Diffuse tenderness to palpation especially in her right lower quadrant.  No peritonitis  Musculoskeletal:        General: Normal range of motion.     Cervical back: Normal range of motion and neck supple. No rigidity or tenderness.  Skin:    General: Skin is warm and dry.     Capillary Refill: Capillary refill takes less than 2 seconds.  Neurological:     General: No focal deficit present.     Mental Status: She is alert and oriented to person, place, and time.  Psychiatric:        Mood and Affect: Mood normal.        Behavior: Behavior normal.        Thought Content: Thought content normal.        Judgment: Judgment normal.     Assessment/Plan: 70 year old female, with prior cholecystectomy and chronic abdominal pain as well as constipation.  This is more of a functional issue.  Discussed with patient in detail about importance of fiber supplementation.  At this time there is nothing specific that we will require any surgical intervention.  She has seen GI and if fiber does not help she wishes to go back to GI for evaluation of functional chronic pain.   Greater than 50% of the 30 minutes  visit was spent in counseling/coordination of care   Caroleen Hamman, MD [facility]

## 2021-02-23 ENCOUNTER — Encounter: Payer: Self-pay | Admitting: Primary Care

## 2021-02-23 ENCOUNTER — Ambulatory Visit (INDEPENDENT_AMBULATORY_CARE_PROVIDER_SITE_OTHER): Payer: Medicare HMO | Admitting: Primary Care

## 2021-02-23 ENCOUNTER — Other Ambulatory Visit: Payer: Self-pay

## 2021-02-23 VITALS — BP 140/82 | HR 75 | Temp 98.6°F | Ht 65.0 in | Wt 147.0 lb

## 2021-02-23 DIAGNOSIS — K56600 Partial intestinal obstruction, unspecified as to cause: Secondary | ICD-10-CM

## 2021-02-23 DIAGNOSIS — Z23 Encounter for immunization: Secondary | ICD-10-CM | POA: Diagnosis not present

## 2021-02-23 DIAGNOSIS — R002 Palpitations: Secondary | ICD-10-CM | POA: Diagnosis not present

## 2021-02-23 NOTE — Assessment & Plan Note (Signed)
Resolved.  Overall appears to be doing well. Exam today with bowel sounds throughout.   Discussed to work on hydration with water, advance diet as tolerated. Follow up with general surgeon as needed.'  Hospital notes, labs, imaging reviewed.

## 2021-02-23 NOTE — Assessment & Plan Note (Addendum)
Evaluated by cardiology previously. ECG without atrial fibrillation, normal heart sounds today.  Discussed to notify cardiology or [place] if palpitations returned.   ED notes, labs, imaging reviewed.

## 2021-02-23 NOTE — Progress Notes (Signed)
Subjective:    Patient ID: Valerie [name], female    DOB: [birth year], 70 y.o.   MRN: [id number]  HPI  Valerie [name] is a very pleasant 70 y.o. female with a history of GERD, hypertension, hyperlipidemia, osteoporosis who presents today for hospital and ED follow up.  She presented to [facility] ED on 01/26/21 for acute epigastric and right sided abdominal pain with vomiting and dysuria. Work up in the ED showed partial small bowel obstruction. She was provided with IV fluids, pain medication, and admitted for further evaluation.   During her hospital stay she underwent NG tube placement. General surgery consulted, no further work up recommended as she was noted to have flatus on xray, feeling better. Prior to her discharge she was having regular bowel movements and tolerating solid food. She was discharged home on 01/28/21 with recommendation for general surgery follow up.  Since her discharge home she's been evaluated at [facility] ED on 02/15/21 for palpitations with HR in the 120's. Also with diffuse abdominal pain for 3 days. Work up in the ED without acute cause for symptoms. CT scan of abdomen negative. She was discharged home later that day .  Since her hospital admission and ED visit she's followed up with her general surgeon. She continues to feel nauseated, was prescribed anti-nausea medications. Not much appetite. She is passing gas, history of constipation, last bowel movement was yesterday.   She denies fevers, vomiting, palpitations. Overall she's feeling much better. She follows with cardiology, last visit was in July 2022.   Review of Systems  Constitutional:  Positive for appetite change. Negative for fever.  Respiratory:  Negative for shortness of breath.   Cardiovascular:  Negative for chest pain and palpitations.  Gastrointestinal:  Positive for abdominal pain and nausea. Negative for blood in stool and vomiting.        Past Medical History:  Diagnosis Date   Acid reflux     Adenocarcinoma of the endometrium/uterus ([place]) 12/04/2000   Anal fissure    Atrophic vaginitis    Difficult intubation    limited neck mobility   Esophagitis    Hernia    Present to proximal to umbillical    History of hiatal hernia    Hyperlipidemia    Hypertension    Internal hemorrhoids    Menorrhagia    Osteopenia    PONV (postoperative nausea and vomiting)    Vertigo    Vitamin D deficiency     Social History   Socioeconomic History   Marital status: Married    Spouse name: Not on file   Number of children: Not on file   Years of education: Not on file   Highest education level: Not on file  Occupational History   Not on file  Tobacco Use   Smoking status: Never   Smokeless tobacco: Never  Vaping Use   Vaping Use: Never used  Substance and Sexual Activity   Alcohol use: No   Drug use: Never   Sexual activity: Yes    Birth control/protection: Post-menopausal, Surgical  Other Topics Concern   Not on file  Social History Narrative   Married   Lives in [place]   Has 2 children, 4 grandchildren   Enjoys walking, shopping.   Social Determinants of Health   Financial Resource Strain: Not on file  Food Insecurity: Not on file  Transportation Needs: Not on file  Physical Activity: Not on file  Stress: Not on file  Social Connections: Not on  file  Intimate Partner Violence: Not on file    Past Surgical History:  Procedure Laterality Date   41 HOUR [place] STUDY N/A 11/29/2015   Procedure: 24 HOUR [place] STUDY;  Surgeon: Manus Gunning, MD;  Location: [facility] END[facility];  Service: Gastroenterology;  Laterality: N/A;   ABDOMINAL HYSTERECTOMY  12/04/2000   also bilateral salpingo-oophorectomy   CARDIAC CATHETERIZATION  2006   CARPAL TUNNEL RELEASE  11/20/2011   Procedure: CARPAL TUNNEL RELEASE;  Surgeon: Elaina Hoops, MD;  Location: [place] [facility]URO ORS;  Service: Neurosurgery;  Laterality: Left;  LEFT carpal tunnel release   CERVICAL SPINE SURGERY  2013   [place]   COLONOSCOPY WITH PROPOFOL N/A 07/27/2020   Procedure: COLONOSCOPY WITH PROPOFOL;  Surgeon: Lucilla Lame, MD;  Location: [facility] ENDOSCOPY;  Service: Endoscopy;  Laterality: N/A;   DILATION AND CURETTAGE OF UTERUS     11/15/2000   ESOPHAGEAL MANOMETRY N/A 09/20/2015   Procedure: ESOPHAGEAL MANOMETRY (EM);  Surgeon: Manus Gunning, MD;  Location: [facility] ENDOSCOPY;  Service: Gastroenterology;  Laterality: N/A;   HYSTEROSCOPY  11/15/2000   D & C, resection of endometrial polyps   OOPHORECTOMY  12/04/2000   bilateral salpingo-oophorectomy done with TAH   SPINAL CORD STIMULATOR INSERTION Bilateral 04/12/2020   Procedure: INSERTION CERVICAL SPINAL STIMULATOR PULSE GENERATOR;  Surgeon: [name], MD;  Location: [facility] ORS;  Service: Neurosurgery;  Laterality: Bilateral;   SPINAL CORD STIMULATOR TRIAL N/A 03/22/2020   Procedure: CERVICAL SPINAL CORD STIMULATOR TRIAL PERCUTANEOUS;  Surgeon: [name], MD;  Location: [facility] ORS;  Service: Neurosurgery;  Laterality: N/A;   TUBAL LIGATION      Family History  Problem Relation Age of Onset   Hypertension Father    Prostate cancer Father    Hypertension Sister    Lung cancer Sister    Diabetes Brother    Heart disease Brother    Hypertension Brother    Diabetes Brother    Heart disease Brother    Breast cancer Neg Hx     No Known Allergies  Current Outpatient Medications on File Prior to Visit  Medication Sig Dispense Refill   acetaminophen (TYLENOL) 500 MG tablet Take 500-1,000 mg by mouth every 6 (six) hours as needed (for pain).     atorvastatin (LIPITOR) 20 MG tablet Take 20 mg by mouth daily.     DULoxetine (CYMBALTA) 20 MG capsule Take 1 capsule (20 mg total) by mouth daily. For anxiety/depression/pain 90 capsule 3   estradiol (ESTRACE) 0.1 MG/GM vaginal cream Estrogen Cream Instruction Discard applicator Apply pea sized amount to tip of finger to urethra before bed. Wash hands well after application. Use Monday,  Wednesday and Friday 42.5 g 3   losartan (COZAAR) 100 MG tablet Take 1 tablet once daily for blood pressure. 90 tablet 1   metoprolol succinate (TOPROL-XL) 50 MG 24 hr tablet Take 1 tablet (50 mg total) by mouth daily. For heart rate/palpitations. 90 tablet 3   omeprazole (PRILOSEC) 40 MG capsule TAKE 1 CAPSULE (40 MG TOTAL) BY MOUTH DAILY. FOR HEARTBURN. 90 capsule 3   ondansetron (ZOFRAN) 4 MG tablet Take 1 tablet (4 mg total) by mouth every 6 (six) hours as needed for nausea. 20 tablet 0   PFIZER-BIONT COVID-19 VAC-TRIS SUSP injection      SHINGRIX injection      No current facility-administered medications on file prior to visit.    BP 140/82   Pulse 75   Temp 98.6 F (37 C) (Temporal)  Ht 5\' 5"  (1.651 m)   Wt 147 lb (66.7 kg)   SpO2 98%   BMI 24.46 kg/m  Objective:   Physical Exam Constitutional:      General: She is not in acute distress.    Appearance: She is not ill-appearing.  Cardiovascular:     Rate and Rhythm: Normal rate and regular rhythm.  Pulmonary:     Effort: Pulmonary effort is normal.     Breath sounds: Normal breath sounds.  Abdominal:     Palpations: Abdomen is soft.     Tenderness: There is abdominal tenderness in the epigastric area and periumbilical area.  Musculoskeletal:     Cervical back: Neck supple.  Skin:    General: Skin is warm and dry.  Neurological:     Mental Status: She is alert.          Assessment & Plan:      This visit occurred during the SARS-CoV-2 public health emergency.  Safety protocols were in place, including screening questions prior to the visit, additional usage of staff PPE, and extensive cleaning of exam room while observing appropriate contact time as indicated for disinfecting solutions.

## 2021-02-23 NOTE — Patient Instructions (Signed)
Continue to work on hydration with water.  Slowly advance your diet as tolerated.  Notify the heart doctor if you develop the fast heartbeats again.  It was a pleasure to see you today!

## 2021-02-26 ENCOUNTER — Other Ambulatory Visit: Payer: Self-pay | Admitting: Primary Care

## 2021-02-26 DIAGNOSIS — I10 Essential (primary) hypertension: Secondary | ICD-10-CM

## 2021-02-26 DIAGNOSIS — E785 Hyperlipidemia, unspecified: Secondary | ICD-10-CM

## 2021-02-27 NOTE — Telephone Encounter (Signed)
It appears that a CMA from Urology changed her atorvastatin prescription from 10 mg to 20 mg. This is for cholesterol.   Please call patient and clarify what she is taking. Not sure why this was changed as a CMA cannot implement this change.   If she is taking atorvastatin 10 mg, which is what I prescribed, then please send refills of both medications as pended.

## 2021-02-28 NOTE — Telephone Encounter (Signed)
Left message to return call to our office.  Also received refill on Omeprazole need to verify as well.

## 2021-02-28 NOTE — Telephone Encounter (Signed)
Pt called returning your call 

## 2021-02-28 NOTE — Telephone Encounter (Signed)
Called patient states that Urology increased to 20 mg about a month ago. She got refill in the mail for 10mg  not sure what you want her to stay on. She states that they did labs and told her that her cholesterol was high.

## 2021-02-28 NOTE — Telephone Encounter (Signed)
Left message to return call to our office.  

## 2021-03-01 NOTE — Telephone Encounter (Signed)
I'm not sure why Urology would collect a lipid panel, but I need the lab report they collected.   Did they actually prescribe a 20 mg tablet and send this to her pharmacy?   Continue taking the 10 mg until we can figure out what's going on.

## 2021-03-07 ENCOUNTER — Telehealth: Payer: Self-pay | Admitting: Primary Care

## 2021-03-07 NOTE — Telephone Encounter (Signed)
Left message to return call to our office.  

## 2021-03-07 NOTE — Telephone Encounter (Signed)
I have called patient she will continue on the 10mg . States that 20mg  was never called in from Urology.

## 2021-03-07 NOTE — Telephone Encounter (Signed)
Pt called stating that she would like for you to give her a call [phone].

## 2021-03-07 NOTE — Telephone Encounter (Signed)
Duplicate message see other message for documentation

## 2021-03-15 ENCOUNTER — Other Ambulatory Visit: Payer: Self-pay

## 2021-03-15 DIAGNOSIS — R1013 Epigastric pain: Secondary | ICD-10-CM

## 2021-03-15 DIAGNOSIS — K56600 Partial intestinal obstruction, unspecified as to cause: Secondary | ICD-10-CM

## 2021-03-15 DIAGNOSIS — K5900 Constipation, unspecified: Secondary | ICD-10-CM

## 2021-03-15 NOTE — Progress Notes (Signed)
Pt called wanting a referral to GI. I placed a referral to [facility]. They will call pt to schedule an appointment.

## 2021-03-24 DIAGNOSIS — I10 Essential (primary) hypertension: Secondary | ICD-10-CM | POA: Diagnosis not present

## 2021-03-24 DIAGNOSIS — E782 Mixed hyperlipidemia: Secondary | ICD-10-CM | POA: Diagnosis not present

## 2021-03-24 DIAGNOSIS — R002 Palpitations: Secondary | ICD-10-CM | POA: Diagnosis not present

## 2021-03-28 ENCOUNTER — Telehealth: Payer: Self-pay | Admitting: Primary Care

## 2021-03-28 NOTE — Telephone Encounter (Signed)
Pt called stating that medication was denied by provider and provider has to call [employer] to give authorization-losartan

## 2021-03-29 ENCOUNTER — Other Ambulatory Visit: Payer: Self-pay

## 2021-03-29 DIAGNOSIS — I10 Essential (primary) hypertension: Secondary | ICD-10-CM

## 2021-03-29 MED ORDER — LOSARTAN POTASSIUM 100 MG PO TABS: ORAL_TABLET | ORAL | 1 refills | Status: DC

## 2021-04-04 ENCOUNTER — Other Ambulatory Visit: Payer: Self-pay

## 2021-04-04 ENCOUNTER — Ambulatory Visit: Payer: Medicare HMO | Admitting: Gastroenterology

## 2021-04-04 VITALS — BP 151/80 | HR 70 | Temp 97.7°F | Ht 65.0 in | Wt 147.6 lb

## 2021-04-04 DIAGNOSIS — R1013 Epigastric pain: Secondary | ICD-10-CM | POA: Diagnosis not present

## 2021-04-04 DIAGNOSIS — K5900 Constipation, unspecified: Secondary | ICD-10-CM | POA: Diagnosis not present

## 2021-04-04 DIAGNOSIS — K219 Gastro-esophageal reflux disease without esophagitis: Secondary | ICD-10-CM | POA: Diagnosis not present

## 2021-04-04 MED ORDER — LINACLOTIDE 145 MCG PO CAPS: 145.0000 ug | ORAL_CAPSULE | Freq: Every day | ORAL | 2 refills | Status: DC

## 2021-04-04 MED ORDER — OMEPRAZOLE 40 MG PO CPDR: 40.0000 mg | DELAYED_RELEASE_CAPSULE | Freq: Two times a day (BID) | ORAL | 0 refills | Status: DC

## 2021-04-04 NOTE — Progress Notes (Signed)
Valerie [name]  [name], [place] [address]  Main: [phone]  Fax: [phone]   Gastroenterology Consultation  Referring Provider:     Jules Husbands, MD Primary Care Physician:  Pleas Koch, NP Reason for Consultation:     Abdominal pain        HPI:    Chief Complaint  Patient presents with   New Patient (Initial Visit)   Constipation    BM 2 times a week with some relief with Ex lax   Abdominal Pain    Epigastric w/ radiation to RUQ    Valerie VANOVERBEKE is a 70 y.o. y/o female referred for consultation & management  by Dr. Carlis Abbott, Leticia Penna, NP.  This is a patient previously seen by gastroenterologist, Dr. Ipswich Cellar, and diagnosed with longstanding reflux, being referred to [place] at this time by Dr. Dahlia Byes for epigastric pain and constipation.  Patient was admitted in September 2022, discharge summary and Dr. Corlis Leak clinic note reviewed, and patient was diagnosed with partial small bowel obstruction at that time, that was conservatively managed.  CT showed previous evidence of cholecystectomy, with no other acute findings.  Patient describes having chronic constipation and takes Ex-Lax as needed and has a bowel movement only about once or twice a week.  No blood in stool.  Reports having epigastric pain, daily breakthrough heartburn symptoms despite PPI therapy, associated with nausea and vomiting, but no hematemesis.  No dysphagia  Most recent colonoscopy in March 2022 by Dr. Allen Norris for screening was normal except for internal hemorrhoids and repeat recommended in 10 years.  As per previous 2017 notes by Dr. Havery Moros patient underwent pH study, manometry and her symptoms were thought to be due to functional heartburn and a trial of TCA, desipramine was recommended.  Patient does not recall if this helped or not.  As per his notes pH testing results as follows  "Results as follows: Study performed ON PPI   Results: - Study time  of 24 hours - adequate study time - Demeester score of 1.5 (14.72 = upper limit of normal) - no pathologic acid reflux - Symptom index of 0% for reflux episodes (none of the 10 episodes of reported symptoms were due to reflux)   Summary and Interpretation: - this is a normal study without pathologic reflux. Study shows reflux is well controlled on present dose of medications - none of the patient's reported symptoms were due to reflux - this study is consistent with functional heartburn if the patient continues to have heartburn symptoms     I will contact the patient with results and further recommendations, I would consider a trial of TCA to treat functional heartburn"  His clinic notes also state:  "Prior endoscopic evaluation: EGD 2005 - LA grade A esophagitis, empiric dilation to 20mm [facility] Colonoscopy done in [place] a few years ago, she thinks she may have had a polyp removed  EGD done on 08/30/15 showing mild LA Grade A esophagitis. Otherwise a normal study   Esophageal manometry done showing hypotensive EG junction with evidence of relaxation after deglutition. Findings suggestive of Fragmented Peristalsis, otherwise no other significant esophageal peristaltic abnormality was detected on this study based on [place] classification.  Manometry showed fragmented peristalsis and hypotensive GEJ. I suspect the manometry finding may be causing her dysphagia, as no pathology is noted otherwise, and she does not have any symptoms of oropharyngeal dysphagia. I discussed with her that she has a nonspecific  mild motility disorder, but unfortunately no good medical therapy to help this at present time. Her hypotensive GEJ may be driving her refractory reflux symptoms."  Past Medical History:  Diagnosis Date   Acid reflux    Adenocarcinoma of the endometrium/uterus ([place]) 12/04/2000   Anal fissure    Atrophic vaginitis    Difficult intubation    limited neck mobility   Esophagitis     Hernia    Present to proximal to umbillical    History of hiatal hernia    Hyperlipidemia    Hypertension    Internal hemorrhoids    Menorrhagia    Osteopenia    PONV (postoperative nausea and vomiting)    Vertigo    Vitamin D deficiency     Past Surgical History:  Procedure Laterality Date   44 HOUR [place] STUDY N/A 11/29/2015   Procedure: 24 HOUR [place] STUDY;  Surgeon: Manus Gunning, MD;  Location: [facility];  Service: Gastroenterology;  Laterality: N/A;   ABDOMINAL HYSTERECTOMY  12/04/2000   also bilateral salpingo-oophorectomy   CARDIAC CATHETERIZATION  2006   CARPAL TUNNEL RELEASE  11/20/2011   Procedure: CARPAL TUNNEL RELEASE;  Surgeon: Elaina Hoops, MD;  Location: [facility];  Service: Neurosurgery;  Laterality: Left;  LEFT carpal tunnel release   CERVICAL SPINE SURGERY  2013   [place]   COLONOSCOPY WITH PROPOFOL N/A 07/27/2020   Procedure: COLONOSCOPY WITH PROPOFOL;  Surgeon: Lucilla Lame, MD;  Location: [facility] ENDOSCOPY;  Service: Endoscopy;  Laterality: N/A;   DILATION AND CURETTAGE OF UTERUS     11/15/2000   ESOPHAGEAL MANOMETRY N/A 09/20/2015   Procedure: ESOPHAGEAL MANOMETRY (EM);  Surgeon: Manus Gunning, MD;  Location: [facility];  Service: Gastroenterology;  Laterality: N/A;   HYSTEROSCOPY  11/15/2000   D & C, resection of endometrial polyps   OOPHORECTOMY  12/04/2000   bilateral salpingo-oophorectomy done with TAH   SPINAL CORD STIMULATOR INSERTION Bilateral 04/12/2020   Procedure: INSERTION CERVICAL SPINAL STIMULATOR PULSE GENERATOR;  Surgeon: [name], MD;  Location: [facility] ORS;  Service: Neurosurgery;  Laterality: Bilateral;   SPINAL CORD STIMULATOR TRIAL N/A 03/22/2020   Procedure: CERVICAL SPINAL CORD STIMULATOR TRIAL PERCUTANEOUS;  Surgeon: [name], MD;  Location: [facility] ORS;  Service: Neurosurgery;  Laterality: N/A;   TUBAL LIGATION      Prior to Admission medications   Medication Sig Start Date End Date Taking?  Authorizing Provider  acetaminophen (TYLENOL) 500 MG tablet Take 500-1,000 mg by mouth every 6 (six) hours as needed (for pain).   Yes [provider]  atorvastatin (LIPITOR) 20 MG tablet Take 20 mg by mouth daily. 12/15/20 12/15/21 Yes [provider]  DULoxetine (CYMBALTA) 20 MG capsule Take 1 capsule (20 mg total) by mouth daily. For anxiety/depression/pain 08/19/20  Yes Pleas Koch, NP  linaclotide [facility]) 145 MCG CAPS capsule Take 1 capsule (145 mcg total) by mouth daily. 04/04/21 07/03/21 Yes Kairen Hallinan, Lennette Bihari, MD  losartan (COZAAR) 100 MG tablet Take 1 tablet once daily for blood pressure. 03/29/21  Yes Pleas Koch, NP  metoprolol succinate (TOPROL-XL) 50 MG 24 hr tablet Take 1 tablet (50 mg total) by mouth daily. For heart rate/palpitations. 08/12/20  Yes Pleas Koch, NP  ondansetron (ZOFRAN) 4 MG tablet Take 1 tablet (4 mg total) by mouth every 6 (six) hours as needed for nausea. 01/28/21  Yes Wieting, Richard, MD  estradiol (ESTRACE) 0.1 MG/GM vaginal cream Estrogen Cream Instruction Discard applicator Apply pea  sized amount to tip of finger to urethra before bed. Wash hands well after application. Use Monday, Wednesday and Friday Patient not taking: Reported on 04/04/2021 12/16/20   Billey Co, MD  omeprazole (PRILOSEC) 40 MG capsule Take 1 capsule (40 mg total) by mouth 2 (two) times daily for 28 days. For heartburn. 04/04/21 05/02/21  Virgel Manifold, MD    Family History  Problem Relation Age of Onset   Hypertension Father    Prostate cancer Father    Hypertension Sister    Lung cancer Sister    Diabetes Brother    Heart disease Brother    Hypertension Brother    Diabetes Brother    Heart disease Brother    Breast cancer Neg Hx      Social History   Tobacco Use   Smoking status: Never   Smokeless tobacco: Never  Vaping Use   Vaping Use: Never used  Substance Use Topics   Alcohol use: No   Drug use: Never    Allergies as of  04/04/2021   (No Known Allergies)    Review of Systems:    All systems reviewed and negative except where noted in HPI.   Physical Exam:  Constitutional: General:   Alert,  Well-developed, well-nourished, pleasant and cooperative in NAD BP (!) 151/80   Pulse 70   Temp 97.7 F (36.5 C) (Oral)   Ht 5\' 5"  (1.651 m)   Wt 147 lb 9.6 oz (67 kg)   BMI 24.56 kg/m   Eyes:  Sclera clear, no icterus.   Conjunctiva pink. PERRLA  Ears:  No scars, lesions or masses, Normal auditory acuity. Nose:  No deformity, discharge, or lesions. Mouth:  No deformity or lesions, oropharynx pink & moist.  Neck:  Supple; no masses or thyromegaly.  Respiratory: Normal respiratory effort, Normal percussion  Gastrointestinal: Soft, non-tender and non-distended without masses, hepatosplenomegaly or hernias noted.  No guarding or rebound tenderness.     Cardiac: No clubbing or edema.  No cyanosis. Normal posterior tibial pedal pulses noted.  Lymphatic:  No significant cervical or axillary adenopathy.  Psych:  Alert and cooperative. Normal mood and affect.  Musculoskeletal:  Normal gait. Head normocephalic, atraumatic. Symmetrical without gross deformities. 5/5 Upper and Lower extremity strength bilaterally.  Skin: Warm. Intact without significant lesions or rashes. No jaundice.  Neurologic:  Face symmetrical, tongue midline, Normal sensation to touch;  grossly normal neurologically.  Psych:  Alert and oriented x3, Alert and cooperative. Normal mood and affect.   Labs: CBC    Component Value Date/Time   WBC 5.4 02/15/2021 1149   RBC 3.90 02/15/2021 1149   HGB 13.0 02/15/2021 1149   HGB 13.3 07/31/2013 1727   HCT 37.7 02/15/2021 1149   HCT 38.1 07/31/2013 1727   PLT 234 02/15/2021 1149   PLT 225 07/31/2013 1727   MCV 96.7 02/15/2021 1149   MCV 95 07/31/2013 1727   MCH 33.3 02/15/2021 1149   MCHC 34.5 02/15/2021 1149   RDW 14.0 02/15/2021 1149   RDW 14.0 07/31/2013 1727   LYMPHSABS 2.0  12/08/2020 1040   MONOABS 0.4 12/08/2020 1040   EOSABS 0.1 12/08/2020 1040   BASOSABS 0.0 12/08/2020 1040   CMP     Component Value Date/Time   NA 141 02/15/2021 1149   NA 138 07/31/2013 1727   K 3.7 02/15/2021 1149   K 3.7 07/31/2013 1727   CL 106 02/15/2021 1149   CL 105 07/31/2013 1727   CO2 28 02/15/2021 1149  CO2 31 07/31/2013 1727   GLUCOSE 115 (H) 02/15/2021 1149   GLUCOSE 141 (H) 07/31/2013 1727   BUN 12 02/15/2021 1149   BUN 15 07/31/2013 1727   CREATININE 0.75 02/15/2021 1149   CREATININE 0.96 07/31/2013 1727   CALCIUM 9.5 02/15/2021 1149   CALCIUM 9.0 07/31/2013 1727   PROT 6.3 (L) 01/27/2021 0559   ALBUMIN 3.3 (L) 01/27/2021 0559   AST 29 01/27/2021 0559   ALT 16 01/27/2021 0559   ALKPHOS 40 01/27/2021 0559   BILITOT 2.1 (H) 01/27/2021 0559   GFRNONAA >60 02/15/2021 1149   GFRNONAA >60 07/31/2013 1727   GFRAA >60 02/03/2020 0956   GFRAA >60 07/31/2013 1727    Imaging Studies: CT abdomen pelvis September 2022  IMPRESSION: 1. Evidence of prior cholecystectomy. 2. No evidence of an acute process within the abdomen or pelvis. 3. Aortic atherosclerosis    Assessment and Plan:   TANNIE KOSKELA is a 71 y.o. y/o female has been referred for epigastric pain and constipation  Patient has had extensive work-up in the past, including EGD, manometry, pH study, as noted in Dr. Norvel Richards notes (see HPI), and her epigastric pain may be due to functional heartburn symptoms  Unclear if desipramine helped her before  However, she is reporting breakthrough heartburn symptoms on once daily PPI.  Increase to twice daily PPI for 30 days and reassess symptoms  If symptoms do not improve in 3 to 4 weeks patient advised to call us and she verbalized understanding  She also has underlying constipation and she is not on any daily medications for this.  Will start Linzess at this time and if constipation does not improve within 2 to 3 weeks, patient advised to call  us and she verbalized understanding as well  Continue follow-up in clinic  Check H. pylori breath test due to epigastric pain Total bilirubin elevated with normal alkaline phosphatase.  No evidence of biliary obstruction on CT scan.  Prior history of cholecystectomy.  Check hepatic function panel as it would rule out for bilirubin fractionation as well  If symptoms do not improve can consider upper endoscopy given that last procedure was 5 years ago  (Risks of PPI use were discussed with patient including bone loss, C. Diff diarrhea, pneumonia, infections, CKD, electrolyte abnormalities.  Pt. Verbalizes understanding and chooses to continue the medication.)   Dr Valerie Antigua  Speech recognition software was used to dictate the above note.

## 2021-04-06 LAB — HEPATIC FUNCTION PANEL
ALT: 17 IU/L (ref 0–32)
AST: 22 IU/L (ref 0–40)
Albumin: 4.3 g/dL (ref 3.8–4.8)
Alkaline Phosphatase: 60 IU/L (ref 44–121)
Bilirubin Total: 0.7 mg/dL (ref 0.0–1.2)
Bilirubin, Direct: 0.19 mg/dL (ref 0.00–0.40)
Total Protein: 7.1 g/dL (ref 6.0–8.5)

## 2021-04-06 LAB — H PYLORI, IGM, IGG, IGA AB
H pylori, IgM Abs: <9 units (ref 0.0–8.9)
H. pylori, IgA Abs: <9 units (ref 0.0–8.9)
H. pylori, IgG AbS: 0.19 Index Value (ref 0.00–0.79)

## 2021-04-07 ENCOUNTER — Telehealth: Payer: Self-pay | Admitting: Primary Care

## 2021-04-07 NOTE — Chronic Care Management (AMB) (Signed)
  Chronic Care Management   Outreach Note  04/07/2021 Name: Valerie [name] MRN: [id number] DOB: 13-Feb-1951  Referred by: Pleas Koch, NP Reason for referral : No chief complaint on file.   An unsuccessful telephone outreach was attempted today. The patient was referred to the pharmacist for assistance with care management and care coordination.   Follow Up Plan:   Tatjana Dellinger Upstream Scheduler

## 2021-04-07 NOTE — Chronic Care Management (AMB) (Signed)
  Chronic Care Management   Note  04/07/2021 Name: ROSEMARIE GALVIS MRN: [id number] DOB: 1950-06-24  JAYA LAPKA is a 70 y.o. year old female who is a primary care patient of Pleas Koch, NP. I reached out to [name] by phone today in response to a referral sent by Ms. Patrick [place]'s PCP, Pleas Koch, NP.   Ms. Wain was given information about Chronic Care Management services today including:  CCM service includes personalized support from designated clinical staff supervised by her physician, including individualized plan of care and coordination with other care providers 24/7 contact phone numbers for assistance for urgent and routine care needs. Service will only be billed when office clinical staff spend 20 minutes or more in a month to coordinate care. Only one practitioner may furnish and bill the service in a calendar month. The patient may stop CCM services at any time (effective at the end of the month) by phone call to the office staff.   Patient agreed to services and verbal consent obtained.   Follow up plan:   Tatjana Secretary/administrator

## 2021-04-08 ENCOUNTER — Encounter: Payer: Self-pay | Admitting: Primary Care

## 2021-04-08 ENCOUNTER — Ambulatory Visit (INDEPENDENT_AMBULATORY_CARE_PROVIDER_SITE_OTHER): Payer: Medicare HMO | Admitting: Primary Care

## 2021-04-08 ENCOUNTER — Other Ambulatory Visit: Payer: Self-pay

## 2021-04-08 DIAGNOSIS — I10 Essential (primary) hypertension: Secondary | ICD-10-CM | POA: Diagnosis not present

## 2021-04-08 LAB — BASIC METABOLIC PANEL
BUN: 19 mg/dL (ref 6–23)
CO2: 29 mEq/L (ref 19–32)
Calcium: 9.4 mg/dL (ref 8.4–10.5)
Chloride: 103 mEq/L (ref 96–112)
Creatinine, Ser: 0.83 mg/dL (ref 0.40–1.20)
GFR: 72.34 mL/min (ref >60.00)
Glucose, Bld: 98 mg/dL (ref 70–99)
Potassium: 3.8 mEq/L (ref 3.5–5.1)
Sodium: 138 mEq/L (ref 135–145)

## 2021-04-08 MED ORDER — AMLODIPINE BESYLATE 5 MG PO TABS: 5.0000 mg | ORAL_TABLET | Freq: Every day | ORAL | 0 refills | Status: DC

## 2021-04-08 NOTE — Patient Instructions (Signed)
Do not take more than 1 tablet of losartan once daily for blood pressure.  We increased your amlodipine to 5 mg. Take this once daily for blood pressure.  Stop by the lab prior to leaving today. I will notify you of your results once received.   It was a pleasure to see you today!

## 2021-04-08 NOTE — Assessment & Plan Note (Signed)
Stable today, home readings are quite high, also with symptoms of headaches for which wake her from sleep.  Will trial an increase of amlodipine to 5 mg. Discussed to refrain from taking more than 100 mg of losartan daily.   Will check BMP today given recurrent use of excessive levels of losartan. We will plan to see her back in 2 weeks for BP check.   BMP pending. Consider imaging of the brain if headaches persist despite BP reduction.

## 2021-04-08 NOTE — Progress Notes (Signed)
Subjective:    Patient ID: Valerie [name], female    DOB: 06/18/1950, 70 y.o.   MRN: [id number]  HPI  Valerie [name] is a very pleasant 70 y.o. female with a history of hypertension, chronic constipation, partial bowel obstruction, endometrial cancer, cystitis, hyperlipidemia who presents today to discuss hypertension.  Currently managed on losartan 100 mg and metoprolol succinate 50 mg. She is also managed on amlodipine 2.5 mg but this is not on our medication list.   She is checking her BP at home which is running 140-190's/90's-100's. She is waking up with headaches nightly which are mostly located to the bilateral frontal and occipital lobes. She will check her BP during these episodes and BP will be "high".   She's increased her losartan to 1.5 tablets daily for the last 2 months and will sometimes take 2 tablets daily. She's noticed during the day that she doesn't have headaches unless her BP is elevated.   Evaluated by cardiology a few weeks ago, did not mention that she was taking excessive amounts of losartan. BP was 132/90 so she was advised to work on low salt diet and physical activity.   BP Readings from Last 3 Encounters:  04/08/21 137/84  04/04/21 (!) 151/80  02/23/21 140/82      Review of Systems  Eyes:  Negative for visual disturbance.  Respiratory:  Negative for shortness of breath.   Cardiovascular:  Negative for chest pain.  Neurological:  Positive for headaches. Negative for dizziness.        Past Medical History:  Diagnosis Date   Acid reflux    Adenocarcinoma of the endometrium/uterus ([place]) 12/04/2000   Anal fissure    Atrophic vaginitis    Difficult intubation    limited neck mobility   Esophagitis    Hernia    Present to proximal to umbillical    History of hiatal hernia    Hyperlipidemia    Hypertension    Internal hemorrhoids    Menorrhagia    Osteopenia    PONV (postoperative nausea and vomiting)    Vertigo    Vitamin D deficiency      Social History   Socioeconomic History   Marital status: Married    Spouse name: Not on file   Number of children: Not on file   Years of education: Not on file   Highest education level: Not on file  Occupational History   Not on file  Tobacco Use   Smoking status: Never   Smokeless tobacco: Never  Vaping Use   Vaping Use: Never used  Substance and Sexual Activity   Alcohol use: No   Drug use: Never   Sexual activity: Yes    Birth control/protection: Post-menopausal, Surgical  Other Topics Concern   Not on file  Social History Narrative   Married   Lives in [place]   Has 2 children, 4 grandchildren   Enjoys walking, shopping.   Social Determinants of Health   Financial Resource Strain: Not on file  Food Insecurity: Not on file  Transportation Needs: Not on file  Physical Activity: Not on file  Stress: Not on file  Social Connections: Not on file  Intimate Partner Violence: Not on file    Past Surgical History:  Procedure Laterality Date   36 HOUR [place] STUDY N/A 11/29/2015   Procedure: [place];  Surgeon: Manus Gunning, MD;  Location: [facility];  Service: Gastroenterology;  Laterality: N/A;   ABDOMINAL HYSTERECTOMY  12/04/2000  also bilateral salpingo-oophorectomy   CARDIAC CATHETERIZATION  2006   CARPAL TUNNEL RELEASE  11/20/2011   Procedure: CARPAL TUNNEL RELEASE;  Surgeon: Elaina Hoops, MD;  Location: [facility];  Service: Neurosurgery;  Laterality: Left;  LEFT carpal tunnel release   CERVICAL SPINE SURGERY  2013   [place]   COLONOSCOPY WITH PROPOFOL N/A 07/27/2020   Procedure: COLONOSCOPY WITH PROPOFOL;  Surgeon: Lucilla Lame, MD;  Location: [facility] ENDOSCOPY;  Service: Endoscopy;  Laterality: N/A;   DILATION AND CURETTAGE OF UTERUS     11/15/2000   ESOPHAGEAL MANOMETRY N/A 09/20/2015   Procedure: ESOPHAGEAL MANOMETRY (EM);  Surgeon: Manus Gunning, MD;  Location: [facility];  Service: Gastroenterology;   Laterality: N/A;   HYSTEROSCOPY  11/15/2000   D & C, resection of endometrial polyps   OOPHORECTOMY  12/04/2000   bilateral salpingo-oophorectomy done with TAH   SPINAL CORD STIMULATOR INSERTION Bilateral 04/12/2020   Procedure: INSERTION CERVICAL SPINAL STIMULATOR PULSE GENERATOR;  Surgeon: [name], MD;  Location: [facility] ORS;  Service: Neurosurgery;  Laterality: Bilateral;   SPINAL CORD STIMULATOR TRIAL N/A 03/22/2020   Procedure: CERVICAL SPINAL CORD STIMULATOR TRIAL PERCUTANEOUS;  Surgeon: [name], MD;  Location: [facility] ORS;  Service: Neurosurgery;  Laterality: N/A;   TUBAL LIGATION      Family History  Problem Relation Age of Onset   Hypertension Father    Prostate cancer Father    Hypertension Sister    Lung cancer Sister    Diabetes Brother    Heart disease Brother    Hypertension Brother    Diabetes Brother    Heart disease Brother    Breast cancer Neg Hx     No Known Allergies  Current Outpatient Medications on File Prior to Visit  Medication Sig Dispense Refill   acetaminophen (TYLENOL) 500 MG tablet Take 500-1,000 mg by mouth every 6 (six) hours as needed (for pain).     atorvastatin (LIPITOR) 20 MG tablet Take 20 mg by mouth daily.     DULoxetine (CYMBALTA) 20 MG capsule Take 1 capsule (20 mg total) by mouth daily. For anxiety/depression/pain 90 capsule 3   estradiol (ESTRACE) 0.1 MG/GM vaginal cream Estrogen Cream Instruction Discard applicator Apply pea sized amount to tip of finger to urethra before bed. Wash hands well after application. Use Monday, Wednesday and Friday 42.5 g 3   linaclotide (LINZESS) 145 MCG CAPS capsule Take 1 capsule (145 mcg total) by mouth daily. 30 capsule 2   losartan (COZAAR) 100 MG tablet Take 1 tablet once daily for blood pressure. 90 tablet 1   metoprolol succinate (TOPROL-XL) 50 MG 24 hr tablet Take 1 tablet (50 mg total) by mouth daily. For heart rate/palpitations. 90 tablet 3   omeprazole (PRILOSEC) 40 MG capsule Take 1 capsule  (40 mg total) by mouth 2 (two) times daily for 28 days. For heartburn. 56 capsule 0   ondansetron (ZOFRAN) 4 MG tablet Take 1 tablet (4 mg total) by mouth every 6 (six) hours as needed for nausea. (Patient not taking: Reported on 04/08/2021) 20 tablet 0   No current facility-administered medications on file prior to visit.    BP 137/84   Pulse 74   Temp (!) 97 F (36.1 C) (Temporal)   Ht 5\' 5"  (1.651 m)   Wt 147 lb (66.7 kg)   SpO2 96%   BMI 24.46 kg/m  Objective:   Physical Exam Cardiovascular:     Rate and Rhythm: Normal rate and regular  rhythm.  Pulmonary:     Effort: Pulmonary effort is normal.     Breath sounds: Normal breath sounds.  Musculoskeletal:     Cervical back: Neck supple.  Skin:    General: Skin is warm and dry.          Assessment & Plan:      This visit occurred during the SARS-CoV-2 public health emergency.  Safety protocols were in place, including screening questions prior to the visit, additional usage of staff PPE, and extensive cleaning of exam room while observing appropriate contact time as indicated for disinfecting solutions.

## 2021-04-11 DIAGNOSIS — I10 Essential (primary) hypertension: Secondary | ICD-10-CM | POA: Diagnosis not present

## 2021-04-11 DIAGNOSIS — I700 Atherosclerosis of aorta: Secondary | ICD-10-CM | POA: Diagnosis not present

## 2021-04-11 DIAGNOSIS — E782 Mixed hyperlipidemia: Secondary | ICD-10-CM | POA: Diagnosis not present

## 2021-04-19 ENCOUNTER — Telehealth: Payer: Self-pay

## 2021-04-19 NOTE — Chronic Care Management (AMB) (Signed)
Chronic Care Management Pharmacy Assistant   Name: Valerie [name]  MRN: [id number] DOB: 12-04-1950  Valerie [name] is an 70 y.o. year old female who presents for his initial CCM visit with the clinical pharmacist.  Reason for Encounter: Initial Questions   Conditions to be addressed/monitored: HTN and HLD   Recent office visits:  04/08/21-PCP-Katherine Clark,NP-Patient presented for follow up hypertension.Take only 1 tablet of losartan 100mg  daily, increase amlodipine to 5mg  once daily.Labs ordered (Kidneys and electrolytes look good) consider imaging of the brain if headaches persist. 02/23/21-PCP-Katherine Clark,NP-Patient presented for follow up from hospital visit for epigastric and abdominal pain. Work on more hydration with water.  Recent consult visits:  04/04/21-Gastroenterology-Varnita Tahiliani,MD-Patient presented for initial visit-Reviewed CT-abdomen/pelvis. Labs ordered (normal) Start Linzess,consider upper endoscopy. 03/24/21-Cardiology-Amanda Tobin,PA-Patient presented for hypertension and tachycardia. No symptoms at this time, EKG,normal,follow up 9 months 02/21/21-General Surgery-Diego Pabon,MD- Patient presented for initial visit.No surgical intervention required.Discussed taking fiber supplement, metamucil ,benefiber daily with plenty of fluids and water. Follow up as needed 02/08/21-Urology-Brian Sninsky,MD-Patient presented for cystoscopy procedure. 12/16/20-Uology-Brian Sninsky,MD-Patient presented for recurrent UTI.Trial of cranberry tablets and topical estrogen cream,follow up 6 months 12/15/20-Cardiology-Bruce Kowalski,MD-Patient presented for follow up.Continue to monitor palpitations,ECG ordered, increase atorvastatin to 20mg  take 1 tablet daily,Amlodipine 5mg  take 1 tablet daily  12/08/20-PCP-Katherine Clark,NP-Patient presented for follow up cystitis-Today she endorses that she was called a few days after her ED visit and was told to stop Keflex and to start cefdinir 300  mg. She's been taking this BID as directed, will finish in a few days.Stop taking cefdinir antibiotics. Start Bactrim DS (sulfamethoxazole/trimethoprim) tablets for urinary tract infection. Take 1 tablet by mouth twice daily for 3 days.Referral to  GI,labs ordered. UA today with 3+ blood, negative for leuks and nitrites. Culture sent.   Hospital visits:  02/15/21-[facility] ED-Kelly Rose McHugh,MD-Patient presented for palpitations.Labs ordered (some abnormal) xray,EKG,Patient stable for discharge 01/25/21 thru 01/28/21-[facility]-Patient presented for epigastric and right side abdominal pain.Labs,xrays,EKG-The patient was given 1 L bolus of IV lactated Ringer followed by 75 mill per hour, 4 mg of IV morphine sulfate and 4 mg IV Zofran twice.  She will be admitted to a medical bed for further evaluation and management..General surgery consult.Admission  11/30/20-[facility]-Zachary Smith,MD-Patient presented for epigastric and abdominal pain,xrays,labs ordered,EKG,Will write short course of Keflex.  Patient also given Carafate and Maalox in the ED and felt little better on reassessment. No admission.  Medications: Outpatient Encounter Medications as of 04/19/2021  Medication Sig   acetaminophen (TYLENOL) 500 MG tablet Take 500-1,000 mg by mouth every 6 (six) hours as needed (for pain).   amLODipine (NORVASC) 5 MG tablet Take 1 tablet (5 mg total) by mouth daily. For blood pressure.   atorvastatin (LIPITOR) 20 MG tablet Take 20 mg by mouth daily.   DULoxetine (CYMBALTA) 20 MG capsule Take 1 capsule (20 mg total) by mouth daily. For anxiety/depression/pain   estradiol (ESTRACE) 0.1 MG/GM vaginal cream Estrogen Cream Instruction Discard applicator Apply pea sized amount to tip of finger to urethra before bed. Wash hands well after application. Use Monday, Wednesday and Friday   linaclotide (LINZESS) 145 MCG CAPS capsule Take 1 capsule (145 mcg total) by mouth daily.   losartan (COZAAR) 100 MG tablet Take 1 tablet once  daily for blood pressure.   metoprolol succinate (TOPROL-XL) 50 MG 24 hr tablet Take 1 tablet (50 mg total) by mouth daily. For heart rate/palpitations.   omeprazole (PRILOSEC) 40 MG capsule Take 1 capsule (40 mg total) by mouth  2 (two) times daily for 28 days. For heartburn.   ondansetron (ZOFRAN) 4 MG tablet Take 1 tablet (4 mg total) by mouth every 6 (six) hours as needed for nausea. (Patient not taking: Reported on 04/08/2021)   No facility-administered encounter medications on file as of 04/19/2021.     Lab Results  Component Value Date/Time   HGBA1C 5.5 06/29/2020 11:00 AM   HGBA1C 5.4 02/10/2019 09:01 AM     BP Readings from Last 3 Encounters:  04/08/21 137/84  04/04/21 (!) 151/80  02/23/21 140/82    Patient contacted to review initial questions prior to visit with [name].  Have you seen any other providers since your last visit with PCP? No  Any changes in your medications or health? Yes  The patient states her headaches have improved.  Any side effects from any medications? No  Do you have an symptoms or problems not managed by your medications? No  Any concerns about your health right now? No  Has your provider asked that you check blood pressure, blood sugar, or follow special diet at home? Yes The patient reports she checks her BP on occasion and she is on low salt diet.  Do you get any type of exercise on a regular basis? Yes The patient wants to increase her exercise program  Can you think of a goal you would like to reach for your health? The patient wants to be able to exercise more.  Do you have any problems getting your medications? No The patient uses [employer] mail order for most of her maintenance medications.The new medication of Linzess is expensive for the patient    Is there anything that you would like to discuss during the appointment? No   Spoke with patient and reminded them to have all medications, supplements and any blood glucose  and blood pressure readings available for review with pharmacist, at their telephone visit on 04/26/21 at 2:00pm.   Star Rating Drugs:  Medication:  Last Fill: Day Supply Losartan 100mg  02/25/21 90 Atorvastatin 20mg  03/11/21 90    Care Gaps: Annual wellness visit in last year? Yes Most Recent BP reading:134/84  74-P  04/08/21   Marjo Bicker, CPP notified  Avel Sensor, [place] Assistant [phone]  Total time spent for month CPA: 50 min

## 2021-04-25 ENCOUNTER — Ambulatory Visit: Payer: Medicare HMO | Admitting: Physician Assistant

## 2021-04-25 ENCOUNTER — Encounter: Payer: Self-pay | Admitting: Physician Assistant

## 2021-04-25 ENCOUNTER — Other Ambulatory Visit: Payer: Self-pay

## 2021-04-25 VITALS — BP 151/91 | HR 80 | Ht 65.0 in | Wt 146.0 lb

## 2021-04-25 DIAGNOSIS — R3129 Other microscopic hematuria: Secondary | ICD-10-CM

## 2021-04-25 LAB — URINALYSIS, COMPLETE
Bilirubin, UA: NEGATIVE
Glucose, UA: NEGATIVE
Nitrite, UA: NEGATIVE
Protein,UA: NEGATIVE
Specific Gravity, UA: 1.02 (ref 1.005–1.030)
Urobilinogen, Ur: 0.2 mg/dL (ref 0.2–1.0)
pH, UA: 6 (ref 5.0–7.5)

## 2021-04-25 LAB — MICROSCOPIC EXAMINATION: WBC, UA: >30 /hpf — AB (ref 0–5)

## 2021-04-25 NOTE — Progress Notes (Signed)
04/25/2021 9:26 AM   Valerie [name] 09/29/1950 [id number]  CC: Chief Complaint  Patient presents with   Follow-up    HPI: Valerie [name] is a 70 y.o. female with PMH recurrent UTI associated with microscopic hematuria with benign cystoscopy and CTAP with contrast in 2022 on cranberry supplements and topical vaginal estrogen cream who presents today for 72-month follow-up.   Urine cytology from her last visit with Dr. Diamantina Providence was negative for high-grade urothelial carcinoma.  Today she reports no bothersome urinary symptoms today.  Since she was last seen in our clinic, she was hospitalized with a bowel obstruction and is now on Linzess.  Her bowels tend to fluctuate between diarrhea and constipation.  She has some nausea nearly daily.  She continues estrogen cream twice weekly and cranberry supplements.  In-office UA today positive for trace ketones, 3+ blood, and 3+ leukocyte esterase; urine microscopy with >30 WBCs/HPF and many bacteria.   PMH: Past Medical History:  Diagnosis Date   Acid reflux    Adenocarcinoma of the endometrium/uterus ([place]) 12/04/2000   Anal fissure    Atrophic vaginitis    Difficult intubation    limited neck mobility   Esophagitis    Hernia    Present to proximal to umbillical    History of hiatal hernia    Hyperlipidemia    Hypertension    Internal hemorrhoids    Menorrhagia    Osteopenia    PONV (postoperative nausea and vomiting)    Vertigo    Vitamin D deficiency     Surgical History: Past Surgical History:  Procedure Laterality Date   14 HOUR [place] STUDY N/A 11/29/2015   Procedure: 24 HOUR PH STUDY;  Surgeon: Manus Gunning, MD;  Location: [facility];  Service: Gastroenterology;  Laterality: N/A;   ABDOMINAL HYSTERECTOMY  12/04/2000   also bilateral salpingo-oophorectomy   CARDIAC CATHETERIZATION  2006   CARPAL TUNNEL RELEASE  11/20/2011   Procedure: CARPAL TUNNEL RELEASE;  Surgeon: Elaina Hoops, MD;  Location: [place] NEURO ORS;  Service:  Neurosurgery;  Laterality: Left;  LEFT carpal tunnel release   CERVICAL SPINE SURGERY  2013   [place]   COLONOSCOPY WITH PROPOFOL N/A 07/27/2020   Procedure: COLONOSCOPY WITH PROPOFOL;  Surgeon: Lucilla Lame, MD;  Location: [facility] ENDOSCOPY;  Service: Endoscopy;  Laterality: N/A;   DILATION AND CURETTAGE OF UTERUS     11/15/2000   ESOPHAGEAL MANOMETRY N/A 09/20/2015   Procedure: ESOPHAGEAL MANOMETRY (EM);  Surgeon: Manus Gunning, MD;  Location: [facility] ENDOSCOPY;  Service: Gastroenterology;  Laterality: N/A;   HYSTEROSCOPY  11/15/2000   D & C, resection of endometrial polyps   OOPHORECTOMY  12/04/2000   bilateral salpingo-oophorectomy done with TAH   SPINAL CORD STIMULATOR INSERTION Bilateral 04/12/2020   Procedure: INSERTION CERVICAL SPINAL STIMULATOR PULSE GENERATOR;  Surgeon: [name], MD;  Location: [facility] ORS;  Service: Neurosurgery;  Laterality: Bilateral;   SPINAL CORD STIMULATOR TRIAL N/A 03/22/2020   Procedure: CERVICAL SPINAL CORD STIMULATOR TRIAL PERCUTANEOUS;  Surgeon: [name], MD;  Location: [facility] ORS;  Service: Neurosurgery;  Laterality: N/A;   TUBAL LIGATION      Home Medications:  Allergies as of 04/25/2021   No Known Allergies      Medication List        Accurate as of April 25, 2021  9:26 AM. If you have any questions, ask your nurse or doctor.          acetaminophen 500 MG  tablet Commonly known as: TYLENOL Take 500-1,000 mg by mouth every 6 (six) hours as needed (for pain).   amLODipine 5 MG tablet Commonly known as: NORVASC Take 1 tablet (5 mg total) by mouth daily. For blood pressure.   atorvastatin 20 MG tablet Commonly known as: LIPITOR Take 20 mg by mouth daily.   DULoxetine 20 MG capsule Commonly known as: Cymbalta Take 1 capsule (20 mg total) by mouth daily. For anxiety/depression/pain   estradiol 0.1 MG/GM vaginal cream Commonly known as: ESTRACE Estrogen Cream Instruction Discard applicator Apply pea sized  amount to tip of finger to urethra before bed. Wash hands well after application. Use Monday, Wednesday and Friday   linaclotide 145 MCG Caps capsule Commonly known as: Linzess Take 1 capsule (145 mcg total) by mouth daily.   losartan 100 MG tablet Commonly known as: COZAAR Take 1 tablet once daily for blood pressure.   metoprolol succinate 50 MG 24 hr tablet Commonly known as: TOPROL-XL Take 1 tablet (50 mg total) by mouth daily. For heart rate/palpitations.   omeprazole 40 MG capsule Commonly known as: PRILOSEC Take 1 capsule (40 mg total) by mouth 2 (two) times daily for 28 days. For heartburn.   ondansetron 4 MG tablet Commonly known as: ZOFRAN Take 1 tablet (4 mg total) by mouth every 6 (six) hours as needed for nausea.        Allergies:  No Known Allergies  Family History: Family History  Problem Relation Age of Onset   Hypertension Father    Prostate cancer Father    Hypertension Sister    Lung cancer Sister    Diabetes Brother    Heart disease Brother    Hypertension Brother    Diabetes Brother    Heart disease Brother    Breast cancer Neg Hx     Social History:   reports that she has never smoked. She has never used smokeless tobacco. She reports that she does not drink alcohol and does not use drugs.  Physical Exam: BP (!) 151/91   Pulse 80   Ht 5\' 5"  (1.651 m)   Wt 146 lb (66.2 kg)   BMI 24.30 kg/m   Constitutional:  Alert and oriented, no acute distress, nontoxic appearing HEENT: [address], AT Cardiovascular: No clubbing, cyanosis, or edema Respiratory: Normal respiratory effort, no increased work of breathing Skin: No rashes, bruises or suspicious lesions Neurologic: Grossly intact, no focal deficits, moving all 4 extremities Psychiatric: Normal mood and affect  Laboratory Data: Results for orders placed or performed in visit on 04/25/21  Microscopic Examination   Urine  Result Value Ref Range   WBC, UA >30 (A) 0 - 5 /hpf   RBC 0-2 0 - 2 /hpf    Epithelial Cells (non renal) 0-10 0 - 10 /hpf   Mucus, UA Present (A) Not Estab.   Bacteria, UA Many (A) None seen/Few  Urinalysis, Complete  Result Value Ref Range   Specific Gravity, UA 1.020 1.005 - 1.030   pH, UA 6.0 5.0 - 7.5   Color, UA Yellow Yellow   Appearance Ur Cloudy (A) Clear   Leukocytes,UA 3+ (A) Negative   Protein,UA Negative Negative/Trace   Glucose, UA Negative Negative   Ketones, UA Trace (A) Negative   RBC, UA 3+ (A) Negative   Bilirubin, UA Negative Negative   Urobilinogen, Ur 0.2 0.2 - 1.0 mg/dL   Nitrite, UA Negative Negative   Microscopic Examination See below:    Assessment & Plan:   1. Microhematuria  Resolved today with no acute infective symptoms.  Her UA is notable for pyuria and bacteriuria, though no indication to treat in the absence of infective symptoms.  Will send for culture in case she develops these, but defer therapy in the absence.  Encourage patient to continue cranberry supplements and estrogen cream 3 times weekly, sooner if she develops gross hematuria or dysuria. - Urinalysis, Complete - CULTURE, URINE COMPREHENSIVE  Return in about 1 year (around 04/25/2022) for Annual MH, rUTI f/u with UA.  Debroah Loop, PA-C  [facility] [address] [place], [place] [address] [phone]

## 2021-04-25 NOTE — Patient Instructions (Addendum)
Continue taking an over-the-counter cranberry supplement for urinary tract health. Take this once or twice daily on an empty stomach, e.g. right before bed. Continue vaginal estrogen cream. Apply a pea-sized amount around the urethra three times weekly.  I'll plan to see you back in 1 year. Please come see me sooner for UTI symptoms or if you start seeing blood in your urine.

## 2021-04-26 ENCOUNTER — Ambulatory Visit (INDEPENDENT_AMBULATORY_CARE_PROVIDER_SITE_OTHER): Payer: Medicare HMO | Admitting: Pharmacist

## 2021-04-26 DIAGNOSIS — K219 Gastro-esophageal reflux disease without esophagitis: Secondary | ICD-10-CM

## 2021-04-26 DIAGNOSIS — K5909 Other constipation: Secondary | ICD-10-CM

## 2021-04-26 DIAGNOSIS — F411 Generalized anxiety disorder: Secondary | ICD-10-CM

## 2021-04-26 DIAGNOSIS — I10 Essential (primary) hypertension: Secondary | ICD-10-CM

## 2021-04-26 DIAGNOSIS — M8580 Other specified disorders of bone density and structure, unspecified site: Secondary | ICD-10-CM

## 2021-04-26 DIAGNOSIS — E785 Hyperlipidemia, unspecified: Secondary | ICD-10-CM

## 2021-04-26 NOTE — Patient Instructions (Signed)
Visit Information  Phone number for Pharmacist: [phone]  Thank you for meeting with me to discuss your medications! I look forward to working with you to achieve your health care goals. Below is a summary of what we talked about during the visit:   Goals Addressed             This Visit's Progress    Track and Manage My Blood Pressure-Hypertension       Timeframe:  Long-Range Goal Priority:  High Start Date:         04/26/21                    Expected End Date:   04/26/22                    Follow Up Date Feb 2023   - check blood pressure daily - choose a place to take my blood pressure (home, clinic or office, retail store) - write blood pressure results in a log or diary    Why is this important?   You won't feel high blood pressure, but it can still hurt your blood vessels.  High blood pressure can cause heart or kidney problems. It can also cause a stroke.  Making lifestyle changes like losing a little weight or eating less salt will help.  Checking your blood pressure at home and at different times of the day can help to control blood pressure.  If the doctor prescribes medicine remember to take it the way the doctor ordered.  Call the office if you cannot afford the medicine or if there are questions about it.     Notes:         Care Plan : [place]  Updates made by Charlton Haws, [place] since 04/26/2021 12:00 AM     Problem: Hypertension, Hyperlipidemia, Depression, Anxiety, Osteopenia, and Chronic constipation   Priority: High     Long-Range Goal: Disease mgmt   Start Date: 04/26/2021  Expected End Date: 04/26/2022  This Visit's Progress: On track  Priority: High  Note:   Current Barriers:  Unable to independently monitor therapeutic efficacy  Pharmacist Clinical Goal(s):  Patient will achieve adherence to monitoring guidelines and medication adherence to achieve therapeutic efficacy through collaboration with PharmD and  provider.   Interventions: 1:1 collaboration with Pleas Koch, NP regarding development and update of comprehensive plan of care as evidenced by provider attestation and co-signature Inter-disciplinary care team collaboration (see longitudinal plan of care) Comprehensive medication review performed; medication list updated in electronic medical record  Hypertension (BP goal <130/80) -Controlled - pt-reported BP at home is at goal; pt denies headaches, dizziness, lightheadedness; she reports cardiologist recently changed losartan to valsartan and she endorses compliance with this change; white coat syndrome may explain recent high BP in office; she has recent hx of palpitations -Current home readings: 117/60 -Current treatment: Amlodipine 5 mg daily Metoprolol succinate 50 mg daily Valsartan 160 mg daily -Medications previously tried: losartan  -Educated on BP goals and benefits of medications for prevention of heart attack, stroke and kidney damage; Daily salt intake goal < 2300 mg; Exercise goal of 150 minutes per week; Importance of home blood pressure monitoring; -Counseled to monitor BP at home daily, document, and provide log at future appointments; advised to bring BP monitor to office visit with PCP to callibrate -Recommended to continue current medication  Hyperlipidemia: (LDL goal < 70) -Not ideally controlled - LDL is above goal,  statin was increased July 2022, no repeat lipids yet; pt endorses compliance with statin -Current treatment: Atorvastatin 20 mg daily -Educated on Cholesterol goals; Benefits of statin for ASCVD risk reduction; -Recommended to continue current medication  Depression/Anxiety (Goal: manage symptoms) -Not ideally controlled - pt is not taking duloxetine except for 1-2 times per month as needed; she reports the medication does not usually help anxiety when she takes it this way -Current treatment: Duloxetine 20 mg daily -PHQ9: 12 (06/2020) -GAD7:  14 (06/2020) -Connected with PCP for mental health support -Educated on daily dosing of duloxetine; discussed it is not meant to be used as PRN and will not provide relief if used this way -Recommend to start taking duloxetine daily instead of PRN  Osteopenia (Goal prevent fractures) -Controlled - pt is taking Ca/Vitamin D supplement and walks for exercise regularly -Last DEXA Scan: 01/10/21   T-Score femoral neck: -1.2  T-Score total hip: -0.3  T-Score lumbar spine: -1.3  T-Score forearm radius: -1.0  10-year probability of major osteoporotic fracture: 14.8%  10-year probability of hip fracture: 1.5% -Patient is not a candidate for pharmacologic treatment -Current treatment  Calcium-Vitamin D  -Recommend [phone] units of vitamin D daily. Recommend 1200 mg of calcium daily from dietary and supplemental sources. Recommend weight-bearing and muscle strengthening exercises for building and maintaining bone density.  GERD (Goal: manage symptoms) -Not ideally controlled - GI advised pt to increase omeprazole to BID recently to potentially help with reflux/nausea, and pt has not done so -Current treatment  Omeprazole 40 mg BID Ondansetron 4 mg PRN Tums PRN -Counseled on manifestations of GERD including nausea, advised pt to try BID omeprazole as recommended previously by GI; if no improvement in symptoms, contact GI for endoscopy  Chronic constipation (Goal: manage symptoms) -Not ideally controlled - pt reports Linzess has improved frequency of bowel movements but consistenty is now loose, watery; she reports she has tried Miralax, Ex-lax and fiber supplements individually but not as a combination -Current treatment  Linzess 145 mcg daily ($125) Fiber  -Counseled on importance of fiber, hydration to improve constipation -Discusses Linzess appears to be too strong, causing diarrhea -Recommend trial of docusate 100 mg, Benefiber, and Miralax combination without Linzess; can use Linzess  PRN  Recurrent UTI (Goal: reduce UTI frequency) -Controlled - pt reports blood in urine has stopped (as of urology visit yesterday); she is using estradiol as prescribed -Current treatment  Estradiol 0.1 mg/gm vaginal cream MWF  -Recommended to continue current medication  Health Maintenance -Vaccine gaps: none  Patient Goals/Self-Care Activities Patient will:  - take medications as prescribed as evidenced by patient report and record review focus on medication adherence by pill box check blood pressure daily, document, and provide at future appointments target a minimum of 150 minutes of moderate intensity exercise weekly -Try docusate 100 mg, Benefiber, and Miralax combination without Linzess; can use Linzess PRN -Take duloxetine every day      Ms. Bjorn was given information about Chronic Care Management services today including:  CCM service includes personalized support from designated clinical staff supervised by her physician, including individualized plan of care and coordination with other care providers 24/7 contact phone numbers for assistance for urgent and routine care needs. Standard insurance, coinsurance, copays and deductibles apply for chronic care management only during months in which we provide at least 20 minutes of these services. Most insurances cover these services at 100%, however patients may be responsible for any copay, coinsurance and/or deductible if applicable. This service may  help you avoid the need for more expensive face-to-face services. Only one practitioner may furnish and bill the service in a calendar month. The patient may stop CCM services at any time (effective at the end of the month) by phone call to the office staff.  Patient agreed to services and verbal consent obtained.   Patient verbalizes understanding of instructions provided today and agrees to view in [place].  Telephone follow up appointment with pharmacy team member scheduled for:  3 months  [name], PharmD, [facility] Clinical Pharmacist [facility] [phone]

## 2021-04-26 NOTE — Progress Notes (Signed)
Chronic Care Management Pharmacy Note  04/26/2021 Name:  Valerie [name] MRN:  [id number] DOB:  September 13, 1950  Summary: -Dr Nehemiah Massed recently switched losartan 100 mg to valsartan 160 mg; Pt endorses compliance with this change -Pt reports BP at home 117/60-140/70 -Pt is taking duloxetine PRN, only takes ~1-2 times per month -Pt reports Linzess has lead to more frequent bowel movements but stools are very loose and watery -Pt did not increase omeprazole to BID as directed by GI to help with reflux/nausea  Recommendations/Changes made from today's visit: -Start taking duloxetine daily instead of PRN -Try bowel regimen: docusate 100 mg, Benefiber, Miralax daily without Linzess. Can take Linzess PRN  -Advised to increase omeprazole to BID as previously directed by GI; if no improvement contact GI for endoscopy  Plan: -[place] will call patient in 1 month for BP check/monitor constipation -Pharmacist follow up televisit scheduled for 3 months   Subjective: Valerie [name] is an 70 y.o. year old female who is a primary patient of Pleas Koch, NP.  The CCM team was consulted for assistance with disease management and care coordination needs.    Engaged with patient by telephone for initial visit in response to provider referral for pharmacy case management and/or care coordination services.   Consent to Services:  The patient was given the following information about Chronic Care Management services today, agreed to services, and gave verbal consent: 1. CCM service includes personalized support from designated clinical staff supervised by the primary care provider, including individualized plan of care and coordination with other care providers 2. 24/7 contact phone numbers for assistance for urgent and routine care needs. 3. Service will only be billed when office clinical staff spend 20 minutes or more in a month to coordinate care. 4. Only one practitioner may furnish and bill  the service in a calendar month. 5.The patient may stop CCM services at any time (effective at the end of the month) by phone call to the office staff. 6. The patient will be responsible for cost sharing (co-pay) of up to 20% of the service fee (after annual deductible is met). Patient agreed to services and consent obtained.  Patient Care Team: Pleas Koch, NP as PCP - General (Nurse Practitioner) Charlton Haws, [facility] as Pharmacist (Pharmacist)   Recent office visits: 04/08/21-PCP-Katherine Clark,NP-Patient presented for follow up hypertension.Take only 1 tablet of losartan $RemoveBef'100mg'BHonvYbwGc$  daily, increase amlodipine to $RemoveBefor'5mg'UOyGaiTHjFOr$  once daily.Labs ordered (Kidneys and electrolytes look good) consider imaging of the brain if headaches persist.  02/23/21-PCP-Katherine Clark,NP-Patient presented for follow up from hospital visit for epigastric and abdominal pain. Work on more hydration with water.  12/08/20-PCP-Katherine Clark,NP-Patient presented for follow up cystitis-Today she endorses that she was called a few days after her ED visit and was told to stop Keflex and to start cefdinir 300 mg. She's been taking this BID as directed, will finish in a few days.Stop taking cefdinir antibiotics. Start Bactrim DS (sulfamethoxazole/trimethoprim) tablets for urinary tract infection. Take 1 tablet by mouth twice daily for 3 days.Referral to [facility],labs ordered. UA today with 3+ blood, negative for leuks and nitrites. Culture sent.   Recent consult visits: 04/11/21 Dr Nehemiah Massed [facility] cardiology): f/u HTN,CAD. Change ARB to Valsartan 160 mg daily.  04/04/21-Gastroenterology-Varnita Tahiliani,MD-Patient presented for initial visit for epigastric pain, constipation. -Reviewed CT-abdomen/pelvis. Labs ordered (normal) Start Linzess,consider upper endoscopy.  03/24/21-Cardiology-Amanda Tobin,PA-Patient presented for hypertension and tachycardia. No symptoms at this time, EKG,normal,follow up 9  months  02/21/21-General Surgery-Diego Pabon,MD-  Patient presented for initial visit for partial bowel obstruction; No surgical intervention required.Discussed taking fiber supplement, metamucil ,benefiber daily with plenty of fluids and water. Follow up as needed  02/08/21-Urology-Brian Sninsky,MD-Patient presented for cystoscopy procedure. 12/16/20-Uology-Brian Sninsky,MD-Patient presented for recurrent UTI. Start trial of cranberry tablets and topical estrogen cream,follow up 6 months  12/15/20-Cardiology-Bruce Kowalski,MD-Patient presented for follow up.Continue to monitor palpitations,ECG ordered, increase atorvastatin to 20mg  take 1 tablet daily,Amlodipine 5mg  take 1 tablet daily  Hospital visits: 02/15/21-[facility] ED-Patient presented for palpitations, self-resolved. EKG and Labs normal. CT scan for abdominal tenderness - no acute process, Aortic atherosclerosis.  01/25/21 thru 01/28/21-[facility]-Admission for partial SBO. NG tube placed. Improved. F/u outpatient general surgery PRN. Hold: amlodipine  11/30/20-[facility]-Zachary Smith,MD-Patient presented for epigastric and abdominal pain,xrays,labs ordered,EKG,Will write short course of Keflex.  Patient also given Carafate and Maalox in the ED and felt little better on reassessment. No admission.   Objective:  Lab Results  Component Value Date   CREATININE 0.83 04/08/2021   BUN 19 04/08/2021   GFR 72.34 04/08/2021   GFRNONAA >60 02/15/2021   GFRAA >60 02/03/2020   NA 138 04/08/2021   K 3.8 04/08/2021   CALCIUM 9.4 04/08/2021   CO2 29 04/08/2021   GLUCOSE 98 04/08/2021    Lab Results  Component Value Date/Time   HGBA1C 5.5 06/29/2020 11:00 AM   HGBA1C 5.4 02/10/2019 09:01 AM   GFR 72.34 04/08/2021 12:18 PM   GFR 63.26 12/08/2020 10:40 AM    Last diabetic Eye exam: No results found for: HMDIABEYEEXA  Last diabetic Foot exam: No results found for: HMDIABFOOTEX   Lab Results  Component Value Date   CHOL 167 06/29/2020   HDL 42.60 06/29/2020    LDLCALC 102 (H) 06/29/2020   TRIG 111.0 06/29/2020   CHOLHDL 4 06/29/2020    Hepatic Function Latest Ref Rng & Units 04/04/2021 01/27/2021 01/25/2021  Total Protein 6.0 - 8.5 g/dL 7.1 6.3(L) 7.9  Albumin 3.8 - 4.8 g/dL 4.3 03/29/2021) 4.3  AST 0 - 40 IU/L 22 29 33  ALT 0 - 32 IU/L 17 16 20   Alk Phosphatase 44 - 121 IU/L 60 40 62  Total Bilirubin 0.0 - 1.2 mg/dL 0.7 2.1(H) 1.8(H)  Bilirubin, Direct 0.00 - 0.40 mg/dL 01/27/2021 - -    Lab Results  Component Value Date/Time   TSH 2.10 04/03/2019 09:42 AM    CBC Latest Ref Rng & Units 02/15/2021 01/27/2021 01/26/2021  WBC 4.0 - 10.5 K/uL 5.4 10.3 12.2(H)  Hemoglobin 12.0 - 15.0 g/dL 02/17/2021 03/29/2021 01/28/2021  Hematocrit 36.0 - 46.0 % 37.7 36.9 39.3  Platelets 150 - 400 K/uL 234 193 189    Lab Results  Component Value Date/Time   VD25OH 27.03 (L) 06/29/2020 11:00 AM   VD25OH 24.80 (L) 02/10/2019 09:01 AM    Clinical ASCVD: No  The 10-year ASCVD risk score (Arnett DK, et al., 2019) is: 14%   Values used to calculate the score:     Age: 31 years     Sex: Female     Is Non-Hispanic African American: No     Diabetic: No     Tobacco smoker: No     Systolic Blood Pressure: 151 mmHg     Is BP treated: Yes     HDL Cholesterol: 42.6 mg/dL     Total Cholesterol: 167 mg/dL    Depression screen [facility] 2/9 06/29/2020 10/21/2019 02/11/2018  Decreased Interest 2 0 0  Down, Depressed, Hopeless 1 0 0  PHQ - 2 Score 3  0 0  Altered sleeping 2 0 -  Tired, decreased energy 1 0 -  Change in appetite 1 0 -  Feeling bad or failure about yourself  1 0 -  Trouble concentrating 1 0 -  Moving slowly or fidgety/restless 3 0 -  Suicidal thoughts 0 0 -  PHQ-9 Score 12 0 -  Difficult doing work/chores Somewhat difficult Not difficult at all -  Some recent data might be hidden    GAD 7 : Generalized Anxiety Score 06/29/2020  Nervous, Anxious, on Edge 3  Control/stop worrying 2  Worry too much - different things 2  Trouble relaxing 3  Restless 3  Easily annoyed or irritable  0  Afraid - awful might happen 1  Total GAD 7 Score 14  Anxiety Difficulty Somewhat difficult     Social History   Tobacco Use  Smoking Status Never  Smokeless Tobacco Never   BP Readings from Last 3 Encounters:  04/25/21 (!) 151/91  04/08/21 137/84  04/04/21 (!) 151/80   Pulse Readings from Last 3 Encounters:  04/25/21 80  04/08/21 74  04/04/21 70   Wt Readings from Last 3 Encounters:  04/25/21 146 lb (66.2 kg)  04/08/21 147 lb (66.7 kg)  04/04/21 147 lb 9.6 oz (67 kg)   BMI Readings from Last 3 Encounters:  04/25/21 24.30 kg/m  04/08/21 24.46 kg/m  04/04/21 24.56 kg/m    Assessment/Interventions: Review of patient past medical history, allergies, medications, health status, including review of consultants reports, laboratory and other test data, was performed as part of comprehensive evaluation and provision of chronic care management services.   SDOH:  (Social Determinants of Health) assessments and interventions performed: Yes  SDOH Screenings   Alcohol Screen: Not on file  Depression (PHQ2-9): Medium Risk   PHQ-2 Score: 12  Financial Resource Strain: Not on file  Food Insecurity: Not on file  Housing: Not on file  Physical Activity: Not on file  Social Connections: Not on file  Stress: Not on file  Tobacco Use: Low Risk    Smoking Tobacco Use: Never   Smokeless Tobacco Use: Never   Passive Exposure: Not on file  Transportation Needs: Not on file    [place]  No Known Allergies  Medications Reviewed Today     Reviewed by Charlton Haws, [facility] (Pharmacist) on 04/26/21 at 1542  Med List Status: <None>   Medication Order Taking? Sig Documenting Provider Last Dose Status Informant  acetaminophen (TYLENOL) 500 MG tablet [id number] Yes Take 500-1,000 mg by mouth every 6 (six) hours as needed (for pain). [provider] Taking Active Self  amLODipine (NORVASC) 5 MG tablet [id number] Yes Take 1 tablet (5 mg total) by mouth daily. For  blood pressure. Pleas Koch, NP Taking Active   atorvastatin (LIPITOR) 20 MG tablet [id number] Yes Take 20 mg by mouth daily. [provider] Taking Active Self  Calcium Carbonate-Vitamin D (CALCIUM-VITAMIN D) 600-3.125 MG-MCG TABS [id number] Yes Take by mouth. [provider] Taking Active   DULoxetine (CYMBALTA) 20 MG capsule [id number] No Take 1 capsule (20 mg total) by mouth daily. For anxiety/depression/pain  Patient not taking: Reported on 04/26/2021   Pleas Koch, NP Not Taking Active Self  estradiol (ESTRACE) 0.1 MG/GM vaginal cream [id number] Yes Estrogen Cream Instruction Discard applicator Apply pea sized amount to tip of finger to urethra before bed. Wash hands well after application. Use Monday, Wednesday and Friday Billey Co, MD Taking Active   linaclotide Rolan Lipa) [phone]  MCG CAPS capsule [id number] Yes Take 1 capsule (145 mcg total) by mouth daily. Virgel Manifold, MD Taking Active   metoprolol succinate (TOPROL-XL) 50 MG 24 hr tablet [id number] Yes Take 1 tablet (50 mg total) by mouth daily. For heart rate/palpitations. Pleas Koch, NP Taking Active Self  omeprazole (PRILOSEC) 40 MG capsule [id number] Yes Take 1 capsule (40 mg total) by mouth 2 (two) times daily for 28 days. For heartburn. Virgel Manifold, MD Taking Active   ondansetron [facility]) 4 MG tablet [id number] Yes Take 1 tablet (4 mg total) by mouth every 6 (six) hours as needed for nausea. Loletha Grayer, MD Taking Active   valsartan (DIOVAN) 160 MG tablet [id number] Yes Take 160 mg by mouth daily. [provider] Taking Active             Patient Active Problem List   Diagnosis Date Noted   Lactic acidosis    Dehydration    Partial small bowel obstruction ([place]) 01/26/2021   Epigastric pain    Depression    Acute cystitis with hematuria 12/08/2020   Microscopic hematuria 12/08/2020   COVID-19 virus infection 09/28/2020   Chronic constipation 08/13/2020    Dysuria 08/13/2020   Encounter for screening colonoscopy    Otalgia of both ears 01/28/2020   Tinnitus of both ears 10/02/2019   ETD (Eustachian tube dysfunction), right 04/29/2019   Weakness of both lower extremities 04/03/2019   Chronic back pain 04/03/2019   Encounter for annual general medical examination with abnormal findings in adult 02/14/2019   Vulvar dermatitis 01/01/2018   Allergic rhinitis 08/22/2017   Neck pain with history of cervical spinal surgery 01/22/2017   Prediabetes 11/16/2016   Hyperlipidemia 11/16/2015   Esophageal reflux    Hemorrhoid 06/30/2015   Atypical chest pain 01/04/2015   Palpitations 01/04/2015   Welcome to Medicare preventive visit 10/09/2014   Benign paroxysmal positional vertigo 08/31/2014   Hernia    Menorrhagia    Osteopenia    Vitamin D deficiency    Adenocarcinoma of the endometrium/uterus ([place])    Generalized anxiety disorder 01/23/2007   Essential hypertension 01/23/2007    Immunization History  Administered Date(s) Administered   Fluad Quad(high Dose 65+) 02/14/2019, 02/23/2021   Hepatitis B 11/25/2015, 05/29/2016   Hepatitis B, adult 09/22/2015   Influenza, High Dose Seasonal PF 05/29/2016   Influenza,inj,Quad PF,6+ Mos 02/11/2018   Influenza-Unspecified 02/27/2015, 04/19/2020   PFIZER(Purple Top)SARS-COV-2 Vaccination 06/20/2019, 07/11/2019, 02/26/2020, 01/04/2021   PNEUMOCOCCAL CONJUGATE-20 03/12/2021   Td 05/29/1997   Tdap 10/09/2014, 09/22/2015   Zoster Recombinat (Shingrix) 01/04/2021, 03/12/2021   Zoster, Live 07/28/2015    Conditions to be addressed/monitored:  Hypertension, Hyperlipidemia, Depression, Anxiety, Osteopenia, and Chronic constipation  Care Plan : [place]  Updates made by Charlton Haws, [place] since 04/26/2021 12:00 AM     Problem: Hypertension, Hyperlipidemia, Depression, Anxiety, Osteopenia, and Chronic constipation   Priority: High     Long-Range Goal: Disease mgmt   Start  Date: 04/26/2021  Expected End Date: 04/26/2022  This Visit's Progress: On track  Priority: High  Note:   Current Barriers:  Unable to independently monitor therapeutic efficacy  Pharmacist Clinical Goal(s):  Patient will achieve adherence to monitoring guidelines and medication adherence to achieve therapeutic efficacy through collaboration with PharmD and provider.   Interventions: 1:1 collaboration with Pleas Koch, NP regarding development and update of comprehensive plan of care as evidenced by provider attestation and co-signature Inter-disciplinary care team collaboration (see longitudinal plan of  care) Comprehensive medication review performed; medication list updated in electronic medical record  Hypertension (BP goal <130/80) -Controlled - pt-reported BP at home is at goal; pt denies headaches, dizziness, lightheadedness; she reports cardiologist recently changed losartan to valsartan and she endorses compliance with this change; white coat syndrome may explain recent high BP in office; she has recent hx of palpitations -Current home readings: 117/60 -Current treatment: Amlodipine 5 mg daily Metoprolol succinate 50 mg daily Valsartan 160 mg daily -Medications previously tried: losartan  -Educated on BP goals and benefits of medications for prevention of heart attack, stroke and kidney damage; Daily salt intake goal < 2300 mg; Exercise goal of 150 minutes per week; Importance of home blood pressure monitoring; -Counseled to monitor BP at home daily, document, and provide log at future appointments; advised to bring BP monitor to office visit with PCP to callibrate -Recommended to continue current medication  Hyperlipidemia: (LDL goal < 70) -Not ideally controlled - LDL is above goal, statin was increased July 2022, no repeat lipids yet; pt endorses compliance with statin -Current treatment: Atorvastatin 20 mg daily -Educated on Cholesterol goals; Benefits of statin for  ASCVD risk reduction; -Recommended to continue current medication  Depression/Anxiety (Goal: manage symptoms) -Not ideally controlled - pt is not taking duloxetine except for 1-2 times per month as needed; she reports the medication does not usually help anxiety when she takes it this way -Current treatment: Duloxetine 20 mg daily -PHQ9: 12 (06/2020) -GAD7: 14 (06/2020) -Connected with PCP for mental health support -Educated on daily dosing of duloxetine; discussed it is not meant to be used as PRN and will not provide relief if used this way -Recommend to start taking duloxetine daily instead of PRN  Osteopenia (Goal prevent fractures) -Controlled - pt is taking Ca/Vitamin D supplement and walks for exercise regularly -Last DEXA Scan: 01/10/21   T-Score femoral neck: -1.2  T-Score total hip: -0.3  T-Score lumbar spine: -1.3  T-Score forearm radius: -1.0  10-year probability of major osteoporotic fracture: 14.8%  10-year probability of hip fracture: 1.5% -Patient is not a candidate for pharmacologic treatment -Current treatment  Calcium-Vitamin D  -Recommend [phone] units of vitamin D daily. Recommend 1200 mg of calcium daily from dietary and supplemental sources. Recommend weight-bearing and muscle strengthening exercises for building and maintaining bone density.  GERD (Goal: manage symptoms) -Not ideally controlled - GI advised pt to increase omeprazole to BID recently to potentially help with reflux/nausea, and pt has not done so -Current treatment  Omeprazole 40 mg BID Ondansetron 4 mg PRN Tums PRN -Counseled on manifestations of GERD including nausea, advised pt to try BID omeprazole as recommended previously by GI; if no improvement in symptoms, contact GI for endoscopy  Chronic constipation (Goal: manage symptoms) -Not ideally controlled - pt reports Linzess has improved frequency of bowel movements but consistenty is now loose, watery; she reports she has tried Miralax,  Ex-lax and fiber supplements individually but not as a combination -Current treatment  Linzess 145 mcg daily ($125) Fiber  -Counseled on importance of fiber, hydration to improve constipation -Discusses Linzess appears to be too strong, causing diarrhea -Recommend trial of docusate 100 mg, Benefiber, and Miralax combination without Linzess; can use Linzess PRN  Recurrent UTI (Goal: reduce UTI frequency) -Controlled - pt reports blood in urine has stopped (as of urology visit yesterday); she is using estradiol as prescribed -Current treatment  Estradiol 0.1 mg/gm vaginal cream MWF  -Recommended to continue current medication  Health Maintenance -Vaccine gaps: none  Patient Goals/Self-Care Activities Patient will:  - take medications as prescribed as evidenced by patient report and record review focus on medication adherence by pill box check blood pressure daily, document, and provide at future appointments target a minimum of 150 minutes of moderate intensity exercise weekly -Try docusate 100 mg, Benefiber, and Miralax combination without Linzess; can use Linzess PRN -Take duloxetine every day      Medication Assistance: None required.  Patient affirms current coverage meets needs.  Compliance/Adherence/Medication fill history: Care Gaps: Annual wellness visit in last year? Yes 06/29/20  Star-Rating Drugs: Atorvastatin 20 mg - LF 03/11/21 x 90 ds; PDC 74% Losartan 100 mg - LF 02/25/21 x 90 ds; [place] 73%  Patient's preferred pharmacy is:  [facility] [address] [place] [address] Phone: [phone] Fax: [phone]  [facility] [place], [address] [place] [address] Phone: [phone] Fax: [phone]  [place], [address] [place] [address] Phone: [phone] Fax:  [phone]  [id number] [place], [address] [place] [address] Phone: [phone] Fax: [phone]  Uses pill box? Yes Pt endorses 100% compliance  We discussed: Current pharmacy is preferred with insurance plan and patient is satisfied with pharmacy services Patient decided to: Continue current medication management strategy  Care Plan and Follow Up Patient Decision:  Patient agrees to Care Plan and Follow-up.  Plan: Telephone follow up appointment with care management team member scheduled for:  3 months  [name], PharmD, BCACP Clinical Pharmacist [facility] [phone]

## 2021-04-27 ENCOUNTER — Other Ambulatory Visit: Payer: Self-pay

## 2021-04-27 ENCOUNTER — Encounter: Payer: Self-pay | Admitting: Primary Care

## 2021-04-27 ENCOUNTER — Ambulatory Visit (INDEPENDENT_AMBULATORY_CARE_PROVIDER_SITE_OTHER): Payer: Medicare HMO | Admitting: Primary Care

## 2021-04-27 DIAGNOSIS — I10 Essential (primary) hypertension: Secondary | ICD-10-CM | POA: Diagnosis not present

## 2021-04-27 DIAGNOSIS — E785 Hyperlipidemia, unspecified: Secondary | ICD-10-CM

## 2021-04-27 MED ORDER — AMLODIPINE BESYLATE 5 MG PO TABS: 5.0000 mg | ORAL_TABLET | Freq: Every day | ORAL | 3 refills | Status: DC

## 2021-04-27 NOTE — Assessment & Plan Note (Signed)
Improved, both in the office and with home readings.  Continue valsartan 160 mg and amlodipine 5 mg daily.  Discussed to stop amlodipine 5 mg and notify me if she continues to see BP readings below 482 systolic.   Refills for amlodipine 5 mg provided.

## 2021-04-27 NOTE — Patient Instructions (Signed)
Continue valsartan 160 mg and amlodipine 5 mg for blood pressure.  Notify me if you continue to see readings on top below 100.   It was a pleasure to see you today!

## 2021-04-27 NOTE — Progress Notes (Signed)
Subjective:    Patient ID: Valerie [name], female    DOB: December 27, 1950, 70 y.o.   MRN: [id number]  HPI  Valerie [name] is a very pleasant 70 y.o. female with a history of hypertension, partial bowel obstruction, recurrent cystitis, GAD, prediabetes, hyperlipidemia who presents today for follow up of hypertension.  She was last evaluated on 04/08/21 for hypertension with home readings ranging 140-190/90's-100's with headaches despite management on losartan 100 mg (patient increased dose to 150 mg) and amlodipine 2.5 mg. During this visit she was advised to reduce down to 100 mg of losartan and to refrain from adjusting her medications without my consent, and increased amlodipine to 5 mg. She is here today for follow up.  Since her last visit she is compliant to her amlodipine 5 mg, valsartan 160 mg. She's checking BP at home which is running 110's-120's/60's-80's. A few times with systolic readings of 94'W-96'P with mild dizziness, typically improves quickly .   She saw her cardiologist recently who changed her losartan 100 mg to valsartan 160 mg. She does notice dizziness at times with rapid position changes.   BP Readings from Last 3 Encounters:  04/27/21 134/72  04/25/21 (!) 151/91  04/08/21 137/84   Wt Readings from Last 3 Encounters:  04/27/21 146 lb (66.2 kg)  04/25/21 146 lb (66.2 kg)  04/08/21 147 lb (66.7 kg)      Review of Systems  Respiratory:  Negative for shortness of breath.   Cardiovascular:  Negative for chest pain.  Neurological:  Positive for light-headedness. Negative for headaches.        Past Medical History:  Diagnosis Date   Acid reflux    Adenocarcinoma of the endometrium/uterus ([place]) 12/04/2000   Anal fissure    Atrophic vaginitis    Difficult intubation    limited neck mobility   Esophagitis    Hernia    Present to proximal to umbillical    History of hiatal hernia    Hyperlipidemia    Hypertension    Internal hemorrhoids    Menorrhagia     Osteopenia    PONV (postoperative nausea and vomiting)    Vertigo    Vitamin D deficiency     Social History   Socioeconomic History   Marital status: Married    Spouse name: Not on file   Number of children: Not on file   Years of education: Not on file   Highest education level: Not on file  Occupational History   Not on file  Tobacco Use   Smoking status: Never   Smokeless tobacco: Never  Vaping Use   Vaping Use: Never used  Substance and Sexual Activity   Alcohol use: No   Drug use: Never   Sexual activity: Yes    Birth control/protection: Post-menopausal, Surgical  Other Topics Concern   Not on file  Social History Narrative   Married   Lives in [place]   Has 2 children, 4 grandchildren   Enjoys walking, shopping.   Social Determinants of Health   Financial Resource Strain: Not on file  Food Insecurity: Not on file  Transportation Needs: Not on file  Physical Activity: Not on file  Stress: Not on file  Social Connections: Not on file  Intimate Partner Violence: Not on file    Past Surgical History:  Procedure Laterality Date   52 HOUR [place] STUDY N/A 11/29/2015   Procedure: [place];  Surgeon: Manus Gunning, MD;  Location: [facility];  Service: Gastroenterology;  Laterality: N/A;   ABDOMINAL HYSTERECTOMY  12/04/2000   also bilateral salpingo-oophorectomy   CARDIAC CATHETERIZATION  2006   CARPAL TUNNEL RELEASE  11/20/2011   Procedure: CARPAL TUNNEL RELEASE;  Surgeon: Elaina Hoops, MD;  Location: [facility];  Service: Neurosurgery;  Laterality: Left;  LEFT carpal tunnel release   CERVICAL SPINE SURGERY  2013   [place]   COLONOSCOPY WITH PROPOFOL N/A 07/27/2020   Procedure: COLONOSCOPY WITH PROPOFOL;  Surgeon: Lucilla Lame, MD;  Location: [facility] ENDOSCOPY;  Service: Endoscopy;  Laterality: N/A;   DILATION AND CURETTAGE OF UTERUS     11/15/2000   ESOPHAGEAL MANOMETRY N/A 09/20/2015   Procedure: ESOPHAGEAL MANOMETRY (EM);   Surgeon: Manus Gunning, MD;  Location: [facility];  Service: Gastroenterology;  Laterality: N/A;   HYSTEROSCOPY  11/15/2000   D & C, resection of endometrial polyps   OOPHORECTOMY  12/04/2000   bilateral salpingo-oophorectomy done with TAH   SPINAL CORD STIMULATOR INSERTION Bilateral 04/12/2020   Procedure: INSERTION CERVICAL SPINAL STIMULATOR PULSE GENERATOR;  Surgeon: [name], MD;  Location: [facility] ORS;  Service: Neurosurgery;  Laterality: Bilateral;   SPINAL CORD STIMULATOR TRIAL N/A 03/22/2020   Procedure: CERVICAL SPINAL CORD STIMULATOR TRIAL PERCUTANEOUS;  Surgeon: [name], MD;  Location: [facility] ORS;  Service: Neurosurgery;  Laterality: N/A;   TUBAL LIGATION      Family History  Problem Relation Age of Onset   Hypertension Father    Prostate cancer Father    Hypertension Sister    Lung cancer Sister    Diabetes Brother    Heart disease Brother    Hypertension Brother    Diabetes Brother    Heart disease Brother    Breast cancer Neg Hx     No Known Allergies  Current Outpatient Medications on File Prior to Visit  Medication Sig Dispense Refill   acetaminophen (TYLENOL) 500 MG tablet Take 500-1,000 mg by mouth every 6 (six) hours as needed (for pain).     amLODipine (NORVASC) 5 MG tablet Take 1 tablet (5 mg total) by mouth daily. For blood pressure. 30 tablet 0   atorvastatin (LIPITOR) 20 MG tablet Take 20 mg by mouth daily.     Calcium Carbonate-Vitamin D (CALCIUM-VITAMIN D) 600-3.125 MG-MCG TABS Take by mouth.     linaclotide (LINZESS) 145 MCG CAPS capsule Take 1 capsule (145 mcg total) by mouth daily. 30 capsule 2   metoprolol succinate (TOPROL-XL) 50 MG 24 hr tablet Take 1 tablet (50 mg total) by mouth daily. For heart rate/palpitations. 90 tablet 3   omeprazole (PRILOSEC) 40 MG capsule Take 1 capsule (40 mg total) by mouth 2 (two) times daily for 28 days. For heartburn. 56 capsule 0   ondansetron (ZOFRAN) 4 MG tablet Take 1 tablet (4 mg total) by mouth  every 6 (six) hours as needed for nausea. 20 tablet 0   valsartan (DIOVAN) 160 MG tablet Take 160 mg by mouth daily.     DULoxetine (CYMBALTA) 20 MG capsule Take 1 capsule (20 mg total) by mouth daily. For anxiety/depression/pain (Patient not taking: Reported on 04/27/2021) 90 capsule 3   estradiol (ESTRACE) 0.1 MG/GM vaginal cream Estrogen Cream Instruction Discard applicator Apply pea sized amount to tip of finger to urethra before bed. Wash hands well after application. Use Monday, Wednesday and Friday (Patient not taking: Reported on 04/27/2021) 42.5 g 3   No current facility-administered medications on file prior to visit.    BP 134/72  Pulse 80   Temp (!) 97.3 F (36.3 C) (Temporal)   Ht 5\' 5"  (1.651 m)   Wt 146 lb (66.2 kg)   SpO2 (!) 80%   BMI 24.30 kg/m  Objective:   Physical Exam Cardiovascular:     Rate and Rhythm: Normal rate and regular rhythm.  Pulmonary:     Effort: Pulmonary effort is normal.     Breath sounds: Normal breath sounds.  Musculoskeletal:     Cervical back: Neck supple.  Skin:    General: Skin is warm and dry.          Assessment & Plan:      This visit occurred during the SARS-CoV-2 public health emergency.  Safety protocols were in place, including screening questions prior to the visit, additional usage of staff PPE, and extensive cleaning of exam room while observing appropriate contact time as indicated for disinfecting solutions.

## 2021-04-30 LAB — CULTURE, URINE COMPREHENSIVE

## 2021-05-02 ENCOUNTER — Telehealth: Payer: Self-pay

## 2021-05-02 MED ORDER — NITROFURANTOIN MONOHYD MACRO 100 MG PO CAPS: 100.0000 mg | ORAL_CAPSULE | Freq: Two times a day (BID) | ORAL | 0 refills | Status: AC

## 2021-05-02 NOTE — Telephone Encounter (Signed)
-----   Message from Debroah Loop, [place] sent at 05/02/2021  8:49 AM EST ----- Please confirm that she is still not experiencing infective symptoms. If she has developed these, ok to send in [place] 100mg  BID x5 days. Otherwise this represents asymptomatic bacteriuria and does not require tx. ----- Message ----- From: Interface, [facility] Lab Results In Sent: 04/25/2021   4:36 PM EST To: Debroah Loop, PA-C

## 2021-05-02 NOTE — Telephone Encounter (Signed)
Patient reports dysuria currently. Abx sent to patients pharmacy.

## 2021-05-02 NOTE — Progress Notes (Signed)
Chronic Care Management Pharmacy Assistant   Name: Valerie [name]  MRN: [id number] DOB: 09-26-1950  Reason for Encounter: CCM (Hypertension Disease State)   Recent office visits:  04/27/2021 - Alma Friendly, NP - Patient presented for essential hypertension. Continue current medications.   Recent consult visits:  None sine last CCM contact  Hospital visits:  None since last CCM contact  Medications: Outpatient Encounter Medications as of 05/02/2021  Medication Sig   acetaminophen (TYLENOL) 500 MG tablet Take 500-1,000 mg by mouth every 6 (six) hours as needed (for pain).   amLODipine (NORVASC) 5 MG tablet Take 1 tablet (5 mg total) by mouth daily. For blood pressure.   atorvastatin (LIPITOR) 20 MG tablet Take 20 mg by mouth daily.   Calcium Carbonate-Vitamin D (CALCIUM-VITAMIN D) 600-3.125 MG-MCG TABS Take by mouth.   DULoxetine (CYMBALTA) 20 MG capsule Take 1 capsule (20 mg total) by mouth daily. For anxiety/depression/pain (Patient not taking: Reported on 04/27/2021)   estradiol (ESTRACE) 0.1 MG/GM vaginal cream Estrogen Cream Instruction Discard applicator Apply pea sized amount to tip of finger to urethra before bed. Wash hands well after application. Use Monday, Wednesday and Friday (Patient not taking: Reported on 04/27/2021)   linaclotide (LINZESS) 145 MCG CAPS capsule Take 1 capsule (145 mcg total) by mouth daily.   metoprolol succinate (TOPROL-XL) 50 MG 24 hr tablet Take 1 tablet (50 mg total) by mouth daily. For heart rate/palpitations.   omeprazole (PRILOSEC) 40 MG capsule Take 1 capsule (40 mg total) by mouth 2 (two) times daily for 28 days. For heartburn.   ondansetron (ZOFRAN) 4 MG tablet Take 1 tablet (4 mg total) by mouth every 6 (six) hours as needed for nausea.   valsartan (DIOVAN) 160 MG tablet Take 160 mg by mouth daily.   No facility-administered encounter medications on file as of 05/02/2021.   Recent Office Vitals: BP Readings from Last 3 Encounters:   04/27/21 134/72  04/25/21 (!) 151/91  04/08/21 137/84   Pulse Readings from Last 3 Encounters:  04/27/21 80  04/25/21 80  04/08/21 74    Wt Readings from Last 3 Encounters:  04/27/21 146 lb (66.2 kg)  04/25/21 146 lb (66.2 kg)  04/08/21 147 lb (66.7 kg)    Kidney Function Lab Results  Component Value Date/Time   CREATININE 0.83 04/08/2021 12:18 PM   CREATININE 0.75 02/15/2021 11:49 AM   CREATININE 0.96 07/31/2013 05:27 PM   CREATININE 1.29 06/29/2012 04:49 PM   GFR 72.34 04/08/2021 12:18 PM   GFRNONAA >60 02/15/2021 11:49 AM   GFRNONAA >60 07/31/2013 05:27 PM   GFRAA >60 02/03/2020 09:56 AM   GFRAA >60 07/31/2013 05:27 PM   BMP Latest Ref Rng & Units 04/08/2021 02/15/2021 01/28/2021  Glucose 70 - 99 mg/dL 98 115(H) 115(H)  BUN 6 - 23 mg/dL 19 12 19   Creatinine 0.40 - 1.20 mg/dL 0.83 0.75 0.61  Sodium 135 - 145 mEq/L 138 141 134(L)  Potassium 3.5 - 5.1 mEq/L 3.8 3.7 3.5  Chloride 96 - 112 mEq/L 103 106 108  CO2 19 - 32 mEq/L 29 28 24   Calcium 8.4 - 10.5 mg/dL 9.4 9.5 7.9(L)   Contacted patient on 05/09/2021 to discuss hypertension disease state  Current antihypertensive regimen:  Amlodipine 5 mg daily - PRN - if it is too low do not take Metoprolol succinate 50 mg daily Valsartan 160 mg daily  Patient verbally confirms she is taking the above medications as directed. Yes  How often are you checking your  Blood Pressure? daily  she checks her blood pressure in the morning after taking her medication.  Current home BP readings: Patient could not get her machine to display her readings. I asked patient to take her reading daily and log it. Advised her I would call back on Friday 05/13/21 to get her readings from her. Patient understood and agreed.   Wrist or arm cuff: Arm cuff Caffeine intake: Not often - Caffeine free drinks Salt intake: No salt on any food OTC medications including pseudoephedrine or NSAIDs? Occasionally will take Tylenol - 1 tablet - 500mg   Any  readings above 180/120? No  What recent interventions/DTPs have been made by any provider to improve Blood Pressure control since last CPP Visit: Continue valsartan 160 mg and amlodipine 5 mg for blood pressure. If blood pressure is low do not take Amlodipine.   Any recent hospitalizations or ED visits since last visit with CPP? No  What diet changes have been made to improve Blood Pressure Control?  No diet changes.   What exercise is being done to improve your Blood Pressure Control?  Patient states she walks daily usually 0.5 mile. Also has an exercise bike.   Adherence Review: Is the patient currently on ACE/ARB medication? Yes Does the patient have >5 day gap between last estimated fill dates? No  Star Rating Drugs:  Medication:  Last Fill: Day Supply Valsartan 160 mg 02/25/2021 90  Atorvastatin 20 mg 03/11/2021 90  Care Gaps: Annual wellness visit in last year? Yes 06/29/2020 Most Recent BP reading: 134/72 on 04/27/2021  Upcoming appointment with GI on 05/12/2021  [name], CPP notified  [name], [place] [phone]  Time Spent: 45 Minutes

## 2021-05-04 ENCOUNTER — Other Ambulatory Visit: Payer: Self-pay | Admitting: Primary Care

## 2021-05-04 DIAGNOSIS — E785 Hyperlipidemia, unspecified: Secondary | ICD-10-CM

## 2021-05-04 DIAGNOSIS — I10 Essential (primary) hypertension: Secondary | ICD-10-CM

## 2021-05-12 ENCOUNTER — Ambulatory Visit (INDEPENDENT_AMBULATORY_CARE_PROVIDER_SITE_OTHER): Payer: Medicare HMO | Admitting: Gastroenterology

## 2021-05-12 ENCOUNTER — Encounter: Payer: Self-pay | Admitting: Gastroenterology

## 2021-05-12 VITALS — BP 124/82 | HR 64 | Temp 98.4°F | Wt 147.2 lb

## 2021-05-12 DIAGNOSIS — R195 Other fecal abnormalities: Secondary | ICD-10-CM | POA: Diagnosis not present

## 2021-05-12 DIAGNOSIS — R1013 Epigastric pain: Secondary | ICD-10-CM

## 2021-05-12 DIAGNOSIS — R110 Nausea: Secondary | ICD-10-CM

## 2021-05-12 MED ORDER — LINACLOTIDE 290 MCG PO CAPS: 290.0000 ug | ORAL_CAPSULE | Freq: Every day | ORAL | 0 refills | Status: DC

## 2021-05-12 NOTE — Progress Notes (Signed)
Valerie Antigua, MD [address]  [place]  [place], [place] [address]  Main: [phone]  Fax: [phone]   Primary Care Physician: Pleas Koch, NP   Chief complaint: Nausea, constipation  HPI: Valerie [name] is a 70 y.o. female here for follow-up and is reporting continued nausea symptoms despite increasing PPI on last visit for intermittent epigastric pain.  Is also reporting that she only has a bowel movement about twice a week and the day that she has a bowel movement she will have 2-3 loose bowel movements, otherwise she will not have a bowel movement on the other days.  Is continuing to take Linzess 145 MCG a day.  Is also to is also taking an over-the-counter laxative in addition to this.  Previous History: This is a patient previously seen by gastroenterologist, Dr. [place] Cellar, and diagnosed with longstanding reflux, being referred to [place] at this time by Dr. Dahlia Byes for epigastric pain and constipation.  Patient was admitted in September 2022, discharge summary and Dr. Corlis Leak clinic note reviewed, and patient was diagnosed with partial small bowel obstruction at that time, that was conservatively managed.  CT showed previous evidence of cholecystectomy, with no other acute findings.  Patient describes having chronic constipation and takes Ex-Lax as needed and has a bowel movement only about once or twice a week.  No blood in stool.  Reports having epigastric pain, daily breakthrough heartburn symptoms despite PPI therapy, associated with nausea and vomiting, but no hematemesis.  No dysphagia  Most recent colonoscopy in March 2022 by Dr. Allen Norris for screening was normal except for internal hemorrhoids and repeat recommended in 10 years.  As per previous 2017 notes by Dr. Havery Moros patient underwent pH study, manometry and her symptoms were thought to be due to functional heartburn and a trial of TCA, desipramine was recommended.  Patient does not recall if this  helped or not.  As per his notes pH testing results as follows  "Results as follows: Study performed ON PPI   Results: - Study time of 24 hours - adequate study time - Demeester score of 1.5 (14.72 = upper limit of normal) - no pathologic acid reflux - Symptom index of 0% for reflux episodes (none of the 10 episodes of reported symptoms were due to reflux)   Summary and Interpretation: - this is a normal study without pathologic reflux. Study shows reflux is well controlled on present dose of medications - none of the patients reported symptoms were due to reflux - this study is consistent with functional heartburn if the patient continues to have heartburn symptoms     I will contact the patient with results and further recommendations, I would consider a trial of TCA to treat functional heartburn"  His clinic notes also state:  "Prior endoscopic evaluation: EGD 2005 - LA grade A esophagitis, empiric dilation to 8mm [place] Colonoscopy done in [place] a few years ago, she thinks she may have had a polyp removed  EGD done on 08/30/15 showing mild LA Grade A esophagitis. Otherwise a normal study   Esophageal manometry done showing hypotensive EG junction with evidence of relaxation after deglutition. Findings suggestive of Fragmented Peristalsis, otherwise no other significant esophageal peristaltic abnormality was detected on this study based on [place] classification.  Manometry showed fragmented peristalsis and hypotensive GEJ. I suspect the manometry finding may be causing her dysphagia, as no pathology is noted otherwise, and she does not have any symptoms of oropharyngeal dysphagia. I discussed with  her that she has a nonspecific mild motility disorder, but unfortunately no good medical therapy to help this at present time. Her hypotensive GEJ may be driving her refractory reflux symptoms."   ROS: All ROS reviewed and negative except as per HPI   Past Medical History:   Diagnosis Date   Acid reflux    Adenocarcinoma of the endometrium/uterus ([place]) 12/04/2000   Anal fissure    Atrophic vaginitis    Difficult intubation    limited neck mobility   Esophagitis    Hernia    Present to proximal to umbillical    History of hiatal hernia    Hyperlipidemia    Hypertension    Internal hemorrhoids    Menorrhagia    Osteopenia    PONV (postoperative nausea and vomiting)    Vertigo    Vitamin D deficiency     Past Surgical History:  Procedure Laterality Date   2 HOUR [place] STUDY N/A 11/29/2015   Procedure: 24 HOUR PH STUDY;  Surgeon: Manus Gunning, MD;  Location: [facility];  Service: Gastroenterology;  Laterality: N/A;   ABDOMINAL HYSTERECTOMY  12/04/2000   also bilateral salpingo-oophorectomy   CARDIAC CATHETERIZATION  2006   CARPAL TUNNEL RELEASE  11/20/2011   Procedure: CARPAL TUNNEL RELEASE;  Surgeon: Elaina Hoops, MD;  Location: [facility];  Service: Neurosurgery;  Laterality: Left;  LEFT carpal tunnel release   CERVICAL SPINE SURGERY  2013   [place]   COLONOSCOPY WITH PROPOFOL N/A 07/27/2020   Procedure: COLONOSCOPY WITH PROPOFOL;  Surgeon: Lucilla Lame, MD;  Location: [facility] ENDOSCOPY;  Service: Endoscopy;  Laterality: N/A;   DILATION AND CURETTAGE OF UTERUS     11/15/2000   ESOPHAGEAL MANOMETRY N/A 09/20/2015   Procedure: ESOPHAGEAL MANOMETRY (EM);  Surgeon: Manus Gunning, MD;  Location: [facility];  Service: Gastroenterology;  Laterality: N/A;   HYSTEROSCOPY  11/15/2000   D & C, resection of endometrial polyps   OOPHORECTOMY  12/04/2000   bilateral salpingo-oophorectomy done with TAH   SPINAL CORD STIMULATOR INSERTION Bilateral 04/12/2020   Procedure: INSERTION CERVICAL SPINAL STIMULATOR PULSE GENERATOR;  Surgeon: [name], MD;  Location: [facility] ORS;  Service: Neurosurgery;  Laterality: Bilateral;   SPINAL CORD STIMULATOR TRIAL N/A 03/22/2020   Procedure: CERVICAL SPINAL  CORD STIMULATOR TRIAL PERCUTANEOUS;  Surgeon: [name], MD;  Location: [facility] ORS;  Service: Neurosurgery;  Laterality: N/A;   TUBAL LIGATION      Prior to Admission medications   Medication Sig Start Date End Date Taking? Authorizing Provider  acetaminophen (TYLENOL) 500 MG tablet Take 500-1,000 mg by mouth every 6 (six) hours as needed (for pain).   Yes [provider]  amLODipine (NORVASC) 5 MG tablet Take 1 tablet (5 mg total) by mouth daily. For blood pressure. 04/27/21  Yes Pleas Koch, NP  atorvastatin (LIPITOR) 20 MG tablet Take 20 mg by mouth daily. 12/15/20 12/15/21 Yes [provider]  Calcium Carbonate-Vitamin D (CALCIUM-VITAMIN D) 600-3.125 MG-MCG TABS Take by mouth.   Yes [provider]  DULoxetine (CYMBALTA) 20 MG capsule Take 1 capsule (20 mg total) by mouth daily. For anxiety/depression/pain 08/19/20  Yes Pleas Koch, NP  estradiol (ESTRACE) 0.1 MG/GM vaginal cream Estrogen Cream Instruction Discard applicator Apply pea sized amount to tip of finger to urethra before bed. Wash hands well after application. Use Monday, Wednesday and Friday 12/16/20  Yes Billey Co, MD  linaclotide [facility]) 290 MCG CAPS capsule Take 1 capsule (290  mcg total) by mouth daily. 05/12/21 08/10/21 Yes Valerie [name] B, MD  metoprolol succinate (TOPROL-XL) 50 MG 24 hr tablet Take 1 tablet (50 mg total) by mouth daily. For heart rate/palpitations. 08/12/20  Yes Pleas Koch, NP  ondansetron (ZOFRAN) 4 MG tablet Take 1 tablet (4 mg total) by mouth every 6 (six) hours as needed for nausea. 01/28/21  Yes Wieting, Richard, MD  valsartan (DIOVAN) 160 MG tablet Take 160 mg by mouth daily.   Yes [provider]  omeprazole (PRILOSEC) 40 MG capsule Take 1 capsule (40 mg total) by mouth 2 (two) times daily for 28 days. For heartburn. 04/04/21 05/02/21  Virgel Manifold, MD    Family History  Problem Relation Age of Onset   Hypertension Father     Prostate cancer Father    Hypertension Sister    Lung cancer Sister    Diabetes Brother    Heart disease Brother    Hypertension Brother    Diabetes Brother    Heart disease Brother    Breast cancer Neg Hx      Social History   Tobacco Use   Smoking status: Never   Smokeless tobacco: Never  Vaping Use   Vaping Use: Never used  Substance Use Topics   Alcohol use: No   Drug use: Never    Allergies as of 05/12/2021   (No Known Allergies)    Physical Examination:  Constitutional: General:   Alert,  Well-developed, well-nourished, pleasant and cooperative in NAD BP 124/82    Pulse 64    Temp 98.4 F (36.9 C) (Oral)    Wt 147 lb 3.2 oz (66.8 kg)    BMI 24.50 kg/m   Respiratory: Normal respiratory effort  Gastrointestinal:  Soft, non-tender and non-distended without masses, hepatosplenomegaly or hernias noted.  No guarding or rebound tenderness.     Cardiac: No clubbing or edema.  No cyanosis. Normal posterior tibial pedal pulses noted.  Psych:  Alert and cooperative. Normal mood and affect.  Musculoskeletal:  Normal gait. Head normocephalic, atraumatic. Symmetrical without gross deformities. 5/5 Lower extremity strength bilaterally.  Skin: Warm. Intact without significant lesions or rashes. No jaundice.  Neck: Supple, trachea midline  Lymph: No cervical lymphadenopathy  Psych:  Alert and oriented x3, Alert and cooperative. Normal mood and affect.  Labs: CMP     Component Value Date/Time   NA 138 04/08/2021 1218   NA 138 07/31/2013 1727   K 3.8 04/08/2021 1218   K 3.7 07/31/2013 1727   CL 103 04/08/2021 1218   CL 105 07/31/2013 1727   CO2 29 04/08/2021 1218   CO2 31 07/31/2013 1727   GLUCOSE 98 04/08/2021 1218   GLUCOSE 141 (H) 07/31/2013 1727   BUN 19 04/08/2021 1218   BUN 15 07/31/2013 1727   CREATININE 0.83 04/08/2021 1218   CREATININE 0.96 07/31/2013 1727   CALCIUM 9.4 04/08/2021 1218   CALCIUM 9.0 07/31/2013 1727   PROT 7.1  04/04/2021 1029   ALBUMIN 4.3 04/04/2021 1029   AST 22 04/04/2021 1029   ALT 17 04/04/2021 1029   ALKPHOS 60 04/04/2021 1029   BILITOT 0.7 04/04/2021 1029   GFRNONAA >60 02/15/2021 1149   GFRNONAA >60 07/31/2013 1727   GFRAA >60 02/03/2020 0956   GFRAA >60 07/31/2013 1727   Lab Results  Component Value Date   WBC 5.4 02/15/2021   HGB 13.0 02/15/2021   HCT 37.7 02/15/2021   MCV 96.7 02/15/2021   PLT 234 02/15/2021    Imaging  Studies:   Assessment and Plan:   YAMILETTE GARRETSON is a 70 y.o. y/o female here for follow-up and reports continued epigastric pain, nausea, despite increasing PPI to twice daily  Patient was prescribed increased dose of PPI for 28 days and this has not resolved her symptoms Due to ongoing symptoms, would recommend EGD at this time for evaluation of nausea and epigastric pain  We will also check fecal elastase as patient is reporting nausea, and loose stools when she does have a bowel movement, and since she has never had this test before  Colonoscopy up-to-date  She completed twice daily PPI therapy  Since she is still having constipation, will increase Linzess to 290 MCG a day from 145 MCG a day to see if it allows her to have a bowel movement more frequently, once a day or once every other day which may prevent her from having possible overflow diarrhea that she is having 1-2 times a week  If constipation is not better in about 2 weeks, patient advised to call us to consider other treatments.  Trulance and Amitiza are nonpreferred drugs under epic/her insurance and therefore increasing Linzess dose is best option at this time.  May need prior authorization if the other medications need to be prescribed  I have discussed alternative options, risks & benefits,  which include, but are not limited to, bleeding, infection, perforation,respiratory complication & drug reaction.  The patient agrees with this plan & written consent will be obtained.     Dr Valerie [name]

## 2021-05-13 NOTE — Progress Notes (Signed)
Called patient for her blood pressure readings.  Date  Blood Pressure 12/16  101/70 12/15  102/70 12/14  108/65 12/13  107/71    [name], CPP notified  [name], [place] Clinical Pharmacy Assistant [phone]  Time Spent:  5 Minutes

## 2021-05-16 ENCOUNTER — Encounter: Payer: Self-pay | Admitting: Gastroenterology

## 2021-05-16 DIAGNOSIS — R195 Other fecal abnormalities: Secondary | ICD-10-CM | POA: Diagnosis not present

## 2021-05-17 ENCOUNTER — Encounter
Admission: RE | Disposition: A | Discharge status: Home / Self Care | Payer: Self-pay | Source: Home / Self Care | Attending: Gastroenterology

## 2021-05-17 ENCOUNTER — Ambulatory Visit
Admission: RE | Admit: 2021-05-17 | Discharge: 2021-05-17 | Disposition: A | Discharge status: Home / Self Care | Payer: Medicare HMO | Attending: Gastroenterology | Admitting: Gastroenterology

## 2021-05-17 ENCOUNTER — Ambulatory Visit: Payer: Medicare HMO | Admitting: Certified Registered Nurse Anesthetist

## 2021-05-17 ENCOUNTER — Encounter: Payer: Self-pay | Admitting: Gastroenterology

## 2021-05-17 DIAGNOSIS — R112 Nausea with vomiting, unspecified: Secondary | ICD-10-CM | POA: Insufficient documentation

## 2021-05-17 DIAGNOSIS — K319 Disease of stomach and duodenum, unspecified: Secondary | ICD-10-CM | POA: Diagnosis not present

## 2021-05-17 DIAGNOSIS — R110 Nausea: Secondary | ICD-10-CM | POA: Diagnosis not present

## 2021-05-17 DIAGNOSIS — R1013 Epigastric pain: Secondary | ICD-10-CM | POA: Diagnosis not present

## 2021-05-17 DIAGNOSIS — L538 Other specified erythematous conditions: Secondary | ICD-10-CM | POA: Diagnosis not present

## 2021-05-17 DIAGNOSIS — K3189 Other diseases of stomach and duodenum: Secondary | ICD-10-CM | POA: Diagnosis not present

## 2021-05-17 DIAGNOSIS — L539 Erythematous condition, unspecified: Secondary | ICD-10-CM | POA: Diagnosis not present

## 2021-05-17 HISTORY — PX: ESOPHAGOGASTRODUODENOSCOPY (EGD) WITH PROPOFOL: SHX5813

## 2021-05-17 SURGERY — ESOPHAGOGASTRODUODENOSCOPY (EGD) WITH PROPOFOL
Anesthesia: General

## 2021-05-17 MED ORDER — PROPOFOL 500 MG/50ML IV EMUL
INTRAVENOUS | Status: DC | PRN
  Administered 2021-05-17: 150 ug/kg/min via INTRAVENOUS

## 2021-05-17 MED ORDER — LIDOCAINE HCL (CARDIAC) PF 100 MG/5ML IV SOSY
PREFILLED_SYRINGE | INTRAVENOUS | Status: DC | PRN
  Administered 2021-05-17: 100 mg via INTRAVENOUS

## 2021-05-17 MED ORDER — PROPOFOL 10 MG/ML IV BOLUS
INTRAVENOUS | Status: DC | PRN
  Administered 2021-05-17: 70 mg via INTRAVENOUS

## 2021-05-17 MED ORDER — SODIUM CHLORIDE 0.9 % IV SOLN: INTRAVENOUS | Status: DC

## 2021-05-17 NOTE — Op Note (Signed)
[facility] Gastroenterology Patient Name: Valerie [name] Procedure Date: 05/17/2021 11:04 AM MRN: [id number] Account #: [id number] Date of Birth: [birth year] Admit Type: Outpatient Age: 70 Room: [facility] ENDO ROOM 3 Gender: Female Note Status: Finalized Instrument Name: Upper Endoscope [id number] Procedure:             Upper GI endoscopy Indications:           Epigastric abdominal pain, Nausea with vomiting Providers:             Arlen Dupuis B. Bonna Gains MD, MD Medicines:             Monitored Anesthesia Care Complications:         No immediate complications. Procedure:             Pre-Anesthesia Assessment:                        - Prior to the procedure, a History and Physical was                         performed, and patient medications, allergies and                         sensitivities were reviewed. The patient's tolerance                         of previous anesthesia was reviewed.                        - The risks and benefits of the procedure and the                         sedation options and risks were discussed with the                         patient. All questions were answered and informed                         consent was obtained.                        - Patient identification and proposed procedure were                         verified prior to the procedure by the physician, the                         nurse, the anesthesiologist, the anesthetist and the                         technician. The procedure was verified in the                         procedure room.                        - ASA Grade Assessment: II - A patient with mild                         systemic disease.  After obtaining informed consent, the endoscope was                         passed under direct vision. Throughout the procedure,                         the patient's blood pressure, pulse, and oxygen                         saturations were monitored  continuously. The Endoscope                         was introduced through the mouth, and advanced to the                         second part of duodenum. The upper GI endoscopy was                         accomplished with ease. The patient tolerated the                         procedure well. Findings:      The examined esophagus was normal.      Patchy mildly erythematous mucosa without bleeding was found in the       gastric antrum. Biopsies were taken with a cold forceps for histology.       Biopsies were obtained in the gastric body, at the incisura and in the       gastric antrum with cold forceps for histology.      The exam of the stomach was otherwise normal.      The duodenal bulb, second portion of the duodenum and examined duodenum       were normal. Impression:            - Normal esophagus.                        - Erythematous mucosa in the antrum. Biopsied.                        - Normal duodenal bulb, second portion of the duodenum                         and examined duodenum.                        - Biopsies were obtained in the gastric body, at the                         incisura and in the gastric antrum. Recommendation:        - Await pathology results.                        - Discharge patient to home (with escort).                        - Advance diet as tolerated.                        - Continue present medications.                        -  Patient has a contact number available for                         emergencies. The signs and symptoms of potential                         delayed complications were discussed with the patient.                         Return to normal activities tomorrow. Written                         discharge instructions were provided to the patient.                        - Discharge patient to home (with escort).                        - The findings and recommendations were discussed with                         the  patient.                        - The findings and recommendations were discussed with                         the patient's family. Procedure Code(s):     --- Professional ---                        [phone], Esophagogastroduodenoscopy, flexible,                         transoral; with biopsy, single or multiple Diagnosis Code(s):     --- Professional ---                        K31.89, Other diseases of stomach and duodenum                        R10.13, Epigastric pain                        R11.2, Nausea with vomiting, unspecified CPT copyright 2019 American Medical Association. All rights reserved. The codes documented in this report are preliminary and upon coder review may  be revised to meet current compliance requirements.  Vonda Antigua, MD Margretta Sidle B. Bonna Gains MD, MD 05/17/2021 11:37:07 AM This report has been signed electronically. Number of Addenda: 0 Note Initiated On: 05/17/2021 11:04 AM Estimated Blood Loss:  Estimated blood loss: none.      [facility]

## 2021-05-17 NOTE — Transfer of Care (Signed)
Immediate Anesthesia Transfer of Care Note  Patient: Valerie [name]  Procedure(s) Performed: ESOPHAGOGASTRODUODENOSCOPY (EGD) WITH PROPOFOL  Patient Location: PACU  Anesthesia Type:General  Level of Consciousness: drowsy  Airway & Oxygen Therapy: Patient Spontanous Breathing and Patient connected to nasal cannula oxygen  Post-op Assessment: Report given to RN and Post -op Vital signs reviewed and stable  Post vital signs: Reviewed and stable   Last Vitals:  Vitals Value Taken Time  BP    Temp    Pulse 64 05/17/21 1135  Resp 15 05/17/21 1135  SpO2 96 % 05/17/21 1135  Vitals shown include unvalidated device data.  Last Pain:  Vitals:   05/17/21 1040  TempSrc: Temporal         Complications: No notable events documented.

## 2021-05-17 NOTE — Anesthesia Postprocedure Evaluation (Signed)
Anesthesia Post Note  Patient: Valerie [name]  Procedure(s) Performed: ESOPHAGOGASTRODUODENOSCOPY (EGD) WITH PROPOFOL  Patient location during evaluation: PACU Anesthesia Type: General Level of consciousness: awake and awake and alert Pain management: pain level controlled Vital Signs Assessment: post-procedure vital signs reviewed and stable Respiratory status: spontaneous breathing and respiratory function stable Cardiovascular status: stable Anesthetic complications: no   No notable events documented.   Last Vitals:  Vitals:   05/17/21 1145 05/17/21 1155  BP: 117/77 128/79  Pulse: 60 (!) 53  Resp: 17 12  Temp:    SpO2: 96% 99%    Last Pain:  Vitals:   05/17/21 1145  TempSrc:   PainSc: 1                  VAN STAVEREN,Jasneet Schobert

## 2021-05-17 NOTE — Anesthesia Preprocedure Evaluation (Signed)
Anesthesia Evaluation  Patient identified by MRN, date of birth, ID band Patient awake    Reviewed: Allergy & Precautions, NPO status , Patient's Chart, lab work & pertinent test results  History of Anesthesia Complications (+) PONV  Airway Mallampati: II  TM Distance: >3 FB Neck ROM: full    Dental  (+) Upper Dentures, Lower Dentures   Pulmonary neg pulmonary ROS,    Pulmonary exam normal breath sounds clear to auscultation       Cardiovascular Exercise Tolerance: Good hypertension, Pt. on medications negative cardio ROS Normal cardiovascular exam Rhythm:Regular Rate:Normal     Neuro/Psych Anxiety Depression negative neurological ROS  negative psych ROS   GI/Hepatic negative GI ROS, Neg liver ROS, GERD  ,  Endo/Other  negative endocrine ROS  Renal/GU negative Renal ROS  negative genitourinary   Musculoskeletal negative musculoskeletal ROS (+)   Abdominal Normal abdominal exam  (+)   Peds negative pediatric ROS (+)  Hematology negative hematology ROS (+)   Anesthesia Other Findings Past Medical History: No date: Acid reflux 12/04/2000: Adenocarcinoma of the endometrium/uterus ([place]) No date: Anal fissure No date: Atrophic vaginitis No date: Difficult intubation     Comment:  limited neck mobility No date: Esophagitis No date: Hernia     Comment:  Present to proximal to umbillical  No date: History of hiatal hernia No date: Hyperlipidemia No date: Hypertension No date: Internal hemorrhoids No date: Menorrhagia No date: Osteopenia No date: PONV (postoperative nausea and vomiting) No date: Vertigo No date: Vitamin D deficiency  Past Surgical History: 11/29/2015: 24 HOUR [place] STUDY; N/A     Comment:  Procedure: 80 HOUR [place] STUDY;  Surgeon: Manus Gunning, MD;  [facility]: [facility];  Service:               Gastroenterology;  Laterality: N/A; 12/04/2000: ABDOMINAL HYSTERECTOMY      Comment:  also bilateral salpingo-oophorectomy 2006: CARDIAC CATHETERIZATION 11/20/2011: CARPAL TUNNEL RELEASE     Comment:  Procedure: CARPAL TUNNEL RELEASE;  Surgeon: Elaina Hoops,              MD;  Location: [place] [facility];  Service: Neurosurgery;                Laterality: Left;  LEFT carpal tunnel release 2013: CERVICAL SPINE SURGERY     Comment:  [place]: CHOLECYSTECTOMY 07/27/2020: COLONOSCOPY WITH PROPOFOL; N/A     Comment:  Procedure: COLONOSCOPY WITH PROPOFOL;  Surgeon: Lucilla Lame, MD;  Location: [facility] ENDOSCOPY;  Service:               Endoscopy;  Laterality: N/A; No date: DILATION AND CURETTAGE OF UTERUS     Comment:  11/15/2000 09/20/2015: ESOPHAGEAL MANOMETRY; N/A     Comment:  Procedure: ESOPHAGEAL MANOMETRY (EM);  Surgeon: Manus Gunning, MD;  [facility]: [facility];  Service:               Gastroenterology;  Laterality: N/A; 11/15/2000: HYSTEROSCOPY     Comment:  D & C, resection of endometrial polyps 12/04/2000: OOPHORECTOMY     Comment:  bilateral salpingo-oophorectomy done with TAH 04/12/2020: SPINAL CORD STIMULATOR INSERTION; Bilateral     Comment:  Procedure: INSERTION CERVICAL SPINAL STIMULATOR PULSE  GENERATOR;  Surgeon: [name], MD;  Location: [facility]               ORS;  Service: Neurosurgery;  Laterality: Bilateral; 03/22/2020: SPINAL CORD STIMULATOR TRIAL; N/A     Comment:  Procedure: CERVICAL SPINAL CORD STIMULATOR TRIAL               PERCUTANEOUS;  Surgeon: [name], MD;  Location: [facility]              ORS;  Service: Neurosurgery;  Laterality: N/A; No date: TUBAL LIGATION  BMI    Body Mass Index: 24.13 kg/m      Reproductive/Obstetrics negative OB ROS                             Anesthesia Physical Anesthesia Plan  ASA: 2  Anesthesia Plan: General   Post-op Pain Management:    Induction: Intravenous  PONV Risk Score and Plan: 1 and Ondansetron  Airway Management  Planned: Natural Airway and Nasal Cannula  Additional Equipment:   Intra-op Plan:   Post-operative Plan:   Informed Consent: I have reviewed the patients History and Physical, chart, labs and discussed the procedure including the risks, benefits and alternatives for the proposed anesthesia with the patient or authorized representative who has indicated his/her understanding and acceptance.     Dental Advisory Given  Plan Discussed with: CRNA and Surgeon  Anesthesia Plan Comments:         Anesthesia Quick Evaluation

## 2021-05-17 NOTE — H&P (Signed)
Valerie Antigua, MD [address] [place], [place], [place], [address] [address], [place], [place], [place], [address] Phone: [phone]  Fax: [phone]  Primary Care Physician:  Pleas Koch, NP   Pre-Procedure History & Physical: HPI:  Valerie [name] is a 70 y.o. female is here for an EGD.   Past Medical History:  Diagnosis Date   Acid reflux    Adenocarcinoma of the endometrium/uterus ([place]) 12/04/2000   Anal fissure    Atrophic vaginitis    Difficult intubation    limited neck mobility   Esophagitis    Hernia    Present to proximal to umbillical    History of hiatal hernia    Hyperlipidemia    Hypertension    Internal hemorrhoids    Menorrhagia    Osteopenia    PONV (postoperative nausea and vomiting)    Vertigo    Vitamin D deficiency     Past Surgical History:  Procedure Laterality Date   63 HOUR [place] STUDY N/A 11/29/2015   Procedure: 24 HOUR [place] STUDY;  Surgeon: Manus Gunning, MD;  Location: [facility];  Service: Gastroenterology;  Laterality: N/A;   ABDOMINAL HYSTERECTOMY  12/04/2000   also bilateral salpingo-oophorectomy   CARDIAC CATHETERIZATION  2006   CARPAL TUNNEL RELEASE  11/20/2011   Procedure: CARPAL TUNNEL RELEASE;  Surgeon: Elaina Hoops, MD;  Location: [place] [facility];  Service: Neurosurgery;  Laterality: Left;  LEFT carpal tunnel release   CERVICAL SPINE SURGERY  2013   [place]   COLONOSCOPY WITH PROPOFOL N/A 07/27/2020   Procedure: COLONOSCOPY WITH PROPOFOL;  Surgeon: Lucilla Lame, MD;  Location: [facility] ENDOSCOPY;  Service: Endoscopy;  Laterality: N/A;   DILATION AND CURETTAGE OF UTERUS     11/15/2000   ESOPHAGEAL MANOMETRY N/A 09/20/2015   Procedure: ESOPHAGEAL MANOMETRY (EM);  Surgeon: Manus Gunning, MD;  [facility]: [facility];  Service: Gastroenterology;  Laterality: N/A;   HYSTEROSCOPY  11/15/2000   D & C, resection of endometrial polyps   OOPHORECTOMY  12/04/2000   bilateral  salpingo-oophorectomy done with TAH   SPINAL CORD STIMULATOR INSERTION Bilateral 04/12/2020   Procedure: INSERTION CERVICAL SPINAL STIMULATOR PULSE GENERATOR;  Surgeon: [name], MD;  Location: [facility] ORS;  Service: Neurosurgery;  Laterality: Bilateral;   SPINAL CORD STIMULATOR TRIAL N/A 03/22/2020   Procedure: CERVICAL SPINAL CORD STIMULATOR TRIAL PERCUTANEOUS;  Surgeon: [name], MD;  Location: [facility] ORS;  Service: Neurosurgery;  Laterality: N/A;   TUBAL LIGATION      Prior to Admission medications   Medication Sig Start Date End Date Taking? Authorizing Provider  amLODipine (NORVASC) 5 MG tablet Take 1 tablet (5 mg total) by mouth daily. For blood pressure. 04/27/21  Yes Pleas Koch, NP  atorvastatin (LIPITOR) 20 MG tablet Take 20 mg by mouth daily. 12/15/20 12/15/21 Yes [provider]  DULoxetine (CYMBALTA) 20 MG capsule Take 1 capsule (20 mg total) by mouth daily. For anxiety/depression/pain 08/19/20  Yes Pleas Koch, NP  metoprolol succinate (TOPROL-XL) 50 MG 24 hr tablet Take 1 tablet (50 mg total) by mouth daily. For heart rate/palpitations. 08/12/20  Yes Pleas Koch, NP  valsartan (DIOVAN) 160 MG tablet Take 160 mg by mouth daily.   Yes [provider]  acetaminophen (TYLENOL) 500 MG tablet Take 500-1,000 mg by mouth every 6 (six) hours as needed (for pain).    [provider]  Calcium Carbonate-Vitamin D (CALCIUM-VITAMIN D) 600-3.125 MG-MCG TABS Take by mouth.  [provider]  estradiol (ESTRACE) 0.1 MG/GM vaginal cream Estrogen Cream Instruction Discard applicator Apply pea sized amount to tip of finger to urethra before bed. Wash hands well after application. Use Monday, Wednesday and Friday 12/16/20   Billey Co, MD  linaclotide [facility]) 290 MCG CAPS capsule Take 1 capsule (290 mcg total) by mouth daily. 05/12/21 08/10/21  Virgel Manifold, MD  omeprazole (PRILOSEC) 40 MG capsule Take 1 capsule (40 mg total) by  mouth 2 (two) times daily for 28 days. For heartburn. 04/04/21 05/02/21  Virgel Manifold, MD  ondansetron (ZOFRAN) 4 MG tablet Take 1 tablet (4 mg total) by mouth every 6 (six) hours as needed for nausea. 01/28/21   Loletha Grayer, MD    Allergies as of 05/12/2021   (No Known Allergies)    Family History  Problem Relation Age of Onset   Hypertension Father    Prostate cancer Father    Hypertension Sister    Lung cancer Sister    Diabetes Brother    Heart disease Brother    Hypertension Brother    Diabetes Brother    Heart disease Brother    Breast cancer Neg Hx     Social History   Socioeconomic History   Marital status: Married    Spouse name: Not on file   Number of children: Not on file   Years of education: Not on file   Highest education level: Not on file  Occupational History   Not on file  Tobacco Use   Smoking status: Never   Smokeless tobacco: Never  Vaping Use   Vaping Use: Never used  Substance and Sexual Activity   Alcohol use: No   Drug use: Never   Sexual activity: Yes    Birth control/protection: Post-menopausal, Surgical  Other Topics Concern   Not on file  Social History Narrative   Married   Lives in [place]   Has 2 children, 4 grandchildren   Enjoys walking, shopping.   Social Determinants of Health   Financial Resource Strain: Not on file  Food Insecurity: Not on file  Transportation Needs: Not on file  Physical Activity: Not on file  Stress: Not on file  Social Connections: Not on file  Intimate Partner Violence: Not on file    Review of Systems: See HPI, otherwise negative ROS  Constitutional: General:   Alert,  Well-developed, well-nourished, pleasant and cooperative in NAD BP (!) 151/92    Pulse 68    Temp (!) 96.7 F (35.9 C) (Temporal)    Resp 16    Ht 5\' 5"  (1.651 m)    Wt 65.8 kg    SpO2 100%    BMI 24.13 kg/m   Head: Normocephalic, atraumatic.   Eyes:  Sclera clear, no icterus.   Conjunctiva pink.   Mouth:  No  deformity or lesions, oropharynx pink & moist.  Neck:  Supple, trachea midline  Respiratory: Normal respiratory effort  Gastrointestinal:  Soft, non-tender and non-distended without masses, hepatosplenomegaly or hernias noted.  No guarding or rebound tenderness.     Cardiac: No clubbing or edema.  No cyanosis. Normal posterior tibial pedal pulses noted.  Lymphatic:  No significant cervical adenopathy.  Psych:  Alert and cooperative. Normal mood and affect.  Musculoskeletal:   Symmetrical without gross deformities. 5/5 Lower extremity strength bilaterally.  Skin: Warm. Intact without significant lesions or rashes. No jaundice.  Neurologic:  Face symmetrical, tongue midline, Normal sensation to touch;  grossly normal neurologically.  Psych:  Alert and oriented x3, Alert and cooperative. Normal mood and affect.  Impression/Plan: Valerie [name] is here for an EGD for epigastric pain, nausea.  Risks, benefits, limitations, and alternatives regarding the procedure have been reviewed with the patient.  Questions have been answered.  All parties agreeable.   Virgel Manifold, MD  05/17/2021, 11:19 AM

## 2021-05-18 ENCOUNTER — Encounter: Payer: Self-pay | Admitting: Gastroenterology

## 2021-05-18 LAB — PANCREATIC ELASTASE, FECAL: Pancreatic Elastase, Fecal: 354 ug Elast./g (ref >200)

## 2021-05-18 LAB — SURGICAL PATHOLOGY

## 2021-05-25 ENCOUNTER — Encounter: Payer: Self-pay | Admitting: Gastroenterology

## 2021-06-01 ENCOUNTER — Telehealth: Payer: Self-pay

## 2021-06-01 NOTE — Telephone Encounter (Signed)
Pt called to report that her N/V has worsened since EGD and she has been taking Linzess 216mcg with minimal relief and is averaging 1 BM per week.... Pt would like to know what she needs to do going forward.... Please advise

## 2021-06-05 ENCOUNTER — Other Ambulatory Visit: Payer: Self-pay | Admitting: Primary Care

## 2021-06-05 DIAGNOSIS — I10 Essential (primary) hypertension: Secondary | ICD-10-CM

## 2021-06-06 ENCOUNTER — Other Ambulatory Visit: Payer: Self-pay | Admitting: Primary Care

## 2021-06-06 DIAGNOSIS — F411 Generalized anxiety disorder: Secondary | ICD-10-CM

## 2021-06-07 NOTE — Telephone Encounter (Signed)
Left message on voicemail    Virgel Manifold, MD to Me   12:50 PM  Change linzess to trulance 3 mg a day   Based on her previous extensive workup her symptoms likely have a functional component and she was on desipramine for this previously with unclear results.   If she is having abdominal cramping, bloating as well, she can try peppermint oil or IBGard as well   F/u in 4-6 weeks to reassess symptoms

## 2021-06-07 NOTE — Addendum Note (Signed)
Addended by: Lurlean Nanny on: 06/07/2021 04:39 PM   Modules accepted: Orders

## 2021-06-13 DIAGNOSIS — R002 Palpitations: Secondary | ICD-10-CM | POA: Diagnosis not present

## 2021-06-13 DIAGNOSIS — E782 Mixed hyperlipidemia: Secondary | ICD-10-CM | POA: Diagnosis not present

## 2021-06-13 DIAGNOSIS — I10 Essential (primary) hypertension: Secondary | ICD-10-CM | POA: Diagnosis not present

## 2021-06-13 DIAGNOSIS — I700 Atherosclerosis of aorta: Secondary | ICD-10-CM | POA: Diagnosis not present

## 2021-06-13 DIAGNOSIS — R000 Tachycardia, unspecified: Secondary | ICD-10-CM | POA: Diagnosis not present

## 2021-06-14 ENCOUNTER — Other Ambulatory Visit: Payer: Self-pay | Admitting: Primary Care

## 2021-06-14 DIAGNOSIS — I10 Essential (primary) hypertension: Secondary | ICD-10-CM

## 2021-06-20 NOTE — Progress Notes (Signed)
Subjective:   Valerie [name] is a 71 y.o. female who presents for Medicare Annual (Subsequent) preventive examination.  I connected with Marion Downer today by telephone and verified that I am speaking with the correct person using two identifiers. Location patient: home Location provider: work Persons participating in the virtual visit: patient, Marine scientist.    I discussed the limitations, risks, security and privacy concerns of performing an evaluation and management service by telephone and the availability of in person appointments. I also discussed with the patient that there may be a patient responsible charge related to this service. The patient expressed understanding and verbally consented to this telephonic visit.    Interactive audio and video telecommunications were attempted between this provider and patient, however failed, due to patient having technical difficulties OR patient did not have access to video capability.  We continued and completed visit with audio only.  Some vital signs may be absent or patient reported.   Time Spent with patient on telephone encounter: 25 minutes  Review of Systems     Cardiac Risk Factors include: advanced age (>81men, >53 women);hypertension;dyslipidemia     Objective:    Today's Vitals   06/21/21 1505 06/21/21 1506  Weight: 145 lb (65.8 kg)   Height: 5\' 5"  (1.651 m)   PainSc:  4    Body mass index is 24.13 kg/m.  Advanced Directives 06/21/2021 05/17/2021 02/15/2021 01/25/2021 11/30/2020 07/27/2020 04/12/2020  Does Patient Have a Medical Advance Directive? No No No No No No No  Would patient like information on creating a medical advance directive? Yes (MAU/Ambulatory/Procedural Areas - Information given) No - Patient declined No - Patient declined No - Patient declined - No - Patient declined No - Patient declined    Current Medications (verified) Outpatient Encounter Medications as of 06/21/2021  Medication Sig   acetaminophen (TYLENOL)  500 MG tablet Take 500-1,000 mg by mouth every 6 (six) hours as needed (for pain).   amLODipine (NORVASC) 5 MG tablet Take 1 tablet (5 mg total) by mouth daily. For blood pressure.   atorvastatin (LIPITOR) 20 MG tablet Take 20 mg by mouth daily.   Calcium Carbonate-Vitamin D (CALCIUM-VITAMIN D) 600-3.125 MG-MCG TABS Take by mouth.   DULoxetine (CYMBALTA) 20 MG capsule Take 1 capsule (20 mg total) by mouth daily. For anxiety/depression/pain   estradiol (ESTRACE) 0.1 MG/GM vaginal cream Estrogen Cream Instruction Discard applicator Apply pea sized amount to tip of finger to urethra before bed. Wash hands well after application. Use Monday, Wednesday and Friday   metoprolol succinate (TOPROL-XL) 50 MG 24 hr tablet TAKE 1 TABLET DAILY FOR HEART RATE/PALPITATIONS   ondansetron (ZOFRAN) 4 MG tablet Take 1 tablet (4 mg total) by mouth every 6 (six) hours as needed for nausea.   valsartan (DIOVAN) 160 MG tablet Take 160 mg by mouth daily.   omeprazole (PRILOSEC) 40 MG capsule Take 1 capsule (40 mg total) by mouth 2 (two) times daily for 28 days. For heartburn.   No facility-administered encounter medications on file as of 06/21/2021.    Allergies (verified) Patient has no known allergies.   History: Past Medical History:  Diagnosis Date   Acid reflux    Adenocarcinoma of the endometrium/uterus ([place]) 12/04/2000   Anal fissure    Atrophic vaginitis    Difficult intubation    limited neck mobility   Esophagitis    Hernia    Present to proximal to umbillical    History of hiatal hernia    Hyperlipidemia  Hypertension    Internal hemorrhoids    Menorrhagia    Osteopenia    PONV (postoperative nausea and vomiting)    Vertigo    Vitamin D deficiency    Past Surgical History:  Procedure Laterality Date   90 HOUR [place] STUDY N/A 11/29/2015   Procedure: 24 HOUR [place] STUDY;  Surgeon: Manus Gunning, MD;  [facility];  Service: Gastroenterology;  Laterality: N/A;   ABDOMINAL  HYSTERECTOMY  12/04/2000   also bilateral salpingo-oophorectomy   CARDIAC CATHETERIZATION  2006   CARPAL TUNNEL RELEASE  11/20/2011   Procedure: CARPAL TUNNEL RELEASE;  Surgeon: Elaina Hoops, MD;  Location: [place] [facility] OR[facility];  Service: Neurosurgery;  Laterality: Left;  LEFT carpal tunnel release   CERVICAL SPINE SURGERY  2013   [place]   COLONOSCOPY WITH PROPOFOL N/A 07/27/2020   Procedure: COLONOSCOPY WITH PROPOFOL;  Surgeon: Lucilla Lame, MD;  Location: [facility] ENDOSCOPY;  Service: Endoscopy;  Laterality: N/A;   DILATION AND CURETTAGE OF UTERUS     11/15/2000   ESOPHAGEAL MANOMETRY N/A 09/20/2015   Procedure: ESOPHAGEAL MANOMETRY (EM);  Surgeon: Manus Gunning, MD;  [facility]: [facility];  Service: Gastroenterology;  Laterality: N/A;   ESOPHAGOGASTRODUODENOSCOPY (EGD) WITH PROPOFOL N/A 05/17/2021   Procedure: ESOPHAGOGASTRODUODENOSCOPY (EGD) WITH PROPOFOL;  Surgeon: Virgel Manifold, MD;  Location: [facility] ENDOSCOPY;  Service: Endoscopy;  Laterality: N/A;   HYSTEROSCOPY  11/15/2000   D & C, resection of endometrial polyps   OOPHORECTOMY  12/04/2000   bilateral salpingo-oophorectomy done with TAH   SPINAL CORD STIMULATOR INSERTION Bilateral 04/12/2020   Procedure: INSERTION CERVICAL SPINAL STIMULATOR PULSE GENERATOR;  Surgeon: [name], MD;  Location: [facility] ORS;  Service: Neurosurgery;  Laterality: Bilateral;   SPINAL CORD STIMULATOR TRIAL N/A 03/22/2020   Procedure: CERVICAL SPINAL CORD STIMULATOR TRIAL PERCUTANEOUS;  Surgeon: [name], MD;  Location: [facility] ORS;  Service: Neurosurgery;  Laterality: N/A;   TUBAL LIGATION     Family History  Problem Relation Age of Onset   Hypertension Father    Prostate cancer Father    Hypertension Sister    Lung cancer Sister    Diabetes Brother    Heart disease Brother    Hypertension Brother    Diabetes Brother    Heart disease Brother    Breast cancer Neg Hx    Social History   Socioeconomic History   Marital  status: Married    Spouse name: Not on file   Number of children: Not on file   Years of education: Not on file   Highest education level: Not on file  Occupational History   Not on file  Tobacco Use   Smoking status: Never   Smokeless tobacco: Never  Vaping Use   Vaping Use: Never used  Substance and Sexual Activity   Alcohol use: No   Drug use: Never   Sexual activity: Yes    Birth control/protection: Post-menopausal, Surgical  Other Topics Concern   Not on file  Social History Narrative   Married   Lives in [place]   Has 2 children, 4 grandchildren   Enjoys walking, shopping.   Social Determinants of Health   Financial Resource Strain: Low Risk    Difficulty of Paying Living Expenses: Not hard at all  Food Insecurity: No Food Insecurity   Worried About Charity fundraiser in the Last Year: Never true   Ran Out of Food in the Last Year: Never true  Transportation Needs: No Transportation  Needs   Lack of Transportation (Medical): No   Lack of Transportation (Non-Medical): No  Physical Activity: Sufficiently Active   Days of Exercise per Week: 7 days   Minutes of Exercise per Session: 30 min  Stress: No Stress Concern Present   Feeling of Stress : Not at all  Social Connections: Moderately Integrated   Frequency of Communication with Friends and Family: More than three times a week   Frequency of Social Gatherings with Friends and Family: Three times a week   Attends Religious Services: More than 4 times per year   Active Member of Clubs or Organizations: No   Attends Archivist Meetings: Never   Marital Status: Married    Tobacco Counseling Counseling given: Not Answered   Clinical Intake:  Pre-visit preparation completed: Yes  Pain : 0-10 Pain Score: 4  Pain Location: Back     BMI - recorded: 24.13 Nutritional Status: BMI of 19-24  Normal Nutritional Risks: None Diabetes: No  How often do you need to have someone help you when you read  instructions, pamphlets, or other written materials from your doctor or pharmacy?: 1 - Never  Diabetic? No  Interpreter Needed?: No  Information entered by :: Orrin Brigham LPN   Activities of Daily Living In your present state of health, do you have any difficulty performing the following activities: 06/21/2021 01/26/2021  Hearing? N N  Vision? N N  Difficulty concentrating or making decisions? N N  Walking or climbing stairs? N N  Dressing or bathing? N N  Doing errands, shopping? N N  Preparing Food and eating ? N -  Using the Toilet? N -  In the past six months, have you accidently leaked urine? Y -  Comment with urgency -  Do you have problems with loss of bowel control? N -  Managing your Medications? N -  Managing your Finances? N -  Housekeeping or managing your Housekeeping? N -  Some recent data might be hidden    Patient Care Team: Pleas Koch, NP as PCP - General (Nurse Practitioner) Charlton Haws, [facility] as Pharmacist (Pharmacist)  Indicate any recent Medical Services you may have received from other than [facility] providers in the past year (date may be approximate).     Assessment:   This is a routine wellness examination for [place].  Hearing/Vision screen Hearing Screening - Comments:: Decrease in hearing  Vision Screening - Comments:: Last exam over a year ago, plans to make an appointment  Dietary issues and exercise activities discussed: Current Exercise Habits: Home exercise routine, Type of exercise: walking, Time (Minutes): 30, Frequency (Times/Week): 7, Weekly Exercise (Minutes/Week): 210, Intensity: Moderate   Goals Addressed             This Visit's Progress    Patient Stated       Would like to exercise more and drink more water.       Depression Screen PHQ 2/9 Scores 06/21/2021 06/29/2020 10/21/2019 02/11/2018 01/01/2018 12/26/2016 11/16/2016  PHQ - 2 Score 0 3 0 0 0 0 1  PHQ- 9 Score - 12 0 - 0 0 -    Fall Risk Fall Risk   06/21/2021 12/08/2020 10/21/2019 02/11/2018  Falls in the past year? 0 0 0 No  Number falls in past yr: 0 0 0 -  Injury with Fall? 0 0 0 -  Risk for fall due to : No Fall Risks - Medication side effect -  Follow up Falls prevention discussed -  Falls evaluation completed;Falls prevention discussed -    FALL RISK PREVENTION PERTAINING TO THE HOME:  Any stairs in or around the home? Yes  If so, are there any without handrails? No  Home free of loose throw rugs in walkways, pet beds, electrical cords, etc? Yes  Adequate lighting in your home to reduce risk of falls? Yes   ASSISTIVE DEVICES UTILIZED TO PREVENT FALLS:  Life alert? No  Use of a cane, walker or w/c? No  Grab bars in the bathroom? No  Shower chair or bench in shower? No  Elevated toilet seat or a handicapped toilet? No   TIMED UP AND GO:  Was the test performed? No .    Cognitive Function: Normal cognitive status assessed by  this Nurse Health Advisor. No abnormalities found.   MMSE - Mini Mental State Exam 10/21/2019  Orientation to time 5  Orientation to Place 5  Registration 3  Attention/ Calculation 5  Recall 3  Language- repeat 1        Immunizations Immunization History  Administered Date(s) Administered   Fluad Quad(high Dose 65+) 02/14/2019, 02/23/2021   Hepatitis B 11/25/2015, 05/29/2016   Hepatitis B, adult 09/22/2015   Influenza, High Dose Seasonal PF 05/29/2016   Influenza,inj,Quad PF,6+ Mos 02/11/2018   Influenza-Unspecified 02/27/2015, 04/19/2020   PFIZER(Purple Top)SARS-COV-2 Vaccination 06/20/2019, 07/11/2019, 02/26/2020, 01/04/2021   PNEUMOCOCCAL CONJUGATE-20 03/12/2021   Td 05/29/1997   Tdap 10/09/2014, 09/22/2015   Zoster Recombinat (Shingrix) 01/04/2021, 03/12/2021   Zoster, Live 07/28/2015    TDAP status: Up to date  Flu Vaccine status: Up to date  Pneumococcal vaccine status: Up to date  Covid-19 vaccine status: Information provided on how to obtain vaccines.   Qualifies for  Shingles Vaccine? Yes   Zostavax completed Yes   Shingrix Completed?: Yes  Screening Tests Health Maintenance  Topic Date Due   COVID-19 Vaccine (5 - Booster for Pfizer series) 03/01/2021   MAMMOGRAM  01/11/2023   TETANUS/TDAP  09/21/2025   COLONOSCOPY (Pts 45-67yrs Insurance coverage will need to be confirmed)  07/28/2030   Pneumonia Vaccine 33+ Years old  Completed   INFLUENZA VACCINE  Completed   Hepatitis C Screening  Completed   Zoster Vaccines- Shingrix  Completed   HPV VACCINES  Aged Out    Health Maintenance  Health Maintenance Due  Topic Date Due   COVID-19 Vaccine (5 - Booster for [place] series) 03/01/2021    Colorectal cancer screening: Type of screening: Colonoscopy. Completed 07/27/20. Repeat every 10 years  Mammogram status: Completed 01/10/21. Repeat every year  Bone Density status: Completed 01/10/21. Results reflect: Bone density results: OSTEOPENIA. Repeat every 2 years.  Lung Cancer Screening: (Low Dose CT Chest recommended if Age 34-80 years, 30 pack-year currently smoking OR have quit w/in 15years.) does not qualify.     Additional Screening:  Hepatitis C Screening: does qualify; Completed 11/15/15  Vision Screening: Recommended annual ophthalmology exams for early detection of glaucoma and other disorders of the eye. Is the patient up to date with their annual eye exam?  No  Who is the provider or what is the name of the office in which the patient attends annual eye exams? Provider information unavailable    Dental Screening: Recommended annual dental exams for proper oral hygiene  Community Resource Referral / Chronic Care Management: CRR required this visit?  No   CCM required this visit?  No      Plan:     I have personally reviewed and noted the following  in the patients chart:   Medical and social history Use of alcohol, tobacco or illicit drugs  Current medications and supplements including opioid prescriptions.  Functional ability  and status Nutritional status Physical activity Advanced directives List of other physicians Hospitalizations, surgeries, and ER visits in previous 12 months Vitals Screenings to include cognitive, depression, and falls Referrals and appointments  In addition, I have reviewed and discussed with patient certain preventive protocols, quality metrics, and best practice recommendations. A written personalized care plan for preventive services as well as general preventive health recommendations were provided to patient.   Due to this being a telephonic visit, the after visit summary with patients personalized plan was offered to patient via mail or my-chart. Patient would like to access on my-chart.   Loma Messing, LPN   1/[id number]/4373   Nurse Health Advisor  Nurse Notes: none

## 2021-06-21 ENCOUNTER — Ambulatory Visit (INDEPENDENT_AMBULATORY_CARE_PROVIDER_SITE_OTHER): Payer: Medicare HMO

## 2021-06-21 VITALS — Ht 65.0 in | Wt 145.0 lb

## 2021-06-21 DIAGNOSIS — Z0000 Encounter for general adult medical examination without abnormal findings: Secondary | ICD-10-CM

## 2021-06-21 NOTE — Patient Instructions (Signed)
Ms. Valerie [name] , Thank you for taking time to complete your Medicare Wellness Visit. I appreciate your ongoing commitment to your health goals. Please review the following plan we discussed and let me know if I can assist you in the future.   Screening recommendations/referrals: Colonoscopy: up to date, completed 07/27/20, due 07/28/30 Mammogram: up to date, completed 01/10/21, due 01/10/22 Bone Density: up to date, completed 01/10/21, due 01/11/23 Recommended yearly ophthalmology/optometry visit for glaucoma screening and checkup Recommended yearly dental visit for hygiene and checkup  Vaccinations: Influenza vaccine: up to date Pneumococcal vaccine: up to date Tdap vaccine: up to date, completed 09/22/15, due 09/21/25 Shingles vaccine: up to date, completed    Covid-19:newest booster available at your local pharmacy  Advanced directives: information available at your next visit  Conditions/risks identified: see problem list   Next appointment: Follow up in one year for your annual wellness visit 06/23/22 @ 2:45pm, this will be a telephone visit.    Preventive Care 69 Years and Older, Female Preventive care refers to lifestyle choices and visits with your health care provider that can promote health and wellness. What does preventive care include? A yearly physical exam. This is also called an annual well check. Dental exams once or twice a year. Routine eye exams. Ask your health care provider how often you should have your eyes checked. Personal lifestyle choices, including: Daily care of your teeth and gums. Regular physical activity. Eating a healthy diet. Avoiding tobacco and drug use. Limiting alcohol use. Practicing safe sex. Taking low-dose aspirin every day. Taking vitamin and mineral supplements as recommended by your health care provider. What happens during an annual well check? The services and screenings done by your health care provider during your annual well check will depend  on your age, overall health, lifestyle risk factors, and family history of disease. Counseling  Your health care provider may ask you questions about your: Alcohol use. Tobacco use. Drug use. Emotional well-being. Home and relationship well-being. Sexual activity. Eating habits. History of falls. Memory and ability to understand (cognition). Work and work Statistician. Reproductive health. Screening  You may have the following tests or measurements: Height, weight, and BMI. Blood pressure. Lipid and cholesterol levels. These may be checked every 5 years, or more frequently if you are over 49 years old. Skin check. Lung cancer screening. You may have this screening every year starting at age 78 if you have a 30-pack-year history of smoking and currently smoke or have quit within the past 15 years. Fecal occult blood test (FOBT) of the stool. You may have this test every year starting at age 65. Flexible sigmoidoscopy or colonoscopy. You may have a sigmoidoscopy every 5 years or a colonoscopy every 10 years starting at age 34. Hepatitis C blood test. Hepatitis B blood test. Sexually transmitted disease (STD) testing. Diabetes screening. This is done by checking your blood sugar (glucose) after you have not eaten for a while (fasting). You may have this done every 1-3 years. Bone density scan. This is done to screen for osteoporosis. You may have this done starting at age 9. Mammogram. This may be done every 1-2 years. Talk to your health care provider about how often you should have regular mammograms. Talk with your health care provider about your test results, treatment options, and if necessary, the need for more tests. Vaccines  Your health care provider may recommend certain vaccines, such as: Influenza vaccine. This is recommended every year. Tetanus, diphtheria, and acellular pertussis (Tdap, Td)  vaccine. You may need a Td booster every 10 years. Zoster vaccine. You may need  this after age 46. Pneumococcal 13-valent conjugate (PCV13) vaccine. One dose is recommended after age 59. Pneumococcal polysaccharide (PPSV23) vaccine. One dose is recommended after age 80. Talk to your health care provider about which screenings and vaccines you need and how often you need them. This information is not intended to replace advice given to you by your health care provider. Make sure you discuss any questions you have with your health care provider. Document Released: 06/11/2015 Document Revised: 02/02/2016 Document Reviewed: 03/16/2015 Elsevier Interactive Patient Education  2017 [place] Prevention in the Home Falls can cause injuries. They can happen to people of all ages. There are many things you can do to make your home safe and to help prevent falls. What can I do on the outside of my home? Regularly fix the edges of walkways and driveways and fix any cracks. Remove anything that might make you trip as you walk through a door, such as a raised step or threshold. Trim any bushes or trees on the path to your home. Use bright outdoor lighting. Clear any walking paths of anything that might make someone trip, such as rocks or tools. Regularly check to see if handrails are loose or broken. Make sure that both sides of any steps have handrails. Any raised decks and porches should have guardrails on the edges. Have any leaves, snow, or ice cleared regularly. Use sand or salt on walking paths during winter. Clean up any spills in your garage right away. This includes oil or grease spills. What can I do in the bathroom? Use night lights. Install grab bars by the toilet and in the tub and shower. Do not use towel bars as grab bars. Use non-skid mats or decals in the tub or shower. If you need to sit down in the shower, use a plastic, non-slip stool. Keep the floor dry. Clean up any water that spills on the floor as soon as it happens. Remove soap buildup in the tub  or shower regularly. Attach bath mats securely with double-sided non-slip rug tape. Do not have throw rugs and other things on the floor that can make you trip. What can I do in the bedroom? Use night lights. Make sure that you have a light by your bed that is easy to reach. Do not use any sheets or blankets that are too big for your bed. They should not hang down onto the floor. Have a firm chair that has side arms. You can use this for support while you get dressed. Do not have throw rugs and other things on the floor that can make you trip. What can I do in the kitchen? Clean up any spills right away. Avoid walking on wet floors. Keep items that you use a lot in easy-to-reach places. If you need to reach something above you, use a strong step stool that has a grab bar. Keep electrical cords out of the way. Do not use floor polish or wax that makes floors slippery. If you must use wax, use non-skid floor wax. Do not have throw rugs and other things on the floor that can make you trip. What can I do with my stairs? Do not leave any items on the stairs. Make sure that there are handrails on both sides of the stairs and use them. Fix handrails that are broken or loose. Make sure that handrails are as long  as the stairways. Check any carpeting to make sure that it is firmly attached to the stairs. Fix any carpet that is loose or worn. Avoid having throw rugs at the top or bottom of the stairs. If you do have throw rugs, attach them to the floor with carpet tape. Make sure that you have a light switch at the top of the stairs and the bottom of the stairs. If you do not have them, ask someone to add them for you. What else can I do to help prevent falls? Wear shoes that: Do not have high heels. Have rubber bottoms. Are comfortable and fit you well. Are closed at the toe. Do not wear sandals. If you use a stepladder: Make sure that it is fully opened. Do not climb a closed stepladder. Make  sure that both sides of the stepladder are locked into place. Ask someone to hold it for you, if possible. Clearly mark and make sure that you can see: Any grab bars or handrails. First and last steps. Where the edge of each step is. Use tools that help you move around (mobility aids) if they are needed. These include: Canes. Walkers. Scooters. Crutches. Turn on the lights when you go into a dark area. Replace any light bulbs as soon as they burn out. Set up your furniture so you have a clear path. Avoid moving your furniture around. If any of your floors are uneven, fix them. If there are any pets around you, be aware of where they are. Review your medicines with your doctor. Some medicines can make you feel dizzy. This can increase your chance of falling. Ask your doctor what other things that you can do to help prevent falls. This information is not intended to replace advice given to you by your health care provider. Make sure you discuss any questions you have with your health care provider. Document Released: 03/11/2009 Document Revised: 10/21/2015 Document Reviewed: 06/19/2014 Elsevier Interactive Patient Education  2017 [employer].

## 2021-06-27 ENCOUNTER — Telehealth: Payer: Self-pay | Admitting: Primary Care

## 2021-06-27 ENCOUNTER — Other Ambulatory Visit: Payer: Self-pay

## 2021-06-27 ENCOUNTER — Encounter: Payer: Self-pay | Admitting: Primary Care

## 2021-06-27 ENCOUNTER — Ambulatory Visit (INDEPENDENT_AMBULATORY_CARE_PROVIDER_SITE_OTHER): Payer: Medicare HMO | Admitting: Primary Care

## 2021-06-27 VITALS — BP 126/80 | HR 78 | Temp 97.6°F | Ht 65.0 in | Wt 150.0 lb

## 2021-06-27 DIAGNOSIS — E785 Hyperlipidemia, unspecified: Secondary | ICD-10-CM | POA: Diagnosis not present

## 2021-06-27 DIAGNOSIS — I10 Essential (primary) hypertension: Secondary | ICD-10-CM

## 2021-06-27 DIAGNOSIS — R3129 Other microscopic hematuria: Secondary | ICD-10-CM

## 2021-06-27 DIAGNOSIS — K5909 Other constipation: Secondary | ICD-10-CM | POA: Diagnosis not present

## 2021-06-27 DIAGNOSIS — H8110 Benign paroxysmal vertigo, unspecified ear: Secondary | ICD-10-CM | POA: Diagnosis not present

## 2021-06-27 DIAGNOSIS — R110 Nausea: Secondary | ICD-10-CM | POA: Diagnosis not present

## 2021-06-27 DIAGNOSIS — K219 Gastro-esophageal reflux disease without esophagitis: Secondary | ICD-10-CM

## 2021-06-27 DIAGNOSIS — R7303 Prediabetes: Secondary | ICD-10-CM

## 2021-06-27 DIAGNOSIS — M545 Low back pain, unspecified: Secondary | ICD-10-CM

## 2021-06-27 DIAGNOSIS — M8580 Other specified disorders of bone density and structure, unspecified site: Secondary | ICD-10-CM

## 2021-06-27 DIAGNOSIS — F411 Generalized anxiety disorder: Secondary | ICD-10-CM

## 2021-06-27 DIAGNOSIS — G8929 Other chronic pain: Secondary | ICD-10-CM

## 2021-06-27 DIAGNOSIS — Z0001 Encounter for general adult medical examination with abnormal findings: Secondary | ICD-10-CM

## 2021-06-27 LAB — LIPASE: Lipase: 43 U/L (ref 11.0–59.0)

## 2021-06-27 LAB — LIPID PANEL
Cholesterol: 136 mg/dL (ref 0–200)
HDL: 40.6 mg/dL (ref >39.00)
LDL Cholesterol: 76 mg/dL (ref 0–99)
NonHDL: 95.43
Total CHOL/HDL Ratio: 3
Triglycerides: 97 mg/dL (ref 0.0–149.0)
VLDL: 19.4 mg/dL (ref 0.0–40.0)

## 2021-06-27 LAB — HEMOGLOBIN A1C: Hgb A1c MFr Bld: 5.6 % (ref 4.6–6.5)

## 2021-06-27 MED ORDER — DULOXETINE HCL 20 MG PO CPEP: 40.0000 mg | ORAL_CAPSULE | Freq: Every day | ORAL | 0 refills | Status: DC

## 2021-06-27 NOTE — Progress Notes (Signed)
Subjective:    Patient ID: Valerie [name], female    DOB: 1951/05/27, 71 y.o.   MRN: [id number]  HPI  Valerie [name] is a very pleasant 71 y.o. female who presents today for complete physical and follow up of chronic conditions.  She would also like to discuss nausea. Nausea occurs daily, some days worse than others. She's mentioned this to GI on several occasions, underwent an upper endoscopy in December 2022 which was negative for infection and showed normal esophagus. She is compliant to omeprazole 40 mg daily, has tried taking twice daily without improvement. She is able to eat and drink for the most part. Sometimes will vomit which makes her feel better.  She would also like to discuss anxiety/depression. Symptoms include feeling anxious, restless/fidgety, significant anxiety in the car. Currently managed on Cymbalta 20 mg for anxiety and depression, has been at this dose for years. She has been compliant daily.   She would also like to discuss left lower extremity pain. Chronic, mostly to left, sometimes to right side. Also with chronic back pain history, lower back, bilateral. Her lower extremity pain begins to the left thigh with radiation down to her toes. She mostly notices when laying down, sitting, walking. She denies numbness/tingling, loss of bowel/bladder control, weakness.  She does walk everyday, her pain does affect her walking.  Immunizations: -Tetanus: 2017 -Influenza: Completed this season  -Covid-19: 3 vaccines -Shingles: Completed Zostavax -Pneumonia: Completed at [facility] per patient  Diet: Fair diet.  Exercise: No regular exercise.  Eye exam: Completes annually  Dental exam: Completes semi-annually   Mammogram: Completed in August 2022 Dexa: Completed in 2022 Colonoscopy: Completed in 2022, due 2032  BP Readings from Last 3 Encounters:  06/27/21 126/80  05/17/21 128/79  05/12/21 124/82       Review of Systems  Constitutional:  Negative for unexpected  weight change.  HENT:  Negative for rhinorrhea.   Respiratory:  Negative for cough and shortness of breath.   Cardiovascular:  Negative for chest pain.  Gastrointestinal:  Positive for abdominal pain, constipation and nausea. Negative for diarrhea.  Genitourinary:  Negative for difficulty urinating.  Musculoskeletal:  Positive for arthralgias and back pain.  Skin:  Negative for rash.  Allergic/Immunologic: Negative for environmental allergies.  Neurological:  Negative for dizziness, weakness, numbness and headaches.  Psychiatric/Behavioral:  The patient is nervous/anxious.         Past Medical History:  Diagnosis Date   Acid reflux    Adenocarcinoma of the endometrium/uterus ([place]) 12/04/2000   Anal fissure    Atrophic vaginitis    Difficult intubation    limited neck mobility   Esophagitis    Hernia    Present to proximal to umbillical    History of hiatal hernia    Hyperlipidemia    Hypertension    Internal hemorrhoids    Menorrhagia    Osteopenia    PONV (postoperative nausea and vomiting)    Vertigo    Vitamin D deficiency     Social History   Socioeconomic History   Marital status: Married    Spouse name: Not on file   Number of children: Not on file   Years of education: Not on file   Highest education level: Not on file  Occupational History   Not on file  Tobacco Use   Smoking status: Never   Smokeless tobacco: Never  Vaping Use   Vaping Use: Never used  Substance and Sexual Activity   Alcohol use:  No   Drug use: Never   Sexual activity: Yes    Birth control/protection: Post-menopausal, Surgical  Other Topics Concern   Not on file  Social History Narrative   Married   Lives in [place]   Has 2 children, 4 grandchildren   Enjoys walking, shopping.   Social Determinants of Health   Financial Resource Strain: Low Risk    Difficulty of Paying Living Expenses: Not hard at all  Food Insecurity: No Food Insecurity   Worried About Sales executive in the Last Year: Never true   [place] in the Last Year: Never true  Transportation Needs: No Transportation Needs   Lack of Transportation (Medical): No   Lack of Transportation (Non-Medical): No  Physical Activity: Sufficiently Active   Days of Exercise per Week: 7 days   Minutes of Exercise per Session: 30 min  Stress: No Stress Concern Present   Feeling of Stress : Not at all  Social Connections: Moderately Integrated   Frequency of Communication with Friends and Family: More than three times a week   Frequency of Social Gatherings with Friends and Family: Three times a week   Attends Religious Services: More than 4 times per year   Active Member of Clubs or Organizations: No   Attends Archivist Meetings: Never   Marital Status: Married  Human resources officer Violence: Not At Risk   Fear of Current or Ex-Partner: No   Emotionally Abused: No   Physically Abused: No   Sexually Abused: No    Past Surgical History:  Procedure Laterality Date   59 HOUR [place] STUDY N/A 11/29/2015   Procedure: [place];  Surgeon: Manus Gunning, MD;  Location: [facility];  Service: Gastroenterology;  Laterality: N/A;   ABDOMINAL HYSTERECTOMY  12/04/2000   also bilateral salpingo-oophorectomy   CARDIAC CATHETERIZATION  2006   CARPAL TUNNEL RELEASE  11/20/2011   Procedure: CARPAL TUNNEL RELEASE;  Surgeon: Elaina Hoops, MD;  Location: [place] [facility];  Service: Neurosurgery;  Laterality: Left;  LEFT carpal tunnel release   CERVICAL SPINE SURGERY  2013   [place]   COLONOSCOPY WITH PROPOFOL N/A 07/27/2020   Procedure: COLONOSCOPY WITH PROPOFOL;  Surgeon: Lucilla Lame, MD;  Location: [facility] ENDOSCOPY;  Service: Endoscopy;  Laterality: N/A;   DILATION AND CURETTAGE OF UTERUS     11/15/2000   ESOPHAGEAL MANOMETRY N/A 09/20/2015   Procedure: ESOPHAGEAL MANOMETRY (EM);  Surgeon: Manus Gunning, MD;  Location: [facility];  Service: Gastroenterology;   Laterality: N/A;   ESOPHAGOGASTRODUODENOSCOPY (EGD) WITH PROPOFOL N/A 05/17/2021   Procedure: ESOPHAGOGASTRODUODENOSCOPY (EGD) WITH PROPOFOL;  Surgeon: Virgel Manifold, MD;  Location: [facility] ENDOSCOPY;  Service: Endoscopy;  Laterality: N/A;   HYSTEROSCOPY  11/15/2000   D & C, resection of endometrial polyps   OOPHORECTOMY  12/04/2000   bilateral salpingo-oophorectomy done with TAH   SPINAL CORD STIMULATOR INSERTION Bilateral 04/12/2020   Procedure: INSERTION CERVICAL SPINAL STIMULATOR PULSE GENERATOR;  Surgeon: [name], MD;  Location: [facility] ORS;  Service: Neurosurgery;  Laterality: Bilateral;   SPINAL CORD STIMULATOR TRIAL N/A 03/22/2020   Procedure: CERVICAL SPINAL CORD STIMULATOR TRIAL PERCUTANEOUS;  Surgeon: [name], MD;  Location: [facility] ORS;  Service: Neurosurgery;  Laterality: N/A;   TUBAL LIGATION      Family History  Problem Relation Age of Onset   Hypertension Father    Prostate cancer Father    Hypertension Sister    Lung cancer Sister  Diabetes Brother    Heart disease Brother    Hypertension Brother    Diabetes Brother    Heart disease Brother    Breast cancer Neg Hx     No Known Allergies  Current Outpatient Medications on File Prior to Visit  Medication Sig Dispense Refill   acetaminophen (TYLENOL) 500 MG tablet Take 500-1,000 mg by mouth every 6 (six) hours as needed (for pain).     amLODipine (NORVASC) 5 MG tablet Take 1 tablet (5 mg total) by mouth daily. For blood pressure. 90 tablet 3   atorvastatin (LIPITOR) 20 MG tablet Take 20 mg by mouth daily.     Calcium Carbonate-Vitamin D (CALCIUM-VITAMIN D) 600-3.125 MG-MCG TABS Take by mouth.     estradiol (ESTRACE) 0.1 MG/GM vaginal cream Estrogen Cream Instruction Discard applicator Apply pea sized amount to tip of finger to urethra before bed. Wash hands well after application. Use Monday, Wednesday and Friday 42.5 g 3   linaCLOtide (LINZESS PO) Take by mouth.     metoprolol succinate (TOPROL-XL) 50 MG  24 hr tablet TAKE 1 TABLET DAILY FOR HEART RATE/PALPITATIONS 90 tablet 3   omeprazole (PRILOSEC) 40 MG capsule Take 1 capsule (40 mg total) by mouth 2 (two) times daily for 28 days. For heartburn. 56 capsule 0   valsartan (DIOVAN) 160 MG tablet Take 160 mg by mouth daily.     ondansetron (ZOFRAN) 4 MG tablet Take 1 tablet (4 mg total) by mouth every 6 (six) hours as needed for nausea. (Patient not taking: Reported on 06/27/2021) 20 tablet 0   No current facility-administered medications on file prior to visit.    BP 126/80    Pulse 78    Temp 97.6 F (36.4 C) (Temporal)    Ht 5\' 5"  (1.651 m)    Wt 150 lb (68 kg)    SpO2 98%    BMI 24.96 kg/m  Objective:   Physical Exam HENT:     Right Ear: Tympanic membrane and ear canal normal.     Left Ear: Tympanic membrane and ear canal normal.     Nose: Nose normal.  Eyes:     Conjunctiva/sclera: Conjunctivae normal.     Pupils: Pupils are equal, round, and reactive to light.  Neck:     Thyroid: No thyromegaly.  Cardiovascular:     Rate and Rhythm: Normal rate and regular rhythm.     Heart sounds: No murmur heard. Pulmonary:     Effort: Pulmonary effort is normal.     Breath sounds: Normal breath sounds. No rales.  Abdominal:     General: Bowel sounds are normal.     Palpations: Abdomen is soft.     Tenderness: There is generalized abdominal tenderness. There is no guarding.  Musculoskeletal:     Cervical back: Neck supple.     Lumbar back: No bony tenderness. Decreased range of motion. Negative right straight leg raise test and negative left straight leg raise test.       Back:  Lymphadenopathy:     Cervical: No cervical adenopathy.  Skin:    General: Skin is warm and dry.     Findings: No rash.  Neurological:     Mental Status: She is alert and oriented to person, place, and time.     Cranial Nerves: No cranial nerve deficit.     Deep Tendon Reflexes: Reflexes are normal and symmetric.  Psychiatric:        Mood and Affect: Mood  normal.  Assessment & Plan:      This visit occurred during the SARS-CoV-2 public health emergency.  Safety protocols were in place, including screening questions prior to the visit, additional usage of staff PPE, and extensive cleaning of exam room while observing appropriate contact time as indicated for disinfecting solutions.

## 2021-06-27 NOTE — Assessment & Plan Note (Signed)
Reviewed bone density scan from 2022.  Continue calcium and vitamin D.  Continue daily walking.

## 2021-06-27 NOTE — Assessment & Plan Note (Signed)
Controlled.  Continue amlodipine 5 mg daily, metoprolol succinate 50 mg, valsartan 160 mg.   Labs reviewed through [place].

## 2021-06-27 NOTE — Assessment & Plan Note (Signed)
Chronic and ongoing. Follows with GI.   Continue [place] samples per GI.

## 2021-06-27 NOTE — Assessment & Plan Note (Signed)
Discussed the importance of a healthy diet and regular exercise in order for weight loss, and to reduce the risk of further co-morbidity. ? ?Repeat A1C pending. ?

## 2021-06-27 NOTE — Telephone Encounter (Signed)
Pt called asking for a call back to discuss her lab results. Please advise.

## 2021-06-27 NOTE — Assessment & Plan Note (Signed)
Overall controlled, unclear if her nausea is secondary to reflux.   Continue omeprazole 40 mg daily. She will touch base with GI.  Reviewed endoscopy from 2022 through chart.

## 2021-06-27 NOTE — Assessment & Plan Note (Signed)
Following with Urology, office notes reviewed from November 2022.  Last UA without hematuria. Reviewed in [place].  Continue to monitor.

## 2021-06-27 NOTE — Assessment & Plan Note (Signed)
No concerns today. Continue to monitor. 

## 2021-06-27 NOTE — Assessment & Plan Note (Signed)
Exacerbated, now with left lower extremity pain.  No alarm signs.  Referral placed to physical therapy. Reviewed lumbar spine xray from 2020.

## 2021-06-27 NOTE — Assessment & Plan Note (Signed)
Unclear etiology.  Reviewed endoscopy from 2022, not sure if reflux is contributing. Continue omeprazole 40 mg daily.  I did encourage her to reach back out to GI.  In the meantime we will work on improving anxiety, will increase Cymbalta to 40 mg.

## 2021-06-27 NOTE — Telephone Encounter (Signed)
Pt is checking on status of this message.

## 2021-06-27 NOTE — Assessment & Plan Note (Signed)
Immunizations UTD. Colonoscopy UTD, due 2032. Mammogram and bone density scans UTD.  Discussed the importance of a healthy diet and regular exercise in order for weight loss, and to reduce the risk of further co-morbidity.  Exam today as noted. Labs reviewed and pending.

## 2021-06-27 NOTE — Telephone Encounter (Signed)
Called left message for patient that we had reviewed labs over the phone (see lab results.) if any further questions give [place] a call back.

## 2021-06-27 NOTE — Patient Instructions (Signed)
Stop by the lab prior to leaving today. I will notify you of your results once received.   You will be contacted regarding your referral to physical therapy.  Please let us know if you have not been contacted within two weeks.   We increased your duloxetine (Cymbalta) to 40 mg. This is for anxiety/depression. Take 2 of your 20 mg capsules at home. Please update me in 4 weeks.  Please get in touch with your GI doctor regarding your ongoing nausea.   It was a pleasure to see you today!  Preventive Care 32 Years and Older, Female Preventive care refers to lifestyle choices and visits with your health care provider that can promote health and wellness. Preventive care visits are also called wellness exams. What can I expect for my preventive care visit? Counseling Your health care provider may ask you questions about your: Medical history, including: Past medical problems. Family medical history. Pregnancy and menstrual history. History of falls. Current health, including: Memory and ability to understand (cognition). Emotional well-being. Home life and relationship well-being. Sexual activity and sexual health. Lifestyle, including: Alcohol, nicotine or tobacco, and drug use. Access to firearms. Diet, exercise, and sleep habits. Work and work Statistician. Sunscreen use. Safety issues such as seatbelt and bike helmet use. Physical exam Your health care provider will check your: Height and weight. These may be used to calculate your BMI (body mass index). BMI is a measurement that tells if you are at a healthy weight. Waist circumference. This measures the distance around your waistline. This measurement also tells if you are at a healthy weight and may help predict your risk of certain diseases, such as type 2 diabetes and high blood pressure. Heart rate and blood pressure. Body temperature. Skin for abnormal spots. What immunizations do I need? Vaccines are usually given at  various ages, according to a schedule. Your health care provider will recommend vaccines for you based on your age, medical history, and lifestyle or other factors, such as travel or where you work. What tests do I need? Screening Your health care provider may recommend screening tests for certain conditions. This may include: Lipid and cholesterol levels. Hepatitis C test. Hepatitis B test. HIV (human immunodeficiency virus) test. STI (sexually transmitted infection) testing, if you are at risk. Lung cancer screening. Colorectal cancer screening. Diabetes screening. This is done by checking your blood sugar (glucose) after you have not eaten for a while (fasting). Mammogram. Talk with your health care provider about how often you should have regular mammograms. BRCA-related cancer screening. This may be done if you have a family history of breast, ovarian, tubal, or peritoneal cancers. Bone density scan. This is done to screen for osteoporosis. Talk with your health care provider about your test results, treatment options, and if necessary, the need for more tests. Follow these instructions at home: Eating and drinking  Eat a diet that includes fresh fruits and vegetables, whole grains, lean protein, and low-fat dairy products. Limit your intake of foods with high amounts of sugar, saturated fats, and salt. Take vitamin and mineral supplements as recommended by your health care provider. Do not drink alcohol if your health care provider tells you not to drink. If you drink alcohol: Limit how much you have to 0-1 drink a day. Know how much alcohol is in your drink. In the [place] one drink equals one 12 oz bottle of beer (355 mL), one 5 oz glass of wine (148 mL), or one 1 oz glass of  hard liquor (44 mL). Lifestyle Brush your teeth every morning and night with fluoride toothpaste. Floss one time each day. Exercise for at least 30 minutes 5 or more days each week. Do not use any products  that contain nicotine or tobacco. These products include cigarettes, chewing tobacco, and vaping devices, such as e-cigarettes. If you need help quitting, ask your health care provider. Do not use drugs. If you are sexually active, practice safe sex. Use a condom or other form of protection in order to prevent STIs. Take aspirin only as told by your health care provider. Make sure that you understand how much to take and what form to take. Work with your health care provider to find out whether it is safe and beneficial for you to take aspirin daily. Ask your health care provider if you need to take a cholesterol-lowering medicine (statin). Find healthy ways to manage stress, such as: Meditation, yoga, or listening to music. Journaling. Talking to a trusted person. Spending time with friends and family. Minimize exposure to UV radiation to reduce your risk of skin cancer. Safety Always wear your seat belt while driving or riding in a vehicle. Do not drive: If you have been drinking alcohol. Do not ride with someone who has been drinking. When you are tired or distracted. While texting. If you have been using any mind-altering substances or drugs. Wear a helmet and other protective equipment during sports activities. If you have firearms in your house, make sure you follow all gun safety procedures. What's next? Visit your health care provider once a year for an annual wellness visit. Ask your health care provider how often you should have your eyes and teeth checked. Stay up to date on all vaccines. This information is not intended to replace advice given to you by your health care provider. Make sure you discuss any questions you have with your health care provider. Document Revised: 11/10/2020 Document Reviewed: 11/10/2020 Elsevier Patient Education  [place].

## 2021-06-28 ENCOUNTER — Ambulatory Visit: Payer: Medicare HMO

## 2021-06-29 ENCOUNTER — Ambulatory Visit (INDEPENDENT_AMBULATORY_CARE_PROVIDER_SITE_OTHER): Payer: Medicare HMO | Admitting: Gastroenterology

## 2021-06-29 ENCOUNTER — Ambulatory Visit
Admission: RE | Admit: 2021-06-29 | Discharge: 2021-06-29 | Disposition: A | Discharge status: Home / Self Care | Payer: Medicare HMO | Source: Ambulatory Visit | Attending: Gastroenterology | Admitting: Gastroenterology

## 2021-06-29 ENCOUNTER — Other Ambulatory Visit: Payer: Self-pay

## 2021-06-29 ENCOUNTER — Encounter: Payer: Self-pay | Admitting: Gastroenterology

## 2021-06-29 VITALS — BP 132/85 | HR 96 | Temp 98.7°F | Wt 149.0 lb

## 2021-06-29 DIAGNOSIS — K5900 Constipation, unspecified: Secondary | ICD-10-CM

## 2021-06-29 DIAGNOSIS — R110 Nausea: Secondary | ICD-10-CM | POA: Diagnosis not present

## 2021-06-29 NOTE — Progress Notes (Signed)
Jonathon Bellows MD, MRCP(U.K) [address]  [place]  [place], [place] [address]  Main: [phone]  Fax: [phone]   Primary Care Physician: Pleas Koch, NP  Primary Gastroenterologist:  Dr. Jonathon Bellows   Chief Complaint  Patient presents with   Nausea    HPI: SHAUNE MALACARA is a 71 y.o. female  For the patient who has previously seen Dr. Bonna Gains last on 05/12/2021 for epigastric pain, treated with PPI has heartburn., constipation on Linzess 145 mcg a day.  Previously seen by another gastroenterologist.  In September 2020 to have partial small bowel obstruction conservatively treated.  History of cholecystectomy.  Last colonoscopy in March 2022 by Dr. Allen Norris that showed internal hemorrhoids and a repeat was recommended in 10 years.  Prior history of pH study, esophageal manometry in 2017 and 2 felt that she had functional heartburn and treated with a trial of desipramine.  In 2005 EGD showed esophagitis grade A  Interval history 05/12/2021-06/29/2021  At that time she was having epigastric pain nausea and loose stools.  05/17/2021: EGD: Inflammation seen in the gastric antrum and esophagus appeared normal.  Pancreatic elastase was normal.  Her main complaint at this point of time does not.  Usually in the mornings.  No vomiting.  She consumes diet soda.  Has some bloating but not much.  She does have a spinal stimulator for pain relief.  It has not been functioning right.  She has been having difficulty getting touch with her physician to have it reviewed.   Current Outpatient Medications  Medication Sig Dispense Refill   acetaminophen (TYLENOL) 500 MG tablet Take 500-1,000 mg by mouth every 6 (six) hours as needed (for pain).     amLODipine (NORVASC) 5 MG tablet Take 1 tablet (5 mg total) by mouth daily. For blood pressure. 90 tablet 3   atorvastatin (LIPITOR) 20 MG tablet Take 20 mg by mouth daily.     Calcium Carbonate-Vitamin D (CALCIUM-VITAMIN D) 600-3.125 MG-MCG  TABS Take by mouth.     DULoxetine (CYMBALTA) 20 MG capsule Take 2 capsules (40 mg total) by mouth daily. For anxiety/depression/pain 180 capsule 0   linaCLOtide (LINZESS PO) Take by mouth.     metoprolol succinate (TOPROL-XL) 50 MG 24 hr tablet TAKE 1 TABLET DAILY FOR HEART RATE/PALPITATIONS 90 tablet 3   omeprazole (PRILOSEC) 40 MG capsule Take 1 capsule (40 mg total) by mouth 2 (two) times daily for 28 days. For heartburn. 56 capsule 0   valsartan (DIOVAN) 160 MG tablet Take 160 mg by mouth daily.     ondansetron (ZOFRAN) 4 MG tablet Take 1 tablet (4 mg total) by mouth every 6 (six) hours as needed for nausea. (Patient not taking: Reported on 06/29/2021) 20 tablet 0   No current facility-administered medications for this visit.    Allergies as of 06/29/2021   (No Known Allergies)    ROS:  General: Negative for anorexia, weight loss, fever, chills, fatigue, weakness. ENT: Negative for hoarseness, difficulty swallowing , nasal congestion. CV: Negative for chest pain, angina, palpitations, dyspnea on exertion, peripheral edema.  Respiratory: Negative for dyspnea at rest, dyspnea on exertion, cough, sputum, wheezing.  GI: See history of present illness. GU:  Negative for dysuria, hematuria, urinary incontinence, urinary frequency, nocturnal urination.  Endo: Negative for unusual weight change.    Physical Examination:   BP 132/85    Pulse 96    Temp 98.7 F (37.1 C) (Oral)    Wt 149 lb (67.6  kg)    BMI 24.79 kg/m   General: Well-nourished, well-developed in no acute distress.  Eyes: No icterus. Conjunctivae pink. Mouth: Oropharyngeal mucosa moist and pink , no lesions erythema or exudate. Neuro: Alert and oriented x 3.  Grossly intact. Skin: Warm and dry, no jaundice.   Psych: Alert and cooperative, normal mood and affect.   Imaging Studies: No results found.  Assessment and Plan:   ALLISYN KUNZ is a 70 y.o. y/o female is here today to transfer care to me was previously seen  by Dr. Bonna Gains for epigastric pain nausea.  Recent EGD showed no gross abnormality pancreatic lipase was normal prior history suggestive of functional heartburn and constipation for which she was treated with Linzess .  Her main issues at this point of time is early morning nausea.  She is on maximum PPI therapy.  Unlikely that this is related to reflux.  No  visual, auditory or issues with balance.  My concerns are if the nausea is related to the stimulator or 2 to excess consumption of sodas with high fructose corn syrup.  she had some concern that she may have a bowel obstruction but clinically does not appear to be so ,but would get an x-ray to confirm there is no constipation.  She presently has diarrhea from the [place] which I told her to decrease the dose to 145 mcg a day from 290 mcg a day.  I have suggested her to cut down the soda consumption today and see how she feels for the next few days.  If she feels no better than it is unlikely related to food consumption but if she feels better should probably avoid it.  Often the high fructose corn syrup can cause bloating and gas.  If she has no response to cessation of her nausea then she should get in touch with her pain specialist to see if they can turn off the stimulator for a few days to see if the nausea improves.   I will see her back in 4 weeks time and if the symptoms still persist our options are to get an MRI of the brain versus second opinion at [facility] or [facility]  Dr Jonathon Bellows  MD,MRCP [facility]) Follow up in 4 weeks

## 2021-06-30 ENCOUNTER — Telehealth: Payer: Self-pay | Admitting: Primary Care

## 2021-06-30 DIAGNOSIS — G8929 Other chronic pain: Secondary | ICD-10-CM

## 2021-06-30 DIAGNOSIS — M545 Low back pain, unspecified: Secondary | ICD-10-CM

## 2021-06-30 NOTE — Telephone Encounter (Signed)
Mr. Valerie [name] called in from [facility] and he stated that they received a referral for Valerie [name] and when they ran the insurance she is out of network. And they called the patient to inform her and she said she will wait.

## 2021-07-01 ENCOUNTER — Encounter: Payer: Medicare HMO | Admitting: Primary Care

## 2021-07-01 NOTE — Telephone Encounter (Signed)
Noted. Is she okay seeing another PT group that is in network or is she saying that she doesn't want PT at all?

## 2021-07-04 NOTE — Telephone Encounter (Signed)
Placed new referral, not sure how she ended up somewhere out of network.  Valerie [name], FYI.

## 2021-07-04 NOTE — Addendum Note (Signed)
Addended by: Pleas Koch on: 07/04/2021 01:51 PM   Modules accepted: Orders

## 2021-07-04 NOTE — Progress Notes (Signed)
Inform x ray shows no constipation

## 2021-07-04 NOTE — Telephone Encounter (Signed)
Called patient she states she would like to be sent to [place] to someone in her network.

## 2021-07-05 ENCOUNTER — Telehealth: Payer: Self-pay

## 2021-07-05 NOTE — Telephone Encounter (Signed)
Patient called wanting her results from the imaging done dr Vicente Males said xray showed no constipation so that's what i told patient

## 2021-07-07 NOTE — Telephone Encounter (Signed)
Called and spoke with patient, I have no way of knowing what locations are in Network for the patients insurance, this is something the patient needs to call and discuss with her insurance.   I called her and gave her come location names, she is going to call me back once she finds out from her insurance who is preferred and I will process her referral.

## 2021-07-21 DIAGNOSIS — T85192A Other mechanical complication of implanted electronic neurostimulator (electrode) of spinal cord, initial encounter: Secondary | ICD-10-CM | POA: Diagnosis not present

## 2021-07-21 DIAGNOSIS — Z9689 Presence of other specified functional implants: Secondary | ICD-10-CM | POA: Diagnosis not present

## 2021-07-22 ENCOUNTER — Other Ambulatory Visit: Payer: Self-pay | Admitting: Neurosurgery

## 2021-07-22 DIAGNOSIS — Z01818 Encounter for other preprocedural examination: Secondary | ICD-10-CM

## 2021-07-26 ENCOUNTER — Other Ambulatory Visit: Payer: Self-pay

## 2021-07-26 ENCOUNTER — Other Ambulatory Visit
Admission: RE | Admit: 2021-07-26 | Discharge: 2021-07-26 | Disposition: A | Discharge status: Home / Self Care | Payer: Medicare HMO | Source: Ambulatory Visit | Attending: Neurosurgery | Admitting: Neurosurgery

## 2021-07-26 DIAGNOSIS — Z01812 Encounter for preprocedural laboratory examination: Secondary | ICD-10-CM | POA: Diagnosis not present

## 2021-07-26 HISTORY — DX: Anxiety disorder, unspecified: F41.9

## 2021-07-26 LAB — BASIC METABOLIC PANEL
Anion gap: 7 (ref 5–15)
BUN: 17 mg/dL (ref 8–23)
CO2: 28 mmol/L (ref 22–32)
Calcium: 9.4 mg/dL (ref 8.9–10.3)
Chloride: 108 mmol/L (ref 98–111)
Creatinine, Ser: 0.66 mg/dL (ref 0.44–1.00)
GFR, Estimated: >60 mL/min (ref >60)
Glucose, Bld: 104 mg/dL — ABNORMAL HIGH (ref 70–99)
Potassium: 3.5 mmol/L (ref 3.5–5.1)
Sodium: 143 mmol/L (ref 135–145)

## 2021-07-26 LAB — PROTIME-INR
INR: 1 (ref 0.8–1.2)
Prothrombin Time: 13 seconds (ref 11.4–15.2)

## 2021-07-26 LAB — URINALYSIS, ROUTINE W REFLEX MICROSCOPIC
Bacteria, UA: NONE SEEN
Bilirubin Urine: NEGATIVE
Glucose, UA: NEGATIVE mg/dL
Ketones, ur: NEGATIVE mg/dL
Nitrite: NEGATIVE
Protein, ur: NEGATIVE mg/dL
RBC / HPF: >50 RBC/hpf — ABNORMAL HIGH (ref 0–5)
Specific Gravity, Urine: 1.017 (ref 1.005–1.030)
pH: 6 (ref 5.0–8.0)

## 2021-07-26 LAB — CBC
HCT: 39.2 % (ref 36.0–46.0)
Hemoglobin: 12.9 g/dL (ref 12.0–15.0)
MCH: 31.5 pg (ref 26.0–34.0)
MCHC: 32.9 g/dL (ref 30.0–36.0)
MCV: 95.6 fL (ref 80.0–100.0)
Platelets: 205 10*3/uL (ref 150–400)
RBC: 4.1 MIL/uL (ref 3.87–5.11)
RDW: 13.2 % (ref 11.5–15.5)
WBC: 5 10*3/uL (ref 4.0–10.5)
nRBC: 0 % (ref 0.0–0.2)

## 2021-07-26 LAB — TYPE AND SCREEN
ABO/RH(D): A POS
Antibody Screen: NEGATIVE

## 2021-07-26 LAB — SURGICAL PCR SCREEN
MRSA, PCR: NEGATIVE
Staphylococcus aureus: POSITIVE — AB

## 2021-07-26 LAB — APTT: aPTT: 30 seconds (ref 24–36)

## 2021-07-26 NOTE — Patient Instructions (Signed)
Your procedure is scheduled on: August 01, 2021. Report to Day Surgery inside [place] 2nd floor, stop by admissions desk before getting on elevator. To find out your arrival time please call [phone] between 1PM - 3PM on Friday July 29, 2021.  Remember: Instructions that are not followed completely may result in serious medical risk,  up to and including death, or upon the discretion of your surgeon and anesthesiologist your  surgery may need to be rescheduled.     _X__ 1. Do not eat food after midnight the night before your procedure.                 No chewing gum or hard candies. You may drink clear liquids up to 2 hours                 before you are scheduled to arrive for your surgery- DO not drink clear                 liquids within 2 hours of the start of your surgery.                 Clear Liquids include:  water, apple juice without pulp, clear Gatorade, G2 or                  Gatorade Zero (avoid Red/Purple/Blue), Black Coffee or Tea (Do not add                 anything to coffee or tea).  __X__2.  On the morning of surgery brush your teeth with toothpaste and water, you                may rinse your mouth with mouthwash if you wish.  Do not swallow any toothpaste or mouthwash.     _X__ 3.  No Alcohol for 24 hours before or after surgery.   _X__ 4.  Do Not Smoke or use e-cigarettes For 24 Hours Prior to Your Surgery.                 Do not use any chewable tobacco products for at least 6 hours prior to                 Surgery.  _X__  5.  Do not use any recreational drugs (marijuana, cocaine, heroin, ecstasy, MDMA or other)                For at least one week prior to your surgery.  Combination of these drugs with anesthesia                May have life threatening results.  ____  6.  Bring all medications with you on the day of surgery if instructed.   __X__  7.  Notify your doctor if there is any change in your medical condition       (cold, fever, infections).     Do not wear jewelry, make-up, hairpins, clips or nail polish. Do not wear lotions, powders, or perfumes. You may wear deodorant. Do not shave 48 hours prior to surgery. Men may shave face and neck. Do not bring valuables to the hospital.    [facility] is not responsible for any belongings or valuables.  Contacts, dentures or bridgework may not be worn into surgery. Leave your suitcase in the car. After surgery it may be brought to your room. For patients admitted to the hospital, discharge time is determined by  your treatment team.   Patients discharged the day of surgery will not be allowed to drive home.   Make arrangements for someone to be with you for the first 24 hours of your Same Day Discharge.   __X__ Take these medicines the morning of surgery with A SIP OF WATER:    1. amLODipine (NORVASC) 5 MG   2. atorvastatin (LIPITOR) 10 MG   3. DULoxetine (CYMBALTA) 40 MG  4. metoprolol succinate (TOPROL-XL) 50 MG   5. omeprazole (PRILOSEC) 40 MG  6.  ____ Fleet Enema (as directed)   __X__ Use CHG Soap (or wipes) as directed  ____ Use Benzoyl Peroxide Gel as instructed  ____ Use inhalers on the day of surgery  ____ Stop metformin 2 days prior to surgery    ____ Take 1/2 of usual insulin dose the night before surgery. No insulin the morning          of surgery.   ____ Call your PCP, cardiologist, or Pulmonologist if taking Coumadin/Plavix/aspirin and ask when to stop before your surgery.   __X__ One Week prior to surgery- Stop Anti-inflammatories such as Ibuprofen, Aleve, Advil, Motrin, meloxicam (MOBIC), diclofenac, etodolac, ketorolac, Toradol, Daypro, piroxicam, Goody's or BC powders. OK TO USE TYLENOL IF NEEDED   __X__ Stop supplements until after surgery.    ____ Bring C-Pap to the hospital.    If you have any questions regarding your pre-procedure instructions,  Please call Pre-admit Testing at [phone]

## 2021-07-27 ENCOUNTER — Telehealth: Payer: Medicare HMO | Admitting: Gastroenterology

## 2021-08-01 ENCOUNTER — Other Ambulatory Visit: Payer: Self-pay

## 2021-08-01 ENCOUNTER — Encounter
Admission: RE | Disposition: A | Discharge status: Home / Self Care | Payer: Self-pay | Source: Home / Self Care | Attending: Neurosurgery

## 2021-08-01 ENCOUNTER — Ambulatory Visit: Payer: Medicare HMO | Admitting: Urgent Care

## 2021-08-01 ENCOUNTER — Ambulatory Visit
Admission: RE | Admit: 2021-08-01 | Discharge: 2021-08-01 | Disposition: A | Discharge status: Home / Self Care | Payer: Medicare HMO | Attending: Neurosurgery | Admitting: Neurosurgery

## 2021-08-01 ENCOUNTER — Encounter: Payer: Self-pay | Admitting: Neurosurgery

## 2021-08-01 ENCOUNTER — Ambulatory Visit: Payer: Medicare HMO

## 2021-08-01 DIAGNOSIS — T85192A Other mechanical complication of implanted electronic neurostimulator (electrode) of spinal cord, initial encounter: Secondary | ICD-10-CM | POA: Insufficient documentation

## 2021-08-01 DIAGNOSIS — I10 Essential (primary) hypertension: Secondary | ICD-10-CM | POA: Diagnosis not present

## 2021-08-01 DIAGNOSIS — F418 Other specified anxiety disorders: Secondary | ICD-10-CM | POA: Diagnosis not present

## 2021-08-01 DIAGNOSIS — M4322 Fusion of spine, cervical region: Secondary | ICD-10-CM | POA: Diagnosis not present

## 2021-08-01 DIAGNOSIS — Z419 Encounter for procedure for purposes other than remedying health state, unspecified: Secondary | ICD-10-CM

## 2021-08-01 DIAGNOSIS — Z981 Arthrodesis status: Secondary | ICD-10-CM | POA: Diagnosis not present

## 2021-08-01 DIAGNOSIS — K219 Gastro-esophageal reflux disease without esophagitis: Secondary | ICD-10-CM | POA: Insufficient documentation

## 2021-08-01 DIAGNOSIS — E785 Hyperlipidemia, unspecified: Secondary | ICD-10-CM | POA: Insufficient documentation

## 2021-08-01 DIAGNOSIS — Z9689 Presence of other specified functional implants: Secondary | ICD-10-CM | POA: Diagnosis not present

## 2021-08-01 HISTORY — PX: LUMBAR SPINAL CORD STIMULATOR LEAD REMOVAL: SHX6819

## 2021-08-01 SURGERY — LUMBAR SPINAL CORD STIMULATOR LEAD REMOVAL
Anesthesia: General | Site: Back

## 2021-08-01 MED ORDER — DEXAMETHASONE SODIUM PHOSPHATE 10 MG/ML IJ SOLN
INTRAMUSCULAR | Status: DC | PRN
  Administered 2021-08-01: 10 mg via INTRAVENOUS

## 2021-08-01 MED ORDER — FENTANYL CITRATE (PF) 100 MCG/2ML IJ SOLN
INTRAMUSCULAR | Status: AC
  Administered 2021-08-01: 25 ug via INTRAVENOUS
  Filled 2021-08-01: qty 2

## 2021-08-01 MED ORDER — PHENYLEPHRINE HCL (PRESSORS) 10 MG/ML IV SOLN
INTRAVENOUS | Status: DC | PRN
  Administered 2021-08-01: 160 ug via INTRAVENOUS

## 2021-08-01 MED ORDER — CHLORHEXIDINE GLUCONATE 0.12 % MT SOLN
OROMUCOSAL | Status: AC
  Administered 2021-08-01: 15 mL via OROMUCOSAL
  Filled 2021-08-01: qty 15

## 2021-08-01 MED ORDER — GLYCOPYRROLATE 0.2 MG/ML IJ SOLN
INTRAMUSCULAR | Status: DC | PRN
Start: 2021-08-01 — End: 2021-08-01
  Administered 2021-08-01: .2 mg via INTRAVENOUS

## 2021-08-01 MED ORDER — GLYCOPYRROLATE 0.2 MG/ML IJ SOLN
INTRAMUSCULAR | Status: AC
  Filled 2021-08-01: qty 1

## 2021-08-01 MED ORDER — ORAL CARE MOUTH RINSE: 15.0000 mL | Freq: Once | OROMUCOSAL | Status: AC

## 2021-08-01 MED ORDER — FENTANYL CITRATE (PF) 100 MCG/2ML IJ SOLN
INTRAMUSCULAR | Status: AC
  Filled 2021-08-01: qty 2

## 2021-08-01 MED ORDER — EPHEDRINE 5 MG/ML INJ
INTRAVENOUS | Status: AC
  Filled 2021-08-01: qty 5

## 2021-08-01 MED ORDER — ONDANSETRON HCL 4 MG/2ML IJ SOLN
INTRAMUSCULAR | Status: DC | PRN
  Administered 2021-08-01: 4 mg via INTRAVENOUS

## 2021-08-01 MED ORDER — PROPOFOL 1000 MG/100ML IV EMUL
INTRAVENOUS | Status: AC
  Filled 2021-08-01: qty 100

## 2021-08-01 MED ORDER — SUCCINYLCHOLINE CHLORIDE 200 MG/10ML IV SOSY
PREFILLED_SYRINGE | INTRAVENOUS | Status: AC
  Filled 2021-08-01: qty 10

## 2021-08-01 MED ORDER — PROPOFOL 10 MG/ML IV BOLUS
INTRAVENOUS | Status: DC | PRN
  Administered 2021-08-01: 100 mg via INTRAVENOUS

## 2021-08-01 MED ORDER — EPHEDRINE SULFATE (PRESSORS) 50 MG/ML IJ SOLN
INTRAMUSCULAR | Status: DC | PRN
  Administered 2021-08-01: 10 mg via INTRAVENOUS

## 2021-08-01 MED ORDER — ONDANSETRON HCL 4 MG/2ML IJ SOLN
4.0000 mg | Freq: Once | INTRAMUSCULAR | Status: AC | PRN
  Administered 2021-08-01: 4 mg via INTRAVENOUS

## 2021-08-01 MED ORDER — SUCCINYLCHOLINE CHLORIDE 200 MG/10ML IV SOSY
PREFILLED_SYRINGE | INTRAVENOUS | Status: DC | PRN
Start: 2021-08-01 — End: 2021-08-01
  Administered 2021-08-01: 100 mg via INTRAVENOUS

## 2021-08-01 MED ORDER — ONDANSETRON HCL 4 MG/2ML IJ SOLN
INTRAMUSCULAR | Status: AC
  Filled 2021-08-01: qty 2

## 2021-08-01 MED ORDER — OXYCODONE HCL 5 MG PO TABS: 5.0000 mg | ORAL_TABLET | Freq: Four times a day (QID) | ORAL | 0 refills | Status: AC | PRN

## 2021-08-01 MED ORDER — PROPOFOL 10 MG/ML IV BOLUS
INTRAVENOUS | Status: AC
  Filled 2021-08-01: qty 20

## 2021-08-01 MED ORDER — BUPIVACAINE-EPINEPHRINE (PF) 0.5% -1:200000 IJ SOLN
INTRAMUSCULAR | Status: DC | PRN
  Administered 2021-08-01: 5 mL

## 2021-08-01 MED ORDER — LIDOCAINE HCL (CARDIAC) PF 100 MG/5ML IV SOSY
PREFILLED_SYRINGE | INTRAVENOUS | Status: DC | PRN
  Administered 2021-08-01: 100 mg via INTRAVENOUS

## 2021-08-01 MED ORDER — LACTATED RINGERS IV SOLN: INTRAVENOUS | Status: DC

## 2021-08-01 MED ORDER — KETOROLAC TROMETHAMINE 30 MG/ML IJ SOLN
INTRAMUSCULAR | Status: AC
  Filled 2021-08-01: qty 1

## 2021-08-01 MED ORDER — 0.9 % SODIUM CHLORIDE (POUR BTL) OPTIME
TOPICAL | Status: DC | PRN
  Administered 2021-08-01: 500 mL

## 2021-08-01 MED ORDER — DEXAMETHASONE SODIUM PHOSPHATE 10 MG/ML IJ SOLN
INTRAMUSCULAR | Status: AC
  Filled 2021-08-01: qty 1

## 2021-08-01 MED ORDER — CHLORHEXIDINE GLUCONATE 0.12 % MT SOLN: 15.0000 mL | Freq: Once | OROMUCOSAL | Status: AC

## 2021-08-01 MED ORDER — CEFAZOLIN SODIUM-DEXTROSE 2-4 GM/100ML-% IV SOLN
2.0000 g | INTRAVENOUS | Status: AC
  Administered 2021-08-01: 2 g via INTRAVENOUS

## 2021-08-01 MED ORDER — FENTANYL CITRATE (PF) 100 MCG/2ML IJ SOLN
INTRAMUSCULAR | Status: DC | PRN
  Administered 2021-08-01 (×2): 25 ug via INTRAVENOUS
  Administered 2021-08-01: 50 ug via INTRAVENOUS

## 2021-08-01 MED ORDER — LIDOCAINE HCL (PF) 2 % IJ SOLN
INTRAMUSCULAR | Status: AC
  Filled 2021-08-01: qty 5

## 2021-08-01 MED ORDER — CEFAZOLIN SODIUM-DEXTROSE 2-4 GM/100ML-% IV SOLN
INTRAVENOUS | Status: AC
  Filled 2021-08-01: qty 100

## 2021-08-01 MED ORDER — KETOROLAC TROMETHAMINE 30 MG/ML IJ SOLN
INTRAMUSCULAR | Status: DC | PRN
  Administered 2021-08-01: 30 mg via INTRAVENOUS

## 2021-08-01 MED ORDER — BUPIVACAINE-EPINEPHRINE (PF) 0.5% -1:200000 IJ SOLN
INTRAMUSCULAR | Status: AC
  Filled 2021-08-01: qty 30

## 2021-08-01 MED ORDER — FENTANYL CITRATE (PF) 100 MCG/2ML IJ SOLN
25.0000 ug | INTRAMUSCULAR | Status: DC | PRN
  Administered 2021-08-01 (×3): 25 ug via INTRAVENOUS

## 2021-08-01 SURGICAL SUPPLY — 56 items
ADH SKN CLS APL DERMABOND .7 (GAUZE/BANDAGES/DRESSINGS) ×2
APL PRP STRL LF DISP 70% ISPRP (MISCELLANEOUS) ×2
BLADE BOVIE TIP EXT 4 (BLADE) ×2 IMPLANT
CHLORAPREP W/TINT 26 (MISCELLANEOUS) ×3 IMPLANT
CNTNR SPEC 2.5X3XGRAD LEK (MISCELLANEOUS) ×1
CONT SPEC 4OZ STER OR WHT (MISCELLANEOUS) ×1
CONT SPEC 4OZ STRL OR WHT (MISCELLANEOUS) ×1
CONTAINER SPEC 2.5X3XGRAD LEK (MISCELLANEOUS) ×1 IMPLANT
COUNTER NEEDLE 1200 MAGNETIC (NEEDLE) ×1 IMPLANT
COVER LIGHT HANDLE STERIS (MISCELLANEOUS) ×4 IMPLANT
CUP MEDICINE 2OZ PLAST GRAD ST (MISCELLANEOUS) ×2 IMPLANT
DERMABOND ADVANCED (GAUZE/BANDAGES/DRESSINGS) ×2
DERMABOND ADVANCED .7 DNX12 (GAUZE/BANDAGES/DRESSINGS) ×1 IMPLANT
DEVICE DSSCT PLSMBLD 3.0S LGHT (MISCELLANEOUS) IMPLANT
DRAPE LAPAROTOMY 100X77 ABD (DRAPES) ×2 IMPLANT
DRAPE SURG 17X11 SM STRL (DRAPES) ×2 IMPLANT
DRSG OPSITE POSTOP 4X6 (GAUZE/BANDAGES/DRESSINGS) ×2 IMPLANT
ELECT CAUTERY BLADE TIP 2.5 (TIP) ×2
ELECT EZSTD 165MM 6.5IN (MISCELLANEOUS) ×2
ELECT REM PT RETURN 9FT ADLT (ELECTROSURGICAL) ×2
ELECTRODE CAUTERY BLDE TIP 2.5 (TIP) ×1 IMPLANT
ELECTRODE EZSTD 165MM 6.5IN (MISCELLANEOUS) ×1 IMPLANT
ELECTRODE REM PT RTRN 9FT ADLT (ELECTROSURGICAL) ×1 IMPLANT
GAUZE 4X4 16PLY ~~LOC~~+RFID DBL (SPONGE) ×2 IMPLANT
GAUZE SPONGE 4X4 12PLY STRL (GAUZE/BANDAGES/DRESSINGS) IMPLANT
GLOVE SRG 8 PF TXTR STRL LF DI (GLOVE) ×1 IMPLANT
GLOVE SURG SYN 6.5 ES PF (GLOVE) ×4 IMPLANT
GLOVE SURG SYN 6.5 PF PI (GLOVE) ×2 IMPLANT
GLOVE SURG SYN 8.0 (GLOVE) ×2 IMPLANT
GLOVE SURG SYN 8.0 PF PI (GLOVE) ×1 IMPLANT
GLOVE SURG UNDER POLY LF SZ6.5 (GLOVE) ×4 IMPLANT
GLOVE SURG UNDER POLY LF SZ8 (GLOVE) ×2
GOWN SRG LRG LVL 4 IMPRV REINF (GOWNS) ×2 IMPLANT
GOWN STRL REIN LRG LVL4 (GOWNS) ×4
GOWN STRL REUS W/ TWL XL LVL3 (GOWN DISPOSABLE) ×2 IMPLANT
GOWN STRL REUS W/TWL XL LVL3 (GOWN DISPOSABLE) ×4
KIT TURNOVER KIT A (KITS) ×2 IMPLANT
MANIFOLD NEPTUNE II (INSTRUMENTS) ×2 IMPLANT
MARKER SKIN DUAL TIP RULER LAB (MISCELLANEOUS) ×2 IMPLANT
NDL SAFETY ECLIPSE 18X1.5 (NEEDLE) ×1 IMPLANT
NEEDLE HYPO 18GX1.5 SHARP (NEEDLE) ×2
NEEDLE HYPO 22GX1.5 SAFETY (NEEDLE) ×2 IMPLANT
PACK LAMINECTOMY NEURO (CUSTOM PROCEDURE TRAY) ×2 IMPLANT
PLASMABLADE 3.0S W/LIGHT (MISCELLANEOUS)
STAPLER SKIN PROX 35W (STAPLE) ×1 IMPLANT
SUT DVC VLOC 3-0 CL 6 P-12 (SUTURE) ×2 IMPLANT
SUT POLYSORB 2-0 5X18 GS-10 (SUTURE) ×2 IMPLANT
SUT VIC AB 0 CT1 18XCR BRD 8 (SUTURE) ×2 IMPLANT
SUT VIC AB 0 CT1 8-18 (SUTURE) ×2
SUT VIC AB 2-0 CT1 18 (SUTURE) ×2 IMPLANT
SYR 10ML LL (SYRINGE) ×4 IMPLANT
SYR 20ML LL LF (SYRINGE) ×2 IMPLANT
SYR 30ML LL (SYRINGE) ×4 IMPLANT
SYR 3ML LL SCALE MARK (SYRINGE) ×2 IMPLANT
TOWEL OR 17X26 4PK STRL BLUE (TOWEL DISPOSABLE) ×4 IMPLANT
TUBING CONNECTING 10 (TUBING) ×2 IMPLANT

## 2021-08-01 NOTE — Op Note (Signed)
Indications: the patient is a 71 yo female who was diagnosed with T85.192A-Malfunction of spinal cord stimulator, initial encounter , Z96.89-S/P insertion of spinal cord stimulator.  ? ?Due to nonfunctional stimulator, the patient asked for the device to be removed.  ?  ?Findings: successful removal of a spinal cord stimulator.   ?Preoperative Diagnosis: T85.192A-Malfunction of spinal cord stimulator, initial encounter , Z96.89-S/P insertion of spinal cord stimulator ?Postoperative Diagnosis: same ?  ?  ?EBL: 10 ml ?IVF: see AR ml ?Drains: none ?Disposition: Extubated and Stable to PACU ?Complications: none ?  ?No foley catheter was placed. ?  ?  ?Preoperative Note:  ?  ?Risks of surgery discussed in clinic. ?  ?Operative Note:  ?  ?  ?The patient was then brought from the preoperative center with intravenous access established.  The patient underwent general anesthesia and endotracheal tube intubation, then was rotated on the [facility] table where all pressure points were appropriately padded.  The prior incisions were identified.  The operative site was prepped and draped in standard fashion.  Full timeout was performed.   ? ?The pocket over the internal pulse generator was opened and the pulse generator identified.  It was detached from the pocket then removed.  The leads were divided.  The incision over the thoracic spine was then opened and the leads exposed.  The securing sutures were divided.  The percutaneous leads were then removed from the patient.  The leads were counted and 8 leads were identified.  They were intact.  A confirmatory x-ray was taken to ensure that no portion of the leads remained. ? ?This point, we began closing.  Each incision was closed in layers with 0 and 2-0 Vicryl and Dermabond on the skin. ? ?performed the entire procedure with the assistance of Cooper Render PA as an Pensions consultant. ?  ?  ?Meade Maw MD ? ?

## 2021-08-01 NOTE — Progress Notes (Signed)
Pharmacy Antibiotic Note ? ?Valerie [name] is a 71 y.o. female admitted on (Not on file) with surgical prophylaxis.  Pharmacy has been consulted for Cefazolin dosing. ? ?Plan: ?TBW = 66.2 kg  ? ?Cefazolin 2 gm IV X 1 ordered 60 min pre-op on 3/6 @ 0500.  ? ?  ? ?No data recorded. ? ?Recent Labs  ?Lab 07/26/21 ?0956  ?WBC 5.0  ?CREATININE 0.66  ?  ?Estimated Creatinine Clearance: 60.6 mL/min (by C-G formula based on SCr of 0.66 mg/dL).   ? ?No Known Allergies ? ?Antimicrobials this admission: ?  >>  ?  >>  ? ?Dose adjustments this admission: ? ? ?Microbiology results: ? BCx:  ? UCx:   ? Sputum:   ? MRSA PCR:  ? ?Thank you for allowing pharmacy to be a part of this patient?s care. ? ?Quinnten Calvin D ?08/01/2021 3:25 AM ? ?

## 2021-08-01 NOTE — Anesthesia Procedure Notes (Signed)
Procedure Name: Intubation ?Date/Time: 08/01/2021 3:25 PM ?Performed by: Cammie Sickle, CRNA ?Pre-anesthesia Checklist: Patient identified, Patient being monitored, Timeout performed, Emergency Drugs available and Suction available ?Patient Re-evaluated:Patient Re-evaluated prior to induction ?Oxygen Delivery Method: Circle system utilized ?Preoxygenation: Pre-oxygenation with 100% oxygen ?Induction Type: IV induction ?Ventilation: Mask ventilation without difficulty ?Laryngoscope Size: 3 and McGraph ?Grade View: Grade I ?Tube type: Oral ?Tube size: 6.5 mm ?Number of attempts: 1 ?Airway Equipment and Method: Stylet ?Placement Confirmation: ETT inserted through vocal cords under direct vision, positive ETCO2 and breath sounds checked- equal and bilateral ?Secured at: 20 cm ?Tube secured with: Tape ?Dental Injury: Teeth and Oropharynx as per pre-operative assessment  ? ? ? ? ?

## 2021-08-01 NOTE — Anesthesia Postprocedure Evaluation (Signed)
Anesthesia Post Note ? ?Patient: Valerie [name] ? ?Procedure(s) Performed: REMOVAL OF SPINAL CORD STIMULATOR (Back) ? ?Patient location during evaluation: PACU ?Anesthesia Type: General ?Level of consciousness: awake and alert ?Pain management: pain level controlled ?Vital Signs Assessment: post-procedure vital signs reviewed and stable ?Respiratory status: spontaneous breathing, nonlabored ventilation, respiratory function stable and patient connected to nasal cannula oxygen ?Cardiovascular status: blood pressure returned to baseline and stable ?Postop Assessment: no apparent nausea or vomiting ?Anesthetic complications: no ? ? ?No notable events documented. ? ? ?Last Vitals:  ?Vitals:  ? 08/01/21 1700 08/01/21 1723  ?BP: 97/68 111/71  ?Pulse: (!) 104 (!) 104  ?Resp: 17 15  ?Temp: (!) 36.2 ?C 36.5 ?C  ?SpO2: 95%   ?  ?Last Pain:  ?Vitals:  ? 08/01/21 1723  ?TempSrc: Temporal  ?PainSc: 0-No pain  ? ? ?  ?  ?  ?  ?  ?  ? ?Arita Miss ? ? ? ? ?

## 2021-08-01 NOTE — Discharge Instructions (Addendum)
NEUROSURGERY DISCHARGE INSTRUCTIONS ? ?Admission diagnosis: [id number] of spinal cord stimulator, initial encounter  ?Z96.89-SP insertion of spinal cord stimulator ? ?Operative procedure: removal of spinal cord stimulator ? ?What to do after you leave the hospital: ? ?Recommended diet: regular diet. Increase protein intake to promote wound healing. ? ?Recommended activity: no lifting, driving, or strenuous exercise for 2 weeks .You should walk multiple times per day ? ?Special Instructions ? ?No straining, no heavy lifting > 10lbs x 4 weeks.  Keep incision area clean and dry. May shower in 2 days. No baths or pools for 6 weeks.  ?Please remove dressing in two days, no need to apply a bandage afterwards ? ?You have no sutures to remove, the skin is closed with adhesive ? ?Please take pain medications as directed. Take a stool softener if on pain medications ? ? ?Please Report any of the following: ?Nausea or Vomiting, Temperature is greater than 101.60F (38.1C) degrees, Dizziness, Abdominal Pain, Difficulty Breathing or Shortness of Breath, Inability to Eat, drink Fluids, or Take medications, Bleeding, swelling, or drainage from surgical incision sites, New numbness or weakness, and Bowel or bladder dysfunction to the neurosurgeon on call at [phone] ? ?Additional Follow up appointments ?Please follow up with Cooper Render PA-C in [place] clinic as scheduled in 2-3 weeks ? ? ?Please see below for scheduled appointments: ? ?Future Appointments  ?Date Time Provider [place]  ?08/17/2021  8:45 AM [facility] CCM PHARMACIST [facility]  ?04/25/2022  9:00 AM Vaillancourt, Aldona Bar, PA-C [facility] None  ?06/23/2022  2:45 PM [facility] NURSE HEALTH ADVISOR [facility]  ? ? ?AMBULATORY SURGERY  ?DISCHARGE INSTRUCTIONS ? ? ?The drugs that you were given will stay in your system until tomorrow so for the next 24 hours you should not: ? ?Drive an automobile ?Make any legal decisions ?Drink any alcoholic  beverage ? ? ?You may resume regular meals tomorrow.  Today it is better to start with liquids and gradually work up to solid foods. ? ?You may eat anything you prefer, but it is better to start with liquids, then soup and crackers, and gradually work up to solid foods. ? ? ?Please notify your doctor immediately if you have any unusual bleeding, trouble breathing, redness and pain at the surgery site, drainage, fever, or pain not relieved by medication. ? ? ? ?Additional Instructions: ? ? ?Please contact your physician with any problems or Same Day Surgery at [phone], Monday through Friday 6 am to 4 pm, or [place] [facility] number at [phone].  ? ? ?  ?

## 2021-08-01 NOTE — Transfer of Care (Signed)
Immediate Anesthesia Transfer of Care Note ? ?Patient: Valerie [name] ? ?Procedure(s) Performed: REMOVAL OF SPINAL CORD STIMULATOR (Back) ? ?Patient Location: PACU ? ?Anesthesia Type:General ? ?Level of Consciousness: awake and drowsy ? ?Airway & Oxygen Therapy: Patient Spontanous Breathing ? ?Post-op Assessment: Report given to RN and Post -op Vital signs reviewed and stable ? ?Post vital signs: Reviewed and stable ? ?Last Vitals:  ?Vitals Value Taken Time  ?BP 123/78 08/01/21 1615  ?Temp 36.2 ?C 08/01/21 1615  ?Pulse 110 08/01/21 1620  ?Resp 19 08/01/21 1620  ?SpO2 95 % 08/01/21 1620  ?Vitals shown include unvalidated device data. ? ?Last Pain:  ?Vitals:  ? 08/01/21 1615  ?TempSrc:   ?PainSc: Asleep  ?   ? ?  ? ?Complications: No notable events documented. ?

## 2021-08-01 NOTE — H&P (Signed)
I have reviewed and confirmed my history and physical from 07/21/21 with no additions or changes. Plan for removal of spinal cord stimulator.  Risks and benefits reviewed. ? ?Heart sounds normal no MRG. Chest Clear to Auscultation Bilaterally. ? ? ?  ? ?

## 2021-08-01 NOTE — Discharge Summary (Signed)
Physician Discharge Summary  ?Patient ID: ?Valerie [name] ?MRN: [id number] ?DOB/AGE: 08/17/1950 71 y.o. ? ?Admit date: 08/01/2021 ?Discharge date: 08/01/2021 ? ?Admission Diagnoses: chronic pain ? ?Discharge Diagnoses:  ?Active Problems: ?  * No active hospital problems. * ? ? ?Discharged Condition: good ? ?Hospital Course:  ?Valerie [name] is a 71 y.o female presenting for removal of SCS. Her interoperative course was uncomplicated. She was monitored in PACU and discharged home after ambulating, urinating, and tolerating PO intake ? ?Consults: None ? ?Significant Diagnostic Studies: none  ? ?Treatments: surgery: as above. Please see separately dictated operative report for further details ? ?Discharge Exam: ?Blood pressure 125/72, pulse 73, temperature 98.6 ?F (37 ?C), temperature source Oral, resp. rate 15, height '5\' 5"'$  (1.651 m), weight 64.9 kg, SpO2 95 %. ?CN II-XII grossly intact ? 5/5 throughout ?Incisions c/d/I with  post-op bandages in place ? ?Disposition: Discharge disposition: 01-Home or Self Care ? ? ? ? ? ? ?Discharge Instructions   ? ? Diet - low sodium heart healthy   Complete by: As directed ?  ? Remove dressing in 48 hours   Complete by: As directed ?  ? ?  ? ?Allergies as of 08/01/2021   ?No Known Allergies ?  ? ?  ?Medication List  ?  ? ?TAKE these medications   ? ?acetaminophen 500 MG tablet ?Commonly known as: TYLENOL ?Take 500-1,000 mg by mouth every 6 (six) hours as needed for moderate pain. ?  ?amLODipine 5 MG tablet ?Commonly known as: NORVASC ?Take 1 tablet (5 mg total) by mouth daily. For blood pressure. ?What changed: additional instructions ?  ?aspirin EC 81 MG tablet ?Take 81 mg by mouth daily. Swallow whole. ?  ?atorvastatin 10 MG tablet ?Commonly known as: LIPITOR ?Take 10 mg by mouth daily. ?  ?CALCIUM-VITAMIN D PO ?Take 1 tablet by mouth daily. ?  ?DULoxetine 20 MG capsule ?Commonly known as: Cymbalta ?Take 2 capsules (40 mg total) by mouth daily. For anxiety/depression/pain ?What changed:  ?when  to take this ?reasons to take this ?additional instructions ?  ?metoprolol succinate 50 MG 24 hr tablet ?Commonly known as: TOPROL-XL ?TAKE 1 TABLET DAILY FOR HEART RATE/PALPITATIONS ?  ?omeprazole 40 MG capsule ?Commonly known as: PRILOSEC ?Take 1 capsule (40 mg total) by mouth 2 (two) times daily for 28 days. For heartburn. ?What changed:  ?when to take this ?additional instructions ?  ?ondansetron 4 MG tablet ?Commonly known as: ZOFRAN ?Take 1 tablet (4 mg total) by mouth every 6 (six) hours as needed for nausea. ?  ?oxyCODONE 5 MG immediate release tablet ?Commonly known as: Oxy IR/ROXICODONE ?Take 1 tablet (5 mg total) by mouth every 6 (six) hours as needed for up to 3 days for severe pain. ?  ?valsartan 160 MG tablet ?Commonly known as: DIOVAN ?Take 160 mg by mouth daily. ?  ? ?  ? ? Follow-up Information   ? ? Loleta Dicker, PA Follow up in 2 week(s).   ?Why: for post-op and incision check. This appointment was scheduled pre-op and should be in your pre-op paperwork ?Contact information: ?[place] [address] ?[phone] ? ? ?  ?  ? ?  ?  ? ?  ? ? ?Signed: ?Khylie Larmore ?08/01/2021, 4:07 PM ? ? ?

## 2021-08-01 NOTE — Anesthesia Preprocedure Evaluation (Addendum)
Anesthesia Evaluation  ?Patient identified by MRN, date of birth, ID band ?Patient awake ? ? ? ?Reviewed: ?Allergy & Precautions, NPO status , Patient's Chart, lab work & pertinent test results ? ?History of Anesthesia Complications ?(+) PONV, DIFFICULT AIRWAY and history of anesthetic complications ? ?Airway ?Mallampati: III ? ?TM Distance: <3 FB ?Neck ROM: full ? ?Mouth opening: Limited Mouth Opening ? Dental ? ?(+) Edentulous Upper, Edentulous Lower ?  ?Pulmonary ?neg pulmonary ROS,  ?  ?Pulmonary exam normal ?breath sounds clear to auscultation ? ? ? ? ? ? Cardiovascular ?Exercise Tolerance: Good ?hypertension, Pt. on medications ?negative cardio ROS ?Normal cardiovascular exam ?Rhythm:Regular Rate:Normal ? ? ?  ?Neuro/Psych ?Anxiety Depression negative neurological ROS ? negative psych ROS  ? GI/Hepatic ?negative GI ROS, Neg liver ROS, hiatal hernia,   ?Endo/Other  ?negative endocrine ROS ? Renal/GU ?negative Renal ROS  ?negative genitourinary ?  ?Musculoskeletal ?negative musculoskeletal ROS ?(+)  ? Abdominal ?Normal abdominal exam  (+)   ?Peds ?negative pediatric ROS ?(+)  Hematology ?negative hematology ROS ?(+)   ?Anesthesia Other Findings ?Past Medical History: ?No date: Acid reflux ?12/04/2000: Adenocarcinoma of the endometrium/uterus [facility]) ?No date: Anal fissure ?No date: Anxiety ?No date: Atrophic vaginitis ?No date: Difficult intubation ?    Comment:  limited neck mobility ?No date: Esophagitis ?No date: Hernia ?    Comment:  Present to proximal to umbillical  ?No date: History of hiatal hernia ?No date: Hyperlipidemia ?No date: Hypertension ?No date: Internal hemorrhoids ?No date: Menorrhagia ?No date: Osteopenia ?No date: PONV (postoperative nausea and vomiting) ?No date: Vertigo ?No date: Vitamin D deficiency ? ?Past Surgical History: ?11/29/2015: 24 HOUR PH STUDY; N/A ?    Comment:  Procedure: [place] STUDY;  Surgeon: Renelda Loma  ?             Havery Moros, MD;   Location: [facility];  Service:  ?             Gastroenterology;  Laterality: N/A; ?12/04/2000: ABDOMINAL HYSTERECTOMY ?    Comment:  also bilateral salpingo-oophorectomy ?2006: CARDIAC CATHETERIZATION ?11/20/2011: CARPAL TUNNEL RELEASE ?    Comment:  Procedure: CARPAL TUNNEL RELEASE;  Surgeon: Elaina Hoops, ?             MD;  Location: [place] [facility];  Service: Neurosurgery;   ?             Laterality: Left;  LEFT carpal tunnel release ?2013: CERVICAL SPINE SURGERY ?    Comment:  [place] ?1997: CHOLECYSTECTOMY ?07/27/2020: COLONOSCOPY WITH PROPOFOL; N/A ?    Comment:  Procedure: COLONOSCOPY WITH PROPOFOL;  Surgeon: Allen Norris,  ?             Darren, MD;  Location: [place];  Service:  ?             Endoscopy;  Laterality: N/A; ?No date: DILATION AND CURETTAGE OF UTERUS ?    Comment:  11/15/2000 ?09/20/2015: ESOPHAGEAL MANOMETRY; N/A ?    Comment:  Procedure: ESOPHAGEAL MANOMETRY (EM);  Surgeon: Remo Lipps  ?             Gaye Alken, MD;  Location: [facility];  Service:  ?             Gastroenterology;  Laterality: N/A; ?05/17/2021: ESOPHAGOGASTRODUODENOSCOPY (EGD) WITH PROPOFOL; N/A ?    Comment:  Procedure: ESOPHAGOGASTRODUODENOSCOPY (EGD) WITH  ?             PROPOFOL;  Surgeon: Virgel Manifold, MD;  Location:  ?             [place] [facility];  Service: Endoscopy;  Laterality: N/A; ?11/15/2000: HYSTEROSCOPY ?    Comment:  D & C, resection of endometrial polyps ?12/04/2000: OOPHORECTOMY ?    Comment:  bilateral salpingo-oophorectomy done with TAH ?04/12/2020: SPINAL CORD STIMULATOR INSERTION; Bilateral ?    Comment:  Procedure: INSERTION CERVICAL SPINAL STIMULATOR PULSE  ?             GENERATOR;  Surgeon: [name], MD;  Location: [facility]  ?             ORS;  Service: Neurosurgery;  Laterality: Bilateral; ?03/22/2020: SPINAL CORD STIMULATOR TRIAL; N/A ?    Comment:  Procedure: CERVICAL SPINAL CORD STIMULATOR TRIAL  ?             PERCUTANEOUS;  Surgeon: [name], MD;  Location: [facility] ?             ORS;  Service:  Neurosurgery;  Laterality: N/A; ?No date: TUBAL LIGATION ? ? ? ? Reproductive/Obstetrics ?negative OB ROS ? ?  ? ? ? ? ? ? ? ? ? ? ? ? ? ?  ?  ? ? ? ? ? ? ? ? ?Anesthesia Physical ?Anesthesia Plan ? ?ASA: 3 ? ?Anesthesia Plan: General  ? ?Post-op Pain Management:   ? ?Induction: Intravenous ? ?PONV Risk Score and Plan: 2 and Ondansetron and Dexamethasone ? ?Airway Management Planned: Oral ETT ? ?Additional Equipment:  ? ?Intra-op Plan:  ? ?Post-operative Plan: Extubation in OR ? ?Informed Consent: I have reviewed the patients History and Physical, chart, labs and discussed the procedure including the risks, benefits and alternatives for the proposed anesthesia with the patient or authorized representative who has indicated his/her understanding and acceptance.  ? ? ? ?Dental Advisory Given ? ?Plan Discussed with: CRNA and Surgeon ? ?Anesthesia Plan Comments:   ? ? ? ? ? ? ?Anesthesia Quick Evaluation ? ?

## 2021-08-02 ENCOUNTER — Encounter: Payer: Self-pay | Admitting: Neurosurgery

## 2021-08-12 ENCOUNTER — Telehealth: Payer: Self-pay

## 2021-08-12 NOTE — Progress Notes (Signed)
? ? ?  Chronic Care Management ?Pharmacy Assistant  ? ?Name: Valerie [name]  MRN: [id number] DOB: 04-03-1951 ? ?Reason for Encounter: CCM Counsellor) ? ?Medications: ?Outpatient Encounter Medications as of 08/12/2021  ?Medication Sig  ? acetaminophen (TYLENOL) 500 MG tablet Take 500-1,000 mg by mouth every 6 (six) hours as needed for moderate pain.  ? amLODipine (NORVASC) 5 MG tablet Take 1 tablet (5 mg total) by mouth daily. For blood pressure. (Patient taking differently: Take 5 mg by mouth daily.)  ? aspirin EC 81 MG tablet Take 81 mg by mouth daily. Swallow whole.  ? atorvastatin (LIPITOR) 10 MG tablet Take 10 mg by mouth daily.  ? CALCIUM-VITAMIN D PO Take 1 tablet by mouth daily.  ? DULoxetine (CYMBALTA) 20 MG capsule Take 2 capsules (40 mg total) by mouth daily. For anxiety/depression/pain (Patient taking differently: Take 40 mg by mouth daily as needed (anxiety).)  ? metoprolol succinate (TOPROL-XL) 50 MG 24 hr tablet TAKE 1 TABLET DAILY FOR HEART RATE/PALPITATIONS  ? omeprazole (PRILOSEC) 40 MG capsule Take 1 capsule (40 mg total) by mouth 2 (two) times daily for 28 days. For heartburn. (Patient taking differently: Take 40 mg by mouth daily.)  ? ondansetron (ZOFRAN) 4 MG tablet Take 1 tablet (4 mg total) by mouth every 6 (six) hours as needed for nausea.  ? valsartan (DIOVAN) 160 MG tablet Take 160 mg by mouth daily.  ? ?No facility-administered encounter medications on file as of 08/12/2021.  ? ?[name] was contacted to remind of upcoming telephone visit with [name] on 08/17/2021 at 8:45. Patient was reminded to have any blood glucose and blood pressure readings available for review at appointment. If unable to reach, a voicemail was left for patient.  ? ?Star Rating Drugs: ?Medication:  Last Fill: Day Supply ?Valsartan 160 mg 06/18/2021 90  ?Atorvastatin 10 mg 03/11/2021 90 ? ?[name], CPP notified ? ?[name], RMA ?Clinical Pharmacy Assistant ?[phone] ? ? ? ?

## 2021-08-15 ENCOUNTER — Telehealth: Payer: Medicare HMO

## 2021-08-17 ENCOUNTER — Other Ambulatory Visit: Payer: Self-pay

## 2021-08-17 ENCOUNTER — Ambulatory Visit (INDEPENDENT_AMBULATORY_CARE_PROVIDER_SITE_OTHER): Payer: Medicare HMO | Admitting: Pharmacist

## 2021-08-17 DIAGNOSIS — K5909 Other constipation: Secondary | ICD-10-CM

## 2021-08-17 DIAGNOSIS — R110 Nausea: Secondary | ICD-10-CM

## 2021-08-17 DIAGNOSIS — E785 Hyperlipidemia, unspecified: Secondary | ICD-10-CM

## 2021-08-17 DIAGNOSIS — I10 Essential (primary) hypertension: Secondary | ICD-10-CM

## 2021-08-17 DIAGNOSIS — M8580 Other specified disorders of bone density and structure, unspecified site: Secondary | ICD-10-CM

## 2021-08-17 DIAGNOSIS — K219 Gastro-esophageal reflux disease without esophagitis: Secondary | ICD-10-CM

## 2021-08-17 NOTE — Progress Notes (Signed)
? ?Chronic Care Management ?Pharmacy Note ? ?08/18/2021 ?Name:  Valerie [name] MRN:  [id number] DOB:  August 28, 1950 ? ?Summary: CCM F/U visit ?-Pt reports persistent nausea; she has seen GI and no one has been able to pinpoint the cause; she recently had spinal stimulator removed in hopes that it would improve symptoms, but it did not. Per chart review, Dr Vicente Males (GI) had advised follow up in early March if symptoms did not improve, with plan for MRI brain or 2nd opinion at [facility] ?-Pt requests referral to allergy to evaluate nausea - suggested by neurosurgery per pt ?-Pt requests anti-nausea medication ? ?Recommendations/Changes made from today's visit: ?-Advised pt to contact GI for follow up and nausea medication ?-Consult with PCP for allergy referral - see phone note 3/23 ? ?Plan: ?-[place] will call patient in 3 months for general update ?-Pharmacist follow up televisit scheduled for 6 months ?-No PCP f/u scheduled. ? ? ?Subjective: ?Valerie [name] is an 71 y.o. year old female who is a primary patient of Pleas Koch, NP.  The CCM team was consulted for assistance with disease management and care coordination needs.   ? ?Engaged with patient by telephone for follow up visit in response to provider referral for pharmacy case management and/or care coordination services.  ? ?Consent to Services:  ?The patient was given information about Chronic Care Management services, agreed to services, and gave verbal consent prior to initiation of services.  Please see initial visit note for detailed documentation.  ? ?Patient Care Team: ?Pleas Koch, NP as PCP - General (Nurse Practitioner) ?Kaysha Parsell, Cleaster Corin, [facility] as Pharmacist (Pharmacist) ? ? ?Recent office visits: ?06/27/21 NP Allie Bossier OV: annual exam. Increase duloxetine to 40 mg. ?06/21/21 LPN Tamina McCain - AWV ?04/27/21 NP Allie Bossier OV: f/u HTN. BP 134/72. No changes. ? ?04/08/21-PCP-Katherine Clark,NP-Patient presented for follow up  hypertension.Take only 1 tablet of losartan $RemoveBef'100mg'YYvRQOBBUP$  daily, increase amlodipine to $RemoveBefor'5mg'ngmYZzlpEueE$  once daily.Labs ordered (Kidneys and electrolytes look good) consider imaging of the brain if headaches persist. ? ?02/23/21-PCP-Katherine Clark,NP-Patient presented for follow up from hospital visit for epigastric and abdominal pain. Work on more hydration with water. ? ?12/08/20-PCP-Katherine Clark,NP-Patient presented for follow up cystitis-Today she endorses that she was called a few days after her ED visit and was told to stop Keflex and to start cefdinir 300 mg. She's been taking this BID as directed, will finish in a few days.Stop taking cefdinir antibiotics. Start Bactrim DS (sulfamethoxazole/trimethoprim) tablets for urinary tract infection. Take 1 tablet by mouth twice daily for 3 days.Referral to [facility],labs ordered. ?UA today with 3+ blood, negative for leuks and nitrites. Culture sent.  ? ?Recent consult visits: ?07/21/21 Dr Izora Ribas (Neurosurgery): plan removal of spinal cord stimulator - 08/01/21. ? ?06/29/21 Dr Vicente Males (GI): f/u constipation, nausea. Advised to decrease Linzess to 145 mcg due to diarrhea. Reduce sodas (high fructose corn syrup) ? ?06/13/21 Dr Nehemiah Massed (Cardiology): f/u Tachycardia, HTN, atherosclerosis. No med changes. ? ?06/01/21 GI TE - no improvement on Linzess. Change to Trulance 3 mg. ?05/17/21 Admission for EGD. ?05/12/21 Dr Bonna Gains (GI): f/u nausea. Increased Linzess to 290 mcg. Ordered EGD. Consider Trulance/Amitiza in future, but these are non-preferred. ? ?04/11/21 Dr Nehemiah Massed [facility] cardiology): f/u HTN,CAD. Change ARB to Valsartan 160 mg daily. ? ?04/04/21-Gastroenterology-Varnita Tahiliani,MD-Patient presented for initial visit for epigastric pain, constipation. -Reviewed CT-abdomen/pelvis. Labs ordered (normal) Start Linzess,consider upper endoscopy. ? ?03/24/21-Cardiology-Amanda Tobin,PA-Patient presented for hypertension and tachycardia. No symptoms at this time, EKG,normal,follow up 9  months ? ?02/21/21-General Surgery-Diego Pabon,MD- Patient presented for initial visit for partial bowel obstruction; No surgical intervention required.Discussed taking fiber supplement, metamucil ,benefiber daily with plenty of fluids and water. Follow up as needed ? ?02/08/21-Urology-Brian Sninsky,MD-Patient presented for cystoscopy procedure. ?12/16/20-Uology-Brian Sninsky,MD-Patient presented for recurrent UTI. Start trial of cranberry tablets and topical estrogen cream,follow up 6 months ? ?12/15/20-Cardiology-Bruce Kowalski,MD-Patient presented for follow up.Continue to monitor palpitations,ECG ordered, increase atorvastatin to 20mg  take 1 tablet daily,Amlodipine 5mg  take 1 tablet daily ? ?Hospital visits: ?02/15/21-[facility] ED-Patient presented for palpitations, self-resolved. EKG and Labs normal. CT scan for abdominal tenderness - no acute process, Aortic atherosclerosis. ? ?01/25/21 thru 01/28/21-[facility]-Admission for partial SBO. NG tube placed. Improved. F/u outpatient general surgery PRN. Hold: amlodipine ? ?11/30/20-[facility]-Zachary Smith,MD-Patient presented for epigastric and abdominal pain,xrays,labs ordered,EKG,Will write short course of Keflex.  Patient also given Carafate and Maalox in the ED and felt little better on reassessment. No admission. ? ? ?Objective: ? ?Lab Results  ?Component Value Date  ? CREATININE 0.66 07/26/2021  ? BUN 17 07/26/2021  ? GFR 72.34 04/08/2021  ? GFRNONAA >60 07/26/2021  ? GFRAA >60 02/03/2020  ? NA 143 07/26/2021  ? K 3.5 07/26/2021  ? CALCIUM 9.4 07/26/2021  ? CO2 28 07/26/2021  ? GLUCOSE 104 (H) 07/26/2021  ? ? ?Lab Results  ?Component Value Date/Time  ? HGBA1C 5.6 06/27/2021 08:53 AM  ? HGBA1C 5.5 06/29/2020 11:00 AM  ? GFR 72.34 04/08/2021 12:18 PM  ? GFR 63.26 12/08/2020 10:40 AM  ?  ?Last diabetic Eye exam: No results found for: HMDIABEYEEXA  ?Last diabetic Foot exam: No results found for: HMDIABFOOTEX  ? ?Lab Results  ?Component Value Date  ? CHOL 136 06/27/2021  ? HDL 40.60  06/27/2021  ? [place] 76 06/27/2021  ? TRIG 97.0 06/27/2021  ? CHOLHDL 3 06/27/2021  ? ? ? ?  Latest Ref Rng & Units 04/04/2021  ? 10:29 AM 01/27/2021  ?  5:59 AM 01/25/2021  ?  7:46 PM  ?Hepatic Function  ?Total Protein 6.0 - 8.5 g/dL 7.1   6.3   7.9    ?Albumin 3.8 - 4.8 g/dL 4.3   3.3   4.3    ?AST 0 - 40 IU/L 22   29   33    ?ALT 0 - 32 IU/L 17   16   20     ?Alk Phosphatase 44 - 121 IU/L 60   40   62    ?Total Bilirubin 0.0 - 1.2 mg/dL 0.7   2.1   1.8    ?Bilirubin, Direct 0.00 - 0.40 mg/dL 0.19      ? ? ?Lab Results  ?Component Value Date/Time  ? TSH 2.10 04/03/2019 09:42 AM  ? ? ? ?  Latest Ref Rng & Units 07/26/2021  ?  9:56 AM 02/15/2021  ? 11:49 AM 01/27/2021  ?  5:59 AM  ?CBC  ?WBC 4.0 - 10.5 K/uL 5.0   5.4   10.3    ?Hemoglobin 12.0 - 15.0 g/dL 12.9   13.0   12.7    ?Hematocrit 36.0 - 46.0 % 39.2   37.7   36.9    ?Platelets 150 - 400 K/uL 205   234   193    ? ? ?Lab Results  ?Component Value Date/Time  ? VD25OH 27.03 (L) 06/29/2020 11:00 AM  ? VD25OH 24.80 (L) 02/10/2019 09:01 AM  ? ? ?Clinical ASCVD: No  ?The 10-year ASCVD risk score (Arnett DK, et al., 2019) is: 8.3% ?  Values used to calculate the score: ?    Age: 41 years ?    Sex: Female ?    Is Non-Hispanic African American: No ?    Diabetic: No ?    Tobacco smoker: No ?    Systolic Blood Pressure: 501 mmHg ?    Is BP treated: Yes ?    HDL Cholesterol: 40.6 mg/dL ?    Total Cholesterol: 136 mg/dL   ? ? ?  06/21/2021  ?  3:11 PM 06/29/2020  ? 10:42 AM 10/21/2019  ? 12:04 PM  ?Depression screen PHQ 2/9  ?Decreased Interest 0 2 0  ?Down, Depressed, Hopeless 0 1 0  ?PHQ - 2 Score 0 3 0  ?Altered sleeping  2 0  ?Tired, decreased energy  1 0  ?Change in appetite  1 0  ?Feeling bad or failure about yourself   1 0  ?Trouble concentrating  1 0  ?Moving slowly or fidgety/restless  3 0  ?Suicidal thoughts  0 0  ?PHQ-9 Score  12 0  ?Difficult doing work/chores  Somewhat difficult Not difficult at all  ?  ? ?  06/29/2020  ? 10:43 AM  ?GAD 7 : Generalized Anxiety Score   ?Nervous, Anxious, on Edge 3  ?Control/stop worrying 2  ?Worry too much - different things 2  ?Trouble relaxing 3  ?Restless 3  ?Easily annoyed or irritable 0  ?Afraid - awful might happen 1  ?Total GAD 7 Score 14  ?Anxiety

## 2021-08-18 ENCOUNTER — Telehealth: Payer: Self-pay | Admitting: Pharmacist

## 2021-08-18 NOTE — Patient Instructions (Signed)
Visit Information ? ?Phone number for Pharmacist: [phone] ? ? Goals Addressed   ?None ?  ? ? ?Care Plan : [place]  ?Updates made by Charlton Haws, RPH since 08/18/2021 12:00 AM  ?  ? ?Problem: Hypertension, Hyperlipidemia, Depression, Anxiety, Osteopenia, and Chronic constipation/Nausea   ?Priority: High  ?  ? ?Long-Range Goal: Disease mgmt   ?Start Date: 04/26/2021  ?Expected End Date: 04/26/2022  ?This Visit's Progress: On track  ?Recent Progress: On track  ?Priority: High  ?Note:   ?Current Barriers:  ?Unable to independently monitor therapeutic efficacy ? ?Pharmacist Clinical Goal(s):  ?Patient will achieve adherence to monitoring guidelines and medication adherence to achieve therapeutic efficacy through collaboration with PharmD and provider.  ? ?Interventions: ?1:1 collaboration with Pleas Koch, NP regarding development and update of comprehensive plan of care as evidenced by provider attestation and co-signature ?Inter-disciplinary care team collaboration (see longitudinal plan of care) ?Comprehensive medication review performed; medication list updated in electronic medical record ? ?Hypertension (BP goal <130/80) ?-Controlled - pt-reported BP at home is at goal; pt denies headaches, dizziness, lightheadedness; she reports cardiologist recently changed losartan to valsartan and she endorses compliance with this change; white coat syndrome may explain recent high BP in office; she has recent hx of palpitations ?-Current home readings: 117/70 ?-Current treatment: ?Amlodipine 5 mg daily - Appropriate, Effective, Safe, Accessible ?Metoprolol succinate 50 mg daily - Appropriate, Effective, Safe, Accessible ?Valsartan 160 mg daily - Appropriate, Effective, Safe, Accessible ?-Medications previously tried: losartan  ?-Educated on BP goals and benefits of medications for prevention of heart attack, stroke and kidney damage; Daily salt intake goal < 2300 mg; Exercise goal of 150  minutes per week; Importance of home blood pressure monitoring; ?-Counseled to monitor BP at home daily, document, and provide log at future appointments; advised to bring BP monitor to office visit with PCP to callibrate ?-Recommended to continue current medication ? ?Hyperlipidemia: (LDL goal < 70) ?-Not ideally controlled - LDL is above goal, statin was increased July 2022, no repeat lipids yet; pt endorses compliance with statin ?-Current treatment: ?Atorvastatin 10 mg daily - Appropriate, Effective, Safe, Accessible ?-Educated on Cholesterol goals; Benefits of statin for ASCVD risk reduction; ?-Recommended to continue current medication ? ?Depression/Anxiety (Goal: manage symptoms) ?-Improving - pt endorses compliance with duloxetine ?-PHQ9: 12 (06/2020) - moderate depression ?-GAD7: 14 (06/2020) - moderate anxiety ?-Connected with PCP for mental health support ?-Current treatment: ?Duloxetine 40 mg daily - Appropriate, Effective, Safe, Accessible ?-Educated on daily dosing of duloxetine; discussed it is not meant to be used as PRN and will not provide relief if used this way ?-Recommend to start taking duloxetine daily instead of PRN ? ?Osteopenia (Goal prevent fractures) ?-Controlled - pt is taking Ca/Vitamin D supplement and walks for exercise regularly ?-Last DEXA Scan: 01/10/21  ? T-Score femoral neck: -1.2 ? T-Score total hip: -0.3 ? T-Score lumbar spine: -1.3 ? T-Score forearm radius: -1.0 ? 10-year probability of major osteoporotic fracture: 14.8% ? 10-year probability of hip fracture: 1.5% ?-Patient is not a candidate for pharmacologic treatment ?-Current treatment  ?Calcium-Vitamin D - Appropriate, Effective, Safe, Accessible ?-Recommend [phone] units of vitamin D daily. Recommend 1200 mg of calcium daily from dietary and supplemental sources. Recommend weight-bearing and muscle strengthening exercises for building and maintaining bone density. ? ?GERD (Goal: manage symptoms) ?-Controlled- GI has noted  that chronic nausea is not likely related to GERD ?-Current treatment  ?Omeprazole 40 mg daily - Appropriate, Effective, Safe, Accessible ?Tums PRN ?-Recommend  to continue current medication ? ?Chronic constipation (Goal: manage symptoms) ?-Not ideally controlled - pt reports Linzess has improved frequency of bowel movements but consistenty is now loose, watery; she reports she has tried Miralax, Ex-lax and fiber supplements individually but not as a combination ?-Current treatment  ?Linzess 145 mcg daily PRN - Appropriate, Query Effective ?-Counseled on importance of fiber, hydration to improve constipation ? ?Chronic nausea (Goal: manage symptoms) ?-Uncontrolled ?-Pt has seen multiple GI doctors to evaluate nausea and no one has been able to pinpoint the problem; she has tried avoid high fructose corn syrup with no improvement; per last GI appt (Dr Vicente Males 06/29/21) pt was to return in 4 weeks and consider MRI brain vs second opinion at [facility]. Per chart pt has not returned to GI ?-Pt also recently had spinal cord stimulator removed due to concern that it may be contributing to nausea. Removal has not improved nausea. ?-Pt reports her neurosurgeon recently suggested seeing an allergist as her problem may be an allergic reaction, per patient neurologist said to ask PCP for referral ?-Pt also asked for nausea medication ?-Plan: Consult with PCP for allergy referral. Advised pt to contact GI for f/u and nausea medication ? ?Health Maintenance ?-Vaccine gaps: none ?-Hx recurrent UTI; improved with estradiol vaginal cream ? ?Patient Goals/Self-Care Activities ?Patient will:  ?- take medications as prescribed as evidenced by patient report and record review ?focus on medication adherence by pill box ?check blood pressure daily, document, and provide at future appointments ?target a minimum of 150 minutes of moderate intensity exercise weekly ?-Contact GI for follow up appt/nausea medication ? ?  ?  ? ?Patient verbalizes  understanding of instructions and care plan provided today and agrees to view in [place]. Active MyChart status confirmed with patient.   ?Telephone follow up appointment with pharmacy team member scheduled for: 6 months ? ?[name], PharmD, BCACP ?Clinical Pharmacist ?[facility] ?[phone] ?  ?

## 2021-08-18 NOTE — Telephone Encounter (Signed)
Patient reports persistent nausea; she has seen GI and no one has been able to pinpoint the cause; she recently had spinal stimulator removed in hopes that it would improve symptoms, but it did not. Patient reports her neurosurgeon suggested seeing an allergist and advised contacting PCP for referral. ? ?Consulting with PCP for allergy referral to evaluate chronic nausea. ? ?Per chart review, Dr Vicente Males (GI) had advised follow up in early March if symptoms did not improve, with plan for MRI brain or 2nd opinion at [facility]. Also advised patient to follow up with GI as she has not had an appt yet. ?

## 2021-08-18 NOTE — Telephone Encounter (Signed)
Noted. ? ?I recommend patient follow-up with GI as GI requested. ?

## 2021-08-19 NOTE — Telephone Encounter (Signed)
Advised patient per CPP, PCP, and GI specialist,  please call office of GI (Dr.Anna) for continued nausea. ? ? ?[name], CPP notified ? ?Maliah Pyles, CCMA ?Health concierge  ?[phone]  ?

## 2021-08-23 ENCOUNTER — Telehealth: Payer: Self-pay

## 2021-08-23 NOTE — Telephone Encounter (Signed)
Patient left a voicemail because she has some questions for Dr. Vicente Males nurse  ?

## 2021-08-24 NOTE — Telephone Encounter (Signed)
Called patient back and had to leave her a voicemail letting her know that I called her back and that she could call me back at the office or send [place] a message through [place]. ?

## 2021-08-26 ENCOUNTER — Telehealth: Payer: Self-pay | Admitting: Gastroenterology

## 2021-08-26 DIAGNOSIS — M8580 Other specified disorders of bone density and structure, unspecified site: Secondary | ICD-10-CM

## 2021-08-26 DIAGNOSIS — E785 Hyperlipidemia, unspecified: Secondary | ICD-10-CM

## 2021-08-26 DIAGNOSIS — I10 Essential (primary) hypertension: Secondary | ICD-10-CM

## 2021-08-26 DIAGNOSIS — F32A Depression, unspecified: Secondary | ICD-10-CM | POA: Diagnosis not present

## 2021-08-26 NOTE — Telephone Encounter (Signed)
Pt is wanted to be referred to another physcian at [facility] not sure of who she wants to see. Will call back on Monday. ?

## 2021-08-26 NOTE — Telephone Encounter (Signed)
Called patient back and left her a voicemail to call us back. ?

## 2021-08-29 ENCOUNTER — Other Ambulatory Visit: Payer: Self-pay | Admitting: Primary Care

## 2021-08-29 DIAGNOSIS — K219 Gastro-esophageal reflux disease without esophagitis: Secondary | ICD-10-CM

## 2021-08-31 ENCOUNTER — Telehealth: Payer: Self-pay | Admitting: Gastroenterology

## 2021-08-31 NOTE — Telephone Encounter (Signed)
Patient requesting a call back from Dr Vicente Males, asking if she needs a referral from him to see a Dr in [facility].  ?

## 2021-09-01 ENCOUNTER — Telehealth: Payer: Self-pay

## 2021-09-01 NOTE — Telephone Encounter (Signed)
Referral sent to [facility] per patient's request.  ?

## 2021-09-01 NOTE — Telephone Encounter (Signed)
Dr. Vicente Males, what would you like to do for this patient. Apparently she was to have an appointment with you and she cancelled and did not want to reschedule. Now she is calling wanting a referral sent to [facility]. Please advise. ?

## 2021-09-12 ENCOUNTER — Telehealth: Payer: Self-pay

## 2021-09-12 DIAGNOSIS — R1084 Generalized abdominal pain: Secondary | ICD-10-CM

## 2021-09-12 DIAGNOSIS — K219 Gastro-esophageal reflux disease without esophagitis: Secondary | ICD-10-CM

## 2021-09-12 DIAGNOSIS — R110 Nausea: Secondary | ICD-10-CM

## 2021-09-12 DIAGNOSIS — K5909 Other constipation: Secondary | ICD-10-CM

## 2021-09-12 NOTE — Telephone Encounter (Signed)
Patient called stating that [place] office saw patient for chronic nausea/vomiting issue she has been having and she wanted to get second opinion. [facility] sent referral to [facility] per patient's request and it went to [place] office. Letter was received back stating that their office can not see patient in timely manner. Patient states her daughter called GI office with [facility] the other day and was told patient could be seen like in a week or 2 just need referral. Patient was not sure which office location that was and she will follow up with the daughter on that and give [place] a call. Patient would like for [name] to place referral for her if possible to work this out for her. ?

## 2021-09-12 NOTE — Telephone Encounter (Signed)
I'm happy to place the referral for [name]. ?Just let me know which office.  ? ?Thanks. ?

## 2021-09-13 NOTE — Telephone Encounter (Signed)
Noted, referral placed.  

## 2021-09-13 NOTE — Addendum Note (Signed)
Addended by: Pleas Koch on: 09/13/2021 04:30 PM ? ? Modules accepted: Orders ? ?

## 2021-09-13 NOTE — Telephone Encounter (Signed)
Pt called back and gave the following info for  her GI referral: ? ?[facility] ?[address] ?[place], [place] [address] ? ?Main Phone: [phone] ? ?Fax: [phone] ?

## 2021-09-13 NOTE — Telephone Encounter (Signed)
Called patient no voice mail

## 2021-09-15 ENCOUNTER — Emergency Department (HOSPITAL_BASED_OUTPATIENT_CLINIC_OR_DEPARTMENT_OTHER): Payer: Medicare HMO

## 2021-09-15 ENCOUNTER — Emergency Department (HOSPITAL_COMMUNITY): Payer: Medicare HMO

## 2021-09-15 ENCOUNTER — Encounter (HOSPITAL_COMMUNITY): Payer: Self-pay | Admitting: Pharmacy Technician

## 2021-09-15 ENCOUNTER — Other Ambulatory Visit: Payer: Self-pay

## 2021-09-15 ENCOUNTER — Emergency Department (HOSPITAL_COMMUNITY)
Admission: EM | Admit: 2021-09-15 | Discharge: 2021-09-15 | Disposition: A | Discharge status: Home / Self Care | Payer: Medicare HMO | Attending: Emergency Medicine | Admitting: Emergency Medicine

## 2021-09-15 DIAGNOSIS — M79652 Pain in left thigh: Secondary | ICD-10-CM | POA: Insufficient documentation

## 2021-09-15 DIAGNOSIS — Z7982 Long term (current) use of aspirin: Secondary | ICD-10-CM | POA: Insufficient documentation

## 2021-09-15 DIAGNOSIS — R109 Unspecified abdominal pain: Secondary | ICD-10-CM | POA: Diagnosis not present

## 2021-09-15 DIAGNOSIS — R748 Abnormal levels of other serum enzymes: Secondary | ICD-10-CM | POA: Diagnosis not present

## 2021-09-15 DIAGNOSIS — R112 Nausea with vomiting, unspecified: Secondary | ICD-10-CM | POA: Diagnosis present

## 2021-09-15 DIAGNOSIS — N3090 Cystitis, unspecified without hematuria: Secondary | ICD-10-CM | POA: Diagnosis not present

## 2021-09-15 DIAGNOSIS — N390 Urinary tract infection, site not specified: Secondary | ICD-10-CM

## 2021-09-15 DIAGNOSIS — M79605 Pain in left leg: Secondary | ICD-10-CM

## 2021-09-15 LAB — CBC
HCT: 39.6 % (ref 36.0–46.0)
Hemoglobin: 12.9 g/dL (ref 12.0–15.0)
MCH: 31.6 pg (ref 26.0–34.0)
MCHC: 32.6 g/dL (ref 30.0–36.0)
MCV: 97.1 fL (ref 80.0–100.0)
Platelets: 205 10*3/uL (ref 150–400)
RBC: 4.08 MIL/uL (ref 3.87–5.11)
RDW: 13.4 % (ref 11.5–15.5)
WBC: 6 10*3/uL (ref 4.0–10.5)
nRBC: 0 % (ref 0.0–0.2)

## 2021-09-15 LAB — COMPREHENSIVE METABOLIC PANEL
ALT: 17 U/L (ref 0–44)
AST: 25 U/L (ref 15–41)
Albumin: 3.8 g/dL (ref 3.5–5.0)
Alkaline Phosphatase: 55 U/L (ref 38–126)
Anion gap: 7 (ref 5–15)
BUN: 17 mg/dL (ref 8–23)
CO2: 27 mmol/L (ref 22–32)
Calcium: 9.6 mg/dL (ref 8.9–10.3)
Chloride: 107 mmol/L (ref 98–111)
Creatinine, Ser: 0.83 mg/dL (ref 0.44–1.00)
GFR, Estimated: >60 mL/min (ref >60)
Glucose, Bld: 112 mg/dL — ABNORMAL HIGH (ref 70–99)
Potassium: 3.4 mmol/L — ABNORMAL LOW (ref 3.5–5.1)
Sodium: 141 mmol/L (ref 135–145)
Total Bilirubin: 0.8 mg/dL (ref 0.3–1.2)
Total Protein: 6.9 g/dL (ref 6.5–8.1)

## 2021-09-15 LAB — URINALYSIS, ROUTINE W REFLEX MICROSCOPIC
Bilirubin Urine: NEGATIVE
Glucose, UA: NEGATIVE mg/dL
Ketones, ur: NEGATIVE mg/dL
Nitrite: POSITIVE — AB
Protein, ur: NEGATIVE mg/dL
Specific Gravity, Urine: 1.01 (ref 1.005–1.030)
pH: 6 (ref 5.0–8.0)

## 2021-09-15 LAB — LIPASE, BLOOD: Lipase: 56 U/L — ABNORMAL HIGH (ref 11–51)

## 2021-09-15 MED ORDER — LACTATED RINGERS IV BOLUS
1000.0000 mL | Freq: Once | INTRAVENOUS | Status: AC
  Administered 2021-09-15: 1000 mL via INTRAVENOUS

## 2021-09-15 MED ORDER — CEPHALEXIN 500 MG PO CAPS: 500.0000 mg | ORAL_CAPSULE | Freq: Four times a day (QID) | ORAL | 0 refills | Status: DC

## 2021-09-15 MED ORDER — IOHEXOL 350 MG/ML SOLN
100.0000 mL | Freq: Once | INTRAVENOUS | Status: AC | PRN
  Administered 2021-09-15: 100 mL via INTRAVENOUS

## 2021-09-15 MED ORDER — ONDANSETRON 4 MG PO TBDP: 4.0000 mg | ORAL_TABLET | Freq: Three times a day (TID) | ORAL | 0 refills | Status: DC | PRN

## 2021-09-15 MED ORDER — FENTANYL CITRATE PF 50 MCG/ML IJ SOSY
50.0000 ug | PREFILLED_SYRINGE | Freq: Once | INTRAMUSCULAR | Status: AC
  Administered 2021-09-15: 50 ug via INTRAVENOUS
  Filled 2021-09-15: qty 1

## 2021-09-15 MED ORDER — ONDANSETRON HCL 4 MG/2ML IJ SOLN
4.0000 mg | Freq: Once | INTRAMUSCULAR | Status: AC
  Administered 2021-09-15: 4 mg via INTRAVENOUS
  Filled 2021-09-15: qty 2

## 2021-09-15 NOTE — ED Triage Notes (Signed)
Pt here with reports of abdominal pain with nausea and emesis X3 months. States unable to keep food down. Pt also complains of "bone" pain to L leg. Denies injury.  ?

## 2021-09-15 NOTE — Progress Notes (Signed)
Lower extremity venous LT study completed. ? ?Preliminary results relayed to Roslynn Amble, MD. ? ?See CV Proc for preliminary results report.  ? ?Darlin Coco, RDMS, RVT ? ?

## 2021-09-15 NOTE — Discharge Instructions (Signed)
Please follow-up with your primary care doctor.  Additionally recommend following up with GI.  Take the antibiotic for possible UTI.  Use Zofran as needed for nausea.  Come back to ER if you develop uncontrolled vomiting, fever or other new concerning symptom. ?

## 2021-09-15 NOTE — ED Provider Notes (Signed)
?[facility] ?Provider Note ? ? ?CSN: [id number] ?Arrival date & time: 09/15/21  1033 ? ?  ? ?History ? ?Chief Complaint  ?Patient presents with  ? Emesis  ? Abdominal Pain  ? Leg Pain  ? ? ?Valerie [name] is a 71 y.o. female.  Presenting to the emergency department due to concern for nausea and vomiting.  Patient reports that she has been experiencing the symptoms for the past many months, over the last few days to couple weeks it seems to be getting worse.  At times feels like she cannot keep any food down.  Also is having some generalized abdominal discomfort.  Has felt constipated lately, still passing gas.  Also having some pain in her left leg, left thigh region.  Has not noted any significant swelling or skin color changes. ? ?Completed chart review, patient has been seen by gastroenterology previously for chronic nausea, last seen by Dr. Vicente Males with [place] [facility].  EGD on December 2022 showed a normal esophagus, grossly normal stomach and duodenum. ? ?HPI ? ?  ? ?Home Medications ?Prior to Admission medications   ?Medication Sig Start Date End Date Taking? Authorizing Provider  ?acetaminophen (TYLENOL) 325 MG tablet Take 325 mg by mouth 2 (two) times daily as needed (pain).   Yes [provider]  ?amLODipine (NORVASC) 5 MG tablet Take 1 tablet (5 mg total) by mouth daily. For blood pressure. 04/27/21  Yes Pleas Koch, NP  ?aspirin EC 81 MG tablet Take 81 mg by mouth daily. Swallow whole.   Yes [provider]  ?atorvastatin (LIPITOR) 10 MG tablet Take 10 mg by mouth daily. 12/15/20 12/15/21 Yes [provider]  ?CALCIUM-VITAMIN D PO Take 2 tablets by mouth daily.   Yes [provider]  ?cephALEXin (KEFLEX) 500 MG capsule Take 1 capsule (500 mg total) by mouth 4 (four) times daily. 09/15/21  Yes Lucrezia Starch, MD  ?DULoxetine (CYMBALTA) 20 MG capsule Take 2 capsules (40 mg total) by mouth daily. For  anxiety/depression/pain ?Patient taking differently: Take 40 mg by mouth daily as needed (anxiety/depression). 06/27/21  Yes Pleas Koch, NP  ?metoprolol succinate (TOPROL-XL) 50 MG 24 hr tablet TAKE 1 TABLET DAILY FOR HEART RATE/PALPITATIONS ?Patient taking differently: Take 50 mg by mouth daily. 06/14/21  Yes Pleas Koch, NP  ?omeprazole (PRILOSEC) 40 MG capsule TAKE 1 CAPSULE DAILY. FOR HEARTBURN. ?Patient taking differently: Take 40 mg by mouth daily. 08/29/21  Yes Pleas Koch, NP  ?ondansetron (ZOFRAN-ODT) 4 MG disintegrating tablet Take 1 tablet (4 mg total) by mouth every 8 (eight) hours as needed for nausea or vomiting. 09/15/21  Yes Lucrezia Starch, MD  ?valsartan (DIOVAN) 160 MG tablet Take 160 mg by mouth daily.   Yes [provider]  ?acetaminophen (TYLENOL) 500 MG tablet Take 500-1,000 mg by mouth every 6 (six) hours as needed for moderate pain. ?Patient not taking: Reported on 09/15/2021    [provider]  ?   ? ?Allergies    ?Patient has no known allergies.   ? ?Review of Systems   ?Review of Systems  ?Constitutional:  Negative for chills and fever.  ?HENT:  Negative for ear pain and sore throat.   ?Eyes:  Negative for pain and visual disturbance.  ?Respiratory:  Negative for cough and shortness of breath.   ?Cardiovascular:  Negative for chest pain and palpitations.  ?Gastrointestinal:  Positive for abdominal pain, nausea and vomiting.  ?Genitourinary:  Negative for dysuria  and hematuria.  ?Musculoskeletal:  Positive for arthralgias. Negative for back pain.  ?Skin:  Negative for color change and rash.  ?Neurological:  Negative for seizures and syncope.  ?All other systems reviewed and are negative. ? ?Physical Exam ?Updated Vital Signs ?BP 140/78 (BP Location: Left Arm)   Pulse 80   Temp 98.1 ?F (36.7 ?C) (Oral)   Resp 16   Ht '5\' 5"'$  (1.651 m)   Wt 63.5 kg   SpO2 93%   BMI 23.30 kg/m?  ?Physical Exam ?Vitals and nursing note reviewed.  ?Constitutional:   ?    General: She is not in acute distress. ?   Appearance: She is well-developed.  ?HENT:  ?   Head: Normocephalic and atraumatic.  ?Eyes:  ?   Conjunctiva/sclera: Conjunctivae normal.  ?Cardiovascular:  ?   Rate and Rhythm: Normal rate and regular rhythm.  ?   Heart sounds: No murmur heard. ?Pulmonary:  ?   Effort: Pulmonary effort is normal. No respiratory distress.  ?   Breath sounds: Normal breath sounds.  ?Abdominal:  ?   Palpations: Abdomen is soft.  ?   Tenderness: There is generalized abdominal tenderness and tenderness in the epigastric area. There is no guarding or rebound.  ?Musculoskeletal:     ?   General: No swelling.  ?   Cervical back: Neck supple.  ?   Comments: The left leg appears normal, there is no significant swelling noted, there is some generalized tenderness to the thigh region.  The distal DP and PT pulses are intact bilaterally, sensation and motor intact distally in both legs.  ?Skin: ?   General: Skin is warm and dry.  ?   Capillary Refill: Capillary refill takes less than 2 seconds.  ?Neurological:  ?   Mental Status: She is alert.  ?Psychiatric:     ?   Mood and Affect: Mood normal.  ? ? ?ED Results / Procedures / Treatments   ?Labs ?(all labs ordered are listed, but only abnormal results are displayed) ?Labs Reviewed  ?LIPASE, BLOOD - Abnormal; Notable for the following components:  ?    Result Value  ? Lipase 56 (*)   ? All other components within normal limits  ?COMPREHENSIVE METABOLIC PANEL - Abnormal; Notable for the following components:  ? Potassium 3.4 (*)   ? Glucose, Bld 112 (*)   ? All other components within normal limits  ?URINALYSIS, ROUTINE W REFLEX MICROSCOPIC - Abnormal; Notable for the following components:  ? APPearance HAZY (*)   ? Hgb urine dipstick MODERATE (*)   ? Nitrite POSITIVE (*)   ? Leukocytes,Ua MODERATE (*)   ? Bacteria, UA MANY (*)   ? All other components within normal limits  ?CBC  ? ? ?EKG ?None ? ?Radiology ?CT ABDOMEN PELVIS W CONTRAST ? ?Result Date:  09/15/2021 ?CLINICAL DATA:  Abdominal pain, acute, nonlocalized EXAM: CT ABDOMEN AND PELVIS WITH CONTRAST TECHNIQUE: Multidetector CT imaging of the abdomen and pelvis was performed using the standard protocol following bolus administration of intravenous contrast. RADIATION DOSE REDUCTION: This exam was performed according to the departmental dose-optimization program which includes automated exposure control, adjustment of the mA and/or kV according to patient size and/or use of iterative reconstruction technique. CONTRAST:  158m OMNIPAQUE IOHEXOL 350 MG/ML SOLN COMPARISON:  02/15/2021 FINDINGS: Lower chest: No acute abnormality. Hepatobiliary: No focal liver abnormality is seen. Status post cholecystectomy. No biliary dilatation. Pancreas: Unremarkable. No pancreatic ductal dilatation or surrounding inflammatory changes. Spleen: Unremarkable. Adrenals/Urinary Tract: Adrenals are  unremarkable. Kidneys are unremarkable. The bladder is poorly distended but unremarkable. Stomach/Bowel: Stomach is within normal limits. Bowel is normal in caliber. Normal appendix. Vascular/Lymphatic: Mild atherosclerosis.  No enlarged nodes. Reproductive: Status post hysterectomy. No adnexal masses. Other: No free fluid.  Abdominal wall is unremarkable. Musculoskeletal: Lumbar spine degenerative changes superimposed on dextrocurvature. Spinal stimulator is no longer present. IMPRESSION: No acute abnormality. Electronically Signed   By: Macy Mis M.D.   On: 09/15/2021 14:48  ? ?DG Femur Min 2 Views Left ? ?Result Date: 09/15/2021 ?CLINICAL DATA:  Left femur pain. EXAM: LEFT FEMUR 2 VIEWS COMPARISON:  None. FINDINGS: There is no evidence of fracture. Small benign bone island incidentally noted in the proximal left femoral diaphysis. No other bone lesions identified. Soft tissues are unremarkable. IMPRESSION: No acute findings. Electronically Signed   By: Marlaine Hind M.D.   On: 09/15/2021 14:08  ? ?VAS [place] LOWER EXTREMITY VENOUS (DVT)  (ONLY [facility] & [facility]) ? ?Result Date: 09/15/2021 ? Lower Venous DVT Study Patient Name:  Valerie [name]  Date of Exam:   09/15/2021 Medical Rec #: [id number]     Accession #:    [id number] Date of Birth: 09-11-1950      Patient Gender: Jenell Milliner

## 2021-09-15 NOTE — ED Notes (Signed)
States she wants meds after ct, to ct via cart ?

## 2021-09-20 ENCOUNTER — Ambulatory Visit: Payer: Medicare HMO | Admitting: Gastroenterology

## 2021-09-20 ENCOUNTER — Encounter: Payer: Self-pay | Admitting: Gastroenterology

## 2021-09-20 VITALS — BP 126/84 | HR 80 | Temp 97.3°F | Wt 144.6 lb

## 2021-09-20 DIAGNOSIS — K219 Gastro-esophageal reflux disease without esophagitis: Secondary | ICD-10-CM | POA: Diagnosis not present

## 2021-09-20 DIAGNOSIS — R110 Nausea: Secondary | ICD-10-CM | POA: Diagnosis not present

## 2021-09-20 DIAGNOSIS — K5900 Constipation, unspecified: Secondary | ICD-10-CM

## 2021-09-20 NOTE — Progress Notes (Signed)
?  ?Jonathon Bellows MD, MRCP(U.K) ?[address]  ?[address]  ?[place], [place] [address]  ?Main: [phone]  ?Fax: [phone] ? ? ?Primary Care Physician: Pleas Koch, NP ? ?Primary Gastroenterologist:  Dr. Jonathon Bellows  ? ?Chief Complaint  ?Patient presents with  ?? Abdominal Pain  ? ? ?HPI: Valerie [name] is a 71 y.o. female ? ? ?Summary of history : ?This patient transferred care to me on 06/29/2021.  She has been previously seen Dr. Bonna Gains last on 05/12/2021 for epigastric pain, treated with PPI has heartburn., constipation on Linzess 145 mcg a day.  Previously seen by another gastroenterologist.  In September 2020 to have partial small bowel obstruction conservatively treated.  History of cholecystectomy.  Last colonoscopy in March 2022 by Dr. Allen Norris that showed internal hemorrhoids and a repeat was recommended in 10 years.  Prior history of pH study, esophageal manometry in 2017 and  felt that she had functional heartburn and treated with a trial of desipramine. ?  ?In 2005 EGD showed esophagitis grade A ?05/17/2021: EGD: Inflammation seen in the gastric antrum and esophagus appeared normal.  ? ?Interval history 06/29/2021-09/20/2021 ? ?At the last visit saw me for chronic nausea and abdominal discomfort. ? ?Since then she was 145 mcg Linzess caused her to have diarrhea and stopped taking it.  Having stool like hard pellets.  She takes Protonix twice a day.  Still having chronic nausea throughout the day.  No other symptoms. ? ?Recent visit to the ER for abdominal discomfort diagnosed with a UTI CT scan of the abdomen pelvis showed no gross abnormalities. ? ?At that time she was having epigastric pain nausea and loose stools. ?  ?She has cut down on soda consumption. ? ?Current Outpatient Medications  ?Medication Sig Dispense Refill  ?? acetaminophen (TYLENOL) 325 MG tablet Take 325 mg by mouth 2 (two) times daily as needed (pain).    ?? acetaminophen (TYLENOL) 500 MG tablet Take 500-1,000 mg by mouth every  6 (six) hours as needed for moderate pain.    ?? amLODipine (NORVASC) 5 MG tablet Take 1 tablet (5 mg total) by mouth daily. For blood pressure. 90 tablet 3  ?? aspirin EC 81 MG tablet Take 81 mg by mouth daily. Swallow whole.    ?? atorvastatin (LIPITOR) 10 MG tablet Take 10 mg by mouth daily.    ?? CALCIUM-VITAMIN D PO Take 2 tablets by mouth daily.    ?? cephALEXin (KEFLEX) 500 MG capsule Take 1 capsule (500 mg total) by mouth 4 (four) times daily. 20 capsule 0  ?? DULoxetine (CYMBALTA) 20 MG capsule Take 2 capsules (40 mg total) by mouth daily. For anxiety/depression/pain (Patient taking differently: Take 40 mg by mouth daily as needed (anxiety/depression).) 180 capsule 0  ?? metoprolol succinate (TOPROL-XL) 50 MG 24 hr tablet TAKE 1 TABLET DAILY FOR HEART RATE/PALPITATIONS (Patient taking differently: Take 50 mg by mouth daily.) 90 tablet 3  ?? omeprazole (PRILOSEC) 40 MG capsule TAKE 1 CAPSULE DAILY. FOR HEARTBURN. (Patient taking differently: Take 40 mg by mouth daily.) 90 capsule 2  ?? ondansetron (ZOFRAN-ODT) 4 MG disintegrating tablet Take 1 tablet (4 mg total) by mouth every 8 (eight) hours as needed for nausea or vomiting. 20 tablet 0  ?? valsartan (DIOVAN) 160 MG tablet Take 160 mg by mouth daily.    ? ?No current facility-administered medications for this visit.  ? ? ?Allergies as of 09/20/2021  ?? (No Known Allergies)  ? ? ?ROS: ? ?General: Negative for  anorexia, weight loss, fever, chills, fatigue, weakness. ?ENT: Negative for hoarseness, difficulty swallowing , nasal congestion. ?CV: Negative for chest pain, angina, palpitations, dyspnea on exertion, peripheral edema.  ?Respiratory: Negative for dyspnea at rest, dyspnea on exertion, cough, sputum, wheezing.  ?GI: See history of present illness. ?GU:  Negative for dysuria, hematuria, urinary incontinence, urinary frequency, nocturnal urination.  ?Endo: Negative for unusual weight change.  ?  ?Physical Examination: ? ? BP 126/84   Pulse 80   Temp  (!) 97.3 ?F (36.3 ?C) (Oral)   Wt 144 lb 9.6 oz (65.6 kg)   BMI 24.06 kg/m?  ? ?General: Well-nourished, well-developed in no acute distress.  ?Eyes: No icterus. Conjunctivae pink. ?Mouth: Oropharyngeal mucosa moist and pink , no lesions erythema or exudate. ?Neuro: Alert and oriented x 3.  Grossly intact. ?Skin: Warm and dry, no jaundice.   ?Psych: Alert and cooperative, normal mood and affect. ? ? ?Imaging Studies: ?CT ABDOMEN PELVIS W CONTRAST ? ?Result Date: 09/15/2021 ?CLINICAL DATA:  Abdominal pain, acute, nonlocalized EXAM: CT ABDOMEN AND PELVIS WITH CONTRAST TECHNIQUE: Multidetector CT imaging of the abdomen and pelvis was performed using the standard protocol following bolus administration of intravenous contrast. RADIATION DOSE REDUCTION: This exam was performed according to the departmental dose-optimization program which includes automated exposure control, adjustment of the mA and/or kV according to patient size and/or use of iterative reconstruction technique. CONTRAST:  124m OMNIPAQUE IOHEXOL 350 MG/ML SOLN COMPARISON:  02/15/2021 FINDINGS: Lower chest: No acute abnormality. Hepatobiliary: No focal liver abnormality is seen. Status post cholecystectomy. No biliary dilatation. Pancreas: Unremarkable. No pancreatic ductal dilatation or surrounding inflammatory changes. Spleen: Unremarkable. Adrenals/Urinary Tract: Adrenals are unremarkable. Kidneys are unremarkable. The bladder is poorly distended but unremarkable. Stomach/Bowel: Stomach is within normal limits. Bowel is normal in caliber. Normal appendix. Vascular/Lymphatic: Mild atherosclerosis.  No enlarged nodes. Reproductive: Status post hysterectomy. No adnexal masses. Other: No free fluid.  Abdominal wall is unremarkable. Musculoskeletal: Lumbar spine degenerative changes superimposed on dextrocurvature. Spinal stimulator is no longer present. IMPRESSION: No acute abnormality. Electronically Signed   By: PMacy MisM.D.   On: 09/15/2021  14:48  ? ?DG Femur Min 2 Views Left ? ?Result Date: 09/15/2021 ?CLINICAL DATA:  Left femur pain. EXAM: LEFT FEMUR 2 VIEWS COMPARISON:  None. FINDINGS: There is no evidence of fracture. Small benign bone island incidentally noted in the proximal left femoral diaphysis. No other bone lesions identified. Soft tissues are unremarkable. IMPRESSION: No acute findings. Electronically Signed   By: JMarlaine HindM.D.   On: 09/15/2021 14:08  ? ?VAS UKoreaLOWER EXTREMITY VENOUS (DVT) (ONLY [facility] & [facility]) ? ?Result Date: 09/15/2021 ? Lower Venous DVT Study Patient Name:  Valerie [name] Date of Exam:   09/15/2021 Medical Rec #: [id number]    Accession #:    [id number] of Birth: 4September 07, 1952     Patient Gender: F Patient Age:   89years [facility]      VAS [place]OWER EXTREMITY VENOUS (DVT) Referring Phys: RMadalyn Rob--------------------------------------------------------------------------------  Indications: Left leg pain.  Comparison Study: No prior studies. Performing Technologist: RDarlin CocoRDMS, RVT  Examination Guidelines: A complete evaluation includes B-mode imaging, spectral Doppler, color Doppler, and power Doppler as needed of all accessible portions of each vessel. Bilateral testing is considered an integral part of a complete examination. Limited examinations for reoccurring indications may be performed as noted. The reflux portion of the exam is performed with the patient in  reverse Trendelenburg.  +-----+---------------+---------+-----------+----------+--------------+ RIGHTCompressibilityPhasicitySpontaneityPropertiesThrombus Aging +-----+---------------+---------+-----------+----------+--------------+ CFV  Full           Yes      Yes                                 +-----+---------------+---------+-----------+----------+--------------+   +---------+---------------+---------+-----------+----------+--------------+ LEFT      CompressibilityPhasicitySpontaneityPropertiesThrombus Aging +---------+---------------+---------+-----------+----------+--------------+ CFV      Full           Yes      Yes                                 +---------+---------------+---------+-----------+----------+-------

## 2021-09-20 NOTE — Patient Instructions (Signed)
Low-FODMAP Eating Plan  FODMAP stands for fermentable oligosaccharides, disaccharides, monosaccharides, and polyols. These are sugars that are hard for some people to digest. A low-FODMAP eating plan may help some people who have irritable bowel syndrome (IBS) and certain other bowel (intestinal) diseases to manage their symptoms. This meal plan can be complicated to follow. Work with a diet and nutrition specialist (dietitian) to make a low-FODMAP eating plan that is right for you. A dietitian can help make sure that you get enough nutrition from this diet. What are tips for following this plan? Reading food labels Check labels for hidden FODMAPs such as: High-fructose syrup. Honey. Agave. Natural fruit flavors. Onion or garlic powder. Choose low-FODMAP foods that contain 3-4 grams of fiber per serving. Check food labels for serving sizes. Eat only one serving at a time to make sure FODMAP levels stay low. Shopping Shop with a list of foods that are recommended on this diet and make a meal plan. Meal planning Follow a low-FODMAP eating plan for up to 6 weeks, or as told by your health care provider or dietitian. To follow the eating plan: Eliminate high-FODMAP foods from your diet completely. Choose only low-FODMAP foods to eat. You will do this for 2-6 weeks. Gradually reintroduce high-FODMAP foods into your diet one at a time. Most people should wait a few days before introducing the next new high-FODMAP food into their meal plan. Your dietitian can recommend how quickly you may reintroduce foods. Keep a daily record of what and how much you eat and drink. Make note of any symptoms that you have after eating. Review your daily record with a dietitian regularly to identify which foods you can eat and which foods you should avoid. General tips Drink enough fluid each day to keep your urine pale yellow. Avoid processed foods. These often have added sugar and may be high in FODMAPs. Avoid  most dairy products, whole grains, and sweeteners. Work with a dietitian to make sure you get enough fiber in your diet. Avoid high FODMAP foods at meals to manage symptoms. Recommended foods Fruits Bananas, oranges, tangerines, lemons, limes, blueberries, raspberries, strawberries, grapes, cantaloupe, honeydew melon, kiwi, papaya, passion fruit, and pineapple. Limited amounts of dried cranberries, banana chips, and shredded coconut. Vegetables Eggplant, zucchini, cucumber, peppers, green beans, bean sprouts, lettuce, arugula, kale, Swiss chard, spinach, collard greens, bok choy, summer squash, potato, and tomato. Limited amounts of corn, carrot, and sweet potato. Green parts of scallions. Grains Gluten-free grains, such as rice, oats, buckwheat, quinoa, corn, polenta, and millet. Gluten-free pasta, bread, or cereal. Rice noodles. Corn tortillas. Meats and other proteins Unseasoned beef, pork, poultry, or fish. Eggs. Bacon. Tofu (firm) and tempeh. Limited amounts of nuts and seeds, such as almonds, walnuts, brazil nuts, pecans, peanuts, nut butters, pumpkin seeds, chia seeds, and sunflower seeds. Dairy Lactose-free milk, yogurt, and kefir. Lactose-free cottage cheese and ice cream. Non-dairy milks, such as almond, coconut, hemp, and rice milk. Non-dairy yogurt. Limited amounts of goat cheese, brie, mozzarella, parmesan, swiss, and other hard cheeses. Fats and oils Butter-free spreads. Vegetable oils, such as olive, canola, and sunflower oil. Seasoning and other foods Artificial sweeteners with names that do not end in "ol," such as aspartame, saccharine, and stevia. Maple syrup, white table sugar, raw sugar, brown sugar, and molasses. Mayonnaise, soy sauce, and tamari. Fresh basil, coriander, parsley, rosemary, and thyme. Beverages Water and mineral water. Sugar-sweetened soft drinks. Small amounts of orange juice or cranberry juice. Black and green tea. Most dry wines.   Coffee. The items listed  above may not be a complete list of foods and beverages you can eat. Contact a dietitian for more information. Foods to avoid Fruits Fresh, dried, and juiced forms of apple, pear, watermelon, peach, plum, cherries, apricots, blackberries, boysenberries, figs, nectarines, and mango. Avocado. Vegetables Chicory root, artichoke, asparagus, cabbage, snow peas, Brussels sprouts, broccoli, sugar snap peas, mushrooms, celery, and cauliflower. Onions, garlic, leeks, and the white part of scallions. Grains Wheat, including kamut, durum, and semolina. Barley and bulgur. Couscous. Wheat-based cereals. Wheat noodles, bread, crackers, and pastries. Meats and other proteins Fried or fatty meat. Sausage. Cashews and pistachios. Soybeans, baked beans, black beans, chickpeas, kidney beans, fava beans, navy beans, lentils, black-eyed peas, and split peas. Dairy Milk, yogurt, ice cream, and soft cheese. Cream and sour cream. Milk-based sauces. Custard. Buttermilk. Soy milk. Seasoning and other foods Any sugar-free gum or candy. Foods that contain artificial sweeteners such as sorbitol, mannitol, isomalt, or xylitol. Foods that contain honey, high-fructose corn syrup, or agave. Bouillon, vegetable stock, beef stock, and chicken stock. Garlic and onion powder. Condiments made with onion, such as hummus, chutney, pickles, relish, salad dressing, and salsa. Tomato paste. Beverages Chicory-based drinks. Coffee substitutes. Chamomile tea. Fennel tea. Sweet or fortified wines such as port or sherry. Diet soft drinks made with isomalt, mannitol, maltitol, sorbitol, or xylitol. Apple, pear, and mango juice. Juices with high-fructose corn syrup. The items listed above may not be a complete list of foods and beverages you should avoid. Contact a dietitian for more information. Summary FODMAP stands for fermentable oligosaccharides, disaccharides, monosaccharides, and polyols. These are sugars that are hard for some people to  digest. A low-FODMAP eating plan is a short-term diet that helps to ease symptoms of certain bowel diseases. The eating plan usually lasts up to 6 weeks. After that, high-FODMAP foods are reintroduced gradually and one at a time. This can help you find out which foods may be causing symptoms. A low-FODMAP eating plan can be complicated. It is best to work with a dietitian who has experience with this type of plan. This information is not intended to replace advice given to you by your health care provider. Make sure you discuss any questions you have with your health care provider. Document Revised: 10/02/2019 Document Reviewed: 10/02/2019 Elsevier Patient Education  2023 Elsevier Inc.  

## 2021-09-23 ENCOUNTER — Encounter: Payer: Self-pay | Admitting: *Deleted

## 2021-10-09 ENCOUNTER — Other Ambulatory Visit: Payer: Self-pay

## 2021-10-09 DIAGNOSIS — I10 Essential (primary) hypertension: Secondary | ICD-10-CM

## 2021-10-09 MED ORDER — ATORVASTATIN CALCIUM 10 MG PO TABS: 10.0000 mg | ORAL_TABLET | Freq: Every day | ORAL | 1 refills | Status: DC

## 2021-10-09 MED ORDER — AMLODIPINE BESYLATE 5 MG PO TABS: 5.0000 mg | ORAL_TABLET | Freq: Every day | ORAL | 1 refills | Status: DC

## 2021-10-09 NOTE — Telephone Encounter (Signed)
Received refill request from [facility] via fax. For medications listed below. Did not see where you had refilled in the past.  ? ?Atorvastatin ?Amlodipine  ?

## 2021-10-27 ENCOUNTER — Telehealth: Payer: Self-pay

## 2021-10-27 DIAGNOSIS — N3289 Other specified disorders of bladder: Secondary | ICD-10-CM | POA: Diagnosis not present

## 2021-10-27 DIAGNOSIS — Z79891 Long term (current) use of opiate analgesic: Secondary | ICD-10-CM | POA: Diagnosis not present

## 2021-10-27 DIAGNOSIS — Z8616 Personal history of COVID-19: Secondary | ICD-10-CM | POA: Diagnosis not present

## 2021-10-27 DIAGNOSIS — R1084 Generalized abdominal pain: Secondary | ICD-10-CM | POA: Diagnosis not present

## 2021-10-27 DIAGNOSIS — R1010 Upper abdominal pain, unspecified: Secondary | ICD-10-CM | POA: Diagnosis not present

## 2021-10-27 DIAGNOSIS — R112 Nausea with vomiting, unspecified: Secondary | ICD-10-CM | POA: Diagnosis not present

## 2021-10-27 DIAGNOSIS — Z9049 Acquired absence of other specified parts of digestive tract: Secondary | ICD-10-CM | POA: Diagnosis not present

## 2021-10-27 DIAGNOSIS — Z79899 Other long term (current) drug therapy: Secondary | ICD-10-CM | POA: Diagnosis not present

## 2021-10-27 NOTE — Progress Notes (Signed)
    Chronic Care Management Pharmacy Assistant   Name: Valerie [name]  MRN: [id number] DOB: [birth year]  Reason for Encounter: CCM (General Adherence)  Recent office visits:  None since last CCM contact  Recent consult visits:  09/20/21 Jonathon Bellows, MD [facility]) Constipation Start: OTC Pepcid 40 mg. Restart: Gabapentin. Change: Linzess to 72 mg daily Referral to GI at [facility] Internal FU 2 months  Hospital visits:  [facility]/2021 [facility] Discharged same day Final Diagnoses: UTI  Start: KEFLEX 500 MG  Start: ZOFRAN-ODT 4 MG disintegrating tablet  Medications: Outpatient Encounter Medications as of 10/27/2021  Medication Sig   acetaminophen (TYLENOL) 325 MG tablet Take 325 mg by mouth 2 (two) times daily as needed (pain).   acetaminophen (TYLENOL) 500 MG tablet Take 500-1,000 mg by mouth every 6 (six) hours as needed for moderate pain.   amLODipine (NORVASC) 5 MG tablet Take 1 tablet (5 mg total) by mouth daily. For blood pressure.   aspirin EC 81 MG tablet Take 81 mg by mouth daily. Swallow whole.   atorvastatin (LIPITOR) 10 MG tablet Take 1 tablet (10 mg total) by mouth daily. for cholesterol.   CALCIUM-VITAMIN D PO Take 2 tablets by mouth daily.   cephALEXin (KEFLEX) 500 MG capsule Take 1 capsule (500 mg total) by mouth 4 (four) times daily.   DULoxetine (CYMBALTA) 20 MG capsule Take 2 capsules (40 mg total) by mouth daily. For anxiety/depression/pain (Patient taking differently: Take 40 mg by mouth daily as needed (anxiety/depression).)   metoprolol succinate (TOPROL-XL) 50 MG 24 hr tablet TAKE 1 TABLET DAILY FOR HEART RATE/PALPITATIONS (Patient taking differently: Take 50 mg by mouth daily.)   omeprazole (PRILOSEC) 40 MG capsule TAKE 1 CAPSULE DAILY. FOR HEARTBURN. (Patient taking differently: Take 40 mg by mouth daily.)   ondansetron (ZOFRAN-ODT) 4 MG disintegrating tablet Take 1 tablet (4 mg total) by mouth every 8 (eight) hours as needed for nausea or vomiting.   valsartan  (DIOVAN) 160 MG tablet Take 160 mg by mouth daily.   No facility-administered encounter medications on file as of 10/27/2021.   Attempted contact with patient 3 times. Unsuccessful outreach. Will atttempt contact next month.  Patient is not more than 5 days past due for refill on the following medications per chart history:  [facility] Medications: Medication Name/mg Last Fill Days Supply Atorvastatin 10 mg  10/10/2021 90 Valsartan 160 mg  08/26/2021 90  Care Gaps: Annual wellness visit in last year? Yes 06/27/2021 Most Recent BP reading: 126/84 on 09/20/2021  Upcoming appointments: CCM appointment on 02/17/2022  [name], CPP notified  [name], [place] Assistant [phone]

## 2021-11-09 ENCOUNTER — Other Ambulatory Visit
Admission: RE | Admit: 2021-11-09 | Discharge: 2021-11-09 | Disposition: A | Discharge status: Home / Self Care | Payer: Medicare HMO | Attending: Urology | Admitting: Urology

## 2021-11-09 ENCOUNTER — Ambulatory Visit: Payer: Medicare HMO | Admitting: Urology

## 2021-11-09 ENCOUNTER — Encounter: Payer: Self-pay | Admitting: Urology

## 2021-11-09 ENCOUNTER — Other Ambulatory Visit: Payer: Self-pay | Admitting: *Deleted

## 2021-11-09 VITALS — BP 122/66 | HR 70 | Ht 65.0 in | Wt 141.2 lb

## 2021-11-09 DIAGNOSIS — R1084 Generalized abdominal pain: Secondary | ICD-10-CM | POA: Diagnosis not present

## 2021-11-09 DIAGNOSIS — N390 Urinary tract infection, site not specified: Secondary | ICD-10-CM | POA: Diagnosis not present

## 2021-11-09 DIAGNOSIS — N3289 Other specified disorders of bladder: Secondary | ICD-10-CM | POA: Diagnosis not present

## 2021-11-09 DIAGNOSIS — Z87440 Personal history of urinary (tract) infections: Secondary | ICD-10-CM | POA: Diagnosis not present

## 2021-11-09 DIAGNOSIS — R110 Nausea: Secondary | ICD-10-CM

## 2021-11-09 LAB — URINALYSIS, COMPLETE (UACMP) WITH MICROSCOPIC
Glucose, UA: NEGATIVE mg/dL
Nitrite: POSITIVE — AB
Protein, ur: 30 mg/dL — AB
Specific Gravity, Urine: >1.03 — ABNORMAL HIGH (ref 1.005–1.030)
pH: 5.5 (ref 5.0–8.0)

## 2021-11-09 MED ORDER — SULFAMETHOXAZOLE-TRIMETHOPRIM 800-160 MG PO TABS: 1.0000 | ORAL_TABLET | Freq: Two times a day (BID) | ORAL | 0 refills | Status: DC

## 2021-11-09 NOTE — Patient Instructions (Signed)
Irritable Bowel Syndrome, Adult  Irritable bowel syndrome (IBS) is a group of symptoms that affects the organs responsible for digestion (gastrointestinal tract, or GI tract). IBS is not one specific disease. To regulate how the GI tract works, the body sends signals back and forth between the intestines and the brain. If you have IBS, there may be a problem with these signals. As a result, the GI tract does not function normally. The intestines may become more sensitive and overreact to certain things. This may be especially true when you eat certain foods or when you are under stress. There are four main types of IBS. These may be determined based on the consistency of your stool (feces): IBS with mostly (predominance of) diarrhea. IBS with predominance of constipation. IBS with mixed bowel habits. This includes both diarrhea and constipation. IBS unclassified. This includes IBS that cannot be categorized into one of the other three main types. It is important to know which type of IBS you have. Certain treatments are more likely to be helpful for certain types of IBS. What are the causes? The exact cause of IBS is not known. What increases the risk? You may have a higher risk for IBS if you: Are female. Are younger than 40 years. Have a family history of IBS. Have a mental health condition, such as depression, anxiety, or post-traumatic stress disorder. Have had a bacterial infection of your GI tract. What are the signs or symptoms? Symptoms of IBS vary from person to person. The main symptom is abdominal pain or discomfort. Other symptoms usually include one or more of the following: Diarrhea, constipation, or both. Swelling or bloating in the abdomen. Feeling full after eating a small or regular-sized meal. Frequent gas. Mucus in the stool. A feeling of having more stool left after a bowel movement. Symptoms tend to come and go. They may be triggered by stress, mental health  conditions, or certain foods. How is this diagnosed? This condition may be diagnosed based on a physical exam, your medical history, and your symptoms. You may have tests, such as: Blood tests. Stool test. Colonoscopy. This is a procedure in which your GI tract is viewed with a long, thin, flexible tube. How is this treated? There is no cure for IBS, but treatment can help relieve symptoms. Treatment depends on the type of IBS you have, and may include: Changes to your diet, such as: Avoiding foods that cause symptoms. Drinking more water. Following a low-FODMAP (fermentable oligosaccharides, disaccharides, monosaccharides, and polyols) diet for up to 6 weeks, or as told by your health care provider. FODMAPs are sugars that are hard for some people to digest. Eating more fiber. Eating small meals at the same times every day. Medicines. These may include: Fiber supplements, if you have constipation. Medicine to control diarrhea (antidiarrheal medicines). Medicine to help control muscle tightening (spasms) in your GI tract (antispasmodic medicines). Medicines to help with mental health conditions, such as antidepressants. Talk therapy or counseling. Working with a dietitian to help create a food plan that is right for you. Managing your stress. Follow these instructions at home: Eating and drinking  Eat a healthy diet. Eat 5-6 small meals a day. Try to eat meals at about the same times each day. Do not eat large meals. Gradually eat more fiber-rich foods. These include whole grains, fruits, and vegetables. This may be especially helpful if you have IBS with constipation. Eat a diet low in FODMAPs. You may need to avoid foods such as   citrus fruits, cabbage, garlic, and onions. Drink enough fluid to keep your urine pale yellow. Keep a journal of foods that seem to trigger symptoms. Avoid foods and drinks that: Contain added sugar. Make your symptoms worse. These may include dairy  products, caffeinated drinks, and carbonated drinks. Alcohol use Do not drink alcohol if: Your health care provider tells you not to drink. You are pregnant, may be pregnant, or are planning to become pregnant. If you drink alcohol: Limit how much you have to: 0-1 drink a day for women. 0-2 drinks a day for men. Know how much alcohol is in your drink. In the [place] one drink equals one 12 oz bottle of beer (355 mL), one 5 oz glass of wine (148 mL), or one 1 oz glass of hard liquor (44 mL) General instructions Take over-the-counter and prescription medicines only as told by your health care provider. This includes supplements. Get enough exercise. Do at least 150 minutes of moderate-intensity exercise each week. Manage your stress. Getting enough sleep and exercise can help you manage stress. Keep all follow-up visits. This is important. This includes all visits with your health care provider and therapist. Where to find more information International Foundation for Functional Gastrointestinal Disorders: aboutibs.[employer] of Diabetes and Digestive and Kidney Diseases: [employer]is Contact a health care provider if: You have constant pain. You lose weight. You have diarrhea that gets worse. You have bleeding from the rectum. You vomit often. You have a fever. Get help right away if: You have severe abdominal pain. You have diarrhea with symptoms of dehydration, such as dizziness or dry mouth. You have bloody or black stools. You have severe abdominal bloating. You have vomiting that does not stop. You have blood in your vomit. Summary Irritable bowel syndrome (IBS) is not one specific disease. It is a group of symptoms that affects digestion. Your intestines may become more sensitive and overreact to certain things. This may be especially true when you eat certain foods or when you are under stress. There is no cure for IBS, but treatment can help relieve  symptoms. This information is not intended to replace advice given to you by your health care provider. Make sure you discuss any questions you have with your health care provider. Document Revised: 04/27/2021 Document Reviewed: 04/27/2021 Elsevier Patient Education  [place] for Irritable Bowel Syndrome When you have irritable bowel syndrome (IBS), it is very important to follow the eating habits that are best for your condition. IBS may cause various symptoms, such as pain in the abdomen, constipation, or diarrhea. Choosing the right foods can help to ease the discomfort from these symptoms. Work with your health care provider and dietitian to find the eating plan that will help to control your symptoms. What are tips for following this plan?  Keep a food diary. This will help you identify foods that cause symptoms. Write down: What you eat and when you eat it. What symptoms you have. When symptoms occur in relation to your meals, such as "pain in abdomen 2 hours after dinner." Eat your meals slowly and in a relaxed setting. Aim to eat 5-6 small meals per day. Do not skip meals. Drink enough fluid to keep your urine pale yellow. Ask your health care provider if you should take an over-the-counter probiotic to help restore healthy bacteria in your gut (digestive tract). Probiotics are foods that contain good bacteria and yeasts. Your dietitian may have specific dietary recommendations for you based  on your symptoms. Your dietitian may recommend that you: Avoid foods that cause symptoms. Talk with your dietitian about other ways to get the same nutrients that are in those problem foods. Avoid foods with gluten. Gluten is a protein that is found in rye, wheat, and barley. Eat more foods that contain soluble fiber. Examples of foods with high soluble fiber include oats, seeds, and certain fruits and vegetables. Take a fiber supplement if told by your dietitian. Reduce or avoid  certain foods called FODMAPs. These are foods that contain sugars that are hard for some people to digest. Ask your health care provider which foods to avoid. What foods should I avoid? The following are some foods and drinks that may make your symptoms worse: Fatty foods, such as french fries. Foods that contain gluten, such as pasta and cereal. Dairy products, such as milk, cheese, and ice cream. Spicy foods. Alcohol. Products with caffeine, such as coffee, tea, or chocolate. Carbonated drinks, such as soda. Foods that are high in FODMAPs. These include certain fruits and vegetables. Products with sweeteners such as honey, high fructose corn syrup, sorbitol, and mannitol. The items listed above may not be a complete list of foods and beverages you should avoid. Contact a dietitian for more information. What foods are good sources of fiber? Your health care provider or dietitian may recommend that you eat more foods that contain fiber. Fiber can help to reduce constipation and other IBS symptoms. Add foods with fiber to your diet a little at a time so your body can get used to them. Too much fiber at one time might cause gas and swelling of your abdomen. The following are some foods that are good sources of fiber: Berries, such as raspberries, strawberries, and blueberries. Tomatoes. Carrots. Brown rice. Oats. Seeds, such as chia and pumpkin seeds. The items listed above may not be a complete list of recommended sources of fiber. Contact your dietitian for more options. Where to find more information International Foundation for Functional Gastrointestinal Disorders: aboutibs.[employer] of Diabetes and Digestive and Kidney Diseases: [employer].is Summary When you have irritable bowel syndrome (IBS), it is very important to follow the eating habits that are best for your condition. IBS may cause various symptoms, such as pain in the abdomen, constipation, or diarrhea. Choosing  the right foods can help to ease the discomfort that comes from symptoms. Your health care provider or dietitian may recommend that you eat more foods that contain fiber. Keep a food diary. This will help you identify foods that cause symptoms. This information is not intended to replace advice given to you by your health care provider. Make sure you discuss any questions you have with your health care provider. Document Revised: 04/26/2021 Document Reviewed: 04/26/2021 Elsevier Patient Education  [place].

## 2021-11-09 NOTE — Progress Notes (Signed)
   11/09/2021 11:50 AM   Valerie [name] 1950-10-22 [id number]  Reason for visit: Bladder wall thickening on CT, recurrent UTIs, abdominal pain, nausea  HPI: I saw Valerie [name] and her husband today for the above issues.  He helps provide some of the history today.  We have followed her extensively previously for microscopic hematuria as well as recurrent UTIs, and she has been on topical estrogen cream and cranberry tablet prophylaxis.  She had a normal cystoscopy in September 2022, as well as a normal CT abdomen pelvis with contrast on 09/15/2021.  I personally viewed and interpreted those images.  She was recently seen at the [facility] ER on 10/27/2021 for nausea and abdominal pain, and CT abdomen pelvis with contrast at that time reportedly showed mild urothelial bladder wall thickening, but no other acute findings.  I am unable to personally review those films.  Urinalysis at that time showed trace leukocytes, 10 RBCs, 2 WBCs, no squamous cells, and was not sent for culture.  She also reports some pelvic pressure, sensation of incomplete emptying, and some bladder discomfort.  Urinalysis today appears grossly infected with 0-5 squamous cells, 6-10 WBCs, small leukocytes, nitrite positive, 11-20 RBCs, many bacteria.  Sent for culture and atypicals.  She has been evaluated extensively by GI for her abdominal pain and nausea without any definitive answers.  We had a long conversation about possible etiology of her symptoms.  Certainly UTI could cause some of the nausea and abdominal pain, and I recommended a course of antibiotics, and Bactrim was sent to her pharmacy.  We will follow-up culture results.  With her prior normal CT from April 2023 as well as normal cystoscopy in September I do not think she warrants further evaluation with a repeat cystoscopy.  We discussed possible other etiologies of her abdominal pain and nausea including constipation, IBS, idiopathic.  I recommended considering probiotics or an IBS  type diet if she gets no improvement in her abdominal pain and nausea after course of antibiotics.  -Bactrim DS twice daily for suspected UTI, follow-up cultures -Recommend continuing topical estrogen cream and cranberry tablets for UTI prevention -RTC 1 to 2 months symptom check, consider re-referral to GI if persistent abdominal pain and nausea  Billey Co, MD  [place] [address], [place], [place] [address] [phone]

## 2021-11-10 ENCOUNTER — Other Ambulatory Visit: Payer: Self-pay | Admitting: Urology

## 2021-11-10 ENCOUNTER — Ambulatory Visit
Admission: RE | Admit: 2021-11-10 | Discharge: 2021-11-10 | Disposition: A | Discharge status: Home / Self Care | Payer: Self-pay | Source: Ambulatory Visit | Attending: Urology | Admitting: Urology

## 2021-11-10 DIAGNOSIS — R1084 Generalized abdominal pain: Secondary | ICD-10-CM

## 2021-11-11 LAB — URINE CULTURE: Culture: >=100000 — AB

## 2021-11-15 ENCOUNTER — Telehealth: Payer: Self-pay | Admitting: Urology

## 2021-11-15 DIAGNOSIS — B370 Candidal stomatitis: Secondary | ICD-10-CM

## 2021-11-15 LAB — MISC LABCORP TEST (SEND OUT): Labcorp test code: 86884

## 2021-11-15 MED ORDER — NYSTATIN 100000 UNIT/ML MT SUSP: 15.0000 mL | Freq: Three times a day (TID) | OROMUCOSAL | 0 refills | Status: DC

## 2021-11-15 MED ORDER — FLUCONAZOLE 100 MG PO TABS: ORAL_TABLET | ORAL | 0 refills | Status: DC

## 2021-11-15 NOTE — Telephone Encounter (Signed)
Pt called office and NO pharmacy in town has the [employer] features [employer]".  It's all on backorder.

## 2021-11-15 NOTE — Telephone Encounter (Signed)
Called pt to inform her medication sent in for oral thrush, line disconnected. Unable to leave message. 1st attempt. RX sent.

## 2021-11-15 NOTE — Telephone Encounter (Signed)
Pt saw [facility] last week and has been taken Bactrim.  She thinks she has thrush in her mouth.

## 2021-11-15 NOTE — Telephone Encounter (Signed)
Please advise on alternative medication.

## 2021-11-15 NOTE — Telephone Encounter (Signed)
RX sent

## 2021-11-15 NOTE — Telephone Encounter (Signed)
Recommend fluconazole 200 mg p.o. first day, then 100 mg daily x13 days for 2-week course total  Nickolas Madrid, MD 11/15/2021

## 2021-11-15 NOTE — Addendum Note (Signed)
Addended by: Donalee Citrin on: 11/15/2021 11:19 AM   Modules accepted: Orders

## 2021-11-15 NOTE — Telephone Encounter (Signed)
Okay to send in Magic mouthwash for thrush  Nickolas Madrid, MD 11/15/2021

## 2021-11-15 NOTE — Addendum Note (Signed)
Addended by: Donalee Citrin on: 11/15/2021 04:06 PM   Modules accepted: Orders

## 2021-11-15 NOTE — Telephone Encounter (Signed)
Do you want to see patient in office or ok to treat based on symptoms? Please advise.

## 2021-11-21 ENCOUNTER — Ambulatory Visit: Payer: Medicare HMO | Admitting: Gastroenterology

## 2021-11-21 ENCOUNTER — Telehealth: Payer: Self-pay | Admitting: Urology

## 2021-11-22 NOTE — Telephone Encounter (Signed)
Thank you for dropping off the CT disc.  I was able to review this, and everything looks okay from my perspective.  The kidneys are normal with no kidney stones or blockage.  Bladder overall looks essentially normal, may be some very subtle inflammation consistent with urinary tract infection, but no worrisome findings that I think warrant cystoscopy or repeat imaging.   Hope you are feeling better on the antibiotics, your urine culture and sensitivity results are still pending, and we will call if antibiotics need to be changed.   If you continue to have nausea and abdominal pain despite the course of antibiotics, would recommend trying a probiotic or an IBS type diet as we discussed in clinic.   Legrand Rams, MD 11/11/2021   Called pt informed her of the information above. Pt voiced understanding.

## 2021-11-28 ENCOUNTER — Telehealth: Payer: Self-pay | Admitting: Urology

## 2021-11-28 ENCOUNTER — Telehealth: Payer: Self-pay

## 2021-11-28 NOTE — Telephone Encounter (Signed)
Called pt advised her of urine cx results and treatment as well as atypical cultures being negative. Pt voiced understanding, however states she is still having persistent dysuria, nausea, and fever. Pt scheduled to come in for UA and assessment in next available.

## 2021-11-28 NOTE — Telephone Encounter (Signed)
Please advise on urine cx

## 2021-11-28 NOTE — Telephone Encounter (Signed)
Pt called asking about culture results.  Also, she said she was having a lot of pain yesterday with urination and some this morning.   She also has nausea and possibly a fever.

## 2021-11-28 NOTE — Progress Notes (Signed)
    Chronic Care Management Pharmacy Assistant   Name: Valerie [name]  MRN: [id number] DOB: [birth year]  Reason for Encounter: CCM (General Adherence)  Recent office visits:  None since last CCM contact  Recent consult visits:  11/15/21 Nickolas Madrid, MD (Urology) Telephone: Thrush Start: DIFLUCAN 100 MG tablet 11/09/21 Nickolas Madrid, MD (Urology) Recurrent UTI Start: BACTRIM DS 800-160 MG tablet FU 2 months  Hospital visits:  None since last CCM contact  Medications: Outpatient Encounter Medications as of 11/28/2021  Medication Sig   acetaminophen (TYLENOL) 325 MG tablet Take 325 mg by mouth 2 (two) times daily as needed (pain).   acetaminophen (TYLENOL) 500 MG tablet Take 500-1,000 mg by mouth every 6 (six) hours as needed for moderate pain.   amLODipine (NORVASC) 5 MG tablet Take 1 tablet (5 mg total) by mouth daily. For blood pressure.   aspirin EC 81 MG tablet Take 81 mg by mouth daily. Swallow whole.   atorvastatin (LIPITOR) 10 MG tablet Take 1 tablet (10 mg total) by mouth daily. for cholesterol.   CALCIUM-VITAMIN D PO Take 2 tablets by mouth daily.   cephALEXin (KEFLEX) 500 MG capsule Take 1 capsule (500 mg total) by mouth 4 (four) times daily.   DULoxetine (CYMBALTA) 20 MG capsule Take 2 capsules (40 mg total) by mouth daily. For anxiety/depression/pain (Patient taking differently: Take 40 mg by mouth daily as needed (anxiety/depression).)   fluconazole (DIFLUCAN) 100 MG tablet Take 2 tablets ('200mg'$ ) by mouth day one, then take 1 tablet by mouth daily until finished   magic mouthwash (nystatin, lidocaine, diphenhydrAMINE) suspension Take 15 mLs by mouth 3 (three) times daily.   metoprolol succinate (TOPROL-XL) 50 MG 24 hr tablet TAKE 1 TABLET DAILY FOR HEART RATE/PALPITATIONS (Patient taking differently: Take 50 mg by mouth daily.)   omeprazole (PRILOSEC) 40 MG capsule TAKE 1 CAPSULE DAILY. FOR HEARTBURN. (Patient taking differently: Take 40 mg by mouth daily.)   ondansetron  (ZOFRAN-ODT) 4 MG disintegrating tablet Take 1 tablet (4 mg total) by mouth every 8 (eight) hours as needed for nausea or vomiting.   sulfamethoxazole-trimethoprim (BACTRIM DS) 800-160 MG tablet Take 1 tablet by mouth 2 (two) times daily.   valsartan (DIOVAN) 160 MG tablet Take 160 mg by mouth daily.   No facility-administered encounter medications on file as of 11/28/2021.    Attempted contact with patient 3 times. Unsuccessful outreach. Will atttempt contact next month.  Patient is not more than 5 days past due for refill on the following medications per chart history:  [facility] Medications: Medication Name/mg Last Fill Days Supply Atorvastatin 10 mg  10/07/21 90 Valsartan 160 mg  11/10/21 90  Since last visit with CPP, the following interventions have been made.  Start: BACTRIM DS 800-160 MG tablet Start: DIFLUCAN 100 MG tablet  Care Gaps: Annual wellness visit in last year? Yes 06/27/21 Most Recent BP reading: 122/66 on 11/09/21  Upcoming appointments: CCM appointment on 02/17/22  [name], CPP notified  [name], [place] Assistant [phone]

## 2021-11-30 ENCOUNTER — Encounter: Payer: Self-pay | Admitting: Urology

## 2021-11-30 ENCOUNTER — Ambulatory Visit: Payer: Medicare HMO | Admitting: Urology

## 2021-11-30 VITALS — BP 112/79 | HR 73 | Ht 64.0 in | Wt 137.0 lb

## 2021-11-30 DIAGNOSIS — R102 Pelvic and perineal pain: Secondary | ICD-10-CM

## 2021-11-30 DIAGNOSIS — R110 Nausea: Secondary | ICD-10-CM

## 2021-11-30 DIAGNOSIS — N390 Urinary tract infection, site not specified: Secondary | ICD-10-CM | POA: Diagnosis not present

## 2021-11-30 LAB — URINALYSIS, COMPLETE
Bilirubin, UA: NEGATIVE
Glucose, UA: NEGATIVE
Ketones, UA: NEGATIVE
Nitrite, UA: NEGATIVE
Specific Gravity, UA: 1.025 (ref 1.005–1.030)
Urobilinogen, Ur: 2 mg/dL — ABNORMAL HIGH (ref 0.2–1.0)
pH, UA: 6 (ref 5.0–7.5)

## 2021-11-30 LAB — MICROSCOPIC EXAMINATION: WBC, UA: >30 /hpf — AB (ref 0–5)

## 2021-11-30 MED ORDER — NITROFURANTOIN MONOHYD MACRO 100 MG PO CAPS: 100.0000 mg | ORAL_CAPSULE | Freq: Every day | ORAL | 0 refills | Status: DC

## 2021-11-30 MED ORDER — CIPROFLOXACIN HCL 500 MG PO TABS: 500.0000 mg | ORAL_TABLET | Freq: Two times a day (BID) | ORAL | 0 refills | Status: AC

## 2021-11-30 NOTE — Progress Notes (Signed)
   11/30/2021 11:10 AM   Valerie [name] 01-11-1951 [id number]  Reason for visit: Follow up recurrent UTIs, nausea, UTI symptoms today  HPI: 71 year old female who we have followed extensively for microscopic hematuria and recurrent UTIs and has been on topical estrogen cream and cranberry tablet prophylaxis.  She has had numerous normal CT scans over the last year as well as a normal cystoscopy in September 2022.  I was able to obtain and personally review and interpret the most recent CT from [place] on 10/27/2021 that shows some very subtle bladder wall thickening correlating with UTI at time of presentation, but no hydronephrosis, stones, or enhancing lesions.  Her primary issue has been ongoing nausea and overall malaise and abdominal pain over the last 6+ months of unclear etiology.  She has been followed by GI as well for her abdominal pain and nausea without definitive diagnosis.  I most recently saw her on 11/09/2021 when she had pelvic pressure and bladder discomfort in addition to the nausea and abdominal pain and ultimately grew > 100 K E. coli.  Atypical cultures were negative at that time.  This was treated with culture appropriate Bactrim, and she also developed a thrush yeast infection and was treated with fluconazole.  This resolved her urinary symptoms, however her nausea never really improved.  She reports recurrence of urinary symptoms with urgency/frequency, dysuria, pelvic pressure over the last 3 days.  Urinalysis today suspicious for infection with greater than 30 WBCs, 11-30 RBCs, many bacteria, nitrite negative, 1+ leukocytes.  She denies any fevers or chills.  She also continues to have problems with constipation and diarrhea.  We discussed its difficult to tell if the alternating constipation and diarrhea are contributing to her recurrent infections, or if these are unrelated processes.  We had another very long conversation today about her symptoms, and that I think most likely these  are 2 different issues- 1) the nausea, malaise, and intermittent abdominal pain and 2) recurrent UTIs.  I think at this point we need to treat her UTI and keep her on a prophylactic antibiotic for 3 months to rule out UTIs as a persistent cause of the abdominal pain and nausea, and if she persistently has nausea despite negative urinalysis would again recommend further evaluation with GI.  Fortunately CT scans have been reassuring over the last year, and cystoscopy was normal less than 1 year ago.  We could consider repeat cystoscopy in the future if she has persistent UTIs or symptoms, but anticipate this would be normal.  Cipro 500 mg twice daily x3 days for acute UTI, follow-up cultures, also sent for atypical culture Nitrofurantoin 100 mg daily prophylaxis x90 days RTC 4 to 6 weeks symptom check Re-consider cystoscopy in the future if recurrent infections despite above  Billey Co, MD  [place] [address], [place], [place] [address] [phone]

## 2021-12-05 LAB — CULTURE, URINE COMPREHENSIVE

## 2021-12-06 LAB — MYCOPLASMA / UREAPLASMA CULTURE
Mycoplasma hominis Culture: NEGATIVE
Ureaplasma urealyticum: NEGATIVE

## 2021-12-07 ENCOUNTER — Ambulatory Visit: Payer: Medicare HMO | Admitting: Physician Assistant

## 2021-12-13 ENCOUNTER — Other Ambulatory Visit: Payer: Self-pay | Admitting: Primary Care

## 2021-12-13 DIAGNOSIS — Z1231 Encounter for screening mammogram for malignant neoplasm of breast: Secondary | ICD-10-CM

## 2021-12-16 ENCOUNTER — Ambulatory Visit (INDEPENDENT_AMBULATORY_CARE_PROVIDER_SITE_OTHER): Payer: Medicare HMO | Admitting: Primary Care

## 2021-12-16 ENCOUNTER — Encounter: Payer: Self-pay | Admitting: Primary Care

## 2021-12-16 VITALS — BP 110/74 | HR 87 | Temp 98.7°F | Ht 64.0 in | Wt 138.0 lb

## 2021-12-16 DIAGNOSIS — R110 Nausea: Secondary | ICD-10-CM

## 2021-12-16 DIAGNOSIS — K5909 Other constipation: Secondary | ICD-10-CM

## 2021-12-16 DIAGNOSIS — K219 Gastro-esophageal reflux disease without esophagitis: Secondary | ICD-10-CM

## 2021-12-16 DIAGNOSIS — R519 Headache, unspecified: Secondary | ICD-10-CM

## 2021-12-16 DIAGNOSIS — R1084 Generalized abdominal pain: Secondary | ICD-10-CM

## 2021-12-16 NOTE — Assessment & Plan Note (Signed)
Chronic for years, unclear etiology. Evaluated by several GI providers in the past.  Reviewed ED notes from [facility] through care everywhere from June 2023. Reviewed CT scan from June 2023.  Referral placed to GI through [facility].

## 2021-12-16 NOTE — Assessment & Plan Note (Signed)
Chronic for years, increased frequency.  MRI brain ordered and pending.  Start Topamax 50 mg at bedtime for headache prevention. She will update in a few weeks.

## 2021-12-16 NOTE — Progress Notes (Signed)
Subjective:    Patient ID: Valerie [name], female    DOB: 09/23/1950, 71 y.o.   MRN: [id number]  HPI  Valerie [name] is a very pleasant 71 y.o. female with a history of chronic constipation, small bowel obstruction, chronic nausea, chronic abdominal pain, hemorrhoids, who presents today to discuss nausea and headaches.   Following with GI off-and-on throughout the years, last office visit was in April 2023.  During this visit it was recommended she continue omeprazole 40 mg twice daily, add Pepcid 40 mg at night, sleep on a wedge pillow at night.  Her dose of Linzess was reduced to 72 mcg daily as she experienced diarrhea on the 145 mcg dose.  She was also advised to resume her gabapentin.  MRI brain was discussed given her chronic nausea and occasional headaches, she declined at that time.  A referral to [facility] or [facility] was recommended, she kindly declined.  She underwent colonoscopy in March 2022 which revealed internal hemorrhoids, recall due in 2032.  She underwent esophageal manometry in 2017 which revealed functional heartburn.  She has undergone multiple CT scans throughout the years, most recent scan was completed at [facility] in early June 2023.  Evaluated at [facility] emergency department on 10/27/2021 for increased upper abdominal pain with radiation to lower abdomen.  Labs were not concerning for acute process.  She was treated with IV fluids, Zofran.  She underwent CT abdomen pelvis which was benign for acute abdominal process.  She was discharged home that day with recommendations for stopping Linzess and starting a stool softener.  Today she continues to experience daily nausea with daily vomiting. She is ready to see GI through [place]. She is taking Linzess 72 mcg daily and a Laxative once weekly. She will have bowel movements once daily. She denies feeling anxious.   She continues to experience frequent headache that occur 4-5 times weekly, lasting most of the day, located to the right parietal and  occipital lobes. Chronic for years. She will take Tylenol with little improvement. She will typically lay down with eventual resolve. She does experience nausea with and without headaches. She has not undergone MRI of her brain. She is managed on metoprolol succinate daily for palpitations.    BP Readings from Last 3 Encounters:  12/16/21 110/74  11/30/21 112/79  11/09/21 122/66     Review of Systems  Constitutional:  Negative for fever.  Gastrointestinal:  Positive for abdominal pain, constipation and nausea.  Neurological:  Positive for headaches.  Psychiatric/Behavioral:  The patient is not nervous/anxious.          Past Medical History:  Diagnosis Date   Acid reflux    Adenocarcinoma of the endometrium/uterus ([place]) 12/04/2000   Anal fissure    Anxiety    Atrophic vaginitis    COVID-19 virus infection 09/28/2020   Difficult intubation    limited neck mobility   Esophagitis    Hernia    Present to proximal to umbillical    History of hiatal hernia    Hyperlipidemia    Hypertension    Internal hemorrhoids    Menorrhagia    Osteopenia    PONV (postoperative nausea and vomiting)    Vertigo    Vitamin D deficiency     Social History   Socioeconomic History   Marital status: Married    Spouse name: Not on file   Number of children: Not on file   Years of education: Not on file   Highest education  level: Not on file  Occupational History   Not on file  Tobacco Use   Smoking status: Never    Passive exposure: Never   Smokeless tobacco: Never  Vaping Use   Vaping Use: Never used  Substance and Sexual Activity   Alcohol use: No   Drug use: Never   Sexual activity: Yes    Birth control/protection: Post-menopausal, Surgical  Other Topics Concern   Not on file  Social History Narrative   Married   Lives in [place]   Has 2 children, 4 grandchildren   Enjoys walking, shopping.   Social Determinants of Health   Financial Resource Strain: Low Risk   (06/21/2021)   Overall Financial Resource Strain (CARDIA)    Difficulty of Paying Living Expenses: Not hard at all  Food Insecurity: No Food Insecurity (06/21/2021)   Hunger Vital Sign    Worried About Running Out of Food in the Last Year: Never true    Ran Out of Food in the Last Year: Never true  Transportation Needs: No Transportation Needs (06/21/2021)   PRAPARE - Hydrologist (Medical): No    Lack of Transportation (Non-Medical): No  Physical Activity: Sufficiently Active (06/21/2021)   Exercise Vital Sign    Days of Exercise per Week: 7 days    Minutes of Exercise per Session: 30 min  Stress: No Stress Concern Present (06/21/2021)   [place]    Feeling of Stress : Not at all  Social Connections: Moderately Integrated (06/21/2021)   Social Connection and Isolation Panel [NHANES]    Frequency of Communication with Friends and Family: More than three times a week    Frequency of Social Gatherings with Friends and Family: Three times a week    Attends Religious Services: More than 4 times per year    Active Member of Clubs or Organizations: No    Attends Archivist Meetings: Never    Marital Status: Married  Human resources officer Violence: Not At Risk (06/21/2021)   Humiliation, Afraid, Rape, and Kick questionnaire    Fear of Current or Ex-Partner: No    Emotionally Abused: No    Physically Abused: No    Sexually Abused: No    Past Surgical History:  Procedure Laterality Date   50 HOUR [place] STUDY N/A 11/29/2015   Procedure: [place] STUDY;  Surgeon: Manus Gunning, MD;  Location: [facility];  Service: Gastroenterology;  Laterality: N/A;   ABDOMINAL HYSTERECTOMY  12/04/2000   also bilateral salpingo-oophorectomy   CARDIAC CATHETERIZATION  2006   CARPAL TUNNEL RELEASE  11/20/2011   Procedure: CARPAL TUNNEL RELEASE;  Surgeon: Elaina Hoops, MD;  Location: [place] [facility];  Service:  Neurosurgery;  Laterality: Left;  LEFT carpal tunnel release   CERVICAL SPINE SURGERY  2013   [place]   COLONOSCOPY WITH PROPOFOL N/A 07/27/2020   Procedure: COLONOSCOPY WITH PROPOFOL;  Surgeon: Lucilla Lame, MD;  Location: [facility] ENDOSCOPY;  Service: Endoscopy;  Laterality: N/A;   DILATION AND CURETTAGE OF UTERUS     11/15/2000   ESOPHAGEAL MANOMETRY N/A 09/20/2015   Procedure: ESOPHAGEAL MANOMETRY (EM);  Surgeon: Manus Gunning, MD;  Location: [facility];  Service: Gastroenterology;  Laterality: N/A;   ESOPHAGOGASTRODUODENOSCOPY (EGD) WITH PROPOFOL N/A 05/17/2021   Procedure: ESOPHAGOGASTRODUODENOSCOPY (EGD) WITH PROPOFOL;  Surgeon: Virgel Manifold, MD;  Location: [facility] ENDOSCOPY;  Service: Endoscopy;  Laterality: N/A;   HYSTEROSCOPY  11/15/2000  D & C, resection of endometrial polyps   LUMBAR SPINAL CORD SIMULATOR LEAD REMOVAL N/A 08/01/2021   Procedure: REMOVAL OF SPINAL CORD STIMULATOR;  Surgeon: Meade Maw, MD;  Location: [facility] ORS;  Service: Neurosurgery;  Laterality: N/A;   OOPHORECTOMY  12/04/2000   bilateral salpingo-oophorectomy done with TAH   SPINAL CORD STIMULATOR INSERTION Bilateral 04/12/2020   Procedure: INSERTION CERVICAL SPINAL STIMULATOR PULSE GENERATOR;  Surgeon: [name], MD;  Location: [facility] ORS;  Service: Neurosurgery;  Laterality: Bilateral;   SPINAL CORD STIMULATOR TRIAL N/A 03/22/2020   Procedure: CERVICAL SPINAL CORD STIMULATOR TRIAL PERCUTANEOUS;  Surgeon: [name], MD;  Location: [facility] ORS;  Service: Neurosurgery;  Laterality: N/A;   TUBAL LIGATION      Family History  Problem Relation Age of Onset   Hypertension Father    Prostate cancer Father    Hypertension Sister    Lung cancer Sister    Diabetes Brother    Heart disease Brother    Hypertension Brother    Diabetes Brother    Heart disease Brother    Breast cancer Neg Hx     No Known Allergies  Current Outpatient Medications on File Prior to Visit   Medication Sig Dispense Refill   acetaminophen (TYLENOL) 325 MG tablet Take 325 mg by mouth 2 (two) times daily as needed (pain).     acetaminophen (TYLENOL) 500 MG tablet Take 500-1,000 mg by mouth every 6 (six) hours as needed for moderate pain.     amLODipine (NORVASC) 5 MG tablet Take 1 tablet (5 mg total) by mouth daily. For blood pressure. 90 tablet 1   aspirin EC 81 MG tablet Take 81 mg by mouth daily. Swallow whole.     atorvastatin (LIPITOR) 10 MG tablet Take 1 tablet (10 mg total) by mouth daily. for cholesterol. 90 tablet 1   CALCIUM-VITAMIN D PO Take 2 tablets by mouth daily.     DULoxetine (CYMBALTA) 20 MG capsule Take 2 capsules (40 mg total) by mouth daily. For anxiety/depression/pain 180 capsule 0   metoprolol succinate (TOPROL-XL) 50 MG 24 hr tablet TAKE 1 TABLET DAILY FOR HEART RATE/PALPITATIONS 90 tablet 3   nitrofurantoin, macrocrystal-monohydrate, (MACROBID) 100 MG capsule Take 1 capsule (100 mg total) by mouth daily. 90 capsule 0   omeprazole (PRILOSEC) 40 MG capsule TAKE 1 CAPSULE DAILY. FOR HEARTBURN. 90 capsule 2   ondansetron (ZOFRAN-ODT) 4 MG disintegrating tablet Take 1 tablet (4 mg total) by mouth every 8 (eight) hours as needed for nausea or vomiting. 20 tablet 0   valsartan (DIOVAN) 160 MG tablet Take 160 mg by mouth daily.     No current facility-administered medications on file prior to visit.    BP 110/74   Pulse 87   Temp 98.7 F (37.1 C) (Oral)   Ht '5\' 4"'$  (1.626 m)   Wt 138 lb (62.6 kg)   SpO2 95%   BMI 23.69 kg/m  Objective:   Physical Exam Constitutional:      General: She is not in acute distress. Eyes:     Extraocular Movements: Extraocular movements intact.  Cardiovascular:     Rate and Rhythm: Normal rate and regular rhythm.  Pulmonary:     Effort: Pulmonary effort is normal.     Breath sounds: Normal breath sounds.  Abdominal:     Palpations: Abdomen is soft.     Tenderness: There is abdominal tenderness. There is no guarding.   Musculoskeletal:     Cervical back: Neck supple.  Skin:  General: Skin is warm and dry.  Neurological:     Mental Status: She is oriented to person, place, and time.     Cranial Nerves: No cranial nerve deficit.           Assessment & Plan:   Problem List Items Addressed This Visit       Digestive   Esophageal reflux   Relevant Orders   Ambulatory referral to Gastroenterology   Chronic constipation    Overall controlled.  Continue Linzess 72 mcg daily. Caution provided regarding Laxative use.  Referral placed to GI.      Relevant Orders   Ambulatory referral to Gastroenterology     Other   Chronic nausea - Primary    Unclear cause.  Reviewed GI visit from April 2023. Reviewed ED visit from [place] through [place] in June 2023,  Referral placed to GI at [facility].      Relevant Orders   Ambulatory referral to Gastroenterology   Generalized abdominal pain    Chronic for years, unclear etiology. Evaluated by several GI providers in the past.  Reviewed ED notes from [place] through care everywhere from June 2023. Reviewed CT scan from June 2023.  Referral placed to GI through [facility].      Relevant Orders   Ambulatory referral to Gastroenterology   Frequent headaches    Chronic for years, increased frequency.  MRI brain ordered and pending.  Start Topamax 50 mg at bedtime for headache prevention. She will update in a few weeks.      Relevant Orders   MR Brain Wo Contrast       Pleas Koch, NP

## 2021-12-16 NOTE — Patient Instructions (Signed)
You will be contacted regarding your MRI.  Please let us know if you have not been contacted within two weeks.   You will be contacted regarding your referral to GI through [place].  Please let us know if you have not been contacted within two weeks.   Start topiramate (Topamax) for headache prevention.  Take 1 tablet by mouth every evening at bedtime.  Please update me in 2 weeks.   It was a pleasure to see you today!

## 2021-12-16 NOTE — Assessment & Plan Note (Signed)
Overall controlled.  Continue Linzess 72 mcg daily. Caution provided regarding Laxative use.  Referral placed to GI.

## 2021-12-16 NOTE — Assessment & Plan Note (Signed)
Unclear cause.  Reviewed GI visit from April 2023. Reviewed ED visit from [place] through [facility] in June 2023,  Referral placed to GI at [facility].

## 2021-12-22 ENCOUNTER — Telehealth: Payer: Self-pay

## 2021-12-22 DIAGNOSIS — R519 Headache, unspecified: Secondary | ICD-10-CM

## 2021-12-22 NOTE — Telephone Encounter (Signed)
Patient is calling in stating that her pharmacy has not received the TOPAMAX for the headaches, wanting to know if we can get this sent in.

## 2021-12-23 MED ORDER — TOPIRAMATE 50 MG PO TABS: 50.0000 mg | ORAL_TABLET | Freq: Every day | ORAL | 0 refills | Status: DC

## 2021-12-23 NOTE — Telephone Encounter (Signed)
Noted, will send in now.

## 2021-12-23 NOTE — Telephone Encounter (Signed)
See where you wanted her to start Topamax but does not look like it was sent in

## 2021-12-25 ENCOUNTER — Ambulatory Visit
Admission: RE | Admit: 2021-12-25 | Discharge: 2021-12-25 | Disposition: A | Discharge status: Home / Self Care | Payer: Medicare HMO | Source: Ambulatory Visit | Attending: Primary Care | Admitting: Primary Care

## 2021-12-25 DIAGNOSIS — R519 Headache, unspecified: Secondary | ICD-10-CM | POA: Diagnosis not present

## 2021-12-26 NOTE — Telephone Encounter (Signed)
Patient notified by telephone that script was sent. Patient stated that she has picked it up.

## 2021-12-28 ENCOUNTER — Telehealth: Payer: Self-pay | Admitting: Primary Care

## 2021-12-28 DIAGNOSIS — R519 Headache, unspecified: Secondary | ICD-10-CM

## 2021-12-28 NOTE — Telephone Encounter (Signed)
Patient called back about MRI.   Pleas Koch, NP  12/27/2021  4:51 PM EDT     Please notify patient:   MRI brain is overall okay. She does have inflammation within the sinuses. I'm not quite convinced that this is the full cause of her headaches. I do recommend she continue with the Topamax for headache prevention. Have her update [place] in 1-2 weeks.    She could also benefit from using Flonase nasal spray for the sinus inflammation. 1 spray in each nostril BID.    I let her know Clearence Cheek message and she asked if [name] would send in script for [facility]

## 2021-12-29 MED ORDER — FLUTICASONE PROPIONATE 50 MCG/ACT NA SUSP: 1.0000 | Freq: Two times a day (BID) | NASAL | 0 refills | Status: DC

## 2021-12-29 NOTE — Telephone Encounter (Signed)
Noted, Rx for Flonase sent to pharmacy

## 2022-01-03 ENCOUNTER — Ambulatory Visit (INDEPENDENT_AMBULATORY_CARE_PROVIDER_SITE_OTHER)
Admission: RE | Admit: 2022-01-03 | Discharge: 2022-01-03 | Disposition: A | Discharge status: Home / Self Care | Payer: Medicare HMO | Source: Ambulatory Visit | Attending: Primary Care | Admitting: Primary Care

## 2022-01-03 ENCOUNTER — Encounter: Payer: Self-pay | Admitting: Primary Care

## 2022-01-03 ENCOUNTER — Ambulatory Visit (INDEPENDENT_AMBULATORY_CARE_PROVIDER_SITE_OTHER): Payer: Medicare HMO | Admitting: Primary Care

## 2022-01-03 DIAGNOSIS — R1084 Generalized abdominal pain: Secondary | ICD-10-CM

## 2022-01-03 DIAGNOSIS — R110 Nausea: Secondary | ICD-10-CM | POA: Diagnosis not present

## 2022-01-03 DIAGNOSIS — R109 Unspecified abdominal pain: Secondary | ICD-10-CM | POA: Diagnosis not present

## 2022-01-03 NOTE — Patient Instructions (Addendum)
Stop by the lab and xray prior to leaving today. I will notify you of your results once received.   Call [facility] for an appointment. You should be seeing Dr. Georgina Quint.   Avoid spicy food, acidic food, onions/peppers, caffeine, carbonated beverages.   It was a pleasure to see you today!

## 2022-01-03 NOTE — Progress Notes (Signed)
Subjective:    Patient ID: Valerie [name], female    DOB: [birth year], 71 y.o.   MRN: [id number]  HPI  Valerie [name] is a very pleasant 71 y.o. female with a history of hypertension, GERD, chronic nausea, chronic abdominal pain, anxiety disorder, partial bowel obstruction, chronic constipation, gastric erythema who presents today to discuss nausea.  Evaluated in our office on 12/16/2021 for chronic nausea of undetermined cause.  She recently underwent MRI brain to rule out any other cause for nausea, especially in the setting of frequent headaches.  MRI brain was negative for acute findings.  She has follow-up with GI over the years for chronic abdominal pain, constipation, nausea.  During her visit in July 2023 we referred her to [place] for further evaluation.  Today she endorses increased frequency of her chronic nausea for which has been constant over the last week. Two days ago she woke up feeling more nauseated than normal, was able to get up and go to church. She felt chills at the time. Later that day she threw up twice after eating part of a sweet potato at lunch. Yesterday she threw up twice after eating grilled onions and peppers. She is working to stay hydrated with water.   She has a history of gagging with and without meals with her chronic nausea. She had a large bowel movement yesterday. She is passing gas. She has yet to hear from [place]. She has been taking Zofran PRN with improvement sometimes.    Review of Systems  Constitutional:  Positive for chills. Negative for fever.  Gastrointestinal:  Positive for abdominal pain, nausea and vomiting. Negative for blood in stool, constipation and diarrhea.         Past Medical History:  Diagnosis Date   Acid reflux    Adenocarcinoma of the endometrium/uterus ([place]) 12/04/2000   Anal fissure    Anxiety    Atrophic vaginitis    COVID-19 virus infection 09/28/2020   Difficult intubation    limited neck mobility   Esophagitis     Hernia    Present to proximal to umbillical    History of hiatal hernia    Hyperlipidemia    Hypertension    Internal hemorrhoids    Menorrhagia    Osteopenia    PONV (postoperative nausea and vomiting)    Vertigo    Vitamin D deficiency     Social History   Socioeconomic History   Marital status: Married    Spouse name: Not on file   Number of children: Not on file   Years of education: Not on file   Highest education level: Not on file  Occupational History   Not on file  Tobacco Use   Smoking status: Never    Passive exposure: Never   Smokeless tobacco: Never  Vaping Use   Vaping Use: Never used  Substance and Sexual Activity   Alcohol use: No   Drug use: Never   Sexual activity: Yes    Birth control/protection: Post-menopausal, Surgical  Other Topics Concern   Not on file  Social History Narrative   Married   Lives in [place]   Has 2 children, 4 grandchildren   Enjoys walking, shopping.   Social Determinants of Health   Financial Resource Strain: Low Risk  (06/21/2021)   Overall Financial Resource Strain (CARDIA)    Difficulty of Paying Living Expenses: Not hard at all  Food Insecurity: No Food Insecurity (06/21/2021)   Hunger Vital Sign  Worried About Charity fundraiser in the Last Year: Never true    [place] in the Last Year: Never true  Transportation Needs: No Transportation Needs (06/21/2021)   PRAPARE - Hydrologist (Medical): No    Lack of Transportation (Non-Medical): No  Physical Activity: Sufficiently Active (06/21/2021)   Exercise Vital Sign    Days of Exercise per Week: 7 days    Minutes of Exercise per Session: 30 min  Stress: No Stress Concern Present (06/21/2021)   [place]    Feeling of Stress : Not at all  Social Connections: Moderately Integrated (06/21/2021)   Social Connection and Isolation Panel [NHANES]    Frequency of  Communication with Friends and Family: More than three times a week    Frequency of Social Gatherings with Friends and Family: Three times a week    Attends Religious Services: More than 4 times per year    Active Member of Clubs or Organizations: No    Attends Archivist Meetings: Never    Marital Status: Married  Human resources officer Violence: Not At Risk (06/21/2021)   Humiliation, Afraid, Rape, and Kick questionnaire    Fear of Current or Ex-Partner: No    Emotionally Abused: No    Physically Abused: No    Sexually Abused: No    Past Surgical History:  Procedure Laterality Date   57 HOUR [place] STUDY N/A 11/29/2015   Procedure: [place] STUDY;  Surgeon: Manus Gunning, MD;  [facility];  Service: Gastroenterology;  Laterality: N/A;   ABDOMINAL HYSTERECTOMY  12/04/2000   also bilateral salpingo-oophorectomy   CARDIAC CATHETERIZATION  2006   CARPAL TUNNEL RELEASE  11/20/2011   Procedure: CARPAL TUNNEL RELEASE;  Surgeon: Elaina Hoops, MD;  Location: [place] [facility]UR[facility] OR[facility];  Service: Neurosurgery;  Laterality: Left;  LEFT carpal tunnel release   CERVICAL SPINE SURGERY  2013   [place]   COLONOSCOPY WITH PROPOFOL N/A 07/27/2020   Procedure: COLONOSCOPY WITH PROPOFOL;  Surgeon: Lucilla Lame, MD;  Location[facility] ENDOSCOPY;  Service: Endoscopy;  Laterality: N/A;   DILATION AND CURETTAGE OF UTERUS     11/15/2000   ESOPHAGEAL MANOMETRY N/A 09/20/2015   Procedure: ESOPHAGEAL MANOMETRY (EM);  Surgeon: Manus Gunning, MD;  [facility];  Service: Gastroenterology;  Laterality: N/A;   ESOPHAGOGASTRODUODENOSCOPY (EGD) WITH PROPOFOL N/A 05/17/2021   Procedure: ESOPHAGOGASTRODUODENOSCOPY (EGD) WITH PROPOFOL;  Surgeon: Virgel Manifold, MD;  Location: [facility] ENDOSCOPY;  Service: Endoscopy;  Laterality: N/A;   HYSTEROSCOPY  11/15/2000   D & C, resection of endometrial polyps   LUMBAR SPINAL CORD SIMULATOR LEAD REMOVAL N/A 08/01/2021   Procedure:  REMOVAL OF SPINAL CORD STIMULATOR;  Surgeon: Meade Maw, MD;  Location: [facility] ORS;  Service: Neurosurgery;  Laterality: N/A;   OOPHORECTOMY  12/04/2000   bilateral salpingo-oophorectomy done with TAH   SPINAL CORD STIMULATOR INSERTION Bilateral 04/12/2020   Procedure: INSERTION CERVICAL SPINAL STIMULATOR PULSE GENERATOR;  Surgeon: [name], MD;  Location: [facility] ORS;  Service: Neurosurgery;  Laterality: Bilateral;   SPINAL CORD STIMULATOR TRIAL N/A 03/22/2020   Procedure: CERVICAL SPINAL CORD STIMULATOR TRIAL PERCUTANEOUS;  Surgeon: [name], MD;  Location: [facility] ORS;  Service: Neurosurgery;  Laterality: N/A;   TUBAL LIGATION      Family History  Problem Relation Age of Onset   Hypertension Father    Prostate cancer Father    Hypertension  Sister    Lung cancer Sister    Diabetes Brother    Heart disease Brother    Hypertension Brother    Diabetes Brother    Heart disease Brother    Breast cancer Neg Hx     No Known Allergies  Current Outpatient Medications on File Prior to Visit  Medication Sig Dispense Refill   acetaminophen (TYLENOL) 325 MG tablet Take 325 mg by mouth 2 (two) times daily as needed (pain).     acetaminophen (TYLENOL) 500 MG tablet Take 500-1,000 mg by mouth every 6 (six) hours as needed for moderate pain.     amLODipine (NORVASC) 5 MG tablet Take 1 tablet (5 mg total) by mouth daily. For blood pressure. 90 tablet 1   aspirin EC 81 MG tablet Take 81 mg by mouth daily. Swallow whole.     atorvastatin (LIPITOR) 10 MG tablet Take 1 tablet (10 mg total) by mouth daily. for cholesterol. 90 tablet 1   CALCIUM-VITAMIN D PO Take 2 tablets by mouth daily.     DULoxetine (CYMBALTA) 20 MG capsule Take 2 capsules (40 mg total) by mouth daily. For anxiety/depression/pain 180 capsule 0   fluticasone (FLONASE) 50 MCG/ACT nasal spray Place 1 spray into both nostrils 2 (two) times daily. 16 g 0   metoprolol succinate (TOPROL-XL) 50 MG 24 hr tablet TAKE 1 TABLET DAILY  FOR HEART RATE/PALPITATIONS 90 tablet 3   omeprazole (PRILOSEC) 40 MG capsule TAKE 1 CAPSULE DAILY. FOR HEARTBURN. 90 capsule 2   ondansetron (ZOFRAN-ODT) 4 MG disintegrating tablet Take 1 tablet (4 mg total) by mouth every 8 (eight) hours as needed for nausea or vomiting. 20 tablet 0   topiramate (TOPAMAX) 50 MG tablet Take 1 tablet (50 mg total) by mouth daily. For headache prevention 90 tablet 0   valsartan (DIOVAN) 160 MG tablet Take 160 mg by mouth daily.     No current facility-administered medications on file prior to visit.    BP 110/62   Pulse 73   Temp 98.6 F (37 C) (Oral)   Ht '5\' 4"'$  (1.626 m)   Wt 137 lb (62.1 kg)   SpO2 97%   BMI 23.52 kg/m  Objective:   Physical Exam Cardiovascular:     Rate and Rhythm: Normal rate and regular rhythm.  Pulmonary:     Effort: Pulmonary effort is normal.     Breath sounds: Normal breath sounds.  Abdominal:     General: Bowel sounds are normal.     Palpations: Abdomen is soft.     Tenderness: There is generalized abdominal tenderness. There is no guarding.  Musculoskeletal:     Cervical back: Neck supple.  Skin:    General: Skin is warm and dry.           Assessment & Plan:   Problem List Items Addressed This Visit       Other   Chronic nausea    Progressing.  Checking abdominal plain films today to rule out obstruction, especially given history of partial bowel obstruction. Checking labs to evaluate for hydration status.  Continue Zofran PRN.  She will contact [facility] for an appointment.      Relevant Orders   CBC with Differential/Platelet   Basic metabolic panel   Generalized abdominal pain    Tender throughout abdomen on exam, no guarding. She is not in distress.  Checking abdominal plain films today. Labs ordered and pending.  She will call [facility] for evaluation.  Relevant Orders   CBC with Differential/Platelet   Basic metabolic panel   DG Abd 2 Views       Pleas Koch,  NP

## 2022-01-03 NOTE — Assessment & Plan Note (Signed)
Tender throughout abdomen on exam, no guarding. She is not in distress.  Checking abdominal plain films today. Labs ordered and pending.  She will call [facility] for evaluation.

## 2022-01-03 NOTE — Assessment & Plan Note (Signed)
Progressing.  Checking abdominal plain films today to rule out obstruction, especially given history of partial bowel obstruction. Checking labs to evaluate for hydration status.  Continue Zofran PRN.  She will contact [facility] for an appointment.

## 2022-01-04 ENCOUNTER — Other Ambulatory Visit: Payer: Self-pay

## 2022-01-04 ENCOUNTER — Telehealth: Payer: Self-pay | Admitting: Primary Care

## 2022-01-04 ENCOUNTER — Encounter: Payer: Self-pay | Admitting: *Deleted

## 2022-01-04 DIAGNOSIS — I10 Essential (primary) hypertension: Secondary | ICD-10-CM | POA: Insufficient documentation

## 2022-01-04 DIAGNOSIS — Z8616 Personal history of COVID-19: Secondary | ICD-10-CM | POA: Insufficient documentation

## 2022-01-04 DIAGNOSIS — N390 Urinary tract infection, site not specified: Secondary | ICD-10-CM | POA: Diagnosis not present

## 2022-01-04 DIAGNOSIS — R112 Nausea with vomiting, unspecified: Secondary | ICD-10-CM | POA: Diagnosis not present

## 2022-01-04 DIAGNOSIS — R1030 Lower abdominal pain, unspecified: Secondary | ICD-10-CM | POA: Diagnosis present

## 2022-01-04 DIAGNOSIS — R1110 Vomiting, unspecified: Secondary | ICD-10-CM | POA: Diagnosis not present

## 2022-01-04 DIAGNOSIS — R109 Unspecified abdominal pain: Secondary | ICD-10-CM | POA: Diagnosis not present

## 2022-01-04 LAB — CBC WITH DIFFERENTIAL/PLATELET
Basophils Absolute: 0.1 10*3/uL (ref 0.0–0.1)
Basophils Relative: 1.1 % (ref 0.0–3.0)
Eosinophils Absolute: 0 10*3/uL (ref 0.0–0.7)
Eosinophils Relative: 0.9 % (ref 0.0–5.0)
HCT: 38 % (ref 36.0–46.0)
Hemoglobin: 13 g/dL (ref 12.0–15.0)
Lymphocytes Relative: 51.5 % — ABNORMAL HIGH (ref 12.0–46.0)
Lymphs Abs: 2.6 10*3/uL (ref 0.7–4.0)
MCHC: 34.3 g/dL (ref 30.0–36.0)
MCV: 95.6 fl (ref 78.0–100.0)
Monocytes Absolute: 0.4 10*3/uL (ref 0.1–1.0)
Monocytes Relative: 7.8 % (ref 3.0–12.0)
Neutro Abs: 2 10*3/uL (ref 1.4–7.7)
Neutrophils Relative %: 38.7 % — ABNORMAL LOW (ref 43.0–77.0)
Platelets: 231 10*3/uL (ref 150.0–400.0)
RBC: 3.98 Mil/uL (ref 3.87–5.11)
RDW: 14.5 % (ref 11.5–15.5)
WBC: 5.1 10*3/uL (ref 4.0–10.5)

## 2022-01-04 LAB — BASIC METABOLIC PANEL
BUN: 12 mg/dL (ref 6–23)
CO2: 27 mEq/L (ref 19–32)
Calcium: 9.7 mg/dL (ref 8.4–10.5)
Chloride: 102 mEq/L (ref 96–112)
Creatinine, Ser: 0.86 mg/dL (ref 0.40–1.20)
GFR: 68.96 mL/min (ref >60.00)
Glucose, Bld: 104 mg/dL — ABNORMAL HIGH (ref 70–99)
Potassium: 3.9 mEq/L (ref 3.5–5.1)
Sodium: 138 mEq/L (ref 135–145)

## 2022-01-04 LAB — COMPREHENSIVE METABOLIC PANEL
ALT: 19 U/L (ref 0–44)
AST: 29 U/L (ref 15–41)
Albumin: 4.1 g/dL (ref 3.5–5.0)
Alkaline Phosphatase: 59 U/L (ref 38–126)
Anion gap: 7 (ref 5–15)
BUN: 9 mg/dL (ref 8–23)
CO2: 26 mmol/L (ref 22–32)
Calcium: 9.4 mg/dL (ref 8.9–10.3)
Chloride: 105 mmol/L (ref 98–111)
Creatinine, Ser: 0.84 mg/dL (ref 0.44–1.00)
GFR, Estimated: >60 mL/min (ref >60)
Glucose, Bld: 123 mg/dL — ABNORMAL HIGH (ref 70–99)
Potassium: 3.5 mmol/L (ref 3.5–5.1)
Sodium: 138 mmol/L (ref 135–145)
Total Bilirubin: 0.7 mg/dL (ref 0.3–1.2)
Total Protein: 7.2 g/dL (ref 6.5–8.1)

## 2022-01-04 LAB — CBC
HCT: 36.6 % (ref 36.0–46.0)
Hemoglobin: 12.3 g/dL (ref 12.0–15.0)
MCH: 32.5 pg (ref 26.0–34.0)
MCHC: 33.6 g/dL (ref 30.0–36.0)
MCV: 96.6 fL (ref 80.0–100.0)
Platelets: 200 10*3/uL (ref 150–400)
RBC: 3.79 MIL/uL — ABNORMAL LOW (ref 3.87–5.11)
RDW: 13.6 % (ref 11.5–15.5)
WBC: 6.5 10*3/uL (ref 4.0–10.5)
nRBC: 0 % (ref 0.0–0.2)

## 2022-01-04 LAB — URINALYSIS, ROUTINE W REFLEX MICROSCOPIC
Bilirubin Urine: NEGATIVE
Glucose, UA: NEGATIVE mg/dL
Ketones, ur: NEGATIVE mg/dL
Nitrite: POSITIVE — AB
Protein, ur: NEGATIVE mg/dL
Specific Gravity, Urine: 1.003 — ABNORMAL LOW (ref 1.005–1.030)
WBC, UA: >50 WBC/hpf — ABNORMAL HIGH (ref 0–5)
pH: 6 (ref 5.0–8.0)

## 2022-01-04 LAB — LIPASE, BLOOD: Lipase: 61 U/L — ABNORMAL HIGH (ref 11–51)

## 2022-01-04 NOTE — ED Notes (Signed)
Patient transported to CT 

## 2022-01-04 NOTE — Telephone Encounter (Signed)
Called patient see results notes for documentation.

## 2022-01-04 NOTE — Telephone Encounter (Signed)
Patient called to get test results. Call back number [phone].

## 2022-01-04 NOTE — ED Triage Notes (Signed)
Pt has abd pain with nausea and vomiting;   pt saw pmd yesterday and had xrays.  Pt was called today and told to come to er for bowel obstruction.  Pt alert.  Speech clear.

## 2022-01-05 ENCOUNTER — Emergency Department: Payer: Medicare HMO

## 2022-01-05 ENCOUNTER — Other Ambulatory Visit: Payer: Self-pay

## 2022-01-05 ENCOUNTER — Emergency Department
Admission: EM | Admit: 2022-01-05 | Discharge: 2022-01-05 | Disposition: A | Discharge status: Home / Self Care | Payer: Medicare HMO | Attending: Emergency Medicine | Admitting: Emergency Medicine

## 2022-01-05 DIAGNOSIS — R110 Nausea: Secondary | ICD-10-CM

## 2022-01-05 DIAGNOSIS — N390 Urinary tract infection, site not specified: Secondary | ICD-10-CM

## 2022-01-05 DIAGNOSIS — R109 Unspecified abdominal pain: Secondary | ICD-10-CM | POA: Diagnosis not present

## 2022-01-05 DIAGNOSIS — R1110 Vomiting, unspecified: Secondary | ICD-10-CM | POA: Diagnosis not present

## 2022-01-05 MED ORDER — ONDANSETRON 4 MG PO TBDP: 4.0000 mg | ORAL_TABLET | Freq: Three times a day (TID) | ORAL | 0 refills | Status: AC | PRN

## 2022-01-05 MED ORDER — CEPHALEXIN 500 MG PO CAPS: 500.0000 mg | ORAL_CAPSULE | Freq: Two times a day (BID) | ORAL | 0 refills | Status: AC

## 2022-01-05 MED ORDER — IOHEXOL 300 MG/ML  SOLN
100.0000 mL | Freq: Once | INTRAMUSCULAR | Status: AC | PRN
  Administered 2022-01-05: 100 mL via INTRAVENOUS

## 2022-01-05 MED ORDER — SODIUM CHLORIDE 0.9 % IV SOLN
1.0000 g | Freq: Once | INTRAVENOUS | Status: AC
  Administered 2022-01-05: 1 g via INTRAVENOUS
  Filled 2022-01-05: qty 10

## 2022-01-05 NOTE — ED Provider Notes (Signed)
[facility] Provider Note    Event Date/Time   First MD Initiated Contact with Patient 01/05/22 [phone]     (approximate)   History   Emesis and Abdominal Pain   HPI  Valerie [name] is a 71 y.o. female with a past medical history of GERD, anxiety, atrophic vaginitis, HTN, HDL, esophagitis and recurrent urinary tract infections most recently seen by urology on 7/5 prescribed ciprofloxacin for 3-day course as well as 90 days of prophylaxis nitrofurantoin as well as chronic nausea and vomiting on Zofran who presents after being told to come to the emergency room by PCP after she had plain films of her abdomen done on the eighth that were concerning for possible obstruction per patient.  Patient states she has had 2 or 3 days of some slightly increased pain in her suprapubic region.  She does not have any change in her chronic nausea or vomiting.  She is prescribed Zofran.  She also states he has been having a little diarrhea which is not unusual for her.  She has no fevers, cough, chest pain, earache or sore throat or back pain.  No bleeding in her urine or stool.  She denies any other acute sick symptoms at this time.    Past Medical History:  Diagnosis Date   Acid reflux    Adenocarcinoma of the endometrium/uterus ([facility]) 12/04/2000   Anal fissure    Anxiety    Atrophic vaginitis    COVID-19 virus infection 09/28/2020   Difficult intubation    limited neck mobility   Esophagitis    Hernia    Present to proximal to umbillical    History of hiatal hernia    Hyperlipidemia    Hypertension    Internal hemorrhoids    Menorrhagia    Osteopenia    PONV (postoperative nausea and vomiting)    Vertigo    Vitamin D deficiency      Physical Exam  Triage Vital Signs: ED Triage Vitals  Enc Vitals Group     BP 01/04/22 2027 129/74     Pulse Rate 01/04/22 2027 73     Resp 01/04/22 2027 18     Temp 01/04/22 2027 98.6 F (37 C)     Temp Source 01/04/22 2027 Oral      SpO2 01/04/22 2027 95 %     Weight 01/04/22 2038 135 lb (61.2 kg)     Height 01/04/22 2038 '5\' 4"'$  (1.626 m)     Head Circumference --      Peak Flow --      Pain Score 01/04/22 2038 5     Pain Loc --      Pain Edu? --      Excl. in [place]? --     Most recent vital signs: Vitals:   01/04/22 2027 01/05/22 0033  BP: 129/74 108/71  Pulse: 73 70  Resp: 18 16  Temp: 98.6 F (37 C) 98.1 F (36.7 C)  SpO2: 95% 95%    General: Awake, no distress.  CV:  Good peripheral perfusion.  2+ radial pulses. Resp:  Normal effort.  Clear bilaterally. Abd:  No distention.  Soft throughout. Other:  No CVA tenderness.   ED Results / Procedures / Treatments  Labs (all labs ordered are listed, but only abnormal results are displayed) Labs Reviewed  LIPASE, BLOOD - Abnormal; Notable for the following components:      Result Value   Lipase 61 (*)    All other  components within normal limits  COMPREHENSIVE METABOLIC PANEL - Abnormal; Notable for the following components:   Glucose, Bld 123 (*)    All other components within normal limits  CBC - Abnormal; Notable for the following components:   RBC 3.79 (*)    All other components within normal limits  URINALYSIS, ROUTINE W REFLEX MICROSCOPIC - Abnormal; Notable for the following components:   Color, Urine YELLOW (*)    APPearance CLOUDY (*)    Specific Gravity, Urine 1.003 (*)    Hgb urine dipstick MODERATE (*)    Nitrite POSITIVE (*)    Leukocytes,Ua LARGE (*)    WBC, UA >50 (*)    Bacteria, UA MANY (*)    All other components within normal limits  URINE CULTURE     EKG  EKG is remarkable for sinus rhythm with a ventricular rate of 65, normal axis with unremarkable intervals and without clear evidence of acute ischemia or significant arrhythmia   RADIOLOGY  CT abdomen pelvis on my interpretation without evidence of ureteral stone, perinephric stranding, diverticulitis, pancreatitis or other acute abdominal or pelvic process.  I also  reviewed radiology interpretation and agree with the findings of same.   PROCEDURES:  Critical Care performed: No  Procedures   MEDICATIONS ORDERED IN ED: Medications  cefTRIAXone (ROCEPHIN) 1 g in sodium chloride 0.9 % 100 mL IVPB (has no administration in time range)  iohexol (OMNIPAQUE) 300 MG/ML solution 100 mL (100 mLs Intravenous Contrast Given 01/05/22 0002)     IMPRESSION / MDM / ASSESSMENT AND PLAN / ED COURSE  I reviewed the triage vital signs and the nursing notes. Patient's presentation is most consistent with acute presentation with potential threat to life or bodily function.                               Differential diagnosis includes, but is not limited to recurrent and possibly ongoing cystitis, SBO, ileus, diverticulitis, kidney injury and electrolyte derangements.  EKG without evidence of acute ischemia  CMP without any significant lecture light or metabolic derangements.  Lipase slightly elevated at 61 but overall not suggestive of acute pancreatitis.  CBC without leukocytosis or acute anemia.  UA does appear infected with some hemoglobin and positive nitrates as well as large leukocyte trace and greater than 50 WBCs as well as many bacteria.  CT abdomen pelvis on my interpretation without evidence of ureteral stone, perinephric stranding, diverticulitis, pancreatitis or other acute abdominal or pelvic process.  I also reviewed radiology interpretation and agree with the findings of same.   Most recent urine culture I can see from the visit on 7/5 showed Enterococcus faecalis sensitive to ciprofloxacin, nitrofurantoin and penicillins.  She is already trending on Cipro and reports she got blisters on her tongue from this.  Will prescribe course of Keflex.  At this point I do not believe she is septic she does not seem to have any significant change in her ability to tolerate p.o.  There is no evidence of kidney injury or other significant electrolyte or metabolic  derangement.  I will give her dose of Rocephin this evening and have her follow-up with her PCP and urology.  Discharged in stable condition.  Strict turn precautions advised and discussed.  Refill for Zofran provided     FINAL CLINICAL IMPRESSION(S) / ED DIAGNOSES   Final diagnoses:  Recurrent urinary tract infection  Chronic nausea     Rx /  DC Orders   ED Discharge Orders          Ordered    ondansetron (ZOFRAN-ODT) 4 MG disintegrating tablet  Every 8 hours PRN        01/05/22 0119    cephALEXin (KEFLEX) 500 MG capsule  2 times daily        01/05/22 0119             Note:  This document was prepared using Dragon voice recognition software and may include unintentional dictation errors.   Lucrezia Starch, MD 01/05/22 [phone]

## 2022-01-07 LAB — URINE CULTURE: Culture: >=100000 — AB

## 2022-01-09 DIAGNOSIS — I700 Atherosclerosis of aorta: Secondary | ICD-10-CM | POA: Diagnosis not present

## 2022-01-09 DIAGNOSIS — I10 Essential (primary) hypertension: Secondary | ICD-10-CM | POA: Diagnosis not present

## 2022-01-09 DIAGNOSIS — R634 Abnormal weight loss: Secondary | ICD-10-CM | POA: Diagnosis not present

## 2022-01-09 DIAGNOSIS — R002 Palpitations: Secondary | ICD-10-CM | POA: Diagnosis not present

## 2022-01-09 DIAGNOSIS — R110 Nausea: Secondary | ICD-10-CM | POA: Diagnosis not present

## 2022-01-09 DIAGNOSIS — E782 Mixed hyperlipidemia: Secondary | ICD-10-CM | POA: Diagnosis not present

## 2022-01-11 ENCOUNTER — Encounter: Payer: Self-pay | Admitting: Urology

## 2022-01-11 ENCOUNTER — Ambulatory Visit: Payer: Medicare HMO | Admitting: Urology

## 2022-01-11 ENCOUNTER — Ambulatory Visit
Admission: RE | Admit: 2022-01-11 | Discharge: 2022-01-11 | Disposition: A | Discharge status: Home / Self Care | Payer: Medicare HMO | Source: Ambulatory Visit | Attending: Primary Care | Admitting: Primary Care

## 2022-01-11 VITALS — BP 98/65 | HR 73 | Ht 64.0 in | Wt 137.0 lb

## 2022-01-11 DIAGNOSIS — Z1231 Encounter for screening mammogram for malignant neoplasm of breast: Secondary | ICD-10-CM | POA: Insufficient documentation

## 2022-01-11 DIAGNOSIS — R102 Pelvic and perineal pain: Secondary | ICD-10-CM

## 2022-01-11 DIAGNOSIS — N390 Urinary tract infection, site not specified: Secondary | ICD-10-CM | POA: Diagnosis not present

## 2022-01-11 MED ORDER — CEPHALEXIN 250 MG PO CAPS
250.0000 mg | ORAL_CAPSULE | Freq: Every day | ORAL | 0 refills | Status: DC
Start: 2022-01-11 — End: 2022-05-12

## 2022-01-11 NOTE — Progress Notes (Signed)
   01/11/2022 11:49 AM   Valerie [name] 09/28/1950 [id number]  Reason for visit: Follow up recurrent UTIs, nausea, abdominal pain  HPI: 71 year old female who we have followed extensively for microscopic hematuria and recurrent UTIs and has been on topical estrogen cream and cranberry tablet prophylaxis.  She has had numerous normal CT scans over the last year as well as a normal cystoscopy in September 2022.  We had a very challenging time determining the etiology of her chronic nausea and abdominal pain, as well as determining if she has chronic asymptomatic bacteriuria versus true UTI.  At our last visit we treated an acute symptomatic UTI and started low-dose nitrofurantoin prophylaxis to try to differentiate if her abdominal pain and nausea were related to more of a GI issue versus true recurrent infections.  She reports she never felt better on the antibiotics and low-dose antibiotics.  She really denies any significant urinary symptoms today, and main issue is ongoing nausea and lower abdominal pain.  Most recently, she was seen in the ER on 01/05/2022 and CT abdomen and pelvis with contrast was again completely benign.  I personally viewed and interpreted those films.  Urinalysis at that time was suspicious for infection and culture ultimately grew E. coli, and she has been on culture appropriate Keflex.  She still reports no improvement in her nausea or lower abdominal pain.  I had another very long conversation with the patient and her family including her husband and daughter today regarding her challenging presentation and difficulty identifying true etiology of her symptoms.  With her history and no improvement with antibiotics, I really have a low suspicion that her nausea and abdominal pain are related to urinary tract infections, especially in the setting of minimal to no urinary symptoms.  Additionally, she has had no improvement with culture appropriate antibiotics previously, or with  antibiotic prophylaxis.  I recommended the antibiotic prophylaxis to Keflex 250 mg daily x90 days to again try to rule out any UTIs being related to the nausea and lower abdominal pain.  I again encouraged her to follow-up with GI, it sounds like she is working with her PCP to get set up with them.  Again, she has had a normal cystoscopy within the last year, and numerous normal CT scans.  We could consider repeating a cystoscopy, but very low suspicion that this would be abnormal, and I think she is better served with further GI evaluation with endoscopy/colonoscopy, prior to repeating a cystoscopy.  -Antibiotic prophylaxis changed to Keflex, continue topical estrogen cream and cranberry tablet prophylaxis -Agree with referral to GI for further evaluation of chronic nausea and abdominal pain -RTC 2 to 3 months symptom check -Consider cystoscopy in the future if GI work-up negative   Billey Co, MD  [facility] [address], [place], [place] [address] [phone]

## 2022-01-11 NOTE — Patient Instructions (Signed)
Asymptomatic Bacteriuria Asymptomatic bacteriuria is the presence of a large number of bacteria in the urine without the usual symptoms of burning or frequent urination. This is usually not harmful, and treatment may not be needed. A person with this condition will not be more likely to develop an infection in the future. What are the causes? This condition is caused by an increase in bacteria in the urine. This increase can be caused by: Bacteria entering the urinary tract, such as during sex. A blockage in the urinary tract, such as from kidney stones or a tumor. Bladder problems that prevent the bladder from emptying. What increases the risk? You are more likely to develop this condition if: You have diabetes. You are an older adult. This especially affects older adults in long-term care facilities. You are pregnant and in the first trimester. You have kidney stones. You are female. You have had a kidney transplant. You have a leaky kidney tube valve (reflux). You had a urinary catheter for a long period of time. This is a long, thin tube that collects urine. What are the signs or symptoms? There are no symptoms of this condition. How is this diagnosed? This condition is diagnosed with a urine test. Because this condition does not cause symptoms, it is usually diagnosed when a urine sample is taken to treat or diagnose another condition, such as pregnancy or kidney problems. Most women who are in their first trimester of pregnancy are screened for asymptomatic bacteriuria. How is this treated? Usually, treatment is not needed for this condition. Treating the condition can lead to other problems, such as a yeast infection or the growth of bacteria that do not respond to treatment (antibiotic-resistant bacteria). Some people do need treatment with antibiotic medicines to prevent kidney infection, known as pyelonephritis. Treatment is needed if: You are pregnant. In pregnant women, kidney  infection can lead to: Early labor (premature labor). Very low birth weight (fetal growth restriction). Newborn death. You are having a procedure that affects the urinary tract. You have had a kidney transplant. If you are diagnosed with this condition, talk with your health care provider about any concerns that you have. Follow these instructions at home: Medicines Take over-the-counter and prescription medicines only as told by your health care provider. If you were prescribed an antibiotic medicine, take it as told by your health care provider. Do not stop using the antibiotic even if you start to feel better. General instructions Monitor your condition for any changes. Drink enough fluid to keep your urine pale yellow. Urinate more often to keep your bladder empty. If you are female, keep the area around your vagina and rectum clean. Wipe from front to back after urinating or having a bowel movement. Use each piece of toilet paper only once. Keep all follow-up visits. This is important. Contact a health care provider if: You have symptoms of a urine infection, such as: A burning sensation, or pain when you urinate. A strong need to urinate, or urinating more often. Urine turning discolored or cloudy. Blood in your urine. Urine that smells bad. Get help right away if: You develop signs of a kidney infection, such as: Back pain or pelvic pain. A fever or chills. Nausea or vomiting. Severe pain that cannot be controlled with medicine. Summary Asymptomatic bacteriuria is the presence of a large number of bacteria in the urine without the usual symptoms of burning or frequent urination. Usually, treatment is not needed for this condition. Treating the condition can lead   to other problems, such as a yeast infection or the growth of bacteria that do not respond to treatment. Some people do need treatment. Treatment is needed if you are pregnant, if you are having a procedure that  affects the urinary tract, or if you have had a kidney transplant. If you were prescribed an antibiotic medicine, take it as told by your health care provider. Do not stop using the antibiotic even if you start to feel better. This information is not intended to replace advice given to you by your health care provider. Make sure you discuss any questions you have with your health care provider. Document Revised: 12/26/2019 Document Reviewed: 12/26/2019 Elsevier Patient Education  2023 Elsevier Inc.  

## 2022-01-13 ENCOUNTER — Telehealth: Payer: Self-pay

## 2022-01-13 NOTE — Telephone Encounter (Signed)
Patient and her husband came in the office today to get her schedule for a follow up appointment with dr. Vicente Males. She states her symptoms have not improved and she is having constipation, abdominal pain and nausea. Informed patient that I would send a message to his nurse and she will give you call on what they recommend or if they need to work you in to see Dr. Vicente Males

## 2022-01-17 NOTE — Telephone Encounter (Signed)
Please advise if you want to work patient in or make appointment in January

## 2022-01-17 NOTE — Telephone Encounter (Signed)
Scheduled patient for 05/30/2021 at 3:30 and added patient to wait list. Left patient a detail message with this

## 2022-01-18 ENCOUNTER — Telehealth: Payer: Self-pay | Admitting: Primary Care

## 2022-01-18 NOTE — Telephone Encounter (Signed)
Patient called and stated she has not gotten a call about a referral to GI doctor at [facility] she stated the Doctor name is Amy. Call back number [phone].

## 2022-01-23 ENCOUNTER — Telehealth: Payer: Self-pay | Admitting: Primary Care

## 2022-01-23 NOTE — Telephone Encounter (Signed)
Patient called in asking about referral that was suppose to be sent down to [facility]. States that she still haven't received an phone call.

## 2022-02-07 DIAGNOSIS — R194 Change in bowel habit: Secondary | ICD-10-CM | POA: Diagnosis not present

## 2022-02-07 DIAGNOSIS — R109 Unspecified abdominal pain: Secondary | ICD-10-CM | POA: Diagnosis not present

## 2022-02-07 DIAGNOSIS — Z0000 Encounter for general adult medical examination without abnormal findings: Secondary | ICD-10-CM | POA: Diagnosis not present

## 2022-02-07 DIAGNOSIS — R112 Nausea with vomiting, unspecified: Secondary | ICD-10-CM | POA: Diagnosis not present

## 2022-02-09 NOTE — Telephone Encounter (Signed)
Pt has an appt scheduled with [facility] Jonathon Bellows

## 2022-02-14 ENCOUNTER — Telehealth: Payer: Self-pay

## 2022-02-14 NOTE — Progress Notes (Signed)
    Chronic Care Management Pharmacy Assistant   Name: Valerie [name]  MRN: [id number] DOB: 20-Jul-1950  Reason for Encounter: CCM (Appointment Reminder)   Medications: Outpatient Encounter Medications as of 02/14/2022  Medication Sig   acetaminophen (TYLENOL) 325 MG tablet Take 325 mg by mouth 2 (two) times daily as needed (pain).   acetaminophen (TYLENOL) 500 MG tablet Take 500-1,000 mg by mouth every 6 (six) hours as needed for moderate pain.   amLODipine (NORVASC) 5 MG tablet Take 1 tablet (5 mg total) by mouth daily. For blood pressure.   aspirin EC 81 MG tablet Take 81 mg by mouth daily. Swallow whole.   atorvastatin (LIPITOR) 10 MG tablet Take 1 tablet (10 mg total) by mouth daily. for cholesterol.   CALCIUM-VITAMIN D PO Take 2 tablets by mouth daily.   cephALEXin (KEFLEX) 250 MG capsule Take 1 capsule (250 mg total) by mouth daily.   DULoxetine (CYMBALTA) 20 MG capsule Take 2 capsules (40 mg total) by mouth daily. For anxiety/depression/pain   fluticasone (FLONASE) 50 MCG/ACT nasal spray Place 1 spray into both nostrils 2 (two) times daily.   metoprolol succinate (TOPROL-XL) 50 MG 24 hr tablet TAKE 1 TABLET DAILY FOR HEART RATE/PALPITATIONS   omeprazole (PRILOSEC) 40 MG capsule TAKE 1 CAPSULE DAILY. FOR HEARTBURN.   topiramate (TOPAMAX) 50 MG tablet Take 1 tablet (50 mg total) by mouth daily. For headache prevention   valsartan (DIOVAN) 160 MG tablet Take 160 mg by mouth daily.   No facility-administered encounter medications on file as of 02/14/2022.   [name] was contacted to remind of upcoming telephone visit with [name] on 02/17/2022 at 8:45. Patient was reminded to have any blood glucose and blood pressure readings available for review at appointment.   Message was left reminding patient of appointment.   CCM referral has been placed prior to visit?  No   Star Rating Drugs: Medication:  Last Fill: Day Supply Atorvastatin 10 mg 12/27/2021 90 Valsartan 160  mg 01/26/2022 [place], CPP notified  [name], [name] Pharmacy Assistant [phone]

## 2022-02-17 ENCOUNTER — Ambulatory Visit: Payer: Medicare HMO | Admitting: Pharmacist

## 2022-02-17 DIAGNOSIS — I10 Essential (primary) hypertension: Secondary | ICD-10-CM

## 2022-02-17 DIAGNOSIS — E785 Hyperlipidemia, unspecified: Secondary | ICD-10-CM

## 2022-02-17 DIAGNOSIS — M8580 Other specified disorders of bone density and structure, unspecified site: Secondary | ICD-10-CM

## 2022-02-17 DIAGNOSIS — K219 Gastro-esophageal reflux disease without esophagitis: Secondary | ICD-10-CM

## 2022-02-17 DIAGNOSIS — N390 Urinary tract infection, site not specified: Secondary | ICD-10-CM

## 2022-02-17 DIAGNOSIS — R110 Nausea: Secondary | ICD-10-CM

## 2022-02-17 NOTE — Progress Notes (Signed)
Chronic Care Management Pharmacy Note  02/17/2022 Name:  Valerie [name] MRN:  [id number] DOB:  05-Oct-1950  Summary: CCM F/U visit -Pt reports persistent nausea; she has been referred to [facility] multiple times by multiple providers and has not been able to get appt yet; she reports she called their office and was told there was no referral yet. Per chart review referral was re-faxed 9/1 and 9/12.  Recommendations/Changes made from today's visit: -Coordinate with referral team to follow up on [facility] referral  Plan: -Transition CCM to Self Care: Patient achieved CCM goals and no longer needs to be contacted as frequently. The patient has been provided with contact information for the care management team and has been advised to call with any health related questions or concerns.  -No PCP f/u scheduled. AWV 05/2022   Subjective: Valerie [name] is an 71 y.o. year old female who is a primary patient of Pleas Koch, NP.  The CCM team was consulted for assistance with disease management and care coordination needs.    Engaged with patient by telephone for follow up visit in response to provider referral for pharmacy case management and/or care coordination services.   Consent to Services:  The patient was given information about Chronic Care Management services, agreed to services, and gave verbal consent prior to initiation of services.  Please see initial visit note for detailed documentation.   Patient Care Team: Pleas Koch, NP as PCP - General (Nurse Practitioner) Charlton Haws, [facility] as Pharmacist (Pharmacist)   Recent office visits: 01/03/22 NP Allie Bossier OV: chronic nausea - call [facility] for appt. Avoid trigger foods. Xray concerning for partial obstruction  12/16/21 NP Allie Bossier OV: chronic nausea - referred to [facility]. [name] topamax for headache prevention. Order MRI brain.   06/27/21 NP Allie Bossier OV: annual exam. Increase duloxetine to 40 mg. 06/21/21 LPN Tamina  McCain - AWV 04/27/21 NP Allie Bossier OV: f/u HTN. BP 134/72. No changes.  04/08/21-PCP-Katherine Clark,NP-Patient presented for follow up hypertension.Take only 1 tablet of losartan 142m daily, increase amlodipine to 580monce daily.Labs ordered (Kidneys and electrolytes look good) consider imaging of the brain if headaches persist.  02/23/21-PCP-Katherine Clark,NP-Patient presented for follow up from hospital visit for epigastric and abdominal pain. Work on more hydration with water.  12/08/20-PCP-Katherine Clark,NP-Patient presented for follow up cystitis-Today she endorses that she was called a few days after her ED visit and was told to stop Keflex and to [name] cefdinir 300 mg. She's been taking this BID as directed, will finish in a few days.Stop taking cefdinir antibiotics. [name] Bactrim DS (sulfamethoxazole/trimethoprim) tablets for urinary tract infection. Take 1 tablet by mouth twice daily for 3 days.Referral to [facility],labs ordered. UA today with 3+ blood, negative for leuks and nitrites. Culture sent.   Recent consult visits: 01/11/[facility] pelvic pain - change abx ppx to Keflex daily. Low suspicion that n/v related to UTIs.  01/09/22 [facility] PVD stable, no changed. GI referral.  11/30/21 Dr [facility]): rx cipro. Then [name] Macrobid daily for Ppx. 11/09/21 Dr [facility]): recurrent UTI. Rx'd bactrim. Continue topical estrogen.   09/20/21 Dr [facility]ente [facility]): nausea - add pepcid 40 mg HS. Use wedge pillow. Restart gabapentin. Reduce Linzess to 72 mcg.   07/21/21 Dr [facility]zora [facility]): plan removal of spinal cord stimulator - 08/01/21.  06/29/21 Dr [facility]icente [facility]): f/u constipation, nausea. Advised to decrease Linzess to 145 mcg due to diarrhea. Reduce  sodas (high fructose corn syrup) 06/13/21 Dr Nehemiah Massed (Cardiology): f/u Tachycardia, HTN, atherosclerosis. No med changes. 06/01/21 GI TE - no improvement on Linzess. Change to Trulance 3 mg. 05/17/21  Admission for EGD. 05/12/21 Dr Bonna Gains (GI): f/u nausea. Increased Linzess to 290 mcg. Ordered EGD. Consider Trulance/Amitiza in future, but these are non-preferred. 04/11/21 Dr Nehemiah Massed [facility] cardiology): f/u HTN,CAD. Change ARB to Valsartan 160 mg daily. 04/04/21-Gastroenterology-Varnita Tahiliani,MD-Patient presented for initial visit for epigastric pain, constipation. -Reviewed CT-abdomen/pelvis. Labs ordered (normal) [name] Linzess,consider upper endoscopy. 03/24/21-Cardiology-Amanda Tobin,PA-Patient presented for hypertension and tachycardia. No symptoms at this time, EKG,normal,follow up 9 months 02/21/21-General Surgery-Diego Pabon,MD- Patient presented for initial visit for partial bowel obstruction; No surgical intervention required.Discussed taking fiber supplement, metamucil ,benefiber daily with plenty of fluids and water. Follow up as needed 02/08/21-Urology-Brian Sninsky,MD-Patient presented for cystoscopy procedure. 12/16/20-Uology-Brian Sninsky,MD-Patient presented for recurrent UTI. [name] trial of cranberry tablets and topical estrogen cream,follow up 6 month 12/15/20-Cardiology-Bruce Kowalski,MD-Patient presented for follow up.Continue to monitor palpitations,ECG ordered, increase atorvastatin to 5m take 1 tablet daily,Amlodipine 54mtake 1 tablet daily  [facility]: [facility] ([facility]): try stopping Linzess and [name] stool softener. [facility], zofran. 02/15/21-[facility] ED-Patient presented for palpitations, self-resolved. EKG and Labs normal. CT scan for abdominal tenderness - no acute process, Aortic atherosclerosis.  01/25/21 thru 01/28/21-[facility]-Admission for partial SBO. NG tube placed. Improved. F/u outpatient general surgery PRN. Hold: amlodipine  11/30/20-[facility]-Zachary Smith,MD-Patient presented for epigastric and abdominal pain,xrays,labs ordered,EKG,Will write short course of Keflex.  Patient also given  Carafate and Maalox in the ED and felt little better on reassessment. No admission.   Objective:  Lab Results  Component Value Date   CREATININE 0.84 01/04/2022   BUN 9 01/04/2022   GFR 68.96 01/03/2022   GFRNONAA >60 01/04/2022   GFRAA >60 02/03/2020   NA 138 01/04/2022   K 3.5 01/04/2022   CALCIUM 9.4 01/04/2022   CO2 26 01/04/2022   GLUCOSE 123 (H) 01/04/2022    Lab Results  Component Value Date/Time   HGBA1C 5.6 06/27/2021 08:53 AM   HGBA1C 5.5 06/29/2020 11:00 AM   GFR 68.96 01/03/2022 03:40 PM   GFR 72.34 04/08/2021 12:18 PM    Last diabetic Eye exam: No results found for: "HMDIABEYEEXA"  Last diabetic Foot exam: No results found for: "HMDIABFOOTEX"   Lab Results  Component Value Date   CHOL 136 06/27/2021   HDL 40.60 06/27/2021   LDLCALC 76 06/27/2021   TRIG 97.0 06/27/2021   CHOLHDL 3 06/27/2021       Latest Ref Rng & Units 01/04/2022    8:29 PM 09/15/2021   10:46 AM 04/04/2021   10:29 AM  Hepatic Function  Total Protein 6.5 - 8.1 g/dL 7.2  6.9  7.1   Albumin 3.5 - 5.0 g/dL 4.1  3.8  4.3   AST 15 - 41 U/L _0 ALT 0 - 44 U/L _1 Alk Phosphatase 38 - 126 U/L 59  55  60   Total Bilirubin 0.3 - 1.2 mg/dL 0.7  0.8  0.7   Bilirubin, Direct 0.00 - 0.40 mg/dL   0.19     Lab Results  Component Value Date/Time   TSH 2.10 04/03/2019 09:42 AM       Latest Ref Rng & Units 01/04/2022    8:29 PM 01/03/2022    3:40 PM 09/15/2021   10:46 AM  CBC  WBC 4.0 - 10.5 K/uL 6.5  5.1  6.0   Hemoglobin 12.0 - 15.0 g/dL 12.3  13.0  12.9   Hematocrit 36.0 - 46.0 % 36.6  38.0  39.6   Platelets 150 - 400 K/uL 200  231.0  205     Lab Results  Component Value Date/Time   VD25OH 27.03 (L) 06/29/2020 11:00 AM   VD25OH 24.80 (L) 02/10/2019 09:01 AM    Clinical ASCVD: No  The 10-year ASCVD risk score (Arnett DK, et al., 2019) is: 6.4%   Values used to calculate the score:     Age: 53 years     Sex: Female     Is Non-Hispanic African American: No      Diabetic: No     Tobacco smoker: No     Systolic Blood Pressure: 98 mmHg     Is BP treated: Yes     HDL Cholesterol: 40.6 mg/dL     Total Cholesterol: 136 mg/dL       06/21/2021    3:11 PM 06/29/2020   10:42 AM 10/21/2019   12:04 PM  Depression screen PHQ 2/9  Decreased Interest 0 2 0  Down, Depressed, Hopeless 0 1 0  PHQ - 2 Score 0 3 0  Altered sleeping  2 0  Tired, decreased energy  1 0  Change in appetite  1 0  Feeling bad or failure about yourself   1 0  Trouble concentrating  1 0  Moving slowly or fidgety/restless  3 0  Suicidal thoughts  0 0  PHQ-9 Score  12 0  Difficult doing work/chores  Somewhat difficult Not difficult at all       06/29/2020   10:43 AM  GAD 7 : Generalized Anxiety Score  Nervous, Anxious, on Edge 3  Control/stop worrying 2  Worry too much - different things 2  Trouble relaxing 3  Restless 3  Easily annoyed or irritable 0  Afraid - awful might happen 1  Total GAD 7 Score 14  Anxiety Difficulty Somewhat difficult     Social History   Tobacco Use  Smoking Status Never   Passive exposure: Never  Smokeless Tobacco Never   BP Readings from Last 3 Encounters:  01/11/22 98/65  01/05/22 119/71  01/03/22 110/62   Pulse Readings from Last 3 Encounters:  01/11/22 73  01/05/22 63  01/03/22 73   Wt Readings from Last 3 Encounters:  01/11/22 137 lb (62.1 kg)  01/04/22 135 lb (61.2 kg)  01/03/22 137 lb (62.1 kg)   BMI Readings from Last 3 Encounters:  01/11/22 23.52 kg/m  01/04/22 23.17 kg/m  01/03/22 23.52 kg/m    Assessment/Interventions: Review of patient past medical history, allergies, medications, health status, including review of consultants reports, laboratory and other test data, was performed as part of comprehensive evaluation and provision of chronic care management services.   SDOH:  (Social Determinants of Health) assessments and interventions performed: Yes SDOH Interventions    Flowsheet Row Office Visit from  06/29/2020 in [place] at [place] from 10/21/2019 in [place] at [place] Interventions    Depression Interventions/Treatment  Medication PHQ2-9 Score <4 Follow-up Not Indicated      SDOH Screenings   Food Insecurity: No Food Insecurity (06/21/2021)  Housing: Low Risk  (06/21/2021)  Transportation Needs: No Transportation Needs (06/21/2021)  Alcohol Screen: Low Risk  (06/21/2021)  Depression (PHQ2-9): Low Risk  (06/21/2021)  Financial Resource Strain: Low Risk  (  06/21/2021)  Physical Activity: Sufficiently Active (06/21/2021)  Social Connections: Moderately Integrated (06/21/2021)  Stress: No Stress Concern Present (06/21/2021)  Tobacco Use: Low Risk  (01/11/2022)    CCM Care Plan  No Known Allergies  Medications Reviewed Today     Reviewed by Charlton Haws, [facility] (Pharmacist) on 02/17/22 at [place] List Status: <None>   Medication Order Taking? Sig Documenting Provider Last Dose Status Informant  acetaminophen (TYLENOL) 325 MG tablet [id number] Yes Take 325 mg by mouth 2 (two) times daily as needed (pain). [provider] Taking Active Self  acetaminophen (TYLENOL) 500 MG tablet [id number] Yes Take 500-1,000 mg by mouth every 6 (six) hours as needed for moderate pain. [provider] Taking Active Self  amLODipine (NORVASC) 5 MG tablet [id number] Yes Take 1 tablet (5 mg total) by mouth daily. For blood pressure. Pleas Koch, NP Taking Active   aspirin EC 81 MG tablet [id number] Yes Take 81 mg by mouth daily. Swallow whole. [provider] Taking Active Self  atorvastatin (LIPITOR) 10 MG tablet [id number] Yes Take 1 tablet (10 mg total) by mouth daily. for cholesterol. Pleas Koch, NP Taking Active   CALCIUM-VITAMIN D PO [id number] Yes Take 2 tablets by mouth daily. [provider] Taking Active Self  cephALEXin (KEFLEX) 250 MG capsule [id number] Yes Take 1 capsule (250 mg total) by mouth  daily. Billey Co, MD Taking Active   DULoxetine (CYMBALTA) 20 MG capsule [id number] Yes Take 2 capsules (40 mg total) by mouth daily. For anxiety/depression/pain Pleas Koch, NP Taking Active Self  fluticasone (FLONASE) 50 MCG/ACT nasal spray [id number] Yes Place 1 spray into both nostrils 2 (two) times daily. Pleas Koch, NP Taking Active   metoprolol succinate (TOPROL-XL) 50 MG 24 hr tablet [id number] Yes TAKE 1 TABLET DAILY FOR HEART RATE/PALPITATIONS Pleas Koch, NP Taking Active Self  omeprazole (PRILOSEC) 40 MG capsule [id number] Yes TAKE 1 CAPSULE DAILY. FOR HEARTBURN. Pleas Koch, NP Taking Active Self  topiramate (TOPAMAX) 50 MG tablet [id number] Yes Take 1 tablet (50 mg total) by mouth daily. For headache prevention Pleas Koch, NP Taking Active   valsartan (DIOVAN) 160 MG tablet [id number] Yes Take 160 mg by mouth daily. [provider] Taking Active Self            Patient Active Problem List   Diagnosis Date Noted   Generalized abdominal pain 12/16/2021   Frequent headaches 12/16/2021   Chronic nausea    Gastric erythema    Lactic acidosis    Dehydration    Partial small bowel obstruction ([place]) 01/26/2021   Epigastric pain    Depression    Atherosclerosis of abdominal aorta ([place]) 12/15/2020   Acute cystitis with hematuria 12/08/2020   Microscopic hematuria 12/08/2020   Chronic constipation 08/13/2020   Dysuria 08/13/2020   Encounter for screening colonoscopy    Otalgia of both ears 01/28/2020   Tinnitus of both ears 10/02/2019   ETD (Eustachian tube dysfunction), right 04/29/2019   Weakness of both lower extremities 04/03/2019   Chronic back pain 04/03/2019   Encounter for annual general medical examination with abnormal findings in adult 02/14/2019   Vulvar dermatitis 01/01/2018   Allergic rhinitis 08/22/2017   Neck pain with history of cervical spinal surgery 01/22/2017   Prediabetes 11/16/2016   Hyperlipidemia  11/16/2015   Esophageal reflux    Hemorrhoid 06/30/2015   Atypical chest pain 01/04/2015   Palpitations 01/04/2015   Welcome to Medicare preventive visit 10/09/2014  Benign paroxysmal positional vertigo 08/31/2014   Hernia    Menorrhagia    Osteopenia    Vitamin D deficiency    Adenocarcinoma of the endometrium/uterus ([place])    Generalized anxiety disorder 01/23/2007   Essential hypertension 01/23/2007    Immunization History  Administered Date(s) Administered   Fluad Quad(high Dose 65+) 02/14/2019, 02/23/2021   Hepatitis B 11/25/2015, 05/29/2016   Hepatitis B, adult 09/22/2015   Influenza, High Dose Seasonal PF 05/29/2016   Influenza,inj,Quad PF,6+ Mos 02/11/2018   Influenza-Unspecified 02/27/2015, 04/19/2020   PFIZER(Purple Top)SARS-COV-2 Vaccination 06/20/2019, 07/11/2019, 02/26/2020, 01/04/2021   PNEUMOCOCCAL CONJUGATE-20 03/12/2021   Td 05/29/1997   Tdap 10/09/2014, 09/22/2015   Zoster Recombinat (Shingrix) 01/04/2021, 03/12/2021   Zoster, Live 07/28/2015    Conditions to be addressed/monitored:  Hypertension, Hyperlipidemia, Depression, Anxiety, Osteopenia, and Chronic constipation  Care Plan : [place]  Updates made by Charlton Haws, [place] since 02/17/2022 12:00 AM     Problem: Hypertension, Hyperlipidemia, Depression, Anxiety, Osteopenia, and Chronic constipation/Nausea   Priority: High     Long-Range Goal: Disease mgmt   [name] Date: 04/26/2021  Expected End Date: 04/26/2022  This Visit's Progress: On track  Recent Progress: On track  Priority: High  Note:   Current Barriers:  Persistent nausea  Pharmacist Clinical Goal(s):  Patient will contact provider office for questions/concerns as evidenced notation of same in electronic health record through collaboration with PharmD and provider.   Interventions: 1:1 collaboration with Pleas Koch, NP regarding development and update of comprehensive plan of care as evidenced by  provider attestation and co-signature Inter-disciplinary care team collaboration (see longitudinal plan of care) Comprehensive medication review performed; medication list updated in electronic medical record  Hypertension (BP goal <130/80) -Controlled - pt-reported BP at home is at goal; pt denies headaches, dizziness, lightheadedness; she reports cardiologist recently changed losartan to valsartan and she endorses compliance with this change; white coat syndrome may explain recent high BP in office; she has recent hx of palpitations -Current home readings: 117/70 -Current treatment: Amlodipine 5 mg daily - Appropriate, Effective, Safe, Accessible Metoprolol succinate 50 mg daily - Appropriate, Effective, Safe, Accessible Valsartan 160 mg daily - Appropriate, Effective, Safe, Accessible -Medications previously tried: losartan  -Educated on BP goals and benefits of medications for prevention of heart attack, stroke and kidney damage; Daily salt intake goal < 2300 mg; Exercise goal of 150 minutes per week; Importance of home blood pressure monitoring; -Counseled to monitor BP at home daily, document, and provide log at future appointments; advised to bring BP monitor to office visit with PCP to callibrate -Recommended to continue current medication  Hyperlipidemia: (LDL goal < 70) -Not ideally controlled - LDL is above goal, statin was increased July 2022, no repeat lipids yet; pt endorses compliance with statin -Current treatment: Atorvastatin 10 mg daily - Appropriate, Effective, Safe, Accessible -Educated on Cholesterol goals; Benefits of statin for ASCVD risk reduction; -Recommended to continue current medication  Depression/Anxiety (Goal: manage symptoms) -Improving - pt endorses compliance with duloxetine -PHQ9: 12 (06/2020) - moderate depression -GAD7: 14 (06/2020) - moderate anxiety -Connected with PCP for mental health support -Current treatment: Duloxetine 40 mg daily -  Appropriate, Effective, Safe, Accessible -Educated on daily dosing of duloxetine; discussed it is not meant to be used as PRN and will not provide relief if used this way -Recommend to continue current medication  Osteopenia (Goal prevent fractures) -Controlled - pt is taking Ca/Vitamin D supplement and walks for exercise regularly -Last DEXA Scan: 01/10/21  T-Score femoral neck: -1.2  T-Score total hip: -0.3  T-Score lumbar spine: -1.3  T-Score forearm radius: -1.0  10-year probability of major osteoporotic fracture: 14.8%  10-year probability of hip fracture: 1.5% -Patient is not a candidate for pharmacologic treatment -Current treatment  Calcium-Vitamin D - Appropriate, Effective, Safe, Accessible -Recommend [phone] units of vitamin D daily. Recommend 1200 mg of calcium daily from dietary and supplemental sources. Recommend weight-bearing and muscle strengthening exercises for building and maintaining bone density.  GERD (Goal: manage symptoms) -Controlled- GI has noted that chronic nausea is not likely related to GERD -Current treatment  Omeprazole 40 mg daily - Appropriate, Effective, Safe, Accessible Tums PRN - Appropriate, Effective, Safe, Accessible -Medications previously tried: pepcid -Recommend to continue current medication  Recurrent UTI (Goal: reduce UTI frequency) -Controlled -Follows with urology -Current treatment  Cephalexin 250 mg daily - Appropriate, Effective, Safe, Accessible Estrace cream -Appropriate, Effective, Safe, Accessible -Medications previously tried: macrobid ppx  -Recommended to continue current medication  Chronic nausea (Goal: manage symptoms) -Uncontrolled -Pt has seen multiple GI doctors to evaluate nausea and no one has been able to pinpoint the problem; she has been referred to [facility] but has not received call to schedule, she tried calling herself and was told they did not have a referral yet -Plan: coordinate with referral team to  check on [facility] referral  Health Maintenance -Vaccine gaps: none  Patient Goals/Self-Care Activities Patient will:  - take medications as prescribed as evidenced by patient report and record review focus on medication adherence by pill box check blood pressure daily, document, and provide at future appointments target a minimum of 150 minutes of moderate intensity exercise weekly -Contact GI for follow up appt/nausea medication      Medication Assistance: None required.  Patient affirms current coverage meets needs.  Compliance/Adherence/Medication fill history: Care Gaps: Annual wellness visit in last year? Yes 06/27/21  Star-Rating Drugs: Atorvastatin 20 mg - LF 03/11/21 x 90 ds; PDC 70% Valsartan - PDC 100%  Medication Access: Within the past 30 days, how often has patient missed a dose of medication? 0 Is a pillbox or other method used to improve adherence? Yes  Factors that may affect medication adherence? no barriers identified Are meds synced by current pharmacy? No  Are meds delivered by current pharmacy? Yes  Does patient experience delays in picking up medications due to transportation concerns? No   [facility] Reviewed: Is patient disadvantaged to use [facility]?: Yes  Current Rx insurance plan: [employer] Name and location of Current pharmacy:  [facility] [address] [place] - [address] [place] [address] Phone: [phone] Fax: [phone]  [place] [place] [address] Phone: [phone] Fax: [phone]  [facility] services reviewed with patient today?: No  Patient requests to transfer care to [facility]?: No  Reason patient declined to change pharmacies: Disadvantaged due to insurance/mail order  Care Plan and Follow Up Patient Decision:  Patient agrees to Care Plan and Follow-up.  Plan: The patient has been  provided with contact information for the care management team and has been advised to call with any health related questions or concerns.   [name], PharmD, BCACP Clinical Pharmacist [facility] [phone]

## 2022-02-17 NOTE — Patient Instructions (Addendum)
Visit Information  Phone number for Pharmacist: [phone]   Goals Addressed   None     Care Plan : [place]  Updates made by Charlton Haws, [place] since 02/17/2022 12:00 AM     Problem: Hypertension, Hyperlipidemia, Depression, Anxiety, Osteopenia, and Chronic constipation/Nausea   Priority: High     Long-Range Goal: Disease mgmt   Start Date: 04/26/2021  Expected End Date: 04/26/2022  This Visit's Progress: On track  Recent Progress: On track  Priority: High  Note:   Current Barriers:  Persistent nausea  Pharmacist Clinical Goal(s):  Patient will contact provider office for questions/concerns as evidenced notation of same in electronic health record through collaboration with PharmD and provider.   Interventions: 1:1 collaboration with Pleas Koch, NP regarding development and update of comprehensive plan of care as evidenced by provider attestation and co-signature Inter-disciplinary care team collaboration (see longitudinal plan of care) Comprehensive medication review performed; medication list updated in electronic medical record  Hypertension (BP goal <130/80) -Controlled - pt-reported BP at home is at goal; pt denies headaches, dizziness, lightheadedness; she reports cardiologist recently changed losartan to valsartan and she endorses compliance with this change; white coat syndrome may explain recent high BP in office; she has recent hx of palpitations -Current home readings: 117/70 -Current treatment: Amlodipine 5 mg daily - Appropriate, Effective, Safe, Accessible Metoprolol succinate 50 mg daily - Appropriate, Effective, Safe, Accessible Valsartan 160 mg daily - Appropriate, Effective, Safe, Accessible -Medications previously tried: losartan  -Educated on BP goals and benefits of medications for prevention of heart attack, stroke and kidney damage; Daily salt intake goal < 2300 mg; Exercise goal of 150 minutes per week; Importance of  home blood pressure monitoring; -Counseled to monitor BP at home daily, document, and provide log at future appointments; advised to bring BP monitor to office visit with PCP to callibrate -Recommended to continue current medication  Hyperlipidemia: (LDL goal < 70) -Not ideally controlled - LDL is above goal, statin was increased July 2022, no repeat lipids yet; pt endorses compliance with statin -Current treatment: Atorvastatin 10 mg daily - Appropriate, Effective, Safe, Accessible -Educated on Cholesterol goals; Benefits of statin for ASCVD risk reduction; -Recommended to continue current medication  Depression/Anxiety (Goal: manage symptoms) -Improving - pt endorses compliance with duloxetine -PHQ9: 12 (06/2020) - moderate depression -GAD7: 14 (06/2020) - moderate anxiety -Connected with PCP for mental health support -Current treatment: Duloxetine 40 mg daily - Appropriate, Effective, Safe, Accessible -Educated on daily dosing of duloxetine; discussed it is not meant to be used as PRN and will not provide relief if used this way -Recommend to continue current medication  Osteopenia (Goal prevent fractures) -Controlled - pt is taking Ca/Vitamin D supplement and walks for exercise regularly -Last DEXA Scan: 01/10/21   T-Score femoral neck: -1.2  T-Score total hip: -0.3  T-Score lumbar spine: -1.3  T-Score forearm radius: -1.0  10-year probability of major osteoporotic fracture: 14.8%  10-year probability of hip fracture: 1.5% -Patient is not a candidate for pharmacologic treatment -Current treatment  Calcium-Vitamin D - Appropriate, Effective, Safe, Accessible -Recommend [phone] units of vitamin D daily. Recommend 1200 mg of calcium daily from dietary and supplemental sources. Recommend weight-bearing and muscle strengthening exercises for building and maintaining bone density.  GERD (Goal: manage symptoms) -Controlled- GI has noted that chronic nausea is not likely related to  GERD -Current treatment  Omeprazole 40 mg daily - Appropriate, Effective, Safe, Accessible Tums PRN - Appropriate, Effective, Safe, Accessible -Medications previously  tried: pepcid -Recommend to continue current medication  Recurrent UTI (Goal: reduce UTI frequency) -Controlled -Follows with urology -Current treatment  Cephalexin 250 mg daily - Appropriate, Effective, Safe, Accessible Estrace cream -Appropriate, Effective, Safe, Accessible -Medications previously tried: macrobid ppx  -Recommended to continue current medication  Chronic nausea (Goal: manage symptoms) -Uncontrolled -Pt has seen multiple GI doctors to evaluate nausea and no one has been able to pinpoint the problem; she has been referred to [facility] but has not received call to schedule, she tried calling herself and was told they did not have a referral yet -Plan: coordinate with referral team to check on [facility] referral  Health Maintenance -Vaccine gaps: none  Patient Goals/Self-Care Activities Patient will:  - take medications as prescribed as evidenced by patient report and record review focus on medication adherence by pill box check blood pressure daily, document, and provide at future appointments target a minimum of 150 minutes of moderate intensity exercise weekly -Contact GI for follow up appt/nausea medication        Patient verbalizes understanding of instructions and care plan provided today and agrees to view in [place]. Active MyChart status and patient understanding of how to access instructions and care plan via MyChart confirmed with patient.    The patient has been provided with contact information for the care management team and has been advised to call with any health related questions or concerns.   [name], PharmD, BCACP Clinical Pharmacist [facility] [phone]

## 2022-03-06 ENCOUNTER — Telehealth: Payer: Self-pay | Admitting: Gastroenterology

## 2022-03-06 NOTE — Telephone Encounter (Signed)
Medical records for 07/27/2020 were mailed out to [facility] on 03/06/2022

## 2022-03-06 NOTE — Telephone Encounter (Signed)
Medical records for procedure

## 2022-03-08 ENCOUNTER — Telehealth: Payer: Self-pay | Admitting: Gastroenterology

## 2022-03-08 NOTE — Telephone Encounter (Signed)
error 

## 2022-03-08 NOTE — Telephone Encounter (Signed)
PT medical records were sent out on 03/08/2022 for notes from 2021-present to [facility] [place] [phone]

## 2022-03-09 ENCOUNTER — Ambulatory Visit: Payer: Medicare HMO | Admitting: Gastroenterology

## 2022-03-13 ENCOUNTER — Other Ambulatory Visit: Payer: Self-pay | Admitting: Primary Care

## 2022-03-14 ENCOUNTER — Encounter: Payer: Self-pay | Admitting: Internal Medicine

## 2022-03-14 ENCOUNTER — Ambulatory Visit: Payer: Medicare HMO | Admitting: Internal Medicine

## 2022-03-14 ENCOUNTER — Telehealth: Payer: Self-pay | Admitting: Primary Care

## 2022-03-14 VITALS — BP 112/78 | HR 87 | Temp 97.2°F | Resp 16 | Ht 63.0 in | Wt 133.8 lb

## 2022-03-14 DIAGNOSIS — H1013 Acute atopic conjunctivitis, bilateral: Secondary | ICD-10-CM

## 2022-03-14 DIAGNOSIS — J3089 Other allergic rhinitis: Secondary | ICD-10-CM

## 2022-03-14 DIAGNOSIS — Z23 Encounter for immunization: Secondary | ICD-10-CM | POA: Diagnosis not present

## 2022-03-14 DIAGNOSIS — Z91018 Allergy to other foods: Secondary | ICD-10-CM

## 2022-03-14 MED ORDER — FLUTICASONE PROPIONATE 50 MCG/ACT NA SUSP: 2.0000 | Freq: Every day | NASAL | 2 refills | Status: DC

## 2022-03-14 MED ORDER — AZELASTINE HCL 0.1 % NA SOLN
1.0000 | Freq: Two times a day (BID) | NASAL | 2 refills | Status: DC
Start: 2022-03-14 — End: 2022-05-16

## 2022-03-14 MED ORDER — EPINEPHRINE 0.3 MG/0.3ML IJ SOAJ
0.3000 mg | INTRAMUSCULAR | 1 refills | Status: DC
Start: 2022-03-14 — End: 2022-11-14

## 2022-03-14 MED ORDER — OLOPATADINE HCL 0.2 % OP SOLN: 1.0000 [drp] | Freq: Every day | OPHTHALMIC | 2 refills | Status: DC | PRN

## 2022-03-14 MED ORDER — CETIRIZINE HCL 10 MG PO TABS
10.0000 mg | ORAL_TABLET | Freq: Every day | ORAL | 2 refills | Status: DC | PRN
Start: 2022-03-14 — End: 2022-05-16

## 2022-03-14 NOTE — Telephone Encounter (Signed)
Spoke with patient. Pharmacy will resend request

## 2022-03-14 NOTE — Addendum Note (Signed)
Addended by: Eloy End D on: 03/14/2022 06:25 PM   Modules accepted: Orders

## 2022-03-14 NOTE — Telephone Encounter (Signed)
Patient called about a fax for atorvastatin (LIPITOR) 10 MG tablet if it has been received.

## 2022-03-14 NOTE — Progress Notes (Signed)
NEW PATIENT  Date of Service/Encounter:  03/14/22  Consult requested by: Pleas Koch, NP   Subjective:   Valerie [name] (DOB: Oct 02, 1950) is a 71 y.o. female who presents to the clinic on 03/14/2022 with a chief complaint of Establish Care (Flu shot questions, Alpha gal syndrome, stays nauseous all the time even without eating, epi pen training requested.) and Allergy Testing (Food: dairy/Environmental) .    History obtained from: chart review and patient.  Rhinitis:  Started around age 36s Symptoms include: nasal congestion, rhinorrhea, post nasal drainage, watery eyes, and itchy eyes  Occurs seasonally-Spring and Fall Potential triggers: pollen Treatments tried: Flonase PRN does help. No anti-histamines.   Previous allergy testing: no History of reflux/heartburn: yes controlled with Prilosec History of chronic sinusitis or sinus surgery: no   Alpha Gal Allergy Reports being diagnosed by Ferd Hibbs NP just a few months ago.  She ate a beef burger and had itching and hives several hours later.  She was then told to avoid all mammalian meats and has continued to do so.  She eats chicken, [place], seafood.  No other reactions since then.  She mows the lawn but can't recall any tick bites.  She has an Epipen.  She also wonders if she has other food allergies because she feels nauseous all the time.  She is going to follow up with GI soon.  She avoids dairy.    Past Medical History: Past Medical History:  Diagnosis Date   Acid reflux    Adenocarcinoma of the endometrium/uterus ([place]) 12/04/2000   Anal fissure    Anxiety    Atrophic vaginitis    COVID-19 virus infection 09/28/2020   Difficult intubation    limited neck mobility   Esophagitis    Hernia    Present to proximal to umbillical    History of hiatal hernia    Hyperlipidemia    Hypertension    Internal hemorrhoids    Menorrhagia    Osteopenia    PONV (postoperative nausea and vomiting)    Recurrent UTI     Vertigo    Vitamin D deficiency     Past Surgical History: Past Surgical History:  Procedure Laterality Date   21 HOUR [place] STUDY N/A 11/29/2015   Procedure: 24 HOUR [place] STUDY;  Surgeon: Manus Gunning, MD;  Location: [facility] ENDOSCOPY;  Service: Gastroenterology;  Laterality: N/A;   ABDOMINAL HYSTERECTOMY  12/04/2000   also bilateral salpingo-oophorectomy   CARDIAC CATHETERIZATION  2006   CARPAL TUNNEL RELEASE  11/20/2011   Procedure: CARPAL TUNNEL RELEASE;  Surgeon: Elaina Hoops, MD;  Location: [place] [facility];  Service: Neurosurgery;  Laterality: Left;  LEFT carpal tunnel release   CERVICAL SPINE SURGERY  2013   [place]   COLONOSCOPY WITH PROPOFOL N/A 07/27/2020   Procedure: COLONOSCOPY WITH PROPOFOL;  Surgeon: Lucilla Lame, MD;  Location[facility] END[facility]COPY;  Service: Endoscopy;  Laterality: N/A;   DILATION AND CURETTAGE OF UTERUS     11/15/2000   ESOPHAGEAL MANOMETRY N/A 09/20/2015   Procedure: ESOPHAGEAL MANOMETRY (EM);  Surgeon: Manus Gunning, MD;  Location: [facility] ENDOSCOPY;  Service: Gastroenterology;  Laterality: N/A;   ESOPHAGOGASTRODUODENOSCOPY (EGD) WITH PROPOFOL N/A 05/17/2021   Procedure: ESOPHAGOGASTRODUODENOSCOPY (EGD) WITH PROPOFOL;  Surgeon: Virgel Manifold, MD;  Location: [facility] ENDOSCOPY;  Service: Endoscopy;  Laterality: N/A;   HYSTEROSCOPY  11/15/2000   D & C, resection of endometrial polyps   LUMBAR SPINAL CORD SIMULATOR LEAD REMOVAL N/A 08/01/2021  Procedure: REMOVAL OF SPINAL CORD STIMULATOR;  Surgeon: Meade Maw, MD;  Location: [facility] ORS;  Service: Neurosurgery;  Laterality: N/A;   OOPHORECTOMY  12/04/2000   bilateral salpingo-oophorectomy done with TAH   SPINAL CORD STIMULATOR INSERTION Bilateral 04/12/2020   Procedure: INSERTION CERVICAL SPINAL STIMULATOR PULSE GENERATOR;  Surgeon: [name], MD;  Location: [facility] ORS;  Service: Neurosurgery;  Laterality: Bilateral;   SPINAL CORD STIMULATOR TRIAL N/A 03/22/2020   Procedure:  CERVICAL SPINAL CORD STIMULATOR TRIAL PERCUTANEOUS;  Surgeon: [name], MD;  Location: [facility] ORS;  Service: Neurosurgery;  Laterality: N/A;   TUBAL LIGATION      Family History: Family History  Problem Relation Age of Onset   Hypertension Father    Prostate cancer Father    Hypertension Sister    Lung cancer Sister    Diabetes Brother    Heart disease Brother    Hypertension Brother    Diabetes Brother    Heart disease Brother    Breast cancer Neg Hx     Social History:  Lives in a 60 year house Flooring in bedroom: carpet Pets: none Tobacco use/exposure: none Job: retired   Medication List:  Allergies as of 03/14/2022   No Known Allergies      Medication List        Accurate as of March 14, 2022 12:56 PM. If you have any questions, ask your nurse or doctor.          acetaminophen 500 MG tablet Commonly known as: TYLENOL Take 500-1,000 mg by mouth every 6 (six) hours as needed for moderate pain.   acetaminophen 325 MG tablet Commonly known as: TYLENOL Take 325 mg by mouth 2 (two) times daily as needed (pain).   amLODipine 5 MG tablet Commonly known as: NORVASC Take 1 tablet (5 mg total) by mouth daily. For blood pressure.   aspirin EC 81 MG tablet Take 81 mg by mouth daily. Swallow whole.   atorvastatin 10 MG tablet Commonly known as: LIPITOR Take 1 tablet (10 mg total) by mouth daily. for cholesterol.   azelastine 0.1 % nasal spray Commonly known as: ASTELIN Place 1 spray into both nostrils 2 (two) times daily. Use in each nostril as directed Started by: Larose Kells, MD   CALCIUM-VITAMIN D PO Take 2 tablets by mouth daily.   cephALEXin 250 MG capsule Commonly known as: Keflex Take 1 capsule (250 mg total) by mouth daily.   cetirizine 10 MG tablet Commonly known as: ZYRTEC Take 1 tablet (10 mg total) by mouth daily as needed for allergies. Started by: Larose Kells, MD   DULoxetine 20 MG capsule Commonly known as: Cymbalta Take 2  capsules (40 mg total) by mouth daily. For anxiety/depression/pain   EPINEPHrine 0.3 mg/0.3 mL [place] injection Commonly known as: EPI-PEN Inject 0.3 mg into the muscle as directed. What changed: how much to take Changed by: Larose Kells, MD   fluticasone 50 MCG/ACT nasal spray Commonly known as: FLONASE Place 2 sprays into both nostrils daily. What changed:  how much to take when to take this Changed by: Larose Kells, MD   metoprolol succinate 50 MG 24 hr tablet Commonly known as: TOPROL-XL TAKE 1 TABLET DAILY FOR HEART RATE/PALPITATIONS   Olopatadine HCl 0.2 % Soln Apply 1 drop to eye daily as needed (itchy watery eyes). Started by: Larose Kells, MD   omeprazole 40 MG capsule Commonly known as: PRILOSEC TAKE 1 CAPSULE DAILY. FOR HEARTBURN.   topiramate 50 MG tablet  Commonly known as: Topamax Take 1 tablet (50 mg total) by mouth daily. For headache prevention   valsartan 160 MG tablet Commonly known as: DIOVAN Take 160 mg by mouth daily.         REVIEW OF SYSTEMS: Pertinent positives and negatives discussed in HPI.   Objective:   Physical Exam: BP 112/78   Pulse 87   Temp (!) 97.2 F (36.2 C) (Temporal)   Resp 16   Ht '5\' 3"'$  (1.6 m)   Wt 133 lb 12.8 oz (60.7 kg)   SpO2 96%   BMI 23.70 kg/m  Body mass index is 23.7 kg/m. GEN: alert, well developed HEENT: clear conjunctiva, TM grey and translucent, nose with + inferior turbinate hypertrophy, pink nasal mucosa, slight clear rhinorrhea, + cobblestoning HEART: regular rate and rhythm, no murmur LUNGS: clear to auscultation bilaterally, no coughing, unlabored respiration ABDOMEN: soft, non distended  SKIN: no rashes or lesions  Reviewed:  No records   Skin Testing:  Skin prick testing was placed, which includes aeroallergens/foods, histamine control, and saline control.  Verbal consent was obtained prior to placing test.  We discussed risks including anaphylaxis. Patient tolerated procedure well.   Allergy testing results were read and interpreted by myself, documented by clinical staff. Adequate positive and negative control.  Results discussed with patient/family.  Airborne Adult Perc - 03/14/22 1050     Time Antigen Placed 1055    Allergen Manufacturer Greer    Location Back    Number of Test 59    1. Control-Buffer 50% Glycerol Negative    2. Control-Histamine 1 mg/ml 4+    3. Albumin saline Negative    4. [place] Negative    5. [place] Negative    6. Johnson Negative    7. [place] Blue Negative    8. Meadow Fescue Negative    9. Perennial Rye Negative    10. Sweet Vernal Negative    11. Timothy Negative    12. Cocklebur Negative    13. Burweed Marshelder Negative    14. Ragweed, short Negative    15. Ragweed, Giant Negative    16. Plantain,  English Negative    17. Lamb's Quarters Negative    18. Sheep Sorrell Negative    19. Rough Pigweed Negative    20. Marsh Elder, Rough Negative    21. Mugwort, Common Negative    22. Ash mix Negative    23. Birch mix Negative    24. Beech American Negative    25. Box, Elder Negative    26. Cedar, red Negative    27. Cottonwood, [place] Negative    28. Elm mix Negative    29. Hickory Negative    30. Maple mix Negative    31. Oak, [place] mix Negative    32. Pecan Pollen Negative    33. Pine mix Negative    34. Sycamore Eastern Negative    35. Tornado, Black Pollen Negative    36. Alternaria alternata Negative    37. Cladosporium Herbarum Negative    38. Aspergillus mix Negative    39. Penicillium mix Negative    40. Bipolaris sorokiniana (Helminthosporium) Negative    41. Drechslera spicifera (Curvularia) Negative    42. Mucor plumbeus Negative    43. Fusarium moniliforme Negative    44. Aureobasidium pullulans (pullulara) Negative    45. Rhizopus oryzae Negative    46. Botrytis cinera Negative    47. Epicoccum nigrum Negative    48. Phoma betae Negative  49. Candida Albicans Negative    50. Trichophyton  mentagrophytes Negative    51. Mite, D Farinae  5,000 AU/ml Negative    52. Mite, D Pteronyssinus  5,000 AU/ml Negative    53. Cat Hair 10,000 BAU/ml Negative    54.  Dog Epithelia 3+    55. Mixed Feathers Negative    56. Horse Epithelia Negative    57. Cockroach, German Negative    58. Mouse Negative    59. Tobacco Leaf Negative             Intradermal - 03/14/22 1135     Time Antigen Placed 1140    Allergen Manufacturer Greer    Location Arm    Number of Test 14    Control Negative    Guatem[place] Negative    Johnson Negative    7 Grass Negative    Ragweed mix Negative    Weed mix Negative    Tree mix Negative    Mold 1 3+    Mold 2 3+    Mold 3 3+    Mold 4 3+    Cat 3+    Cockroach Negative    Mite mix Negative               Assessment:   1. Allergy to alpha-gal   2. Allergic conjunctivitis of both eyes   3. Perennial allergic rhinitis   4. Need for immunization against influenza     Plan/Recommendations:   Mammalian Meat Allergy (galactose-alpha-1, 3-galactose allergy AKA Alpha Gal): - Strictly avoid all mammalian meat (beef, pork, venison, lamb, goat, and bison, etc) - You may continue to eat all poultry and seafood - Always carry your EpiPen with you at all times to be used in case of severe reaction - Will obtain labs today. - Will give Flu shot today.  [facility] told her they can't give it due to alpha gal allergy.  Perennial Allergic Rhinitis Allergic Conjunctivitis - Positive skin test 02/2022 to cat, dog, mold - Avoidance measures discussed. - Use nasal saline rinses before nose sprays such as with Neilmed Sinus Rinse.  Use distilled water.   - Use Flonase 2 sprays each nostril daily. Aim upward and outward. - Use Azelastine 1 sprays each nostril twice daily as needed. Aim upward and outward. - Use Olopatadine 1 eye drops as needed daily for itchy watery eyes.   - Use Zyrtec 10 mg daily as needed for runny nose and itchy watery  eyes.  Return in about 2 months (around 05/14/2022).  Harlon Flor, MD [facility]

## 2022-03-14 NOTE — Patient Instructions (Addendum)
Return in about 2 months (around 05/14/2022).    Mammalian Meat Allergy (galactose-alpha-1, 3-galactose allergy AKA Alpha Gal): - Strictly avoid all mammalian meat (beef, pork, venison, lamb, goat, and bison, etc) - You may continue to eat all poultry and seafood - Always carry your EpiPen with you at all times to be used in case of severe reaction - Will obtain labs today. - Okay to get flu shot   Rhinitis: - Positive skin test to: cat, dog, mold - Avoidance measures discussed. - Use nasal saline rinses before nose sprays such as with Neilmed Sinus Rinse.  Use distilled water.   - Use Flonase 2 sprays each nostril daily. Aim upward and outward. - Use Azelastine 1 sprays each nostril twice daily as needed. Aim upward and outward. - Use Olopatadine 1 eye drops as needed daily for itchy watery eyes.   - Use Zyrtec 10 mg daily as needed for runny nose and itchy watery eyes.  ALLERGEN AVOIDANCE MEASURES  Molds - Indoor avoidance Use air conditioning to reduce indoor humidity.  Do not use a humidifier. Keep indoor humidity at 30 - 40%.  Use a dehumidifier if needed. In the bathroom use an exhaust fan or open a window after showering.  Wipe down damp surfaces after showering.  Clean bathrooms with a mold-killing solution (diluted bleach, or products like Tilex, etc) at least once a month. In the kitchen use an exhaust fan to remove steam from cooking.  Throw away spoiled foods immediately, and empty garbage daily.  Empty water pans below self-defrosting refrigerators frequently. Vent the clothes dryer to the outside. Limit indoor houseplants; mold grows in the dirt.  No houseplants in the bedroom. Remove carpet from the bedroom. Encase the mattress and box springs with a zippered encasing.  Molds - Outdoor avoidance Avoid being outside when the grass is being mowed, or the ground is tilled. Avoid playing in leaves, pine straw, hay, etc.  Dead plant materials contain mold. Avoid going  into barns or grain storage areas. Remove leaves, clippings and compost from around the home.

## 2022-03-15 ENCOUNTER — Telehealth: Payer: Self-pay | Admitting: *Deleted

## 2022-03-15 NOTE — Patient Outreach (Signed)
  Care Coordination   Initial Visit Note   03/15/2022 Name: Valerie [name] MRN: [id number] DOB: 12-Sep-1950  Valerie [name] is a 71 y.o. year old female who sees Pleas Koch, NP for primary care. I spoke with  [name] by phone today.  What matters to the patients health and wellness today?  I really don't know. Patient is being followed by chronic care management pharmacist    Goals Addressed             This Visit's Progress    Patient advised to follow up on AWV and Vaccines          SDOH assessments and interventions completed:  Yes     Care Coordination Interventions Activated:  Yes  Care Coordination Interventions:  Yes, provided   Follow up plan:  Patient is being followed by [facility]    Encounter Outcome:  Pt. Visit Completed   [place] [facility] [phone]

## 2022-03-16 ENCOUNTER — Ambulatory Visit: Payer: Medicare HMO | Admitting: Urology

## 2022-03-16 ENCOUNTER — Encounter: Payer: Self-pay | Admitting: Urology

## 2022-03-16 ENCOUNTER — Telehealth: Payer: Self-pay | Admitting: Gastroenterology

## 2022-03-16 VITALS — BP 112/74 | HR 76 | Ht 63.0 in | Wt 135.0 lb

## 2022-03-16 DIAGNOSIS — N390 Urinary tract infection, site not specified: Secondary | ICD-10-CM

## 2022-03-16 DIAGNOSIS — N3281 Overactive bladder: Secondary | ICD-10-CM | POA: Diagnosis not present

## 2022-03-16 DIAGNOSIS — R3915 Urgency of urination: Secondary | ICD-10-CM

## 2022-03-16 DIAGNOSIS — R351 Nocturia: Secondary | ICD-10-CM | POA: Diagnosis not present

## 2022-03-16 DIAGNOSIS — N3941 Urge incontinence: Secondary | ICD-10-CM

## 2022-03-16 LAB — MICROSCOPIC EXAMINATION: WBC, UA: >30 /hpf — AB (ref 0–5)

## 2022-03-16 LAB — ALPHA-GAL PANEL
Allergen Lamb IgE: 0.61 kU/L — AB
Beef IgE: 0.65 kU/L — AB
IgE (Immunoglobulin E), Serum: 162 IU/mL (ref 6–495)
O215-IgE Alpha-Gal: 15.9 kU/L — AB
Pork IgE: 0.37 kU/L — AB

## 2022-03-16 LAB — URINALYSIS, COMPLETE
Bilirubin, UA: NEGATIVE
Glucose, UA: NEGATIVE
Ketones, UA: NEGATIVE
Nitrite, UA: NEGATIVE
Protein,UA: NEGATIVE
Specific Gravity, UA: <1.005 — ABNORMAL LOW (ref 1.005–1.030)
Urobilinogen, Ur: 0.2 mg/dL (ref 0.2–1.0)
pH, UA: 5.5 (ref 5.0–7.5)

## 2022-03-16 MED ORDER — MIRABEGRON ER 25 MG PO TB24: 25.0000 mg | ORAL_TABLET | Freq: Every day | ORAL | 0 refills | Status: DC

## 2022-03-16 NOTE — Telephone Encounter (Signed)
Pts medical records were mailed out on [id number] from [facility]  from 07/07/2020 to [facility]

## 2022-03-16 NOTE — Telephone Encounter (Signed)
Medical records sent [id number]

## 2022-03-16 NOTE — Progress Notes (Signed)
   03/16/2022 3:38 PM   Valerie [name] Jun 13, 1950 [id number]  Reason for visit: Follow up recurrent UTIs, OAB, nausea, abdominal pain  HPI: 71 year old female who we have followed extensively for microscopic hematuria and recurrent UTIs and has been on topical estrogen cream and cranberry tablet prophylaxis.  She has had numerous normal CT scans over the last year as well as a normal cystoscopy in September 2022.  We had a very challenging time determining the etiology of her chronic nausea and abdominal pain, as well as determining if she has chronic asymptomatic bacteriuria versus true UTI.  She has had no change in her nausea and lower abdominal pain previously when treated for antibiotics for possible UTIs.  She reports she was treated for a UTI by her PCP within the last month, but those records are not available to me.  She denies any improvement in nausea or lower abdominal pain after antibiotics.  She does report about 1 to 2 months of urinary urgency, and urge incontinence as well as nocturia.  Urinalysis today pending.  She is working on finding a different gastroenterologist for further evaluation of her chronic nausea and chronic abdominal pain.  I had another very long conversation with the patient and her family including her husband today regarding her challenging presentation and difficulty identifying true etiology of her symptoms.  With her history and no improvement with antibiotics, I really have a low suspicion that her nausea and abdominal pain are related to urinary tract infections.  Additionally, she has had no improvement with culture appropriate antibiotics previously, or with antibiotic prophylaxis.  Okay to continue the antibiotic prophylaxis to Keflex 250 mg daily x90 days to again try to rule out any UTIs being related to the nausea and lower abdominal pain.  I again encouraged her to follow-up with GI, it sounds like she is working with her PCP to get set up with them.   Again, she has had a normal cystoscopy within the last year, and numerous normal CT scans.  We could consider repeating a cystoscopy, but very low suspicion that this would be abnormal, and I think she is better served with further GI evaluation with endoscopy/colonoscopy, prior to repeating a cystoscopy.  -Agree with referral to GI for further evaluation of chronic abdominal pain and nausea -We will call with urinalysis and culture results from today -Trial of Myrbetriq 25 mg daily for OAB symptoms   Billey Co, MD  [facility] [address] [place], [place] [address] [phone]

## 2022-03-17 ENCOUNTER — Telehealth: Payer: Self-pay | Admitting: *Deleted

## 2022-03-17 ENCOUNTER — Telehealth: Payer: Self-pay | Admitting: Internal Medicine

## 2022-03-17 MED ORDER — SULFAMETHOXAZOLE-TRIMETHOPRIM 800-160 MG PO TABS: 1.0000 | ORAL_TABLET | Freq: Two times a day (BID) | ORAL | 0 refills | Status: AC

## 2022-03-17 NOTE — Telephone Encounter (Signed)
-----   Message from Billey Co, MD sent at 03/16/2022  4:59 PM EDT ----- Recommend Bactrim DS twice daily for 5 days for suspected UTI on urine sample, can hold off taking the Myrbetriq for 1 week, but if persistent urgency/frequency/urge incontinence can start the Myrbetriq at that time.  We will call if culture results require antibiotic change  Nickolas Madrid, MD 03/16/2022

## 2022-03-17 NOTE — Telephone Encounter (Signed)
.  left message to have patient return my call.  

## 2022-03-17 NOTE — Telephone Encounter (Signed)
Notified patient as instructed, patient pleased. Discussed follow-up appointments, patient agrees. Medication sent

## 2022-03-17 NOTE — Telephone Encounter (Signed)
Error

## 2022-03-22 ENCOUNTER — Telehealth: Payer: Self-pay

## 2022-03-22 LAB — CULTURE, URINE COMPREHENSIVE

## 2022-03-22 NOTE — Telephone Encounter (Signed)
I left a message for the patient to return my call.

## 2022-03-22 NOTE — Telephone Encounter (Signed)
Pt states bactrim has caused mouth sores/ulcers. She is unable to eat due to the pain.   She completed only  3 days of bactrim. Last dose on Saturday. She is not currently having any uti sx. She is aware she can take myrbetriq if needed.   [address].   Pls advise.

## 2022-03-22 NOTE — Telephone Encounter (Signed)
-----   Message from Billey Co, MD sent at 03/22/2022  2:35 PM EDT ----- Bactrim was correct abx for UTI, ok to take the mybetriq samples if persistent urgency/frequency after completing abx  Nickolas Madrid, MD 03/22/2022

## 2022-03-23 LAB — MYCOPLASMA / UREAPLASMA CULTURE
Mycoplasma hominis Culture: NEGATIVE
Ureaplasma urealyticum: NEGATIVE

## 2022-03-23 NOTE — Telephone Encounter (Signed)
Called and lvm for patient to call us back. 

## 2022-03-23 NOTE — Telephone Encounter (Signed)
Patient returned call and I relayed message from Dr. Diamantina Providence, and she verbalized understanding.

## 2022-04-04 ENCOUNTER — Telehealth: Payer: Self-pay

## 2022-04-04 DIAGNOSIS — R519 Headache, unspecified: Secondary | ICD-10-CM

## 2022-04-04 DIAGNOSIS — J309 Allergic rhinitis, unspecified: Secondary | ICD-10-CM

## 2022-04-04 MED ORDER — TOPIRAMATE 50 MG PO TABS
50.0000 mg | ORAL_TABLET | Freq: Every day | ORAL | 2 refills | Status: AC
Start: 2022-04-04 — End: ?

## 2022-04-04 MED ORDER — FLUTICASONE PROPIONATE 50 MCG/ACT NA SUSP: 2.0000 | Freq: Every day | NASAL | 1 refills | Status: DC

## 2022-04-04 NOTE — Addendum Note (Signed)
Addended by: Pleas Koch on: 04/04/2022 05:15 PM   Modules accepted: Orders

## 2022-04-04 NOTE — Telephone Encounter (Signed)
Patient does not have active prediabetes and no prior history of type 2 diabetes.  Not really sure why there is a request for glucose monitor with supplies.  Will refill Flonase and topiramate, will send to [place].

## 2022-04-12 ENCOUNTER — Ambulatory Visit: Payer: Medicare HMO | Admitting: Primary Care

## 2022-04-13 ENCOUNTER — Telehealth: Payer: Self-pay | Admitting: Gastroenterology

## 2022-04-13 NOTE — Telephone Encounter (Signed)
All patients medical records were sent to [place] [facility]  [phone] mailed on 04-13-2022

## 2022-04-20 ENCOUNTER — Other Ambulatory Visit: Payer: Self-pay | Admitting: Primary Care

## 2022-04-20 DIAGNOSIS — I10 Essential (primary) hypertension: Secondary | ICD-10-CM

## 2022-04-20 NOTE — Telephone Encounter (Signed)
Patient is due for CPE/follow up in early February 2024, this will be required prior to any further refills.  Please schedule.

## 2022-04-25 ENCOUNTER — Ambulatory Visit: Payer: Medicare HMO | Admitting: Physician Assistant

## 2022-05-10 DIAGNOSIS — Z20822 Contact with and (suspected) exposure to covid-19: Secondary | ICD-10-CM | POA: Diagnosis not present

## 2022-05-10 DIAGNOSIS — R1110 Vomiting, unspecified: Secondary | ICD-10-CM | POA: Diagnosis not present

## 2022-05-11 DIAGNOSIS — R1110 Vomiting, unspecified: Secondary | ICD-10-CM | POA: Diagnosis not present

## 2022-05-11 DIAGNOSIS — Z20822 Contact with and (suspected) exposure to covid-19: Secondary | ICD-10-CM | POA: Diagnosis not present

## 2022-05-12 ENCOUNTER — Ambulatory Visit: Payer: Medicare HMO | Admitting: Physician Assistant

## 2022-05-12 ENCOUNTER — Encounter: Payer: Self-pay | Admitting: Physician Assistant

## 2022-05-12 VITALS — BP 104/69 | HR 71 | Ht 63.0 in | Wt 120.0 lb

## 2022-05-12 DIAGNOSIS — R300 Dysuria: Secondary | ICD-10-CM

## 2022-05-12 DIAGNOSIS — N390 Urinary tract infection, site not specified: Secondary | ICD-10-CM

## 2022-05-12 DIAGNOSIS — R3915 Urgency of urination: Secondary | ICD-10-CM

## 2022-05-12 DIAGNOSIS — R82998 Other abnormal findings in urine: Secondary | ICD-10-CM | POA: Diagnosis not present

## 2022-05-12 DIAGNOSIS — R8271 Bacteriuria: Secondary | ICD-10-CM

## 2022-05-12 DIAGNOSIS — R102 Pelvic and perineal pain: Secondary | ICD-10-CM | POA: Diagnosis not present

## 2022-05-12 DIAGNOSIS — R110 Nausea: Secondary | ICD-10-CM | POA: Diagnosis not present

## 2022-05-12 DIAGNOSIS — R350 Frequency of micturition: Secondary | ICD-10-CM

## 2022-05-12 DIAGNOSIS — R109 Unspecified abdominal pain: Secondary | ICD-10-CM

## 2022-05-12 LAB — URINALYSIS, COMPLETE
Bilirubin, UA: NEGATIVE
Glucose, UA: NEGATIVE
Ketones, UA: NEGATIVE
Nitrite, UA: POSITIVE — AB
Protein,UA: NEGATIVE
Specific Gravity, UA: 1.02 (ref 1.005–1.030)
Urobilinogen, Ur: 1 mg/dL (ref 0.2–1.0)
pH, UA: 6 (ref 5.0–7.5)

## 2022-05-12 LAB — MICROSCOPIC EXAMINATION: WBC, UA: >30 /hpf — AB (ref 0–5)

## 2022-05-12 LAB — BLADDER SCAN AMB NON-IMAGING

## 2022-05-12 MED ORDER — NITROFURANTOIN MONOHYD MACRO 100 MG PO CAPS: 100.0000 mg | ORAL_CAPSULE | Freq: Two times a day (BID) | ORAL | 0 refills | Status: AC

## 2022-05-12 NOTE — Progress Notes (Signed)
05/12/2022 12:08 PM   Valerie [name] Feb 08, 1951 [id number]  CC: Chief Complaint  Patient presents with   Acute Visit    Lower abdominal pain   HPI: Valerie [name] is a 71 y.o. female with PMH chronic nausea and abdominal pain and asymptomatic bacteriuria versus recurrent UTIs on suppressive Keflex who presents today for evaluation of possible UTI.   Today she reports a 1 to 2-week history of dysuria, increased urgency/frequency over baseline, increased nausea over baseline, and pelvic pain.  She also reports some right low back pain and points to her lumbar spine.  She is scheduled to see gastroenterology in January for evaluation of her chronic nausea and abdominal pain.  Notably, she was recently diagnosed with alpha gal allergy.  In-office UA today positive for 2+ blood, nitrites, and 2+ leukocytes; urine microscopy with >30 WBCs/HPF, 11-30 RBCs/HPF, and many bacteria. PVR 39m.  PMH: Past Medical History:  Diagnosis Date   Acid reflux    Adenocarcinoma of the [place]rium/uterus ([place] 12/04/[place]   Anal fissure    Anxiety    Atrophic vaginitis    COVID-19 virus infection 09/28/2020   Difficult intubation    limited neck mobility   Esophagitis    Hernia    Present to proximal to umbillical    History of hiatal hernia    Hyperlipidemia    Hypertension    Internal hemorrhoids    Menorrhagia    Osteopenia    PONV (postoperative nausea and vomiting)    Recurrent UTI    Vertigo    Vitamin D deficiency     Surgical History: Past Surgical History:  Procedure Laterality Date   [place]OUR [place] N/[place] 11/29/2015   Procedure: 24 HOUR [place];  Surgeon: SManus Gunning MD;  Location: [facility];  Service: Gastroenterology;  Laterality: N/A;   ABDOMINAL HYSTERECTOMY  12/04/2000   also bilateral salpingo-oophorectomy   CARDIAC CATHETERIZATION  2006   CARPAL TUNNEL RELEASE  11/20/2011   Procedure: CARPAL TUNNEL RELEASE;  Surgeon: GElaina Hoops MD;  [facility];   Service: Neurosurgery;  Laterality: Left;  LEFT carpal tunnel release   [place]  [place]  COLONOSCOPY WITH PROPOFOL N/A 07/27/2020   Procedure: COLONOSCOPY WITH PROPOFOL;  Surgeon: WLucilla Lame MD;  [facility];  Service: Endoscopy;  Laterality: N/A;   DILATION AND CURETTAGE OF UTERUS     11/15/2000   ESOPHAGEAL MANOMETRY N/A 09/20/2015   Procedure: ESOPHAGEAL MANOMETRY (EM);  Surgeon: SManus Gunning MD;  Location: [facility];  Service: Gastroenterology;  Laterality: N/A;   ESOPHAGOGASTRODUODENOSCOPY (EGD) WITH PROPOFOL N/A 05/17/2021   Procedure: ESOPHAGOGASTRODUODENOSCOPY (EGD) WITH PROPOFOL;  Surgeon: TVirgel Manifold MD;  Location: [facility];  Service: Endoscopy;  Laterality: N/A;   HYSTEROSCOPY  11/15/2000   D & C, resection of endometrial polyps   LUMBAR SPINAL CORD SIMULATOR LEAD REMOVAL N/A 08/01/2021   Procedure: REMOVAL OF SPINAL CORD STIMULATOR;  Surgeon: YMeade Maw MD;  Location: [facility];  Service: Neurosurgery;  Laterality: N/A;   OOPHORECTOMY  12/04/2000   bilateral salpingo-oophorectomy done with TAH   SPINAL CORD STIMULATOR INSERTION Bilateral 04/12/2020   Procedure: INSERTION CERVICAL SPINAL STIMULATOR PULSE GENERATOR;  Surgeon: CDeetta Perla MD;  Location: [facility] ORS;  Service: Neurosurgery;  Laterality: Bilateral;   SPINAL CORD STIMULATOR TRIAL N/A 03/22/2020   Procedure: CERVICAL SPINAL CORD STIMULATOR TRIAL PERCUTANEOUS;  Surgeon: CDeetta Perla MD;  Location: [facility] ORS;  Service: Neurosurgery;  Laterality: N/A;   TUBAL LIGATION      Home Medications:  Allergies as of 05/12/2022       Reactions   Bactrim [sulfamethoxazole-trimethoprim] Other (See Comments)   Mouth ulcers and sores        Medication List        Accurate as of May 12, 2022 12:08 PM. If you have any questions, ask your nurse or doctor.          STOP taking these medications    cephALEXin 250 MG  capsule Commonly known as: Keflex Stopped by: Debroah Loop, PA-C       TAKE these medications    acetaminophen 500 MG tablet Commonly known as: TYLENOL Take 500-1,000 mg by mouth every 6 (six) hours as needed for moderate pain.   acetaminophen 325 MG tablet Commonly known as: TYLENOL Take 325 mg by mouth 2 (two) times daily as needed (pain).   amLODipine 5 MG tablet Commonly known as: NORVASC TAKE 1 TABLET EVERY DAY FOR BLOOD PRESSURE   aspirin EC 81 MG tablet Take 81 mg by mouth daily. Swallow whole.   atorvastatin 10 MG tablet Commonly known as: LIPITOR Take 1 tablet (10 mg total) by mouth daily. for cholesterol.   azelastine 0.1 % nasal spray Commonly known as: ASTELIN Place 1 spray into both nostrils 2 (two) times daily. Use in each nostril as directed   CALCIUM-VITAMIN D PO Take 2 tablets by mouth daily.   cetirizine 10 MG tablet Commonly known as: ZYRTEC Take 1 tablet (10 mg total) by mouth daily as needed for allergies.   DULoxetine 20 MG capsule Commonly known as: Cymbalta Take 2 capsules (40 mg total) by mouth daily. For anxiety/depression/pain   EPINEPHrine 0.3 mg/0.3 mL Soaj injection Commonly known as: EPI-PEN Inject 0.3 mg into the muscle as directed.   fluticasone 50 MCG/ACT nasal spray Commonly known as: FLONASE Place 2 sprays into both nostrils daily.   metoprolol succinate 50 MG 24 hr tablet Commonly known as: TOPROL-XL TAKE 1 TABLET DAILY FOR HEART RATE/PALPITATIONS   mirabegron ER 25 MG Tb24 tablet Commonly known as: Myrbetriq Take 1 tablet (25 mg total) by mouth daily.   nitrofurantoin (macrocrystal-monohydrate) 100 MG capsule Commonly known as: MACROBID Take 1 capsule (100 mg total) by mouth 2 (two) times daily for 5 days. Started by: Debroah Loop, PA-C   Olopatadine HCl 0.2 % Soln Apply 1 drop to eye daily as needed (itchy watery eyes).   omeprazole 40 MG capsule Commonly known as: PRILOSEC TAKE 1 CAPSULE  DAILY. FOR HEARTBURN.   topiramate 50 MG tablet Commonly known as: Topamax Take 1 tablet (50 mg total) by mouth daily. For headache prevention   valsartan 160 MG tablet Commonly known as: DIOVAN Take 160 mg by mouth daily.        Allergies:  Allergies  Allergen Reactions   Bactrim [Sulfamethoxazole-Trimethoprim] Other (See Comments)    Mouth ulcers and sores    Family History: Family History  Problem Relation Age of Onset   Hypertension Father    Prostate cancer Father    Hypertension Sister    Lung cancer Sister    Diabetes Brother    Heart disease Brother    Hypertension Brother    Diabetes Brother    Heart disease Brother    Breast cancer Neg Hx     Social History:   reports that she has never smoked. She has never been exposed to tobacco smoke. She has never  used smokeless tobacco. She reports that she does not drink alcohol and does not use drugs.  Physical Exam: BP 104/69   Pulse 71   Ht '5\' 3"'$  (1.6 m)   Wt 120 lb (54.4 kg)   BMI 21.26 kg/m   Constitutional:  Alert and oriented, no acute distress, nontoxic appearing HEENT: [place], AT Cardiovascular: No clubbing, cyanosis, or edema Respiratory: Normal respiratory effort, no increased work of breathing GU: No CVA tenderness Skin: No rashes, bruises or suspicious lesions Neurologic: Grossly intact, no focal deficits, moving all 4 extremities Psychiatric: Normal mood and affect  Laboratory Data: Results for orders placed or performed in visit on 05/12/22  Microscopic Examination   Urine  Result Value Ref Range   WBC, UA >30 (A) 0 - 5 /hpf   RBC, Urine 11-30 (A) 0 - 2 /hpf   Epithelial Cells (non renal) 0-10 0 - 10 /hpf   Bacteria, UA Many (A) None seen/Few  Urinalysis, Complete  Result Value Ref Range   Specific Gravity, UA 1.020 1.005 - 1.030   pH, UA 6.0 5.0 - 7.5   Color, UA Yellow Yellow   Appearance Ur Cloudy (A) Clear   Leukocytes,UA 2+ (A) Negative   Protein,UA Negative Negative/Trace    Glucose, UA Negative Negative   Ketones, UA Negative Negative   RBC, UA 2+ (A) Negative   Bilirubin, UA Negative Negative   Urobilinogen, Ur 1.0 0.2 - 1.0 mg/dL   Nitrite, UA Positive (A) Negative   Microscopic Examination See below:   BLADDER SCAN AMB NON-IMAGING  Result Value Ref Range   Scan Result 71m    Assessment & Plan:   1. Recurrent UTI 1 to 2 weeks of slightly increased urinary symptoms.  UA is infected appearing which is typical for her baseline.  I do not think the UTI is the source of her right low back pain, but will start her on empiric Macrobid and send for culture for further evaluation.  We discussed stopping Keflex while on a treatment course of antibiotics.  I encouraged her to follow-up with her PCP if her back pain does not improve, as I expect it will not. - Urinalysis, Complete - CULTURE, URINE COMPREHENSIVE - BLADDER SCAN AMB NON-IMAGING - nitrofurantoin, macrocrystal-monohydrate, (MACROBID) 100 MG capsule; Take 1 capsule (100 mg total) by mouth 2 (two) times daily for 5 days.  Dispense: 10 capsule; Refill: 0  Return if symptoms worsen or fail to improve.  SDebroah Loop PA-C  [facility] [address] [place] [phone]

## 2022-05-14 DIAGNOSIS — Z20822 Contact with and (suspected) exposure to covid-19: Secondary | ICD-10-CM | POA: Diagnosis not present

## 2022-05-14 DIAGNOSIS — R1110 Vomiting, unspecified: Secondary | ICD-10-CM | POA: Diagnosis not present

## 2022-05-15 DIAGNOSIS — R1110 Vomiting, unspecified: Secondary | ICD-10-CM | POA: Diagnosis not present

## 2022-05-15 DIAGNOSIS — Z20822 Contact with and (suspected) exposure to covid-19: Secondary | ICD-10-CM | POA: Diagnosis not present

## 2022-05-16 ENCOUNTER — Ambulatory Visit: Payer: Medicare HMO | Admitting: Internal Medicine

## 2022-05-16 ENCOUNTER — Encounter: Payer: Self-pay | Admitting: Internal Medicine

## 2022-05-16 VITALS — BP 100/60 | HR 58 | Temp 98.8°F | Resp 16 | Wt 131.9 lb

## 2022-05-16 DIAGNOSIS — J309 Allergic rhinitis, unspecified: Secondary | ICD-10-CM

## 2022-05-16 DIAGNOSIS — H1013 Acute atopic conjunctivitis, bilateral: Secondary | ICD-10-CM

## 2022-05-16 DIAGNOSIS — Z91018 Allergy to other foods: Secondary | ICD-10-CM | POA: Diagnosis not present

## 2022-05-16 DIAGNOSIS — J3089 Other allergic rhinitis: Secondary | ICD-10-CM | POA: Diagnosis not present

## 2022-05-16 MED ORDER — CETIRIZINE HCL 10 MG PO TABS: 10.0000 mg | ORAL_TABLET | Freq: Every day | ORAL | 5 refills | Status: DC | PRN

## 2022-05-16 MED ORDER — FLUTICASONE PROPIONATE 50 MCG/ACT NA SUSP: 2.0000 | Freq: Every day | NASAL | 1 refills | Status: DC

## 2022-05-16 MED ORDER — AZELASTINE HCL 0.1 % NA SOLN: 1.0000 | Freq: Two times a day (BID) | NASAL | 5 refills | Status: DC

## 2022-05-16 NOTE — Progress Notes (Signed)
FOLLOW UP Date of Service/Encounter:  05/16/22   Subjective:  Valerie [name] (DOB: [birth year]) is a 71 y.o. female who returns to the [facility] on 05/16/2022 for follow up for allergic rhinitis, allergic conjunctivitis, alpha gal allergy.  History obtained from: chart review and patient.  Her last visit was with me on March 14, 2022 for allergic rhinoconjunctivitis and alpha gal allergy.Her alpha gal level at that time was elevated at 15.9; her initial reaction included delayed itching and hives after eating a beef burger.  She was told to avoid all red meat and to carry an EpiPen for severe reactions.  She also underwent skin prick testing which was positive for cat dog and mold and was started on Flonase, azelastine as needed, Zyrtec as needed and olopatadine eyedrops as needed.    Since last visit, she reports doing better.  She has not had much congestion, runny nose, drainage.  She does still have some sneezing.  She does not have itchy watery eyes but does note dryness and irritation.  She is using Flonase 2 SEN daily and azelastine as needed about 3-4 times a week.  She also uses Zyrtec a few days out of the week.  She has not required the olopatadine eyedrops.  She also avoids all red meat products and dairy products.  She did notice hives when she ate ice cream.  No accidental exposures since last visit, she does have an EpiPen and has not had to use it.  No tick bites since last visit.  Past Medical History: Past Medical History:  Diagnosis Date   Acid reflux    Adenocarcinoma of the endometrium/uterus ([place]) 12/04/2000   Anal fissure    Anxiety    Atrophic vaginitis    COVID-19 virus infection 09/28/2020   Difficult intubation    limited neck mobility   Esophagitis    Hernia    Present to proximal to umbillical    History of hiatal hernia    Hyperlipidemia    Hypertension    Internal hemorrhoids    Menorrhagia    Osteopenia    PONV (postoperative  nausea and vomiting)    Recurrent UTI    Vertigo    Vitamin D deficiency     Objective:  BP 100/60   Pulse (!) 58   Temp 98.8 F (37.1 C) (Temporal)   Resp 16   Wt 131 lb 14.4 oz (59.8 kg)   SpO2 97%   BMI 23.37 kg/m  Body mass index is 23.37 kg/m. Physical Exam: GEN: alert, well developed HEENT: clear conjunctiva, TM grey and translucent, nose with mild inferior turbinate hypertrophy, pink nasal mucosa, no rhinorrhea, no cobblestoning HEART: regular rate and rhythm, no murmur LUNGS: clear to auscultation bilaterally, no coughing, unlabored respiration SKIN: no rashes or lesions  Data Reviewed:  Labs: 02/2022 alpha gal IgE 15.9  Assessment/Plan   Mammalian Meat Allergy (galactose-alpha-1, 3-galactose allergy AKA Alpha Gal): - Strictly avoid all mammalian meat (beef, pork, venison, lamb, goat, and bison, etc) - You may continue to eat all poultry and seafood - Always carry your EpiPen with you at all times to be used in case of severe reaction - We can check alpha gal levels yearly.  Last alpha gal IgE 15.9 02/2022.    Allergic Rhinitis Allergic Conjunctivitis - Improved with medications so will plan to continue at this time. If your symptoms are uncontrolled or you are tired of taking daily medications, we can discuss allergy shots at  next visit.  - Positive skin test 02/2022: cat, dog, mold - Avoidance measures discussed. - Use nasal saline rinses before nose sprays such as with Neilmed Sinus Rinse.  Use distilled water.   - Use Flonase 2 sprays each nostril daily. Aim upward and outward. - Use Azelastine 1 sprays each nostril twice daily as needed. Aim upward and outward. - Use Olopatadine 1 eye drops as needed daily for itchy watery eyes.   - Use Zyrtec 10 mg daily as needed for runny nose and itchy watery eyes.     Return in about 6 months (around 11/15/2022). Harlon Flor, MD  [facility]

## 2022-05-16 NOTE — Patient Instructions (Addendum)
Return in about 6 months (around 11/15/2022).   Mammalian Meat Allergy (galactose-alpha-1, 3-galactose allergy AKA Alpha Gal): - Strictly avoid all mammalian meat (beef, pork, venison, lamb, goat, and bison, etc) - You may continue to eat all poultry and seafood - Always carry your EpiPen with you at all times to be used in case of severe reaction - We can check yearly.    Allergic Rhinitis Allergic Conjunctivitis - Improved with medications so will plan to continue at this time. If your symptoms are uncontrolled or you are tired of taking daily medications, we can discuss allergy shots at next visit.  - Positive skin test 02/2022: cat, dog, mold - Avoidance measures discussed. - Use nasal saline rinses before nose sprays such as with Neilmed Sinus Rinse.  Use distilled water.   - Use Flonase 2 sprays each nostril daily. Aim upward and outward. - Use Azelastine 1 sprays each nostril twice daily as needed. Aim upward and outward. - Use Olopatadine 1 eye drops as needed daily for itchy watery eyes.   - Use Zyrtec 10 mg daily as needed for runny nose and itchy watery eyes.

## 2022-05-18 DIAGNOSIS — R1110 Vomiting, unspecified: Secondary | ICD-10-CM | POA: Diagnosis not present

## 2022-05-18 DIAGNOSIS — Z20822 Contact with and (suspected) exposure to covid-19: Secondary | ICD-10-CM | POA: Diagnosis not present

## 2022-05-18 LAB — CULTURE, URINE COMPREHENSIVE

## 2022-05-21 DIAGNOSIS — R1110 Vomiting, unspecified: Secondary | ICD-10-CM | POA: Diagnosis not present

## 2022-05-21 DIAGNOSIS — Z20822 Contact with and (suspected) exposure to covid-19: Secondary | ICD-10-CM | POA: Diagnosis not present

## 2022-05-22 DIAGNOSIS — Z20822 Contact with and (suspected) exposure to covid-19: Secondary | ICD-10-CM | POA: Diagnosis not present

## 2022-05-22 DIAGNOSIS — R1110 Vomiting, unspecified: Secondary | ICD-10-CM | POA: Diagnosis not present

## 2022-05-25 DIAGNOSIS — R1110 Vomiting, unspecified: Secondary | ICD-10-CM | POA: Diagnosis not present

## 2022-05-25 DIAGNOSIS — Z20822 Contact with and (suspected) exposure to covid-19: Secondary | ICD-10-CM | POA: Diagnosis not present

## 2022-05-30 ENCOUNTER — Ambulatory Visit: Payer: Medicare HMO | Admitting: Gastroenterology

## 2022-06-15 ENCOUNTER — Ambulatory Visit (INDEPENDENT_AMBULATORY_CARE_PROVIDER_SITE_OTHER): Payer: Medicare HMO | Admitting: Gastroenterology

## 2022-06-15 ENCOUNTER — Encounter: Payer: Self-pay | Admitting: Gastroenterology

## 2022-06-15 VITALS — BP 133/82 | HR 81 | Temp 98.3°F | Ht 63.0 in | Wt 130.0 lb

## 2022-06-15 DIAGNOSIS — R634 Abnormal weight loss: Secondary | ICD-10-CM | POA: Diagnosis not present

## 2022-06-15 DIAGNOSIS — R110 Nausea: Secondary | ICD-10-CM

## 2022-06-15 DIAGNOSIS — K219 Gastro-esophageal reflux disease without esophagitis: Secondary | ICD-10-CM | POA: Diagnosis not present

## 2022-06-15 DIAGNOSIS — K5900 Constipation, unspecified: Secondary | ICD-10-CM | POA: Diagnosis not present

## 2022-06-15 NOTE — Progress Notes (Signed)
Jonathon Bellows MD, MRCP(U.K) [address]  [place]  [place], [place] [address]  Main: [phone]  Fax: [phone]   Primary Care Physician: Pleas Koch, NP  Primary Gastroenterologist:  Dr. Jonathon Bellows   Chief Complaint  Patient presents with   Nausea   Abdominal Pain    HPI: Valerie [name] is a 72 y.o. female Summary of history :  This patient transferred care to me on 06/29/2021.  She has been previously seen Dr. Bonna Gains last on 05/12/2021 for epigastric pain, treated with PPI has heartburn., constipation on Linzess 145 mcg a day.  Previously seen by another gastroenterologist.  In September 2020 partial small bowel obstruction conservatively treated.  History of cholecystectomy.  Last colonoscopy in March 2022 by Dr. Allen Norris that showed internal hemorrhoids and a repeat was recommended in 10 years.  Prior history of pH study, esophageal manometry in 2017 and  felt that she had functional heartburn and treated with a trial of desipramine.   In 2005 EGD showed esophagitis grade A 05/17/2021: EGD: Inflammation seen in the gastric antrum and esophagus appeared normal.    Interval history 09/20/2021-06/15/2022   She has lost 11 pounds since her last visit in April 2023.  She states that she wakes up with no appetite and has a lot of nausea in the mornings.  Generalized abdominal Discomfort on and off.  Her bowels have not been regular either.  She has been taking 72 mcg of Linzess and in addition has to take another laxative to have a bowel movement.  Has restarted gabapentin.  Takes PPI twice a day in addition to Pepcid. Current Outpatient Medications  Medication Sig Dispense Refill   acetaminophen (TYLENOL) 325 MG tablet Take 325 mg by mouth 2 (two) times daily as needed (pain).     acetaminophen (TYLENOL) 500 MG tablet Take 500-1,000 mg by mouth every 6 (six) hours as needed for moderate pain.     amLODipine (NORVASC) 5 MG tablet TAKE 1 TABLET EVERY DAY FOR BLOOD PRESSURE  90 tablet 0   aspirin EC 81 MG tablet Take 81 mg by mouth daily. Swallow whole.     atorvastatin (LIPITOR) 10 MG tablet Take 1 tablet (10 mg total) by mouth daily. for cholesterol. 90 tablet 1   azelastine (ASTELIN) 0.1 % nasal spray Place 1 spray into both nostrils 2 (two) times daily. Use in each nostril as directed 30 mL 5   CALCIUM-VITAMIN D PO Take 2 tablets by mouth daily.     cetirizine (ZYRTEC) 10 MG tablet Take 1 tablet (10 mg total) by mouth daily as needed for allergies. 30 tablet 5   DULoxetine (CYMBALTA) 20 MG capsule Take 2 capsules (40 mg total) by mouth daily. For anxiety/depression/pain 180 capsule 0   EPINEPHrine 0.3 mg/0.3 mL IJ SOAJ injection Inject 0.3 mg into the muscle as directed. 2 each 1   fluticasone (FLONASE) 50 MCG/ACT nasal spray Place 2 sprays into both nostrils daily. 48 g 1   metoprolol succinate (TOPROL-XL) 50 MG 24 hr tablet TAKE 1 TABLET DAILY FOR HEART RATE/PALPITATIONS 90 tablet 3   mirabegron ER (MYRBETRIQ) 25 MG TB24 tablet Take 1 tablet (25 mg total) by mouth daily. 30 tablet 0   Olopatadine HCl 0.2 % SOLN Apply 1 drop to eye daily as needed (itchy watery eyes). 2.5 mL 2   omeprazole (PRILOSEC) 40 MG capsule TAKE 1 CAPSULE DAILY. FOR HEARTBURN. 90 capsule 2   topiramate (TOPAMAX) 50 MG tablet Take 1  tablet (50 mg total) by mouth daily. For headache prevention 90 tablet 2   valsartan (DIOVAN) 160 MG tablet Take 160 mg by mouth daily.     No current facility-administered medications for this visit.    Allergies as of 06/15/2022 - Review Complete 06/15/2022  Allergen Reaction Noted   Bactrim [sulfamethoxazole-trimethoprim] Other (See Comments) 03/22/2022    ROS:  General: Negative for anorexia, weight loss, fever, chills, fatigue, weakness. ENT: Negative for hoarseness, difficulty swallowing , nasal congestion. CV: Negative for chest pain, angina, palpitations, dyspnea on exertion, peripheral edema.  Respiratory: Negative for dyspnea at rest, dyspnea  on exertion, cough, sputum, wheezing.  GI: See history of present illness. GU:  Negative for dysuria, hematuria, urinary incontinence, urinary frequency, nocturnal urination.  Endo: Negative for unusual weight change.    Physical Examination:   BP 133/82   Pulse 81   Temp 98.3 F (36.8 C) (Oral)   Ht '5\' 3"'$  (1.6 m)   Wt 130 lb (59 kg)   BMI 23.03 kg/m   General: Well-nourished, well-developed in no acute distress.  Eyes: No icterus. Conjunctivae pink. Mouth: Oropharyngeal mucosa moist and pink , no lesions erythema or exudate. Neuro: Alert and oriented x 3.  Grossly intact. Skin: Warm and dry, no jaundice.   Psych: Alert and cooperative, normal mood and affect.   Imaging Studies: No results found.  Assessment and Plan:   Valerie [name] is a 72 y.o. y/o female  here to follow-up for early morning nausea EGD showed no gross abnormality pancreatic lipase was normal prior history suggestive of functional heartburn and constipation for which she was treated with Linzess, at 145 mcg she developed diarrhea and at 72 mcg she is constipated.  Unclear whether she took the medications as suggested.  Since her last visit she has lost about 11 pounds.   Plan 1.  Continue gabapentin, Pepcid 40 mg at night to omeprazole 40 mg twice daily.  Use a wedge pillow at night 2.  See the chest abdomen and pelvis for unintentional weight loss 3.  Restart Linzess at 145 mcg, samples will be provided 4.  Can call her MRI of the brain for chronic nausea at next visit 5.  Offered her referral to [facility] or [facility] appears we have referred her to [facility].  Other considerations to be obtained would be to get pH and manometry testing.     Dr Jonathon Bellows  MD,MRCP [facility]) Follow up in 3 weeks time

## 2022-06-15 NOTE — Addendum Note (Signed)
Addended by: Jacqualin Combes on: 06/15/2022 03:36 PM   Modules accepted: Orders

## 2022-06-15 NOTE — Addendum Note (Signed)
Addended by: Jacqualin Combes on: 06/15/2022 03:39 PM   Modules accepted: Orders

## 2022-06-20 DIAGNOSIS — Z20822 Contact with and (suspected) exposure to covid-19: Secondary | ICD-10-CM | POA: Diagnosis not present

## 2022-06-20 DIAGNOSIS — R1110 Vomiting, unspecified: Secondary | ICD-10-CM | POA: Diagnosis not present

## 2022-06-22 DIAGNOSIS — Z20822 Contact with and (suspected) exposure to covid-19: Secondary | ICD-10-CM | POA: Diagnosis not present

## 2022-06-22 DIAGNOSIS — R1110 Vomiting, unspecified: Secondary | ICD-10-CM | POA: Diagnosis not present

## 2022-06-24 ENCOUNTER — Other Ambulatory Visit: Payer: Self-pay | Admitting: Primary Care

## 2022-06-24 DIAGNOSIS — K219 Gastro-esophageal reflux disease without esophagitis: Secondary | ICD-10-CM

## 2022-06-24 DIAGNOSIS — I10 Essential (primary) hypertension: Secondary | ICD-10-CM

## 2022-06-25 NOTE — Telephone Encounter (Signed)
Hi Kiran, did you change her omeprazole dose to 40 mg BID? Just double checking since I received her refill request.

## 2022-06-26 NOTE — Telephone Encounter (Signed)
Patient is due for CPE/follow up, this will be required prior to any further refills.  Please schedule, thank you!   

## 2022-06-26 NOTE — Telephone Encounter (Signed)
She was still symptomatic and recommended  Pepcid 40 mg at night  and omeprazole 40 mg twice daily to see if she gets better relief.

## 2022-06-27 ENCOUNTER — Ambulatory Visit: Payer: Medicare HMO

## 2022-06-27 NOTE — Telephone Encounter (Signed)
Vm was too full. Sent mychart message

## 2022-07-11 ENCOUNTER — Ambulatory Visit
Admission: RE | Admit: 2022-07-11 | Discharge: 2022-07-11 | Disposition: A | Discharge status: Home / Self Care | Payer: Medicare HMO | Source: Ambulatory Visit | Attending: Gastroenterology | Admitting: Gastroenterology

## 2022-07-11 DIAGNOSIS — M160 Bilateral primary osteoarthritis of hip: Secondary | ICD-10-CM | POA: Diagnosis not present

## 2022-07-11 DIAGNOSIS — R933 Abnormal findings on diagnostic imaging of other parts of digestive tract: Secondary | ICD-10-CM | POA: Insufficient documentation

## 2022-07-11 DIAGNOSIS — J9811 Atelectasis: Secondary | ICD-10-CM | POA: Diagnosis not present

## 2022-07-11 DIAGNOSIS — J984 Other disorders of lung: Secondary | ICD-10-CM | POA: Diagnosis not present

## 2022-07-11 DIAGNOSIS — R110 Nausea: Secondary | ICD-10-CM | POA: Diagnosis not present

## 2022-07-11 DIAGNOSIS — R634 Abnormal weight loss: Secondary | ICD-10-CM | POA: Diagnosis not present

## 2022-07-11 DIAGNOSIS — R918 Other nonspecific abnormal finding of lung field: Secondary | ICD-10-CM | POA: Insufficient documentation

## 2022-07-11 DIAGNOSIS — I700 Atherosclerosis of aorta: Secondary | ICD-10-CM | POA: Insufficient documentation

## 2022-07-11 DIAGNOSIS — K838 Other specified diseases of biliary tract: Secondary | ICD-10-CM | POA: Diagnosis not present

## 2022-07-11 MED ORDER — IOHEXOL 300 MG/ML  SOLN
80.0000 mL | Freq: Once | INTRAMUSCULAR | Status: AC | PRN
  Administered 2022-07-11: 80 mL via INTRAVENOUS

## 2022-07-12 LAB — POCT I-STAT CREATININE: Creatinine, Ser: 0.9 mg/dL (ref 0.44–1.00)

## 2022-07-13 ENCOUNTER — Telehealth: Payer: Self-pay

## 2022-07-13 DIAGNOSIS — K639 Disease of intestine, unspecified: Secondary | ICD-10-CM

## 2022-07-13 NOTE — Telephone Encounter (Signed)
Patient called and left a voicemail to call her back. I then called her back and her cell phone was not able to accept messages at this time because it was full. I see that she is active on MyChart. Therefore, I will message her there.

## 2022-07-13 NOTE — Progress Notes (Signed)
Maritza informed the following   the CT scan showed the following  1.  Thickening of the stomach recommended EGD per the radiologist please schedule if patient agrees  2.  There is some prominence of one of the tubes coming out of the liver and MRCP is suggested please order it if patient agrees  3.  There are some nodules in the upper lobe of the left lung and follow-up with the CT scan in 12 months is recommended I will CC the PCP and this note to follow-up on the same

## 2022-07-14 ENCOUNTER — Encounter: Payer: Self-pay | Admitting: Gastroenterology

## 2022-07-14 ENCOUNTER — Other Ambulatory Visit: Payer: Self-pay

## 2022-07-14 DIAGNOSIS — K639 Disease of intestine, unspecified: Secondary | ICD-10-CM

## 2022-07-14 DIAGNOSIS — R9389 Abnormal findings on diagnostic imaging of other specified body structures: Secondary | ICD-10-CM

## 2022-07-14 NOTE — Telephone Encounter (Signed)
Patient called back and se agreed on doing the EGD and MRCP. Her EGD is scheduled to be done on 07/17/2022. Patient was instructed to hold her Aspirin 2 days prior. She was also instructed to not eat or drink after midnight the night before her procedure. Patient knows that the endoscopy unit will be calling her today with a time. Patient wants me to call her back once her MRCP is scheduled. I told her that I would. I then called patient back to let her know that her MRCP will be on 07/25/2022 at 3 PM and at the [facility] and that she is not to eat or drink after 11 AM that day. Patient understood and had no further questions.

## 2022-07-17 ENCOUNTER — Ambulatory Visit: Payer: Medicare HMO | Admitting: Certified Registered Nurse Anesthetist

## 2022-07-17 ENCOUNTER — Encounter
Admission: RE | Disposition: A | Discharge status: Home / Self Care | Payer: Self-pay | Source: Ambulatory Visit | Attending: Gastroenterology

## 2022-07-17 ENCOUNTER — Encounter: Payer: Self-pay | Admitting: Gastroenterology

## 2022-07-17 ENCOUNTER — Ambulatory Visit
Admission: RE | Admit: 2022-07-17 | Discharge: 2022-07-17 | Disposition: A | Discharge status: Home / Self Care | Payer: Medicare HMO | Source: Ambulatory Visit | Attending: Gastroenterology | Admitting: Gastroenterology

## 2022-07-17 DIAGNOSIS — R948 Abnormal results of function studies of other organs and systems: Secondary | ICD-10-CM | POA: Insufficient documentation

## 2022-07-17 DIAGNOSIS — R933 Abnormal findings on diagnostic imaging of other parts of digestive tract: Secondary | ICD-10-CM

## 2022-07-17 DIAGNOSIS — I10 Essential (primary) hypertension: Secondary | ICD-10-CM | POA: Insufficient documentation

## 2022-07-17 DIAGNOSIS — K2970 Gastritis, unspecified, without bleeding: Secondary | ICD-10-CM | POA: Diagnosis not present

## 2022-07-17 DIAGNOSIS — K639 Disease of intestine, unspecified: Secondary | ICD-10-CM | POA: Diagnosis not present

## 2022-07-17 DIAGNOSIS — K319 Disease of stomach and duodenum, unspecified: Secondary | ICD-10-CM | POA: Diagnosis not present

## 2022-07-17 DIAGNOSIS — E785 Hyperlipidemia, unspecified: Secondary | ICD-10-CM | POA: Diagnosis not present

## 2022-07-17 DIAGNOSIS — K3189 Other diseases of stomach and duodenum: Secondary | ICD-10-CM | POA: Diagnosis not present

## 2022-07-17 DIAGNOSIS — F418 Other specified anxiety disorders: Secondary | ICD-10-CM | POA: Diagnosis not present

## 2022-07-17 HISTORY — PX: ESOPHAGOGASTRODUODENOSCOPY (EGD) WITH PROPOFOL: SHX5813

## 2022-07-17 SURGERY — ESOPHAGOGASTRODUODENOSCOPY (EGD) WITH PROPOFOL
Anesthesia: General

## 2022-07-17 MED ORDER — SODIUM CHLORIDE 0.9 % IV SOLN
INTRAVENOUS | Status: DC
  Administered 2022-07-17: 1000 mL via INTRAVENOUS

## 2022-07-17 MED ORDER — PROPOFOL 500 MG/50ML IV EMUL
INTRAVENOUS | Status: DC | PRN
  Administered 2022-07-17: 150 ug/kg/min via INTRAVENOUS

## 2022-07-17 MED ORDER — PROPOFOL 10 MG/ML IV BOLUS
INTRAVENOUS | Status: DC | PRN
  Administered 2022-07-17: 70 mg via INTRAVENOUS

## 2022-07-17 MED ORDER — GLYCOPYRROLATE 0.2 MG/ML IJ SOLN
INTRAMUSCULAR | Status: DC | PRN
  Administered 2022-07-17: .2 mg via INTRAVENOUS

## 2022-07-17 MED ORDER — LIDOCAINE HCL (CARDIAC) PF 100 MG/5ML IV SOSY
PREFILLED_SYRINGE | INTRAVENOUS | Status: DC | PRN
  Administered 2022-07-17: 100 mg via INTRAVENOUS

## 2022-07-17 NOTE — H&P (Signed)
Valerie Bellows, MD [address], [place], [place], [place], [address] [address], [place], [place], [place], [address] Phone: [phone]  Fax: [phone]  Primary Care Physician:  Pleas Koch, NP   Pre-Procedure History & Physical: HPI:  Valerie [name] is a 72 y.o. female is here for an endoscopy    Past Medical History:  Diagnosis Date   Acid reflux    Adenocarcinoma of the endometrium/uterus ([place]) 12/04/2000   Anal fissure    Anxiety    Atrophic vaginitis    COVID-19 virus infection 09/28/2020   Difficult intubation    limited neck mobility   Esophagitis    Hernia    Present to proximal to umbillical    History of hiatal hernia    Hyperlipidemia    Hypertension    Internal hemorrhoids    Menorrhagia    Osteopenia    PONV (postoperative nausea and vomiting)    Recurrent UTI    Vertigo    Vitamin D deficiency     Past Surgical History:  Procedure Laterality Date   17 HOUR [place] STUDY N/A 11/29/2015   Procedure: 24 HOUR [place] STUDY;  Surgeon: Manus Gunning, MD;  Location: [facility] END[facility];  Service: Gastroenterology;  Laterality: N/A;   ABDOMINAL HYSTERECTOMY  12/04/2000   also bilateral salpingo-oophorectomy   CARDIAC CATHETERIZATION  2006   CARPAL TUNNEL RELEASE  11/20/2011   Procedure: CARPAL TUNNEL RELEASE;  Surgeon: Elaina Hoops, MD;  Location: [facility]O ORS;  Service: Neurosurgery;  Laterality: Left;  LEFT carpal tunnel release   CERVICAL SPINE SURGERY  2013   [place]   COLONOSCOPY WITH PROPOFOL N/A 07/27/2020   Procedure: COLONOSCOPY WITH PROPOFOL;  Surgeon: Lucilla Lame, MD;  Location: [facility] ENDOSCOPY;  Service: Endoscopy;  Laterality: N/A;   DILATION AND CURETTAGE OF UTERUS     11/15/2000   ESOPHAGEAL MANOMETRY N/A 09/20/2015   Procedure: ESOPHAGEAL MANOMETRY (EM);  Surgeon: Manus Gunning, MD;  Location: [facility] ENDOSCOPY;  Service: Gastroenterology;  Laterality: N/A;   ESOPHAGOGASTRODUODENOSCOPY (EGD) WITH PROPOFOL N/A  05/17/2021   Procedure: ESOPHAGOGASTRODUODENOSCOPY (EGD) WITH PROPOFOL;  Surgeon: Virgel Manifold, MD;  Location: [facility] ENDOSCOPY;  Service: Endoscopy;  Laterality: N/A;   HYSTEROSCOPY  11/15/2000   D & C, resection of endometrial polyps   LUMBAR SPINAL CORD SIMULATOR LEAD REMOVAL N/A 08/01/2021   Procedure: REMOVAL OF SPINAL CORD STIMULATOR;  Surgeon: Meade Maw, MD;  Location: [facility] ORS;  Service: Neurosurgery;  Laterality: N/A;   OOPHORECTOMY  12/04/2000   bilateral salpingo-oophorectomy done with TAH   SPINAL CORD STIMULATOR INSERTION Bilateral 04/12/2020   Procedure: INSERTION CERVICAL SPINAL STIMULATOR PULSE GENERATOR;  Surgeon: [name], MD;  Location: [facility] ORS;  Service: Neurosurgery;  Laterality: Bilateral;   SPINAL CORD STIMULATOR TRIAL N/A 03/22/2020   Procedure: CERVICAL SPINAL CORD STIMULATOR TRIAL PERCUTANEOUS;  Surgeon: [name], MD;  Location: [facility] ORS;  Service: Neurosurgery;  Laterality: N/A;   TUBAL LIGATION      Prior to Admission medications   Medication Sig Start Date End Date Taking? Authorizing Provider  amLODipine (NORVASC) 5 MG tablet TAKE 1 TABLET EVERY DAY FOR BLOOD PRESSURE 04/20/22  Yes Pleas Koch, NP  aspirin EC 81 MG tablet Take 81 mg by mouth daily. Swallow whole.   Yes [provider]  atorvastatin (LIPITOR) 10 MG tablet Take 1 tablet (10 mg total) by mouth daily. for cholesterol. 10/09/21 10/09/22 Yes Pleas Koch, NP  azelastine (ASTELIN)  0.1 % nasal spray Place 1 spray into both nostrils 2 (two) times daily. Use in each nostril as directed 05/16/22  Yes Larose Kells, MD  CALCIUM-VITAMIN D PO Take 2 tablets by mouth daily.   Yes [provider]  cetirizine (ZYRTEC) 10 MG tablet Take 1 tablet (10 mg total) by mouth daily as needed for allergies. 05/16/22  Yes Larose Kells, MD  DULoxetine (CYMBALTA) 20 MG capsule Take 2 capsules (40 mg total) by mouth daily. For anxiety/depression/pain 06/27/21  Yes Pleas Koch, NP  fluticasone [facility]) 50 MCG/ACT nasal spray Place 2 sprays into both nostrils daily. 05/16/22  Yes Larose Kells, MD  metoprolol succinate (TOPROL-XL) 50 MG 24 hr tablet TAKE 1 TABLET DAILY FOR HEART RATE/PALPITATIONS 06/26/22  Yes Pleas Koch, NP  mirabegron ER (MYRBETRIQ) 25 MG TB24 tablet Take 1 tablet (25 mg total) by mouth daily. 03/16/22  Yes Billey Co, MD  Olopatadine HCl 0.2 % SOLN Apply 1 drop to eye daily as needed (itchy watery eyes). 03/14/22  Yes Larose Kells, MD  omeprazole (PRILOSEC) 40 MG capsule Take 1 capsule (40 mg total) by mouth 2 (two) times daily. For heartburn 06/26/22  Yes Pleas Koch, NP  topiramate (TOPAMAX) 50 MG tablet Take 1 tablet (50 mg total) by mouth daily. For headache prevention 04/04/22  Yes Pleas Koch, NP  valsartan (DIOVAN) 160 MG tablet Take 160 mg by mouth daily.   Yes [provider]  acetaminophen (TYLENOL) 325 MG tablet Take 325 mg by mouth 2 (two) times daily as needed (pain).    [provider]  acetaminophen (TYLENOL) 500 MG tablet Take 500-1,000 mg by mouth every 6 (six) hours as needed for moderate pain.    [provider]  EPINEPHrine 0.3 mg/0.3 mL IJ SOAJ injection Inject 0.3 mg into the muscle as directed. 03/14/22   Larose Kells, MD    Allergies as of 07/14/2022 - Review Complete 07/14/2022  Allergen Reaction Noted   Bactrim [sulfamethoxazole-trimethoprim] Other (See Comments) 03/22/2022    Family History  Problem Relation Age of Onset   Hypertension Father    Prostate cancer Father    Hypertension Sister    Lung cancer Sister    Diabetes Brother    Heart disease Brother    Hypertension Brother    Diabetes Brother    Heart disease Brother    Breast cancer Neg Hx     Social History   Socioeconomic History   Marital status: Married    Spouse name: Not on file   Number of children: Not on file   Years of education: Not on file   Highest education level:  Not on file  Occupational History   Not on file  Tobacco Use   Smoking status: Never    Passive exposure: Never   Smokeless tobacco: Never  Vaping Use   Vaping Use: Never used  Substance and Sexual Activity   Alcohol use: No   Drug use: Never   Sexual activity: Yes    Birth control/protection: Post-menopausal, Surgical  Other Topics Concern   Not on file  Social History Narrative   Married   Lives in [place]   Has 2 children, 4 grandchildren   Enjoys walking, shopping.   Social Determinants of Health   Financial Resource Strain: Low Risk  (06/21/2021)   Overall Financial Resource Strain (CARDIA)    Difficulty of Paying Living Expenses: Not hard at all  Food Insecurity: No  Food Insecurity (06/21/2021)   Hunger Vital Sign    Worried About Running Out of Food in the Last Year: Never true    Ran Out of Food in the Last Year: Never true  Transportation Needs: No Transportation Needs (06/21/2021)   PRAPARE - Hydrologist (Medical): No    Lack of Transportation (Non-Medical): No  Physical Activity: Sufficiently Active (06/21/2021)   Exercise Vital Sign    Days of Exercise per Week: 7 days    Minutes of Exercise per Session: 30 min  Stress: No Stress Concern Present (06/21/2021)   [place]    Feeling of Stress : Not at all  Social Connections: Moderately Integrated (06/21/2021)   Social Connection and Isolation Panel [NHANES]    Frequency of Communication with Friends and Family: More than three times a week    Frequency of Social Gatherings with Friends and Family: Three times a week    Attends Religious Services: More than 4 times per year    Active Member of Clubs or Organizations: No    Attends Archivist Meetings: Never    Marital Status: Married  Human resources officer Violence: Not At Risk (06/21/2021)   Humiliation, Afraid, Rape, and Kick questionnaire    Fear of Current  or Ex-Partner: No    Emotionally Abused: No    Physically Abused: No    Sexually Abused: No    Review of Systems: See HPI, otherwise negative ROS  Physical Exam: BP 132/75   Pulse 70   Temp (!) 96.9 F (36.1 C) (Temporal)   Resp 16   Ht 5' 4"$  (1.626 m)   Wt 57.7 kg   SpO2 100%   BMI 21.84 kg/m  General:   Alert,  pleasant and cooperative in NAD Head:  Normocephalic and atraumatic. Neck:  Supple; no masses or thyromegaly. Lungs:  Clear throughout to auscultation, normal respiratory effort.    Heart:  +S1, +S2, Regular rate and rhythm, No edema. Abdomen:  Soft, nontender and nondistended. Normal bowel sounds, without guarding, and without rebound.   Neurologic:  Alert and  oriented x4;  grossly normal neurologically.  Impression/Plan: Valerie [name] is here for an endoscopy  to be performed for  evaluation of abnormal ct scan of the stomach     Risks, benefits, limitations, and alternatives regarding endoscopy have been reviewed with the patient.  Questions have been answered.  All parties agreeable.   Valerie Bellows, MD  07/17/2022, 10:50 AM

## 2022-07-17 NOTE — Anesthesia Postprocedure Evaluation (Signed)
Anesthesia Post Note  Patient: Valerie [name]  Procedure(s) Performed: ESOPHAGOGASTRODUODENOSCOPY (EGD) WITH PROPOFOL  Patient location during evaluation: PACU Anesthesia Type: General Level of consciousness: awake and alert, oriented and patient cooperative Pain management: pain level controlled Vital Signs Assessment: post-procedure vital signs reviewed and stable Respiratory status: spontaneous breathing, nonlabored ventilation and respiratory function stable Cardiovascular status: blood pressure returned to baseline and stable Postop Assessment: adequate PO intake Anesthetic complications: no   No notable events documented.   Last Vitals:  Vitals:   07/17/22 1105 07/17/22 1125  BP: 97/63   Pulse: 91 91  Resp: (!) 23   Temp:    SpO2: 97% 100%    Last Pain:  Vitals:   07/17/22 1125  TempSrc:   PainSc: 0-No pain                 Darrin Nipper

## 2022-07-17 NOTE — Anesthesia Preprocedure Evaluation (Addendum)
Anesthesia Evaluation  Patient identified by MRN, date of birth, ID band Patient awake    Reviewed: Allergy & Precautions, NPO status , Patient's Chart, lab work & pertinent test results  History of Anesthesia Complications (+) PONV, DIFFICULT AIRWAY and history of anesthetic complications (intubation 08/01/21: easy mask ventilation, grade I view, 6.5 ETT placed first attempt with McGraph 3)  Airway Mallampati: IV   Neck ROM: Full    Dental  (+) Upper Dentures, Lower Dentures   Pulmonary neg pulmonary ROS   Pulmonary exam normal breath sounds clear to auscultation       Cardiovascular hypertension, Normal cardiovascular exam Rhythm:Regular Rate:Normal  ECG 01/05/22:  Sinus rhythm Borderline low voltage, extremity leads Abnormal R-wave progression, early transition   Neuro/Psych  PSYCHIATRIC DISORDERS Anxiety Depression    negative neurological ROS     GI/Hepatic hiatal hernia,GERD  ,,  Endo/Other  negative endocrine ROS    Renal/GU negative Renal ROS     Musculoskeletal  (+) Arthritis ,    Abdominal   Peds  Hematology negative hematology ROS (+)   Anesthesia Other Findings   Reproductive/Obstetrics Endometrial cancer                             Anesthesia Physical Anesthesia Plan  ASA: 2  Anesthesia Plan: General   Post-op Pain Management:    Induction: Intravenous  PONV Risk Score and Plan: 4 or greater and Propofol infusion, TIVA and Treatment may vary due to age or medical condition  Airway Management Planned: Natural Airway  Additional Equipment:   Intra-op Plan:   Post-operative Plan:   Informed Consent: I have reviewed the patients History and Physical, chart, labs and discussed the procedure including the risks, benefits and alternatives for the proposed anesthesia with the patient or authorized representative who has indicated his/her understanding and acceptance.        Plan Discussed with: CRNA  Anesthesia Plan Comments: (LMA/GETA backup discussed.  Patient consented for risks of anesthesia including but not limited to:  - adverse reactions to medications - damage to eyes, teeth, lips or other oral mucosa - nerve damage due to positioning  - sore throat or hoarseness - damage to heart, brain, nerves, lungs, other parts of body or loss of life  Informed patient about role of CRNA in peri- and intra-operative care.  Patient voiced understanding.)        Anesthesia Quick Evaluation

## 2022-07-17 NOTE — Anesthesia Procedure Notes (Signed)
Date/Time: 07/17/2022 10:59 AM  Performed by: Lily Peer, Becci Batty, CRNAPre-anesthesia Checklist: Patient identified, Emergency Drugs available, Suction available, Timeout performed and Patient being monitored Patient Re-evaluated:Patient Re-evaluated prior to induction Oxygen Delivery Method: Simple face mask Induction Type: IV induction

## 2022-07-17 NOTE — Op Note (Signed)
[facility] Gastroenterology Patient Name: Valerie [name] Procedure Date: 07/17/2022 10:49 AM MRN: [id number] Account #: [id number] Date of Birth: 07-29-1950 Admit Type: Outpatient Age: 72 Room: [facility] ENDO ROOM 1 Gender: Female Note Status: Finalized Instrument Name: Upper Endoscope [id number] Procedure:             Upper GI endoscopy Indications:           Abnormal CT of the GI tract Providers:             Jonathon Bellows MD, MD Referring MD:          Pleas Koch (Referring MD) Medicines:             Monitored Anesthesia Care Complications:         No immediate complications. Procedure:             Pre-Anesthesia Assessment:                        - Prior to the procedure, a History and Physical was                         performed, and patient medications, allergies and                         sensitivities were reviewed. The patient's tolerance                         of previous anesthesia was reviewed.                        - The risks and benefits of the procedure and the                         sedation options and risks were discussed with the                         patient. All questions were answered and informed                         consent was obtained.                        - ASA Grade Assessment: II - A patient with mild                         systemic disease.                        After obtaining informed consent, the endoscope was                         passed under direct vision. Throughout the procedure,                         the patient's blood pressure, pulse, and oxygen                         saturations were monitored continuously. The Endoscope  was introduced through the mouth, and advanced to the                         third part of duodenum. The upper GI endoscopy was                         accomplished with ease. The patient tolerated the                         procedure well. Findings:      The  esophagus was normal.      The examined duodenum was normal.      The entire examined stomach was normal. Biopsies were taken with a cold       forceps for histology.      The cardia and gastric fundus were normal on retroflexion. Impression:            - Normal esophagus.                        - Normal examined duodenum.                        - Normal stomach. Biopsied. Recommendation:        - Await pathology results.                        - Discharge patient to home (with escort).                        - Resume previous diet.                        - Continue present medications.                        - Return to my office as previously scheduled. Procedure Code(s):     --- Professional ---                        [phone], Esophagogastroduodenoscopy, flexible,                         transoral; with biopsy, single or multiple Diagnosis Code(s):     --- Professional ---                        R93.3, Abnormal findings on diagnostic imaging of                         other parts of digestive tract CPT copyright 2022 American Medical Association. All rights reserved. The codes documented in this report are preliminary and upon coder review may  be revised to meet current compliance requirements. Jonathon Bellows, MD Jonathon Bellows MD, MD 07/17/2022 11:05:10 AM This report has been signed electronically. Number of Addenda: 0 Note Initiated On: 07/17/2022 10:49 AM Estimated Blood Loss:  Estimated blood loss: none.      [facility]

## 2022-07-17 NOTE — Transfer of Care (Signed)
Immediate Anesthesia Transfer of Care Note  Patient: LATORIE VECELLIO  Procedure(s) Performed: ESOPHAGOGASTRODUODENOSCOPY (EGD) WITH PROPOFOL  Patient Location: Endoscopy Unit  Anesthesia Type:General  Level of Consciousness: drowsy  Airway & Oxygen Therapy: Patient Spontanous Breathing  Post-op Assessment: Report given to RN and Post -op Vital signs reviewed and stable  Post vital signs: Reviewed and stable  Last Vitals:  Vitals Value Taken Time  BP 97/63 07/17/22 1105  Temp    Pulse 91 07/17/22 1106  Resp 15 07/17/22 1106  SpO2 99 % 07/17/22 1106  Vitals shown include unvalidated device data.  Last Pain:  Vitals:   07/17/22 1105  TempSrc: Temporal  PainSc: Asleep         Complications: No notable events documented.

## 2022-07-18 ENCOUNTER — Encounter: Payer: Self-pay | Admitting: Gastroenterology

## 2022-07-18 LAB — SURGICAL PATHOLOGY

## 2022-07-25 ENCOUNTER — Ambulatory Visit
Admission: RE | Admit: 2022-07-25 | Discharge: 2022-07-25 | Disposition: A | Discharge status: Home / Self Care | Payer: Medicare HMO | Source: Ambulatory Visit | Attending: Gastroenterology | Admitting: Gastroenterology

## 2022-07-25 ENCOUNTER — Other Ambulatory Visit: Payer: Self-pay | Admitting: Gastroenterology

## 2022-07-25 DIAGNOSIS — K838 Other specified diseases of biliary tract: Secondary | ICD-10-CM | POA: Diagnosis not present

## 2022-07-25 DIAGNOSIS — R9389 Abnormal findings on diagnostic imaging of other specified body structures: Secondary | ICD-10-CM

## 2022-07-25 DIAGNOSIS — R935 Abnormal findings on diagnostic imaging of other abdominal regions, including retroperitoneum: Secondary | ICD-10-CM | POA: Diagnosis not present

## 2022-07-25 MED ORDER — GADOBUTROL 1 MMOL/ML IV SOLN
6.0000 mL | Freq: Once | INTRAVENOUS | Status: AC | PRN
  Administered 2022-07-25: 6 mL via INTRAVENOUS

## 2022-07-27 NOTE — Progress Notes (Signed)
Inform the MRCP suggests there may be stones in the common bile duct- not sure based on mri- would recommend refer for EUS to determine if there are stones and if present will need ERCP as well. Please refer to Dr Rush Landmark

## 2022-07-28 ENCOUNTER — Telehealth: Payer: Self-pay | Admitting: Gastroenterology

## 2022-07-28 DIAGNOSIS — R933 Abnormal findings on diagnostic imaging of other parts of digestive tract: Secondary | ICD-10-CM

## 2022-07-28 NOTE — Telephone Encounter (Signed)
Pt left message needs call back did not state what she needs.

## 2022-07-28 NOTE — Telephone Encounter (Signed)
Jonathon Bellows, MD  Wayna Chalet, CMA Inform the MRCP suggests there may be stones in the common bile duct- not sure based on mri- would recommend refer for EUS to determine if there are stones and if present will need ERCP as well. Please refer to Dr Rush Landmark.

## 2022-07-28 NOTE — Addendum Note (Signed)
Addended by: Wayna Chalet on: 07/28/2022 11:49 AM   Modules accepted: Orders

## 2022-07-28 NOTE — Telephone Encounter (Signed)
Valerie [name] above message

## 2022-07-28 NOTE — Telephone Encounter (Signed)
Called patient back to let her know the below information and she agreed on [place] sending the referral in for her. I told her that I would be sending the referral and that Dr. Rush Landmark will be the one to perform her EUS to determine if there are stones present and if there are, then she would need an ERCP as well.  A message was sent to his nurse Patty to give her the heads up.

## 2022-08-02 ENCOUNTER — Telehealth: Payer: Self-pay | Admitting: Gastroenterology

## 2022-08-02 NOTE — Addendum Note (Signed)
Addended by: Wayna Chalet on: 08/02/2022 03:56 PM   Modules accepted: Orders

## 2022-08-02 NOTE — Telephone Encounter (Signed)
Hi Dr. Rush Landmark,  We received a referral from [place] for patient to be evaluated for EUS possible ERCP.   Please review and advise.  Thanks

## 2022-08-03 NOTE — Telephone Encounter (Signed)
I have reviewed the patient's MRI/MRCP. I recommend that the patient undergo liver biochemical tests as able to see where things stand currently (unless they have already been done recently elsewhere I cannot see them on epic or Care Everywhere). Okay to move forward with this planned as a EUS/ERCP for possible stones and extraction if they are found on EUS.  Patty, Please move forward with scheduling this patient's procedure.  KA, I am scheduling quite a few weeks into the future, but if she is stable and you check her liver tests and there is not significant abnormality, then I think it should be okay until we can get her in for our procedure since we need to do an EUS first with the nonabsolute MRCP findings.  However, if her liver tests were to show a significant bilirubin elevation or significant abnormal LFTs, then she may be able to just undergo ERCP.  Please keep our team up-to-date with what those results of liver test show. Thanks. GM

## 2022-08-04 ENCOUNTER — Other Ambulatory Visit: Payer: Self-pay

## 2022-08-04 DIAGNOSIS — R933 Abnormal findings on diagnostic imaging of other parts of digestive tract: Secondary | ICD-10-CM

## 2022-08-04 DIAGNOSIS — K831 Obstruction of bile duct: Secondary | ICD-10-CM

## 2022-08-04 NOTE — Telephone Encounter (Signed)
EUS scheduled, pt instructed and medications reviewed.  Patient instructions mailed to home.  Patient to call with any questions or concerns.  

## 2022-08-04 NOTE — Telephone Encounter (Signed)
EUS ERCP has been scheduled for 09/14/22 at 1115 am at [facility] with GM   Left message on machine to call back

## 2022-08-08 ENCOUNTER — Other Ambulatory Visit: Payer: Self-pay

## 2022-08-08 DIAGNOSIS — R933 Abnormal findings on diagnostic imaging of other parts of digestive tract: Secondary | ICD-10-CM

## 2022-08-09 DIAGNOSIS — R933 Abnormal findings on diagnostic imaging of other parts of digestive tract: Secondary | ICD-10-CM | POA: Diagnosis not present

## 2022-08-09 NOTE — Telephone Encounter (Signed)
Patient finally called me back and stated that she would be coming in today to have her labs drawn. I told her that the phlebotomist was here all day. She then stated that she  would come in the afternoon.

## 2022-08-10 ENCOUNTER — Telehealth: Payer: Self-pay | Admitting: Gastroenterology

## 2022-08-10 LAB — HEPATIC FUNCTION PANEL
ALT: 14 IU/L (ref 0–32)
AST: 21 IU/L (ref 0–40)
Albumin: 4.3 g/dL (ref 3.9–4.9)
Alkaline Phosphatase: 71 IU/L (ref 44–121)
Bilirubin Total: 0.6 mg/dL (ref 0.0–1.2)
Bilirubin, Direct: 0.15 mg/dL (ref 0.00–0.40)
Total Protein: 6.9 g/dL (ref 6.0–8.5)

## 2022-08-10 LAB — GAMMA GT: GGT: 14 IU/L (ref 0–60)

## 2022-08-10 NOTE — Telephone Encounter (Signed)
Dr. Marius Ditch, patient's lab results are back and they are normal. Dr. Vicente Males wanted me to let you know that once they were back for you to review. Thank you.

## 2022-08-10 NOTE — Telephone Encounter (Signed)
Patient calling requesting lab results.

## 2022-08-10 NOTE — Telephone Encounter (Signed)
RRV, Thanks for the update on the LFTs.  We will plan to have her scheduled as an EUS and possible ERCP if stones are found to make it a single event.  But if no stones are found and no ERCP will be performed.  Thanks. GM

## 2022-08-11 ENCOUNTER — Telehealth: Payer: Self-pay | Admitting: Gastroenterology

## 2022-08-11 NOTE — Telephone Encounter (Signed)
Informed patient that her LFTs were normal and patient verbalized understanding

## 2022-08-11 NOTE — Telephone Encounter (Signed)
Pt call to get results of her last labs 03/13//2024 please return call.

## 2022-08-11 NOTE — Telephone Encounter (Signed)
Patient called to get results of her blood work and patient verbalized understanding of results

## 2022-08-17 ENCOUNTER — Telehealth: Payer: Self-pay | Admitting: Primary Care

## 2022-08-17 NOTE — Telephone Encounter (Signed)
Contacted Valerie [name] to schedule their annual wellness visit. Appointment made for 09/12/2022.  [place] Direct Dial: [phone]

## 2022-08-30 ENCOUNTER — Other Ambulatory Visit: Payer: Self-pay | Admitting: Primary Care

## 2022-08-30 DIAGNOSIS — I10 Essential (primary) hypertension: Secondary | ICD-10-CM

## 2022-08-30 DIAGNOSIS — J309 Allergic rhinitis, unspecified: Secondary | ICD-10-CM

## 2022-08-30 NOTE — Telephone Encounter (Signed)
LVM for patient to cb and sch.  

## 2022-08-30 NOTE — Telephone Encounter (Signed)
Patient is due for CPE/follow up, this will be required prior to any further refills.  Please schedule, thank you!   

## 2022-09-03 ENCOUNTER — Other Ambulatory Visit: Payer: Self-pay | Admitting: Primary Care

## 2022-09-03 DIAGNOSIS — I10 Essential (primary) hypertension: Secondary | ICD-10-CM

## 2022-09-03 DIAGNOSIS — K219 Gastro-esophageal reflux disease without esophagitis: Secondary | ICD-10-CM

## 2022-09-07 ENCOUNTER — Encounter (HOSPITAL_COMMUNITY): Payer: Self-pay | Admitting: Gastroenterology

## 2022-09-07 NOTE — Progress Notes (Signed)
Attempted to obtain medical history via telephone, unable to reach at this time. HIPAA compliant voicemail message left requesting return call to pre surgical testing department. 

## 2022-09-12 ENCOUNTER — Ambulatory Visit (INDEPENDENT_AMBULATORY_CARE_PROVIDER_SITE_OTHER): Payer: Medicare HMO

## 2022-09-12 VITALS — Ht 64.0 in | Wt 124.0 lb

## 2022-09-12 DIAGNOSIS — Z0000 Encounter for general adult medical examination without abnormal findings: Secondary | ICD-10-CM | POA: Diagnosis not present

## 2022-09-12 NOTE — Patient Instructions (Signed)
Valerie [name] , Thank you for taking time to come for your Medicare Wellness Visit. I appreciate your ongoing commitment to your health goals. Please review the following plan we discussed and let me know if I can assist you in the future.   These are the goals we discussed:  Goals      Patient advised to follow up on AWV and Vaccines     Patient Stated     10/21/2019, I will maintain and continue medications as prescribed.      Patient Stated     Would like to exercise more and drink more water.     Track and Manage My Blood Pressure-Hypertension     Timeframe:  Long-Range Goal Priority:  High Start Date:         04/26/21                    Expected End Date:   04/26/22                    Follow Up Date Feb 2023   - check blood pressure daily - choose a place to take my blood pressure (home, clinic or office, retail store) - write blood pressure results in a log or diary    Why is this important?   You won't feel high blood pressure, but it can still hurt your blood vessels.  High blood pressure can cause heart or kidney problems. It can also cause a stroke.  Making lifestyle changes like losing a little weight or eating less salt will help.  Checking your blood pressure at home and at different times of the day can help to control blood pressure.  If the doctor prescribes medicine remember to take it the way the doctor ordered.  Call the office if you cannot afford the medicine or if there are questions about it.     Notes:         This is a list of the screening recommended for you and due dates:  Health Maintenance  Topic Date Due   Flu Shot  12/28/2022   Medicare Annual Wellness Visit  09/12/2023   Mammogram  01/12/2024   DTaP/Tdap/Td vaccine (4 - Td or Tdap) 09/21/2025   Colon Cancer Screening  07/28/2030   Pneumonia Vaccine  Completed   Hepatitis C Screening: USPSTF Recommendation to screen - Ages 92-79 yo.  Completed   Zoster (Shingles) Vaccine  Completed   HPV  Vaccine  Aged Out   COVID-19 Vaccine  Discontinued    Advanced directives: none  Conditions/risks identified: Aim for 30 minutes of exercise or brisk walking, 6-8 glasses of water, and 5 servings of fruits and vegetables each day.   Next appointment: Follow up in one year for your annual wellness visit 09/13/23 @ 8:45 televisit   Preventive Care 65 Years and Older, Female Preventive care refers to lifestyle choices and visits with your health care provider that can promote health and wellness. What does preventive care include? A yearly physical exam. This is also called an annual well check. Dental exams once or twice a year. Routine eye exams. Ask your health care provider how often you should have your eyes checked. Personal lifestyle choices, including: Daily care of your teeth and gums. Regular physical activity. Eating a healthy diet. Avoiding tobacco and drug use. Limiting alcohol use. Practicing safe sex. Taking low-dose aspirin every day. Taking vitamin and mineral supplements as recommended by your health care provider. What  happens during an annual well check? The services and screenings done by your health care provider during your annual well check will depend on your age, overall health, lifestyle risk factors, and family history of disease. Counseling  Your health care provider may ask you questions about your: Alcohol use. Tobacco use. Drug use. Emotional well-being. Home and relationship well-being. Sexual activity. Eating habits. History of falls. Memory and ability to understand (cognition). Work and work Astronomer. Reproductive health. Screening  You may have the following tests or measurements: Height, weight, and BMI. Blood pressure. Lipid and cholesterol levels. These may be checked every 5 years, or more frequently if you are over 71 years old. Skin check. Lung cancer screening. You may have this screening every year starting at age 93 if you  have a 30-pack-year history of smoking and currently smoke or have quit within the past 15 years. Fecal occult blood test (FOBT) of the stool. You may have this test every year starting at age 61. Flexible sigmoidoscopy or colonoscopy. You may have a sigmoidoscopy every 5 years or a colonoscopy every 10 years starting at age 20. Hepatitis C blood test. Hepatitis B blood test. Sexually transmitted disease (STD) testing. Diabetes screening. This is done by checking your blood sugar (glucose) after you have not eaten for a while (fasting). You may have this done every 1-3 years. Bone density scan. This is done to screen for osteoporosis. You may have this done starting at age 32. Mammogram. This may be done every 1-2 years. Talk to your health care provider about how often you should have regular mammograms. Talk with your health care provider about your test results, treatment options, and if necessary, the need for more tests. Vaccines  Your health care provider may recommend certain vaccines, such as: Influenza vaccine. This is recommended every year. Tetanus, diphtheria, and acellular pertussis (Tdap, Td) vaccine. You may need a Td booster every 10 years. Zoster vaccine. You may need this after age 7. Pneumococcal 13-valent conjugate (PCV13) vaccine. One dose is recommended after age 44. Pneumococcal polysaccharide (PPSV23) vaccine. One dose is recommended after age 31. Talk to your health care provider about which screenings and vaccines you need and how often you need them. This information is not intended to replace advice given to you by your health care provider. Make sure you discuss any questions you have with your health care provider. Document Released: 06/11/2015 Document Revised: 02/02/2016 Document Reviewed: 03/16/2015 Elsevier Interactive Patient Education  2017 [employer].  Fall Prevention in the Home Falls can cause injuries. They can happen to people of all ages. There are  many things you can do to make your home safe and to help prevent falls. What can I do on the outside of my home? Regularly fix the edges of walkways and driveways and fix any cracks. Remove anything that might make you trip as you walk through a door, such as a raised step or threshold. Trim any bushes or trees on the path to your home. Use bright outdoor lighting. Clear any walking paths of anything that might make someone trip, such as rocks or tools. Regularly check to see if handrails are loose or broken. Make sure that both sides of any steps have handrails. Any raised decks and porches should have guardrails on the edges. Have any leaves, snow, or ice cleared regularly. Use sand or salt on walking paths during winter. Clean up any spills in your garage right away. This includes oil or grease spills.  What can I do in the bathroom? Use night lights. Install grab bars by the toilet and in the tub and shower. Do not use towel bars as grab bars. Use non-skid mats or decals in the tub or shower. If you need to sit down in the shower, use a plastic, non-slip stool. Keep the floor dry. Clean up any water that spills on the floor as soon as it happens. Remove soap buildup in the tub or shower regularly. Attach bath mats securely with double-sided non-slip rug tape. Do not have throw rugs and other things on the floor that can make you trip. What can I do in the bedroom? Use night lights. Make sure that you have a light by your bed that is easy to reach. Do not use any sheets or blankets that are too big for your bed. They should not hang down onto the floor. Have a firm chair that has side arms. You can use this for support while you get dressed. Do not have throw rugs and other things on the floor that can make you trip. What can I do in the kitchen? Clean up any spills right away. Avoid walking on wet floors. Keep items that you use a lot in easy-to-reach places. If you need to reach  something above you, use a strong step stool that has a grab bar. Keep electrical cords out of the way. Do not use floor polish or wax that makes floors slippery. If you must use wax, use non-skid floor wax. Do not have throw rugs and other things on the floor that can make you trip. What can I do with my stairs? Do not leave any items on the stairs. Make sure that there are handrails on both sides of the stairs and use them. Fix handrails that are broken or loose. Make sure that handrails are as long as the stairways. Check any carpeting to make sure that it is firmly attached to the stairs. Fix any carpet that is loose or worn. Avoid having throw rugs at the top or bottom of the stairs. If you do have throw rugs, attach them to the floor with carpet tape. Make sure that you have a light switch at the top of the stairs and the bottom of the stairs. If you do not have them, ask someone to add them for you. What else can I do to help prevent falls? Wear shoes that: Do not have high heels. Have rubber bottoms. Are comfortable and fit you well. Are closed at the toe. Do not wear sandals. If you use a stepladder: Make sure that it is fully opened. Do not climb a closed stepladder. Make sure that both sides of the stepladder are locked into place. Ask someone to hold it for you, if possible. Clearly mark and make sure that you can see: Any grab bars or handrails. First and last steps. Where the edge of each step is. Use tools that help you move around (mobility aids) if they are needed. These include: Canes. Walkers. Scooters. Crutches. Turn on the lights when you go into a dark area. Replace any light bulbs as soon as they burn out. Set up your furniture so you have a clear path. Avoid moving your furniture around. If any of your floors are uneven, fix them. If there are any pets around you, be aware of where they are. Review your medicines with your doctor. Some medicines can make you  feel dizzy. This can increase your chance  of falling. Ask your doctor what other things that you can do to help prevent falls. This information is not intended to replace advice given to you by your health care provider. Make sure you discuss any questions you have with your health care provider. Document Released: 03/11/2009 Document Revised: 10/21/2015 Document Reviewed: 06/19/2014 Elsevier Interactive Patient Education  2017 [employer].

## 2022-09-12 NOTE — Progress Notes (Signed)
I connected with  Enrique Sack on 09/12/22 by a audio enabled telemedicine application and verified that I am speaking with the correct person using two identifiers.  Patient Location: Home  Provider Location: Office/Clinic  I discussed the limitations of evaluation and management by telemedicine. The patient expressed understanding and agreed to proceed.  Subjective:   Valerie [name] is a 72 y.o. female who presents for Medicare Annual (Subsequent) preventive examination.  Review of Systems      Cardiac Risk Factors include: advanced age (>12men, >71 women);hypertension;sedentary lifestyle     Objective:    Today's Vitals   09/12/22 0848  Weight: 124 lb (56.2 kg)  Height: 5\' 4"  (1.626 m)   Body mass index is 21.28 kg/m.     09/12/2022    8:55 AM 07/17/2022   10:17 AM 01/04/2022    8:39 PM 09/15/2021   10:40 AM 08/01/2021    1:04 PM 07/26/2021    9:24 AM 06/21/2021    3:08 PM  Advanced Directives  Does Patient Have a Medical Advance Directive? No No No No No No No  Would patient like information on creating a medical advance directive? No - Patient declined      Yes (MAU/Ambulatory/Procedural Areas - Information given)    Current Medications (verified) Outpatient Encounter Medications as of 09/12/2022  Medication Sig   acetaminophen (TYLENOL) 325 MG tablet Take 325 mg by mouth 2 (two) times daily as needed (pain).   acetaminophen (TYLENOL) 500 MG tablet Take 500-1,000 mg by mouth every 6 (six) hours as needed for moderate pain.   amLODipine (NORVASC) 5 MG tablet TAKE 1 TABLET EVERY DAY FOR BLOOD PRESSURE   aspirin EC 81 MG tablet Take 81 mg by mouth daily. Swallow whole.   atorvastatin (LIPITOR) 10 MG tablet Take 1 tablet (10 mg total) by mouth daily. for cholesterol.   azelastine (ASTELIN) 0.1 % nasal spray Place 1 spray into both nostrils 2 (two) times daily. Use in each nostril as directed   CALCIUM-VITAMIN D PO Take 2 tablets by mouth daily.   cetirizine (ZYRTEC) 10 MG  tablet Take 1 tablet (10 mg total) by mouth daily as needed for allergies.   DULoxetine (CYMBALTA) 20 MG capsule Take 2 capsules (40 mg total) by mouth daily. For anxiety/depression/pain   EPINEPHrine 0.3 mg/0.3 mL IJ SOAJ injection Inject 0.3 mg into the muscle as directed.   fluticasone (FLONASE) 50 MCG/ACT nasal spray USE 2 SPRAYS IN EACH NOSTRIL EVERY DAY   metoprolol succinate (TOPROL-XL) 50 MG 24 hr tablet TAKE 1 TABLET DAILY FOR HEART RATE/PALPITATIONS   mirabegron ER (MYRBETRIQ) 25 MG TB24 tablet Take 1 tablet (25 mg total) by mouth daily.   Olopatadine HCl 0.2 % SOLN Apply 1 drop to eye daily as needed (itchy watery eyes).   omeprazole (PRILOSEC) 40 MG capsule TAKE 1 CAPSULE TWICE DAILY FOR HEARTBURN   topiramate (TOPAMAX) 50 MG tablet Take 1 tablet (50 mg total) by mouth daily. For headache prevention   valsartan (DIOVAN) 160 MG tablet Take 160 mg by mouth daily.   No facility-administered encounter medications on file as of 09/12/2022.    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   Anal fissure    Anxiety    Atrophic vaginitis    COVID-19 virus infection 09/28/2020   Difficult intubation    limited neck mobility   Esophagitis  Hernia    Present to proximal to umbillical    History of hiatal hernia    Hyperlipidemia    Hypertension    Internal hemorrhoids    Menorrhagia    Osteopenia    PONV (postoperative nausea and vomiting)    Recurrent UTI    Vertigo    Vitamin D deficiency    Past Surgical History:  Procedure Laterality Date   45 HOUR PH STUDY N/A 11/29/2015   Procedure: 24 HOUR PH STUDY;  Surgeon: Ruffin Frederick, MD;  Location: [facility];  Service: Gastroenterology;  Laterality: N/A;   ABDOMINAL HYSTERECTOMY  12/04/2000   also bilateral salpingo-oophorectomy   CARDIAC CATHETERIZATION  2006   CARPAL TUNNEL  RELEASE  11/20/2011   Procedure: CARPAL TUNNEL RELEASE;  Surgeon: Mariam Dollar, MD;  Location: [facility];  Service: Neurosurgery;  Laterality: Left;  LEFT carpal tunnel release   CERVICAL SPINE SURGERY  2013   [facility]   CHOLECYSTECTOMY  1997   COLONOSCOPY WITH PROPOFOL N/A 07/27/2020   Procedure: COLONOSCOPY WITH PROPOFOL;  Surgeon: Midge Minium, MD;  Location: [facility];  Service: Endoscopy;  Laterality: N/A;   DILATION AND CURETTAGE OF UTERUS     11/15/2000   ESOPHAGEAL MANOMETRY N/A 09/20/2015   Procedure: ESOPHAGEAL MANOMETRY (EM);  Surgeon: Ruffin Frederick, MD;  Location: [facility];  Service: Gastroenterology;  Laterality: N/A;   ESOPHAGOGASTRODUODENOSCOPY (EGD) WITH PROPOFOL N/A 05/17/2021   Procedure: ESOPHAGOGASTRODUODENOSCOPY (EGD) WITH PROPOFOL;  Surgeon: Pasty Spillers, MD;  Location: [facility] ENDOSCOPY;  Service: Endoscopy;  Laterality: N/A;   ESOPHAGOGASTRODUODENOSCOPY (EGD) WITH PROPOFOL N/A 07/17/2022   Procedure: ESOPHAGOGASTRODUODENOSCOPY (EGD) WITH PROPOFOL;  Surgeon: Wyline Mood, MD;  Location: [facility] ENDOSCOPY;  Service: Gastroenterology;  Laterality: N/A;   HYSTEROSCOPY  11/15/2000   D & C, resection of endometrial polyps   LUMBAR SPINAL CORD SIMULATOR LEAD REMOVAL N/A 08/01/2021   Procedure: REMOVAL OF SPINAL CORD STIMULATOR;  Surgeon: Venetia Night, MD;  Location: [facility] ORS;  Service: Neurosurgery;  Laterality: N/A;   OOPHORECTOMY  12/04/2000   bilateral salpingo-oophorectomy done with TAH   SPINAL CORD STIMULATOR INSERTION Bilateral 04/12/2020   Procedure: INSERTION CERVICAL SPINAL STIMULATOR PULSE GENERATOR;  Surgeon: Lucy Chris, MD;  Location: [facility] ORS;  Service: Neurosurgery;  Laterality: Bilateral;   SPINAL CORD STIMULATOR TRIAL N/A 03/22/2020   Procedure: CERVICAL SPINAL CORD STIMULATOR TRIAL PERCUTANEOUS;  Surgeon: Lucy Chris, MD;  Location: [facility] ORS;  Service: Neurosurgery;  Laterality: N/A;   TUBAL LIGATION     Family History  Problem  Relation Age of Onset   Hypertension Father    Prostate cancer Father    Hypertension Sister    Lung cancer Sister    Diabetes Brother    Heart disease Brother    Hypertension Brother    Diabetes Brother    Heart disease Brother    Breast cancer Neg Hx    Social History   Socioeconomic History   Marital status: Married    Spouse name: Not on file   Number of children: Not on file   Years of education: Not on file   Highest education level: Not on file  Occupational History   Not on file  Tobacco Use   Smoking status: Never    Passive exposure: Never   Smokeless tobacco: Never  Vaping Use   Vaping Use: Never used  Substance and Sexual Activity   Alcohol use: No   Drug use: Never   Sexual activity: Yes    Birth control/protection:  Post-menopausal, Surgical  Other Topics Concern   Not on file  Social History Narrative   Married   Lives in [place]   Has 2 children, 4 grandchildren   Enjoys walking, shopping.   Social Determinants of Health   Financial Resource Strain: Low Risk  (09/12/2022)   Overall Financial Resource Strain (CARDIA)    Difficulty of Paying Living Expenses: Not hard at all  Food Insecurity: No Food Insecurity (09/12/2022)   Hunger Vital Sign    Worried About Running Out of Food in the Last Year: Never true    Ran Out of Food in the Last Year: Never true  Transportation Needs: No Transportation Needs (09/12/2022)   PRAPARE - Administrator, Civil Service (Medical): No    Lack of Transportation (Non-Medical): No  Physical Activity: Inactive (09/12/2022)   Exercise Vital Sign    Days of Exercise per Week: 0 days    Minutes of Exercise per Session: 0 min  Stress: No Stress Concern Present (09/12/2022)   [employer] of Occupational Health - Occupational Stress Questionnaire    Feeling of Stress : Not at all  Social Connections: Moderately Integrated (09/12/2022)   Social Connection and Isolation Panel [NHANES]    Frequency of  Communication with Friends and Family: More than three times a week    Frequency of Social Gatherings with Friends and Family: More than three times a week    Attends Religious Services: More than 4 times per year    Active Member of [employer] or Organizations: No    Attends Engineer, structural: Never    Marital Status: Married    Tobacco Counseling Counseling given: Not Answered   Clinical Intake:  Pre-visit preparation completed: Yes  Pain : No/denies pain     Nutritional Risks: None Diabetes: No  How often do you need to have someone help you when you read instructions, pamphlets, or other written materials from your doctor or pharmacy?: 1 - Never  Diabetic? no  Interpreter Needed?: No  Information entered by :: C.Brownie Gockel LPN   Activities of Daily Living    09/12/2022    8:56 AM  In your present state of health, do you have any difficulty performing the following activities:  Hearing? 0  Vision? 0  Difficulty concentrating or making decisions? 0  Walking or climbing stairs? 0  Dressing or bathing? 0  Doing errands, shopping? 0  Preparing Food and eating ? N  Using the Toilet? N  In the past six months, have you accidently leaked urine? N  Do you have problems with loss of bowel control? N  Managing your Medications? N  Managing your Finances? N  Housekeeping or managing your Housekeeping? N    Patient Care Team: Doreene Nest, NP as PCP - General (Internal Medicine) Kathyrn Sheriff, [facility] as Pharmacist (Pharmacist)  Indicate any recent Medical Services you may have received from other than [facility] providers in the past year (date may be approximate).     Assessment:   This is a routine wellness examination for [place]ley.  Hearing/Vision screen Hearing Screening - Comments:: No aids Vision Screening - Comments:: No glasses - [facility]  Dietary issues and exercise activities discussed: Current Exercise Habits: The patient does not  participate in regular exercise at present, Exercise limited by: None identified   Goals Addressed   None    Depression Screen    09/12/2022    8:55 AM 06/21/2021    3:11 PM 06/29/2020  10:42 AM 10/21/2019   12:04 PM 02/11/2018    2:38 PM 01/01/2018    2:52 PM 12/26/2016   11:30 AM  PHQ 2/9 Scores  PHQ - 2 Score 0 0 3 0 0 0 0  PHQ- 9 Score   12 0  0 0    Fall Risk    09/12/2022    8:56 AM 06/21/2021    3:10 PM 12/08/2020    9:46 AM 10/21/2019   12:04 PM 02/11/2018    2:38 PM  Fall Risk   Falls in the past year? 0 0 0 0 No  Number falls in past yr: 0 0 0 0   Injury with Fall? 0 0 0 0   Risk for fall due to : No Fall Risks No Fall Risks  Medication side effect   Follow up Falls prevention discussed;Falls evaluation completed Falls prevention discussed  Falls evaluation completed;Falls prevention discussed     FALL RISK PREVENTION PERTAINING TO THE HOME:  Any stairs in or around the home? No  If so, are there any without handrails? No  Home free of loose throw rugs in walkways, pet beds, electrical cords, etc? Yes  Adequate lighting in your home to reduce risk of falls? Yes   ASSISTIVE DEVICES UTILIZED TO PREVENT FALLS:  Life alert? No  Use of a cane, walker or w/c? No  Grab bars in the bathroom? No  Shower chair or bench in shower? No  Elevated toilet seat or a handicapped toilet? No   Cognitive Function:    10/21/2019   12:08 PM  MMSE - Mini Mental State Exam  Orientation to time 5  Orientation to Place 5  Registration 3  Attention/ Calculation 5  Recall 3  Language- repeat 1        09/12/2022    8:57 AM  6CIT Screen  What Year? 0 points  What month? 0 points  What time? 0 points  Count back from 20 0 points  Months in reverse 0 points  Repeat phrase 0 points  Total Score 0 points    Immunizations Immunization History  Administered Date(s) Administered   Fluad Quad(high Dose 65+) 02/14/2019, 02/23/2021   Hepatitis B 11/25/2015, 05/29/2016   Hepatitis  B, ADULT 09/22/2015   Influenza, High Dose Seasonal PF 05/29/2016   Influenza,inj,Quad PF,6+ Mos 02/11/2018, 03/14/2022   Influenza-Unspecified 02/27/2015, 04/19/2020   PFIZER(Purple Top)SARS-COV-2 Vaccination 06/20/2019, 07/11/2019, 02/26/2020, 01/04/2021   PNEUMOCOCCAL CONJUGATE-20 03/12/2021   Td 05/29/1997   Tdap 10/09/2014, 09/22/2015   Zoster Recombinat (Shingrix) 01/04/2021, 03/12/2021   Zoster, Live 07/28/2015    TDAP status: Up to date  Flu Vaccine status: Up to date  Pneumococcal vaccine status: Up to date  Covid-19 vaccine status: Declined, Education has been provided regarding the importance of this vaccine but patient still declined. Advised may receive this vaccine at local pharmacy or Health Dept.or vaccine clinic. Aware to provide a copy of the vaccination record if obtained from local pharmacy or Health Dept. Verbalized acceptance and understanding.  Qualifies for Shingles Vaccine? Yes   Zostavax completed Yes   Shingrix Completed?: Yes  Screening Tests Health Maintenance  Topic Date Due   INFLUENZA VACCINE  12/28/2022   Medicare Annual Wellness (AWV)  09/12/2023   MAMMOGRAM  01/12/2024   DTaP/Tdap/Td (4 - Td or Tdap) 09/21/2025   COLONOSCOPY (Pts 45-73yrs Insurance coverage will need to be confirmed)  07/28/2030   Pneumonia Vaccine 40+ Years old  Completed   Hepatitis C Screening  Completed  Zoster Vaccines- Shingrix  Completed   HPV VACCINES  Aged Out   COVID-19 Vaccine  Discontinued    Health Maintenance  There are no preventive care reminders to display for this patient.  Colorectal cancer screening: Type of screening: Colonoscopy. Completed 07/27/20. Repeat every 10 years  Mammogram status: Completed 01/11/22. Repeat every year  Bone Density status: Completed 01/10/21. Results reflect: Bone density results: OSTEOPENIA. Repeat every 2 years.  Lung Cancer Screening: (Low Dose CT Chest recommended if Age 75-80 years, 30 pack-year currently smoking  OR have quit w/in 15years.) does not qualify.   Lung Cancer Screening Referral: no  Additional Screening:  Hepatitis C Screening: does qualify; Completed 11/05/15  Vision Screening: Recommended annual ophthalmology exams for early detection of glaucoma and other disorders of the eye. Is the patient up to date with their annual eye exam?  Yes  Who is the provider or what is the name of the office in which the patient attends annual eye exams? [facility] If pt is not established with a provider, would they like to be referred to a provider to establish care? No .   Dental Screening: Recommended annual dental exams for proper oral hygiene  Community Resource Referral / Chronic Care Management: CRR required this visit?  No   CCM required this visit?  No      Plan:     I have personally reviewed and noted the following in the patient's chart:   Medical and social history Use of alcohol, tobacco or illicit drugs  Current medications and supplements including opioid prescriptions. Patient is not currently taking opioid prescriptions. Functional ability and status Nutritional status Physical activity Advanced directives List of other physicians Hospitalizations, surgeries, and ER visits in previous 12 months Vitals Screenings to include cognitive, depression, and falls Referrals and appointments  In addition, I have reviewed and discussed with patient certain preventive protocols, quality metrics, and best practice recommendations. A written personalized care plan for preventive services as well as general preventive health recommendations were provided to patient.     Maryan Puls, LPN   1/61/0960   Nurse Notes: none

## 2022-09-14 ENCOUNTER — Inpatient Hospital Stay (HOSPITAL_COMMUNITY)
Admission: EM | Admit: 2022-09-14 | Discharge: 2022-09-16 | DRG: 446 | Disposition: A | Discharge status: Home / Self Care | Payer: Medicare HMO | Attending: Internal Medicine | Admitting: Internal Medicine

## 2022-09-14 ENCOUNTER — Encounter (HOSPITAL_COMMUNITY): Payer: Self-pay | Admitting: Gastroenterology

## 2022-09-14 ENCOUNTER — Encounter (HOSPITAL_COMMUNITY)
Admission: RE | Disposition: A | Discharge status: Home / Self Care | Payer: Self-pay | Source: Home / Self Care | Attending: Gastroenterology

## 2022-09-14 ENCOUNTER — Ambulatory Visit (HOSPITAL_COMMUNITY): Payer: Medicare HMO

## 2022-09-14 ENCOUNTER — Other Ambulatory Visit: Payer: Self-pay

## 2022-09-14 ENCOUNTER — Ambulatory Visit (HOSPITAL_BASED_OUTPATIENT_CLINIC_OR_DEPARTMENT_OTHER)
Admission: RE | Admit: 2022-09-14 | Discharge: 2022-09-14 | Disposition: A | Discharge status: Home / Self Care | Payer: Medicare HMO | Source: Home / Self Care | Attending: Gastroenterology | Admitting: Gastroenterology

## 2022-09-14 ENCOUNTER — Ambulatory Visit (HOSPITAL_BASED_OUTPATIENT_CLINIC_OR_DEPARTMENT_OTHER): Payer: Medicare HMO | Admitting: Anesthesiology

## 2022-09-14 ENCOUNTER — Ambulatory Visit (HOSPITAL_COMMUNITY): Payer: Medicare HMO | Admitting: Anesthesiology

## 2022-09-14 ENCOUNTER — Encounter (HOSPITAL_COMMUNITY): Payer: Self-pay

## 2022-09-14 DIAGNOSIS — F419 Anxiety disorder, unspecified: Secondary | ICD-10-CM | POA: Diagnosis present

## 2022-09-14 DIAGNOSIS — K219 Gastro-esophageal reflux disease without esophagitis: Secondary | ICD-10-CM | POA: Diagnosis present

## 2022-09-14 DIAGNOSIS — R519 Headache, unspecified: Secondary | ICD-10-CM | POA: Diagnosis present

## 2022-09-14 DIAGNOSIS — I959 Hypotension, unspecified: Secondary | ICD-10-CM | POA: Diagnosis not present

## 2022-09-14 DIAGNOSIS — K8041 Calculus of bile duct with cholecystitis, unspecified, with obstruction: Secondary | ICD-10-CM | POA: Diagnosis present

## 2022-09-14 DIAGNOSIS — Q403 Congenital malformation of stomach, unspecified: Secondary | ICD-10-CM | POA: Insufficient documentation

## 2022-09-14 DIAGNOSIS — K3189 Other diseases of stomach and duodenum: Secondary | ICD-10-CM

## 2022-09-14 DIAGNOSIS — Z8249 Family history of ischemic heart disease and other diseases of the circulatory system: Secondary | ICD-10-CM | POA: Diagnosis not present

## 2022-09-14 DIAGNOSIS — K8050 Calculus of bile duct without cholangitis or cholecystitis without obstruction: Secondary | ICD-10-CM | POA: Diagnosis present

## 2022-09-14 DIAGNOSIS — R1010 Upper abdominal pain, unspecified: Secondary | ICD-10-CM | POA: Insufficient documentation

## 2022-09-14 DIAGNOSIS — K2289 Other specified disease of esophagus: Secondary | ICD-10-CM

## 2022-09-14 DIAGNOSIS — K449 Diaphragmatic hernia without obstruction or gangrene: Secondary | ICD-10-CM | POA: Diagnosis present

## 2022-09-14 DIAGNOSIS — Z801 Family history of malignant neoplasm of trachea, bronchus and lung: Secondary | ICD-10-CM

## 2022-09-14 DIAGNOSIS — Z7982 Long term (current) use of aspirin: Secondary | ICD-10-CM

## 2022-09-14 DIAGNOSIS — Z888 Allergy status to other drugs, medicaments and biological substances status: Secondary | ICD-10-CM | POA: Diagnosis not present

## 2022-09-14 DIAGNOSIS — Z79899 Other long term (current) drug therapy: Secondary | ICD-10-CM | POA: Diagnosis not present

## 2022-09-14 DIAGNOSIS — K838 Other specified diseases of biliary tract: Secondary | ICD-10-CM

## 2022-09-14 DIAGNOSIS — I10 Essential (primary) hypertension: Secondary | ICD-10-CM

## 2022-09-14 DIAGNOSIS — R1013 Epigastric pain: Secondary | ICD-10-CM | POA: Diagnosis not present

## 2022-09-14 DIAGNOSIS — R1012 Left upper quadrant pain: Secondary | ICD-10-CM | POA: Diagnosis not present

## 2022-09-14 DIAGNOSIS — K2970 Gastritis, unspecified, without bleeding: Secondary | ICD-10-CM | POA: Insufficient documentation

## 2022-09-14 DIAGNOSIS — E785 Hyperlipidemia, unspecified: Secondary | ICD-10-CM | POA: Diagnosis present

## 2022-09-14 DIAGNOSIS — F32A Depression, unspecified: Secondary | ICD-10-CM | POA: Diagnosis present

## 2022-09-14 DIAGNOSIS — N3000 Acute cystitis without hematuria: Principal | ICD-10-CM

## 2022-09-14 DIAGNOSIS — R7989 Other specified abnormal findings of blood chemistry: Secondary | ICD-10-CM | POA: Diagnosis not present

## 2022-09-14 DIAGNOSIS — R933 Abnormal findings on diagnostic imaging of other parts of digestive tract: Secondary | ICD-10-CM

## 2022-09-14 DIAGNOSIS — F418 Other specified anxiety disorders: Secondary | ICD-10-CM | POA: Diagnosis not present

## 2022-09-14 DIAGNOSIS — Z8616 Personal history of COVID-19: Secondary | ICD-10-CM | POA: Diagnosis not present

## 2022-09-14 DIAGNOSIS — E876 Hypokalemia: Secondary | ICD-10-CM | POA: Diagnosis present

## 2022-09-14 DIAGNOSIS — R109 Unspecified abdominal pain: Secondary | ICD-10-CM | POA: Diagnosis present

## 2022-09-14 DIAGNOSIS — R7401 Elevation of levels of liver transaminase levels: Secondary | ICD-10-CM | POA: Diagnosis not present

## 2022-09-14 DIAGNOSIS — R112 Nausea with vomiting, unspecified: Secondary | ICD-10-CM | POA: Diagnosis not present

## 2022-09-14 DIAGNOSIS — K831 Obstruction of bile duct: Secondary | ICD-10-CM

## 2022-09-14 DIAGNOSIS — Z833 Family history of diabetes mellitus: Secondary | ICD-10-CM | POA: Diagnosis not present

## 2022-09-14 DIAGNOSIS — K8051 Calculus of bile duct without cholangitis or cholecystitis with obstruction: Secondary | ICD-10-CM

## 2022-09-14 DIAGNOSIS — Z8542 Personal history of malignant neoplasm of other parts of uterus: Secondary | ICD-10-CM | POA: Diagnosis not present

## 2022-09-14 DIAGNOSIS — R945 Abnormal results of liver function studies: Secondary | ICD-10-CM | POA: Diagnosis not present

## 2022-09-14 HISTORY — PX: SPHINCTEROTOMY: SHX5544

## 2022-09-14 HISTORY — PX: REMOVAL OF STONES: SHX5545

## 2022-09-14 HISTORY — PX: EUS: SHX5427

## 2022-09-14 HISTORY — PX: ESOPHAGOGASTRODUODENOSCOPY (EGD) WITH PROPOFOL: SHX5813

## 2022-09-14 HISTORY — PX: ENDOSCOPIC RETROGRADE CHOLANGIOPANCREATOGRAPHY (ERCP) WITH PROPOFOL: SHX5810

## 2022-09-14 LAB — CBC
HCT: 34.1 % — ABNORMAL LOW (ref 36.0–46.0)
Hemoglobin: 11.4 g/dL — ABNORMAL LOW (ref 12.0–15.0)
MCH: 32.4 pg (ref 26.0–34.0)
MCHC: 33.4 g/dL (ref 30.0–36.0)
MCV: 96.9 fL (ref 80.0–100.0)
Platelets: 178 10*3/uL (ref 150–400)
RBC: 3.52 MIL/uL — ABNORMAL LOW (ref 3.87–5.11)
RDW: 13.6 % (ref 11.5–15.5)
WBC: 6.7 10*3/uL (ref 4.0–10.5)
nRBC: 0 % (ref 0.0–0.2)

## 2022-09-14 SURGERY — UPPER ENDOSCOPIC ULTRASOUND (EUS) RADIAL
Anesthesia: General

## 2022-09-14 MED ORDER — LACTATED RINGERS IV SOLN: INTRAVENOUS | Status: DC

## 2022-09-14 MED ORDER — DEXAMETHASONE SODIUM PHOSPHATE 10 MG/ML IJ SOLN
INTRAMUSCULAR | Status: DC | PRN
  Administered 2022-09-14: 10 mg via INTRAVENOUS

## 2022-09-14 MED ORDER — PHENYLEPHRINE HCL (PRESSORS) 10 MG/ML IV SOLN
INTRAVENOUS | Status: AC
  Filled 2022-09-14: qty 1

## 2022-09-14 MED ORDER — GLUCAGON HCL RDNA (DIAGNOSTIC) 1 MG IJ SOLR
INTRAMUSCULAR | Status: DC | PRN
  Administered 2022-09-14 (×2): .25 mg via INTRAVENOUS

## 2022-09-14 MED ORDER — LIDOCAINE 2% (20 MG/ML) 5 ML SYRINGE
INTRAMUSCULAR | Status: DC | PRN
  Administered 2022-09-14: 100 mg via INTRAVENOUS

## 2022-09-14 MED ORDER — PROPOFOL 10 MG/ML IV BOLUS
INTRAVENOUS | Status: DC | PRN
  Administered 2022-09-14: 140 mg via INTRAVENOUS

## 2022-09-14 MED ORDER — PHENYLEPHRINE 80 MCG/ML (10ML) SYRINGE FOR IV PUSH (FOR BLOOD PRESSURE SUPPORT)
PREFILLED_SYRINGE | INTRAVENOUS | Status: DC | PRN
  Administered 2022-09-14 (×3): 160 ug via INTRAVENOUS
  Administered 2022-09-14: 80 ug via INTRAVENOUS
  Administered 2022-09-14: 240 ug via INTRAVENOUS

## 2022-09-14 MED ORDER — FENTANYL CITRATE (PF) 100 MCG/2ML IJ SOLN
INTRAMUSCULAR | Status: AC
  Filled 2022-09-14: qty 2

## 2022-09-14 MED ORDER — DICLOFENAC SUPPOSITORY 100 MG
RECTAL | Status: AC
  Filled 2022-09-14: qty 1

## 2022-09-14 MED ORDER — EPHEDRINE SULFATE-NACL 50-0.9 MG/10ML-% IV SOSY
PREFILLED_SYRINGE | INTRAVENOUS | Status: DC | PRN
  Administered 2022-09-14: 10 mg via INTRAVENOUS

## 2022-09-14 MED ORDER — ONDANSETRON HCL 4 MG/2ML IJ SOLN
4.0000 mg | Freq: Once | INTRAMUSCULAR | Status: AC
  Administered 2022-09-14: 4 mg via INTRAVENOUS

## 2022-09-14 MED ORDER — CIPROFLOXACIN IN D5W 400 MG/200ML IV SOLN
INTRAVENOUS | Status: DC | PRN
  Administered 2022-09-14: 400 mg via INTRAVENOUS

## 2022-09-14 MED ORDER — FENTANYL CITRATE (PF) 100 MCG/2ML IJ SOLN
INTRAMUSCULAR | Status: DC | PRN
  Administered 2022-09-14: 50 ug via INTRAVENOUS

## 2022-09-14 MED ORDER — DICLOFENAC SUPPOSITORY 100 MG
RECTAL | Status: DC | PRN
  Administered 2022-09-14: 100 mg via RECTAL

## 2022-09-14 MED ORDER — ONDANSETRON HCL 4 MG/2ML IJ SOLN
INTRAMUSCULAR | Status: AC
  Filled 2022-09-14: qty 2

## 2022-09-14 MED ORDER — GLUCAGON HCL RDNA (DIAGNOSTIC) 1 MG IJ SOLR
INTRAMUSCULAR | Status: AC
  Filled 2022-09-14: qty 2

## 2022-09-14 MED ORDER — PHENYLEPHRINE HCL-NACL 20-0.9 MG/250ML-% IV SOLN
INTRAVENOUS | Status: DC | PRN
  Administered 2022-09-14: 50 ug/min via INTRAVENOUS

## 2022-09-14 MED ORDER — ONDANSETRON HCL 4 MG/2ML IJ SOLN
INTRAMUSCULAR | Status: DC | PRN
  Administered 2022-09-14: 4 mg via INTRAVENOUS

## 2022-09-14 MED ORDER — CIPROFLOXACIN IN D5W 400 MG/200ML IV SOLN
INTRAVENOUS | Status: AC
  Filled 2022-09-14: qty 200

## 2022-09-14 MED ORDER — SODIUM CHLORIDE 0.9 % IV SOLN
INTRAVENOUS | Status: DC | PRN
  Administered 2022-09-14: 15 mL

## 2022-09-14 MED ORDER — SODIUM CHLORIDE 0.9 % IV SOLN: INTRAVENOUS | Status: DC

## 2022-09-14 MED ORDER — SUGAMMADEX SODIUM 500 MG/5ML IV SOLN
INTRAVENOUS | Status: DC | PRN
  Administered 2022-09-14: 230 mg via INTRAVENOUS

## 2022-09-14 MED ORDER — ROCURONIUM BROMIDE 10 MG/ML (PF) SYRINGE
PREFILLED_SYRINGE | INTRAVENOUS | Status: DC | PRN
  Administered 2022-09-14: 50 mg via INTRAVENOUS

## 2022-09-14 NOTE — Op Note (Signed)
[facility] Patient Name: Valerie [name] Procedure Date: 09/14/2022 MRN: [id number] Attending MD: Corliss Parish , MD, [id number] Date of Birth: 1950/11/03 CSN: [id number] Age: 72 Admit Type: Outpatient Procedure:                Upper EUS Indications:              Choledocholithiasis on MRCP, Obstruction of bile                            duct on MRCP, Upper abdominal pain Providers:                Corliss Parish, MD, Margaree Mackintosh, RN,                            Kandice Robinsons, Technician, Lorenza Evangelist, RN,                            Sunday Corn Mbumina, Technician Referring MD:             Wyline Mood MD, MD, Lance Bosch Medicines:                General Anesthesia Complications:            No immediate complications. Estimated Blood Loss:     Estimated blood loss was minimal. Procedure:                Pre-Anesthesia Assessment:                           - Prior to the procedure, a History and Physical                            was performed, and patient medications and                            allergies were reviewed. The patient's tolerance of                            previous anesthesia was also reviewed. The risks                            and benefits of the procedure and the sedation                            options and risks were discussed with the patient.                            All questions were answered, and informed consent                            was obtained. Prior Anticoagulants: The patient has                            taken no anticoagulant or antiplatelet agents  except for aspirin. ASA Grade Assessment: III - A                            patient with severe systemic disease. After                            reviewing the risks and benefits, the patient was                            deemed in satisfactory condition to undergo the                            procedure.                            After obtaining informed consent, the endoscope was                            passed under direct vision. Throughout the                            procedure, the patient's blood pressure, pulse, and                            oxygen saturations were monitored continuously. The                            [id number]-[id number] ([id number] Olympus endoscope was introduced                            through the mouth, and advanced to the second part                            of duodenum. The [id number]-[id number] ([id number]) Olympus                            duodenoscope was introduced through the mouth, and                            advanced to the area of papilla. The upper EUS was                            accomplished without difficulty. The patient                            tolerated the procedure. Scope In: Scope Out: Findings:      ENDOSCOPIC FINDING: :      No gross lesions were noted in the entire esophagus.      The Z-line was irregular and was found 36 cm from the incisors.      A 1 cm hiatal hernia was present.      A deformity was found in the stomach.      Patchy mild inflammation characterized by erosions, erythema and       friability was found in the  entire examined stomach.      No gross lesions were noted in the duodenal bulb, in the first portion       of the duodenum and in the second portion of the duodenum.      The major papilla was normal.      ENDOSONOGRAPHIC FINDING: :      Minimal hyperechoic material consistent with sludge was visualized       endosonographically in the common bile duct.      There was no sign of significant endosonographic abnormality in the       pancreatic head.      Endosonographic imaging of the ampulla showed no intramural       (subepithelial) lesion.      Endosonographic imaging in the visualized portion of the liver showed no       mass.      The celiac region was visualized. Impression:               EGD Impression:                           - No  gross lesions in the entire esophagus. Z-line                            irregular, 36 cm from the incisors.                           - 1 cm hiatal hernia.                           - J-shaped deformity.                           - Gastritis. Previously biopsied at outside                            facility so not rebiopsy.                           - No gross lesions in the duodenal bulb, in the                            first portion of the duodenum and in the second                            portion of the duodenum.                           - Normal major papilla (hidden under a hood).                           EUS impression:                           - Hyperechoic material consistent with sludge was                            visualized endosonographically in the common bile  duct.                           - There was no sign of significant pathology in the                            pancreatic head. Moderate Sedation:      Not Applicable - Patient had care per Anesthesia. Recommendation:           - Proceed to scheduled ERCP.                           - Observe patient's clinical course.                           - The findings and recommendations were discussed                            with the patient.                           - The findings and recommendations were discussed                            with the patient's family. Procedure Code(s):        --- Professional ---                           [phone], Esophagogastroduodenoscopy, flexible,                            transoral; with endoscopic ultrasound examination                            limited to the esophagus, stomach or duodenum, and                            adjacent structures Diagnosis Code(s):        --- Professional ---                           K22.89, Other specified disease of esophagus                           K44.9, Diaphragmatic hernia without obstruction or                             gangrene                           K31.89, Other diseases of stomach and duodenum                           K29.70, Gastritis, unspecified, without bleeding                           K80.50, Calculus of bile duct without cholangitis  or cholecystitis without obstruction                           K83.1, Obstruction of bile duct                           R10.10, Upper abdominal pain, unspecified                           K83.8, Other specified diseases of biliary tract CPT copyright 2022 American Medical Association. All rights reserved. The codes documented in this report are preliminary and upon coder review may  be revised to meet current compliance requirements. Corliss Parish, MD 09/14/2022 1:42:04 PM Number of Addenda: 0

## 2022-09-14 NOTE — Anesthesia Preprocedure Evaluation (Signed)
Anesthesia Evaluation  Patient identified by MRN, date of birth, ID band Patient awake    Reviewed: Allergy & Precautions, NPO status , Patient's Chart, lab work & pertinent test results, reviewed documented beta blocker date and time   History of Anesthesia Complications (+) PONV, DIFFICULT AIRWAY and history of anesthetic complications  Airway Mallampati: III  TM Distance: >3 FB Neck ROM: Limited    Dental  (+) Dental Advisory Given, Upper Dentures, Edentulous Lower   Pulmonary neg pulmonary ROS   Pulmonary exam normal breath sounds clear to auscultation       Cardiovascular hypertension, Pt. on medications Normal cardiovascular exam Rhythm:Regular Rate:Normal     Neuro/Psych  Headaches PSYCHIATRIC DISORDERS Anxiety Depression       GI/Hepatic Neg liver ROS, hiatal hernia,GERD  Medicated,,Choledocholithiasis   Endo/Other    Renal/GU negative Renal ROS  negative genitourinary   Musculoskeletal negative musculoskeletal ROS (+)    Abdominal   Peds  Hematology   Anesthesia Other Findings   Reproductive/Obstetrics                              Anesthesia Physical Anesthesia Plan  ASA: 3  Anesthesia Plan: General   Post-op Pain Management: Minimal or no pain anticipated and Dilaudid IV   Induction: Intravenous  PONV Risk Score and Plan: 4 or greater and TIVA, Treatment may vary due to age or medical condition, Propofol infusion and Ondansetron  Airway Management Planned: Video Laryngoscope Planned and Oral ETT  Additional Equipment: None  Intra-op Plan:   Post-operative Plan: Extubation in OR  Informed Consent: I have reviewed the patients History and Physical, chart, labs and discussed the procedure including the risks, benefits and alternatives for the proposed anesthesia with the patient or authorized representative who has indicated his/her understanding and acceptance.      Dental advisory given  Plan Discussed with: Anesthesiologist and CRNA  Anesthesia Plan Comments:          Anesthesia Quick Evaluation

## 2022-09-14 NOTE — ED Triage Notes (Signed)
Patient reports she had endoscopy today and this evening started having abd pain, n/v. States she was in pain yesterday as well but tonight is worse.

## 2022-09-14 NOTE — Anesthesia Procedure Notes (Signed)
Procedure Name: Intubation Date/Time: 09/14/2022 12:21 PM  Performed by: Doran Clay, CRNAPre-anesthesia Checklist: Patient identified, Emergency Drugs available, Suction available, Patient being monitored and Timeout performed Patient Re-evaluated:Patient Re-evaluated prior to induction Oxygen Delivery Method: Circle system utilized Preoxygenation: Pre-oxygenation with 100% oxygen Induction Type: IV induction Ventilation: Mask ventilation without difficulty Laryngoscope Size: Mac and 3 Grade View: Grade I Tube type: Oral Tube size: 7.0 mm Number of attempts: 1 Airway Equipment and Method: Stylet Placement Confirmation: ETT inserted through vocal cords under direct vision, positive ETCO2 and breath sounds checked- equal and bilateral Secured at: 20 cm Tube secured with: Tape Dental Injury: Teeth and Oropharynx as per pre-operative assessment

## 2022-09-14 NOTE — Op Note (Signed)
[facility] Patient Name: Valerie [name] Procedure Date: 09/14/2022 MRN: [id number] Attending MD: Corliss Parish , MD, [id number] Date of Birth: 1950-12-28 CSN: [id number] Age: 72 Admit Type: Outpatient Procedure:                ERCP Indications:              Bile duct stone(s), Epigastric abdominal pain Providers:                Corliss Parish, MD, Margaree Mackintosh, RN,                            Lorenza Evangelist, RN, Faustina Mbumina, Technician,                            Harrington Challenger, Technician Referring MD:             Lance Bosch, Wyline Mood MD, MD Medicines:                General Anesthesia, Cipro 400 mg IV, Glucagon 0.5                            mg IV, Diclofenac 100 mg rectal Complications:            No immediate complications. Estimated Blood Loss:     Estimated blood loss was minimal. Procedure:                Pre-Anesthesia Assessment:                           - Prior to the procedure, a History and Physical                            was performed, and patient medications and                            allergies were reviewed. The patient's tolerance of                            previous anesthesia was also reviewed. The risks                            and benefits of the procedure and the sedation                            options and risks were discussed with the patient.                            All questions were answered, and informed consent                            was obtained. Prior Anticoagulants: The patient has                            taken no anticoagulant or antiplatelet agents  except for aspirin. ASA Grade Assessment: III - A                            patient with severe systemic disease. After                            reviewing the risks and benefits, the patient was                            deemed in satisfactory condition to undergo the                            procedure.                            After obtaining informed consent, the scope was                            passed under direct vision. Throughout the                            procedure, the patient's blood pressure, pulse, and                            oxygen saturations were monitored continuously. The                            TJF-Q190V ([id number] Olympus duodenoscope was                            introduced through the mouth, and used to inject                            contrast into and used to inject contrast into the                            bile duct. The ERCP was accomplished without                            difficulty. The patient tolerated the procedure. Scope In: Scope Out: Findings:      A scout film of the abdomen was obtained. Surgical clips, consistent       with a previous cholecystectomy, were seen in the area of the right       upper quadrant of the abdomen.      The esophagus was successfully intubated under direct vision without       detailed examination of the pharynx, larynx, and associated structures,       and upper GI tract. The upper GI tract was grossly normal. The major       papilla was normal (hidden under a hood).      A short 0.025 inch revolution Al Pimple was passed into the biliary tree.       The revolution Jagtome sphincterotome was passed over the guidewire and       the bile duct was then deeply cannulated.  Contrast was injected. I       personally interpreted the bile duct images. Ductal flow of contrast was       adequate. Image quality was adequate. Contrast extended to the hepatic       ducts. Opacification of the entire biliary tree except for the       gallbladder was successful. The maximum diameter of the ducts was 8 mm.       A 6 mm biliary sphincterotomy was made with a monofilament Jagtome       sphincterotome using ERBE electrocautery. There was no       post-sphincterotomy bleeding. To discover objects, the biliary tree was       swept with a  retrieval balloon. Sludge was swept from the duct. An       occlusion cholangiogram was performed that showed no further significant       biliary pathology.      A pancreatogram was not performed.      The duodenoscope was withdrawn from the patient. Impression:               - The major papilla appeared normal.                           - A biliary sphincterotomy was performed.                           - The biliary tree was swept and sludge was found. Moderate Sedation:      Not Applicable - Patient had care per Anesthesia. Recommendation:           - The patient will be observed post-procedure,                            until all discharge criteria are met.                           - Discharge patient to home.                           - Patient has a contact number available for                            emergencies. The signs and symptoms of potential                            delayed complications were discussed with the                            patient. Return to normal activities tomorrow.                            Written discharge instructions were provided to the                            patient.                           - Low fat diet.                           -  Observe patient's clinical course.                           - Monitor LFTs with primary GI in the next 2-4                            weeks.                           - Continue present medications otherwise.                           - The findings and recommendations were discussed                            with the patient.                           - The findings and recommendations were discussed                            with the patient's family. Procedure Code(s):        --- Professional ---                           [phone], Endoscopic retrograde                            cholangiopancreatography (ERCP); with removal of                            calculi/debris from biliary/pancreatic  duct(s)                           43262, Endoscopic retrograde                            cholangiopancreatography (ERCP); with                            sphincterotomy/papillotomy                           74328, 26, Endoscopic catheterization of the                            biliary ductal system, radiological supervision and                            interpretation Diagnosis Code(s):        --- Professional ---                           K80.50, Calculus of bile duct without cholangitis                            or cholecystitis without obstruction  R10.13, Epigastric pain CPT copyright 2022 American Medical Association. All rights reserved. The codes documented in this report are preliminary and upon coder review may  be revised to meet current compliance requirements. Corliss Parish, MD 09/14/2022 1:48:43 PM Number of Addenda: 0

## 2022-09-14 NOTE — Transfer of Care (Signed)
Immediate Anesthesia Transfer of Care Note  Patient: ANNIYAH MOOD  Procedure(s) Performed: UPPER ENDOSCOPIC ULTRASOUND (EUS) RADIAL ENDOSCOPIC RETROGRADE CHOLANGIOPANCREATOGRAPHY (ERCP) WITH PROPOFOL SPHINCTEROTOMY REMOVAL OF STONES  Patient Location: PACU and Endoscopy Unit  Anesthesia Type:General  Level of Consciousness: awake, alert , and patient cooperative  Airway & Oxygen Therapy: Patient Spontanous Breathing and Patient connected to nasal cannula oxygen  Post-op Assessment: Report given to RN and Post -op Vital signs reviewed and stable  Post vital signs: Reviewed and stable  Last Vitals:  Vitals Value Taken Time  BP 132/72 1338 09/14/22  Temp    Pulse    Resp    SpO2      Last Pain:  Vitals:   09/14/22 1010  TempSrc: Temporal  PainSc: 0-No pain         Complications: No notable events documented.

## 2022-09-14 NOTE — H&P (Signed)
GASTROENTEROLOGY PROCEDURE H&P NOTE   Primary Care Physician: Lance Bosch, NP  HPI: Valerie [name] is a 72 y.o. female who presents for EUS +/- ERCP to evaluate possible choledocholithiasis on MRI/MRCP with normal LFTs and abdominal pain.  Past Medical History:  Diagnosis Date   Acid reflux    Adenocarcinoma of the endometrium/uterus 12/04/2000   Anal fissure    Anxiety    Atrophic vaginitis    COVID-19 virus infection 09/28/2020   Difficult intubation    limited neck mobility   Esophagitis    Hernia    Present to proximal to umbillical    History of hiatal hernia    Hyperlipidemia    Hypertension    Internal hemorrhoids    Menorrhagia    Osteopenia    PONV (postoperative nausea and vomiting)    Recurrent UTI    Vertigo    Vitamin D deficiency    Past Surgical History:  Procedure Laterality Date   31 HOUR PH STUDY N/A 11/29/2015   Procedure: 24 HOUR PH STUDY;  Surgeon: Ruffin Frederick, MD;  Location: [facility];  Service: Gastroenterology;  Laterality: N/A;   ABDOMINAL HYSTERECTOMY  12/04/2000   also bilateral salpingo-oophorectomy   CARDIAC CATHETERIZATION  2006   CARPAL TUNNEL RELEASE  11/20/2011   Procedure: CARPAL TUNNEL RELEASE;  Surgeon: Mariam Dollar, MD;  Location: [facility];  Service: Neurosurgery;  Laterality: Left;  LEFT carpal tunnel release   CERVICAL SPINE SURGERY  2013   [facility]   CHOLECYSTECTOMY  1997   COLONOSCOPY WITH PROPOFOL N/A 07/27/2020   Procedure: COLONOSCOPY WITH PROPOFOL;  Surgeon: Midge Minium, MD;  Location: [facility] ENDOSCOPY;  Service: Endoscopy;  Laterality: N/A;   DILATION AND CURETTAGE OF UTERUS     11/15/2000   ESOPHAGEAL MANOMETRY N/A 09/20/2015   Procedure: ESOPHAGEAL MANOMETRY (EM);  Surgeon: Ruffin Frederick, MD;  Location: [facility];  Service: Gastroenterology;  Laterality: N/A;   ESOPHAGOGASTRODUODENOSCOPY (EGD) WITH PROPOFOL N/A 05/17/2021   Procedure: ESOPHAGOGASTRODUODENOSCOPY (EGD) WITH PROPOFOL;  Surgeon:  Pasty Spillers, MD;  Location: [facility] ENDOSCOPY;  Service: Endoscopy;  Laterality: N/A;   ESOPHAGOGASTRODUODENOSCOPY (EGD) WITH PROPOFOL N/A 07/17/2022   Procedure: ESOPHAGOGASTRODUODENOSCOPY (EGD) WITH PROPOFOL;  Surgeon: Wyline Mood, MD;  Location: [facility]COPY;  Service: Gastroenterology;  Laterality: N/A;   HYSTEROSCOPY  11/15/2000   D & C, resection of endometrial polyps   LUMBAR SPINAL CORD SIMULATOR LEAD REMOVAL N/A 08/01/2021   Procedure: REMOVAL OF SPINAL CORD STIMULATOR;  Surgeon: Venetia Night, MD;  Location: [facility] ORS;  Service: Neurosurgery;  Laterality: N/A;   OOPHORECTOMY  12/04/2000   bilateral salpingo-oophorectomy done with TAH   SPINAL CORD STIMULATOR INSERTION Bilateral 04/12/2020   Procedure: INSERTION CERVICAL SPINAL STIMULATOR PULSE GENERATOR;  Surgeon: Lucy Chris, MD;  Location: [facility] ORS;  Service: Neurosurgery;  Laterality: Bilateral;   SPINAL CORD STIMULATOR TRIAL N/A 03/22/2020   Procedure: CERVICAL SPINAL CORD STIMULATOR TRIAL PERCUTANEOUS;  Surgeon: Lucy Chris, MD;  Location: [facility] ORS;  Service: Neurosurgery;  Laterality: N/A;   TUBAL LIGATION     Current Facility-Administered Medications  Medication Dose Route Frequency Provider Last Rate Last Admin   0.9 %  sodium chloride infusion   Intravenous Continuous Mansouraty, Netty Starring., MD       lactated ringers infusion   Intravenous Continuous Mansouraty, Netty Starring., MD 50 mL/hr at 09/14/22 1018 New Bag at 09/14/22 1018    Current Facility-Administered Medications:    0.9 %  sodium chloride infusion, , Intravenous, Continuous, Mansouraty,  Netty Starring., MD   lactated ringers infusion, , Intravenous, Continuous, Mansouraty, Netty Starring., MD, Last Rate: 50 mL/hr at 09/14/22 1018, New Bag at 09/14/22 1018 Allergies  Allergen Reactions   Bactrim [Sulfamethoxazole-Trimethoprim] Other (See Comments)    Mouth ulcers and sores   Family History  Problem Relation Age of Onset   Hypertension Father    Prostate  cancer Father    Hypertension Sister    Lung cancer Sister    Diabetes Brother    Heart disease Brother    Hypertension Brother    Diabetes Brother    Heart disease Brother    Breast cancer Neg Hx    Social History   Socioeconomic History   Marital status: Married    Spouse name: Not on file   Number of children: Not on file   Years of education: Not on file   Highest education level: Not on file  Occupational History   Not on file  Tobacco Use   Smoking status: Never    Passive exposure: Never   Smokeless tobacco: Never  Vaping Use   Vaping Use: Never used  Substance and Sexual Activity   Alcohol use: No   Drug use: Never   Sexual activity: Yes    Birth control/protection: Post-menopausal, Surgical  Other Topics Concern   Not on file  Social History Narrative   Married   Lives in [place]   Has 2 children, 4 grandchildren   Enjoys walking, shopping.   Social Determinants of Health   Financial Resource Strain: Low Risk  (09/12/2022)   Overall Financial Resource Strain (CARDIA)    Difficulty of Paying Living Expenses: Not hard at all  Food Insecurity: No Food Insecurity (09/12/2022)   Hunger Vital Sign    Worried About Running Out of Food in the Last Year: Never true    Ran Out of Food in the Last Year: Never true  Transportation Needs: No Transportation Needs (09/12/2022)   PRAPARE - Administrator, Civil Service (Medical): No    Lack of Transportation (Non-Medical): No  Physical Activity: Inactive (09/12/2022)   Exercise Vital Sign    Days of Exercise per Week: 0 days    Minutes of Exercise per Session: 0 min  Stress: No Stress Concern Present (09/12/2022)   [employer] of Occupational Health - Occupational Stress Questionnaire    Feeling of Stress : Not at all  Social Connections: Moderately Integrated (09/12/2022)   Social Connection and Isolation Panel [NHANES]    Frequency of Communication with Friends and Family: More than three times a  week    Frequency of Social Gatherings with Friends and Family: More than three times a week    Attends Religious Services: More than 4 times per year    Active Member of [employer] or Organizations: No    Attends Banker Meetings: Never    Marital Status: Married  Catering manager Violence: Not At Risk (09/12/2022)   Humiliation, Afraid, Rape, and Kick questionnaire    Fear of Current or Ex-Partner: No    Emotionally Abused: No    Physically Abused: No    Sexually Abused: No    Physical Exam: Today's Vitals   09/14/22 1010  BP: 136/84  Pulse: 67  Resp: (!) 24  Temp: 98.1 F (36.7 C)  TempSrc: Temporal  SpO2: 98%  Weight: 56.2 kg  Height:  (1.626 m)  PainSc: 0-No pain   Body mass index is 21.27 kg/m. GEN: NAD EYE: Sclerae  anicteric ENT: MMM CV: Non-tachycardic GI: Soft, NT/ND NEURO:  Alert & Oriented x 3  Lab Results: No results for input(s): "WBC", "HGB", "HCT", "PLT" in the last 72 hours. BMET No results for input(s): "NA", "K", "CL", "CO2", "GLUCOSE", "BUN", "CREATININE", "CALCIUM" in the last 72 hours. LFT No results for input(s): "PROT", "ALBUMIN", "AST", "ALT", "ALKPHOS", "BILITOT", "BILIDIR", "IBILI" in the last 72 hours. PT/INR No results for input(s): "LABPROT", "INR" in the last 72 hours.   Impression / Plan: This is a 72 y.o.female who presents for EUS +/- ERCP to evaluate possible choledocholithiasis on MRI/MRCP with normal LFTs and abdominal pain.  The risks and benefits of endoscopic evaluation/treatment were discussed with the patient and/or family; these include but are not limited to the risk of perforation, infection, bleeding, missed lesions, lack of diagnosis, severe illness requiring hospitalization, as well as anesthesia and sedation related illnesses.  The patient's history has been reviewed, patient examined, no change in status, and deemed stable for procedure.  The patient and/or family is agreeable to proceed.    Corliss Parish, MD [facility] Advanced Endoscopy Office # [phone]

## 2022-09-14 NOTE — Anesthesia Postprocedure Evaluation (Signed)
Anesthesia Post Note  Patient: Valerie [name]  Procedure(s) Performed: UPPER ENDOSCOPIC ULTRASOUND (EUS) RADIAL ENDOSCOPIC RETROGRADE CHOLANGIOPANCREATOGRAPHY (ERCP) WITH PROPOFOL SPHINCTEROTOMY REMOVAL OF STONES     Patient location during evaluation: PACU Anesthesia Type: General Level of consciousness: awake and alert and oriented Pain management: pain level controlled Vital Signs Assessment: post-procedure vital signs reviewed and stable Respiratory status: spontaneous breathing, nonlabored ventilation and respiratory function stable Cardiovascular status: blood pressure returned to baseline and stable Postop Assessment: no apparent nausea or vomiting Anesthetic complications: no   No notable events documented.  Last Vitals:  Vitals:   09/14/22 1345 09/14/22 1355  BP: 128/72 116/69  Pulse: 90 76  Resp: 20 (!) 21  Temp:    SpO2: 95% 94%    Last Pain:  Vitals:   09/14/22 1355  TempSrc:   PainSc: 4                  Demari Gales A.

## 2022-09-15 ENCOUNTER — Encounter (HOSPITAL_COMMUNITY): Payer: Self-pay | Admitting: Family Medicine

## 2022-09-15 ENCOUNTER — Emergency Department (HOSPITAL_COMMUNITY): Payer: Medicare HMO

## 2022-09-15 DIAGNOSIS — F419 Anxiety disorder, unspecified: Secondary | ICD-10-CM | POA: Diagnosis present

## 2022-09-15 DIAGNOSIS — I959 Hypotension, unspecified: Secondary | ICD-10-CM

## 2022-09-15 DIAGNOSIS — Z888 Allergy status to other drugs, medicaments and biological substances status: Secondary | ICD-10-CM | POA: Diagnosis not present

## 2022-09-15 DIAGNOSIS — K3189 Other diseases of stomach and duodenum: Secondary | ICD-10-CM | POA: Diagnosis present

## 2022-09-15 DIAGNOSIS — K219 Gastro-esophageal reflux disease without esophagitis: Secondary | ICD-10-CM | POA: Diagnosis present

## 2022-09-15 DIAGNOSIS — K8050 Calculus of bile duct without cholangitis or cholecystitis without obstruction: Secondary | ICD-10-CM | POA: Diagnosis not present

## 2022-09-15 DIAGNOSIS — R112 Nausea with vomiting, unspecified: Secondary | ICD-10-CM | POA: Diagnosis not present

## 2022-09-15 DIAGNOSIS — F32A Depression, unspecified: Secondary | ICD-10-CM | POA: Diagnosis present

## 2022-09-15 DIAGNOSIS — E785 Hyperlipidemia, unspecified: Secondary | ICD-10-CM | POA: Diagnosis present

## 2022-09-15 DIAGNOSIS — Z801 Family history of malignant neoplasm of trachea, bronchus and lung: Secondary | ICD-10-CM | POA: Diagnosis not present

## 2022-09-15 DIAGNOSIS — K449 Diaphragmatic hernia without obstruction or gangrene: Secondary | ICD-10-CM | POA: Diagnosis present

## 2022-09-15 DIAGNOSIS — E876 Hypokalemia: Secondary | ICD-10-CM

## 2022-09-15 DIAGNOSIS — Z833 Family history of diabetes mellitus: Secondary | ICD-10-CM | POA: Diagnosis not present

## 2022-09-15 DIAGNOSIS — R109 Unspecified abdominal pain: Secondary | ICD-10-CM

## 2022-09-15 DIAGNOSIS — K8041 Calculus of bile duct with cholecystitis, unspecified, with obstruction: Secondary | ICD-10-CM | POA: Diagnosis present

## 2022-09-15 DIAGNOSIS — I10 Essential (primary) hypertension: Secondary | ICD-10-CM | POA: Diagnosis present

## 2022-09-15 DIAGNOSIS — R7989 Other specified abnormal findings of blood chemistry: Secondary | ICD-10-CM

## 2022-09-15 DIAGNOSIS — Z79899 Other long term (current) drug therapy: Secondary | ICD-10-CM | POA: Diagnosis not present

## 2022-09-15 DIAGNOSIS — K838 Other specified diseases of biliary tract: Secondary | ICD-10-CM | POA: Diagnosis present

## 2022-09-15 DIAGNOSIS — Z8542 Personal history of malignant neoplasm of other parts of uterus: Secondary | ICD-10-CM | POA: Diagnosis not present

## 2022-09-15 DIAGNOSIS — Z8249 Family history of ischemic heart disease and other diseases of the circulatory system: Secondary | ICD-10-CM | POA: Diagnosis not present

## 2022-09-15 DIAGNOSIS — K2289 Other specified disease of esophagus: Secondary | ICD-10-CM | POA: Diagnosis present

## 2022-09-15 DIAGNOSIS — Z7982 Long term (current) use of aspirin: Secondary | ICD-10-CM | POA: Diagnosis not present

## 2022-09-15 DIAGNOSIS — Z8616 Personal history of COVID-19: Secondary | ICD-10-CM | POA: Diagnosis not present

## 2022-09-15 DIAGNOSIS — K2970 Gastritis, unspecified, without bleeding: Secondary | ICD-10-CM | POA: Diagnosis present

## 2022-09-15 DIAGNOSIS — R519 Headache, unspecified: Secondary | ICD-10-CM | POA: Diagnosis present

## 2022-09-15 HISTORY — DX: Unspecified abdominal pain: R10.9

## 2022-09-15 HISTORY — DX: Hypokalemia: E87.6

## 2022-09-15 LAB — COMPREHENSIVE METABOLIC PANEL
ALT: 107 U/L — ABNORMAL HIGH (ref 0–44)
AST: 241 U/L — ABNORMAL HIGH (ref 15–41)
Albumin: 3.6 g/dL (ref 3.5–5.0)
Alkaline Phosphatase: 75 U/L (ref 38–126)
Anion gap: 9 (ref 5–15)
BUN: 18 mg/dL (ref 8–23)
CO2: 23 mmol/L (ref 22–32)
Calcium: 9.1 mg/dL (ref 8.9–10.3)
Chloride: 104 mmol/L (ref 98–111)
Creatinine, Ser: 1.04 mg/dL — ABNORMAL HIGH (ref 0.44–1.00)
GFR, Estimated: 58 mL/min — ABNORMAL LOW (ref >60)
Glucose, Bld: 149 mg/dL — ABNORMAL HIGH (ref 70–99)
Potassium: 3.1 mmol/L — ABNORMAL LOW (ref 3.5–5.1)
Sodium: 136 mmol/L (ref 135–145)
Total Bilirubin: 1.2 mg/dL (ref 0.3–1.2)
Total Protein: 7 g/dL (ref 6.5–8.1)

## 2022-09-15 LAB — URINALYSIS, ROUTINE W REFLEX MICROSCOPIC
Bilirubin Urine: NEGATIVE
Glucose, UA: NEGATIVE mg/dL
Ketones, ur: NEGATIVE mg/dL
Nitrite: NEGATIVE
Protein, ur: 30 mg/dL — AB
Specific Gravity, Urine: 1.025 (ref 1.005–1.030)
WBC, UA: >50 WBC/hpf (ref 0–5)
pH: 5 (ref 5.0–8.0)

## 2022-09-15 LAB — HIV ANTIBODY (ROUTINE TESTING W REFLEX): HIV Screen 4th Generation wRfx: NONREACTIVE

## 2022-09-15 LAB — LIPASE, BLOOD: Lipase: 50 U/L (ref 11–51)

## 2022-09-15 LAB — MAGNESIUM: Magnesium: 1.9 mg/dL (ref 1.7–2.4)

## 2022-09-15 MED ORDER — ONDANSETRON HCL 4 MG PO TABS: 4.0000 mg | ORAL_TABLET | Freq: Four times a day (QID) | ORAL | Status: DC | PRN

## 2022-09-15 MED ORDER — SODIUM CHLORIDE 0.9 % IV SOLN: INTRAVENOUS | Status: AC

## 2022-09-15 MED ORDER — ACETAMINOPHEN 325 MG PO TABS
650.0000 mg | ORAL_TABLET | ORAL | Status: DC | PRN
  Administered 2022-09-15: 650 mg via ORAL
  Filled 2022-09-15: qty 2

## 2022-09-15 MED ORDER — FENTANYL CITRATE PF 50 MCG/ML IJ SOSY: 12.5000 ug | PREFILLED_SYRINGE | INTRAMUSCULAR | Status: DC | PRN

## 2022-09-15 MED ORDER — FENTANYL CITRATE PF 50 MCG/ML IJ SOSY
50.0000 ug | PREFILLED_SYRINGE | Freq: Once | INTRAMUSCULAR | Status: AC
  Administered 2022-09-15: 50 ug via INTRAVENOUS
  Filled 2022-09-15: qty 1

## 2022-09-15 MED ORDER — SODIUM CHLORIDE 0.9% FLUSH
3.0000 mL | Freq: Two times a day (BID) | INTRAVENOUS | Status: DC
  Administered 2022-09-15 – 2022-09-16 (×2): 3 mL via INTRAVENOUS

## 2022-09-15 MED ORDER — SODIUM CHLORIDE 0.9 % IV SOLN
1.0000 g | Freq: Once | INTRAVENOUS | Status: AC
  Administered 2022-09-15: 1 g via INTRAVENOUS
  Filled 2022-09-15: qty 10

## 2022-09-15 MED ORDER — POTASSIUM CHLORIDE 10 MEQ/100ML IV SOLN
10.0000 meq | INTRAVENOUS | Status: AC
  Administered 2022-09-15 (×2): 10 meq via INTRAVENOUS
  Filled 2022-09-15 (×2): qty 100

## 2022-09-15 MED ORDER — POTASSIUM CHLORIDE CRYS ER 20 MEQ PO TBCR
20.0000 meq | EXTENDED_RELEASE_TABLET | Freq: Once | ORAL | Status: AC
  Administered 2022-09-15: 20 meq via ORAL
  Filled 2022-09-15: qty 1

## 2022-09-15 MED ORDER — DULOXETINE HCL 20 MG PO CPEP
40.0000 mg | ORAL_CAPSULE | Freq: Every day | ORAL | Status: DC
  Administered 2022-09-15: 40 mg via ORAL
  Filled 2022-09-15 (×3): qty 2

## 2022-09-15 MED ORDER — SODIUM CHLORIDE 0.9 % IV SOLN: INTRAVENOUS | Status: DC

## 2022-09-15 MED ORDER — TOPIRAMATE 25 MG PO TABS
50.0000 mg | ORAL_TABLET | Freq: Every day | ORAL | Status: DC
  Administered 2022-09-15 – 2022-09-16 (×2): 50 mg via ORAL
  Filled 2022-09-15 (×2): qty 2

## 2022-09-15 MED ORDER — ONDANSETRON HCL 4 MG/2ML IJ SOLN: 4.0000 mg | Freq: Four times a day (QID) | INTRAMUSCULAR | Status: DC | PRN

## 2022-09-15 MED ORDER — IOHEXOL 300 MG/ML  SOLN
100.0000 mL | Freq: Once | INTRAMUSCULAR | Status: AC | PRN
  Administered 2022-09-15: 100 mL via INTRAVENOUS

## 2022-09-15 MED ORDER — ONDANSETRON HCL 4 MG/2ML IJ SOLN
4.0000 mg | Freq: Once | INTRAMUSCULAR | Status: AC
  Administered 2022-09-15: 4 mg via INTRAVENOUS
  Filled 2022-09-15: qty 2

## 2022-09-15 MED ORDER — PANTOPRAZOLE SODIUM 40 MG PO TBEC
40.0000 mg | DELAYED_RELEASE_TABLET | Freq: Two times a day (BID) | ORAL | Status: DC
  Administered 2022-09-15 – 2022-09-16 (×3): 40 mg via ORAL
  Filled 2022-09-15 (×3): qty 1

## 2022-09-15 NOTE — ED Notes (Signed)
Pt is ambulatory to restroom w/out assistance. 

## 2022-09-15 NOTE — H&P (Signed)
History and Physical    Valerie [name] ZOX:[id number] DOB: 1950/11/08 DOA: 09/14/2022  PCP: Lance Bosch, NP   Patient coming from: home   Chief Complaint: Abdominal pain, N/V   HPI: Valerie [name] is a 72 y.o. female with medical history significant for hypertension, anxiety, and choledocholithiasis status post ERCP with sphincterotomy yesterday, now presenting with increased abdominal pain, nausea, and vomiting.  Patient reports that she has been experiencing abdominal pain and nausea for months but the symptoms have worsened recently, and then became severe yesterday afternoon following her EUS and ERCP with sphincterotomy.  Since then, the pain has been "10/10," localized to the epigastrium, and associated with nausea and nonbloody vomiting.  She has not attempted to eat anything due to her symptoms.    She denies fevers, chills, suprapubic pain, dysuria, or flank pain.  ED Course: Upon arrival to the ED, patient is found to be afebrile and saturating well on systolic blood pressure of 87 and greater.  Labs are notable for potassium 3.1, AST 241, ALT 107, normal bilirubin, normal lipase, and normal WBC.  CT demonstrates mild hyperdensity in the distal CBD, likely reflecting sludge.  ED physician sent a message to GI with request for routine consultation and treated the patient with Rocephin, fentanyl, and Zofran.  Review of Systems:  All other systems reviewed and apart from HPI, are negative.  Past Medical History:  Diagnosis Date   Acid reflux    Adenocarcinoma of the endometrium/uterus 12/04/2000   Anal fissure    Anxiety    Atrophic vaginitis    COVID-19 virus infection 09/28/2020   Difficult intubation    limited neck mobility   Esophagitis    Hernia    Present to proximal to umbillical    History of hiatal hernia    Hyperlipidemia    Hypertension    Internal hemorrhoids    Menorrhagia    Osteopenia    PONV (postoperative nausea and vomiting)    Recurrent UTI     Vertigo    Vitamin D deficiency     Past Surgical History:  Procedure Laterality Date   57 HOUR PH STUDY N/A 11/29/2015   Procedure: 24 HOUR PH STUDY;  Surgeon: Ruffin Frederick, MD;  Location: [facility];  Service: Gastroenterology;  Laterality: N/A;   ABDOMINAL HYSTERECTOMY  12/04/2000   also bilateral salpingo-oophorectomy   CARDIAC CATHETERIZATION  2006   CARPAL TUNNEL RELEASE  11/20/2011   Procedure: CARPAL TUNNEL RELEASE;  Surgeon: Mariam Dollar, MD;  Location: [facility];  Service: Neurosurgery;  Laterality: Left;  LEFT carpal tunnel release   CERVICAL SPINE SURGERY  2013   [facility]   CHOLECYSTECTOMY  1997   COLONOSCOPY WITH PROPOFOL N/A 07/27/2020   Procedure: COLONOSCOPY WITH PROPOFOL;  Surgeon: Midge Minium, MD;  Location: [facility] ENDOSCOPY;  Service: Endoscopy;  Laterality: N/A;   DILATION AND CURETTAGE OF UTERUS     11/15/2000   ESOPHAGEAL MANOMETRY N/A 09/20/2015   Procedure: ESOPHAGEAL MANOMETRY (EM);  Surgeon: Ruffin Frederick, MD;  Location: [facility];  Service: Gastroenterology;  Laterality: N/A;   ESOPHAGOGASTRODUODENOSCOPY (EGD) WITH PROPOFOL N/A 05/17/2021   Procedure: ESOPHAGOGASTRODUODENOSCOPY (EGD) WITH PROPOFOL;  Surgeon: Pasty Spillers, MD;  Location: [facility] ENDOSCOPY;  Service: Endoscopy;  Laterality: N/A;   ESOPHAGOGASTRODUODENOSCOPY (EGD) WITH PROPOFOL N/A 07/17/2022   Procedure: ESOPHAGOGASTRODUODENOSCOPY (EGD) WITH PROPOFOL;  Surgeon: Wyline Mood, MD;  Location: [facility] ENDOSCOPY;  Service: Gastroenterology;  Laterality: N/A;   HYSTEROSCOPY  11/15/2000   D & C,  resection of endometrial polyps   LUMBAR SPINAL CORD SIMULATOR LEAD REMOVAL N/A 08/01/2021   Procedure: REMOVAL OF SPINAL CORD STIMULATOR;  Surgeon: Venetia Night, MD;  Location: [facility] ORS;  Service: Neurosurgery;  Laterality: N/A;   OOPHORECTOMY  12/04/2000   bilateral salpingo-oophorectomy done with TAH   SPINAL CORD STIMULATOR INSERTION Bilateral 04/12/2020   Procedure: INSERTION CERVICAL  SPINAL STIMULATOR PULSE GENERATOR;  Surgeon: Lucy Chris, MD;  Location: [facility] ORS;  Service: Neurosurgery;  Laterality: Bilateral;   SPINAL CORD STIMULATOR TRIAL N/A 03/22/2020   Procedure: CERVICAL SPINAL CORD STIMULATOR TRIAL PERCUTANEOUS;  Surgeon: Lucy Chris, MD;  Location: [facility] ORS;  Service: Neurosurgery;  Laterality: N/A;   TUBAL LIGATION      Social History:   reports that she has never smoked. She has never been exposed to tobacco smoke. She has never used smokeless tobacco. She reports that she does not drink alcohol and does not use drugs.  Allergies  Allergen Reactions   Bactrim [Sulfamethoxazole-Trimethoprim] Other (See Comments)    Mouth ulcers and sores    Family History  Problem Relation Age of Onset   Hypertension Father    Prostate cancer Father    Hypertension Sister    Lung cancer Sister    Diabetes Brother    Heart disease Brother    Hypertension Brother    Diabetes Brother    Heart disease Brother    Breast cancer Neg Hx      Prior to Admission medications   Medication Sig Start Date End Date Taking? Authorizing Provider  acetaminophen (TYLENOL) 325 MG tablet Take 325 mg by mouth 2 (two) times daily as needed (pain).    [provider]  acetaminophen (TYLENOL) 500 MG tablet Take 500-1,000 mg by mouth every 6 (six) hours as needed for moderate pain.    [provider]  amLODipine (NORVASC) 5 MG tablet TAKE 1 TABLET EVERY DAY FOR BLOOD PRESSURE 08/30/22   Doreene Nest, NP  aspirin EC 81 MG tablet Take 81 mg by mouth daily. Swallow whole.    [provider]  atorvastatin (LIPITOR) 10 MG tablet Take 1 tablet (10 mg total) by mouth daily. for cholesterol. 10/09/21 10/09/22  Doreene Nest, NP  azelastine (ASTELIN) 0.1 % nasal spray Place 1 spray into both nostrils 2 (two) times daily. Use in each nostril as directed 05/16/22   Birder Robson, MD  CALCIUM-VITAMIN D PO Take 2 tablets by mouth daily.    [provider]   cetirizine (ZYRTEC) 10 MG tablet Take 1 tablet (10 mg total) by mouth daily as needed for allergies. 05/16/22   Birder Robson, MD  DULoxetine (CYMBALTA) 20 MG capsule Take 2 capsules (40 mg total) by mouth daily. For anxiety/depression/pain 06/27/21   Doreene Nest, NP  EPINEPHrine 0.3 mg/0.3 mL IJ SOAJ injection Inject 0.3 mg into the muscle as directed. 03/14/22   Birder Robson, MD  fluticasone (FLONASE) 50 MCG/ACT nasal spray USE 2 SPRAYS IN EACH NOSTRIL EVERY DAY Patient taking differently: Place 2 sprays into both nostrils. 08/30/22   Doreene Nest, NP  metoprolol succinate (TOPROL-XL) 50 MG 24 hr tablet TAKE 1 TABLET DAILY FOR HEART RATE/PALPITATIONS 09/03/22   Doreene Nest, NP  mirabegron ER (MYRBETRIQ) 25 MG TB24 tablet Take 1 tablet (25 mg total) by mouth daily. 03/16/22   Sondra Come, MD  Olopatadine HCl 0.2 % SOLN Apply 1 drop to eye daily as needed (itchy watery eyes). 03/14/22   Alesia Morin  P, MD  omeprazole (PRILOSEC) 40 MG capsule TAKE 1 CAPSULE TWICE DAILY FOR HEARTBURN 09/03/22   Doreene Nest, NP  topiramate (TOPAMAX) 50 MG tablet Take 1 tablet (50 mg total) by mouth daily. For headache prevention 04/04/22   Doreene Nest, NP  valsartan (DIOVAN) 160 MG tablet Take 160 mg by mouth daily.    [provider]    Physical Exam: Vitals:   09/15/22 0222 09/15/22 0223 09/15/22 0230 09/15/22 0245  BP: (!) 97/51   104/61  Pulse: 74 75 74 63  Resp: 18     Temp: 97.8 F (36.6 C)     TempSrc: Oral     SpO2: 95% 93% 95% 96%  Weight:      Height:        Constitutional: NAD, calm  Eyes: PERTLA, lids and conjunctivae normal ENMT: Mucous membranes are moist. Posterior pharynx clear of any exudate or lesions.   Neck: supple, no masses  Respiratory: no wheezing, no crackles. No accessory muscle use.  Cardiovascular: S1 & S2 heard, regular rate and rhythm. No extremity edema.   Abdomen: No distension, soft, tender in upper abdomen without peritoneal  signs. Bowel sounds active.  Musculoskeletal: no clubbing / cyanosis. No joint deformity upper and lower extremities.   Skin: no significant rashes, lesions, ulcers. Warm, dry, well-perfused. Neurologic: CN 2-12 grossly intact. Moving all extremities. Alert and oriented.  Psychiatric: Pleasant. Cooperative.    Labs and Imaging on Admission: I have personally reviewed following labs and imaging studies  CBC: Recent Labs  Lab 09/14/22 2340  WBC 6.7  HGB 11.4*  HCT 34.1*  MCV 96.9  PLT 178   Basic Metabolic Panel: Recent Labs  Lab 09/14/22 2340  NA 136  K 3.1*  CL 104  CO2 23  GLUCOSE 149*  BUN 18  CREATININE 1.04*  CALCIUM 9.1   GFR: Estimated Creatinine Clearance: 43.5 mL/min (A) (by C-G formula based on SCr of 1.04 mg/dL (H)). Liver Function Tests: Recent Labs  Lab 09/14/22 2340  AST 241*  ALT 107*  ALKPHOS 75  BILITOT 1.2  PROT 7.0  ALBUMIN 3.6   Recent Labs  Lab 09/14/22 2340  LIPASE 50   No results for input(s): "AMMONIA" in the last 168 hours. Coagulation Profile: No results for input(s): "INR", "PROTIME" in the last 168 hours. Cardiac Enzymes: No results for input(s): "CKTOTAL", "CKMB", "CKMBINDEX", "TROPONINI" in the last 168 hours. BNP (last 3 results) No results for input(s): "PROBNP" in the last 8760 hours. HbA1C: No results for input(s): "HGBA1C" in the last 72 hours. CBG: No results for input(s): "GLUCAP" in the last 168 hours. Lipid Profile: No results for input(s): "CHOL", "HDL", "LDLCALC", "TRIG", "CHOLHDL", "LDLDIRECT" in the last 72 hours. Thyroid Function Tests: No results for input(s): "TSH", "T4TOTAL", "FREET4", "T3FREE", "THYROIDAB" in the last 72 hours. Anemia Panel: No results for input(s): "VITAMINB12", "FOLATE", "FERRITIN", "TIBC", "IRON", "RETICCTPCT" in the last 72 hours. Urine analysis:    Component Value Date/Time   COLORURINE AMBER (A) 09/14/2022 2303   APPEARANCEUR CLOUDY (A) 09/14/2022 2303   APPEARANCEUR Cloudy  (A) 05/12/2022 1050   LABSPEC 1.025 09/14/2022 2303   PHURINE 5.0 09/14/2022 2303   GLUCOSEU NEGATIVE 09/14/2022 2303   HGBUR SMALL (A) 09/14/2022 2303   BILIRUBINUR NEGATIVE 09/14/2022 2303   BILIRUBINUR Negative 05/12/2022 1050   KETONESUR NEGATIVE 09/14/2022 2303   PROTEINUR 30 (A) 09/14/2022 2303   UROBILINOGEN 0.2 12/08/2020 1036   NITRITE NEGATIVE 09/14/2022 2303   LEUKOCYTESUR SMALL (A)  09/14/2022 2303   Sepsis Labs: (procalcitonin:4,lacticidven:4) )No results found for this or any previous visit (from the past 240 hour(s)).   Radiological Exams on Admission: CT ABDOMEN PELVIS W CONTRAST  Result Date: 09/15/2022 CLINICAL DATA:  Abdominal pain status post ERCP on 04/18. History of endometrial cancer. EXAM: CT ABDOMEN AND PELVIS WITH CONTRAST TECHNIQUE: Multidetector CT imaging of the abdomen and pelvis was performed using the standard protocol following bolus administration of intravenous contrast. RADIATION DOSE REDUCTION: This exam was performed according to the departmental dose-optimization program which includes automated exposure control, adjustment of the mA and/or kV according to patient size and/or use of iterative reconstruction technique. CONTRAST:  OMNIPAQUE IOHEXOL 300 MG/ML  SOLN COMPARISON:  MRI abdomen dated 07/25/2022. CT abdomen/pelvis dated 07/11/2022. FINDINGS: Lower chest: Mild dependent atelectasis in the lung bases. Hepatobiliary: Liver is within normal limits. Status post cholecystectomy. Mild central intrahepatic prominence. Common duct measures 10 mm but smoothly tapers at the ampulla (coronal image 36). There is mild hyperdensity in the distal CBD (series 2/image 26), likely reflecting sludge in the setting of recent ERCP. Pancreas: Within normal limits. Spleen: Within normal limits. Adrenals/Urinary Tract: Adrenal glands are within normal limits. Kidneys are within normal limits.  No hydronephrosis. Bladder is underdistended but unremarkable.  Stomach/Bowel: Stomach is within normal limits. No evidence of bowel obstruction. Prior appendectomy. No colonic wall thickening or inflammatory changes. Vascular/Lymphatic: No evidence of abdominal aortic aneurysm. Atherosclerotic calcifications of the abdominal aorta and branch vessels. No suspicious abdominopelvic lymphadenopathy. Reproductive: Status post hysterectomy. No adnexal masses. Other: No abdominopelvic ascites. Musculoskeletal: Degenerative changes of the visualized thoracolumbar spine. IMPRESSION: Mild hyperdensity in the distal CBD, likely reflecting sludge in the setting of recent ERCP. Additional stable ancillary findings as above. Electronically Signed   By: Charline Bills M.D.   On: 09/15/2022 01:43   DG ERCP  Result Date: 09/14/2022 CLINICAL DATA:  ERCP.  Concern for choledocholithiasis. EXAM: ERCP TECHNIQUE: Multiple spot images obtained with the fluoroscopic device and submitted for interpretation post-procedure. COMPARISON:  MRCP-07/18/2022; CT of the chest, abdomen pelvis-07/21/2022 FINDINGS: Multiple spot fluoroscopic images of the right upper abdominal quadrant during ERCP are provided for review Initial image demonstrates an ERCP probe overlying the right upper abdominal quadrant. Cholecystectomy clips overlies expected location of the gallbladder fossa Subsequent images demonstrate selective cannulation and opacification of the common bile duct. There are potential nonocclusive filling defects within the CBD seen on image 2). Subsequent images demonstrate insufflation of a balloon within the central aspect of the CBD with subsequent biliary sweeping, presumed stone extraction and sphincterotomy. There is minimal opacification of the central aspect of the intrahepatic biliary tree which appears nondilated. There is minimal opacification of a cystic duct remnant which is noted to have a low insertion into the CBD. Reflux of contrast into the cystic duct remnant demonstrates an  apparent occlusive filling defect, potentially representative of an air bubble though conceivably choledocholithiasis could have a similar appearance. There is no definitive opacification of the pancreatic duct. IMPRESSION: 1. ERCP with biliary sweeping, presumed stone extraction and sphincterotomy as above. 2. Reflux of contrast into a caudally inserted cystic duct remnant with apparent occlusive filling defect within the cystic duct, potentially representative of an air bubble, though conceivably choledocholithiasis within the cystic duct remnant could have a similar appearance. Correlation with the operative report is advised. These images were submitted for radiologic interpretation only. Please see the procedural report for the amount of contrast and the fluoroscopy time utilized. Electronically Signed  By: Simonne Come M.D.   On: 09/14/2022 13:59     Assessment/Plan   1. Intractable abdominal pain; elevated transaminases; ?CBD sludge  - Presents with increased abdominal pain after EUS and ERCP with sphincterotomy and sweeping of biliary tree  - CT concerning for residual sludge in distal CBD; transaminases now elevated (?expected after ERCP) - Continue bowel rest and pain-control, trend LFTs, follow-up GI recommendations    2. Hypertension  - She was hypotensive in ED prior to receiving IVF  - Hold antihypertensives for now    3. Hypokalemia  - Replace potassium, repeat chem panel     4. Anxiety  - Continue Cymbalta    5. Chronic headache  - Continue topiramate    DVT prophylaxis: SCDs  Code Status: Full  Level of Care: Level of care: Progressive Family Communication: husband at bedside   Disposition Plan:  Patient is from: Home  Anticipated d/c is to: TBD Anticipated d/c date is: Possibly as early as 4/20 or 09/17/22 Patient currently: Pending pain-control, GI consultation  Consults called: ED physician messaged [facility] with request for routine consult  Admission status:  Observation     Briscoe Deutscher, MD [facility]  09/15/2022, 3:52 AM

## 2022-09-15 NOTE — ED Provider Notes (Signed)
Valerie [name]   CSN: [id number] Arrival date & time: 09/14/22  2134     History  Chief Complaint  Patient presents with   Abdominal Pain    Valerie [name] is a 72 y.o. female.  The history is provided by the patient.  Abdominal Pain Pain location:  LUQ, RUQ and periumbilical Pain radiates to:  Does not radiate Pain severity:  Severe Onset quality:  Gradual Duration:  2 days Timing:  Constant Progression:  Worsening Chronicity:  New Context: not trauma   Context comment:  Had EGD earlier in the day Relieved by:  Nothing Worsened by:  Nothing Ineffective treatments:  None tried Associated symptoms: no dysuria, no fever, no shortness of breath and no vomiting   Risk factors: no alcohol abuse   Patient with h/o esophagitis and GERD presents with abdominal pain that started pre EGD and then worsened after her EGD earlier in the day by Dr. Meridee Score.  She did not call GI to inform them of the worsening pain.  No diarrhea.  Passing gas.  No fevers.      Past Medical History:  Diagnosis Date   Acid reflux    Adenocarcinoma of the endometrium/uterus 12/04/2000   Anal fissure    Anxiety    Atrophic vaginitis    COVID-19 virus infection 09/28/2020   Difficult intubation    limited neck mobility   Esophagitis    Hernia    Present to proximal to umbillical    History of hiatal hernia    Hyperlipidemia    Hypertension    Internal hemorrhoids    Menorrhagia    Osteopenia    PONV (postoperative nausea and vomiting)    Recurrent UTI    Vertigo    Vitamin D deficiency      Home Medications Prior to Admission medications   Medication Sig Start Date End Date Taking? Authorizing Provider  acetaminophen (TYLENOL) 325 MG tablet Take 325 mg by mouth 2 (two) times daily as needed (pain).    [provider]  acetaminophen (TYLENOL) 500 MG tablet Take 500-1,000 mg by mouth every 6 (six) hours as needed for  moderate pain.    [provider]  amLODipine (NORVASC) 5 MG tablet TAKE 1 TABLET EVERY DAY FOR BLOOD PRESSURE 08/30/22   Doreene Nest, NP  aspirin EC 81 MG tablet Take 81 mg by mouth daily. Swallow whole.    [provider]  atorvastatin (LIPITOR) 10 MG tablet Take 1 tablet (10 mg total) by mouth daily. for cholesterol. 10/09/21 10/09/22  Doreene Nest, NP  azelastine (ASTELIN) 0.1 % nasal spray Place 1 spray into both nostrils 2 (two) times daily. Use in each nostril as directed 05/16/22   Birder Robson, MD  CALCIUM-VITAMIN D PO Take 2 tablets by mouth daily.    [provider]  cetirizine (ZYRTEC) 10 MG tablet Take 1 tablet (10 mg total) by mouth daily as needed for allergies. 05/16/22   Birder Robson, MD  DULoxetine (CYMBALTA) 20 MG capsule Take 2 capsules (40 mg total) by mouth daily. For anxiety/depression/pain 06/27/21   Doreene Nest, NP  EPINEPHrine 0.3 mg/0.3 mL IJ SOAJ injection Inject 0.3 mg into the muscle as directed. 03/14/22   Birder Robson, MD  fluticasone (FLONASE) 50 MCG/ACT nasal spray USE 2 SPRAYS IN EACH NOSTRIL EVERY DAY Patient taking differently: Place 2 sprays into both nostrils. 08/30/22   Doreene Nest, NP  metoprolol succinate (TOPROL-XL) 50 MG 24 hr tablet TAKE 1 TABLET DAILY FOR HEART RATE/PALPITATIONS 09/03/22   Doreene Nest, NP  mirabegron ER (MYRBETRIQ) 25 MG TB24 tablet Take 1 tablet (25 mg total) by mouth daily. 03/16/22   Sondra Come, MD  Olopatadine HCl 0.2 % SOLN Apply 1 drop to eye daily as needed (itchy watery eyes). 03/14/22   Birder Robson, MD  omeprazole (PRILOSEC) 40 MG capsule TAKE 1 CAPSULE TWICE DAILY FOR HEARTBURN 09/03/22   Doreene Nest, NP  topiramate (TOPAMAX) 50 MG tablet Take 1 tablet (50 mg total) by mouth daily. For headache prevention 04/04/22   Doreene Nest, NP  valsartan (DIOVAN) 160 MG tablet Take 160 mg by mouth daily.    [provider]      Allergies    Bactrim  [sulfamethoxazole-trimethoprim]    Review of Systems   Review of Systems  Constitutional:  Negative for fever.  HENT:  Negative for ear discharge.   Eyes:  Negative for redness.  Respiratory:  Negative for shortness of breath.   Gastrointestinal:  Positive for abdominal pain. Negative for vomiting.  Genitourinary:  Negative for dysuria and flank pain.  All other systems reviewed and are negative.   Physical Exam Updated Vital Signs BP 104/61   Pulse 63   Temp 97.8 F (36.6 C) (Oral)   Resp 18   Ht 5\' 4"  (1.626 m)   Wt 56.2 kg   SpO2 96%   BMI 21.27 kg/m  Physical Exam Vitals and nursing [name] reviewed.  Constitutional:      General: She is not in acute distress.    Appearance: Normal appearance. She is well-developed.  HENT:     Head: Normocephalic and atraumatic.     Nose: Nose normal.  Eyes:     Pupils: Pupils are equal, round, and reactive to light.  Cardiovascular:     Rate and Rhythm: Normal rate and regular rhythm.     Pulses: Normal pulses.     Heart sounds: Normal heart sounds.  Pulmonary:     Effort: Pulmonary effort is normal. No respiratory distress.     Breath sounds: Normal breath sounds.  Abdominal:     General: Bowel sounds are normal. There is no distension.     Palpations: Abdomen is soft. There is no mass.     Tenderness: There is no abdominal tenderness. There is no guarding or rebound.     Hernia: No hernia is present.  Genitourinary:    Vagina: No vaginal discharge.  Musculoskeletal:        General: Normal range of motion.     Cervical back: Neck supple.  Skin:    General: Skin is warm and dry.     Capillary Refill: Capillary refill takes less than 2 seconds.     Findings: No erythema or rash.  Neurological:     General: No focal deficit present.     Mental Status: She is alert and oriented to person, place, and time.     Deep Tendon Reflexes: Reflexes normal.  Psychiatric:        Mood and Affect: Mood normal.        Behavior: Behavior  normal.     ED Results / Procedures / Treatments   Labs (all labs ordered are listed, but only abnormal results are displayed) Results for orders placed or performed during the hospital encounter of 09/14/22  Lipase, blood  Result Value Ref Range   Lipase 50 11 -  51 U/L  Comprehensive metabolic panel  Result Value Ref Range   Sodium 136 135 - 145 mmol/L   Potassium 3.1 (L) 3.5 - 5.1 mmol/L   Chloride 104 98 - 111 mmol/L   CO2 23 22 - 32 mmol/L   Glucose, Bld 149 (H) 70 - 99 mg/dL   BUN 18 8 - 23 mg/dL   Creatinine, Ser 1.61 (H) 0.44 - 1.00 mg/dL   Calcium 9.1 8.9 - 09.6 mg/dL   Total Protein 7.0 6.5 - 8.1 g/dL   Albumin 3.6 3.5 - 5.0 g/dL   AST 045 (H) 15 - 41 U/L   ALT 107 (H) 0 - 44 U/L   Alkaline Phosphatase 75 38 - 126 U/L   Total Bilirubin 1.2 0.3 - 1.2 mg/dL   GFR, Estimated 58 (L) >60 mL/min   Anion gap 9 5 - 15  CBC  Result Value Ref Range   WBC 6.7 4.0 - 10.5 K/uL   RBC 3.52 (L) 3.87 - 5.11 MIL/uL   Hemoglobin 11.4 (L) 12.0 - 15.0 g/dL   HCT 40.9 (L) 81.1 - 91.4 %   MCV 96.9 80.0 - 100.0 fL   MCH 32.4 26.0 - 34.0 pg   MCHC 33.4 30.0 - 36.0 g/dL   RDW 78.2 95.6 - 21.3 %   Platelets 178 150 - 400 K/uL   nRBC 0.0 0.0 - 0.2 %  Urinalysis, Routine w reflex microscopic -Urine, Clean Catch  Result Value Ref Range   Color, Urine AMBER (A) YELLOW   APPearance CLOUDY (A) CLEAR   Specific Gravity, Urine 1.025 1.005 - 1.030   pH 5.0 5.0 - 8.0   Glucose, UA NEGATIVE NEGATIVE mg/dL   Hgb urine dipstick SMALL (A) NEGATIVE   Bilirubin Urine NEGATIVE NEGATIVE   Ketones, ur NEGATIVE NEGATIVE mg/dL   Protein, ur 30 (A) NEGATIVE mg/dL   Nitrite NEGATIVE NEGATIVE   Leukocytes,Ua SMALL (A) NEGATIVE   RBC / HPF 21-50 0 - 5 RBC/hpf   WBC, UA >50 0 - 5 WBC/hpf   Bacteria, UA MANY (A) NONE SEEN   Squamous Epithelial / HPF 0-5 0 - 5 /HPF   Mucus PRESENT    Hyaline Casts, UA PRESENT    Ca Oxalate Crys, UA PRESENT    CT ABDOMEN PELVIS W CONTRAST  Result Date:  09/15/2022 CLINICAL DATA:  Abdominal pain status post ERCP on 04/18. History of endometrial cancer. EXAM: CT ABDOMEN AND PELVIS WITH CONTRAST TECHNIQUE: Multidetector CT imaging of the abdomen and pelvis was performed using the standard protocol following bolus administration of intravenous contrast. RADIATION DOSE REDUCTION: This exam was performed according to the departmental dose-optimization program which includes automated exposure control, adjustment of the mA and/or kV according to patient size and/or use of iterative reconstruction technique. CONTRAST:  OMNIPAQUE IOHEXOL 300 MG/ML  SOLN COMPARISON:  MRI abdomen dated 07/25/2022. CT abdomen/pelvis dated 07/11/2022. FINDINGS: Lower chest: Mild dependent atelectasis in the lung bases. Hepatobiliary: Liver is within normal limits. Status post cholecystectomy. Mild central intrahepatic prominence. Common duct measures 10 mm but smoothly tapers at the ampulla (coronal image 36). There is mild hyperdensity in the distal CBD (series 2/image 26), likely reflecting sludge in the setting of recent ERCP. Pancreas: Within normal limits. Spleen: Within normal limits. Adrenals/Urinary Tract: Adrenal glands are within normal limits. Kidneys are within normal limits.  No hydronephrosis. Bladder is underdistended but unremarkable. Stomach/Bowel: Stomach is within normal limits. No evidence of bowel obstruction. Prior appendectomy. No colonic wall thickening or inflammatory changes.  Vascular/Lymphatic: No evidence of abdominal aortic aneurysm. Atherosclerotic calcifications of the abdominal aorta and branch vessels. No suspicious abdominopelvic lymphadenopathy. Reproductive: Status post hysterectomy. No adnexal masses. Other: No abdominopelvic ascites. Musculoskeletal: Degenerative changes of the visualized thoracolumbar spine. IMPRESSION: Mild hyperdensity in the distal CBD, likely reflecting sludge in the setting of recent ERCP. Additional stable ancillary findings as  above. Electronically Signed   By: Charline Bills M.D.   On: 09/15/2022 01:43   DG ERCP  Result Date: 09/14/2022 CLINICAL DATA:  ERCP.  Concern for choledocholithiasis. EXAM: ERCP TECHNIQUE: Multiple spot images obtained with the fluoroscopic device and submitted for interpretation post-procedure. COMPARISON:  MRCP-07/18/2022; CT of the chest, abdomen pelvis-07/21/2022 FINDINGS: Multiple spot fluoroscopic images of the right upper abdominal quadrant during ERCP are provided for review Initial image demonstrates an ERCP probe overlying the right upper abdominal quadrant. Cholecystectomy clips overlies expected location of the gallbladder fossa Subsequent images demonstrate selective cannulation and opacification of the common bile duct. There are potential nonocclusive filling defects within the CBD seen on image 2). Subsequent images demonstrate insufflation of a balloon within the central aspect of the CBD with subsequent biliary sweeping, presumed stone extraction and sphincterotomy. There is minimal opacification of the central aspect of the intrahepatic biliary tree which appears nondilated. There is minimal opacification of a cystic duct remnant which is noted to have a low insertion into the CBD. Reflux of contrast into the cystic duct remnant demonstrates an apparent occlusive filling defect, potentially representative of an air bubble though conceivably choledocholithiasis could have a similar appearance. There is no definitive opacification of the pancreatic duct. IMPRESSION: 1. ERCP with biliary sweeping, presumed stone extraction and sphincterotomy as above. 2. Reflux of contrast into a caudally inserted cystic duct remnant with apparent occlusive filling defect within the cystic duct, potentially representative of an air bubble, though conceivably choledocholithiasis within the cystic duct remnant could have a similar appearance. Correlation with the operative report is advised. These images were  submitted for radiologic interpretation only. Please see the procedural report for the amount of contrast and the fluoroscopy time utilized. Electronically Signed   By: Simonne Come M.D.   On: 09/14/2022 13:59    Radiology CT ABDOMEN PELVIS W CONTRAST  Result Date: 09/15/2022 CLINICAL DATA:  Abdominal pain status post ERCP on 04/18. History of endometrial cancer. EXAM: CT ABDOMEN AND PELVIS WITH CONTRAST TECHNIQUE: Multidetector CT imaging of the abdomen and pelvis was performed using the standard protocol following bolus administration of intravenous contrast. RADIATION DOSE REDUCTION: This exam was performed according to the departmental dose-optimization program which includes automated exposure control, adjustment of the mA and/or kV according to patient size and/or use of iterative reconstruction technique. CONTRAST:  OMNIPAQUE IOHEXOL 300 MG/ML  SOLN COMPARISON:  MRI abdomen dated 07/25/2022. CT abdomen/pelvis dated 07/11/2022. FINDINGS: Lower chest: Mild dependent atelectasis in the lung bases. Hepatobiliary: Liver is within normal limits. Status post cholecystectomy. Mild central intrahepatic prominence. Common duct measures 10 mm but smoothly tapers at the ampulla (coronal image 36). There is mild hyperdensity in the distal CBD (series 2/image 26), likely reflecting sludge in the setting of recent ERCP. Pancreas: Within normal limits. Spleen: Within normal limits. Adrenals/Urinary Tract: Adrenal glands are within normal limits. Kidneys are within normal limits.  No hydronephrosis. Bladder is underdistended but unremarkable. Stomach/Bowel: Stomach is within normal limits. No evidence of bowel obstruction. Prior appendectomy. No colonic wall thickening or inflammatory changes. Vascular/Lymphatic: No evidence of abdominal aortic aneurysm. Atherosclerotic calcifications of the abdominal aorta  and branch vessels. No suspicious abdominopelvic lymphadenopathy. Reproductive: Status post hysterectomy. No  adnexal masses. Other: No abdominopelvic ascites. Musculoskeletal: Degenerative changes of the visualized thoracolumbar spine. IMPRESSION: Mild hyperdensity in the distal CBD, likely reflecting sludge in the setting of recent ERCP. Additional stable ancillary findings as above. Electronically Signed   By: Charline Bills M.D.   On: 09/15/2022 01:43   DG ERCP  Result Date: 09/14/2022 CLINICAL DATA:  ERCP.  Concern for choledocholithiasis. EXAM: ERCP TECHNIQUE: Multiple spot images obtained with the fluoroscopic device and submitted for interpretation post-procedure. COMPARISON:  MRCP-07/18/2022; CT of the chest, abdomen pelvis-07/21/2022 FINDINGS: Multiple spot fluoroscopic images of the right upper abdominal quadrant during ERCP are provided for review Initial image demonstrates an ERCP probe overlying the right upper abdominal quadrant. Cholecystectomy clips overlies expected location of the gallbladder fossa Subsequent images demonstrate selective cannulation and opacification of the common bile duct. There are potential nonocclusive filling defects within the CBD seen on image 2). Subsequent images demonstrate insufflation of a balloon within the central aspect of the CBD with subsequent biliary sweeping, presumed stone extraction and sphincterotomy. There is minimal opacification of the central aspect of the intrahepatic biliary tree which appears nondilated. There is minimal opacification of a cystic duct remnant which is noted to have a low insertion into the CBD. Reflux of contrast into the cystic duct remnant demonstrates an apparent occlusive filling defect, potentially representative of an air bubble though conceivably choledocholithiasis could have a similar appearance. There is no definitive opacification of the pancreatic duct. IMPRESSION: 1. ERCP with biliary sweeping, presumed stone extraction and sphincterotomy as above. 2. Reflux of contrast into a caudally inserted cystic duct remnant with  apparent occlusive filling defect within the cystic duct, potentially representative of an air bubble, though conceivably choledocholithiasis within the cystic duct remnant could have a similar appearance. Correlation with the operative report is advised. These images were submitted for radiologic interpretation only. Please see the procedural report for the amount of contrast and the fluoroscopy time utilized. Electronically Signed   By: Simonne Come M.D.   On: 09/14/2022 13:59    Procedures Procedures    Medications Ordered in ED Medications  fentaNYL (SUBLIMAZE) injection 50 mcg (50 mcg Intravenous Given 09/15/22 0034)  ondansetron (ZOFRAN) injection 4 mg (4 mg Intravenous Given 09/15/22 0034)  cefTRIAXone (ROCEPHIN) 1 g in sodium chloride 0.9 % 100 mL IVPB (0 g Intravenous Stopped 09/15/22 0255)  iohexol (OMNIPAQUE) 300 MG/ML solution 100 mL (100 mLs Intravenous Contrast Given 09/15/22 0110)    ED Course/ Medical Decision Making/ A&P                             Medical Decision Making Abdominal pain that started before EGD and is now worse.    Amount and/or Complexity of Data Reviewed Independent Historian: spouse    Details: See above  External Data Reviewed: notes.    Details: Previous notes reviewed  Labs: ordered.    Details: All labs reviewed: normal white count 6.7, normal platelet count, hemoglobin low 11.4. normal sodium 136, potassium slight low 3.1, creatinine 1.04, elevated AST and ALT post procedure.   Radiology: ordered.    Details: No obstruction on CT by me  Discussion of management or test interpretation with external provider(s): Dr. Leonides Schanz of GI messaged through secure chat about consult   Risk Prescription drug management. Decision regarding hospitalization.    Final Clinical Impression(s) / ED Diagnoses Final diagnoses:  Acute cystitis without hematuria  Elevated liver function tests  Biliary sludge   The patient appears reasonably stabilized for  admission considering the current resources, flow, and capabilities available in the ED at this time, and I doubt any other [facility] requiring further screening and/or treatment in the ED prior to admission.  Rx / DC Orders ED Discharge Orders     None         Dashan Chizmar, MD 09/15/22 0345

## 2022-09-15 NOTE — Consult Note (Addendum)
Consultation  Referring Provider:   [facility] Primary Care Physician:  Lance Bosch, NP Primary Gastroenterologist:  Dr. Tobi Bastos at [facility]       Reason for Consultation:  Post ERCP AB pain, N/V            HPI:   DEBAR PLATE is a 72 y.o. female with past medical history significant for hypertension, hyperlipidemia, GERD, anxiety, constipation with history of partial small bowel obstruction, choledocholithiasis status post ERCP with sphincterotomy yesterday with Dr. Meridee Score presents to ER with abdominal pain, nausea and vomiting.  Patient's primary GI is Dr. Tobi Bastos at [facility], was having nausea, abdominal pain had CT abdomen pelvis in February that showed thickening of gastric wall, prominence of CBD with distal tapering at the level of the ampulla. Subsequent MRCP showed signs of biliary duct dilation both intra and extrahepatic status post cholecystectomy favor small stones dependent distal common bile duct just above the ampulla.  07/17/2022 EGD unremarkable 09/14/2022 ERCP and EUS with Dr. Meridee Score, patient had no fevers, chills or elevation of LFTs at that time. ERCP showed major papula normal, biliary sphincterotomy was performed with biliary tree's swept and sludge found. An occlusion cholangiogram was performed that showed no further significant biliary pathology. Fluror guidance during ERCP showed Reflux of contrast into a caudally inserted cystic duct remnant with apparent occlusive filling defect within the cystic duct, potentially representative of an air bubble, though conceivably choledocholithiasis within the cystic duct remnant could have a similar appearance. Correlation with the operative report is advised. EGD showed 1 cm hiatal hernia, deformity and stomach patchy inflammation characterized by erosions normal duodenum major papula normal. EUS showed hyperechoic material consistent with sludge in the common bile duct no signs of significant pathology in pancreatic  head.  No family was present at the time of my evaluation. Patient states about 1 to 2 hours after ERCP yesterday began to have gradual increase worsening of epigastric abdominal pain radiated to back and right side.  Became a 10 out of 10.  She did have nausea and vomiting and does describe 1 episode with brown coffee emesis.  Patient also had 1 episode of black loose stools with fecal incontinence small-volume around 730 last night.  States her urine was darker last night and had some urinary incontinence as well.   Patient had chills but no fever.  In the ER yesterday patient mild hypotension 87/48 responded to IVF currently 102/50.  Afebrile otherwise stable HD.   She had elevation of AST to 41, ALT 107, alk phos 75, total bilirubin 1.2, lipase 50, pending repeat for today Hemoglobin 11.4, down from baseline of 13.  WBC 6.7, platelets 178    Abnormal ED labs: Abnormal Labs Reviewed  COMPREHENSIVE METABOLIC PANEL - Abnormal; Notable for the following components:      Result Value   Potassium 3.1 (*)    Glucose, Bld 149 (*)    Creatinine, Ser 1.04 (*)    AST 241 (*)    ALT 107 (*)    GFR, Estimated 58 (*)    All other components within normal limits  CBC - Abnormal; Notable for the following components:   RBC 3.52 (*)    Hemoglobin 11.4 (*)    HCT 34.1 (*)    All other components within normal limits  URINALYSIS, ROUTINE W REFLEX MICROSCOPIC - Abnormal; Notable for the following components:   Color, Urine AMBER (*)    APPearance CLOUDY (*)    Hgb  urine dipstick SMALL (*)    Protein, ur 30 (*)    Leukocytes,Ua SMALL (*)    Bacteria, UA MANY (*)    All other components within normal limits     Past Medical History:  Diagnosis Date   Acid reflux    Adenocarcinoma of the endometrium/uterus 12/04/2000   Anal fissure    Anxiety    Atrophic vaginitis    COVID-19 virus infection 09/28/2020   Difficult intubation    limited neck mobility   Esophagitis    Hernia    Present  to proximal to umbillical    History of hiatal hernia    Hyperlipidemia    Hypertension    Internal hemorrhoids    Menorrhagia    Osteopenia    PONV (postoperative nausea and vomiting)    Recurrent UTI    Vertigo    Vitamin D deficiency     Surgical History:  She  has a past surgical history that includes Tubal ligation; Cholecystectomy (1997); Hysteroscopy (11/15/2000); Dilation and curettage of uterus; Abdominal hysterectomy (12/04/2000); Oophorectomy (12/04/2000); Carpal tunnel release (11/20/2011); Cardiac catheterization (2006); Esophageal manometry (N/A, 09/20/2015); 24 hour ph study (N/A, 11/29/2015); Cervical spine surgery (2013); Spinal cord stimulator trial (N/A, 03/22/2020); Spinal cord stimulator insertion (Bilateral, 04/12/2020); Colonoscopy with propofol (N/A, 07/27/2020); Esophagogastroduodenoscopy (egd) with propofol (N/A, 05/17/2021); Lumbar spinal cord simulator lead removal (N/A, 08/01/2021); and Esophagogastroduodenoscopy (egd) with propofol (N/A, 07/17/2022). Family History:  Her family history includes Diabetes in her brother and brother; Heart disease in her brother and brother; Hypertension in her brother, father, and sister; Lung cancer in her sister; Prostate cancer in her father. Social History:   reports that she has never smoked. She has never been exposed to tobacco smoke. She has never used smokeless tobacco. She reports that she does not drink alcohol and does not use drugs.  Prior to Admission medications   Medication Sig Start Date End Date Taking? Authorizing Provider  acetaminophen (TYLENOL) 325 MG tablet Take 325 mg by mouth 2 (two) times daily as needed (pain).    [provider]  acetaminophen (TYLENOL) 500 MG tablet Take 500-1,000 mg by mouth every 6 (six) hours as needed for moderate pain.    [provider]  amLODipine (NORVASC) 5 MG tablet TAKE 1 TABLET EVERY DAY FOR BLOOD PRESSURE 08/30/22   Doreene Nest, NP  aspirin EC 81 MG tablet  Take 81 mg by mouth daily. Swallow whole.    [provider]  atorvastatin (LIPITOR) 10 MG tablet Take 1 tablet (10 mg total) by mouth daily. for cholesterol. 10/09/21 10/09/22  Doreene Nest, NP  azelastine (ASTELIN) 0.1 % nasal spray Place 1 spray into both nostrils 2 (two) times daily. Use in each nostril as directed 05/16/22   Birder Robson, MD  CALCIUM-VITAMIN D PO Take 2 tablets by mouth daily.    [provider]  cetirizine (ZYRTEC) 10 MG tablet Take 1 tablet (10 mg total) by mouth daily as needed for allergies. 05/16/22   Birder Robson, MD  DULoxetine (CYMBALTA) 20 MG capsule Take 2 capsules (40 mg total) by mouth daily. For anxiety/depression/pain 06/27/21   Doreene Nest, NP  EPINEPHrine 0.3 mg/0.3 mL IJ SOAJ injection Inject 0.3 mg into the muscle as directed. 03/14/22   Birder Robson, MD  fluticasone (FLONASE) 50 MCG/ACT nasal spray USE 2 SPRAYS IN EACH NOSTRIL EVERY DAY Patient taking differently: Place 2 sprays into both nostrils. 08/30/22   Doreene Nest, NP  metoprolol succinate (TOPROL-XL) 50 MG 24 hr tablet TAKE 1 TABLET DAILY FOR HEART RATE/PALPITATIONS 09/03/22   Doreene Nest, NP  mirabegron ER (MYRBETRIQ) 25 MG TB24 tablet Take 1 tablet (25 mg total) by mouth daily. 03/16/22   Sondra Come, MD  Olopatadine HCl 0.2 % SOLN Apply 1 drop to eye daily as needed (itchy watery eyes). 03/14/22   Birder Robson, MD  omeprazole (PRILOSEC) 40 MG capsule TAKE 1 CAPSULE TWICE DAILY FOR HEARTBURN 09/03/22   Doreene Nest, NP  topiramate (TOPAMAX) 50 MG tablet Take 1 tablet (50 mg total) by mouth daily. For headache prevention 04/04/22   Doreene Nest, NP  valsartan (DIOVAN) 160 MG tablet Take 160 mg by mouth daily.    [provider]    Current Facility-Administered Medications  Medication Dose Route Frequency Provider Last Rate Last Admin   0.9 %  sodium chloride infusion   Intravenous Continuous Opyd, Lavone Neri, MD 125 mL/hr at  09/15/22 0430 New Bag at 09/15/22 0430   DULoxetine (CYMBALTA) DR capsule 40 mg  40 mg Oral Daily Opyd, Lavone Neri, MD       fentaNYL (SUBLIMAZE) injection 12.5-25 mcg  12.5-25 mcg Intravenous Q2H PRN Opyd, Lavone Neri, MD       ondansetron (ZOFRAN) tablet 4 mg  4 mg Oral Q6H PRN Opyd, Lavone Neri, MD       Or   ondansetron (ZOFRAN) injection 4 mg  4 mg Intravenous Q6H PRN Opyd, Lavone Neri, MD       pantoprazole (PROTONIX) EC tablet 40 mg  40 mg Oral BID Opyd, Lavone Neri, MD       sodium chloride flush (NS) 0.9 % injection 3 mL  3 mL Intravenous Q12H Opyd, Lavone Neri, MD       topiramate (TOPAMAX) tablet 50 mg  50 mg Oral Daily Opyd, Lavone Neri, MD       Current Outpatient Medications  Medication Sig Dispense Refill   acetaminophen (TYLENOL) 325 MG tablet Take 325 mg by mouth 2 (two) times daily as needed (pain).     acetaminophen (TYLENOL) 500 MG tablet Take 500-1,000 mg by mouth every 6 (six) hours as needed for moderate pain.     amLODipine (NORVASC) 5 MG tablet TAKE 1 TABLET EVERY DAY FOR BLOOD PRESSURE 90 tablet 0   aspirin EC 81 MG tablet Take 81 mg by mouth daily. Swallow whole.     atorvastatin (LIPITOR) 10 MG tablet Take 1 tablet (10 mg total) by mouth daily. for cholesterol. 90 tablet 1   azelastine (ASTELIN) 0.1 % nasal spray Place 1 spray into both nostrils 2 (two) times daily. Use in each nostril as directed 30 mL 5   CALCIUM-VITAMIN D PO Take 2 tablets by mouth daily.     cetirizine (ZYRTEC) 10 MG tablet Take 1 tablet (10 mg total) by mouth daily as needed for allergies. 30 tablet 5   DULoxetine (CYMBALTA) 20 MG capsule Take 2 capsules (40 mg total) by mouth daily. For anxiety/depression/pain 180 capsule 0   EPINEPHrine 0.3 mg/0.3 mL IJ SOAJ injection Inject 0.3 mg into the muscle as directed. 2 each 1   fluticasone (FLONASE) 50 MCG/ACT nasal spray USE 2 SPRAYS IN EACH NOSTRIL EVERY DAY (Patient taking differently: Place 2 sprays into both nostrils.) 48 g 0   metoprolol succinate  (TOPROL-XL) 50 MG 24 hr tablet TAKE 1 TABLET DAILY FOR HEART RATE/PALPITATIONS 90 tablet 0   mirabegron ER (MYRBETRIQ) 25 MG TB24 tablet  Take 1 tablet (25 mg total) by mouth daily. 30 tablet 0   Olopatadine HCl 0.2 % SOLN Apply 1 drop to eye daily as needed (itchy watery eyes). 2.5 mL 2   omeprazole (PRILOSEC) 40 MG capsule TAKE 1 CAPSULE TWICE DAILY FOR HEARTBURN 180 capsule 0   topiramate (TOPAMAX) 50 MG tablet Take 1 tablet (50 mg total) by mouth daily. For headache prevention 90 tablet 2   valsartan (DIOVAN) 160 MG tablet Take 160 mg by mouth daily.      Allergies as of 09/14/2022 - Review Complete 09/14/2022  Allergen Reaction Noted   Bactrim [sulfamethoxazole-trimethoprim] Other (See Comments) 03/22/2022    Review of Systems:    Constitutional: No weight loss, fever, chills, weakness or fatigue HEENT: Eyes: No change in vision               Ears, Nose, Throat:  No change in hearing or congestion Skin: No rash or itching Cardiovascular: No chest pain, chest pressure or palpitations   Respiratory: No SOB or cough Gastrointestinal: See HPI and otherwise negative Genitourinary: No dysuria or change in urinary frequency Neurological: No headache, dizziness or syncope Musculoskeletal: No new muscle or joint pain Hematologic: No bleeding or bruising Psychiatric: No history of depression or anxiety     Physical Exam:  Vital signs in last 24 hours: Temp:  [97.5 F (36.4 C)-98.2 F (36.8 C)] 98.2 F (36.8 C) (04/19 0658) Pulse Rate:  [61-91] 63 (04/19 0245) Resp:  [15-24] 18 (04/19 0222) BP: (87-136)/(48-84) 104/61 (04/19 0245) SpO2:  [92 %-99 %] 96 % (04/19 0245) Weight:  [56.2 kg] 56.2 kg (04/18 2156)   Last BM recorded by nurses in past 5 days No data recorded  General:   Pleasant, well developed, elderly female in no acute distress Head:  Normocephalic and atraumatic. Eyes: sclerae anicteric,conjunctive pink  Heart:  regular rate and rhythm Pulm: Clear anteriorly; no  wheezing Abdomen:  Soft, Non-distended AB, Active bowel sounds. Mild to moderate tenderness diffusely in the abdomen, more so epigastric, no peritoneal signs no rebound.. , No organomegaly appreciated. Extremities:  Without edema. Msk:  Symmetrical without gross deformities. Peripheral pulses intact.  Neurologic:  Alert and  oriented x4;  No focal deficits.  Skin:   Dry and intact without significant lesions or rashes. Psychiatric:  Cooperative. Normal mood and affect.  LAB RESULTS: Recent Labs    09/14/22 2340  WBC 6.7  HGB 11.4*  HCT 34.1*  PLT 178   BMET Recent Labs    09/14/22 2340  NA 136  K 3.1*  CL 104  CO2 23  GLUCOSE 149*  BUN 18  CREATININE 1.04*  CALCIUM 9.1   LFT Recent Labs    09/14/22 2340  PROT 7.0  ALBUMIN 3.6  AST 241*  ALT 107*  ALKPHOS 75  BILITOT 1.2   PT/INR No results for input(s): "LABPROT", "INR" in the last 72 hours.  STUDIES: CT ABDOMEN PELVIS W CONTRAST  Result Date: 09/15/2022 CLINICAL DATA:  Abdominal pain status post ERCP on 04/18. History of endometrial cancer. EXAM: CT ABDOMEN AND PELVIS WITH CONTRAST TECHNIQUE: Multidetector CT imaging of the abdomen and pelvis was performed using the standard protocol following bolus administration of intravenous contrast. RADIATION DOSE REDUCTION: This exam was performed according to the departmental dose-optimization program which includes automated exposure control, adjustment of the mA and/or kV according to patient size and/or use of iterative reconstruction technique. CONTRAST:  OMNIPAQUE IOHEXOL 300 MG/ML  SOLN COMPARISON:  MRI abdomen dated  07/25/2022. CT abdomen/pelvis dated 07/11/2022. FINDINGS: Lower chest: Mild dependent atelectasis in the lung bases. Hepatobiliary: Liver is within normal limits. Status post cholecystectomy. Mild central intrahepatic prominence. Common duct measures 10 mm but smoothly tapers at the ampulla (coronal image 36). There is mild hyperdensity in the distal  CBD (series 2/image 26), likely reflecting sludge in the setting of recent ERCP. Pancreas: Within normal limits. Spleen: Within normal limits. Adrenals/Urinary Tract: Adrenal glands are within normal limits. Kidneys are within normal limits.  No hydronephrosis. Bladder is underdistended but unremarkable. Stomach/Bowel: Stomach is within normal limits. No evidence of bowel obstruction. Prior appendectomy. No colonic wall thickening or inflammatory changes. Vascular/Lymphatic: No evidence of abdominal aortic aneurysm. Atherosclerotic calcifications of the abdominal aorta and branch vessels. No suspicious abdominopelvic lymphadenopathy. Reproductive: Status post hysterectomy. No adnexal masses. Other: No abdominopelvic ascites. Musculoskeletal: Degenerative changes of the visualized thoracolumbar spine. IMPRESSION: Mild hyperdensity in the distal CBD, likely reflecting sludge in the setting of recent ERCP. Additional stable ancillary findings as above. Electronically Signed   By: Charline Bills M.D.   On: 09/15/2022 01:43   DG ERCP  Result Date: 09/14/2022 CLINICAL DATA:  ERCP.  Concern for choledocholithiasis. EXAM: ERCP TECHNIQUE: Multiple spot images obtained with the fluoroscopic device and submitted for interpretation post-procedure. COMPARISON:  MRCP-07/18/2022; CT of the chest, abdomen pelvis-07/21/2022 FINDINGS: Multiple spot fluoroscopic images of the right upper abdominal quadrant during ERCP are provided for review Initial image demonstrates an ERCP probe overlying the right upper abdominal quadrant. Cholecystectomy clips overlies expected location of the gallbladder fossa Subsequent images demonstrate selective cannulation and opacification of the common bile duct. There are potential nonocclusive filling defects within the CBD seen on image 2). Subsequent images demonstrate insufflation of a balloon within the central aspect of the CBD with subsequent biliary sweeping, presumed stone extraction and  sphincterotomy. There is minimal opacification of the central aspect of the intrahepatic biliary tree which appears nondilated. There is minimal opacification of a cystic duct remnant which is noted to have a low insertion into the CBD. Reflux of contrast into the cystic duct remnant demonstrates an apparent occlusive filling defect, potentially representative of an air bubble though conceivably choledocholithiasis could have a similar appearance. There is no definitive opacification of the pancreatic duct. IMPRESSION: 1. ERCP with biliary sweeping, presumed stone extraction and sphincterotomy as above. 2. Reflux of contrast into a caudally inserted cystic duct remnant with apparent occlusive filling defect within the cystic duct, potentially representative of an air bubble, though conceivably choledocholithiasis within the cystic duct remnant could have a similar appearance. Correlation with the operative report is advised. These images were submitted for radiologic interpretation only. Please see the procedural report for the amount of contrast and the fluoroscopy time utilized. Electronically Signed   By: Simonne Come M.D.   On: 09/14/2022 13:59       Impression/Plan   Abdominal pain with associated elevated LFTs status post EUS and ERCP 09/14/2022 with sphincterotomy AST 241 ALT 107  Alkphos 75 TBili 1.2 CT abdomen pelvis mild central intrahepatic prominent CBD 10 mm but smoothly tapers at the ampule mild hyperdensity in the distal CBD likely reflecting sludge in the setting of recent ERCP.  Normal pancreas normal spleen, no free air Lipase negative, mild elevation LFTs, with unremarkable alk phos normal total bilirubin in afebrile patient is reassuring, at this time will continue to trend LFTs and supportive care, no plan for endoscopic evaluation at this time -Monitor LFTs -Pain control per primary -Any worsening LFTs  or elevation of alk phos/bili consider repeat MRCP -Further recommendations per  our biliary specialist  Questionable melena/brown emesis HGB 11.4  EGD with erythema otherwise no ulcers, ERCP no post sphincterotomy bleeding H&H stable Continue PPI 40 mg twice daily Monitor CBC, transfuse to maintain H&H greater than 7  Hypotension Holding antihypertensives, IVF  Thank you for your kind consultation, we will continue to follow.  LOS: 0 days   Valerie [name]  09/15/2022, 8:04 AM  GI ATTENDING  History, laboratories, x-rays, yesterday's ERCP report all personally reviewed.  Patient personally seen and examined in the emergency room.  Her sister is at the bedside.  Agree with comprehensive consultation note as outlined above.  IMPRESSION: 1.  Significant abdominal pain post ERCP with sphincterotomy yesterday (with Dr. Meridee Score for biliary sludge) with elevated hepatic transaminases.  These issues are almost certainly secondary to ampullary edema post procedure with an element of obstruction associated increased intrabiliary pressure with pain and elevated liver tests.  She is feeling much better this afternoon.  Her abdominal exam is benign.  She looks well.  No evidence for other ERCP related complications such as pancreatitis or perforation.  Recommendations: 1.  Overnight observation  2.  Repeat liver test in a.m. 3.  Low-fat diet is okay 4.  If she is doing well in a.m., can be discharged.  If she were to have recurrent pain with worsening liver test, repeat ERCP might be indicated.  Discussed with the patient and her sister.  Dr. Elnoria Howard is covering for GI this weekend.  Wilhemina Bonito. Eda Keys., M.D. [facility] Division of Gastroenterology

## 2022-09-15 NOTE — Progress Notes (Addendum)
  PROGRESS NOTE    Valerie [name]  BJY:[id number] DOB: [birth year] DOA: 09/14/2022 PCP: Lance Bosch, NP   Brief Narrative:  Valerie [name] is a 72 y.o. female with medical history significant for hypertension, anxiety, and choledocholithiasis status post ERCP with sphincterotomy yesterday, now presenting with increased abdominal pain, nausea, and vomiting.   Patient admitted overnight for worsening abdominal pain nausea vomiting, recently had ERCP with sphincterotomy and recovered quite well but unfortunately had worsening symptoms as above over the past 24 hours.  Patient admitted this morning by overnight team, GI was consulted -appreciate insight and recommendations on further imaging or possible procedure.  Assessment & Plan:   Principal Problem:   Intractable abdominal pain Active Problems:   Essential hypertension   Depression   Choledocholithiasis   Hypokalemia  Intractable abdominal pain; elevated transaminases; ?CBD sludge    Hypertension    Hypokalemia    Anxiety   Chronic headache   General:  Pleasantly resting in bed, No acute distress. HEENT:  Normocephalic atraumatic.  Sclerae nonicteric, noninjected.  Extraocular movements intact bilaterally. Neck:  Without mass or deformity.  Trachea is midline. Lungs:  Clear to auscultate bilaterally without rhonchi, wheeze, or rales. Heart:  Regular rate and rhythm.  Without murmurs, rubs, or gallops. Abdomen:  Soft, tender to mid epigastrium without guarding or rebound. Extremities: Without cyanosis, clubbing, edema, or obvious deformity. Vascular:  Dorsalis pedis and posterior tibial pulses palpable bilaterally. Skin:  Warm and dry, no erythema, no ulcerations.    Time spent:    Azucena Fallen, DO [facility]  If 7PM-7AM, please contact night-coverage [email or web address]  09/15/2022, 8:03 AM

## 2022-09-16 DIAGNOSIS — R109 Unspecified abdominal pain: Secondary | ICD-10-CM | POA: Diagnosis not present

## 2022-09-16 LAB — CBC
HCT: 30.2 % — ABNORMAL LOW (ref 36.0–46.0)
Hemoglobin: 9.8 g/dL — ABNORMAL LOW (ref 12.0–15.0)
MCH: 32.6 pg (ref 26.0–34.0)
MCHC: 32.5 g/dL (ref 30.0–36.0)
MCV: 100.3 fL — ABNORMAL HIGH (ref 80.0–100.0)
Platelets: 143 10*3/uL — ABNORMAL LOW (ref 150–400)
RBC: 3.01 MIL/uL — ABNORMAL LOW (ref 3.87–5.11)
RDW: 14.1 % (ref 11.5–15.5)
WBC: 6.6 10*3/uL (ref 4.0–10.5)
nRBC: 0 % (ref 0.0–0.2)

## 2022-09-16 LAB — COMPREHENSIVE METABOLIC PANEL
ALT: 120 U/L — ABNORMAL HIGH (ref 0–44)
AST: 100 U/L — ABNORMAL HIGH (ref 15–41)
Albumin: 3.2 g/dL — ABNORMAL LOW (ref 3.5–5.0)
Alkaline Phosphatase: 62 U/L (ref 38–126)
Anion gap: 5 (ref 5–15)
BUN: 13 mg/dL (ref 8–23)
CO2: 25 mmol/L (ref 22–32)
Calcium: 8.3 mg/dL — ABNORMAL LOW (ref 8.9–10.3)
Chloride: 108 mmol/L (ref 98–111)
Creatinine, Ser: 0.91 mg/dL (ref 0.44–1.00)
GFR, Estimated: >60 mL/min (ref >60)
Glucose, Bld: 92 mg/dL (ref 70–99)
Potassium: 3.6 mmol/L (ref 3.5–5.1)
Sodium: 138 mmol/L (ref 135–145)
Total Bilirubin: 1 mg/dL (ref 0.3–1.2)
Total Protein: 5.9 g/dL — ABNORMAL LOW (ref 6.5–8.1)

## 2022-09-16 LAB — URINE CULTURE: Special Requests: NORMAL

## 2022-09-16 MED ORDER — ATORVASTATIN CALCIUM 10 MG PO TABS: 10.0000 mg | ORAL_TABLET | Freq: Every day | ORAL | 0 refills | Status: DC

## 2022-09-16 NOTE — Progress Notes (Signed)
Subjective: No abdominal pain.  She reports feeling well.  Objective: Vital signs in last 24 hours: Temp:  [97.4 F (36.3 C)-98.7 F (37.1 C)] 98.2 F (36.8 C) (04/20 0418) Pulse Rate:  [60-71] 70 (04/20 0418) Resp:  [16-18] 18 (04/20 0418) BP: (101-119)/(50-73) 114/73 (04/20 0418) SpO2:  [95 %-99 %] 96 % (04/20 0418) Last BM Date : 09/15/22  Intake/Output from previous day: 04/19 0701 - 04/20 0700 In: 913.9 [P.O.:120; I.V.:793.9] Out: 50 [Urine:50] Intake/Output this shift: No intake/output data recorded.  General appearance: alert and no distress GI: soft, non-tender; bowel sounds normal; no masses,  no organomegaly  Lab Results: Recent Labs    09/14/22 2340  WBC 6.7  HGB 11.4*  HCT 34.1*  PLT 178   BMET Recent Labs    09/14/22 2340  NA 136  K 3.1*  CL 104  CO2 23  GLUCOSE 149*  BUN 18  CREATININE 1.04*  CALCIUM 9.1   LFT Recent Labs    09/14/22 2340  PROT 7.0  ALBUMIN 3.6  AST 241*  ALT 107*  ALKPHOS 75  BILITOT 1.2   PT/INR No results for input(s): "LABPROT", "INR" in the last 72 hours. Hepatitis Panel No results for input(s): "HEPBSAG", "HCVAB", "HEPAIGM", "HEPBIGM" in the last 72 hours. C-Diff No results for input(s): "CDIFFTOX" in the last 72 hours. Fecal Lactopherrin No results for input(s): "FECLLACTOFRN" in the last 72 hours.  Studies/Results: CT ABDOMEN PELVIS W CONTRAST  Result Date: 09/15/2022 CLINICAL DATA:  Abdominal pain status post ERCP on 04/18. History of endometrial cancer. EXAM: CT ABDOMEN AND PELVIS WITH CONTRAST TECHNIQUE: Multidetector CT imaging of the abdomen and pelvis was performed using the standard protocol following bolus administration of intravenous contrast. RADIATION DOSE REDUCTION: This exam was performed according to the departmental dose-optimization program which includes automated exposure control, adjustment of the mA and/or kV according to patient size and/or use of iterative reconstruction technique.  CONTRAST:  OMNIPAQUE IOHEXOL 300 MG/ML  SOLN COMPARISON:  MRI abdomen dated 07/25/2022. CT abdomen/pelvis dated 07/11/2022. FINDINGS: Lower chest: Mild dependent atelectasis in the lung bases. Hepatobiliary: Liver is within normal limits. Status post cholecystectomy. Mild central intrahepatic prominence. Common duct measures 10 mm but smoothly tapers at the ampulla (coronal image 36). There is mild hyperdensity in the distal CBD (series 2/image 26), likely reflecting sludge in the setting of recent ERCP. Pancreas: Within normal limits. Spleen: Within normal limits. Adrenals/Urinary Tract: Adrenal glands are within normal limits. Kidneys are within normal limits.  No hydronephrosis. Bladder is underdistended but unremarkable. Stomach/Bowel: Stomach is within normal limits. No evidence of bowel obstruction. Prior appendectomy. No colonic wall thickening or inflammatory changes. Vascular/Lymphatic: No evidence of abdominal aortic aneurysm. Atherosclerotic calcifications of the abdominal aorta and branch vessels. No suspicious abdominopelvic lymphadenopathy. Reproductive: Status post hysterectomy. No adnexal masses. Other: No abdominopelvic ascites. Musculoskeletal: Degenerative changes of the visualized thoracolumbar spine. IMPRESSION: Mild hyperdensity in the distal CBD, likely reflecting sludge in the setting of recent ERCP. Additional stable ancillary findings as above. Electronically Signed   By: Charline Bills M.D.   On: 09/15/2022 01:43   DG ERCP  Result Date: 09/14/2022 CLINICAL DATA:  ERCP.  Concern for choledocholithiasis. EXAM: ERCP TECHNIQUE: Multiple spot images obtained with the fluoroscopic device and submitted for interpretation post-procedure. COMPARISON:  MRCP-07/18/2022; CT of the chest, abdomen pelvis-07/21/2022 FINDINGS: Multiple spot fluoroscopic images of the right upper abdominal quadrant during ERCP are provided for review Initial image demonstrates an ERCP probe overlying the right  upper abdominal  quadrant. Cholecystectomy clips overlies expected location of the gallbladder fossa Subsequent images demonstrate selective cannulation and opacification of the common bile duct. There are potential nonocclusive filling defects within the CBD seen on image 2). Subsequent images demonstrate insufflation of a balloon within the central aspect of the CBD with subsequent biliary sweeping, presumed stone extraction and sphincterotomy. There is minimal opacification of the central aspect of the intrahepatic biliary tree which appears nondilated. There is minimal opacification of a cystic duct remnant which is noted to have a low insertion into the CBD. Reflux of contrast into the cystic duct remnant demonstrates an apparent occlusive filling defect, potentially representative of an air bubble though conceivably choledocholithiasis could have a similar appearance. There is no definitive opacification of the pancreatic duct. IMPRESSION: 1. ERCP with biliary sweeping, presumed stone extraction and sphincterotomy as above. 2. Reflux of contrast into a caudally inserted cystic duct remnant with apparent occlusive filling defect within the cystic duct, potentially representative of an air bubble, though conceivably choledocholithiasis within the cystic duct remnant could have a similar appearance. Correlation with the operative report is advised. These images were submitted for radiologic interpretation only. Please see the procedural report for the amount of contrast and the fluoroscopy time utilized. Electronically Signed   By: Simonne Come M.D.   On: 09/14/2022 13:59    Medications: Scheduled:  DULoxetine  40 mg Oral Daily   pantoprazole  40 mg Oral BID   sodium chloride flush  3 mL Intravenous Q12H   topiramate  50 mg Oral Daily   Continuous:  sodium chloride 75 mL/hr at 09/15/22 1225    Assessment/Plan: 1) Post ERCP pain - resolved. 2) Abnormal liver enzymes.  Clinically she is well.  No  complaints.  Her abdominal pain resolved shortly after her admission.  The liver panel is pending for his AM. Also, the patient denies any overt bleeding.  Plan: 1) Await liver panel.  If trending downwards or normalized she can be discharged home with outpatient [facility] follow up.  LOS: 1 day   Valerie [name] D 09/16/2022, 6:16 AM

## 2022-09-16 NOTE — Discharge Summary (Signed)
Physician Discharge Summary  Valerie [name] ZOX:[id number] DOB: 1950-11-19 DOA: 09/14/2022  PCP: Lance Bosch, NP  Admit date: 09/14/2022 Discharge date: 09/16/2022  Admitted From: Home Disposition: Home  Recommendations for Outpatient Follow-up:  Follow up with PCP in 1-2 weeks Follow with GI as scheduled  Home Health: None Equipment/Devices: None  Discharge Condition: Stable CODE STATUS: Full Diet recommendation: Low-salt low-fat bland diet  Brief/Interim Summary: Valerie [name] is a 72 y.o. female with medical history significant for hypertension, anxiety, and choledocholithiasis status post ERCP with sphincterotomy yesterday, now presenting with increased abdominal pain, nausea, and vomiting.    Patient admitted for worsening abdominal pain nausea vomiting, recently had ERCP with sphincterotomy and recovered quite well but unfortunately had worsening symptoms as above over the past 24 hours.  Fortunately over the past 48 hours patient symptoms have improved drastically, her labs have improved and she is now asymptomatic and back to baseline.  As such no indication for further imaging or procedure per discussion with GI she is otherwise stable and agreeable for discharge home.   Discharge Diagnoses:  Principal Problem:   Intractable abdominal pain Active Problems:   Essential hypertension   Depression   Choledocholithiasis   Hypokalemia   Abdominal pain  Intractable abdominal pain; elevated transaminases, resolved  Hypertension   Hypokalemia   Anxiety   Chronic headache   Discharge Instructions   Allergies as of 09/16/2022       Reactions   Alpha-gal Anaphylaxis, Hives, Other (See Comments)   Alpha-gal Syndrome (AGS)   Milk-related Compounds Anaphylaxis, Other (See Comments)   Alpha-gal Syndrome (AGS)   Other Anaphylaxis, Other (See Comments)   NO ANIMAL-DERIVED PRODUCTS!! Alpha-gal Syndrome (AGS)   Bactrim [sulfamethoxazole-trimethoprim] Other (See Comments)    Mouth ulcers and sores        Medication List     STOP taking these medications    amLODipine 5 MG tablet Commonly known as: NORVASC   aspirin EC 81 MG tablet   cetirizine 10 MG tablet Commonly known as: ZYRTEC   DULoxetine 20 MG capsule Commonly known as: Cymbalta   metoprolol succinate 50 MG 24 hr tablet Commonly known as: TOPROL-XL   mirabegron ER 25 MG Tb24 tablet Commonly known as: Myrbetriq   Olopatadine HCl 0.2 % Soln   valsartan 160 MG tablet Commonly known as: DIOVAN       TAKE these medications    acetaminophen 325 MG tablet Commonly known as: TYLENOL Take 325 mg by mouth 2 (two) times daily as needed (pain or headaches).   atorvastatin 10 MG tablet Commonly known as: LIPITOR Take 1 tablet (10 mg total) by mouth daily. for cholesterol. Start taking on: September 25, 2022 What changed: These instructions start on September 25, 2022. If you are unsure what to do until then, ask your doctor or other care provider.   azelastine 0.1 % nasal spray Commonly known as: ASTELIN Place 1 spray into both nostrils 2 (two) times daily. Use in each nostril as directed   CALCIUM-VITAMIN D PO Take 2 tablets by mouth daily.   EPINEPHrine 0.3 mg/0.3 mL Soaj injection Commonly known as: EPI-PEN Inject 0.3 mg into the muscle as directed.   fluticasone 50 MCG/ACT nasal spray Commonly known as: FLONASE USE 2 SPRAYS IN EACH NOSTRIL EVERY DAY What changed:  when to take this reasons to take this   omeprazole 40 MG capsule Commonly known as: PRILOSEC TAKE 1 CAPSULE TWICE DAILY FOR HEARTBURN What changed: See the new instructions.  topiramate 50 MG tablet Commonly known as: Topamax Take 1 tablet (50 mg total) by mouth daily. For headache prevention        Allergies  Allergen Reactions   Alpha-Gal Anaphylaxis, Hives and Other (See Comments)    Alpha-gal Syndrome (AGS)   Milk-Related Compounds Anaphylaxis and Other (See Comments)    Alpha-gal Syndrome (AGS)    Other Anaphylaxis and Other (See Comments)    NO ANIMAL-DERIVED PRODUCTS!! Alpha-gal Syndrome (AGS)   Bactrim [Sulfamethoxazole-Trimethoprim] Other (See Comments)    Mouth ulcers and sores    Consultations: GI  Procedures/Studies: CT ABDOMEN PELVIS W CONTRAST  Result Date: 09/15/2022 CLINICAL DATA:  Abdominal pain status post ERCP on 04/18. History of endometrial cancer. EXAM: CT ABDOMEN AND PELVIS WITH CONTRAST TECHNIQUE: Multidetector CT imaging of the abdomen and pelvis was performed using the standard protocol following bolus administration of intravenous contrast. RADIATION DOSE REDUCTION: This exam was performed according to the departmental dose-optimization program which includes automated exposure control, adjustment of the mA and/or kV according to patient size and/or use of iterative reconstruction technique. CONTRAST:  OMNIPAQUE IOHEXOL 300 MG/ML  SOLN COMPARISON:  MRI abdomen dated 07/25/2022. CT abdomen/pelvis dated 07/11/2022. FINDINGS: Lower chest: Mild dependent atelectasis in the lung bases. Hepatobiliary: Liver is within normal limits. Status post cholecystectomy. Mild central intrahepatic prominence. Common duct measures 10 mm but smoothly tapers at the ampulla (coronal image 36). There is mild hyperdensity in the distal CBD (series 2/image 26), likely reflecting sludge in the setting of recent ERCP. Pancreas: Within normal limits. Spleen: Within normal limits. Adrenals/Urinary Tract: Adrenal glands are within normal limits. Kidneys are within normal limits.  No hydronephrosis. Bladder is underdistended but unremarkable. Stomach/Bowel: Stomach is within normal limits. No evidence of bowel obstruction. Prior appendectomy. No colonic wall thickening or inflammatory changes. Vascular/Lymphatic: No evidence of abdominal aortic aneurysm. Atherosclerotic calcifications of the abdominal aorta and branch vessels. No suspicious abdominopelvic lymphadenopathy. Reproductive: Status post  hysterectomy. No adnexal masses. Other: No abdominopelvic ascites. Musculoskeletal: Degenerative changes of the visualized thoracolumbar spine. IMPRESSION: Mild hyperdensity in the distal CBD, likely reflecting sludge in the setting of recent ERCP. Additional stable ancillary findings as above. Electronically Signed   By: Charline Bills M.D.   On: 09/15/2022 01:43   DG ERCP  Result Date: 09/14/2022 CLINICAL DATA:  ERCP.  Concern for choledocholithiasis. EXAM: ERCP TECHNIQUE: Multiple spot images obtained with the fluoroscopic device and submitted for interpretation post-procedure. COMPARISON:  MRCP-07/18/2022; CT of the chest, abdomen pelvis-07/21/2022 FINDINGS: Multiple spot fluoroscopic images of the right upper abdominal quadrant during ERCP are provided for review Initial image demonstrates an ERCP probe overlying the right upper abdominal quadrant. Cholecystectomy clips overlies expected location of the gallbladder fossa Subsequent images demonstrate selective cannulation and opacification of the common bile duct. There are potential nonocclusive filling defects within the CBD seen on image 2). Subsequent images demonstrate insufflation of a balloon within the central aspect of the CBD with subsequent biliary sweeping, presumed stone extraction and sphincterotomy. There is minimal opacification of the central aspect of the intrahepatic biliary tree which appears nondilated. There is minimal opacification of a cystic duct remnant which is noted to have a low insertion into the CBD. Reflux of contrast into the cystic duct remnant demonstrates an apparent occlusive filling defect, potentially representative of an air bubble though conceivably choledocholithiasis could have a similar appearance. There is no definitive opacification of the pancreatic duct. IMPRESSION: 1. ERCP with biliary sweeping, presumed stone extraction and sphincterotomy as above. 2.  Reflux of contrast into a caudally inserted cystic  duct remnant with apparent occlusive filling defect within the cystic duct, potentially representative of an air bubble, though conceivably choledocholithiasis within the cystic duct remnant could have a similar appearance. Correlation with the operative report is advised. These images were submitted for radiologic interpretation only. Please see the procedural report for the amount of contrast and the fluoroscopy time utilized. Electronically Signed   By: Simonne Come M.D.   On: 09/14/2022 13:59     Subjective: No acute issues or events overnight   Discharge Exam: Vitals:   09/16/22 0023 09/16/22 0418  BP: 106/64 114/73  Pulse: 63 70  Resp: 18 18  Temp: 98.2 F (36.8 C) 98.2 F (36.8 C)  SpO2: 98% 96%   Vitals:   09/15/22 2138 09/16/22 0023 09/16/22 0418 09/16/22 0500  BP: 115/68 106/64 114/73   Pulse: 63 63 70   Resp:  18 18   Temp: 98 F (36.7 C) 98.2 F (36.8 C) 98.2 F (36.8 C)   TempSrc: Oral Oral Oral   SpO2: 95% 98% 96%   Weight:    58.6 kg  Height:        General: Pt is alert, awake, not in acute distress Cardiovascular: RRR, S1/S2 +, no rubs, no gallops Respiratory: CTA bilaterally, no wheezing, no rhonchi Abdominal: Soft, NT, ND, bowel sounds + Extremities: no edema, no cyanosis    The results of significant diagnostics from this hospitalization (including imaging, microbiology, ancillary and laboratory) are listed below for reference.     Microbiology: Recent Results (from the past 240 hour(s))  Urine Culture     Status: Abnormal (Preliminary result)   Collection Time: 09/14/22 11:03 PM   Specimen: Urine, Clean Catch  Result Value Ref Range Status   Specimen Description   Final    URINE, CLEAN CATCH Performed at [facility], [address] [place], [place] [address]    Special Requests   Final    Normal Performed at [facility], [address] [place], [place] [address]    Culture (A)  Final    >=100,000  COLONIES/mL STAPHYLOCOCCUS HAEMOLYTICUS SUSCEPTIBILITIES TO FOLLOW Performed at [facility], [address] [place], [place] [address]    Report Status PENDING  Incomplete     Labs: BNP (last 3 results) No results for input(s): "BNP" in the last 8760 hours. Basic Metabolic Panel: Recent Labs  Lab 09/14/22 2340 09/15/22 0534 09/16/22 0552  NA 136  --  138  K 3.1*  --  3.6  CL 104  --  108  CO2 23  --  25  GLUCOSE 149*  --  92  BUN 18  --  13  CREATININE 1.04*  --  0.91  CALCIUM 9.1  --  8.3*  MG  --  1.9  --    Liver Function Tests: Recent Labs  Lab 09/14/22 2340 09/16/22 0552  AST 241* 100*  ALT 107* 120*  ALKPHOS 75 62  BILITOT 1.2 1.0  PROT 7.0 5.9*  ALBUMIN 3.6 3.2*   Recent Labs  Lab 09/14/22 2340  LIPASE 50   No results for input(s): "AMMONIA" in the last 168 hours. CBC: Recent Labs  Lab 09/14/22 2340 09/16/22 0552  WBC 6.7 6.6  HGB 11.4* 9.8*  HCT 34.1* 30.2*  MCV 96.9 100.3*  PLT 178 143*   Cardiac Enzymes: No results for input(s): "CKTOTAL", "CKMB", "CKMBINDEX", "TROPONINI" in the last 168 hours. BNP: Invalid  input(s): "POCBNP" CBG: No results for input(s): "GLUCAP" in the last 168 hours. D-Dimer No results for input(s): "DDIMER" in the last 72 hours. Hgb A1c No results for input(s): "HGBA1C" in the last 72 hours. Lipid Profile No results for input(s): "CHOL", "HDL", "LDLCALC", "TRIG", "CHOLHDL", "LDLDIRECT" in the last 72 hours. Thyroid function studies No results for input(s): "TSH", "T4TOTAL", "T3FREE", "THYROIDAB" in the last 72 hours.  Invalid input(s): "FREET3" Anemia work up No results for input(s): "VITAMINB12", "FOLATE", "FERRITIN", "TIBC", "IRON", "RETICCTPCT" in the last 72 hours. Urinalysis    Component Value Date/Time   COLORURINE AMBER (A) 09/14/2022 2303   APPEARANCEUR CLOUDY (A) 09/14/2022 2303   APPEARANCEUR Cloudy (A) 05/12/2022 1050   LABSPEC 1.025 09/14/2022 2303   PHURINE 5.0 09/14/2022 2303    GLUCOSEU NEGATIVE 09/14/2022 2303   HGBUR SMALL (A) 09/14/2022 2303   BILIRUBINUR NEGATIVE 09/14/2022 2303   BILIRUBINUR Negative 05/12/2022 1050   KETONESUR NEGATIVE 09/14/2022 2303   PROTEINUR 30 (A) 09/14/2022 2303   UROBILINOGEN 0.2 12/08/2020 1036   NITRITE NEGATIVE 09/14/2022 2303   LEUKOCYTESUR SMALL (A) 09/14/2022 2303   Sepsis Labs Recent Labs  Lab 09/14/22 2340 09/16/22 0552  WBC 6.7 6.6   Microbiology Recent Results (from the past 240 hour(s))  Urine Culture     Status: Abnormal (Preliminary result)   Collection Time: 09/14/22 11:03 PM   Specimen: Urine, Clean Catch  Result Value Ref Range Status   Specimen Description   Final    URINE, CLEAN CATCH Performed at [facility], [address], [place], [place] [address]    Special Requests   Final    Normal Performed at [facility], [address] [place], [place] [address]    Culture (A)  Final    >=100,000 COLONIES/mL STAPHYLOCOCCUS HAEMOLYTICUS SUSCEPTIBILITIES TO FOLLOW Performed at [facility], [address], [place], [place] [address]    Report Status PENDING  Incomplete     Time coordinating discharge: Over 30 minutes  SIGNED:   Azucena Fallen, DO [facility] 09/16/2022, 2:48 PM Pager   If 7PM-7AM, please contact night-coverage [email or web address]

## 2022-09-16 NOTE — Progress Notes (Signed)
  Transition of Care [facility]) Screening Note   Patient Details  Name: CLORINDA WYBLE Date of Birth: 1951-02-26   Transition of Care [facility]) CM/SW Contact:    Adrian Prows, RN Phone Number: 09/16/2022, 11:04 AM    Transition of Care Department [facility]) has reviewed patient and no TOC needs have been identified at this time. We will continue to monitor patient advancement through interdisciplinary progression rounds. If new patient transition needs arise, please place a TOC consult.

## 2022-09-17 LAB — URINE CULTURE: Culture: >=100000 — AB

## 2022-09-18 ENCOUNTER — Ambulatory Visit: Payer: Self-pay

## 2022-09-18 ENCOUNTER — Encounter (HOSPITAL_COMMUNITY): Payer: Self-pay | Admitting: Gastroenterology

## 2022-09-18 ENCOUNTER — Telehealth: Payer: Self-pay | Admitting: *Deleted

## 2022-09-18 NOTE — Chronic Care Management (AMB) (Signed)
   09/18/2022  Valerie [name] 1951/05/24 [id number]  Reason for Encounter: Patient is not currently enrolled in the CCM program. CCM status changed to previously enrolled  Alto Denver RN, MSN, CCM RN Care Manager  Chronic Care Management Direct Number: [phone]

## 2022-09-18 NOTE — Transitions of Care (Post Inpatient/ED Visit) (Signed)
   09/18/2022  Name: Valerie [name] MRN: [id number] DOB: 22-Feb-1951  Today's TOC FU Call Status: Today's TOC FU Call Status:: Successful TOC FU Call Competed TOC FU Call Complete Date: 09/18/22  Transition Care Management Follow-up Telephone Call Date of Discharge: 09/16/22 Discharge Facility: [facility]) Type of Discharge: Inpatient Admission Primary Inpatient Discharge Diagnosis:: Intractable abdominal pain How have you been since you were released from the hospital?: Better (still weak) Any questions or concerns?: No  Items Reviewed: Did you receive and understand the discharge instructions provided?: Yes Medications obtained and verified?: Yes (Medications Reviewed) Any new allergies since your discharge?: No Dietary orders reviewed?: Yes (low fat bland diet) Type of Diet Ordered:: low fat bland diet Do you have support at home?: Yes People in Home: spouse Name of Support/Comfort Primary Source: Marylynn Pearson and Equipment/Supplies: Were Home Health Services Ordered?: NA Any new equipment or medical supplies ordered?: NA  Functional Questionnaire: Do you need assistance with bathing/showering or dressing?: No Do you need assistance with meal preparation?: No Do you need assistance with eating?: No Do you have difficulty maintaining continence: No Do you need assistance with getting out of bed/getting out of a chair/moving?: No Do you have difficulty managing or taking your medications?: No  Follow up appointments reviewed: PCP Follow-up appointment confirmed?: No (Patient will schedule appt. Not a [facility] physician) MD Provider Line [phone] Given: No Specialist Hospital Follow-up appointment confirmed?: No Reason Specialist Follow-Up Not Confirmed: Patient has Specialist Provider Number and will Call for Appointment Do you need transportation to your follow-up appointment?: No Do you understand care options if your condition(s) worsen?: Yes-patient  verbalized understanding  SDOH Interventions Today    Flowsheet Row Most Recent Value  SDOH Interventions   Food Insecurity Interventions Intervention Not Indicated  Housing Interventions Intervention Not Indicated  Transportation Interventions Intervention Not Indicated      Interventions Today    Flowsheet Row Most Recent Value  General Interventions   General Interventions Discussed/Reviewed General Interventions Discussed, General Interventions Reviewed, Doctor Visits  Doctor Visits Discussed/Reviewed Doctor Visits Reviewed, Doctor Visits Discussed, PCP, Specialist  [patient has changed PCP]      TOC Interventions Today    Flowsheet Row Most Recent Value  TOC Interventions   TOC Interventions Discussed/Reviewed TOC Interventions Discussed, TOC Interventions Reviewed  [Patient will schedule her appt. PCP is not a [facility] paractice]        Gean Maidens BSN RN [facility] [phone]

## 2022-09-19 ENCOUNTER — Telehealth: Payer: Self-pay | Admitting: Gastroenterology

## 2022-09-19 NOTE — Telephone Encounter (Signed)
The pt is a patient of Dr Tobi Bastos- Dr Meridee Score only did the EUS and further follow up should be with her primary GI. Pt aware and will call that office.

## 2022-09-19 NOTE — Telephone Encounter (Signed)
Inbound call from patient wanting to schedule for a f/u visit with provider.  Offered patient next available f/u with provider and PA but patient declined.   Please advise.

## 2022-09-26 DIAGNOSIS — R109 Unspecified abdominal pain: Secondary | ICD-10-CM | POA: Diagnosis not present

## 2022-10-09 DIAGNOSIS — R9431 Abnormal electrocardiogram [ECG] [EKG]: Secondary | ICD-10-CM | POA: Diagnosis not present

## 2022-10-09 DIAGNOSIS — I700 Atherosclerosis of aorta: Secondary | ICD-10-CM | POA: Diagnosis not present

## 2022-10-09 DIAGNOSIS — I10 Essential (primary) hypertension: Secondary | ICD-10-CM | POA: Diagnosis not present

## 2022-10-09 DIAGNOSIS — Z7982 Long term (current) use of aspirin: Secondary | ICD-10-CM | POA: Diagnosis not present

## 2022-10-09 DIAGNOSIS — E785 Hyperlipidemia, unspecified: Secondary | ICD-10-CM | POA: Diagnosis not present

## 2022-10-17 DIAGNOSIS — R634 Abnormal weight loss: Secondary | ICD-10-CM | POA: Diagnosis not present

## 2022-10-17 DIAGNOSIS — R1084 Generalized abdominal pain: Secondary | ICD-10-CM | POA: Diagnosis not present

## 2022-10-17 DIAGNOSIS — R7989 Other specified abnormal findings of blood chemistry: Secondary | ICD-10-CM | POA: Diagnosis not present

## 2022-10-30 ENCOUNTER — Telehealth: Payer: Self-pay

## 2022-10-30 NOTE — Telephone Encounter (Signed)
Patient left a message and states that she has been trying in contact with [place] for over a week and no has returned her call. She states she talk to [place] on 10/20/2022 and she told her she would send a message to Dr. Tobi Bastos nurse and they would give a call back. She states no one called her back. She called again and left a message on the answering machine on Wednesday and no one called her back   She states on 10/20/2022 she began to have Nausea and vomiting. She states she is having nausea every day. That is all day long. She states she is vomiting 1 to 2 times daily and yesterday 10/29/2022 vomited 5 times. She states she has been having constipation and has not had a bowel movement since 10/20/2022. She has been taking Linzess  and magnesium citrate and it is not effective. Patient states she needs help and is very frustrated no one has called her back

## 2022-10-30 NOTE — Telephone Encounter (Signed)
I was able to speak to Dr. Tobi Bastos and he recommended to be seen soon. However, he did not have anything available so he recommended for patient to be seen by Inetta Fermo. I offered the appointment to the patient and she agreed to come in tomorrow. Appointment was scheduled.

## 2022-10-31 ENCOUNTER — Encounter: Payer: Self-pay | Admitting: Physician Assistant

## 2022-10-31 ENCOUNTER — Ambulatory Visit: Payer: Medicare HMO | Admitting: Physician Assistant

## 2022-10-31 VITALS — BP 139/83 | HR 80 | Temp 98.0°F | Ht 64.0 in | Wt 122.6 lb

## 2022-10-31 DIAGNOSIS — R110 Nausea: Secondary | ICD-10-CM

## 2022-10-31 NOTE — Patient Instructions (Addendum)
Gastric Emptying study scheduled 11/17/22 @ 7:45 am [facility]. Nothing to eat/drink after midnight.     Constipation, Adult Constipation is when a person has fewer than three bowel movements in a week, has difficulty having a bowel movement, or has stools (feces) that are dry, hard, or larger than normal. Constipation may be caused by an underlying condition. It may become worse with age if a person takes certain medicines and does not take in enough fluids. Follow these instructions at home: Eating and drinking  Eat foods that have a lot of fiber, such as beans, whole grains, and fresh fruits and vegetables. Limit foods that are low in fiber and high in fat and processed sugars, such as fried or sweet foods. These include french fries, hamburgers, cookies, candies, and soda. Drink enough fluid to keep your urine pale yellow. General instructions Exercise regularly or as told by your health care provider. Try to do 150 minutes of moderate exercise each week. Use the bathroom when you have the urge to go. Do not hold it in. Take over-the-counter and prescription medicines only as told by your health care provider. This includes any fiber supplements. During bowel movements: Practice deep breathing while relaxing the lower abdomen. Practice pelvic floor relaxation. Watch your condition for any changes. Let your health care provider know about them. Keep all follow-up visits as told by your health care provider. This is important. Contact a health care provider if: You have pain that gets worse. You have a fever. You do not have a bowel movement after 4 days. You vomit. You are not hungry or you lose weight. You are bleeding from the opening between the buttocks (anus). You have thin, pencil-like stools. Get help right away if: You have a fever and your symptoms suddenly get worse. You leak stool or have blood in your stool. Your abdomen is bloated. You have severe pain in your abdomen. You  feel dizzy or you faint. Summary Constipation is when a person has fewer than three bowel movements in a week, has difficulty having a bowel movement, or has stools (feces) that are dry, hard, or larger than normal. Eat foods that have a lot of fiber, such as beans, whole grains, and fresh fruits and vegetables. Drink enough fluid to keep your urine pale yellow. Take over-the-counter and prescription medicines only as told by your health care provider. This includes any fiber supplements. This information is not intended to replace advice given to you by your health care provider. Make sure you discuss any questions you have with your health care provider. Document Revised: 03/29/2022 Document Reviewed: 03/29/2022 Elsevier Patient Education  2024 [employer].

## 2022-10-31 NOTE — Progress Notes (Signed)
Celso Amy, PA-C [address]  [address]  [place], [place] [address]  Main: [phone]  Fax: [phone]   Primary Care Physician: Lance Bosch, NP  Primary Gastroenterologist:  Dr. Wyline Mood   Chief Complaint  Patient presents with   Follow-up    Nausea and vomiting    HPI: Valerie [name] is a 72 y.o. female  Valerie [name] having chronic GI issues over the past several months with constant nausea.  Has occasional vomiting 1 or 2 times week and daily nausea worse postprandial.  Has epigastric pain and early satiety.  Denies dysphagia.  She has had elevated LFTs, diffuse abdominal pain, and weight loss.  Extensive Negative GI evaluation included negative abdominal pelvic CTs, EGD, ERCP, and colonoscopy.  She has chronic constipation. [name] that she takes Ex-Lax every 2 to 3 days.  She can go up to 1 to 2 weeks between stools. She denies any rectal bleeding or melena. Does have to manually disimpact at times. Tried Miralax with no benefit. She tried Linzess 2 doses, caused diarrhea and was expensive.  She denies alcohol tobacco and NSAIDs. [name] her sister has colon polyps but denies any other family GI history.   Previous patient of Dr. Tobi Bastos in our office.  She recently saw Bluford Kaufmann ordered, PA at [place] [facility] 10/17/2022 for a second opinion and discussed repeat colonoscopy.  She was told to follow back up with Dr. Tobi Bastos here in our office at [facility].    Labs 10/17/2022 showed normal CBC and CMP results.  Normal LFTs.  CT abdomen pelvis with contrast 09/15/2022 showed mild hyperdensity in the distal common bile duct, likely reflecting sludge post recent ERCP.  Otherwise no acute abnormality.  No masses.  No bowel inflammation.  Previous hysterectomy, appendectomy, and cholecystectomy.  Colonoscopy 07/2020 normal.  Excellent prep.  Small internal hemorrhoids.  Repeat in 10 years.  EUS by Dr. Meridee Score 09/14/2022 showed 1 cm hiatal hernia, mild  gastritis, and sludge in the common bile duct.  Normal pancreas.   Current Outpatient Medications  Medication Sig Dispense Refill   acetaminophen (TYLENOL) 325 MG tablet Take 325 mg by mouth 2 (two) times daily as needed (pain or headaches).     atorvastatin (LIPITOR) 10 MG tablet Take 1 tablet (10 mg total) by mouth daily. for cholesterol. 90 tablet 0   azelastine (ASTELIN) 0.1 % nasal spray Place 1 spray into both nostrils 2 (two) times daily. Use in each nostril as directed 30 mL 5   CALCIUM-VITAMIN D PO Take 2 tablets by mouth daily.     EPINEPHrine 0.3 mg/0.3 mL IJ SOAJ injection Inject 0.3 mg into the muscle as directed. 2 each 1   fluticasone (FLONASE) 50 MCG/ACT nasal spray USE 2 SPRAYS IN EACH NOSTRIL EVERY DAY (Patient taking differently: Place 2 sprays into both nostrils daily as needed for allergies or rhinitis.) 48 g 0   omeprazole (PRILOSEC) 40 MG capsule TAKE 1 CAPSULE TWICE DAILY FOR HEARTBURN (Patient taking differently: Take 40 mg by mouth 2 (two) times daily before a meal.) 180 capsule 0   topiramate (TOPAMAX) 50 MG tablet Take 1 tablet (50 mg total) by mouth daily. For headache prevention 90 tablet 2   No current facility-administered medications for this visit.    Allergies as of 10/31/2022 - Review Complete 10/31/2022  Allergen Reaction Noted   Alpha-gal Anaphylaxis, Hives, and Other (See Comments) 09/15/2022   Milk (cow) Anaphylaxis 09/15/2022   Milk-related compounds Anaphylaxis  and Other (See Comments) 09/15/2022   Other Anaphylaxis and Other (See Comments) 09/15/2022   Bactrim [sulfamethoxazole-trimethoprim] Other (See Comments) 03/22/2022    Past Medical History:  Diagnosis Date   Acid reflux    Adenocarcinoma of the endometrium/uterus (HCC) 12/04/2000   Anal fissure    Anxiety    Atrophic vaginitis    COVID-19 virus infection 09/28/2020   Difficult intubation    limited neck mobility   Esophagitis    Hernia    Present to proximal to umbillical     History of hiatal hernia    Hyperlipidemia    Hypertension    Internal hemorrhoids    Menorrhagia    Osteopenia    PONV (postoperative nausea and vomiting)    Recurrent UTI    Vertigo    Vitamin D deficiency     Past Surgical History:  Procedure Laterality Date   27 HOUR PH STUDY N/A 11/29/2015   Procedure: 24 HOUR PH STUDY;  Surgeon: Ruffin Frederick, MD;  Location: [facility];  Service: Gastroenterology;  Laterality: N/A;   ABDOMINAL HYSTERECTOMY  12/04/2000   also bilateral salpingo-oophorectomy   CARDIAC CATHETERIZATION  2006   CARPAL TUNNEL RELEASE  11/20/2011   Procedure: CARPAL TUNNEL RELEASE;  Surgeon: Mariam Dollar, MD;  Location: [facility];  Service: Neurosurgery;  Laterality: Left;  LEFT carpal tunnel release   CERVICAL SPINE SURGERY  2013   [facility]   CHOLECYSTECTOMY  1997   COLONOSCOPY WITH PROPOFOL N/A 07/27/2020   Procedure: COLONOSCOPY WITH PROPOFOL;  Surgeon: Midge Minium, MD;  Location: [facility] ENDOSCOPY;  Service: Endoscopy;  Laterality: N/A;   DILATION AND CURETTAGE OF UTERUS     11/15/2000   ENDOSCOPIC RETROGRADE CHOLANGIOPANCREATOGRAPHY (ERCP) WITH PROPOFOL N/A 09/14/2022   Procedure: ENDOSCOPIC RETROGRADE CHOLANGIOPANCREATOGRAPHY (ERCP) WITH PROPOFOL;  Surgeon: Meridee Score Netty Starring., MD;  Location: [facility];  Service: Gastroenterology;  Laterality: N/A;   ESOPHAGEAL MANOMETRY N/A 09/20/2015   Procedure: ESOPHAGEAL MANOMETRY (EM);  Surgeon: Ruffin Frederick, MD;  Location: [facility];  Service: Gastroenterology;  Laterality: N/A;   ESOPHAGOGASTRODUODENOSCOPY (EGD) WITH PROPOFOL N/A 05/17/2021   Procedure: ESOPHAGOGASTRODUODENOSCOPY (EGD) WITH PROPOFOL;  Surgeon: Pasty Spillers, MD;  Location: [facility] ENDOSCOPY;  Service: Endoscopy;  Laterality: N/A;   ESOPHAGOGASTRODUODENOSCOPY (EGD) WITH PROPOFOL N/A 07/17/2022   Procedure: ESOPHAGOGASTRODUODENOSCOPY (EGD) WITH PROPOFOL;  Surgeon: Wyline Mood, MD;  Location: [facility]COPY;  Service:  Gastroenterology;  Laterality: N/A;   ESOPHAGOGASTRODUODENOSCOPY (EGD) WITH PROPOFOL N/A 09/14/2022   Procedure: ESOPHAGOGASTRODUODENOSCOPY (EGD) WITH PROPOFOL;  Surgeon: Meridee Score Netty Starring., MD;  Location: [facility];  Service: Gastroenterology;  Laterality: N/A;   EUS N/A 09/14/2022   Procedure: UPPER ENDOSCOPIC ULTRASOUND (EUS) RADIAL;  Surgeon: Lemar Lofty., MD;  Location: [facility];  Service: Gastroenterology;  Laterality: N/A;   HYSTEROSCOPY  11/15/2000   D & C, resection of endometrial polyps   LUMBAR SPINAL CORD SIMULATOR LEAD REMOVAL N/A 08/01/2021   Procedure: REMOVAL OF SPINAL CORD STIMULATOR;  Surgeon: Venetia Night, MD;  Location: [facility] ORS;  Service: Neurosurgery;  Laterality: N/A;   OOPHORECTOMY  12/04/2000   bilateral salpingo-oophorectomy done with TAH   REMOVAL OF STONES  09/14/2022   Procedure: REMOVAL OF STONES;  Surgeon: Meridee Score Netty Starring., MD;  Location: [facility];  Service: Gastroenterology;;   Dennison Mascot  09/14/2022   Procedure: Dennison Mascot;  Surgeon: Meridee Score Netty Starring., MD;  Location: [facility];  Service: Gastroenterology;;   SPINAL CORD STIMULATOR INSERTION Bilateral 04/12/2020   Procedure: INSERTION CERVICAL SPINAL STIMULATOR PULSE GENERATOR;  Surgeon: Lucy Chris, MD;  Location: [facility] ORS;  Service: Neurosurgery;  Laterality: Bilateral;   SPINAL CORD STIMULATOR TRIAL N/A 03/22/2020   Procedure: CERVICAL SPINAL CORD STIMULATOR TRIAL PERCUTANEOUS;  Surgeon: Lucy Chris, MD;  Location: [facility] ORS;  Service: Neurosurgery;  Laterality: N/A;   TUBAL LIGATION      Review of Systems:    All systems reviewed and negative except where noted in HPI.   Physical Examination:   BP 139/83   Pulse 80   Temp 98 F (36.7 C)   Ht 5\' 4"  (1.626 m)   Wt 122 lb 9.6 oz (55.6 kg)   BMI 21.04 kg/m   General: Well-nourished, well-developed in no acute distress.  Eyes: No icterus. Conjunctivae pink. Mouth: Oropharyngeal mucosa moist and  pink , no lesions erythema or exudate. Lungs: Clear to auscultation bilaterally. Non-labored. Heart: Regular rate and rhythm, no murmurs rubs or gallops.  Abdomen: Bowel sounds are normal; Abdomen is Soft; No hepatosplenomegaly, masses or hernias;  No Abdominal Tenderness; No guarding or rebound tenderness. Extremities: No lower extremity edema. No clubbing or deformities. Neuro: Alert and oriented x 3.  Grossly intact. Skin: Warm and dry, no jaundice.   Psych: Alert and cooperative, normal mood and affect.   Imaging Studies: No results found.  Assessment and Plan:   ARLENI KWIATEK is a 72 y.o. y/o female returns for follow-up of multiple chronic GI symptoms including chronic constipation, GERD, nausea, and abdominal pain.  Extensive GI evaluation has been unrevealing including multiple abdominal pelvic CTs, EGD, colonoscopy, ERCP.  She has history of alpha gal and lactose intolerance.  I am ordering gastric emptying study to evaluate for gastroparesis.  I also gave samples of Trulance to treat constipation.  Reassurance.  Follow-up with Dr. Tobi Bastos for further disposition.  Chronic abdominal pain  Reassurance.  Labs 10/17/2022 showed normal CBC and CMP results.  Possibly due to constipation.  I am adjusting her treatment with follow-up.  Chronic constipation  Start Trulance 3 mg 1 capsule once daily, samples given.  High-fiber diet.  64 ounces of water daily.  She can also take OTC Senokot 8.6 mg 2 tablets once or twice daily.  I encouraged her to stop taking Ex-Lax.  Chronic Nausea  Ordering gastric emptying study to evaluate for gastroparesis.  I recommend she stop Topamax which can cause nausea and decreased appetite.  GERD  Continue omeprazole 40 Mg twice daily. Recommend Lifestyle Modifications to prevent Acid Reflux.  Rec. Avoid coffee, sodas, peppermint, citrus fruits, and spicey foods.  Avoid eating 2-3 hours before bedtime.   Early satiety, nausea, weight loss  Recommend she  eat small meals 5 or 6 times per day.  Eat high calorie foods to increase caloric intake.  Continue to monitor her weight.  Elevated LFTs Reassurance, recent LFTs were normal.    Celso Amy, PA-C  Follow up in 4-6 weeks with Dr. Tobi Bastos

## 2022-11-07 ENCOUNTER — Ambulatory Visit: Payer: Medicare HMO | Admitting: Gastroenterology

## 2022-11-08 ENCOUNTER — Ambulatory Visit: Payer: Medicare HMO | Admitting: Physician Assistant

## 2022-11-10 ENCOUNTER — Other Ambulatory Visit: Payer: Self-pay | Admitting: Primary Care

## 2022-11-10 ENCOUNTER — Ambulatory Visit: Payer: Medicare HMO | Admitting: Physician Assistant

## 2022-11-10 DIAGNOSIS — J309 Allergic rhinitis, unspecified: Secondary | ICD-10-CM

## 2022-11-10 DIAGNOSIS — R519 Headache, unspecified: Secondary | ICD-10-CM

## 2022-11-10 NOTE — Telephone Encounter (Signed)
Called patient bad connection will try to call back later.

## 2022-11-10 NOTE — Telephone Encounter (Signed)
Called and spoke with patient, she states she did establish with Lance Bosch at one time when Jae Dire was unavailable, however she has been unhappy with her care and her intentions are to keep Jae Dire as her PCP. Scheduled patient for f/u appt 11/14/22.

## 2022-11-10 NOTE — Telephone Encounter (Signed)
Please call patient:  Has she established with a new PCP? Lance Bosch? If so, then please wish her the very best and remove my name as PCP. I will decline refill requests.

## 2022-11-11 ENCOUNTER — Other Ambulatory Visit: Payer: Self-pay | Admitting: Primary Care

## 2022-11-11 DIAGNOSIS — K219 Gastro-esophageal reflux disease without esophagitis: Secondary | ICD-10-CM

## 2022-11-11 NOTE — Telephone Encounter (Signed)
Noted  

## 2022-11-14 ENCOUNTER — Ambulatory Visit (INDEPENDENT_AMBULATORY_CARE_PROVIDER_SITE_OTHER): Payer: Medicare HMO | Admitting: Primary Care

## 2022-11-14 ENCOUNTER — Ambulatory Visit: Payer: Medicare HMO | Admitting: Internal Medicine

## 2022-11-14 ENCOUNTER — Encounter: Payer: Self-pay | Admitting: Primary Care

## 2022-11-14 ENCOUNTER — Other Ambulatory Visit: Payer: Self-pay

## 2022-11-14 VITALS — BP 130/80 | HR 90 | Temp 97.2°F | Resp 20 | Wt 124.0 lb

## 2022-11-14 VITALS — BP 128/74 | HR 75 | Temp 98.1°F | Ht 64.0 in | Wt 122.0 lb

## 2022-11-14 DIAGNOSIS — M8580 Other specified disorders of bone density and structure, unspecified site: Secondary | ICD-10-CM

## 2022-11-14 DIAGNOSIS — R002 Palpitations: Secondary | ICD-10-CM

## 2022-11-14 DIAGNOSIS — H1013 Acute atopic conjunctivitis, bilateral: Secondary | ICD-10-CM

## 2022-11-14 DIAGNOSIS — F32A Depression, unspecified: Secondary | ICD-10-CM

## 2022-11-14 DIAGNOSIS — E785 Hyperlipidemia, unspecified: Secondary | ICD-10-CM

## 2022-11-14 DIAGNOSIS — R7303 Prediabetes: Secondary | ICD-10-CM

## 2022-11-14 DIAGNOSIS — R110 Nausea: Secondary | ICD-10-CM

## 2022-11-14 DIAGNOSIS — K5909 Other constipation: Secondary | ICD-10-CM | POA: Diagnosis not present

## 2022-11-14 DIAGNOSIS — I10 Essential (primary) hypertension: Secondary | ICD-10-CM

## 2022-11-14 DIAGNOSIS — K219 Gastro-esophageal reflux disease without esophagitis: Secondary | ICD-10-CM

## 2022-11-14 DIAGNOSIS — J3089 Other allergic rhinitis: Secondary | ICD-10-CM

## 2022-11-14 DIAGNOSIS — Z91018 Allergy to other foods: Secondary | ICD-10-CM

## 2022-11-14 DIAGNOSIS — F411 Generalized anxiety disorder: Secondary | ICD-10-CM

## 2022-11-14 DIAGNOSIS — G8929 Other chronic pain: Secondary | ICD-10-CM

## 2022-11-14 DIAGNOSIS — M545 Low back pain, unspecified: Secondary | ICD-10-CM | POA: Diagnosis not present

## 2022-11-14 DIAGNOSIS — J309 Allergic rhinitis, unspecified: Secondary | ICD-10-CM

## 2022-11-14 DIAGNOSIS — R1084 Generalized abdominal pain: Secondary | ICD-10-CM

## 2022-11-14 DIAGNOSIS — Z0000 Encounter for general adult medical examination without abnormal findings: Secondary | ICD-10-CM | POA: Diagnosis not present

## 2022-11-14 DIAGNOSIS — E2839 Other primary ovarian failure: Secondary | ICD-10-CM

## 2022-11-14 DIAGNOSIS — Z1231 Encounter for screening mammogram for malignant neoplasm of breast: Secondary | ICD-10-CM

## 2022-11-14 DIAGNOSIS — R519 Headache, unspecified: Secondary | ICD-10-CM

## 2022-11-14 LAB — LIPID PANEL
Cholesterol: 124 mg/dL (ref 0–200)
HDL: 37.5 mg/dL — ABNORMAL LOW (ref >39.00)
LDL Cholesterol: 68 mg/dL (ref 0–99)
NonHDL: 86.59
Total CHOL/HDL Ratio: 3
Triglycerides: 95 mg/dL (ref 0.0–149.0)
VLDL: 19 mg/dL (ref 0.0–40.0)

## 2022-11-14 LAB — HEMOGLOBIN A1C: Hgb A1c MFr Bld: 5 % (ref 4.6–6.5)

## 2022-11-14 MED ORDER — CETIRIZINE HCL 10 MG PO TABS: 10.0000 mg | ORAL_TABLET | Freq: Every day | ORAL | 5 refills | Status: AC

## 2022-11-14 MED ORDER — FLUTICASONE PROPIONATE 50 MCG/ACT NA SUSP
2.0000 | Freq: Every day | NASAL | 1 refills | Status: DC | PRN
Start: 2022-11-14 — End: 2023-01-25

## 2022-11-14 MED ORDER — EPINEPHRINE 0.3 MG/0.3ML IJ SOAJ: 0.3000 mg | INTRAMUSCULAR | 1 refills | Status: AC

## 2022-11-14 MED ORDER — AZELASTINE HCL 0.1 % NA SOLN: 1.0000 | Freq: Two times a day (BID) | NASAL | 5 refills | Status: AC

## 2022-11-14 NOTE — Assessment & Plan Note (Signed)
Chronic and ongoing. Continue Tylenol 500 mg BID.   Offered physical therapy for which she kindly declines.  She will update if she changes her mind.

## 2022-11-14 NOTE — Assessment & Plan Note (Signed)
Immunizations UTD. Mammogram and bone density scan due in August, orders placed. Colonoscopy UTD, due 2032  Discussed the importance of a healthy diet and regular exercise in order for weight loss, and to reduce the risk of further co-morbidity.  Exam stable. Labs pending.  Follow up in 1 year for repeat physical.

## 2022-11-14 NOTE — Assessment & Plan Note (Signed)
Controlled.  Continue off medications.. Continue to monitor.  

## 2022-11-14 NOTE — Assessment & Plan Note (Signed)
Repeat A1c pending. 

## 2022-11-14 NOTE — Progress Notes (Signed)
Subjective:    Patient ID: Valerie [name], female    DOB: [birth year], 72 y.o.   MRN: [id number]  HPI  Valerie [name] is a very pleasant 72 y.o. female who presents today for complete physical and follow up of chronic conditions.  Immunizations: -Tetanus: Completed in 2017 -Shingles: Completed Shingrix series -Pneumonia: Completed Prevnar 20 in 2022  Diet: Fair diet.  Exercise: No regular exercise.  Eye exam: Completes annually  Dental exam: Completes semi-annually    Mammogram: August 2023 Bone Density Scan: August 2022  Colonoscopy: Completed in 2022, due 2032  BP Readings from Last 3 Encounters:  11/14/22 128/74  10/31/22 139/83  09/16/22 114/73   Wt Readings from Last 3 Encounters:  11/14/22 122 lb (55.3 kg)  10/31/22 122 lb 9.6 oz (55.6 kg)  09/16/22 129 lb 3 oz (58.6 kg)       Review of Systems  Constitutional:  Negative for unexpected weight change.  HENT:  Negative for rhinorrhea.   Respiratory:  Negative for cough and shortness of breath.   Cardiovascular:  Negative for chest pain.  Gastrointestinal:  Positive for abdominal pain and constipation. Negative for diarrhea.  Genitourinary:  Negative for difficulty urinating.  Musculoskeletal:  Positive for arthralgias and back pain.  Skin:  Negative for rash.  Allergic/Immunologic: Negative for environmental allergies.  Neurological:  Negative for dizziness, numbness and headaches.  Psychiatric/Behavioral:  The patient is not nervous/anxious.          Past Medical History:  Diagnosis Date   Acid reflux    Acute cystitis with hematuria 12/08/2020   Adenocarcinoma of the endometrium/uterus (HCC) 12/04/2000   Anal fissure    Anxiety    Atrophic vaginitis    COVID-19 virus infection 09/28/2020   Dehydration    Difficult intubation    limited neck mobility   Dysuria 08/13/2020   Esophagitis    Hernia    Present to proximal to umbillical    History of hiatal hernia    Hyperlipidemia    Hypertension     Hypokalemia 09/15/2022   Internal hemorrhoids    Intractable abdominal pain 09/15/2022   Lactic acidosis    Menorrhagia    Osteopenia    Partial small bowel obstruction (HCC) 01/26/2021   PONV (postoperative nausea and vomiting)    Recurrent UTI    Vertigo    Vitamin D deficiency     Social History   Socioeconomic History   Marital status: Married    Spouse name: Not on file   Number of children: Not on file   Years of education: Not on file   Highest education level: Not on file  Occupational History   Not on file  Tobacco Use   Smoking status: Never    Passive exposure: Never   Smokeless tobacco: Never  Vaping Use   Vaping Use: Never used  Substance and Sexual Activity   Alcohol use: No   Drug use: Never   Sexual activity: Yes    Birth control/protection: Post-menopausal, Surgical  Other Topics Concern   Not on file  Social History Narrative   Married   Lives in [place]   Has 2 children, 4 grandchildren   Enjoys walking, shopping.   Social Determinants of Health   Financial Resource Strain: Low Risk  (09/12/2022)   Overall Financial Resource Strain (CARDIA)    Difficulty of Paying Living Expenses: Not hard at all  Food Insecurity: No Food Insecurity (09/18/2022)   Hunger Vital Sign  Worried About Programme researcher, broadcasting/film/video in the Last Year: Never true    Ran Out of Food in the Last Year: Never true  Transportation Needs: No Transportation Needs (09/18/2022)   PRAPARE - Administrator, Civil Service (Medical): No    Lack of Transportation (Non-Medical): No  Physical Activity: Inactive (09/12/2022)   Exercise Vital Sign    Days of Exercise per Week: 0 days    Minutes of Exercise per Session: 0 min  Stress: No Stress Concern Present (09/12/2022)   [employer] of Occupational Health - Occupational Stress Questionnaire    Feeling of Stress : Not at all  Social Connections: Moderately Integrated (09/12/2022)   Social Connection and Isolation Panel  [NHANES]    Frequency of Communication with Friends and Family: More than three times a week    Frequency of Social Gatherings with Friends and Family: More than three times a week    Attends Religious Services: More than 4 times per year    Active Member of [employer] or Organizations: No    Attends Banker Meetings: Never    Marital Status: Married  Catering manager Violence: Not At Risk (09/15/2022)   Humiliation, Afraid, Rape, and Kick questionnaire    Fear of Current or Ex-Partner: No    Emotionally Abused: No    Physically Abused: No    Sexually Abused: No    Past Surgical History:  Procedure Laterality Date   64 HOUR PH STUDY N/A 11/29/2015   Procedure: 24 HOUR PH STUDY;  Surgeon: Ruffin Frederick, MD;  Location: [facility];  Service: Gastroenterology;  Laterality: N/A;   ABDOMINAL HYSTERECTOMY  12/04/2000   also bilateral salpingo-oophorectomy   CARDIAC CATHETERIZATION  2006   CARPAL TUNNEL RELEASE  11/20/2011   Procedure: CARPAL TUNNEL RELEASE;  Surgeon: Mariam Dollar, MD;  Location: [facility];  Service: Neurosurgery;  Laterality: Left;  LEFT carpal tunnel release   CERVICAL SPINE SURGERY  2013   [facility]   CHOLECYSTECTOMY  1997   COLONOSCOPY WITH PROPOFOL N/A 07/27/2020   Procedure: COLONOSCOPY WITH PROPOFOL;  Surgeon: Midge Minium, MD;  Location: [facility] ENDOSCOPY;  Service: Endoscopy;  Laterality: N/A;   DILATION AND CURETTAGE OF UTERUS     11/15/2000   ENDOSCOPIC RETROGRADE CHOLANGIOPANCREATOGRAPHY (ERCP) WITH PROPOFOL N/A 09/14/2022   Procedure: ENDOSCOPIC RETROGRADE CHOLANGIOPANCREATOGRAPHY (ERCP) WITH PROPOFOL;  Surgeon: Meridee Score Netty Starring., MD;  Location: [facility];  Service: Gastroenterology;  Laterality: N/A;   ESOPHAGEAL MANOMETRY N/A 09/20/2015   Procedure: ESOPHAGEAL MANOMETRY (EM);  Surgeon: Ruffin Frederick, MD;  Location: [facility];  Service: Gastroenterology;  Laterality: N/A;   ESOPHAGOGASTRODUODENOSCOPY (EGD) WITH PROPOFOL N/A  05/17/2021   Procedure: ESOPHAGOGASTRODUODENOSCOPY (EGD) WITH PROPOFOL;  Surgeon: Pasty Spillers, MD;  Location: [facility] ENDOSCOPY;  Service: Endoscopy;  Laterality: N/A;   ESOPHAGOGASTRODUODENOSCOPY (EGD) WITH PROPOFOL N/A 07/17/2022   Procedure: ESOPHAGOGASTRODUODENOSCOPY (EGD) WITH PROPOFOL;  Surgeon: Wyline Mood, MD;  Location: [facility] ENDOSCOPY;  Service: Gastroenterology;  Laterality: N/A;   ESOPHAGOGASTRODUODENOSCOPY (EGD) WITH PROPOFOL N/A 09/14/2022   Procedure: ESOPHAGOGASTRODUODENOSCOPY (EGD) WITH PROPOFOL;  Surgeon: Meridee Score Netty Starring., MD;  Location: [facility];  Service: Gastroenterology;  Laterality: N/A;   EUS N/A 09/14/2022   Procedure: UPPER ENDOSCOPIC ULTRASOUND (EUS) RADIAL;  Surgeon: Lemar Lofty., MD;  Location: [facility];  Service: Gastroenterology;  Laterality: N/A;   HYSTEROSCOPY  11/15/2000   D & C, resection of endometrial polyps   LUMBAR SPINAL CORD SIMULATOR LEAD REMOVAL N/A 08/01/2021   Procedure: REMOVAL  OF SPINAL CORD STIMULATOR;  Surgeon: Venetia Night, MD;  Location: [facility] ORS;  Service: Neurosurgery;  Laterality: N/A;   OOPHORECTOMY  12/04/2000   bilateral salpingo-oophorectomy done with TAH   REMOVAL OF STONES  09/14/2022   Procedure: REMOVAL OF STONES;  Surgeon: Meridee Score Netty Starring., MD;  Location: [facility];  Service: Gastroenterology;;   Dennison Mascot  09/14/2022   Procedure: Dennison Mascot;  Surgeon: Mansouraty, Netty Starring., MD;  Location: [facility];  Service: Gastroenterology;;   SPINAL CORD STIMULATOR INSERTION Bilateral 04/12/2020   Procedure: INSERTION CERVICAL SPINAL STIMULATOR PULSE GENERATOR;  Surgeon: Lucy Chris, MD;  Location: [facility] ORS;  Service: Neurosurgery;  Laterality: Bilateral;   SPINAL CORD STIMULATOR TRIAL N/A 03/22/2020   Procedure: CERVICAL SPINAL CORD STIMULATOR TRIAL PERCUTANEOUS;  Surgeon: Lucy Chris, MD;  Location: [facility] ORS;  Service: Neurosurgery;  Laterality: N/A;   TUBAL LIGATION      Family  History  Problem Relation Age of Onset   Hypertension Father    Prostate cancer Father    Hypertension Sister    Lung cancer Sister    Diabetes Brother    Heart disease Brother    Hypertension Brother    Diabetes Brother    Heart disease Brother    Breast cancer Neg Hx     Allergies  Allergen Reactions   Alpha-Gal Anaphylaxis, Hives and Other (See Comments)    Alpha-gal Syndrome (AGS)   Milk (Cow) Anaphylaxis    Alpha-gal Syndrome (AGS)   Milk-Related Compounds Anaphylaxis and Other (See Comments)    Alpha-gal Syndrome (AGS)   Other Anaphylaxis and Other (See Comments)    NO ANIMAL-DERIVED PRODUCTS!! Alpha-gal Syndrome (AGS)   Bactrim [Sulfamethoxazole-Trimethoprim] Other (See Comments)    Mouth ulcers and sores    Current Outpatient Medications on File Prior to Visit  Medication Sig Dispense Refill   acetaminophen (TYLENOL) 325 MG tablet Take 325 mg by mouth 2 (two) times daily as needed (pain or headaches).     atorvastatin (LIPITOR) 10 MG tablet Take 1 tablet (10 mg total) by mouth daily. for cholesterol. 90 tablet 0   azelastine (ASTELIN) 0.1 % nasal spray Place 1 spray into both nostrils 2 (two) times daily. Use in each nostril as directed 30 mL 5   CALCIUM-VITAMIN D PO Take 2 tablets by mouth daily.     EPINEPHrine 0.3 mg/0.3 mL IJ SOAJ injection Inject 0.3 mg into the muscle as directed. 2 each 1   fluticasone (FLONASE) 50 MCG/ACT nasal spray Place 2 sprays into both nostrils daily as needed for allergies or rhinitis. 48 g 0   omeprazole (PRILOSEC) 40 MG capsule TAKE 1 CAPSULE TWICE DAILY FOR HEARTBURN (Patient taking differently: Take 40 mg by mouth 2 (two) times daily before a meal.) 180 capsule 0   topiramate (TOPAMAX) 50 MG tablet Take 1 tablet (50 mg total) by mouth daily. For headache prevention 90 tablet 2   No current facility-administered medications on file prior to visit.    BP 128/74   Pulse 75   Temp 98.1 F (36.7 C) (Temporal)   Ht 5\' 4"  (1.626 m)    Wt 122 lb (55.3 kg)   SpO2 98%   BMI 20.94 kg/m  Objective:   Physical Exam HENT:     Right Ear: Tympanic membrane and ear canal normal.     Left Ear: Tympanic membrane and ear canal normal.     Nose: Nose normal.  Eyes:     Conjunctiva/sclera: Conjunctivae normal.     Pupils: Pupils are  equal, round, and reactive to light.  Neck:     Thyroid: No thyromegaly.  Cardiovascular:     Rate and Rhythm: Normal rate and regular rhythm.     Heart sounds: No murmur heard. Pulmonary:     Effort: Pulmonary effort is normal.     Breath sounds: Normal breath sounds. No rales.  Abdominal:     General: Bowel sounds are normal.     Palpations: Abdomen is soft.     Tenderness: There is no abdominal tenderness.  Musculoskeletal:        General: Normal range of motion.     Cervical back: Neck supple.  Lymphadenopathy:     Cervical: No cervical adenopathy.  Skin:    General: Skin is warm and dry.     Findings: No rash.  Neurological:     Mental Status: She is alert and oriented to person, place, and time.     Cranial Nerves: No cranial nerve deficit.     Deep Tendon Reflexes: Reflexes are normal and symmetric.  Psychiatric:        Mood and Affect: Mood normal.           Assessment & Plan:  Preventative health care Assessment & Plan: Immunizations UTD. Mammogram and bone density scan due in August, orders placed. Colonoscopy UTD, due 2032  Discussed the importance of a healthy diet and regular exercise in order for weight loss, and to reduce the risk of further co-morbidity.  Exam stable. Labs pending.  Follow up in 1 year for repeat physical.    Essential hypertension Assessment & Plan: Controlled.  Continue off medications. Continue to monitor.    Chronic constipation Assessment & Plan: Chronic and continued.  Failed numerous OTC and prescription treatments.  Following with GI. Reviewed office notes from June 2024.   Gastroesophageal reflux disease,  unspecified whether esophagitis present Assessment & Plan: Controlled.  Continue omeprazole 40 mg BID. Following with GI   Osteopenia, unspecified location Assessment & Plan: Bone density scan due, orders placed.    Screening mammogram for breast cancer -     3D Screening Mammogram, Left and Right; Future  Estrogen deficiency -     DG Bone Density; Future  Chronic bilateral low back pain, unspecified whether sciatica present Assessment & Plan: Chronic and ongoing. Continue Tylenol 500 mg BID.   Offered physical therapy for which she kindly declines.  She will update if she changes her mind.      Chronic nausea Assessment & Plan: Chronic and ongoing.  Following with GI.  Proceed with gastric emptying test as scheduled.    Depression, unspecified depression type Assessment & Plan: No concerns today.    Frequent headaches Assessment & Plan: Controlled.  Continue Topamax 50 mg at bedtime.   Generalized abdominal pain Assessment & Plan: Chronic and continued. Following with GI.  Reviewed CT abdomen/pelvis from April 2024.   Generalized anxiety disorder Assessment & Plan: Controlled.  No concerns today.   Hyperlipidemia, unspecified hyperlipidemia type Assessment & Plan: Repeat lipid panel pending.  Orders: -     Lipid panel  Palpitations Assessment & Plan: Resolved.  Remain off of metoprolol.   Prediabetes Assessment & Plan: Repeat A1c pending.  Orders: -     Hemoglobin A1c        Doreene Nest, NP

## 2022-11-14 NOTE — Assessment & Plan Note (Signed)
Controlled.  Continue omeprazole 40 mg BID. Following with GI

## 2022-11-14 NOTE — Assessment & Plan Note (Signed)
Controlled. No concerns today. 

## 2022-11-14 NOTE — Assessment & Plan Note (Signed)
Resolved.  Remain off of metoprolol.

## 2022-11-14 NOTE — Assessment & Plan Note (Signed)
Chronic and ongoing.  Following with GI.  Proceed with gastric emptying test as scheduled.

## 2022-11-14 NOTE — Assessment & Plan Note (Signed)
Controlled.  Continue Topamax 50 mg at bedtime.

## 2022-11-14 NOTE — Patient Instructions (Signed)
Stop by the lab prior to leaving today. I will notify you of your results once received.   [facility] your mamm[facility] and bone density scan.   It was a pleasure to see you today!   

## 2022-11-14 NOTE — Progress Notes (Signed)
FOLLOW UP Date of Service/Encounter:  11/14/22   Subjective:  Valerie [name] (DOB: 12-18-1950) is a 72 y.o. female who [facility] for follow up for allergic rhinitis and alpha gal.   History obtained from: chart review and patient. Last visit was on 05/16/2022 for rhinitis/alpha gal. Has Epipen.  Discussed Flonase/Azelastine/Zyrtec.  Rhinoconjunctivitis: She does have trouble with mowing and having a lot of sneezing.  But outside of this is doing well.  She is using Flonase daily, Zyrtec PRN.  Not using Azelastine at all.  She does note that the Flonase helps. Not much itchy watery eyes.   Alpha Gal Allergy: Avoiding all mammalian meat and dairy products.  She has never had any trouble with dairy in the past but is just scarred.  Eating chicken and [place].   No reactions.  No tick bites.  Has an Epipen.  Past Medical History: Past Medical History:  Diagnosis Date   Acid reflux    Acute cystitis with hematuria 12/08/2020   Adenocarcinoma of the endometrium/uterus (HCC) 12/04/2000   Anal fissure    Anxiety    Atrophic vaginitis    COVID-19 virus infection 09/28/2020   Dehydration    Difficult intubation    limited neck mobility   Dysuria 08/13/2020   Esophagitis    Hernia    Present to proximal to umbillical    History of hiatal hernia    Hyperlipidemia    Hypertension    Hypokalemia 09/15/2022   Internal hemorrhoids    Intractable abdominal pain 09/15/2022   Lactic acidosis    Menorrhagia    Osteopenia    Partial small bowel obstruction (HCC) 01/26/2021   PONV (postoperative nausea and vomiting)    Recurrent UTI    Vertigo    Vitamin D deficiency     Objective:  BP 130/80   Pulse 90   Temp (!) 97.2 F (36.2 C) (Temporal)   Resp 20   Wt 124 lb (56.2 kg)   SpO2 98%   BMI 21.28 kg/m  Body mass index is 21.28 kg/m. Physical Exam: GEN: alert, well developed HEENT: clear conjunctiva, nose with mild inferior  turbinate hypertrophy, pink nasal mucosa, clear rhinorrhea, no cobblestoning HEART: regular rate and rhythm, no murmur LUNGS: clear to auscultation bilaterally, no coughing, unlabored respiration SKIN: no rashes or lesions   Assessment:   1. Allergy to alpha-gal   2. Perennial allergic rhinitis   3. Allergic conjunctivitis of both eyes     Plan/Recommendations:  Mammalian Meat Allergy (galactose-alpha-1, 3-galactose allergy AKA Alpha Gal): - Strictly avoid all mammalian meat (beef, pork, venison, lamb, goat, and bison, etc) - You may continue to eat all poultry and seafood - Always carry your EpiPen with you at all times to be used in case of severe reaction - We can check yearly. Last was 02/2022. - for SKIN only reaction, okay to take Benadryl 25mg  capsules every 6 hours as needed. - for SKIN + ANY additional symptoms, OR IF concern for LIFE THREATENING reaction = Epipen Autoinjector EpiPen 0.3 mg. - If using Epinephrine autoinjector, call 911 or go to the ER. - Okay to eat dairy.     Allergic Rhinitis Allergic Conjunctivitis - Improved with medications as discussed below.   - Positive skin test 02/2022: cat, dog, mold - Avoidance measures discussed. - Use nasal saline rinses before nose sprays such as with Neilmed Sinus Rinse.  Use distilled water.   - Use Flonase 2  sprays each nostril daily. Aim upward and outward. - Use Azelastine 1 sprays each nostril twice daily as needed for congestion, drainage, sneezing. Aim upward and outward. - Use Olopatadine 1 eye drops as needed daily for itchy watery eyes.   - Use Zyrtec 10 mg daily as needed for runny nose and itchy watery eyes.     Return in about 6 months (around 05/16/2023).  Alesia Morin, MD [facility]

## 2022-11-14 NOTE — Assessment & Plan Note (Signed)
Repeat lipid panel pending. 

## 2022-11-14 NOTE — Patient Instructions (Addendum)
Return in about 6 months (around 05/16/2023).   Mammalian Meat Allergy (galactose-alpha-1, 3-galactose allergy AKA Alpha Gal): - Strictly avoid all mammalian meat (beef, pork, venison, lamb, goat, and bison, etc) - You may continue to eat all poultry and seafood - Always carry your EpiPen with you at all times to be used in case of severe reaction - We can check yearly. Last was 02/2022. - for SKIN only reaction, okay to take Benadryl 25mg  capsules every 6 hours as needed. - for SKIN + ANY additional symptoms, OR IF concern for LIFE THREATENING reaction = Epipen Autoinjector EpiPen 0.3 mg. - If using Epinephrine autoinjector, call 911 or go to the ER. - Okay to eat dairy.     Allergic Rhinitis Allergic Conjunctivitis - Improved  - Positive skin test 02/2022: cat, dog, mold - Avoidance measures discussed. - Use nasal saline rinses before nose sprays such as with Neilmed Sinus Rinse.  Use distilled water.   - Use Flonase 2 sprays each nostril daily. Aim upward and outward. - Use Azelastine 1 sprays each nostril twice daily as needed for congestion, drainage, sneezing. Aim upward and outward. - Use Olopatadine 1 eye drops as needed daily for itchy watery eyes.   - Use Zyrtec 10 mg daily as needed for runny nose and itchy watery eyes.

## 2022-11-14 NOTE — Assessment & Plan Note (Signed)
Chronic and continued. Following with GI.  Reviewed CT abdomen/pelvis from April 2024.

## 2022-11-14 NOTE — Assessment & Plan Note (Signed)
No concerns today 

## 2022-11-14 NOTE — Assessment & Plan Note (Signed)
Chronic and continued.  Failed numerous OTC and prescription treatments.  Following with GI. Reviewed office notes from June 2024.

## 2022-11-14 NOTE — Assessment & Plan Note (Signed)
Bone density scan due, orders placed. ?

## 2022-11-17 ENCOUNTER — Encounter
Admission: RE | Admit: 2022-11-17 | Discharge: 2022-11-17 | Disposition: A | Discharge status: Home / Self Care | Payer: Medicare HMO | Source: Ambulatory Visit | Attending: Physician Assistant | Admitting: Physician Assistant

## 2022-11-17 DIAGNOSIS — R110 Nausea: Secondary | ICD-10-CM

## 2022-11-17 MED ORDER — TECHNETIUM TC 99M SULFUR COLLOID
2.0000 | Freq: Once | INTRAVENOUS | Status: AC
  Administered 2022-11-17: 2.32 via ORAL

## 2022-11-23 ENCOUNTER — Other Ambulatory Visit: Payer: Self-pay

## 2022-11-27 NOTE — Progress Notes (Unsigned)
Celso Amy, PA-C [address]  [address]  [place], [place] [address]  Main: [phone]  Fax: [phone]   Primary Care Physician: Doreene Nest, NP  Primary Gastroenterologist:  Dr. Wyline Mood / Celso Amy, PA-C   CC: F/U Nausea and Vomiting, abdominal pain and chronic constipation  HPI: Valerie [name] is a 72 y.o. female, estab. Pt of Dr. Tobi Bastos, returns for 1 month follow-up of multiple chronic GI symptoms including episodes of abdominal pain, nausea, vomiting, early satiety, and chronic constipation.  History of GERD with esophagitis since 2005 and takes omeprazole 40 Mg twice daily.  Has failed multiple other PPIs in the past.   Has alpha gal and milk allergy.  Cholecystectomy in 1997.  She was given Trulance 3 mg 1 capsule daily for the past month.  She has failed multiple constipation treatments in the past including Linzess.  She was advised to hold Topamax to see if that would help her nausea.  She stopped taking Trulance because it did not help.  Currently she is taking MiraLAX 1 capful once daily and Senokot 2 tablets once daily.  She is currently having a hard bowel movement once per week with straining.  Occasionally does manual disimpaction.  Denies rectal bleeding.  She has lower abdominal cramping daily.  Continues to have moderate nausea.  No recent vomiting.  Has some epigastric discomfort.  Generalized abdominal pain throughout her entire abdomen.  She rarely takes Topamax, only as needed.  Her weight is up 2 pounds in the past month (from 122 - 124).   -Gastric emptying study 11/17/2022 was normal.  -EGD 06/2022 by Dr. Tobi Bastos was normal.  Gastric biopsies negative for H. pylori.  -Abdominal MRI / MRCP 08/2022 `showed sludge in the bile duct post cholecystectomy.   -ERCP & EUS by Dr. Meridee Score 09/14/2022 showed 1 cm hiatal hernia, mild gastritis, and sludge / debri removed from biliary and pancreatic duct..  Normal pancreas.  -CT abdomen pelvis with contrast  09/15/2022 showed mild hyperdensity in the distal common bile duct, likely reflecting sludge post recent ERCP.  Otherwise no acute abnormality.  No masses.  No bowel inflammation.  Previous hysterectomy, appendectomy, and cholecystectomy.  -Labs 10/17/2022 showed normal CBC and CMP results.  Normal LFTs.  LFTs were elevated 08/2022, however LFTs returned to normal post ERCP after sludge was removed from bile duct.  -Colonoscopy 07/2020 normal.  Excellent prep.  Small internal hemorrhoids.  Repeat in 10 years.  -Had 24 hour pH manometry test in 2017 on PPI which was normal, suggesting functional heartburn - no pathologic acid reflux.   Current Outpatient Medications  Medication Sig Dispense Refill   acetaminophen (TYLENOL) 325 MG tablet Take 325 mg by mouth 2 (two) times daily as needed (pain or headaches).     atorvastatin (LIPITOR) 10 MG tablet Take 1 tablet (10 mg total) by mouth daily. for cholesterol. 90 tablet 0   azelastine (ASTELIN) 0.1 % nasal spray Place 1 spray into both nostrils 2 (two) times daily. Use in each nostril as directed 30 mL 5   CALCIUM-VITAMIN D PO Take 2 tablets by mouth daily.     cetirizine (ZYRTEC ALLERGY) 10 MG tablet Take 1 tablet (10 mg total) by mouth daily. 30 tablet 5   EPINEPHrine 0.3 mg/0.3 mL IJ SOAJ injection Inject 0.3 mg into the muscle as directed. 2 each 1   fluticasone (FLONASE) 50 MCG/ACT nasal spray Place 2 sprays into both nostrils daily as needed for allergies or  rhinitis. 48 g 1   omeprazole (PRILOSEC) 40 MG capsule TAKE 1 CAPSULE TWICE DAILY FOR HEARTBURN (Patient taking differently: Take 40 mg by mouth 2 (two) times daily before a meal.) 180 capsule 0   topiramate (TOPAMAX) 50 MG tablet Take 1 tablet (50 mg total) by mouth daily. For headache prevention 90 tablet 2   No current facility-administered medications for this visit.    Allergies as of 11/28/2022 - Review Complete 11/28/2022  Allergen Reaction Noted   Alpha-gal Anaphylaxis, Hives, and  Other (See Comments) 09/15/2022   Milk (cow) Anaphylaxis 09/15/2022   Milk-related compounds Anaphylaxis and Other (See Comments) 09/15/2022   Other Anaphylaxis and Other (See Comments) 09/15/2022   Bactrim [sulfamethoxazole-trimethoprim] Other (See Comments) 03/22/2022    Past Medical History:  Diagnosis Date   Acid reflux    Acute cystitis with hematuria 12/08/2020   Adenocarcinoma of the endometrium/uterus (HCC) 12/04/2000   Anal fissure    Anxiety    Atrophic vaginitis    COVID-19 virus infection 09/28/2020   Dehydration    Difficult intubation    limited neck mobility   Dysuria 08/13/2020   Esophagitis    Hernia    Present to proximal to umbillical    History of hiatal hernia    Hyperlipidemia    Hypertension    Hypokalemia 09/15/2022   Internal hemorrhoids    Intractable abdominal pain 09/15/2022   Lactic acidosis    Menorrhagia    Osteopenia    Partial small bowel obstruction (HCC) 01/26/2021   PONV (postoperative nausea and vomiting)    Recurrent UTI    Vertigo    Vitamin D deficiency     Past Surgical History:  Procedure Laterality Date   63 HOUR PH STUDY N/A 11/29/2015   Procedure: 24 HOUR PH STUDY;  Surgeon: Ruffin Frederick, MD;  Location: [facility];  Service: Gastroenterology;  Laterality: N/A;   ABDOMINAL HYSTERECTOMY  12/04/2000   also bilateral salpingo-oophorectomy   CARDIAC CATHETERIZATION  2006   CARPAL TUNNEL RELEASE  11/20/2011   Procedure: CARPAL TUNNEL RELEASE;  Surgeon: Mariam Dollar, MD;  Location: [facility];  Service: Neurosurgery;  Laterality: Left;  LEFT carpal tunnel release   CERVICAL SPINE SURGERY  2013   [facility]   CHOLECYSTECTOMY  1997   COLONOSCOPY WITH PROPOFOL N/A 07/27/2020   Procedure: COLONOSCOPY WITH PROPOFOL;  Surgeon: Midge Minium, MD;  Location: [facility] ENDOSCOPY;  Service: Endoscopy;  Laterality: N/A;   DILATION AND CURETTAGE OF UTERUS     11/15/2000   ENDOSCOPIC RETROGRADE CHOLANGIOPANCREATOGRAPHY (ERCP) WITH PROPOFOL  N/A 09/14/2022   Procedure: ENDOSCOPIC RETROGRADE CHOLANGIOPANCREATOGRAPHY (ERCP) WITH PROPOFOL;  Surgeon: Meridee Score Netty Starring., MD;  Location: [facility];  Service: Gastroenterology;  Laterality: N/A;   ESOPHAGEAL MANOMETRY N/A 09/20/2015   Procedure: ESOPHAGEAL MANOMETRY (EM);  Surgeon: Ruffin Frederick, MD;  Location: [facility];  Service: Gastroenterology;  Laterality: N/A;   ESOPHAGOGASTRODUODENOSCOPY (EGD) WITH PROPOFOL N/A 05/17/2021   Procedure: ESOPHAGOGASTRODUODENOSCOPY (EGD) WITH PROPOFOL;  Surgeon: Pasty Spillers, MD;  Location: [facility] ENDOSCOPY;  Service: Endoscopy;  Laterality: N/A;   ESOPHAGOGASTRODUODENOSCOPY (EGD) WITH PROPOFOL N/A 07/17/2022   Procedure: ESOPHAGOGASTRODUODENOSCOPY (EGD) WITH PROPOFOL;  Surgeon: Wyline Mood, MD;  Location: [facility] ENDOSCOPY;  Service: Gastroenterology;  Laterality: N/A;   ESOPHAGOGASTRODUODENOSCOPY (EGD) WITH PROPOFOL N/A 09/14/2022   Procedure: ESOPHAGOGASTRODUODENOSCOPY (EGD) WITH PROPOFOL;  Surgeon: Meridee Score Netty Starring., MD;  Location: [facility];  Service: Gastroenterology;  Laterality: N/A;   EUS N/A 09/14/2022   Procedure: UPPER ENDOSCOPIC ULTRASOUND (EUS) RADIAL;  Surgeon: Lemar Lofty., MD;  Location: [facility];  Service: Gastroenterology;  Laterality: N/A;   HYSTEROSCOPY  11/15/2000   D & C, resection of endometrial polyps   LUMBAR SPINAL CORD SIMULATOR LEAD REMOVAL N/A 08/01/2021   Procedure: REMOVAL OF SPINAL CORD STIMULATOR;  Surgeon: Venetia Night, MD;  Location: [facility] ORS;  Service: Neurosurgery;  Laterality: N/A;   OOPHORECTOMY  12/04/2000   bilateral salpingo-oophorectomy done with TAH   REMOVAL OF STONES  09/14/2022   Procedure: REMOVAL OF STONES;  Surgeon: Meridee Score Netty Starring., MD;  Location: [facility];  Service: Gastroenterology;;   Dennison Mascot  09/14/2022   Procedure: Dennison Mascot;  Surgeon: Mansouraty, Netty Starring., MD;  Location: [facility];  Service: Gastroenterology;;   SPINAL CORD  STIMULATOR INSERTION Bilateral 04/12/2020   Procedure: INSERTION CERVICAL SPINAL STIMULATOR PULSE GENERATOR;  Surgeon: Lucy Chris, MD;  Location: [facility] ORS;  Service: Neurosurgery;  Laterality: Bilateral;   SPINAL CORD STIMULATOR TRIAL N/A 03/22/2020   Procedure: CERVICAL SPINAL CORD STIMULATOR TRIAL PERCUTANEOUS;  Surgeon: Lucy Chris, MD;  Location: [facility] ORS;  Service: Neurosurgery;  Laterality: N/A;   TUBAL LIGATION      Review of Systems:    All systems reviewed and negative except where noted in HPI.   Physical Examination:   BP 132/79   Pulse 62   Temp 97.7 F (36.5 C)   Ht 5\' 4"  (1.626 m)   Wt 123 lb 9.6 oz (56.1 kg)   BMI 21.22 kg/m   General: Well-nourished, well-developed in no acute distress.  Eyes: No icterus. Conjunctivae pink. Mouth: Oropharyngeal mucosa moist and pink , no lesions erythema or exudate. Lungs: Clear to auscultation bilaterally. Non-labored. Heart: Regular rate and rhythm, no murmurs rubs or gallops.  Abdomen: Bowel sounds are normal; Abdomen is Soft; No hepatosplenomegaly, masses or hernias; generalized mild abdominal tenderness throughout the entire upper and lower abdomen bilaterally; No guarding or rebound tenderness. Extremities: No lower extremity edema. No clubbing or deformities. Neuro: Alert and oriented x 3.  Grossly intact. Skin: Warm and dry, no jaundice.   Psych: Alert and cooperative, normal mood and affect.   Imaging Studies: NM Gastric Emptying  Result Date: 11/17/2022 CLINICAL DATA:  Nausea EXAM: NUCLEAR MEDICINE GASTRIC EMPTYING SCAN TECHNIQUE: After oral ingestion of radiolabeled meal, sequential abdominal images were obtained for 4 hours. Percentage of activity emptying the stomach was calculated at 1 hour, 2 hour, 3 hour, and 4 hours. RADIOPHARMACEUTICALS:  2.32 mCi Tc-43m sulfur colloid in standardized meal COMPARISON:  CT September 15, 2022 FINDINGS: Expected location of the stomach in the left upper quadrant. Ingested meal empties  the stomach gradually over the course of the study. 17% emptied at 1 hr ( normal >= 10%) 59% emptied at 2 hr ( normal >= 40%) 89% emptied at 3 hr ( normal >= 70%) 100% emptied at 4 hr ( normal >= 90%) IMPRESSION: No scintigraphic evidence of delayed gastric emptying. Electronically Signed   By: Maudry Mayhew M.D.   On: 11/17/2022 14:31    Assessment and Plan:   GINETTA WAYLAND is a 72 y.o. y/o female returns for follow-up of multiple chronic GI symptoms including abdominal pain, chronic constipation, nausea, vomiting, and acid reflux.  Cholecystectomy in 1997.  Underwent ERCP 08/2022 with sludge and debris removed from the biliary and pancreatic ducts.  Labs 09/2022 showed LFTs returned to normal.  EUS and abdominal MRI showed normal pancreas.  Normal EGD 06/2022.  Normal colonoscopy 07/2020.  Recent gastric emptying test was normal.  She was  given samples of Trulance to take for the past month.  Linzess did not work well for her in the past.  1.  Chronic constipation / IBS-C  I gave her Ibsrela samples 1 tablet twice daily to take for constipation.  If the Allena Napoleon does not work, then she will take MiraLAX 1 capful twice daily and Senokot 2 tablets twice daily.  2.  Chronic GERD / Functional Heartburn  Continue omeprazole 40 Mg twice daily.  Add Carafate 1 g 3 times daily before meals.  If symptoms persist, consider adding baclofen for refractory GERD in the future.    3.  Choledocholithiasis status post ERCP 08/2022 with sludge and debris removed from bile duct.  Cholecystectomy in 1997.  4.  Chronic nausea; suspect functional dyspepsia  Remain off Topamax.  Uncertain etiology.  Try Carafate to see if this helps.  Extensive negative GI workup including normal EGD, negative gastric emptying study.  5.  Chronic abdominal pain(upper and lower) -  IBS-C  I gave her Ibsrela samples 1 tablet twice daily to take for constipation.  Rx dicyclomine10mg  BID.  I warned her about potential adverse side effects  including dizziness, drowsiness, increased fall risk.  6.  Colon cancer screening  Normal colonoscopy 07/2020.  10-year repeat pending health status.  7.  Weight loss - Stable; abdominal pelvic CT 08/2022 showed no evidence of cancer or masses.   Celso Amy, PA-C  Follow up in 3 months.

## 2022-11-28 ENCOUNTER — Encounter: Payer: Self-pay | Admitting: Physician Assistant

## 2022-11-28 ENCOUNTER — Ambulatory Visit: Payer: Medicare HMO | Admitting: Physician Assistant

## 2022-11-28 VITALS — BP 132/79 | HR 62 | Temp 97.7°F | Ht 64.0 in | Wt 123.6 lb

## 2022-11-28 DIAGNOSIS — R1084 Generalized abdominal pain: Secondary | ICD-10-CM | POA: Diagnosis not present

## 2022-11-28 DIAGNOSIS — K581 Irritable bowel syndrome with constipation: Secondary | ICD-10-CM

## 2022-11-28 DIAGNOSIS — K2100 Gastro-esophageal reflux disease with esophagitis, without bleeding: Secondary | ICD-10-CM

## 2022-11-28 DIAGNOSIS — R110 Nausea: Secondary | ICD-10-CM | POA: Diagnosis not present

## 2022-11-28 MED ORDER — DICYCLOMINE HCL 10 MG PO CAPS
10.0000 mg | ORAL_CAPSULE | Freq: Two times a day (BID) | ORAL | 2 refills | Status: DC
Start: 2022-11-28 — End: 2023-01-04

## 2022-11-28 MED ORDER — IBSRELA 50 MG PO TABS
1.0000 | ORAL_TABLET | Freq: Two times a day (BID) | ORAL | 0 refills | Status: AC
Start: 2022-11-28 — End: 2022-12-28

## 2022-11-28 MED ORDER — SUCRALFATE 1 G PO TABS
1.0000 g | ORAL_TABLET | Freq: Three times a day (TID) | ORAL | 2 refills | Status: DC
Start: 2022-11-28 — End: 2023-05-16

## 2022-11-29 LAB — CBC WITH DIFFERENTIAL
Basophils Absolute: 0 10*3/uL (ref 0.0–0.2)
Basos: 0 %
EOS (ABSOLUTE): 0 10*3/uL (ref 0.0–0.4)
Eos: 1 %
Hematocrit: 37.3 % (ref 34.0–46.6)
Hemoglobin: 12.1 g/dL (ref 11.1–15.9)
Immature Grans (Abs): 0 10*3/uL (ref 0.0–0.1)
Immature Granulocytes: 0 %
Lymphocytes Absolute: 1.7 10*3/uL (ref 0.7–3.1)
Lymphs: 46 %
MCH: 31.5 pg (ref 26.6–33.0)
MCHC: 32.4 g/dL (ref 31.5–35.7)
MCV: 97 fL (ref 79–97)
Monocytes Absolute: 0.3 10*3/uL (ref 0.1–0.9)
Monocytes: 9 %
Neutrophils Absolute: 1.7 10*3/uL (ref 1.4–7.0)
Neutrophils: 44 %
RBC: 3.84 x10E6/uL (ref 3.77–5.28)
RDW: 12.8 % (ref 11.7–15.4)
WBC: 3.7 10*3/uL (ref 3.4–10.8)

## 2022-11-29 LAB — COMPREHENSIVE METABOLIC PANEL
ALT: 15 IU/L (ref 0–32)
AST: 25 IU/L (ref 0–40)
Albumin: 4.2 g/dL (ref 3.9–4.9)
Alkaline Phosphatase: 68 IU/L (ref 44–121)
BUN/Creatinine Ratio: 8 — ABNORMAL LOW (ref 12–28)
BUN: 6 mg/dL — ABNORMAL LOW (ref 8–27)
Bilirubin Total: 0.4 mg/dL (ref 0.0–1.2)
CO2: 24 mmol/L (ref 20–29)
Calcium: 9.6 mg/dL (ref 8.7–10.3)
Chloride: 105 mmol/L (ref 96–106)
Creatinine, Ser: 0.71 mg/dL (ref 0.57–1.00)
Globulin, Total: 2.4 g/dL (ref 1.5–4.5)
Glucose: 88 mg/dL (ref 70–99)
Potassium: 4.4 mmol/L (ref 3.5–5.2)
Sodium: 143 mmol/L (ref 134–144)
Total Protein: 6.6 g/dL (ref 6.0–8.5)
eGFR: 91 mL/min/{1.73_m2} (ref >59)

## 2022-11-29 LAB — LIPASE: Lipase: 63 U/L (ref 14–72)

## 2022-12-11 ENCOUNTER — Ambulatory Visit: Payer: Medicare HMO | Admitting: Primary Care

## 2022-12-11 ENCOUNTER — Encounter: Payer: Self-pay | Admitting: Primary Care

## 2022-12-11 ENCOUNTER — Ambulatory Visit (INDEPENDENT_AMBULATORY_CARE_PROVIDER_SITE_OTHER)
Admission: RE | Admit: 2022-12-11 | Discharge: 2022-12-11 | Disposition: A | Discharge status: Home / Self Care | Payer: Medicare HMO | Source: Ambulatory Visit | Attending: Primary Care | Admitting: Primary Care

## 2022-12-11 VITALS — BP 130/76 | HR 81 | Temp 98.0°F | Ht 64.0 in | Wt 121.2 lb

## 2022-12-11 DIAGNOSIS — G8929 Other chronic pain: Secondary | ICD-10-CM | POA: Diagnosis not present

## 2022-12-11 DIAGNOSIS — M549 Dorsalgia, unspecified: Secondary | ICD-10-CM | POA: Diagnosis not present

## 2022-12-11 DIAGNOSIS — M47816 Spondylosis without myelopathy or radiculopathy, lumbar region: Secondary | ICD-10-CM | POA: Diagnosis not present

## 2022-12-11 DIAGNOSIS — M5126 Other intervertebral disc displacement, lumbar region: Secondary | ICD-10-CM | POA: Diagnosis not present

## 2022-12-11 DIAGNOSIS — M5441 Lumbago with sciatica, right side: Secondary | ICD-10-CM

## 2022-12-11 DIAGNOSIS — R229 Localized swelling, mass and lump, unspecified: Secondary | ICD-10-CM | POA: Insufficient documentation

## 2022-12-11 NOTE — Assessment & Plan Note (Signed)
Exam consistent for cyst or lipoma.  Checking x-ray today as the mass is slightly lateral to lumbar spine. Consider soft tissue ultrasound if needed.

## 2022-12-11 NOTE — Progress Notes (Signed)
Subjective:    Patient ID: Valerie [name], female    DOB: December 28, 1950, 72 y.o.   MRN: [id number]  Back Pain Associated symptoms include numbness.    Valerie [name] is a very pleasant 72 y.o. female with a history of neck pain with cervical spinal surgery, chronic back pain, frequent headaches, osteopenia who presents today to discuss back pain.  Her back pain is located to the right lower back for which began about 1-2 months ago. She can also feel a "knot" to the right lower back. Her pain is a constant dull pain that intermittently intensifies to a "sharp" pain.   She does notice intermittent radiation of her pain to the right anterior and posterior thigh and sometimes to her right groin. She also notices intermittent numbness to her anterior and posterior thigh.  She's been taking 1000 mg of Tylenol twice daily with little improvement. She often has to sit onto her left side to reduce her pain.   She denies injury/trauma, loss of bowel/bladder control.    Review of Systems  Genitourinary:        Denies loss of bowel/bladder control.  Musculoskeletal:  Positive for back pain and myalgias.  Neurological:  Positive for numbness.         Past Medical History:  Diagnosis Date   Acid reflux    Acute cystitis with hematuria 12/08/2020   Adenocarcinoma of the endometrium/uterus (HCC) 12/04/2000   Anal fissure    Anxiety    Atrophic vaginitis    COVID-19 virus infection 09/28/2020   Dehydration    Difficult intubation    limited neck mobility   Dysuria 08/13/2020   Esophagitis    Hernia    Present to proximal to umbillical    History of hiatal hernia    Hyperlipidemia    Hypertension    Hypokalemia 09/15/2022   Internal hemorrhoids    Intractable abdominal pain 09/15/2022   Lactic acidosis    Menorrhagia    Osteopenia    Partial small bowel obstruction (HCC) 01/26/2021   PONV (postoperative nausea and vomiting)    Recurrent UTI    Vertigo    Vitamin D deficiency      Social History   Socioeconomic History   Marital status: Married    Spouse name: Not on file   Number of children: Not on file   Years of education: Not on file   Highest education level: Not on file  Occupational History   Not on file  Tobacco Use   Smoking status: Never    Passive exposure: Never   Smokeless tobacco: Never  Vaping Use   Vaping status: Never Used  Substance and Sexual Activity   Alcohol use: No   Drug use: Never   Sexual activity: Yes    Birth control/protection: Post-menopausal, Surgical  Other Topics Concern   Not on file  Social History Narrative   Married   Lives in [place]   Has 2 children, 4 grandchildren   Enjoys walking, shopping.   Social Determinants of Health   Financial Resource Strain: Low Risk  (09/12/2022)   Overall Financial Resource Strain (CARDIA)    Difficulty of Paying Living Expenses: Not hard at all  Food Insecurity: No Food Insecurity (09/18/2022)   Hunger Vital Sign    Worried About Running Out of Food in the Last Year: Never true    Ran Out of Food in the Last Year: Never true  Transportation Needs: No Transportation Needs (09/18/2022)  PRAPARE - Administrator, Civil Service (Medical): No    Lack of Transportation (Non-Medical): No  Physical Activity: Inactive (09/12/2022)   Exercise Vital Sign    Days of Exercise per Week: 0 days    Minutes of Exercise per Session: 0 min  Stress: No Stress Concern Present (09/12/2022)   [employer] of Occupational Health - Occupational Stress Questionnaire    Feeling of Stress : Not at all  Social Connections: Moderately Integrated (09/12/2022)   Social Connection and Isolation Panel [NHANES]    Frequency of Communication with Friends and Family: More than three times a week    Frequency of Social Gatherings with Friends and Family: More than three times a week    Attends Religious Services: More than 4 times per year    Active Member of [employer] or Organizations: No     Attends Banker Meetings: Never    Marital Status: Married  Catering manager Violence: Not At Risk (09/15/2022)   Humiliation, Afraid, Rape, and Kick questionnaire    Fear of Current or Ex-Partner: No    Emotionally Abused: No    Physically Abused: No    Sexually Abused: No    Past Surgical History:  Procedure Laterality Date   56 HOUR PH STUDY N/A 11/29/2015   Procedure: 24 HOUR PH STUDY;  Surgeon: Ruffin Frederick, MD;  Location: [facility];  Service: Gastroenterology;  Laterality: N/A;   ABDOMINAL HYSTERECTOMY  12/04/2000   also bilateral salpingo-oophorectomy   CARDIAC CATHETERIZATION  2006   CARPAL TUNNEL RELEASE  11/20/2011   Procedure: CARPAL TUNNEL RELEASE;  Surgeon: Mariam Dollar, MD;  Location: [facility];  Service: Neurosurgery;  Laterality: Left;  LEFT carpal tunnel release   CERVICAL SPINE SURGERY  2013   [facility]   CHOLECYSTECTOMY  1997   COLONOSCOPY WITH PROPOFOL N/A 07/27/2020   Procedure: COLONOSCOPY WITH PROPOFOL;  Surgeon: Midge Minium, MD;  Location: [facility] ENDOSCOPY;  Service: Endoscopy;  Laterality: N/A;   DILATION AND CURETTAGE OF UTERUS     11/15/2000   ENDOSCOPIC RETROGRADE CHOLANGIOPANCREATOGRAPHY (ERCP) WITH PROPOFOL N/A 09/14/2022   Procedure: ENDOSCOPIC RETROGRADE CHOLANGIOPANCREATOGRAPHY (ERCP) WITH PROPOFOL;  Surgeon: Meridee Score Netty Starring., MD;  Location: [facility];  Service: Gastroenterology;  Laterality: N/A;   ESOPHAGEAL MANOMETRY N/A 09/20/2015   Procedure: ESOPHAGEAL MANOMETRY (EM);  Surgeon: Ruffin Frederick, MD;  Location: [facility]COPY;  Service: Gastroenterology;  Laterality: N/A;   ESOPHAGOGASTRODUODENOSCOPY (EGD) WITH PROPOFOL N/A 05/17/2021   Procedure: ESOPHAGOGASTRODUODENOSCOPY (EGD) WITH PROPOFOL;  Surgeon: Pasty Spillers, MD;  Location: [facility] ENDOSCOPY;  Service: Endoscopy;  Laterality: N/A;   ESOPHAGOGASTRODUODENOSCOPY (EGD) WITH PROPOFOL N/A 07/17/2022   Procedure: ESOPHAGOGASTRODUODENOSCOPY (EGD) WITH PROPOFOL;   Surgeon: Wyline Mood, MD;  Location: [facility] ENDOSCOPY;  Service: Gastroenterology;  Laterality: N/A;   ESOPHAGOGASTRODUODENOSCOPY (EGD) WITH PROPOFOL N/A 09/14/2022   Procedure: ESOPHAGOGASTRODUODENOSCOPY (EGD) WITH PROPOFOL;  Surgeon: Meridee Score Netty Starring., MD;  Location: [facility];  Service: Gastroenterology;  Laterality: N/A;   EUS N/A 09/14/2022   Procedure: UPPER ENDOSCOPIC ULTRASOUND (EUS) RADIAL;  Surgeon: Lemar Lofty., MD;  Location: [facility] END[facility]COPY;  Service: Gastroenterology;  Laterality: N/A;   HYSTEROSCOPY  11/15/2000   D & C, resection of endometrial polyps   LUMBAR SPINAL CORD SIMULATOR LEAD REMOVAL N/A 08/01/2021   Procedure: REMOVAL OF SPINAL CORD STIMULATOR;  Surgeon: Venetia Night, MD;  Location: [facility] OR[facility];  Service: Neurosurgery;  Laterality: N/A;   OOPHORECTOMY  12/04/2000   bilateral salpingo-oophorectomy done with TAH   REMOVAL  OF STONES  09/14/2022   Procedure: REMOVAL OF STONES;  Surgeon: Meridee Score Netty Starring., MD;  Location: [facility];  Service: Gastroenterology;;   Dennison Mascot  09/14/2022   Procedure: Dennison Mascot;  Surgeon: Mansouraty, Netty Starring., MD;  Location: [facility];  Service: Gastroenterology;;   SPINAL CORD STIMULATOR INSERTION Bilateral 04/12/2020   Procedure: INSERTION CERVICAL SPINAL STIMULATOR PULSE GENERATOR;  Surgeon: Lucy Chris, MD;  Location: [facility] OR[facility];  Service: Neurosurgery;  Laterality: Bilateral;   SPINAL CORD STIMULATOR TRIAL N/A 03/22/2020   Procedure: CERVICAL SPINAL CORD STIMULATOR TRIAL PERCUTANEOUS;  Surgeon: Lucy Chris, MD;  Location: [facility] ORS;  Service: Neurosurgery;  Laterality: N/A;   TUBAL LIGATION      Family History  Problem Relation Age of Onset   Hypertension Father    Prostate cancer Father    Hypertension Sister    Lung cancer Sister    Diabetes Brother    Heart disease Brother    Hypertension Brother    Diabetes Brother    Heart disease Brother    Breast cancer Neg Hx     Allergies   Allergen Reactions   Alpha-Gal Anaphylaxis, Hives and Other (See Comments)    Alpha-gal Syndrome (AGS)   Milk (Cow) Anaphylaxis    Alpha-gal Syndrome (AGS)   Milk-Related Compounds Anaphylaxis and Other (See Comments)    Alpha-gal Syndrome (AGS)   Other Anaphylaxis and Other (See Comments)    NO ANIMAL-DERIVED PRODUCTS!! Alpha-gal Syndrome (AGS)   Bactrim [Sulfamethoxazole-Trimethoprim] Other (See Comments)    Mouth ulcers and sores    Current Outpatient Medications on File Prior to Visit  Medication Sig Dispense Refill   acetaminophen (TYLENOL) 325 MG tablet Take 325 mg by mouth 2 (two) times daily as needed (pain or headaches).     atorvastatin (LIPITOR) 10 MG tablet Take 1 tablet (10 mg total) by mouth daily. for cholesterol. 90 tablet 0   azelastine (ASTELIN) 0.1 % nasal spray Place 1 spray into both nostrils 2 (two) times daily. Use in each nostril as directed 30 mL 5   CALCIUM-VITAMIN D PO Take 2 tablets by mouth daily.     cetirizine (ZYRTEC ALLERGY) 10 MG tablet Take 1 tablet (10 mg total) by mouth daily. 30 tablet 5   dicyclomine (BENTYL) 10 MG capsule Take 1 capsule (10 mg total) by mouth 2 (two) times daily. 60 capsule 2   EPINEPHrine 0.3 mg/0.3 mL IJ SOAJ injection Inject 0.3 mg into the muscle as directed. 2 each 1   fluticasone (FLONASE) 50 MCG/ACT nasal spray Place 2 sprays into both nostrils daily as needed for allergies or rhinitis. 48 g 1   omeprazole (PRILOSEC) 40 MG capsule TAKE 1 CAPSULE TWICE DAILY FOR HEARTBURN (Patient taking differently: Take 40 mg by mouth 2 (two) times daily before a meal.) 180 capsule 0   sucralfate (CARAFATE) 1 g tablet Take 1 tablet (1 g total) by mouth 3 (three) times daily before meals. 90 tablet 2   Tenapanor HCl (IBSRELA) 50 MG TABS Take 1 tablet by mouth 2 (two) times daily. 60 tablet 0   topiramate (TOPAMAX) 50 MG tablet Take 1 tablet (50 mg total) by mouth daily. For headache prevention 90 tablet 2   No current facility-administered  medications on file prior to visit.    BP 130/76   Pulse 81   Temp 98 F (36.7 C) (Temporal)   Ht 5\' 4"  (1.626 m)   Wt 121 lb 3.2 oz (55 kg)   SpO2 98%   BMI 20.80 kg/m  Objective:   Physical Exam Constitutional:      General: She is not in acute distress. Pulmonary:     Effort: Pulmonary effort is normal.  Musculoskeletal:     Lumbar back: Tenderness present. No bony tenderness. Decreased range of motion. Negative right straight leg raise test and negative left straight leg raise test.       Back:     Comments: Soft tissue mass to right lower back as noted. Mobile, tender, measuring about 2 cm in diameter.   Skin:    General: Skin is warm and dry.  Neurological:     Mental Status: She is alert.           Assessment & Plan:  Localized skin mass, lump, or swelling Assessment & Plan: Exam consistent for cyst or lipoma.  Checking x-ray today as the mass is slightly lateral to lumbar spine. Consider soft tissue ultrasound if needed.   Chronic right-sided low back pain with right-sided sciatica Assessment & Plan: Acute on chronic flare with sciatica.  Start prednisone tablets. Take two tablets my mouth once daily in the morning for four days, then one tablet once daily in the morning for four days.  Start cyclobenzaprine 5 mg tablets.  Take 1 tablet by mouth every evening at bedtime as needed.  Drowsiness precautions provided.  Consider physical therapy. Lumbar plain films pending.  Orders: -     DG Lumbar Spine Complete        Doreene Nest, NP

## 2022-12-11 NOTE — Assessment & Plan Note (Signed)
Acute on chronic flare with sciatica.  Start prednisone tablets. Take two tablets my mouth once daily in the morning for four days, then one tablet once daily in the morning for four days.  Start cyclobenzaprine 5 mg tablets.  Take 1 tablet by mouth every evening at bedtime as needed.  Drowsiness precautions provided.  Consider physical therapy. Lumbar plain films pending.

## 2022-12-11 NOTE — Patient Instructions (Signed)
Start prednisone tablets. Take two tablets my mouth once daily in the morning for four days, then one tablet once daily in the morning for four days.   You may take the cyclobenzaprine muscle relaxer as needed for muscle spasms.  Take 1 tablet by mouth at bedtime.  This may cause drowsiness.  Complete xray(s) prior to leaving today. I will notify you of your results once received.  It was a pleasure to see you today!

## 2022-12-13 ENCOUNTER — Telehealth: Payer: Self-pay | Admitting: Primary Care

## 2022-12-13 DIAGNOSIS — G8929 Other chronic pain: Secondary | ICD-10-CM

## 2022-12-13 MED ORDER — PREDNISONE 20 MG PO TABS: ORAL_TABLET | ORAL | 0 refills | Status: DC

## 2022-12-13 MED ORDER — CYCLOBENZAPRINE HCL 5 MG PO TABS
5.0000 mg | ORAL_TABLET | Freq: Every evening | ORAL | 0 refills | Status: DC | PRN
Start: 2022-12-13 — End: 2023-04-06

## 2022-12-13 NOTE — Telephone Encounter (Signed)
Patient called in to follow up on x-ray. She stated that some medication was suppose to be called in for her as well. She wasn't sure if you was waiting on the results from x-ray.

## 2022-12-13 NOTE — Telephone Encounter (Signed)
X-ray results still pending.  Looks like prednisone and cyclobenzaprine was suppose to have been sent in.  Will forward message to Jae Dire to send in prescriptions.

## 2022-12-13 NOTE — Telephone Encounter (Signed)
Noted.  Please apologize on my behalf.  I forgot to send in the prednisone and cyclobenzaprine last visit.  Both prescriptions have been sent to her pharmacy.  Have her start the prednisone tomorrow morning.  She can take the cyclobenzaprine tonight.

## 2022-12-14 NOTE — Telephone Encounter (Signed)
Called patient gave information she will pick up meds and start as directed. She will call If any questions.

## 2022-12-19 IMAGING — CT CT ABD-PELV W/ CM
2 of 5 series · 17 of 46 positions shown, 19 images · IV contrast (agent unspecified)
Comparison: 02/15/2021

CLINICAL DATA: Abdominal pain, acute, nonlocalized

EXAM:
CT ABDOMEN AND PELVIS WITH CONTRAST
TECHNIQUE: Multidetector CT imaging of the abdomen and pelvis was performed
using the standard protocol following bolus administration of
intravenous contrast.

[Series 3: a/p w/ 5mm · axial · 0.79mm/px · z∈[+767,+1182]mm · 14 of 93 slices shown, 16 images]
[im 5/93  soft-tissue]
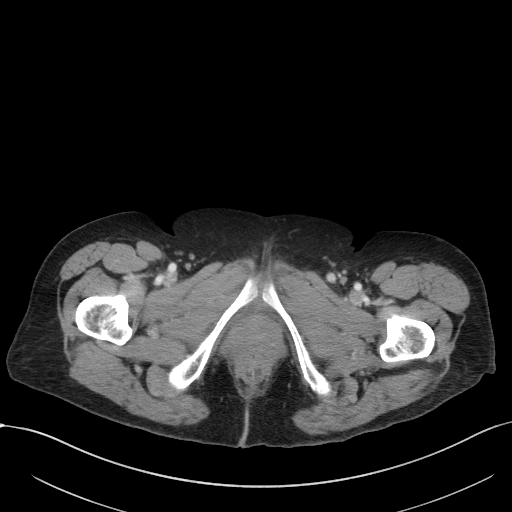
[im 5/93  bone]
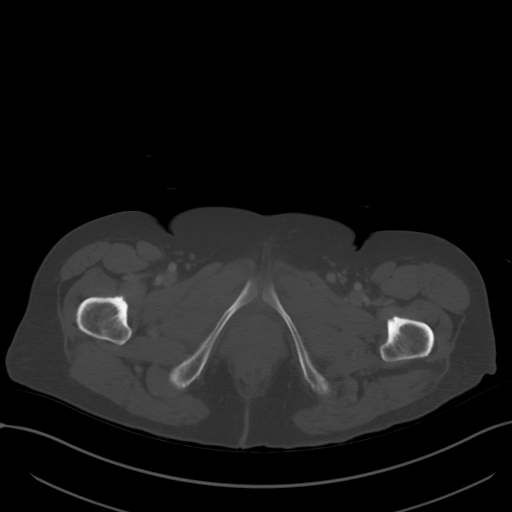
[im 10/93  soft-tissue]
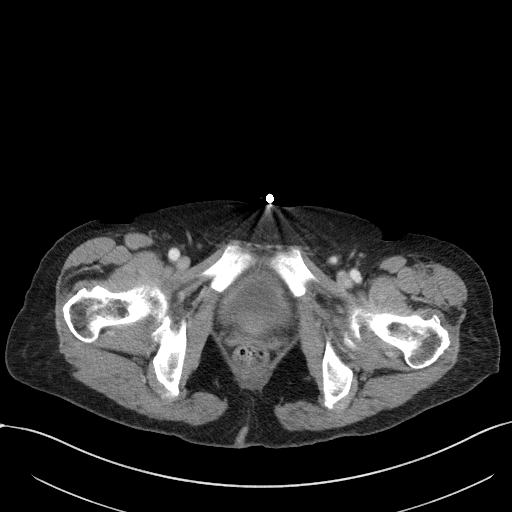
[im 20/93  soft-tissue]
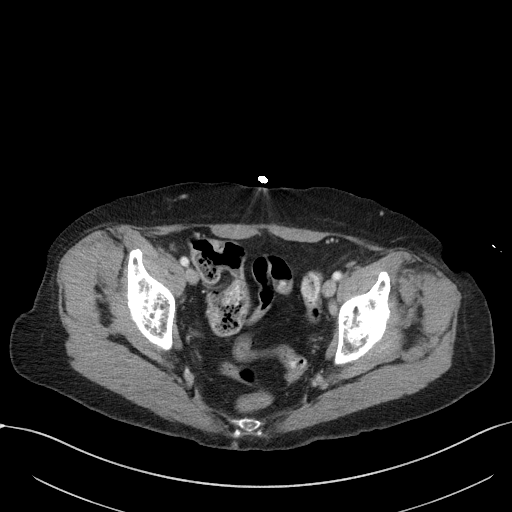
[im 25/93  soft-tissue]
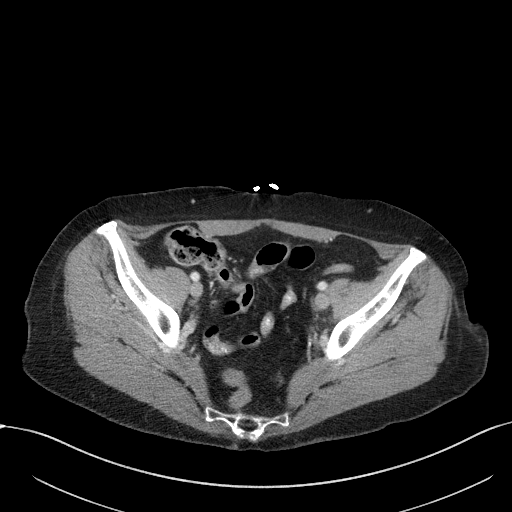
[im 30/93  soft-tissue]
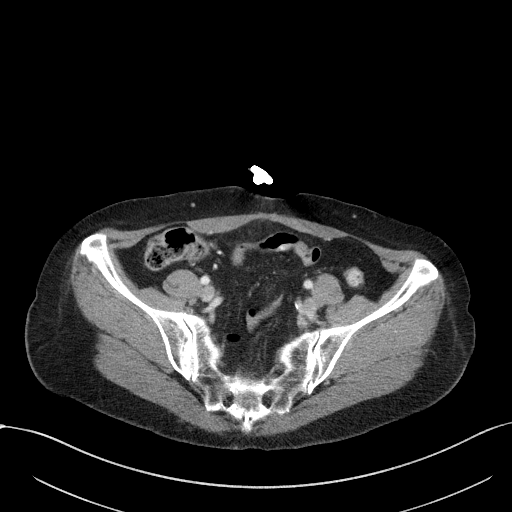
[im 39/93  soft-tissue]
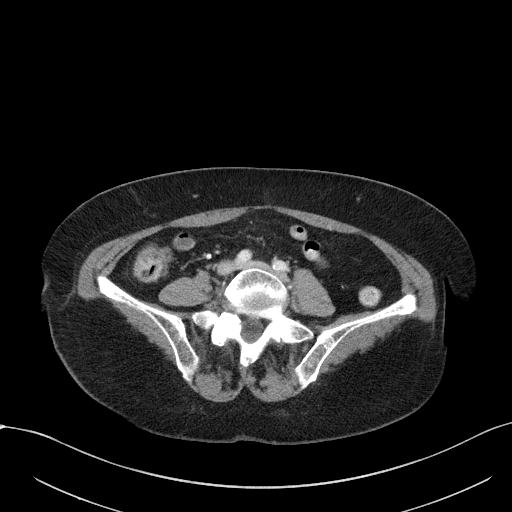
[im 44/93  soft-tissue]
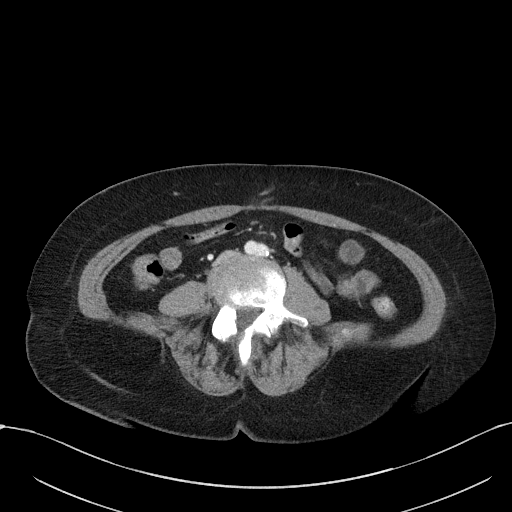
[im 49/93  soft-tissue]
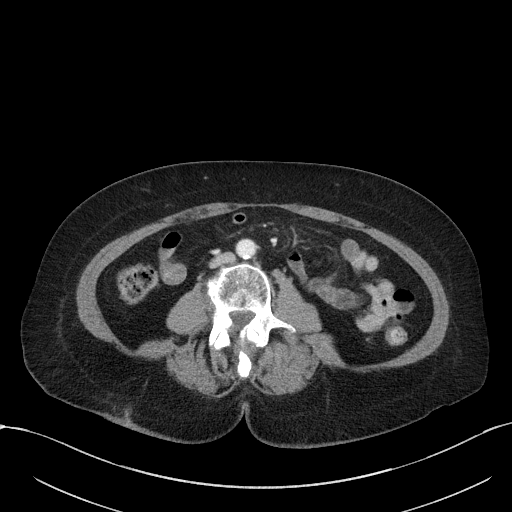
[im 54/93  soft-tissue]
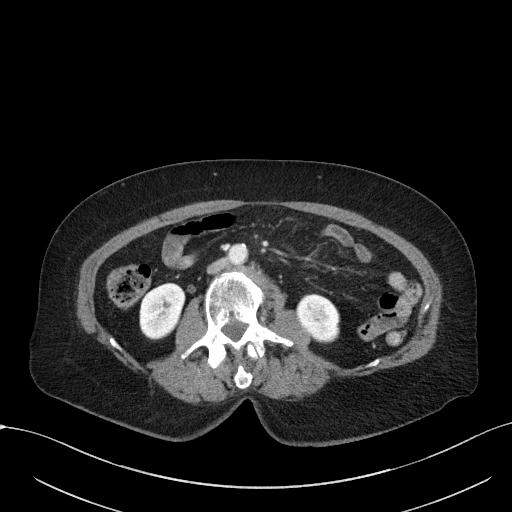
[im 54/93  bone]
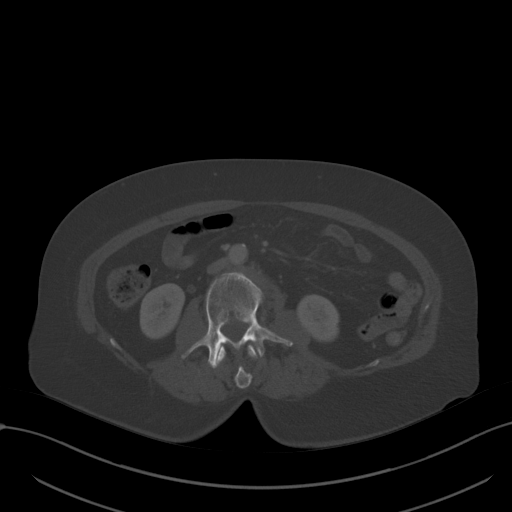
[im 63/93  soft-tissue]
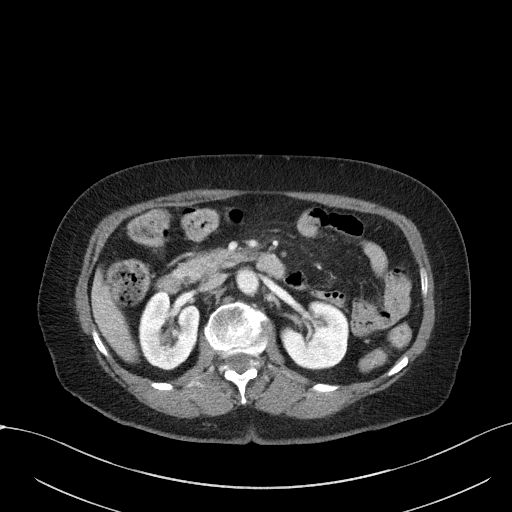
[im 68/93  soft-tissue]
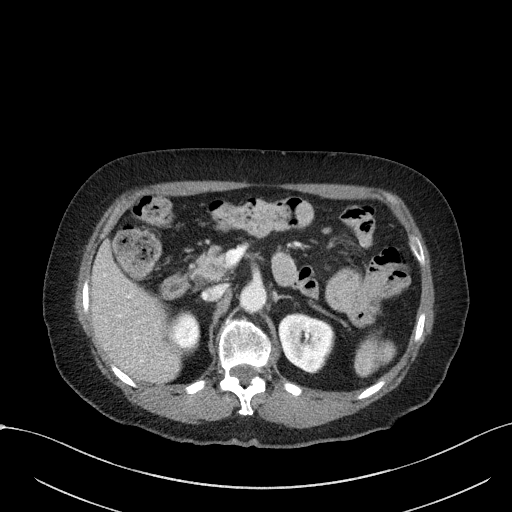
[im 73/93  soft-tissue]
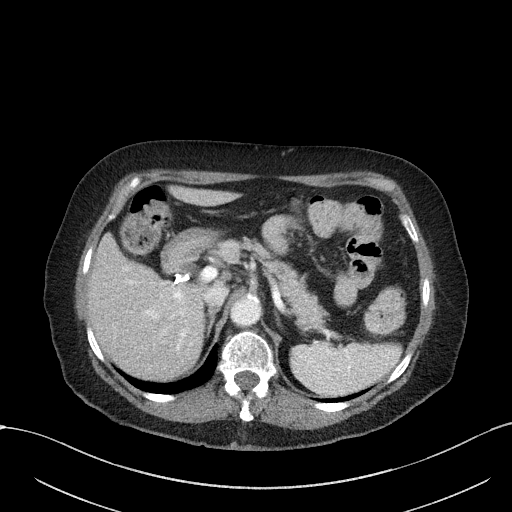
[im 83/93  soft-tissue]
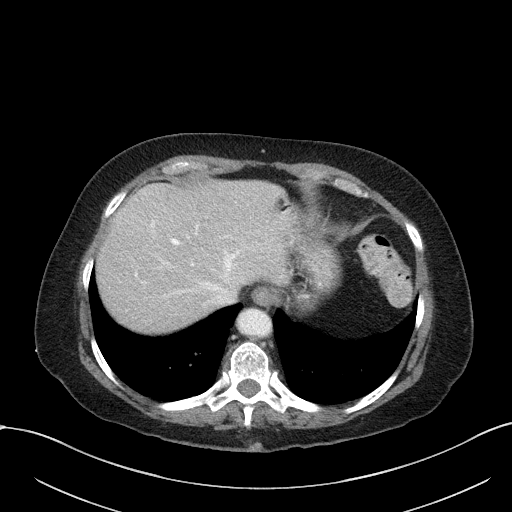
[im 88/93  soft-tissue]
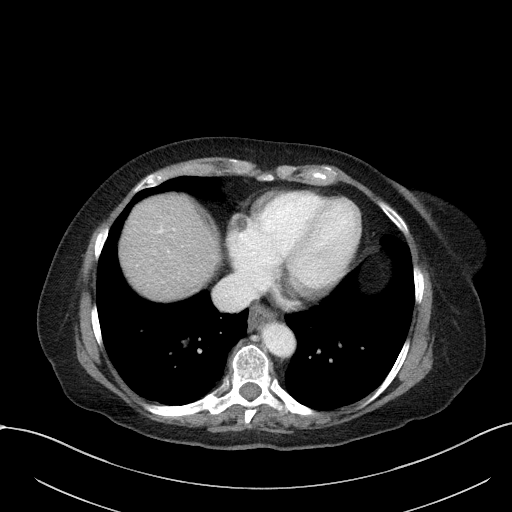

[Series 6: a/p w/ cor · coronal · 0.85mm/px · 3 of 138 slices shown]
[im 46/138  soft-tissue]
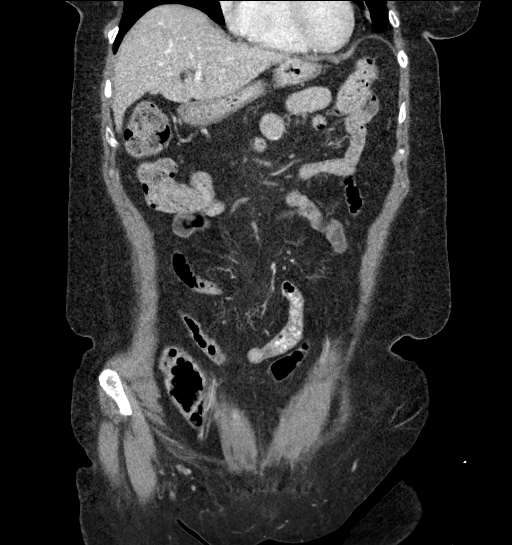
[im 61/138  soft-tissue]
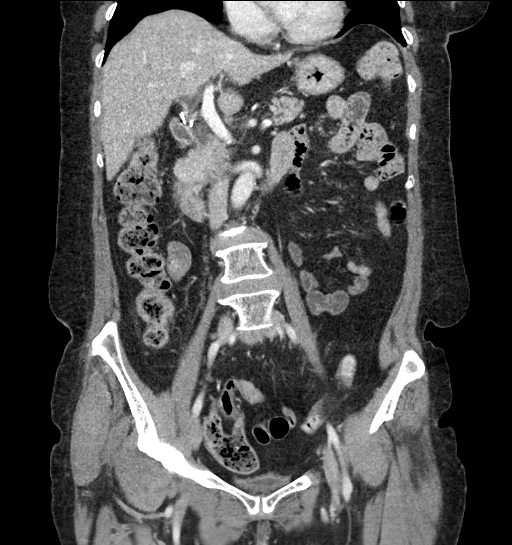
[im 77/138  soft-tissue]
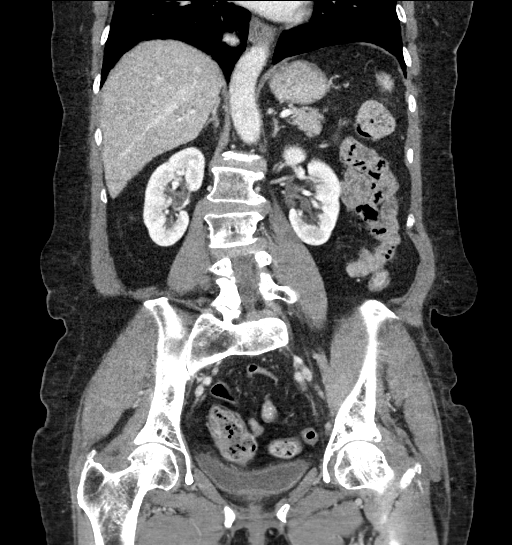

[17 of 46 positions shown; findings below may reference images not displayed]

RADIATION DOSE REDUCTION: This exam was performed according to the
departmental dose-optimization program which includes automated
exposure control, adjustment of the mA and/or kV according to
patient size and/or use of iterative reconstruction technique.

CONTRAST:  100mL OMNIPAQUE IOHEXOL 350 MG/ML SOLN
FINDINGS: Lower chest: No acute abnormality.

Hepatobiliary: No focal liver abnormality is seen. Status post
cholecystectomy. No biliary dilatation.

Pancreas: Unremarkable. No pancreatic ductal dilatation or
surrounding inflammatory changes.

Spleen: Unremarkable.

Adrenals/Urinary Tract: Adrenals are unremarkable. Kidneys are
unremarkable. The bladder is poorly distended but unremarkable.

Stomach/Bowel: Stomach is within normal limits. Bowel is normal in
caliber. Normal appendix.

Vascular/Lymphatic: Mild atherosclerosis.  No enlarged nodes.

Reproductive: Status post hysterectomy. No adnexal masses.

Other: No free fluid.  Abdominal wall is unremarkable.

Musculoskeletal: Lumbar spine degenerative changes superimposed on
dextrocurvature. Spinal stimulator is no longer present.
IMPRESSION: No acute abnormality.

## 2022-12-19 IMAGING — CR DG FEMUR 2+V*L*
4 series · 4 of 4 positions shown · non-contrast
Comparison: None.

CLINICAL DATA: Left femur pain.

EXAM:
LEFT FEMUR 2 VIEWS

[femur ap (1 of 2)]
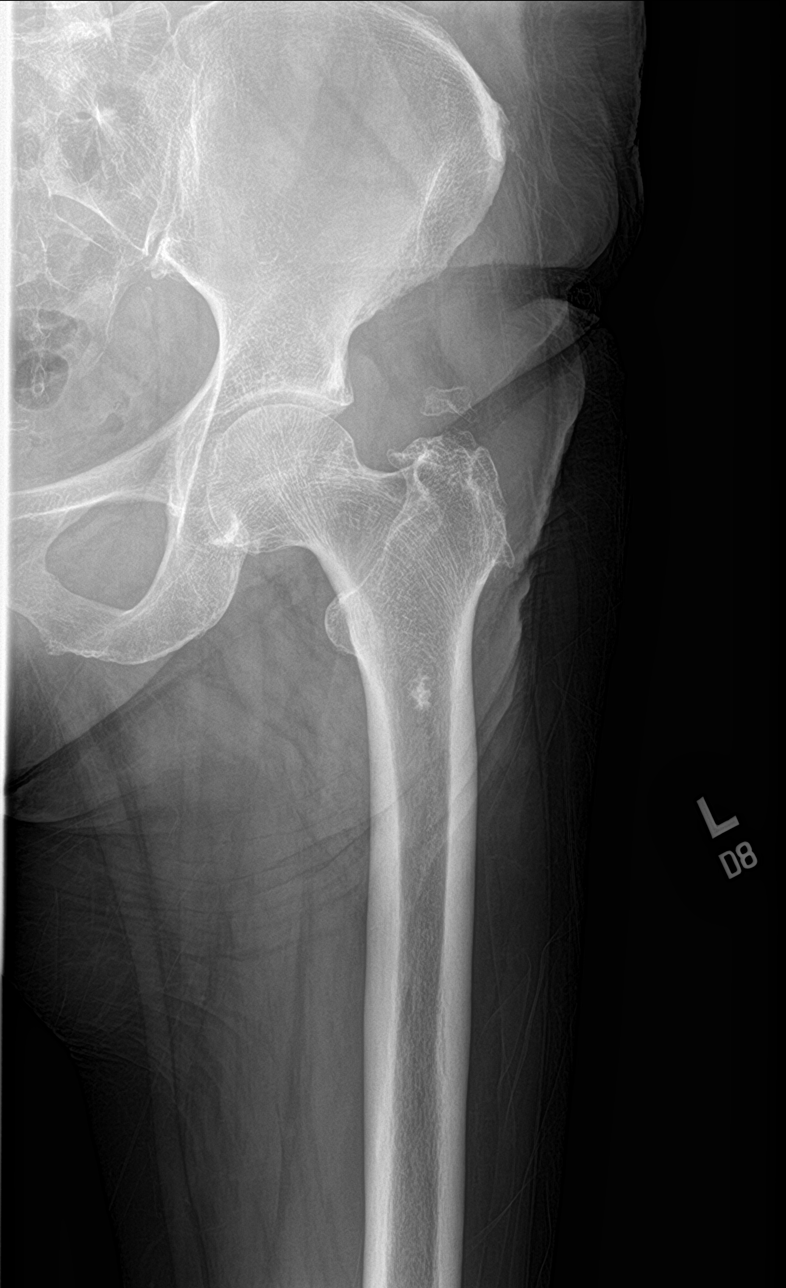

[femur ap (2 of 2)]
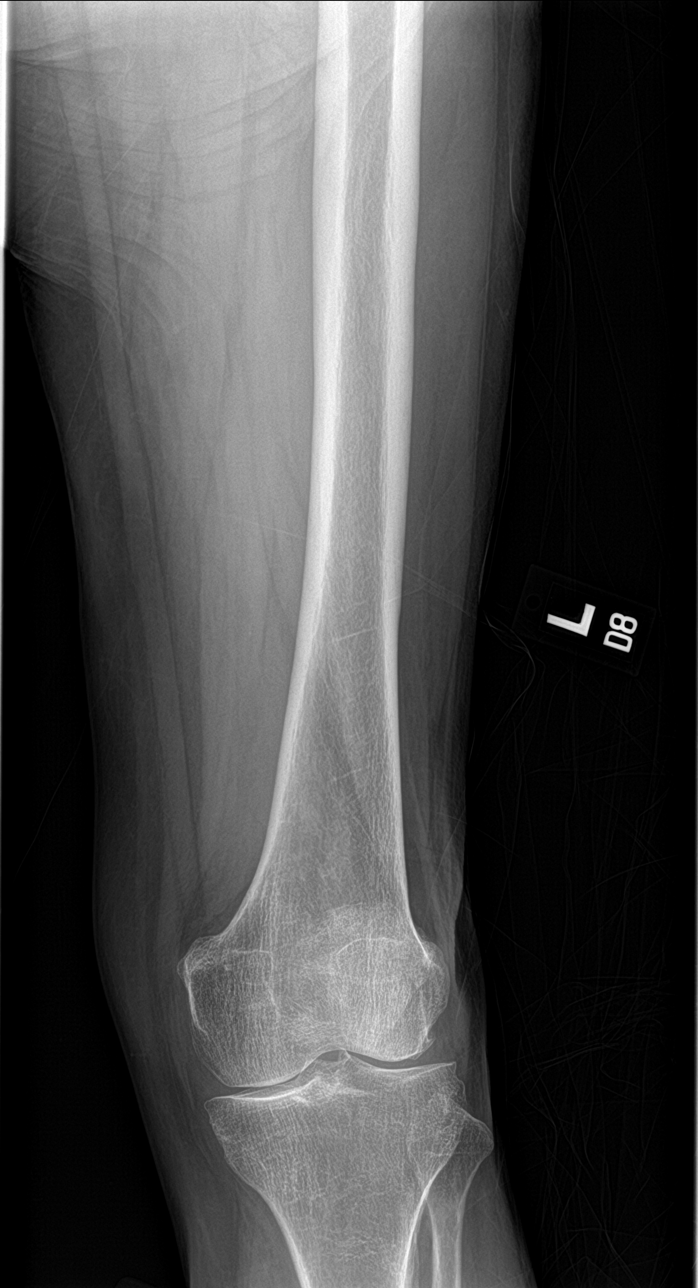

[femur lat (1 of 2)]
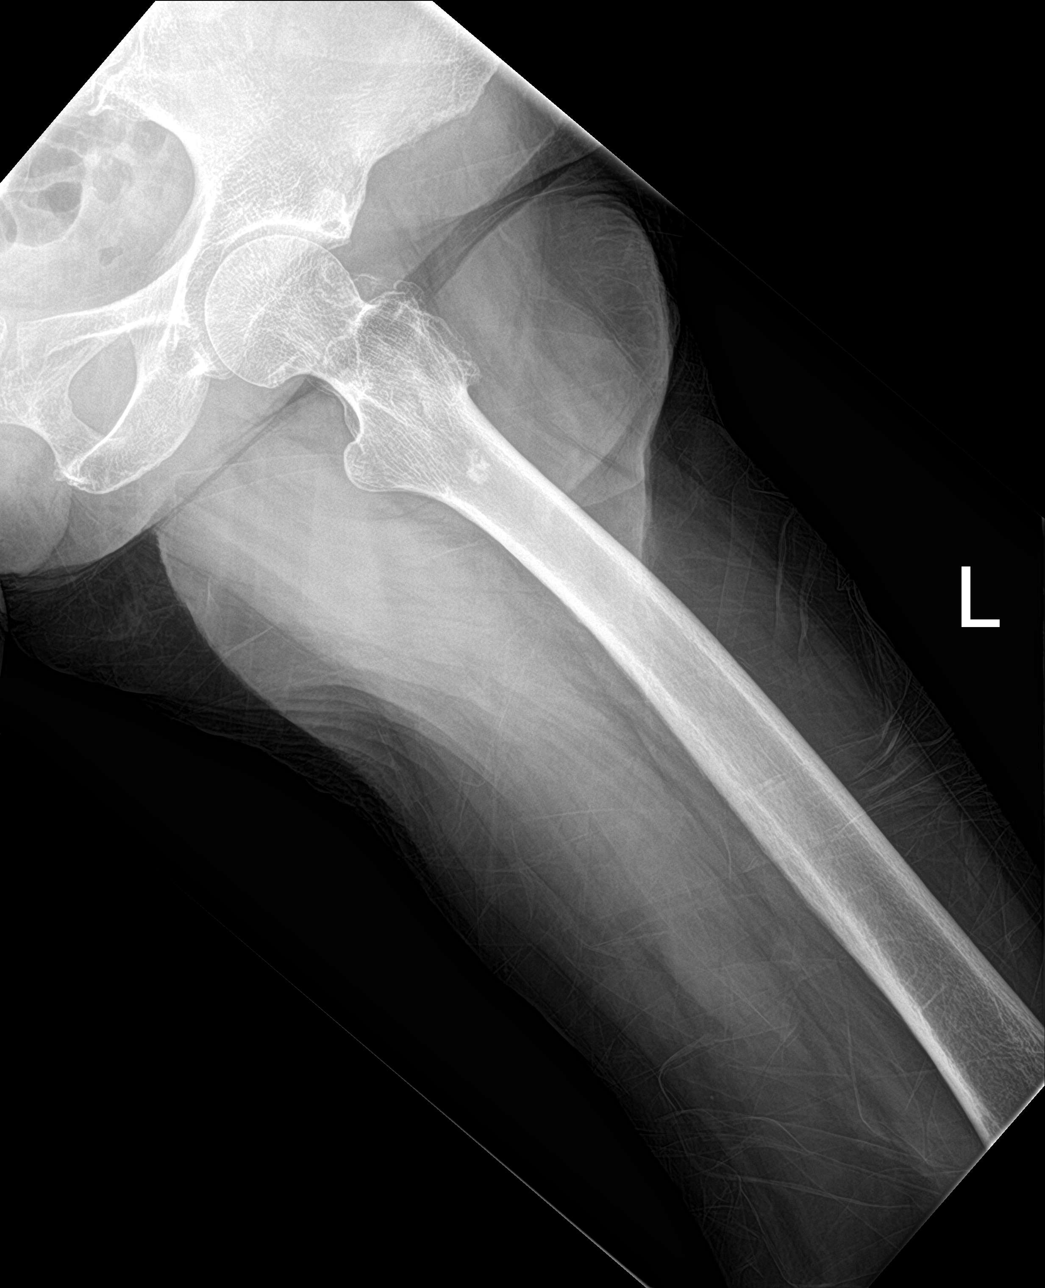

[femur lat (2 of 2)]
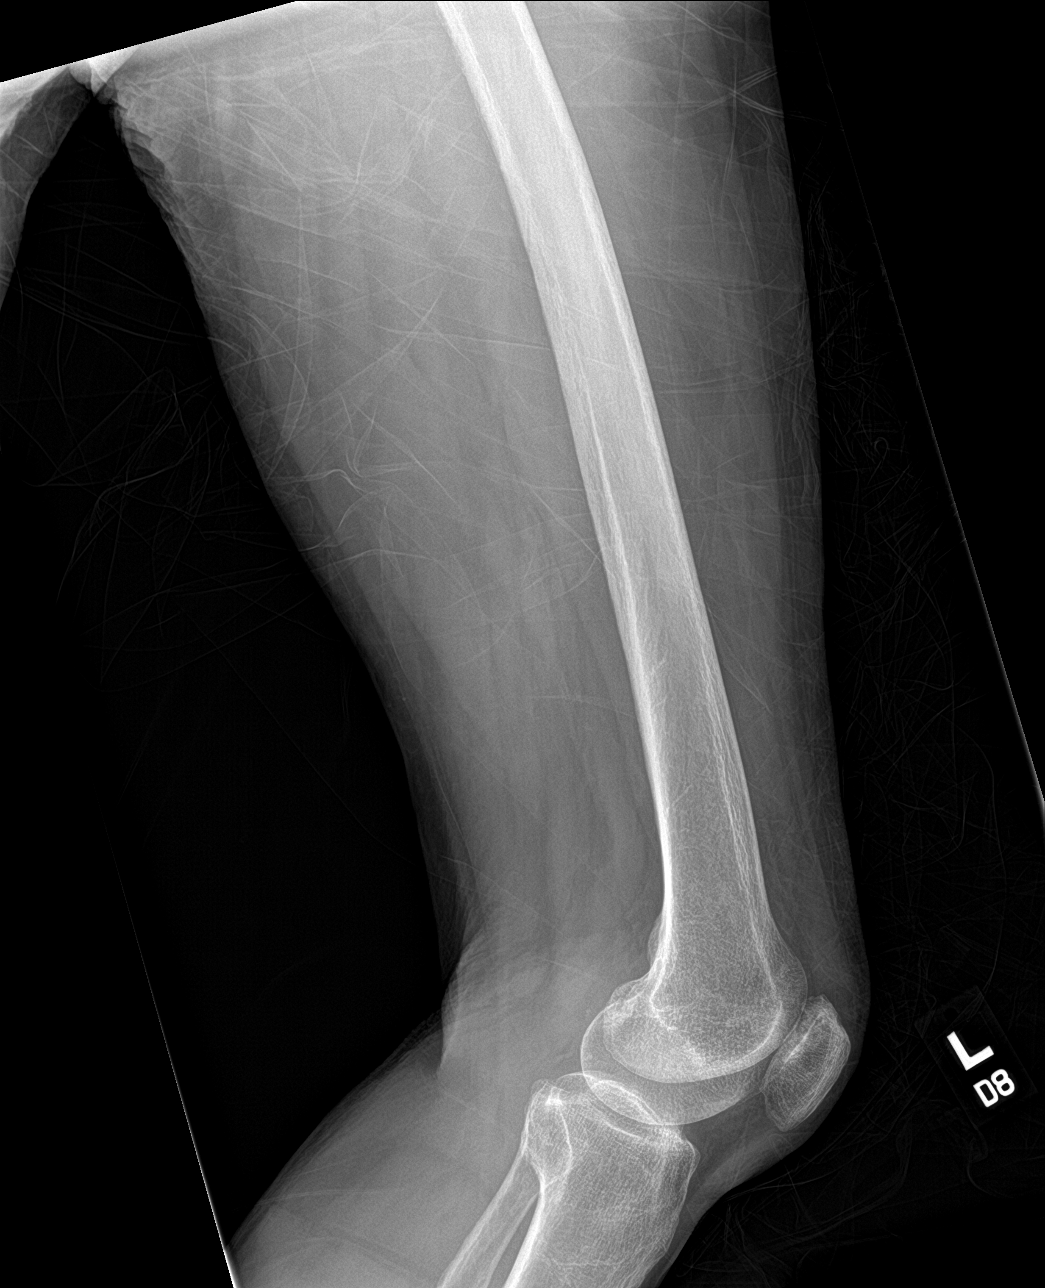

[4 of 4 positions shown; findings below may reference images not displayed]

FINDINGS: There is no evidence of fracture. Small benign bone island
incidentally noted in the proximal left femoral diaphysis. No other
bone lesions identified. Soft tissues are unremarkable.
IMPRESSION: No acute findings.

## 2023-01-03 ENCOUNTER — Telehealth: Payer: Self-pay | Admitting: Physician Assistant

## 2023-01-03 NOTE — Progress Notes (Signed)
Celso Amy, PA-C [address]  [address]  [place], [place] [address]  Main: [phone]  Fax: [phone]   Primary Care Physician: Doreene Nest, NP  Primary Gastroenterologist:  Celso Amy, PA-C / Dr. Wyline Mood    CC: F/U Chronic Constipation, IBS, GERD, Nausea, Abd Pain.  HPI: Valerie [name] is a 72 y.o. female returns for f/u.  1 month ago she was given samples of Ibsrela 50 mg twice daily.  Continue omeprazole 40 Mg twice daily.  Added Carafate 1 g 3 times daily and dicyclomine 10 Mg twice daily. Failed Linzess and Trulance. Has taken MiraLAX 1 capful once daily and Senokot 2 tablets once daily.     Current symptoms:  Multiple chronic GI symptoms including episodes of abdominal pain, nausea, vomiting, early satiety, and chronic constipation for many years.  History of GERD with esophagitis since 2005.  Has alpha gal and milk allergy.  Cholecystectomy in 1997.    GI Workup:  -Gastric emptying study 11/17/2022: Normal. -EGD 06/2022 by Dr. Tobi Bastos was Normal.  Gastric biopsies negative for H. pylori.  -Abdominal MRI / MRCP 08/2022 showed sludge in the bile duct post cholecystectomy.   -ERCP & EUS by Dr. Meridee Score 09/14/2022 showed 1 cm hiatal hernia, mild gastritis, and sludge / debri removed from biliary and pancreatic duct..  Normal pancreas.  -CT abdomen pelvis with contrast 09/15/2022 showed mild hyperdensity in the distal common bile duct, likely reflecting sludge post recent ERCP.  Otherwise no acute abnormality.  No masses.  No bowel inflammation.  Previous hysterectomy, appendectomy, and cholecystectomy.  -Labs 11/28/2022 showed normal CBC, CMP, and lipase.  Normal LFTs. -Colonoscopy 07/2020 normal.  Excellent prep.  Small internal hemorrhoids.  Repeat in 10 years. -24 hour pH manometry test in 2017 on PPI which was normal, suggesting functional heartburn - no pathologic acid reflux.   Current Outpatient Medications  Medication Sig Dispense Refill   acetaminophen  (TYLENOL) 325 MG tablet Take 325 mg by mouth 2 (two) times daily as needed (pain or headaches).     atorvastatin (LIPITOR) 10 MG tablet Take 1 tablet (10 mg total) by mouth daily. for cholesterol. 90 tablet 0   azelastine (ASTELIN) 0.1 % nasal spray Place 1 spray into both nostrils 2 (two) times daily. Use in each nostril as directed 30 mL 5   CALCIUM-VITAMIN D PO Take 2 tablets by mouth daily.     cetirizine (ZYRTEC ALLERGY) 10 MG tablet Take 1 tablet (10 mg total) by mouth daily. 30 tablet 5   cyclobenzaprine (FLEXERIL) 5 MG tablet Take 1 tablet (5 mg total) by mouth at bedtime as needed for muscle spasms. 15 tablet 0   dicyclomine (BENTYL) 10 MG capsule Take 1 capsule (10 mg total) by mouth 2 (two) times daily. 60 capsule 2   EPINEPHrine 0.3 mg/0.3 mL IJ SOAJ injection Inject 0.3 mg into the muscle as directed. 2 each 1   fluticasone (FLONASE) 50 MCG/ACT nasal spray Place 2 sprays into both nostrils daily as needed for allergies or rhinitis. 48 g 1   omeprazole (PRILOSEC) 40 MG capsule TAKE 1 CAPSULE TWICE DAILY FOR HEARTBURN (Patient taking differently: Take 40 mg by mouth 2 (two) times daily before a meal.) 180 capsule 0   predniSONE (DELTASONE) 20 MG tablet Take two tablets my mouth once daily in the morning for four days, then one tablet once daily in the morning for four days. 12 tablet 0   sucralfate (CARAFATE) 1 g tablet Take 1  tablet (1 g total) by mouth 3 (three) times daily before meals. 90 tablet 2   topiramate (TOPAMAX) 50 MG tablet Take 1 tablet (50 mg total) by mouth daily. For headache prevention 90 tablet 2   No current facility-administered medications for this visit.    Allergies as of 01/04/2023 - Review Complete 12/11/2022  Allergen Reaction Noted   Alpha-gal Anaphylaxis, Hives, and Other (See Comments) 09/15/2022   Milk (cow) Anaphylaxis 09/15/2022   Milk-related compounds Anaphylaxis and Other (See Comments) 09/15/2022   Other Anaphylaxis and Other (See Comments)  09/15/2022   Bactrim [sulfamethoxazole-trimethoprim] Other (See Comments) 03/22/2022    Past Medical History:  Diagnosis Date   Acid reflux    Acute cystitis with hematuria 12/08/2020   Adenocarcinoma of the endometrium/uterus (HCC) 12/04/2000   Anal fissure    Anxiety    Atrophic vaginitis    COVID-19 virus infection 09/28/2020   Dehydration    Difficult intubation    limited neck mobility   Dysuria 08/13/2020   Esophagitis    Hernia    Present to proximal to umbillical    History of hiatal hernia    Hyperlipidemia    Hypertension    Hypokalemia 09/15/2022   Internal hemorrhoids    Intractable abdominal pain 09/15/2022   Lactic acidosis    Menorrhagia    Osteopenia    Partial small bowel obstruction (HCC) 01/26/2021   PONV (postoperative nausea and vomiting)    Recurrent UTI    Vertigo    Vitamin D deficiency     Past Surgical History:  Procedure Laterality Date   53 HOUR PH STUDY N/A 11/29/2015   Procedure: 24 HOUR PH STUDY;  Surgeon: Ruffin Frederick, MD;  Location: [facility];  Service: Gastroenterology;  Laterality: N/A;   ABDOMINAL HYSTERECTOMY  12/04/2000   also bilateral salpingo-oophorectomy   CARDIAC CATHETERIZATION  2006   CARPAL TUNNEL RELEASE  11/20/2011   Procedure: CARPAL TUNNEL RELEASE;  Surgeon: Mariam Dollar, MD;  Location: [facility];  Service: Neurosurgery;  Laterality: Left;  LEFT carpal tunnel release   CERVICAL SPINE SURGERY  2013   [facility]   CHOLECYSTECTOMY  1997   COLONOSCOPY WITH PROPOFOL N/A 07/27/2020   Procedure: COLONOSCOPY WITH PROPOFOL;  Surgeon: Midge Minium, MD;  Location: [facility] ENDOSCOPY;  Service: Endoscopy;  Laterality: N/A;   DILATION AND CURETTAGE OF UTERUS     11/15/2000   ENDOSCOPIC RETROGRADE CHOLANGIOPANCREATOGRAPHY (ERCP) WITH PROPOFOL N/A 09/14/2022   Procedure: ENDOSCOPIC RETROGRADE CHOLANGIOPANCREATOGRAPHY (ERCP) WITH PROPOFOL;  Surgeon: Meridee Score Netty Starring., MD;  Location: [facility];  Service: Gastroenterology;   Laterality: N/A;   ESOPHAGEAL MANOMETRY N/A 09/20/2015   Procedure: ESOPHAGEAL MANOMETRY (EM);  Surgeon: Ruffin Frederick, MD;  Location: [facility];  Service: Gastroenterology;  Laterality: N/A;   ESOPHAGOGASTRODUODENOSCOPY (EGD) WITH PROPOFOL N/A 05/17/2021   Procedure: ESOPHAGOGASTRODUODENOSCOPY (EGD) WITH PROPOFOL;  Surgeon: Pasty Spillers, MD;  Location: [facility] ENDOSCOPY;  Service: Endoscopy;  Laterality: N/A;   ESOPHAGOGASTRODUODENOSCOPY (EGD) WITH PROPOFOL N/A 07/17/2022   Procedure: ESOPHAGOGASTRODUODENOSCOPY (EGD) WITH PROPOFOL;  Surgeon: Wyline Mood, MD;  Location: [facility] ENDOSCOPY;  Service: Gastroenterology;  Laterality: N/A;   ESOPHAGOGASTRODUODENOSCOPY (EGD) WITH PROPOFOL N/A 09/14/2022   Procedure: ESOPHAGOGASTRODUODENOSCOPY (EGD) WITH PROPOFOL;  Surgeon: Meridee Score Netty Starring., MD;  Location: [facility];  Service: Gastroenterology;  Laterality: N/A;   EUS N/A 09/14/2022   Procedure: UPPER ENDOSCOPIC ULTRASOUND (EUS) RADIAL;  Surgeon: Lemar Lofty., MD;  Location: [facility];  Service: Gastroenterology;  Laterality: N/A;   HYSTEROSCOPY  11/15/2000  D & C, resection of endometrial polyps   LUMBAR SPINAL CORD SIMULATOR LEAD REMOVAL N/A 08/01/2021   Procedure: REMOVAL OF SPINAL CORD STIMULATOR;  Surgeon: Venetia Night, MD;  Location: [facility] ORS;  Service: Neurosurgery;  Laterality: N/A;   OOPHORECTOMY  12/04/2000   bilateral salpingo-oophorectomy done with TAH   REMOVAL OF STONES  09/14/2022   Procedure: REMOVAL OF STONES;  Surgeon: Meridee Score Netty Starring., MD;  Location: [facility];  Service: Gastroenterology;;   Dennison Mascot  09/14/2022   Procedure: Dennison Mascot;  Surgeon: Mansouraty, Netty Starring., MD;  Location: [facility];  Service: Gastroenterology;;   SPINAL CORD STIMULATOR INSERTION Bilateral 04/12/2020   Procedure: INSERTION CERVICAL SPINAL STIMULATOR PULSE GENERATOR;  Surgeon: Lucy Chris, MD;  Location: [facility] ORS;  Service: Neurosurgery;   Laterality: Bilateral;   SPINAL CORD STIMULATOR TRIAL N/A 03/22/2020   Procedure: CERVICAL SPINAL CORD STIMULATOR TRIAL PERCUTANEOUS;  Surgeon: Lucy Chris, MD;  Location: [facility] ORS;  Service: Neurosurgery;  Laterality: N/A;   TUBAL LIGATION      Review of Systems:    All systems reviewed and negative except where noted in HPI.   Physical Examination:   There were no vitals taken for this visit.  General: Well-nourished, well-developed in no acute distress.  Eyes: No icterus. Conjunctivae pink. Mouth: Oropharyngeal mucosa moist and pink , no lesions erythema or exudate. Lungs: Clear to auscultation bilaterally. Non-labored. Heart: Regular rate and rhythm, no murmurs rubs or gallops.  Abdomen: Bowel sounds are normal; Abdomen is Soft; No hepatosplenomegaly, masses or hernias;  No Abdominal Tenderness; No guarding or rebound tenderness. Extremities: No lower extremity edema. No clubbing or deformities. Neuro: Alert and oriented x 3.  Grossly intact. Skin: Warm and dry, no jaundice.   Psych: Alert and cooperative, normal mood and affect.   Imaging Studies: DG Lumbar Spine Complete  Result Date: 12/16/2022 CLINICAL DATA:  acute on chronic back pain EXAM: LUMBAR SPINE - COMPLETE 4+ VIEW COMPARISON:  September 15, 2022 FINDINGS: Osteopenia. There are five non-rib bearing lumbar-type vertebral bodies. Dextrocurvature of the lumbar spine with asymmetric multilevel disc space height loss. LEFT superior pelvic tilt. Mild retrolisthesis of L3-4. There is no definitive acute compression fracture deformity. Moderate multilevel intervertebral disc space height loss, most pronounced at the upper lumbar spine. Multilevel endplate proliferative changes. Facet arthropathy. Atherosclerotic calcifications. Moderate colonic stool burden. Sacrum is obscured by overlapping bowel contents. IMPRESSION: Multilevel degenerative changes. No definitive acute compression fracture deformity. If persistent clinical concern  for acute fracture, recommend dedicated cross-sectional imaging. Electronically Signed   By: Meda Klinefelter M.D.   On: 12/16/2022 14:45    Assessment and Plan:   Valerie [name] is a 72 y.o. y/o female  returns for follow-up of multiple chronic GI symptoms including abdominal pain, chronic constipation, nausea, vomiting, and acid reflux.  Cholecystectomy in 1997.  Underwent ERCP 08/2022 with sludge and debris removed from the biliary and pancreatic ducts.  Labs 11/2022 showed LFTs were normal.  EUS and abdominal MRI showed normal pancreas.  Normal EGD 06/2022.  Normal colonoscopy 07/2020.  Recent gastric emptying test was normal.  Failed treatment with Linzess and Trulance.  Most recently tried [place].  Took MiraLAX and Senokot in the past.  Recently tried Carafate and dicyclomine.   1.  Chronic constipation / IBS-C             Continue Ibsrella  Miralax / Senokot  Lactulose  Xifaxan  Ibgard  2.  Chronic GERD / Functional Heartburn  Continue omeprazole 40 Mg twice daily.             Continue Carafate 1 g 3 times daily before meals.  Try FD Guard?             If symptoms persist, consider adding baclofen for refractory GERD in the future.               3.  Choledocholithiasis status post ERCP 08/2022 with sludge and debris removed from bile duct.  Cholecystectomy in 1997.   4.  Chronic nausea; suspect functional dyspepsia             Uncertain etiology.  Extensive negative GI workup including normal EGD, negative GES.  Try FD Guard?   5.  Chronic abdominal pain(upper and lower) -  IBS-C            Continue Ibsrella            Continue dicyclomine10mg  BID.    6.  Colon cancer screening             Normal colonoscopy 07/2020.  10-year repeat pending health status.   7.  Weight loss - Stable; abdominal pelvic CT 08/2022 showed no evidence of cancer or masses.    Celso Amy, PA-C  Follow up ***  BP check ***

## 2023-01-03 NOTE — Telephone Encounter (Signed)
Called the patient to confirm his appointment and co pay for Monday 01/03/23 with Inetta Fermo. Patient said he will be here.

## 2023-01-04 ENCOUNTER — Encounter: Payer: Self-pay | Admitting: Physician Assistant

## 2023-01-04 ENCOUNTER — Ambulatory Visit: Payer: Medicare HMO | Admitting: Physician Assistant

## 2023-01-04 VITALS — BP 130/78 | HR 80 | Temp 98.2°F | Ht 65.0 in | Wt 119.8 lb

## 2023-01-04 DIAGNOSIS — R1084 Generalized abdominal pain: Secondary | ICD-10-CM

## 2023-01-04 DIAGNOSIS — K2100 Gastro-esophageal reflux disease with esophagitis, without bleeding: Secondary | ICD-10-CM

## 2023-01-04 DIAGNOSIS — R110 Nausea: Secondary | ICD-10-CM | POA: Diagnosis not present

## 2023-01-04 DIAGNOSIS — R634 Abnormal weight loss: Secondary | ICD-10-CM | POA: Diagnosis not present

## 2023-01-04 DIAGNOSIS — K581 Irritable bowel syndrome with constipation: Secondary | ICD-10-CM | POA: Diagnosis not present

## 2023-01-04 DIAGNOSIS — R9389 Abnormal findings on diagnostic imaging of other specified body structures: Secondary | ICD-10-CM

## 2023-01-04 MED ORDER — DICYCLOMINE HCL 10 MG PO CAPS
10.0000 mg | ORAL_CAPSULE | Freq: Three times a day (TID) | ORAL | 5 refills | Status: DC
Start: 2023-01-04 — End: 2023-07-02

## 2023-01-04 MED ORDER — TENAPANOR HCL 50 MG PO TABS
50.0000 mg | ORAL_TABLET | Freq: Two times a day (BID) | ORAL | 5 refills | Status: AC
Start: 2023-01-04 — End: 2023-02-03

## 2023-01-04 MED ORDER — ONDANSETRON HCL 4 MG PO TABS
4.0000 mg | ORAL_TABLET | Freq: Three times a day (TID) | ORAL | 3 refills | Status: DC | PRN
Start: 2023-01-04 — End: 2023-06-11

## 2023-01-04 NOTE — Patient Instructions (Addendum)
MRI scheduled at [facility] on 01/11/23 @ 7:45. Nothing to eat/drink after midnight.

## 2023-01-10 ENCOUNTER — Encounter: Payer: Self-pay | Admitting: Internal Medicine

## 2023-01-10 ENCOUNTER — Ambulatory Visit (INDEPENDENT_AMBULATORY_CARE_PROVIDER_SITE_OTHER): Payer: Medicare HMO | Admitting: Internal Medicine

## 2023-01-10 VITALS — BP 120/80 | HR 76 | Temp 97.2°F | Ht 65.0 in | Wt 120.0 lb

## 2023-01-10 DIAGNOSIS — N3000 Acute cystitis without hematuria: Secondary | ICD-10-CM | POA: Insufficient documentation

## 2023-01-10 HISTORY — DX: Acute cystitis without hematuria: N30.00

## 2023-01-10 LAB — POC URINALSYSI DIPSTICK (AUTOMATED)
Bilirubin, UA: NEGATIVE
Glucose, UA: NEGATIVE
Ketones, UA: NEGATIVE
Nitrite, UA: POSITIVE
Protein, UA: NEGATIVE
Spec Grav, UA: 1.02 (ref 1.010–1.025)
Urobilinogen, UA: 1 E.U./dL
pH, UA: 6 (ref 5.0–8.0)

## 2023-01-10 MED ORDER — NITROFURANTOIN MONOHYD MACRO 100 MG PO CAPS: 100.0000 mg | ORAL_CAPSULE | Freq: Two times a day (BID) | ORAL | 0 refills | Status: DC

## 2023-01-10 NOTE — Progress Notes (Signed)
Subjective:    Patient ID: Valerie [name], female    DOB: 1951/03/13, 72 y.o.   MRN: [id number]  HPI Here due to urinary symptoms  Over a week--some urinary incontinence and urgency Strong odor No real dysuria--but has pressure sensation ?slight blood  Didn't try anything  Current Outpatient Medications on File Prior to Visit  Medication Sig Dispense Refill   acetaminophen (TYLENOL) 325 MG tablet Take 325 mg by mouth 2 (two) times daily as needed (pain or headaches).     atorvastatin (LIPITOR) 10 MG tablet Take 1 tablet (10 mg total) by mouth daily. for cholesterol. 90 tablet 0   azelastine (ASTELIN) 0.1 % nasal spray Place 1 spray into both nostrils 2 (two) times daily. Use in each nostril as directed 30 mL 5   CALCIUM-VITAMIN D PO Take 2 tablets by mouth daily.     cetirizine (ZYRTEC ALLERGY) 10 MG tablet Take 1 tablet (10 mg total) by mouth daily. 30 tablet 5   cyclobenzaprine (FLEXERIL) 5 MG tablet Take 1 tablet (5 mg total) by mouth at bedtime as needed for muscle spasms. 15 tablet 0   dicyclomine (BENTYL) 10 MG capsule Take 1 capsule (10 mg total) by mouth 3 (three) times daily before meals. 90 capsule 5   EPINEPHrine 0.3 mg/0.3 mL IJ SOAJ injection Inject 0.3 mg into the muscle as directed. 2 each 1   fluticasone (FLONASE) 50 MCG/ACT nasal spray Place 2 sprays into both nostrils daily as needed for allergies or rhinitis. 48 g 1   omeprazole (PRILOSEC) 40 MG capsule TAKE 1 CAPSULE TWICE DAILY FOR HEARTBURN (Patient taking differently: Take 40 mg by mouth 2 (two) times daily before a meal.) 180 capsule 0   ondansetron (ZOFRAN) 4 MG tablet Take 1 tablet (4 mg total) by mouth every 8 (eight) hours as needed for nausea or vomiting. 60 tablet 3   sucralfate (CARAFATE) 1 g tablet Take 1 tablet (1 g total) by mouth 3 (three) times daily before meals. 90 tablet 2   Tenapanor HCl 50 MG TABS Take 50 mg by mouth 2 (two) times daily. 60 tablet 5   topiramate (TOPAMAX) 50 MG tablet Take 1 tablet  (50 mg total) by mouth daily. For headache prevention 90 tablet 2   No current facility-administered medications on file prior to visit.    Allergies  Allergen Reactions   Alpha-Gal Anaphylaxis, Hives and Other (See Comments)    Alpha-gal Syndrome (AGS)   Milk (Cow) Anaphylaxis    Alpha-gal Syndrome (AGS)   Milk-Related Compounds Anaphylaxis and Other (See Comments)    Alpha-gal Syndrome (AGS)   Other Anaphylaxis and Other (See Comments)    NO ANIMAL-DERIVED PRODUCTS!! Alpha-gal Syndrome (AGS)   Bactrim [Sulfamethoxazole-Trimethoprim] Other (See Comments)    Mouth ulcers and sores    Past Medical History:  Diagnosis Date   Acid reflux    Acute cystitis with hematuria 12/08/2020   Adenocarcinoma of the endometrium/uterus (HCC) 12/04/2000   Anal fissure    Anxiety    Atrophic vaginitis    COVID-19 virus infection 09/28/2020   Dehydration    Difficult intubation    limited neck mobility   Dysuria 08/13/2020   Esophagitis    Hernia    Present to proximal to umbillical    History of hiatal hernia    Hyperlipidemia    Hypertension    Hypokalemia 09/15/2022   Internal hemorrhoids    Intractable abdominal pain 09/15/2022   Lactic acidosis    Menorrhagia  Osteopenia    Partial small bowel obstruction (HCC) 01/26/2021   PONV (postoperative nausea and vomiting)    Recurrent UTI    Vertigo    Vitamin D deficiency     Past Surgical History:  Procedure Laterality Date   39 HOUR PH STUDY N/A 11/29/2015   Procedure: 24 HOUR PH STUDY;  Surgeon: Ruffin Frederick, MD;  Location: [facility];  Service: Gastroenterology;  Laterality: N/A;   ABDOMINAL HYSTERECTOMY  12/04/2000   also bilateral salpingo-oophorectomy   CARDIAC CATHETERIZATION  2006   CARPAL TUNNEL RELEASE  11/20/2011   Procedure: CARPAL TUNNEL RELEASE;  Surgeon: Mariam Dollar, MD;  Location: [facility];  Service: Neurosurgery;  Laterality: Left;  LEFT carpal tunnel release   CERVICAL SPINE SURGERY  2013    [facility]   CHOLECYSTECTOMY  1997   COLONOSCOPY WITH PROPOFOL N/A 07/27/2020   Procedure: COLONOSCOPY WITH PROPOFOL;  Surgeon: Midge Minium, MD;  Location: [facility] ENDOSCOPY;  Service: Endoscopy;  Laterality: N/A;   DILATION AND CURETTAGE OF UTERUS     11/15/2000   ENDOSCOPIC RETROGRADE CHOLANGIOPANCREATOGRAPHY (ERCP) WITH PROPOFOL N/A 09/14/2022   Procedure: ENDOSCOPIC RETROGRADE CHOLANGIOPANCREATOGRAPHY (ERCP) WITH PROPOFOL;  Surgeon: Meridee Score Netty Starring., MD;  Location: [facility];  Service: Gastroenterology;  Laterality: N/A;   ESOPHAGEAL MANOMETRY N/A 09/20/2015   Procedure: ESOPHAGEAL MANOMETRY (EM);  Surgeon: Ruffin Frederick, MD;  Location: [facility];  Service: Gastroenterology;  Laterality: N/A;   ESOPHAGOGASTRODUODENOSCOPY (EGD) WITH PROPOFOL N/A 05/17/2021   Procedure: ESOPHAGOGASTRODUODENOSCOPY (EGD) WITH PROPOFOL;  Surgeon: Pasty Spillers, MD;  Location: [facility] ENDOSCOPY;  Service: Endoscopy;  Laterality: N/A;   ESOPHAGOGASTRODUODENOSCOPY (EGD) WITH PROPOFOL N/A 07/17/2022   Procedure: ESOPHAGOGASTRODUODENOSCOPY (EGD) WITH PROPOFOL;  Surgeon: Wyline Mood, MD;  Location: [facility] ENDOSCOPY;  Service: Gastroenterology;  Laterality: N/A;   ESOPHAGOGASTRODUODENOSCOPY (EGD) WITH PROPOFOL N/A 09/14/2022   Procedure: ESOPHAGOGASTRODUODENOSCOPY (EGD) WITH PROPOFOL;  Surgeon: Meridee Score Netty Starring., MD;  Location: [facility];  Service: Gastroenterology;  Laterality: N/A;   EUS N/A 09/14/2022   Procedure: UPPER ENDOSCOPIC ULTRASOUND (EUS) RADIAL;  Surgeon: Lemar Lofty., MD;  Location: [facility];  Service: Gastroenterology;  Laterality: N/A;   HYSTEROSCOPY  11/15/2000   D & C, resection of endometrial polyps   LUMBAR SPINAL CORD SIMULATOR LEAD REMOVAL N/A 08/01/2021   Procedure: REMOVAL OF SPINAL CORD STIMULATOR;  Surgeon: Venetia Night, MD;  Location: [facility] ORS;  Service: Neurosurgery;  Laterality: N/A;   OOPHORECTOMY  12/04/2000   bilateral salpingo-oophorectomy done with  [facility]   REMOVAL OF STONES  09/14/2022   Procedure: REMOVAL OF STONES;  Surgeon: Meridee Score Netty Starring., MD;  Location: [facility];  Service: Gastroenterology;;   Dennison Mascot  09/14/2022   Procedure: Dennison Mascot;  Surgeon: Mansouraty, Netty Starring., MD;  Location: [facility];  Service: Gastroenterology;;   SPINAL CORD STIMULATOR INSERTION Bilateral 04/12/2020   Procedure: INSERTION CERVICAL SPINAL STIMULATOR PULSE GENERATOR;  Surgeon: Lucy Chris, MD;  Location: [facility] OR[facility];  Service: Neurosurgery;  Laterality: Bilateral;   SPINAL CORD STIMULATOR TRIAL N/A 03/22/2020   Procedure: CERVICAL SPINAL CORD STIMULATOR TRIAL PERCUTANEOUS;  Surgeon: Lucy Chris, MD;  Location: [facility] ORS;  Service: Neurosurgery;  Laterality: N/A;   TUBAL LIGATION      Family History  Problem Relation Age of Onset   Hypertension Father    Prostate cancer Father    Hypertension Sister    Lung cancer Sister    Diabetes Brother    Heart disease Brother    Hypertension Brother    Diabetes Brother  Heart disease Brother    Breast cancer Neg Hx     Social History   Socioeconomic History   Marital status: Married    Spouse name: Not on file   Number of children: Not on file   Years of education: Not on file   Highest education level: Not on file  Occupational History   Not on file  Tobacco Use   Smoking status: Never    Passive exposure: Never   Smokeless tobacco: Never  Vaping Use   Vaping status: Never Used  Substance and Sexual Activity   Alcohol use: No   Drug use: Never   Sexual activity: Yes    Birth control/protection: Post-menopausal, Surgical  Other Topics Concern   Not on file  Social History Narrative   Married   Lives in [place]   Has 2 children, 4 grandchildren   Enjoys walking, shopping.   Social Determinants of Health   Financial Resource Strain: Low Risk  (09/12/2022)   Overall Financial Resource Strain (CARDIA)    Difficulty of Paying Living Expenses: Not hard at all   Food Insecurity: No Food Insecurity (09/18/2022)   Hunger Vital Sign    Worried About Running Out of Food in the Last Year: Never true    Ran Out of Food in the Last Year: Never true  Transportation Needs: No Transportation Needs (09/18/2022)   PRAPARE - Administrator, Civil Service (Medical): No    Lack of Transportation (Non-Medical): No  Physical Activity: Inactive (09/12/2022)   Exercise Vital Sign    Days of Exercise per Week: 0 days    Minutes of Exercise per Session: 0 min  Stress: No Stress Concern Present (09/12/2022)   [employer] of Occupational Health - Occupational Stress Questionnaire    Feeling of Stress : Not at all  Social Connections: Moderately Integrated (09/12/2022)   Social Connection and Isolation Panel [NHANES]    Frequency of Communication with Friends and Family: More than three times a week    Frequency of Social Gatherings with Friends and Family: More than three times a week    Attends Religious Services: More than 4 times per year    Active Member of [employer] or Organizations: No    Attends Banker Meetings: Never    Marital Status: Married  Catering manager Violence: Not At Risk (09/15/2022)   Humiliation, Afraid, Rape, and Kick questionnaire    Fear of Current or Ex-Partner: No    Emotionally Abused: No    Physically Abused: No    Sexually Abused: No   Review of Systems Slight fever Some lower abdominal pain Some nausea--slight vomiting Has been able to eat---has to "force myself"    Objective:   Physical Exam Constitutional:      Appearance: Normal appearance.  Abdominal:     Comments: Moderate suprapubic tenderness Slight right CVA tenderness  Neurological:     Mental Status: She is alert.            Assessment & Plan:

## 2023-01-10 NOTE — Addendum Note (Signed)
Addended by: Eual Fines on: 01/10/2023 09:52 AM   Modules accepted: Orders

## 2023-01-10 NOTE — Assessment & Plan Note (Signed)
Urinalysis shows 3+ leuks and blood 1+ nitrite Will send culture Plan macrobid 100 bid x 7 days (but can stop after 3 if the symptoms resolved by tomorrow)

## 2023-01-11 ENCOUNTER — Ambulatory Visit: Payer: Medicare HMO

## 2023-01-12 LAB — URINE CULTURE
MICRO NUMBER:: 15329993
SPECIMEN QUALITY:: ADEQUATE

## 2023-01-17 ENCOUNTER — Other Ambulatory Visit: Payer: Self-pay | Admitting: Primary Care

## 2023-01-25 ENCOUNTER — Ambulatory Visit
Admission: RE | Admit: 2023-01-25 | Discharge: 2023-01-25 | Disposition: A | Discharge status: Home / Self Care | Payer: Medicare HMO | Source: Ambulatory Visit | Attending: Physician Assistant | Admitting: Physician Assistant

## 2023-01-25 ENCOUNTER — Other Ambulatory Visit: Payer: Self-pay | Admitting: Physician Assistant

## 2023-01-25 ENCOUNTER — Other Ambulatory Visit: Payer: Self-pay | Admitting: Primary Care

## 2023-01-25 DIAGNOSIS — R634 Abnormal weight loss: Secondary | ICD-10-CM | POA: Insufficient documentation

## 2023-01-25 DIAGNOSIS — R9389 Abnormal findings on diagnostic imaging of other specified body structures: Secondary | ICD-10-CM | POA: Insufficient documentation

## 2023-01-25 DIAGNOSIS — K862 Cyst of pancreas: Secondary | ICD-10-CM

## 2023-01-25 DIAGNOSIS — Z9049 Acquired absence of other specified parts of digestive tract: Secondary | ICD-10-CM | POA: Diagnosis not present

## 2023-01-25 DIAGNOSIS — R1084 Generalized abdominal pain: Secondary | ICD-10-CM | POA: Diagnosis not present

## 2023-01-25 DIAGNOSIS — N281 Cyst of kidney, acquired: Secondary | ICD-10-CM | POA: Diagnosis not present

## 2023-01-25 DIAGNOSIS — J309 Allergic rhinitis, unspecified: Secondary | ICD-10-CM

## 2023-01-25 DIAGNOSIS — R109 Unspecified abdominal pain: Secondary | ICD-10-CM | POA: Diagnosis not present

## 2023-01-25 MED ORDER — GADOBUTROL 1 MMOL/ML IV SOLN
5.0000 mL | Freq: Once | INTRAVENOUS | Status: AC | PRN
  Administered 2023-01-25: 5 mL via INTRAVENOUS

## 2023-01-30 NOTE — Progress Notes (Signed)
Duplicate results.  See other MRI report.  No acute abnormality.  Nothing worrisome.

## 2023-01-30 NOTE — Progress Notes (Signed)
Call and notify patient abdominal MRI is normal.  Previous cholecystectomy.  No acute abnormality to explain abdominal pain.  Nothing worrisome.  Symptoms are most consistent with irritable bowel syndrome.  Continue with current treatment. Celso Amy, PA-C

## 2023-01-31 ENCOUNTER — Telehealth: Payer: Self-pay

## 2023-01-31 NOTE — Telephone Encounter (Signed)
Patient notied.   Call and notify patient abdominal MRI is normal.  Previous cholecystectomy.  No acute abnormality to explain abdominal pain.  Nothing worrisome.  Symptoms are most consistent with irritable bowel syndrome.  Continue with current treatment.  Celso Amy, PA-C

## 2023-02-02 ENCOUNTER — Ambulatory Visit (INDEPENDENT_AMBULATORY_CARE_PROVIDER_SITE_OTHER): Payer: Medicare HMO | Admitting: Primary Care

## 2023-02-02 ENCOUNTER — Encounter: Payer: Self-pay | Admitting: Primary Care

## 2023-02-02 VITALS — BP 136/82 | HR 82 | Temp 97.2°F | Ht 65.0 in | Wt 119.0 lb

## 2023-02-02 DIAGNOSIS — K130 Diseases of lips: Secondary | ICD-10-CM

## 2023-02-02 NOTE — Assessment & Plan Note (Addendum)
Exam today without obvious lesion. She does have mild skin breakdown to her lower lip for which is suspected to be secondary to her picking at her lip.  Checking labs today to evaluate for HSV. Start mupirocin 2% ointment twice daily as needed.  Consider Kenalog and Orabase if no improvement.

## 2023-02-02 NOTE — Progress Notes (Signed)
Subjective:    Patient ID: Valerie [name], female    DOB: [birth year], 72 y.o.   MRN: [id number]  Mouth Lesions  Associated symptoms include mouth sores. Pertinent negatives include no fever and no sore throat.    Valerie [name] is a very pleasant 72 y.o. female with history of hypertension, GERD, chronic constipation, chronic abdominal pain, chronic nausea, uterine cancer, prediabetes, frequent headaches who presents today to discuss oral lesions.  Her lesions occur to the upper and lower lip for which she describes as "tender". The sores will peel so she will pull the skin off. The sores will improve, then return within a few days. She does notice soreness to the tip of her tongue, hasn't noticed a lesion there. She has noticed the sore to the corners of her mouth that crack.   She denies bleeding, itching, sores on the inside of her oral cavity, increased stress, prior diagnosis of herpes, biting her lip, changes in her diet, new supplements or medications. She wears dentures.   She's tried aquaphor lip repair without improvement.    Review of Systems  Constitutional:  Negative for fever.  HENT:  Positive for mouth sores. Negative for facial swelling and sore throat.          Past Medical History:  Diagnosis Date   Acid reflux    Acute cystitis with hematuria 12/08/2020   Adenocarcinoma of the endometrium/uterus (HCC) 12/04/2000   Anal fissure    Anxiety    Atrophic vaginitis    COVID-19 virus infection 09/28/2020   Dehydration    Difficult intubation    limited neck mobility   Dysuria 08/13/2020   Esophagitis    Hernia    Present to proximal to umbillical    History of hiatal hernia    Hyperlipidemia    Hypertension    Hypokalemia 09/15/2022   Internal hemorrhoids    Intractable abdominal pain 09/15/2022   Lactic acidosis    Menorrhagia    Osteopenia    Partial small bowel obstruction (HCC) 01/26/2021   PONV (postoperative nausea and vomiting)    Recurrent UTI     Vertigo    Vitamin D deficiency     Social History   Socioeconomic History   Marital status: Married    Spouse name: Not on file   Number of children: Not on file   Years of education: Not on file   Highest education level: Not on file  Occupational History   Not on file  Tobacco Use   Smoking status: Never    Passive exposure: Never   Smokeless tobacco: Never  Vaping Use   Vaping status: Never Used  Substance and Sexual Activity   Alcohol use: No   Drug use: Never   Sexual activity: Yes    Birth control/protection: Post-menopausal, Surgical  Other Topics Concern   Not on file  Social History Narrative   Married   Lives in [place]   Has 2 children, 4 grandchildren   Enjoys walking, shopping.   Social Determinants of Health   Financial Resource Strain: Low Risk  (09/12/2022)   Overall Financial Resource Strain (CARDIA)    Difficulty of Paying Living Expenses: Not hard at all  Food Insecurity: No Food Insecurity (09/18/2022)   Hunger Vital Sign    Worried About Running Out of Food in the Last Year: Never true    Ran Out of Food in the Last Year: Never true  Transportation Needs: No Transportation Needs (09/18/2022)  PRAPARE - Administrator, Civil Service (Medical): No    Lack of Transportation (Non-Medical): No  Physical Activity: Inactive (09/12/2022)   Exercise Vital Sign    Days of Exercise per Week: 0 days    Minutes of Exercise per Session: 0 min  Stress: No Stress Concern Present (09/12/2022)   [employer] of Occupational Health - Occupational Stress Questionnaire    Feeling of Stress : Not at all  Social Connections: Moderately Integrated (09/12/2022)   Social Connection and Isolation Panel [NHANES]    Frequency of Communication with Friends and Family: More than three times a week    Frequency of Social Gatherings with Friends and Family: More than three times a week    Attends Religious Services: More than 4 times per year    Active  Member of [employer] or Organizations: No    Attends Banker Meetings: Never    Marital Status: Married  Catering manager Violence: Not At Risk (09/15/2022)   Humiliation, Afraid, Rape, and Kick questionnaire    Fear of Current or Ex-Partner: No    Emotionally Abused: No    Physically Abused: No    Sexually Abused: No    Past Surgical History:  Procedure Laterality Date   80 HOUR PH STUDY N/A 11/29/2015   Procedure: 24 HOUR PH STUDY;  Surgeon: Ruffin Frederick, MD;  Location: [facility];  Service: Gastroenterology;  Laterality: N/A;   ABDOMINAL HYSTERECTOMY  12/04/2000   also bilateral salpingo-oophorectomy   CARDIAC CATHETERIZATION  2006   CARPAL TUNNEL RELEASE  11/20/2011   Procedure: CARPAL TUNNEL RELEASE;  Surgeon: Mariam Dollar, MD;  Location: [facility];  Service: Neurosurgery;  Laterality: Left;  LEFT carpal tunnel release   CERVICAL SPINE SURGERY  2013   [facility]   CHOLECYSTECTOMY  1997   COLONOSCOPY WITH PROPOFOL N/A 07/27/2020   Procedure: COLONOSCOPY WITH PROPOFOL;  Surgeon: Midge Minium, MD;  Location: [facility] ENDOSCOPY;  Service: Endoscopy;  Laterality: N/A;   DILATION AND CURETTAGE OF UTERUS     11/15/2000   ENDOSCOPIC RETROGRADE CHOLANGIOPANCREATOGRAPHY (ERCP) WITH PROPOFOL N/A 09/14/2022   Procedure: ENDOSCOPIC RETROGRADE CHOLANGIOPANCREATOGRAPHY (ERCP) WITH PROPOFOL;  Surgeon: Meridee Score Netty Starring., MD;  Location: [facility];  Service: Gastroenterology;  Laterality: N/A;   ESOPHAGEAL MANOMETRY N/A 09/20/2015   Procedure: ESOPHAGEAL MANOMETRY (EM);  Surgeon: Ruffin Frederick, MD;  Location: [facility];  Service: Gastroenterology;  Laterality: N/A;   ESOPHAGOGASTRODUODENOSCOPY (EGD) WITH PROPOFOL N/A 05/17/2021   Procedure: ESOPHAGOGASTRODUODENOSCOPY (EGD) WITH PROPOFOL;  Surgeon: Pasty Spillers, MD;  Location: [facility] END[facility]COPY;  Service: Endoscopy;  Laterality: N/A;   ESOPHAGOGASTRODUODENOSCOPY (EGD) WITH PROPOFOL N/A 07/17/2022   Procedure:  ESOPHAGOGASTRODUODENOSCOPY (EGD) WITH PROPOFOL;  Surgeon: Wyline Mood, MD;  Location: [facility] ENDOSCOPY;  Service: Gastroenterology;  Laterality: N/A;   ESOPHAGOGASTRODUODENOSCOPY (EGD) WITH PROPOFOL N/A 09/14/2022   Procedure: ESOPHAGOGASTRODUODENOSCOPY (EGD) WITH PROPOFOL;  Surgeon: Meridee Score Netty Starring., MD;  Location: [facility];  Service: Gastroenterology;  Laterality: N/A;   EUS N/A 09/14/2022   Procedure: UPPER ENDOSCOPIC ULTRASOUND (EUS) RADIAL;  Surgeon: Lemar Lofty., MD;  Location: [facility];  Service: Gastroenterology;  Laterality: N/A;   HYSTEROSCOPY  11/15/2000   D & C, resection of endometrial polyps   LUMBAR SPINAL CORD SIMULATOR LEAD REMOVAL N/A 08/01/2021   Procedure: REMOVAL OF SPINAL CORD STIMULATOR;  Surgeon: Venetia Night, MD;  Location: [facility];  Service: Neurosurgery;  Laterality: N/A;   OOPHORECTOMY  12/04/2000   bilateral salpingo-oophorectomy done with TAH   REMOVAL  OF STONES  09/14/2022   Procedure: REMOVAL OF STONES;  Surgeon: Meridee Score Netty Starring., MD;  Location: [facility];  Service: Gastroenterology;;   Dennison Mascot  09/14/2022   Procedure: Dennison Mascot;  Surgeon: Mansouraty, Netty Starring., MD;  Location: [facility];  Service: Gastroenterology;;   SPINAL CORD STIMULATOR INSERTION Bilateral 04/12/2020   Procedure: INSERTION CERVICAL SPINAL STIMULATOR PULSE GENERATOR;  Surgeon: Lucy Chris, MD;  Location: [facility];  Service: Neurosurgery;  Laterality: Bilateral;   SPINAL CORD STIMULATOR TRIAL N/A 03/22/2020   Procedure: CERVICAL SPINAL CORD STIMULATOR TRIAL PERCUTANEOUS;  Surgeon: Lucy Chris, MD;  Location: [facility];  Service: Neurosurgery;  Laterality: N/A;   TUBAL LIGATION      Family History  Problem Relation Age of Onset   Hypertension Father    Prostate cancer Father    Hypertension Sister    Lung cancer Sister    Diabetes Brother    Heart disease Brother    Hypertension Brother    Diabetes Brother    Heart disease Brother     Breast cancer Neg Hx     Allergies  Allergen Reactions   Alpha-Gal Anaphylaxis, Hives and Other (See Comments)    Alpha-gal Syndrome (AGS)   Milk (Cow) Anaphylaxis    Alpha-gal Syndrome (AGS)   Milk-Related Compounds Anaphylaxis and Other (See Comments)    Alpha-gal Syndrome (AGS)   Other Anaphylaxis and Other (See Comments)    NO ANIMAL-DERIVED PRODUCTS!! Alpha-gal Syndrome (AGS)   Bactrim [Sulfamethoxazole-Trimethoprim] Other (See Comments)    Mouth ulcers and sores    Current Outpatient Medications on File Prior to Visit  Medication Sig Dispense Refill   acetaminophen (TYLENOL) 325 MG tablet Take 325 mg by mouth 2 (two) times daily as needed (pain or headaches).     atorvastatin (LIPITOR) 10 MG tablet Take 1 tablet (10 mg total) by mouth daily. for cholesterol. 90 tablet 0   azelastine (ASTELIN) 0.1 % nasal spray Place 1 spray into both nostrils 2 (two) times daily. Use in each nostril as directed 30 mL 5   CALCIUM-VITAMIN D PO Take 2 tablets by mouth daily.     cetirizine (ZYRTEC ALLERGY) 10 MG tablet Take 1 tablet (10 mg total) by mouth daily. 30 tablet 5   cyclobenzaprine (FLEXERIL) 5 MG tablet Take 1 tablet (5 mg total) by mouth at bedtime as needed for muscle spasms. 15 tablet 0   dicyclomine (BENTYL) 10 MG capsule Take 1 capsule (10 mg total) by mouth 3 (three) times daily before meals. 90 capsule 5   EPINEPHrine 0.3 mg/0.3 mL IJ SOAJ injection Inject 0.3 mg into the muscle as directed. 2 each 1   fluticasone (FLONASE) 50 MCG/ACT nasal spray USE 2 SPRAYS IN EACH NOSTRIL EVERY DAY AS NEEDED FOR ALLERGIES OR RHINITIS 48 g 2   nitrofurantoin, macrocrystal-monohydrate, (MACROBID) 100 MG capsule Take 1 capsule (100 mg total) by mouth 2 (two) times daily. 14 capsule 0   omeprazole (PRILOSEC) 40 MG capsule TAKE 1 CAPSULE TWICE DAILY FOR HEARTBURN (Patient taking differently: Take 40 mg by mouth 2 (two) times daily before a meal.) 180 capsule 0   ondansetron (ZOFRAN) 4 MG tablet  Take 1 tablet (4 mg total) by mouth every 8 (eight) hours as needed for nausea or vomiting. 60 tablet 3   sucralfate (CARAFATE) 1 g tablet Take 1 tablet (1 g total) by mouth 3 (three) times daily before meals. 90 tablet 2   topiramate (TOPAMAX) 50 MG tablet Take 1 tablet (50 mg total) by mouth daily.  For headache prevention 90 tablet 2   Tenapanor HCl 50 MG TABS Take 50 mg by mouth 2 (two) times daily. (Patient not taking: Reported on 02/02/2023) 60 tablet 5   No current facility-administered medications on file prior to visit.    BP 136/82   Pulse 82   Temp (!) 97.2 F (36.2 C) (Temporal)   Ht 5\' 5"  (1.651 m)   Wt 119 lb (54 kg)   SpO2 98%   BMI 19.80 kg/m  Objective:   Physical Exam Constitutional:      General: She is not in acute distress. HENT:     Mouth/Throat:     Lips: No lesions.     Mouth: Mucous membranes are moist. No oral lesions.     Pharynx: No pharyngeal swelling or posterior oropharyngeal erythema.      Comments: Small crack in the lower lip without bleeding. No lesions to the corners of her mouth.          Assessment & Plan:  Lip lesion Assessment & Plan: Exam today without obvious lesion. She does have mild skin breakdown to her lower lip for which is suspected to be secondary to her picking at her lip.  Checking labs today to evaluate for HSV. Start mupirocin 2% ointment twice daily as needed.  Consider Kenalog and Orabase if no improvement.  Orders: -     Herpes Simplex Virus 1 and 2 (IgG), with Reflex to HSV-2 Inhibition        Doreene Nest, NP

## 2023-02-02 NOTE — Patient Instructions (Signed)
You can apply the mupirocin ointment twice daily as needed.  Avoid picking at your lip.  Stop by the lab prior to leaving today. I will notify you of your results once received.   It was a pleasure to see you today!

## 2023-02-08 ENCOUNTER — Telehealth: Payer: Self-pay | Admitting: Primary Care

## 2023-02-08 ENCOUNTER — Other Ambulatory Visit: Payer: Self-pay | Admitting: Primary Care

## 2023-02-08 DIAGNOSIS — K130 Diseases of lips: Secondary | ICD-10-CM

## 2023-02-08 LAB — HERPES SIMPLEX VIRUS 1 AND 2 (IGG),REFLEX HSV-2 INHIBITION
HSV 1 IGG,TYPE SPECIFIC AB: 57.8 {index} — ABNORMAL HIGH
HSV 2 IGG,TYPE SPECIFIC AB: 2.24 {index} — ABNORMAL HIGH

## 2023-02-08 LAB — HSV 2 INHIBITION: HSV 2 IGG INHIBITION,IA: POSITIVE — AB

## 2023-02-08 MED ORDER — VALACYCLOVIR HCL 1 G PO TABS: ORAL_TABLET | ORAL | 0 refills | Status: AC

## 2023-02-08 NOTE — Telephone Encounter (Signed)
Patient called in to follow up on lab results. She also stated that she never received the medication that was suppose to be sent in. Thank you!

## 2023-02-08 NOTE — Telephone Encounter (Signed)
Called and spoke with patient, further documentation in result note.

## 2023-02-12 ENCOUNTER — Other Ambulatory Visit: Payer: Self-pay | Admitting: Primary Care

## 2023-02-12 DIAGNOSIS — K130 Diseases of lips: Secondary | ICD-10-CM

## 2023-02-12 MED ORDER — MUPIROCIN 2 % EX OINT
1.0000 | TOPICAL_OINTMENT | Freq: Two times a day (BID) | CUTANEOUS | 0 refills | Status: AC
Start: 2023-02-12 — End: ?

## 2023-02-19 ENCOUNTER — Ambulatory Visit
Admission: RE | Admit: 2023-02-19 | Discharge: 2023-02-19 | Disposition: A | Discharge status: Home / Self Care | Payer: Medicare HMO | Source: Ambulatory Visit | Attending: Primary Care | Admitting: Primary Care

## 2023-02-19 DIAGNOSIS — Z1231 Encounter for screening mammogram for malignant neoplasm of breast: Secondary | ICD-10-CM | POA: Insufficient documentation

## 2023-02-19 DIAGNOSIS — E2839 Other primary ovarian failure: Secondary | ICD-10-CM | POA: Insufficient documentation

## 2023-02-19 DIAGNOSIS — M85832 Other specified disorders of bone density and structure, left forearm: Secondary | ICD-10-CM | POA: Diagnosis not present

## 2023-02-28 ENCOUNTER — Ambulatory Visit: Payer: Medicare HMO | Admitting: Primary Care

## 2023-03-01 ENCOUNTER — Encounter: Payer: Self-pay | Admitting: Primary Care

## 2023-03-01 ENCOUNTER — Ambulatory Visit (INDEPENDENT_AMBULATORY_CARE_PROVIDER_SITE_OTHER): Payer: Medicare HMO | Admitting: Primary Care

## 2023-03-01 VITALS — BP 122/76 | HR 74 | Temp 98.0°F | Ht 65.0 in | Wt 119.0 lb

## 2023-03-01 DIAGNOSIS — K130 Diseases of lips: Secondary | ICD-10-CM

## 2023-03-01 DIAGNOSIS — Z23 Encounter for immunization: Secondary | ICD-10-CM

## 2023-03-01 MED ORDER — TRIAMCINOLONE ACETONIDE 0.1 % MT PSTE: 1.0000 | PASTE | Freq: Two times a day (BID) | OROMUCOSAL | 0 refills | Status: AC

## 2023-03-01 NOTE — Patient Instructions (Signed)
You may try the triamcinolone 0.1% paste.  Apply twice daily for about a week.  Please notify us if no improvement.  It was a pleasure to see you today!

## 2023-03-01 NOTE — Progress Notes (Signed)
Subjective:    Patient ID: Valerie [name], female    DOB: [birth year], 72 y.o.   MRN: [id number]  Mouth Lesions  Associated symptoms include mouth sores. Pertinent negatives include no fever and no cough.    Valerie [name] is a very pleasant 72 y.o. female with a history of GERD, gastric erythema, lip lesion, prediabetes, chronic nausea who presents today for reevaluation of lip lesion.  She was last evaluated on 02/02/2023 for recurrent lesions to the upper and lower lip.  Labs were collected that day which were positive for HSV type I and II.  She was treated with a course of Valtrex but could only tolerate 1 dose.  She was then treated with mupirocin ointment.  Today she continues to notice lip peeling inside the upper lip, soreness to the tip of the tongue, and a sore to the inside of the lower lip. She visited her dentist recently who didn't know the cause for her symptoms.   She does wear dentures, has noticed that they are more loose fitting than usual.    Review of Systems  Constitutional:  Negative for fever.  HENT:  Positive for mouth sores.   Respiratory:  Negative for cough.          Past Medical History:  Diagnosis Date   Acid reflux    Acute cystitis with hematuria 12/08/2020   Adenocarcinoma of the endometrium/uterus (HCC) 12/04/2000   Anal fissure    Anxiety    Atrophic vaginitis    COVID-19 virus infection 09/28/2020   Dehydration    Difficult intubation    limited neck mobility   Dysuria 08/13/2020   Esophagitis    Hernia    Present to proximal to umbillical    History of hiatal hernia    Hyperlipidemia    Hypertension    Hypokalemia 09/15/2022   Internal hemorrhoids    Intractable abdominal pain 09/15/2022   Lactic acidosis    Menorrhagia    Osteopenia    Partial small bowel obstruction (HCC) 01/26/2021   PONV (postoperative nausea and vomiting)    Recurrent UTI    Vertigo    Vitamin D deficiency     Social History   Socioeconomic History    Marital status: Married    Spouse name: Not on file   Number of children: Not on file   Years of education: Not on file   Highest education level: Not on file  Occupational History   Not on file  Tobacco Use   Smoking status: Never    Passive exposure: Never   Smokeless tobacco: Never  Vaping Use   Vaping status: Never Used  Substance and Sexual Activity   Alcohol use: No   Drug use: Never   Sexual activity: Yes    Birth control/protection: Post-menopausal, Surgical  Other Topics Concern   Not on file  Social History Narrative   Married   Lives in [place]   Has 2 children, 4 grandchildren   Enjoys walking, shopping.   Social Determinants of Health   Financial Resource Strain: Low Risk  (09/12/2022)   Overall Financial Resource Strain (CARDIA)    Difficulty of Paying Living Expenses: Not hard at all  Food Insecurity: No Food Insecurity (09/18/2022)   Hunger Vital Sign    Worried About Running Out of Food in the Last Year: Never true    Ran Out of Food in the Last Year: Never true  Transportation Needs: No Transportation Needs (09/18/2022)   PRAPARE -  Administrator, Civil Service (Medical): No    Lack of Transportation (Non-Medical): No  Physical Activity: Inactive (09/12/2022)   Exercise Vital Sign    Days of Exercise per Week: 0 days    Minutes of Exercise per Session: 0 min  Stress: No Stress Concern Present (09/12/2022)   [employer] of Occupational Health - Occupational Stress Questionnaire    Feeling of Stress : Not at all  Social Connections: Moderately Integrated (09/12/2022)   Social Connection and Isolation Panel [NHANES]    Frequency of Communication with Friends and Family: More than three times a week    Frequency of Social Gatherings with Friends and Family: More than three times a week    Attends Religious Services: More than 4 times per year    Active Member of [employer] or Organizations: No    Attends Banker Meetings: Never     Marital Status: Married  Catering manager Violence: Not At Risk (09/15/2022)   Humiliation, Afraid, Rape, and Kick questionnaire    Fear of Current or Ex-Partner: No    Emotionally Abused: No    Physically Abused: No    Sexually Abused: No    Past Surgical History:  Procedure Laterality Date   46 HOUR PH STUDY N/A 11/29/2015   Procedure: 24 HOUR PH STUDY;  Surgeon: Ruffin Frederick, MD;  Location: [facility];  Service: Gastroenterology;  Laterality: N/A;   ABDOMINAL HYSTERECTOMY  12/04/2000   also bilateral salpingo-oophorectomy   CARDIAC CATHETERIZATION  2006   CARPAL TUNNEL RELEASE  11/20/2011   Procedure: CARPAL TUNNEL RELEASE;  Surgeon: Mariam Dollar, MD;  Location: [facility];  Service: Neurosurgery;  Laterality: Left;  LEFT carpal tunnel release   CERVICAL SPINE SURGERY  2013   [facility]   CHOLECYSTECTOMY  1997   COLONOSCOPY WITH PROPOFOL N/A 07/27/2020   Procedure: COLONOSCOPY WITH PROPOFOL;  Surgeon: Midge Minium, MD;  Location: [facility] ENDOSCOPY;  Service: Endoscopy;  Laterality: N/A;   DILATION AND CURETTAGE OF UTERUS     11/15/2000   ENDOSCOPIC RETROGRADE CHOLANGIOPANCREATOGRAPHY (ERCP) WITH PROPOFOL N/A 09/14/2022   Procedure: ENDOSCOPIC RETROGRADE CHOLANGIOPANCREATOGRAPHY (ERCP) WITH PROPOFOL;  Surgeon: Meridee Score Netty Starring., MD;  Location: [facility];  Service: Gastroenterology;  Laterality: N/A;   ESOPHAGEAL MANOMETRY N/A 09/20/2015   Procedure: ESOPHAGEAL MANOMETRY (EM);  Surgeon: Ruffin Frederick, MD;  Location: [facility];  Service: Gastroenterology;  Laterality: N/A;   ESOPHAGOGASTRODUODENOSCOPY (EGD) WITH PROPOFOL N/A 05/17/2021   Procedure: ESOPHAGOGASTRODUODENOSCOPY (EGD) WITH PROPOFOL;  Surgeon: Pasty Spillers, MD;  Location: [facility] ENDOSCOPY;  Service: Endoscopy;  Laterality: N/A;   ESOPHAGOGASTRODUODENOSCOPY (EGD) WITH PROPOFOL N/A 07/17/2022   Procedure: ESOPHAGOGASTRODUODENOSCOPY (EGD) WITH PROPOFOL;  Surgeon: Wyline Mood, MD;  Location: [facility]  ENDOSCOPY;  Service: Gastroenterology;  Laterality: N/A;   ESOPHAGOGASTRODUODENOSCOPY (EGD) WITH PROPOFOL N/A 09/14/2022   Procedure: ESOPHAGOGASTRODUODENOSCOPY (EGD) WITH PROPOFOL;  Surgeon: Meridee Score Netty Starring., MD;  Location: [facility]OSCOPY;  Service: Gastroenterology;  Laterality: N/A;   EUS N/A 09/14/2022   Procedure: UPPER ENDOSCOPIC ULTRASOUND (EUS) RADIAL;  Surgeon: Lemar Lofty., MD;  Location: [facility] ENDOSCOPY;  Service: Gastroenterology;  Laterality: N/A;   HYSTEROSCOPY  11/15/2000   D & C, resection of endometrial polyps   LUMBAR SPINAL CORD SIMULATOR LEAD REMOVAL N/A 08/01/2021   Procedure: REMOVAL OF SPINAL CORD STIMULATOR;  Surgeon: Venetia Night, MD;  Location: [facility] ORS;  Service: Neurosurgery;  Laterality: N/A;   OOPHORECTOMY  12/04/2000   bilateral salpingo-oophorectomy done with TAH   REMOVAL OF STONES  09/14/2022   Procedure: REMOVAL OF STONES;  Surgeon: Meridee Score Netty Starring., MD;  Location: [facility] END[facility];  Service: Gastroenterology;;   Dennison Mascot  09/14/2022   Procedure: Dennison Mascot;  Surgeon: Mansouraty, Netty Starring., MD;  Location: [facility];  Service: Gastroenterology;;   SPINAL CORD STIMULATOR INSERTION Bilateral 04/12/2020   Procedure: INSERTION CERVICAL SPINAL STIMULATOR PULSE GENERATOR;  Surgeon: Lucy Chris, MD;  Location: [facility] ORS;  Service: Neurosurgery;  Laterality: Bilateral;   SPINAL CORD STIMULATOR TRIAL N/A 03/22/2020   Procedure: CERVICAL SPINAL CORD STIMULATOR TRIAL PERCUTANEOUS;  Surgeon: Lucy Chris, MD;  Location: [facility] ORS;  Service: Neurosurgery;  Laterality: N/A;   TUBAL LIGATION      Family History  Problem Relation Age of Onset   Hypertension Father    Prostate cancer Father    Hypertension Sister    Lung cancer Sister    Diabetes Brother    Heart disease Brother    Hypertension Brother    Diabetes Brother    Heart disease Brother    Breast cancer Neg Hx     Allergies  Allergen Reactions   Alpha-Gal Anaphylaxis,  Hives and Other (See Comments)    Alpha-gal Syndrome (AGS)   Milk (Cow) Anaphylaxis    Alpha-gal Syndrome (AGS)   Milk-Related Compounds Anaphylaxis and Other (See Comments)    Alpha-gal Syndrome (AGS)   Other Anaphylaxis and Other (See Comments)    NO ANIMAL-DERIVED PRODUCTS!! Alpha-gal Syndrome (AGS)   Bactrim [Sulfamethoxazole-Trimethoprim] Other (See Comments)    Mouth ulcers and sores    Current Outpatient Medications on File Prior to Visit  Medication Sig Dispense Refill   acetaminophen (TYLENOL) 325 MG tablet Take 325 mg by mouth 2 (two) times daily as needed (pain or headaches).     atorvastatin (LIPITOR) 10 MG tablet Take 1 tablet (10 mg total) by mouth daily. for cholesterol. 90 tablet 0   azelastine (ASTELIN) 0.1 % nasal spray Place 1 spray into both nostrils 2 (two) times daily. Use in each nostril as directed 30 mL 5   CALCIUM-VITAMIN D PO Take 2 tablets by mouth daily.     cetirizine (ZYRTEC ALLERGY) 10 MG tablet Take 1 tablet (10 mg total) by mouth daily. 30 tablet 5   cyclobenzaprine (FLEXERIL) 5 MG tablet Take 1 tablet (5 mg total) by mouth at bedtime as needed for muscle spasms. 15 tablet 0   dicyclomine (BENTYL) 10 MG capsule Take 1 capsule (10 mg total) by mouth 3 (three) times daily before meals. 90 capsule 5   EPINEPHrine 0.3 mg/0.3 mL IJ SOAJ injection Inject 0.3 mg into the muscle as directed. 2 each 1   fluticasone (FLONASE) 50 MCG/ACT nasal spray USE 2 SPRAYS IN EACH NOSTRIL EVERY DAY AS NEEDED FOR ALLERGIES OR RHINITIS 48 g 2   mupirocin ointment (BACTROBAN) 2 % Apply 1 Application topically 2 (two) times daily. 22 g 0   nitrofurantoin, macrocrystal-monohydrate, (MACROBID) 100 MG capsule Take 1 capsule (100 mg total) by mouth 2 (two) times daily. 14 capsule 0   omeprazole (PRILOSEC) 40 MG capsule TAKE 1 CAPSULE TWICE DAILY FOR HEARTBURN (Patient taking differently: Take 40 mg by mouth 2 (two) times daily before a meal.) 180 capsule 0   ondansetron (ZOFRAN) 4 MG  tablet Take 1 tablet (4 mg total) by mouth every 8 (eight) hours as needed for nausea or vomiting. 60 tablet 3   topiramate (TOPAMAX) 50 MG tablet Take 1 tablet (50 mg total) by mouth daily. For headache prevention 90 tablet 2   sucralfate (  CARAFATE) 1 g tablet Take 1 tablet (1 g total) by mouth 3 (three) times daily before meals. 90 tablet 2   valACYclovir (VALTREX) 1000 MG tablet Take 2 tablets twice daily for 1 day. (Patient not taking: Reported on 03/01/2023) 4 tablet 0   No current facility-administered medications on file prior to visit.    BP 122/76   Pulse 74   Temp 98 F (36.7 C) (Temporal)   Ht 5\' 5"  (1.651 m)   Wt 119 lb (54 kg)   SpO2 97%   BMI 19.80 kg/m  Objective:   Physical Exam Constitutional:      General: She is not in acute distress. HENT:     Mouth/Throat:     Mouth: Mucous membranes are moist. No oral lesions.     Dentition: Has dentures. No gum lesions.     Tongue: No lesions.     Palate: No mass and lesions.     Comments: Mild irritation noted to mid upper and lower lip on the inside of her mouth, no ulcers or lesions. Cardiovascular:     Rate and Rhythm: Normal rate.  Pulmonary:     Effort: Pulmonary effort is normal.  Skin:    General: Skin is warm and dry.  Neurological:     Mental Status: She is alert.           Assessment & Plan:  Lip lesion Assessment & Plan: Unclear etiology at this point.  Trial of triamcinolone 0.1% paste twice daily. If no improvement then will refer to ENT.  Orders: -     Triamcinolone Acetonide; Use as directed 1 Application in the mouth or throat 2 (two) times daily.  Dispense: 5 g; Refill: 0        Doreene Nest, NP

## 2023-03-01 NOTE — Assessment & Plan Note (Signed)
Unclear etiology at this point.  Trial of triamcinolone 0.1% paste twice daily. If no improvement then will refer to ENT.

## 2023-03-09 ENCOUNTER — Ambulatory Visit: Payer: Medicare HMO | Admitting: Family Medicine

## 2023-03-09 ENCOUNTER — Encounter: Payer: Self-pay | Admitting: Family Medicine

## 2023-03-09 VITALS — BP 134/80 | HR 63 | Temp 98.0°F | Ht 65.0 in | Wt 119.8 lb

## 2023-03-09 DIAGNOSIS — Z23 Encounter for immunization: Secondary | ICD-10-CM

## 2023-03-09 DIAGNOSIS — S51819A Laceration without foreign body of unspecified forearm, initial encounter: Secondary | ICD-10-CM | POA: Diagnosis not present

## 2023-03-09 NOTE — Assessment & Plan Note (Signed)
Superficial scrape of right forearm.  No obvious signs of infection.  Discussed that the mild surrounding erythema was likely inflammatory.  Advised to seek medical attention if you develop spreading redness, swelling, purulent drainage, or fevers.  Given that tetanus vaccine is more than 70 years old we will update her Td today.

## 2023-03-09 NOTE — Progress Notes (Signed)
Marikay Alar, MD Phone: [phone]  Valerie [name] is a 72 y.o. female who presents today for same-day visit.  Right forearm wound: Patient notes she tripped yesterday and scraped her right forearm on rusty barb wire.  She does not feel as though it is infected.  Her last tetanus shot was in 2017.  Social History   Tobacco Use  Smoking Status Never   Passive exposure: Never  Smokeless Tobacco Never    Current Outpatient Medications on File Prior to Visit  Medication Sig Dispense Refill   acetaminophen (TYLENOL) 325 MG tablet Take 325 mg by mouth 2 (two) times daily as needed (pain or headaches).     atorvastatin (LIPITOR) 10 MG tablet Take 1 tablet (10 mg total) by mouth daily. for cholesterol. 90 tablet 0   azelastine (ASTELIN) 0.1 % nasal spray Place 1 spray into both nostrils 2 (two) times daily. Use in each nostril as directed 30 mL 5   CALCIUM-VITAMIN D PO Take 2 tablets by mouth daily.     cetirizine (ZYRTEC ALLERGY) 10 MG tablet Take 1 tablet (10 mg total) by mouth daily. 30 tablet 5   cyclobenzaprine (FLEXERIL) 5 MG tablet Take 1 tablet (5 mg total) by mouth at bedtime as needed for muscle spasms. 15 tablet 0   dicyclomine (BENTYL) 10 MG capsule Take 1 capsule (10 mg total) by mouth 3 (three) times daily before meals. 90 capsule 5   EPINEPHrine 0.3 mg/0.3 mL IJ SOAJ injection Inject 0.3 mg into the muscle as directed. 2 each 1   fluticasone (FLONASE) 50 MCG/ACT nasal spray USE 2 SPRAYS IN EACH NOSTRIL EVERY DAY AS NEEDED FOR ALLERGIES OR RHINITIS 48 g 2   mupirocin ointment (BACTROBAN) 2 % Apply 1 Application topically 2 (two) times daily. 22 g 0   nitrofurantoin, macrocrystal-monohydrate, (MACROBID) 100 MG capsule Take 1 capsule (100 mg total) by mouth 2 (two) times daily. 14 capsule 0   omeprazole (PRILOSEC) 40 MG capsule TAKE 1 CAPSULE TWICE DAILY FOR HEARTBURN (Patient taking differently: Take 40 mg by mouth 2 (two) times daily before a meal.) 180 capsule 0   ondansetron  (ZOFRAN) 4 MG tablet Take 1 tablet (4 mg total) by mouth every 8 (eight) hours as needed for nausea or vomiting. 60 tablet 3   topiramate (TOPAMAX) 50 MG tablet Take 1 tablet (50 mg total) by mouth daily. For headache prevention 90 tablet 2   triamcinolone (KENALOG) 0.1 % paste Use as directed 1 Application in the mouth or throat 2 (two) times daily. 5 g 0   valACYclovir (VALTREX) 1000 MG tablet Take 2 tablets twice daily for 1 day. 4 tablet 0   sucralfate (CARAFATE) 1 g tablet Take 1 tablet (1 g total) by mouth 3 (three) times daily before meals. 90 tablet 2   No current facility-administered medications on file prior to visit.     ROS see history of present illness  Objective  Physical Exam Vitals:   03/09/23 1051  BP: 134/80  Pulse: 63  Temp: 98 F (36.7 C)  SpO2: 99%    BP Readings from Last 3 Encounters:  03/09/23 134/80  03/01/23 122/76  02/02/23 136/82   Wt Readings from Last 3 Encounters:  03/09/23 119 lb 12.8 oz (54.3 kg)  03/01/23 119 lb (54 kg)  02/02/23 119 lb (54 kg)    Physical Exam  Right forearm, mild tenderness, no induration  Assessment/Plan: Please see individual problem list.  Cut of forearm Assessment & Plan: Superficial scrape  of right forearm.  No obvious signs of infection.  Discussed that the mild surrounding erythema was likely inflammatory.  Advised to seek medical attention if you develop spreading redness, swelling, purulent drainage, or fevers.  Given that tetanus vaccine is more than 70 years old we will update her Td today.  Orders: -     Td vaccine greater than or equal to 7yo preservative free IM    Return if symptoms worsen or fail to improve.   Marikay Alar, MD [facility]

## 2023-03-19 ENCOUNTER — Telehealth: Payer: Self-pay | Admitting: Primary Care

## 2023-03-19 DIAGNOSIS — Z91018 Allergy to other foods: Secondary | ICD-10-CM

## 2023-03-19 NOTE — Telephone Encounter (Signed)
Noted, referral placed. Please let her know.

## 2023-03-19 NOTE — Telephone Encounter (Addendum)
   Reason for Referral Request: alpha gout  Has patient been seen PCP for this complaint? Yes (per patient)  No,  please schedule patient for appointment for complaint.  Patient scheduled on: (needs referral submitted)  Yes, please find out following information.  Referral for which specialty: [facility]   Preferred office/provider: Dr Eddie Candle   Phone number: [phone] Fax# [phone]

## 2023-03-20 NOTE — Telephone Encounter (Signed)
Patient returned a call from our office, this is most recent note I saw so I notified patient of this.

## 2023-03-23 ENCOUNTER — Encounter: Payer: Self-pay | Admitting: *Deleted

## 2023-04-05 NOTE — Progress Notes (Deleted)
Celso Amy, PA-C [address]  [address]  [place], [place] [address]  Main: [phone]  Fax: [phone]   Primary Care Physician: Doreene Nest, NP  Primary Gastroenterologist:  ***  CC: F/U Chronic Constipation, IBS, GERD, Nausea, Abd Pain.   HPI: Valerie [name] is a 72 y.o. female returns for 69-month follow-up of chronic constipation, IBS, GERD, nausea, and abdominal pain.  GI symptoms for many years.  Extensive negative GI workup.  She does have alpha gal and milk allergy.  Cholecystectomy in 1997.  Current treatment: Ibsrela 50 Mg twice daily, dicyclomine 10 Mg twice daily, Zofran as needed, OTC MiraLAX, Senokot, omeprazole 40 Mg twice daily.  Current symptoms:  -Abdominal pelvic MRI/MRCP 01/25/2023: Previous cholecystectomy, mild postop bile duct dilation, no acute abnormality.  GI Workup:  -Gastric emptying study 11/17/2022: Normal. -EGD 06/2022 by Dr. Tobi Bastos was Normal.  Gastric biopsies negative for H. pylori.  -Abdominal MRI / MRCP 08/2022 showed sludge in the bile duct post cholecystectomy.   -ERCP & EUS by Dr. Meridee Score 09/14/2022 showed 1 cm hiatal hernia, mild gastritis, and sludge / debri removed from biliary and pancreatic duct..  Normal pancreas.  -CT abdomen pelvis with contrast 09/15/2022 showed mild hyperdensity in the distal common bile duct, likely reflecting sludge post recent ERCP.  Otherwise no acute abnormality.  No masses.  No bowel inflammation.  Previous hysterectomy, appendectomy, and cholecystectomy.  -Labs 11/28/2022 showed normal CBC, CMP, and lipase.  Normal LFTs. -Colonoscopy 07/2020 normal.  Excellent prep.  Small internal hemorrhoids.  Repeat in 10 years. -24 hour pH manometry test in 2017 on PPI which was normal, suggesting functional heartburn - no pathologic acid reflux.   Current Outpatient Medications  Medication Sig Dispense Refill   acetaminophen (TYLENOL) 325 MG tablet Take 325 mg by mouth 2 (two) times daily as needed (pain or  headaches).     atorvastatin (LIPITOR) 10 MG tablet Take 1 tablet (10 mg total) by mouth daily. for cholesterol. 90 tablet 0   azelastine (ASTELIN) 0.1 % nasal spray Place 1 spray into both nostrils 2 (two) times daily. Use in each nostril as directed 30 mL 5   CALCIUM-VITAMIN D PO Take 2 tablets by mouth daily.     cetirizine (ZYRTEC ALLERGY) 10 MG tablet Take 1 tablet (10 mg total) by mouth daily. 30 tablet 5   cyclobenzaprine (FLEXERIL) 5 MG tablet Take 1 tablet (5 mg total) by mouth at bedtime as needed for muscle spasms. 15 tablet 0   dicyclomine (BENTYL) 10 MG capsule Take 1 capsule (10 mg total) by mouth 3 (three) times daily before meals. 90 capsule 5   EPINEPHrine 0.3 mg/0.3 mL IJ SOAJ injection Inject 0.3 mg into the muscle as directed. 2 each 1   fluticasone (FLONASE) 50 MCG/ACT nasal spray USE 2 SPRAYS IN EACH NOSTRIL EVERY DAY AS NEEDED FOR ALLERGIES OR RHINITIS 48 g 2   mupirocin ointment (BACTROBAN) 2 % Apply 1 Application topically 2 (two) times daily. 22 g 0   nitrofurantoin, macrocrystal-monohydrate, (MACROBID) 100 MG capsule Take 1 capsule (100 mg total) by mouth 2 (two) times daily. 14 capsule 0   omeprazole (PRILOSEC) 40 MG capsule TAKE 1 CAPSULE TWICE DAILY FOR HEARTBURN (Patient taking differently: Take 40 mg by mouth 2 (two) times daily before a meal.) 180 capsule 0   ondansetron (ZOFRAN) 4 MG tablet Take 1 tablet (4 mg total) by mouth every 8 (eight) hours as needed for nausea or vomiting. 60 tablet 3  sucralfate (CARAFATE) 1 g tablet Take 1 tablet (1 g total) by mouth 3 (three) times daily before meals. 90 tablet 2   topiramate (TOPAMAX) 50 MG tablet Take 1 tablet (50 mg total) by mouth daily. For headache prevention 90 tablet 2   triamcinolone (KENALOG) 0.1 % paste Use as directed 1 Application in the mouth or throat 2 (two) times daily. 5 g 0   valACYclovir (VALTREX) 1000 MG tablet Take 2 tablets twice daily for 1 day. 4 tablet 0   No current facility-administered  medications for this visit.    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   Anal fissure    Anxiety    Atrophic vaginitis    COVID-19 virus infection 09/28/2020   Dehydration    Difficult intubation    limited neck mobility   Dysuria 08/13/2020   Esophagitis    Hernia    Present to proximal to umbillical    History of hiatal hernia    Hyperlipidemia    Hypertension    Hypokalemia 09/15/2022   Internal hemorrhoids    Intractable abdominal pain 09/15/2022   Lactic acidosis    Menorrhagia    Osteopenia    Partial small bowel obstruction (HCC) 01/26/2021   PONV (postoperative nausea and vomiting)    Recurrent UTI    Vertigo    Vitamin D deficiency     Past Surgical History:  Procedure Laterality Date   44 HOUR PH STUDY N/A 11/29/2015   Procedure: 24 HOUR PH STUDY;  Surgeon: Ruffin Frederick, MD;  Location: [facility];  Service: Gastroenterology;  Laterality: N/A;   ABDOMINAL HYSTERECTOMY  12/04/2000   also bilateral salpingo-oophorectomy   CARDIAC CATHETERIZATION  2006   CARPAL TUNNEL RELEASE  11/20/2011   Procedure: CARPAL TUNNEL RELEASE;  Surgeon: Mariam Dollar, MD;  Location: [facility];  Service: Neurosurgery;  Laterality: Left;  LEFT carpal tunnel release   CERVICAL SPINE SURGERY  2013   [facility]   CHOLECYSTECTOMY  1997   COLONOSCOPY WITH PROPOFOL N/A 07/27/2020   Procedure: COLONOSCOPY WITH PROPOFOL;  Surgeon: Midge Minium, MD;  Location: [facility] ENDOSCOPY;  Service:  Endoscopy;  Laterality: N/A;   DILATION AND CURETTAGE OF UTERUS     11/15/2000   ENDOSCOPIC RETROGRADE CHOLANGIOPANCREATOGRAPHY (ERCP) WITH PROPOFOL N/A 09/14/2022   Procedure: ENDOSCOPIC RETROGRADE CHOLANGIOPANCREATOGRAPHY (ERCP) WITH PROPOFOL;  Surgeon: Meridee Score Netty Starring., MD;  Location: [facility];  Service: Gastroenterology;  Laterality: N/A;   ESOPHAGEAL MANOMETRY N/A 09/20/2015   Procedure: ESOPHAGEAL MANOMETRY (EM);  Surgeon: Ruffin Frederick, MD;  Location: [facility];  Service: Gastroenterology;  Laterality: N/A;   ESOPHAGOGASTRODUODENOSCOPY (EGD) WITH PROPOFOL N/A 05/17/2021   Procedure: ESOPHAGOGASTRODUODENOSCOPY (EGD) WITH PROPOFOL;  Surgeon: Pasty Spillers, MD;  Location: [facility] ENDOSCOPY;  Service: Endoscopy;  Laterality: N/A;   ESOPHAGOGASTRODUODENOSCOPY (EGD) WITH PROPOFOL N/A 07/17/2022   Procedure: ESOPHAGOGASTRODUODENOSCOPY (EGD) WITH PROPOFOL;  Surgeon: Wyline Mood, MD;  Location: [facility];  Service: Gastroenterology;  Laterality: N/A;   ESOPHAGOGASTRODUODENOSCOPY (EGD) WITH PROPOFOL N/A 09/14/2022   Procedure: ESOPHAGOGASTRODUODENOSCOPY (EGD) WITH PROPOFOL;  Surgeon: Lemar Lofty., MD;  Location: [facility];  Service: Gastroenterology;  Laterality: N/A;   EUS N/A 09/14/2022   Procedure: UPPER ENDOSCOPIC ULTRASOUND (EUS) RADIAL;  Surgeon: Lemar Lofty., MD;  Location: [facility];  Service: Gastroenterology;  Laterality: N/A;   HYSTEROSCOPY  11/15/2000   D & C, resection of endometrial polyps   LUMBAR SPINAL CORD SIMULATOR LEAD REMOVAL N/A 08/01/2021   Procedure: REMOVAL OF SPINAL CORD STIMULATOR;  Surgeon: Venetia Night, MD;  Location: [facility] ORS;  Service: Neurosurgery;  Laterality: N/A;   OOPHORECTOMY  12/04/2000   bilateral salpingo-oophorectomy done with TAH   REMOVAL OF STONES  09/14/2022   Procedure: REMOVAL OF STONES;  Surgeon: Meridee Score Netty Starring., MD;  Location: [facility];  Service: Gastroenterology;;    Dennison Mascot  09/14/2022   Procedure: Dennison Mascot;  Surgeon: Mansouraty, Netty Starring., MD;  Location: [facility];  Service: Gastroenterology;;   SPINAL CORD STIMULATOR INSERTION Bilateral 04/12/2020   Procedure: INSERTION CERVICAL SPINAL STIMULATOR PULSE GENERATOR;  Surgeon: Lucy Chris, MD;  Location: [facility];  Service: Neurosurgery;  Laterality: Bilateral;   SPINAL CORD STIMULATOR TRIAL N/A 03/22/2020   Procedure: CERVICAL SPINAL CORD STIMULATOR TRIAL PERCUTANEOUS;  Surgeon: Lucy Chris, MD;  Location: [facility] ORS;  Service: Neurosurgery;  Laterality: N/A;   TUBAL LIGATION      Review of Systems:    All systems reviewed and negative except where noted in HPI.   Physical Examination:   There were no vitals taken for this visit.  General: Well-nourished, well-developed in no acute distress.  Lungs: Clear to auscultation bilaterally. Non-labored. Heart: Regular rate and rhythm, no murmurs rubs or gallops.  Abdomen: Bowel sounds are normal; Abdomen is Soft; No hepatosplenomegaly, masses or hernias;  No Abdominal Tenderness; No guarding or rebound tenderness. Neuro: Alert and oriented x 3.  Grossly intact.  Psych: Alert and cooperative, normal mood and affect.   Imaging Studies: No results found.  Assessment and Plan:   Valerie [name] is a 72 y.o. y/o female ***    Celso Amy, PA-C  Follow up ***  BP check ***

## 2023-04-06 ENCOUNTER — Ambulatory Visit: Payer: Medicare HMO | Admitting: Physician Assistant

## 2023-04-06 ENCOUNTER — Telehealth: Payer: Self-pay | Admitting: Physician Assistant

## 2023-04-06 ENCOUNTER — Other Ambulatory Visit: Payer: Self-pay | Admitting: Primary Care

## 2023-04-06 DIAGNOSIS — K130 Diseases of lips: Secondary | ICD-10-CM

## 2023-04-06 DIAGNOSIS — G8929 Other chronic pain: Secondary | ICD-10-CM

## 2023-04-06 DIAGNOSIS — R1084 Generalized abdominal pain: Secondary | ICD-10-CM

## 2023-04-06 MED ORDER — CYCLOBENZAPRINE HCL 5 MG PO TABS: 5.0000 mg | ORAL_TABLET | Freq: Every evening | ORAL | 0 refills | Status: AC | PRN

## 2023-04-06 NOTE — Telephone Encounter (Signed)
Pt called to reschedule appointment has flat tire

## 2023-04-09 ENCOUNTER — Telehealth: Payer: Self-pay

## 2023-04-10 NOTE — Telephone Encounter (Signed)
error 

## 2023-04-16 ENCOUNTER — Other Ambulatory Visit: Payer: Self-pay

## 2023-04-18 ENCOUNTER — Ambulatory Visit: Payer: Medicare HMO | Admitting: Physician Assistant

## 2023-04-25 ENCOUNTER — Other Ambulatory Visit: Payer: Self-pay | Admitting: Primary Care

## 2023-04-25 DIAGNOSIS — G8929 Other chronic pain: Secondary | ICD-10-CM

## 2023-05-15 DIAGNOSIS — Z20822 Contact with and (suspected) exposure to covid-19: Secondary | ICD-10-CM | POA: Diagnosis not present

## 2023-05-15 DIAGNOSIS — R059 Cough, unspecified: Secondary | ICD-10-CM | POA: Diagnosis not present

## 2023-05-15 DIAGNOSIS — R519 Headache, unspecified: Secondary | ICD-10-CM | POA: Diagnosis not present

## 2023-05-15 DIAGNOSIS — R1110 Vomiting, unspecified: Secondary | ICD-10-CM | POA: Diagnosis not present

## 2023-05-16 ENCOUNTER — Other Ambulatory Visit: Payer: Self-pay | Admitting: Physician Assistant

## 2023-05-16 DIAGNOSIS — R110 Nausea: Secondary | ICD-10-CM

## 2023-05-16 DIAGNOSIS — E785 Hyperlipidemia, unspecified: Secondary | ICD-10-CM

## 2023-05-16 DIAGNOSIS — K2100 Gastro-esophageal reflux disease with esophagitis, without bleeding: Secondary | ICD-10-CM

## 2023-05-16 DIAGNOSIS — Z91014 Allergy to mammalian meats: Secondary | ICD-10-CM | POA: Diagnosis not present

## 2023-05-16 DIAGNOSIS — K5229 Other allergic and dietetic gastroenteritis and colitis: Secondary | ICD-10-CM | POA: Diagnosis not present

## 2023-05-16 DIAGNOSIS — Z91018 Allergy to other foods: Secondary | ICD-10-CM | POA: Diagnosis not present

## 2023-05-16 NOTE — Telephone Encounter (Signed)
Rx approved

## 2023-05-19 DIAGNOSIS — R059 Cough, unspecified: Secondary | ICD-10-CM | POA: Diagnosis not present

## 2023-05-19 DIAGNOSIS — R519 Headache, unspecified: Secondary | ICD-10-CM | POA: Diagnosis not present

## 2023-05-19 DIAGNOSIS — Z20822 Contact with and (suspected) exposure to covid-19: Secondary | ICD-10-CM | POA: Diagnosis not present

## 2023-05-19 DIAGNOSIS — R1110 Vomiting, unspecified: Secondary | ICD-10-CM | POA: Diagnosis not present

## 2023-05-23 DIAGNOSIS — R059 Cough, unspecified: Secondary | ICD-10-CM | POA: Diagnosis not present

## 2023-05-23 DIAGNOSIS — R1110 Vomiting, unspecified: Secondary | ICD-10-CM | POA: Diagnosis not present

## 2023-05-23 DIAGNOSIS — R519 Headache, unspecified: Secondary | ICD-10-CM | POA: Diagnosis not present

## 2023-05-23 DIAGNOSIS — Z20822 Contact with and (suspected) exposure to covid-19: Secondary | ICD-10-CM | POA: Diagnosis not present

## 2023-05-26 DIAGNOSIS — R059 Cough, unspecified: Secondary | ICD-10-CM | POA: Diagnosis not present

## 2023-05-26 DIAGNOSIS — Z20822 Contact with and (suspected) exposure to covid-19: Secondary | ICD-10-CM | POA: Diagnosis not present

## 2023-05-26 DIAGNOSIS — R519 Headache, unspecified: Secondary | ICD-10-CM | POA: Diagnosis not present

## 2023-05-26 DIAGNOSIS — R1110 Vomiting, unspecified: Secondary | ICD-10-CM | POA: Diagnosis not present

## 2023-05-28 MED ORDER — ATORVASTATIN CALCIUM 10 MG PO TABS: 10.0000 mg | ORAL_TABLET | Freq: Every day | ORAL | 1 refills | Status: DC

## 2023-05-28 NOTE — Telephone Encounter (Signed)
Refills sent to pharmacy. 

## 2023-05-28 NOTE — Telephone Encounter (Signed)
Copied from CRM [id number]. Topic: Clinical - Medication Refill >> May 28, 2023  8:47 AM Orinda Kenner C wrote: Most Recent Primary Care Visit:  Provider: Birdie Sons, ERIC G  Department: [facility]  Visit Type: SAME DAY  Date: 03/09/2023  Medication: atorvastatin (LIPITOR) 10 MG tablet   Has the patient contacted their pharmacy? No (Agent: If no, request that the patient contact the pharmacy for the refill. If patient does not wish to contact the pharmacy document the reason why and proceed with request.) (Agent: If yes, when and what did the pharmacy advise?)  Is this the correct pharmacy for this prescription? Yes If no, delete pharmacy and type the correct one.  This is the patient's preferred pharmacy:  [facility] [address] [place] - [address] [place] [address] Phone: [phone] Fax: [phone]  Has the prescription been filled recently?   Is the patient out of the medication? Yes  Has the patient been seen for an appointment in the last year OR does the patient have an upcoming appointment?   Can we respond through MyChart? No, pls c/b [phone]  Agent: Please be advised that Rx refills may take up to 3 business days. We ask that you follow-up with your pharmacy.

## 2023-05-30 DIAGNOSIS — R519 Headache, unspecified: Secondary | ICD-10-CM | POA: Diagnosis not present

## 2023-05-30 DIAGNOSIS — R1110 Vomiting, unspecified: Secondary | ICD-10-CM | POA: Diagnosis not present

## 2023-05-30 DIAGNOSIS — Z20822 Contact with and (suspected) exposure to covid-19: Secondary | ICD-10-CM | POA: Diagnosis not present

## 2023-05-30 DIAGNOSIS — R059 Cough, unspecified: Secondary | ICD-10-CM | POA: Diagnosis not present

## 2023-06-02 DIAGNOSIS — R059 Cough, unspecified: Secondary | ICD-10-CM | POA: Diagnosis not present

## 2023-06-02 DIAGNOSIS — R519 Headache, unspecified: Secondary | ICD-10-CM | POA: Diagnosis not present

## 2023-06-02 DIAGNOSIS — R1110 Vomiting, unspecified: Secondary | ICD-10-CM | POA: Diagnosis not present

## 2023-06-02 DIAGNOSIS — Z20822 Contact with and (suspected) exposure to covid-19: Secondary | ICD-10-CM | POA: Diagnosis not present

## 2023-06-05 ENCOUNTER — Other Ambulatory Visit: Payer: Self-pay

## 2023-06-05 DIAGNOSIS — E785 Hyperlipidemia, unspecified: Secondary | ICD-10-CM

## 2023-06-05 NOTE — Telephone Encounter (Signed)
 Please call patient:  Received refill request for atorvastatin cholesterol pill from [facility] mail order pharmacy.  I sent plenty of refills on 05/28/2023 to [facility] pharmacy.  Which pharmacy is she using?

## 2023-06-06 NOTE — Telephone Encounter (Signed)
 Called and spoke with patient she did pick up a 90 day supply from [facility] recently but would like future refills sent to [facility]. She will let us know when she has 1-2 weeks left of pills.

## 2023-06-06 NOTE — Telephone Encounter (Signed)
 Noted.

## 2023-06-07 ENCOUNTER — Ambulatory Visit: Payer: Medicare HMO | Admitting: Primary Care

## 2023-06-07 DIAGNOSIS — R519 Headache, unspecified: Secondary | ICD-10-CM | POA: Diagnosis not present

## 2023-06-07 DIAGNOSIS — Z20822 Contact with and (suspected) exposure to covid-19: Secondary | ICD-10-CM | POA: Diagnosis not present

## 2023-06-07 DIAGNOSIS — R1110 Vomiting, unspecified: Secondary | ICD-10-CM | POA: Diagnosis not present

## 2023-06-07 DIAGNOSIS — R059 Cough, unspecified: Secondary | ICD-10-CM | POA: Diagnosis not present

## 2023-06-10 DIAGNOSIS — R519 Headache, unspecified: Secondary | ICD-10-CM | POA: Diagnosis not present

## 2023-06-10 DIAGNOSIS — Z20822 Contact with and (suspected) exposure to covid-19: Secondary | ICD-10-CM | POA: Diagnosis not present

## 2023-06-10 DIAGNOSIS — R1110 Vomiting, unspecified: Secondary | ICD-10-CM | POA: Diagnosis not present

## 2023-06-10 DIAGNOSIS — R059 Cough, unspecified: Secondary | ICD-10-CM | POA: Diagnosis not present

## 2023-06-10 NOTE — Progress Notes (Signed)
 Mariel Lukins T. Gerarda Conklin, MD, CAQ [facility] [address] [place], [address]  Phone: [phone]  FAX: [phone]  Valerie [name] - 73 y.o. female  MRN [id number]  Date of Birth: [birth year]  Date: 06/11/2023  PCP: Gretta Comer MARLA, NP  Referral: Gretta Comer MARLA, NP  Chief Complaint  Patient presents with   Abnormal Urine Odor   Urinary Incontinence   Subjective:   Valerie [name] is a 73 y.o. very pleasant female patient with Body mass index is 20.01 kg/m. who presents with the following:  The patient presents with ongoing urinary discomfort and pressure in her kidney region.  She has been feeling bad and symptomatic for roughly 2 weeks, and this is the first time she has sought medical attention.  Urine has had a bad odor to it and has felt as if it was strong, and she also thinks there has been a little bit of blood mixed in with the urine.  She does have urgency as well as some dysuria She does feel a pressure sensation in the hypogastric region and bladder  She also has pain in the mid back on the flank that is new or different from her baseline   Review of Systems is noted in the HPI, as appropriate  Objective:   BP 120/78 (BP Location: Left Arm, Patient Position: Sitting, Cuff Size: Normal)   Pulse (!) 57   Temp 98.2 F (36.8 C) (Temporal)   Ht 5' 5 (1.651 m)   Wt 120 lb 4 oz (54.5 kg)   SpO2 98%   BMI 20.01 kg/m   GEN: WDWN HEENT: Atraumatc, normocephalic. CV: RRR, No M/G/R PULM: CTA B, No wheezes, crackles, or rhonchi ABD: S, tender to palpation in the hypogastric region, ND, +BS, no rebound. + CVAT. No suprapubic tenderness.   Laboratory and Imaging Data: Results for orders placed or performed in visit on 06/11/23  POCT Urinalysis Dipstick (Automated)   Collection Time: 06/11/23  9:29 AM  Result Value Ref Range   Color, UA Yellow    Clarity, UA Hazy    Glucose, UA Negative Negative    Bilirubin, UA Negative    Ketones, UA Negative    Spec Grav, UA 1.010 1.010 - 1.025   Blood, UA Moderate (2+)    pH, UA 6.0 5.0 - 8.0   Protein, UA Negative Negative   Urobilinogen, UA 0.2 0.2 or 1.0 E.U./dL   Nitrite, UA Positive    Leukocytes, UA Large (3+) (A) Negative     Assessment and Plan:     ICD-10-CM   1. Pyelonephritis  N12 ciprofloxacin  (CIPRO ) 500 MG tablet    2. Dysuria  R30.0 POCT Urinalysis Dipstick (Automated)    Urine Culture     2 weeks of urinary tract infection symptoms with hypogastric abdominal pain and CVA tenderness.  Concern for pyelonephritis, and I am going to treat her with high-dose flora quinolone and twice daily dosing for 1 week.  Medication Management during today's office visit: Meds ordered this encounter  Medications   ciprofloxacin  (CIPRO ) 500 MG tablet    Sig: Take 1 tablet (500 mg total) by mouth 2 (two) times daily for 7 days.    Dispense:  14 tablet    Refill:  0   Medications Discontinued During This Encounter  Medication Reason   ondansetron  (ZOFRAN ) 4 MG tablet Dose change    Orders placed today for conditions managed today: Orders Placed This Encounter  Procedures   Urine Culture   POCT Urinalysis Dipstick (Automated)    Disposition: No follow-ups on file.  [employer] One speech-to-text software was used for transcription in this dictation.  Possible transcriptional errors can occur using Animal nutritionist.   Signed,  Jacques DASEN. Lenvil Swaim, MD   Outpatient Encounter Medications as of 06/11/2023  Medication Sig   acetaminophen  (TYLENOL ) 325 MG tablet Take 325 mg by mouth 2 (two) times daily as needed (pain or headaches).   amLODipine  (NORVASC ) 5 MG tablet Take 5 mg by mouth daily.   atorvastatin  (LIPITOR) 10 MG tablet Take 1 tablet (10 mg total) by mouth daily. for cholesterol.   azelastine  (ASTELIN ) 0.1 % nasal spray Place 1 spray into both nostrils 2 (two) times daily. Use in each nostril as directed    CALCIUM -VITAMIN D  PO Take 2 tablets by mouth daily.   cetirizine  (ZYRTEC  ALLERGY ) 10 MG tablet Take 1 tablet (10 mg total) by mouth daily.   ciprofloxacin  (CIPRO ) 500 MG tablet Take 1 tablet (500 mg total) by mouth 2 (two) times daily for 7 days.   cromolyn (GASTROCROM) 100 MG/5ML solution Take 200 mg by mouth 4 (four) times daily -  before meals and at bedtime.   cyclobenzaprine  (FLEXERIL ) 5 MG tablet Take 1 tablet (5 mg total) by mouth at bedtime as needed for muscle spasms.   dicyclomine  (BENTYL ) 10 MG capsule Take 1 capsule (10 mg total) by mouth 3 (three) times daily before meals.   EPINEPHrine  0.3 mg/0.3 mL IJ SOAJ injection Inject 0.3 mg into the muscle as directed.   fluticasone  (FLONASE ) 50 MCG/ACT nasal spray USE 2 SPRAYS IN EACH NOSTRIL EVERY DAY AS NEEDED FOR ALLERGIES OR RHINITIS   mupirocin  ointment (BACTROBAN ) 2 % Apply 1 Application topically 2 (two) times daily.   nitrofurantoin , macrocrystal-monohydrate, (MACROBID ) 100 MG capsule Take 1 capsule (100 mg total) by mouth 2 (two) times daily.   omeprazole  (PRILOSEC) 40 MG capsule TAKE 1 CAPSULE TWICE DAILY FOR HEARTBURN (Patient taking differently: Take 40 mg by mouth 2 (two) times daily before a meal.)   ondansetron  (ZOFRAN -ODT) 8 MG disintegrating tablet Take 8 mg by mouth every 12 (twelve) hours as needed.   sucralfate  (CARAFATE ) 1 g tablet TAKE 1 TABLET THREE TIMES DAILY BEFORE MEALS   topiramate  (TOPAMAX ) 50 MG tablet Take 1 tablet (50 mg total) by mouth daily. For headache prevention   triamcinolone  (KENALOG ) 0.1 % paste Use as directed 1 Application in the mouth or throat 2 (two) times daily.   valACYclovir  (VALTREX ) 1000 MG tablet Take 2 tablets twice daily for 1 day.   valsartan  (DIOVAN ) 160 MG tablet Take 160 mg by mouth daily.   [DISCONTINUED] ondansetron  (ZOFRAN ) 4 MG tablet Take 1 tablet (4 mg total) by mouth every 8 (eight) hours as needed for nausea or vomiting.   No facility-administered encounter medications on file  as of 06/11/2023.

## 2023-06-11 ENCOUNTER — Encounter: Payer: Self-pay | Admitting: Family Medicine

## 2023-06-11 ENCOUNTER — Ambulatory Visit (INDEPENDENT_AMBULATORY_CARE_PROVIDER_SITE_OTHER): Payer: Medicare HMO | Admitting: Family Medicine

## 2023-06-11 VITALS — BP 120/78 | HR 57 | Temp 98.2°F | Ht 65.0 in | Wt 120.2 lb

## 2023-06-11 DIAGNOSIS — R300 Dysuria: Secondary | ICD-10-CM | POA: Diagnosis not present

## 2023-06-11 DIAGNOSIS — N12 Tubulo-interstitial nephritis, not specified as acute or chronic: Secondary | ICD-10-CM | POA: Diagnosis not present

## 2023-06-11 LAB — POC URINALSYSI DIPSTICK (AUTOMATED)
Bilirubin, UA: NEGATIVE
Glucose, UA: NEGATIVE
Ketones, UA: NEGATIVE
Nitrite, UA: POSITIVE
Protein, UA: NEGATIVE
Spec Grav, UA: 1.01 (ref 1.010–1.025)
Urobilinogen, UA: 0.2 U/dL
pH, UA: 6 (ref 5.0–8.0)

## 2023-06-11 MED ORDER — CIPROFLOXACIN HCL 500 MG PO TABS
500.0000 mg | ORAL_TABLET | Freq: Two times a day (BID) | ORAL | 0 refills | Status: AC
Start: 2023-06-11 — End: 2023-06-18

## 2023-06-13 LAB — URINE CULTURE
MICRO NUMBER:: 15946951
SPECIMEN QUALITY:: ADEQUATE

## 2023-06-27 ENCOUNTER — Ambulatory Visit: Payer: Self-pay | Admitting: Primary Care

## 2023-06-27 NOTE — Telephone Encounter (Signed)
Chief Complaint: constipation Symptoms: N/V, foul smelling gas, abdominal pain, loss of energy Frequency: 3 weeks Pertinent Negatives: Patient denies rectal bleeding, current cancer tx Disposition: [x] ED /[] Urgent Care (no appt availability in office) / [] Appointment(In office/virtual)/ []  [facility]/ [] Home Care/ [] Refused Recommended Disposition /[] [facility]/ []  Follow-up with PCP Additional Notes: Patient c/o constipation x 3 weeks. Has been taking senakot and laxative, both twice daily with no relief. Reports that she has loss of energy, N/V/abdominal pain-- endorses vomit is sometimes brown, coffee ground-like. Advised ED for further evaluation based on last sx. Of note, patient reports hx of "blockage". Patient verbalized understanding and to call back with worsening symptoms.    Copied from CRM [id number]. Topic: Clinical - Red Word Triage >> Jun 27, 2023  2:34 PM Sonny Dandy B wrote: Red Word that prompted transfer to Nurse Triage: pt called, states she has not moved her bowels in 3 weeks. Reason for Disposition  [1] Vomiting AND [2] contains bile (green color)  Answer Assessment - Initial Assessment Questions 1. STOOL PATTERN OR FREQUENCY: "How often do you have a bowel movement (BM)?"  (Normal range: 3 times a day to every 3 days)  "When was your last BM?"       3 weeks Normally, 1-2x a week 2. STRAINING: "Do you have to strain to have a BM?"      At times 3. RECTAL PAIN: "Does your rectum hurt when the stool comes out?" If Yes, ask: "Do you have hemorrhoids? How bad is the pain?"  (Scale 1-10; or mild, moderate, severe)     Right now I have some rectal pain 4/10 with sitting. I do have hemorrhoids at times, but I don't have bleeding. Also c/o stomach pain and nausea 4. STOOL COMPOSITION: "Are the stools hard?"      yes 5. BLOOD ON STOOLS: "Has there been any blood on the toilet tissue or on the surface of the BM?" If Yes, ask: "When was the last time?"      Not that I can see 6. CHRONIC CONSTIPATION: "Is this a new problem for you?"  If No, ask: "How long have you had this problem?" (days, weeks, months)      Yes at times 7. CHANGES IN DIET OR HYDRATION: "Have there been any recent changes in your diet?" "How much fluids are you drinking on a daily basis?"  "How much have you had to drink today?"     I'm drinking fluids-- I could be dehydrated, I dont have a lot of energy. Diet has not changed 8. MEDICINES: "Have you been taking any new medicines?" "Are you taking any narcotic pain medicines?" (e.g., Dilaudid, morphine, Percocet, Vicodin)     No narcotics 9. LAXATIVES: "Have you been using any stool softeners, laxatives, or enemas?"  If Yes, ask "What, how often, and when was the last time?"     Laxative 2x daily Been taking senakot twice daily 10. ACTIVITY:  "How much walking do you do every day?"  "Has your activity level decreased in the past week?"        I walk a lot 11. CAUSE: "What do you think is causing the constipation?"        I dont know 12. OTHER SYMPTOMS: "Do you have any other symptoms?" (e.g., abdomen pain, bloating, fever, vomiting)       Abdominal pain, N/V, smelly gas Reports vomit is a "little brown" - denies eating brown foods, sometimes looks like coffee grounds 13.  MEDICAL HISTORY: "Do you have a history of hemorrhoids, rectal fissures, or rectal surgery or rectal abscess?"         Hemorrhoids  Protocols used: Constipation-A-AH

## 2023-06-28 ENCOUNTER — Telehealth: Payer: Self-pay | Admitting: Physician Assistant

## 2023-06-28 NOTE — Telephone Encounter (Signed)
Called and clarified with patient Valerie [name] message. Scheduled patient appt with Jae Dire 06/29/23 @ 3pm per patient request. She stated she would try to give GI office a call as well.

## 2023-06-28 NOTE — Telephone Encounter (Signed)
Noted.  Agree with disposition.  I do not see where she went to the emergency department. Can we call to check on her?  Needs evaluation ASAP.

## 2023-06-28 NOTE — Telephone Encounter (Signed)
Pt requesting call back has a question

## 2023-06-28 NOTE — Telephone Encounter (Signed)
Called and spoke with patient she stated she was feeling about the same with slightly less nausea. Advised patient of Isabella Stalling recommendation. Strongly advised she be evaluated at ED. Patient verbalized understanding and stated she will go to ED.

## 2023-06-28 NOTE — Telephone Encounter (Signed)
I do not mean that she has to go to the ED but that she needs to be seen anywhere.  She can call her GI doctor to see what they think.  I am also happy to see her at her earliest convenience.

## 2023-06-29 ENCOUNTER — Encounter: Payer: Self-pay | Admitting: Primary Care

## 2023-06-29 ENCOUNTER — Ambulatory Visit (INDEPENDENT_AMBULATORY_CARE_PROVIDER_SITE_OTHER): Payer: Medicare HMO | Admitting: Primary Care

## 2023-06-29 ENCOUNTER — Telehealth: Payer: Self-pay

## 2023-06-29 ENCOUNTER — Ambulatory Visit (INDEPENDENT_AMBULATORY_CARE_PROVIDER_SITE_OTHER)
Admission: RE | Admit: 2023-06-29 | Discharge: 2023-06-29 | Disposition: A | Discharge status: Home / Self Care | Payer: Medicare HMO | Source: Ambulatory Visit | Attending: Primary Care | Admitting: Primary Care

## 2023-06-29 VITALS — BP 134/88 | HR 63 | Temp 97.2°F | Ht 65.0 in | Wt 122.0 lb

## 2023-06-29 DIAGNOSIS — K5909 Other constipation: Secondary | ICD-10-CM | POA: Diagnosis not present

## 2023-06-29 DIAGNOSIS — K5900 Constipation, unspecified: Secondary | ICD-10-CM | POA: Diagnosis not present

## 2023-06-29 NOTE — Assessment & Plan Note (Signed)
Deteriorated with no bowel movement in 3 weeks. Fortunately, she had a small movement yesterday.  Rectal exam today without impaction. Great bowel sounds noted throughout.  Checking plain films of the abdomen today.  Increase MiraLAX to twice daily dosing, continue Senokot, Isbrella 50 mg twice daily. Recommended to try another enema tomorrow if no bowel movement.  Follow-up with GI early next week as scheduled.

## 2023-06-29 NOTE — Patient Instructions (Signed)
Increase your MiraLAX to twice daily.  Try another enema tomorrow if you cannot have a bowel movement by then.  Continue all other medications.  Ensure you are consuming 64 ounces of water daily.  Follow-up with GI as scheduled.  It was a pleasure to see you today!

## 2023-06-29 NOTE — Telephone Encounter (Signed)
Patient stated she is constipated-tried senokot. Has some nausea. She is seeing her NP today. She was added to scheduled 07/03/23 to see Inetta Fermo.

## 2023-06-29 NOTE — Progress Notes (Signed)
Subjective:    Patient ID: Valerie [name], female    DOB: [birth year], 73 y.o.   MRN: [id number]  Constipation Associated symptoms include abdominal pain, nausea and vomiting. Pertinent negatives include no fever.    FRANCI OSHANA is a very pleasant 73 y.o. female with a history of hypertension, hemorrhoids, GERD, chronic constipation, gastric erythema, choledocholithiasis, chronic nausea, chronic abdominal pain who presents today to discuss constipation.  She contacted our office several days ago with reports of no bowel movement in 3 weeks.  Given this information she was advised to come to our office for evaluation.  Today she discusses that she had a small bowel movement yesterday with loose stool. No bowel movement today. Follows with GI and is managed on Senokot 2 daily, Isbrella 50 mg BID. She's also been taking Miralax once daily, she completed a home enema 2-3 days ago and did not have a bowel movement.   In general, she has bowel movements once weekly. She is currently passing gas. Her appetite is normal. She denies rectal heaviness.  She is drinking about 3-4 bottles of water a day.  She continues with chronic nausea, has had some vomiting.  She has an appointment scheduled with GI early next week.  Wt Readings from Last 3 Encounters:  06/29/23 122 lb (55.3 kg)  06/11/23 120 lb 4 oz (54.5 kg)  03/09/23 119 lb 12.8 oz (54.3 kg)      Review of Systems  Constitutional:  Negative for fever.  Gastrointestinal:  Positive for abdominal distention, abdominal pain, constipation, nausea and vomiting. Negative for blood in stool.         Past Medical History:  Diagnosis Date   Acid reflux    Acute cystitis with hematuria 12/08/2020   Adenocarcinoma of the endometrium/uterus (HCC) 12/04/2000   Anal fissure    Anxiety    Atrophic vaginitis    COVID-19 virus infection 09/28/2020   Dehydration    Difficult intubation    limited neck mobility   Dysuria 08/13/2020   Esophagitis     Hernia    Present to proximal to umbillical    History of hiatal hernia    Hyperlipidemia    Hypertension    Hypokalemia 09/15/2022   Internal hemorrhoids    Intractable abdominal pain 09/15/2022   Lactic acidosis    Menorrhagia    Osteopenia    Partial small bowel obstruction (HCC) 01/26/2021   PONV (postoperative nausea and vomiting)    Recurrent UTI    Vertigo    Vitamin D deficiency     Social History   Socioeconomic History   Marital status: Married    Spouse name: Not on file   Number of children: Not on file   Years of education: Not on file   Highest education level: Not on file  Occupational History   Not on file  Tobacco Use   Smoking status: Never    Passive exposure: Never   Smokeless tobacco: Never  Vaping Use   Vaping status: Never Used  Substance and Sexual Activity   Alcohol use: No   Drug use: Never   Sexual activity: Yes    Birth control/protection: Post-menopausal, Surgical  Other Topics Concern   Not on file  Social History Narrative   Married   Lives in [place]   Has 2 children, 4 grandchildren   Enjoys walking, shopping.   Social Drivers of Corporate investment banker Strain: Low Risk  (09/12/2022)   Overall Financial Resource  Strain (CARDIA)    Difficulty of Paying Living Expenses: Not hard at all  Food Insecurity: No Food Insecurity (09/18/2022)   Hunger Vital Sign    Worried About Running Out of Food in the Last Year: Never true    Ran Out of Food in the Last Year: Never true  Transportation Needs: No Transportation Needs (09/18/2022)   PRAPARE - Administrator, Civil Service (Medical): No    Lack of Transportation (Non-Medical): No  Physical Activity: Inactive (09/12/2022)   Exercise Vital Sign    Days of Exercise per Week: 0 days    Minutes of Exercise per Session: 0 min  Stress: No Stress Concern Present (09/12/2022)   [employer] of Occupational Health - Occupational Stress Questionnaire    Feeling of  Stress : Not at all  Social Connections: Moderately Integrated (09/12/2022)   Social Connection and Isolation Panel [NHANES]    Frequency of Communication with Friends and Family: More than three times a week    Frequency of Social Gatherings with Friends and Family: More than three times a week    Attends Religious Services: More than 4 times per year    Active Member of [employer] or Organizations: No    Attends Banker Meetings: Never    Marital Status: Married  Catering manager Violence: Not At Risk (09/15/2022)   Humiliation, Afraid, Rape, and Kick questionnaire    Fear of Current or Ex-Partner: No    Emotionally Abused: No    Physically Abused: No    Sexually Abused: No    Past Surgical History:  Procedure Laterality Date   20 HOUR PH STUDY N/A 11/29/2015   Procedure: 24 HOUR PH STUDY;  Surgeon: Ruffin Frederick, MD;  Location: [facility];  Service: Gastroenterology;  Laterality: N/A;   ABDOMINAL HYSTERECTOMY  12/04/2000   also bilateral salpingo-oophorectomy   CARDIAC CATHETERIZATION  2006   CARPAL TUNNEL RELEASE  11/20/2011   Procedure: CARPAL TUNNEL RELEASE;  Surgeon: Mariam Dollar, MD;  Location: [facility];  Service: Neurosurgery;  Laterality: Left;  LEFT carpal tunnel release   CERVICAL SPINE SURGERY  2013   [facility]   CHOLECYSTECTOMY  1997   COLONOSCOPY WITH PROPOFOL N/A 07/27/2020   Procedure: COLONOSCOPY WITH PROPOFOL;  Surgeon: Midge Minium, MD;  Location: [facility] ENDOSCOPY;  Service: Endoscopy;  Laterality: N/A;   DILATION AND CURETTAGE OF UTERUS     11/15/2000   ENDOSCOPIC RETROGRADE CHOLANGIOPANCREATOGRAPHY (ERCP) WITH PROPOFOL N/A 09/14/2022   Procedure: ENDOSCOPIC RETROGRADE CHOLANGIOPANCREATOGRAPHY (ERCP) WITH PROPOFOL;  Surgeon: Meridee Score Netty Starring., MD;  Location: [facility];  Service: Gastroenterology;  Laterality: N/A;   ESOPHAGEAL MANOMETRY N/A 09/20/2015   Procedure: ESOPHAGEAL MANOMETRY (EM);  Surgeon: Ruffin Frederick, MD;  Location: [facility];  Service: Gastroenterology;  Laterality: N/A;   ESOPHAGOGASTRODUODENOSCOPY (EGD) WITH PROPOFOL N/A 05/17/2021   Procedure: ESOPHAGOGASTRODUODENOSCOPY (EGD) WITH PROPOFOL;  Surgeon: Pasty Spillers, MD;  Location: [facility] ENDOSCOPY;  Service: Endoscopy;  Laterality: N/A;   ESOPHAGOGASTRODUODENOSCOPY (EGD) WITH PROPOFOL N/A 07/17/2022   Procedure: ESOPHAGOGASTRODUODENOSCOPY (EGD) WITH PROPOFOL;  Surgeon: Wyline Mood, MD;  Location: [facility] ENDOSCOPY;  Service: Gastroenterology;  Laterality: N/A;   ESOPHAGOGASTRODUODENOSCOPY (EGD) WITH PROPOFOL N/A 09/14/2022   Procedure: ESOPHAGOGASTRODUODENOSCOPY (EGD) WITH PROPOFOL;  Surgeon: Meridee Score Netty Starring., MD;  Location: [facility];  Service: Gastroenterology;  Laterality: N/A;   EUS N/A 09/14/2022   Procedure: UPPER ENDOSCOPIC ULTRASOUND (EUS) RADIAL;  Surgeon: Lemar Lofty., MD;  Location: [facility];  Service: Gastroenterology;  Laterality:  N/A;   HYSTEROSCOPY  11/15/2000   D & C, resection of endometrial polyps   LUMBAR SPINAL CORD SIMULATOR LEAD REMOVAL N/A 08/01/2021   Procedure: REMOVAL OF SPINAL CORD STIMULATOR;  Surgeon: Venetia Night, MD;  Location: [facility] ORS;  Service: Neurosurgery;  Laterality: N/A;   OOPHORECTOMY  12/04/2000   bilateral salpingo-oophorectomy done with TAH   REMOVAL OF STONES  09/14/2022   Procedure: REMOVAL OF STONES;  Surgeon: Meridee Score Netty Starring., MD;  Location: [facility];  Service: Gastroenterology;;   Dennison Mascot  09/14/2022   Procedure: Dennison Mascot;  Surgeon: Mansouraty, Netty Starring., MD;  Location: [facility];  Service: Gastroenterology;;   SPINAL CORD STIMULATOR INSERTION Bilateral 04/12/2020   Procedure: INSERTION CERVICAL SPINAL STIMULATOR PULSE GENERATOR;  Surgeon: Lucy Chris, MD;  Location: [facility];  Service: Neurosurgery;  Laterality: Bilateral;   SPINAL CORD STIMULATOR TRIAL N/A 03/22/2020   Procedure: CERVICAL SPINAL CORD STIMULATOR TRIAL PERCUTANEOUS;  Surgeon: Lucy Chris, MD;  Location: [facility] ORS;  Service: Neurosurgery;  Laterality: N/A;   TUBAL LIGATION      Family History  Problem Relation Age of Onset   Hypertension Father    Prostate cancer Father    Hypertension Sister    Lung cancer Sister    Diabetes Brother    Heart disease Brother    Hypertension Brother    Diabetes Brother    Heart disease Brother    Breast cancer Neg Hx     Allergies  Allergen Reactions   Alpha-Gal Anaphylaxis, Hives and Other (See Comments)    Alpha-gal Syndrome (AGS)   Milk (Cow) Anaphylaxis    Alpha-gal Syndrome (AGS)   Milk-Related Compounds Anaphylaxis and Other (See Comments)    Alpha-gal Syndrome (AGS)   Other Anaphylaxis and Other (See Comments)    NO ANIMAL-DERIVED PRODUCTS!! Alpha-gal Syndrome (AGS)   Bactrim [Sulfamethoxazole-Trimethoprim] Other (See Comments)    Mouth ulcers and sores    Current Outpatient Medications on File Prior to Visit  Medication Sig Dispense Refill   acetaminophen (TYLENOL) 325 MG tablet Take 325 mg by mouth 2 (two) times daily as needed (pain or headaches).     amLODipine (NORVASC) 5 MG tablet Take 5 mg by mouth daily.     atorvastatin (LIPITOR) 10 MG tablet Take 1 tablet (10 mg total) by mouth daily. for cholesterol. 90 tablet 1   azelastine (ASTELIN) 0.1 % nasal spray Place 1 spray into both nostrils 2 (two) times daily. Use in each nostril as directed 30 mL 5   CALCIUM-VITAMIN D PO Take 2 tablets by mouth daily.     cetirizine (ZYRTEC ALLERGY) 10 MG tablet Take 1 tablet (10 mg total) by mouth daily. 30 tablet 5   cromolyn (GASTROCROM) 100 MG/5ML solution Take 200 mg by mouth 4 (four) times daily -  before meals and at bedtime.     cyclobenzaprine (FLEXERIL) 5 MG tablet Take 1 tablet (5 mg total) by mouth at bedtime as needed for muscle spasms. 15 tablet 0   dicyclomine (BENTYL) 10 MG capsule Take 1 capsule (10 mg total) by mouth 3 (three) times daily before meals. 90 capsule 5   EPINEPHrine 0.3 mg/0.3 mL IJ SOAJ  injection Inject 0.3 mg into the muscle as directed. 2 each 1   fluticasone (FLONASE) 50 MCG/ACT nasal spray USE 2 SPRAYS IN EACH NOSTRIL EVERY DAY AS NEEDED FOR ALLERGIES OR RHINITIS 48 g 2   mupirocin ointment (BACTROBAN) 2 % Apply 1 Application topically 2 (two) times daily. 22 g 0  nitrofurantoin, macrocrystal-monohydrate, (MACROBID) 100 MG capsule Take 1 capsule (100 mg total) by mouth 2 (two) times daily. 14 capsule 0   omeprazole (PRILOSEC) 40 MG capsule TAKE 1 CAPSULE TWICE DAILY FOR HEARTBURN (Patient taking differently: Take 40 mg by mouth 2 (two) times daily before a meal.) 180 capsule 0   ondansetron (ZOFRAN-ODT) 8 MG disintegrating tablet Take 8 mg by mouth every 12 (twelve) hours as needed.     sucralfate (CARAFATE) 1 g tablet TAKE 1 TABLET THREE TIMES DAILY BEFORE MEALS 270 tablet 3   topiramate (TOPAMAX) 50 MG tablet Take 1 tablet (50 mg total) by mouth daily. For headache prevention 90 tablet 2   triamcinolone (KENALOG) 0.1 % paste Use as directed 1 Application in the mouth or throat 2 (two) times daily. 5 g 0   valACYclovir (VALTREX) 1000 MG tablet Take 2 tablets twice daily for 1 day. 4 tablet 0   valsartan (DIOVAN) 160 MG tablet Take 160 mg by mouth daily.     No current facility-administered medications on file prior to visit.    BP 134/88   Pulse 63   Temp (!) 97.2 F (36.2 C) (Temporal)   Ht 5\' 5"  (1.651 m)   Wt 122 lb (55.3 kg)   SpO2 99%   BMI 20.30 kg/m  Objective:   Physical Exam Constitutional:      General: She is not in acute distress. Pulmonary:     Effort: Pulmonary effort is normal.  Abdominal:     General: There is no distension.     Tenderness: There is generalized abdominal tenderness.     Comments:    Genitourinary:    Rectum: Normal.     Comments: No fecal impaction on exam. Skin:    General: Skin is warm and dry.  Neurological:     Mental Status: She is alert.           Assessment & Plan:  Chronic constipation Assessment &  Plan: Deteriorated with no bowel movement in 3 weeks. Fortunately, she had a small movement yesterday.  Rectal exam today without impaction. Great bowel sounds noted throughout.  Checking plain films of the abdomen today.  Increase MiraLAX to twice daily dosing, continue Senokot, Isbrella 50 mg twice daily. Recommended to try another enema tomorrow if no bowel movement.  Follow-up with GI early next week as scheduled.  Orders: -     DG Abd 2 Views        Doreene Nest, NP

## 2023-07-02 ENCOUNTER — Telehealth: Payer: Self-pay | Admitting: Primary Care

## 2023-07-02 ENCOUNTER — Other Ambulatory Visit: Payer: Self-pay | Admitting: Physician Assistant

## 2023-07-02 DIAGNOSIS — R1084 Generalized abdominal pain: Secondary | ICD-10-CM

## 2023-07-02 DIAGNOSIS — R918 Other nonspecific abnormal finding of lung field: Secondary | ICD-10-CM

## 2023-07-02 NOTE — Telephone Encounter (Signed)
Please call patient:  She is due for a CT chest to re-evaluate for lung nodules that were seen on her CT scan from February 2024. Is she willing to have a repeat CT scan of her chest to re-evaluate the nodules?  Which location? [place] or [place]?

## 2023-07-02 NOTE — Progress Notes (Signed)
 Valerie Console, PA-C [address]  [address]  [place], [place] [address]  Main: [phone]  Fax: [phone]   Primary Care Physician: Valerie Comer POUR, NP  Primary Gastroenterologist:  Valerie Console, PA-C / Dr. Ruel [name]    CC: Follow-up IBS and constipation, GERD  HPI: Valerie [name] is a 73 y.o. female returns for 45-month follow-up of IBS with chronic constipation and GERD.  Currently taking dicyclomine  10 Mg 3 times daily as needed, omeprazole  40 Mg once daily, sucralfate  1 g 3 times daily, and Zofran  8 Mg as needed.  Takes MiraLAX 1 capful daily and Senokot 2 tablets daily. Previously failed MiraLAX, lactulose, Linzess  and Trulance.    Was given Ibsrela  6 months ago which was cost prohibitive.  GERD is controlled on current treatment.  She continues to have moderate constipation and generalized abdominal pain.  Abdominal x-ray 06/29/2023 showed large volume of stool throughout the colon consistent with constipation.  No obstruction.  History of GERD with esophagitis since 2005.  Has alpha gal and milk allergy .  Cholecystectomy in 1997.     Extensive Negative GI Workup:  -Gastric emptying study 11/17/2022: Normal. -EGD 06/2022 by Dr. Kung was Normal.  Gastric biopsies negative for H. pylori.  -Abdominal MRI / MRCP 08/2022 showed sludge in the bile duct post cholecystectomy.   -ERCP & EUS by Dr. Wilhelmenia 09/14/2022 showed 1 cm hiatal hernia, mild gastritis, and sludge / debri removed from biliary and pancreatic duct..  Normal pancreas.  -CT abdomen pelvis with contrast 09/15/2022 showed mild hyperdensity in the distal common bile duct, likely reflecting sludge post recent ERCP.  Otherwise no acute abnormality.  No masses.  No bowel inflammation.  Previous hysterectomy, appendectomy, and cholecystectomy.  -Labs 11/28/2022 showed normal CBC, CMP, and lipase.  Normal LFTs. -Colonoscopy 07/2020 normal.  Excellent prep.  Small internal hemorrhoids.  Repeat in 10 years. -24 hour pH  manometry test in 2017 on PPI which was normal, suggesting functional heartburn - no pathologic acid reflux.   Current Outpatient Medications  Medication Sig Dispense Refill   acetaminophen  (TYLENOL ) 325 MG tablet Take 325 mg by mouth 2 (two) times daily as needed (pain or headaches).     amLODipine  (NORVASC ) 5 MG tablet Take 5 mg by mouth daily.     atorvastatin  (LIPITOR) 10 MG tablet Take 1 tablet (10 mg total) by mouth daily. for cholesterol. 90 tablet 1   azelastine  (ASTELIN ) 0.1 % nasal spray Place 1 spray into both nostrils 2 (two) times daily. Use in each nostril as directed 30 mL 5   CALCIUM -VITAMIN D  PO Take 2 tablets by mouth daily.     cetirizine  (ZYRTEC  ALLERGY ) 10 MG tablet Take 1 tablet (10 mg total) by mouth daily. 30 tablet 5   cromolyn (GASTROCROM) 100 MG/5ML solution Take 200 mg by mouth 4 (four) times daily -  before meals and at bedtime.     cyclobenzaprine  (FLEXERIL ) 5 MG tablet Take 1 tablet (5 mg total) by mouth at bedtime as needed for muscle spasms. 15 tablet 0   dicyclomine  (BENTYL ) 10 MG capsule TAKE 1 CAPSULE THREE TIMES DAILY BEFORE MEALS 270 capsule 3   EPINEPHrine  0.3 mg/0.3 mL IJ SOAJ injection Inject 0.3 mg into the muscle as directed. 2 each 1   fluticasone  (FLONASE ) 50 MCG/ACT nasal spray USE 2 SPRAYS IN EACH NOSTRIL EVERY DAY AS NEEDED FOR ALLERGIES OR RHINITIS 48 g 2   lubiprostone  (AMITIZA ) 24 MCG capsule Take 1 capsule (24 mcg total)  by mouth 2 (two) times daily with a meal. 60 capsule 11   mupirocin  ointment (BACTROBAN ) 2 % Apply 1 Application topically 2 (two) times daily. 22 g 0   nitrofurantoin , macrocrystal-monohydrate, (MACROBID ) 100 MG capsule Take 1 capsule (100 mg total) by mouth 2 (two) times daily. 14 capsule 0   omeprazole  (PRILOSEC) 40 MG capsule TAKE 1 CAPSULE TWICE DAILY FOR HEARTBURN (Patient taking differently: Take 40 mg by mouth 2 (two) times daily before a meal.) 180 capsule 0   ondansetron  (ZOFRAN -ODT) 8 MG disintegrating tablet Take 8 mg  by mouth every 12 (twelve) hours as needed.     polyethylene glycol-electrolytes (NULYTELY) 420 g solution Take 4,000 mLs by mouth once for 1 dose. 4000 mL 0   sucralfate  (CARAFATE ) 1 g tablet TAKE 1 TABLET THREE TIMES DAILY BEFORE MEALS 270 tablet 3   topiramate  (TOPAMAX ) 50 MG tablet Take 1 tablet (50 mg total) by mouth daily. For headache prevention 90 tablet 2   triamcinolone  (KENALOG ) 0.1 % paste Use as directed 1 Application in the mouth or throat 2 (two) times daily. 5 g 0   valACYclovir  (VALTREX ) 1000 MG tablet Take 2 tablets twice daily for 1 day. 4 tablet 0   valsartan  (DIOVAN ) 160 MG tablet Take 160 mg by mouth daily.     No current facility-administered medications for this visit.    Allergies as of 07/03/2023 - Review Complete 07/03/2023  Allergen Reaction Noted   Alpha-gal Anaphylaxis, Hives, and Other (See Comments) 09/15/2022   Milk (cow) Anaphylaxis 09/15/2022   Milk-related compounds Anaphylaxis and Other (See Comments) 09/15/2022   Other Anaphylaxis and Other (See Comments) 09/15/2022   Bactrim  [sulfamethoxazole -trimethoprim ] Other (See Comments) 03/22/2022    Past Medical History:  Diagnosis Date   Acid reflux    Acute cystitis with hematuria 12/08/2020   Adenocarcinoma of the endometrium/uterus (HCC) 12/04/2000   Anal fissure    Anxiety    Atrophic vaginitis    COVID-19 virus infection 09/28/2020   Dehydration    Difficult intubation    limited neck mobility   Dysuria 08/13/2020   Esophagitis    Hernia    Present to proximal to umbillical    History of hiatal hernia    Hyperlipidemia    Hypertension    Hypokalemia 09/15/2022   Internal hemorrhoids    Intractable abdominal pain 09/15/2022   Lactic acidosis    Menorrhagia    Osteopenia    Partial small bowel obstruction (HCC) 01/26/2021   PONV (postoperative nausea and vomiting)    Recurrent UTI    Vertigo    Vitamin D  deficiency     Past Surgical History:  Procedure Laterality Date   75 HOUR  PH STUDY N/A 11/29/2015   Procedure: 24 HOUR PH STUDY;  Surgeon: Valerie Deward Naval, MD;  Location: [facility];  Service: Gastroenterology;  Laterality: N/A;   ABDOMINAL HYSTERECTOMY  12/04/2000   also bilateral salpingo-oophorectomy   CARDIAC CATHETERIZATION  2006   CARPAL TUNNEL RELEASE  11/20/2011   Procedure: CARPAL TUNNEL RELEASE;  Surgeon: Arley SHAUNNA Helling, MD;  Location: [facility];  Service: Neurosurgery;  Laterality: Left;  LEFT carpal tunnel release   CERVICAL SPINE SURGERY  2013   [facility]   CHOLECYSTECTOMY  1997   COLONOSCOPY WITH PROPOFOL  N/A 07/27/2020   Procedure: COLONOSCOPY WITH PROPOFOL ;  Surgeon: Valerie Carmine, MD;  Location: [facility];  Service: Endoscopy;  Laterality: N/A;   DILATION AND CURETTAGE OF UTERUS     11/15/2000   ENDOSCOPIC  RETROGRADE CHOLANGIOPANCREATOGRAPHY (ERCP) WITH PROPOFOL  N/A 09/14/2022   Procedure: ENDOSCOPIC RETROGRADE CHOLANGIOPANCREATOGRAPHY (ERCP) WITH PROPOFOL ;  Surgeon: Valerie Valerie Raddle., MD;  Location: [facility];  Service: Gastroenterology;  Laterality: N/A;   ESOPHAGEAL MANOMETRY N/A 09/20/2015   Procedure: ESOPHAGEAL MANOMETRY (EM);  Surgeon: Valerie Deward Naval, MD;  Location: [facility];  Service: Gastroenterology;  Laterality: N/A;   ESOPHAGOGASTRODUODENOSCOPY (EGD) WITH PROPOFOL  N/A 05/17/2021   Procedure: ESOPHAGOGASTRODUODENOSCOPY (EGD) WITH PROPOFOL ;  Surgeon: Janalyn Keene NOVAK, MD;  Location: [facility];  Service: Endoscopy;  Laterality: N/A;   ESOPHAGOGASTRODUODENOSCOPY (EGD) WITH PROPOFOL  N/A 07/17/2022   Procedure: ESOPHAGOGASTRODUODENOSCOPY (EGD) WITH PROPOFOL ;  Surgeon: Valerie Bi, MD;  Location: [facility] END[facility];  Service: Gastroenterology;  Laterality: N/A;   ESOPHAGOGASTRODUODENOSCOPY (EGD) WITH PROPOFOL  N/A 09/14/2022   Procedure: ESOPHAGOGASTRODUODENOSCOPY (EGD) WITH PROPOFOL ;  Surgeon: Valerie Valerie Raddle., MD;  Location: [facility];  Service: Gastroenterology;  Laterality: N/A;   EUS N/A 09/14/2022    Procedure: UPPER ENDOSCOPIC ULTRASOUND (EUS) RADIAL;  Surgeon: Valerie Valerie Raddle., MD;  Location: [facility];  Service: Gastroenterology;  Laterality: N/A;   HYSTEROSCOPY  11/15/2000   D & C, resection of endometrial polyps   LUMBAR SPINAL CORD SIMULATOR LEAD REMOVAL N/A 08/01/2021   Procedure: REMOVAL OF SPINAL CORD STIMULATOR;  Surgeon: Valerie Fret, MD;  Location: [facility];  Service: Neurosurgery;  Laterality: N/A;   OOPHORECTOMY  12/04/2000   bilateral salpingo-oophorectomy done with TAH   REMOVAL OF STONES  09/14/2022   Procedure: REMOVAL OF STONES;  Surgeon: Valerie Valerie Raddle., MD;  Location: [facility];  Service: Gastroenterology;;   ANNETT  09/14/2022   Procedure: ANNETT;  Surgeon: Valerie [name], Valerie Raddle., MD;  Location: [facility];  Service: Gastroenterology;;   SPINAL CORD STIMULATOR INSERTION Bilateral 04/12/2020   Procedure: INSERTION CERVICAL SPINAL STIMULATOR PULSE GENERATOR;  Surgeon: Valerie Elspeth, MD;  Location: [facility];  Service: Neurosurgery;  Laterality: Bilateral;   SPINAL CORD STIMULATOR TRIAL N/A 03/22/2020   Procedure: CERVICAL SPINAL CORD STIMULATOR TRIAL PERCUTANEOUS;  Surgeon: Valerie Elspeth, MD;  Location: [facility];  Service: Neurosurgery;  Laterality: N/A;   TUBAL LIGATION      Review of Systems:    All systems reviewed and negative except where noted in HPI.   Physical Examination:   BP 134/73   Pulse 65   Temp 98 F (36.7 C)   Ht 5' 5 (1.651 m)   Wt 120 lb 6.4 oz (54.6 kg)   BMI 20.04 kg/m   General: Well-nourished, well-developed in no acute distress.  Lungs: Clear to auscultation bilaterally. Non-labored. Heart: Regular rate and rhythm, no murmurs rubs or gallops.  Abdomen: Bowel sounds are normal; Abdomen is Soft; No hepatosplenomegaly, masses or hernias;  Mild Generalized diffuse upper and lower bilateraly Abdominal Tenderness; No guarding or rebound tenderness. Neuro: Alert and oriented x 3.  Grossly intact.   Psych: Alert and cooperative, normal mood and affect.   Imaging Studies: DG Abd 2 Views Result Date: 06/29/2023 CLINICAL DATA:  Constipation EXAM: ABDOMEN - 2 VIEW COMPARISON:  01/03/2022 FINDINGS: The bowel gas pattern is normal. Cholecystectomy clips. Large volume stool throughout the colon. There is no evidence of free air. No radio-opaque calculi or other significant radiographic abnormality is seen. IMPRESSION: Large volume stool throughout the colon. No evidence of bowel obstruction. Electronically Signed   By: Valerie [name] [place] D.O.   On: 06/29/2023 16:36    Assessment and Plan:   LYRICA MCCLARTY is a 73 y.o. y/o female returns for follow-up of:   1.  Irritable Bowel  Syndrome, Constipation predominant  Rx: Amitiza  24mcg 1 capsule twice daily with food.  2.  Chronic Idiopathic Constipation  Rx: Golytely Colon Purge.  3.  GERD - Controlled on current treament.  Continue current treatmnet.   Valerie Console, PA-C  Follow up 4 weeks with TG.

## 2023-07-03 ENCOUNTER — Encounter: Payer: Self-pay | Admitting: Physician Assistant

## 2023-07-03 ENCOUNTER — Ambulatory Visit: Payer: Medicare HMO | Admitting: Physician Assistant

## 2023-07-03 VITALS — BP 134/73 | HR 65 | Temp 98.0°F | Ht 65.0 in | Wt 120.4 lb

## 2023-07-03 DIAGNOSIS — K219 Gastro-esophageal reflux disease without esophagitis: Secondary | ICD-10-CM | POA: Diagnosis not present

## 2023-07-03 DIAGNOSIS — K5904 Chronic idiopathic constipation: Secondary | ICD-10-CM | POA: Diagnosis not present

## 2023-07-03 DIAGNOSIS — K581 Irritable bowel syndrome with constipation: Secondary | ICD-10-CM | POA: Diagnosis not present

## 2023-07-03 MED ORDER — PEG 3350-KCL-NA BICARB-NACL 420 G PO SOLR
4000.0000 mL | Freq: Once | ORAL | 0 refills | Status: AC
Start: 2023-07-03 — End: 2023-07-03

## 2023-07-03 MED ORDER — LUBIPROSTONE 24 MCG PO CAPS
24.0000 ug | ORAL_CAPSULE | Freq: Two times a day (BID) | ORAL | 11 refills | Status: DC
Start: 2023-07-03 — End: 2023-07-31

## 2023-07-03 NOTE — Telephone Encounter (Signed)
Noted.  CT chest ordered and pending.

## 2023-07-03 NOTE — Telephone Encounter (Signed)
Called and spoke with patient, she is agreeable to having repeat CT scan done to reevaluate lung nodules. Does not have a preference on location.

## 2023-07-04 ENCOUNTER — Encounter: Payer: Self-pay | Admitting: *Deleted

## 2023-07-13 ENCOUNTER — Encounter: Payer: Self-pay | Admitting: Primary Care

## 2023-07-13 ENCOUNTER — Ambulatory Visit (INDEPENDENT_AMBULATORY_CARE_PROVIDER_SITE_OTHER): Payer: Medicare HMO | Admitting: Primary Care

## 2023-07-13 VITALS — BP 132/84 | HR 88 | Temp 97.8°F | Ht 65.0 in | Wt 119.0 lb

## 2023-07-13 DIAGNOSIS — H6120 Impacted cerumen, unspecified ear: Secondary | ICD-10-CM | POA: Insufficient documentation

## 2023-07-13 DIAGNOSIS — H6121 Impacted cerumen, right ear: Secondary | ICD-10-CM | POA: Diagnosis not present

## 2023-07-13 DIAGNOSIS — R051 Acute cough: Secondary | ICD-10-CM | POA: Insufficient documentation

## 2023-07-13 LAB — POCT INFLUENZA A/B
Influenza A, POC: NEGATIVE
Influenza B, POC: NEGATIVE

## 2023-07-13 LAB — POC COVID19 BINAXNOW: SARS Coronavirus 2 Ag: NEGATIVE

## 2023-07-13 NOTE — Patient Instructions (Signed)
You can try a few things over the counter to help with your symptoms including:  Cough: Delsym or Robitussin (get the off brand, works just as well) Chest Congestion: Mucinex (plain) Nasal Congestion/Ear Pressure/Sinus Pressure: Try using Flonase (fluticasone) nasal spray. Instill 1 spray in each nostril twice daily. This can be purchased over the counter. Body aches, fevers, headache: Ibuprofen (not to exceed 2400 mg in 24 hours) or Acetaminophen-Tylenol (not to exceed 3000 mg in 24 hours) Runny Nose/Throat Drainage/Sneezing/Itchy or Watery Eyes: An antihistamine such as Zyrtec, Claritin, Xyzal, Allegra  You should be feeling better by day seven of symptoms, but please do contact me if this is not the case.  It was a pleasure to see you today!

## 2023-07-13 NOTE — Assessment & Plan Note (Signed)
Symptoms and exam today consistent for viral etiology at this point.  Negative rapid flu and Covid-19 tests today.  Discussed conservative treatment with OTC options. Follow up PRN.

## 2023-07-13 NOTE — Progress Notes (Signed)
Subjective:    Patient ID: Valerie [name], female    DOB: November 11, 1950, 73 y.o.   MRN: [id number]  HPI  KEMPER HOCHMAN is a very pleasant 73 y.o. female with a history of hypertension, GERD, prediabetes, hyperlipidemia, tinnitus of ears, headaches, chronic nausea, chronic abdominal pain who presents today to discuss URI symptoms.  Symptom onset two days ago with dry cough. She then developed right ear pain, nasal congestion, body aches.   She denies fevers, chills, post nasal drip, sick contacts, chest tightness, shortness of breath. She's been taking an allergy pill for 2 days without improvement.      Review of Systems  Constitutional:  Negative for chills and fever.  HENT:  Positive for congestion and ear pain. Negative for postnasal drip and sore throat.   Respiratory:  Positive for cough. Negative for chest tightness and shortness of breath.          Past Medical History:  Diagnosis Date   Acid reflux    Acute cystitis with hematuria 12/08/2020   Adenocarcinoma of the endometrium/uterus (HCC) 12/04/2000   Anal fissure    Anxiety    Atrophic vaginitis    COVID-19 virus infection 09/28/2020   Dehydration    Difficult intubation    limited neck mobility   Dysuria 08/13/2020   Esophagitis    Hernia    Present to proximal to umbillical    History of hiatal hernia    Hyperlipidemia    Hypertension    Hypokalemia 09/15/2022   Internal hemorrhoids    Intractable abdominal pain 09/15/2022   Lactic acidosis    Menorrhagia    Osteopenia    Partial small bowel obstruction (HCC) 01/26/2021   PONV (postoperative nausea and vomiting)    Recurrent UTI    Vertigo    Vitamin D deficiency     Social History   Socioeconomic History   Marital status: Married    Spouse name: Not on file   Number of children: Not on file   Years of education: Not on file   Highest education level: Not on file  Occupational History   Not on file  Tobacco Use   Smoking status: Never     Passive exposure: Never   Smokeless tobacco: Never  Vaping Use   Vaping status: Never Used  Substance and Sexual Activity   Alcohol use: No   Drug use: Never   Sexual activity: Yes    Birth control/protection: Post-menopausal, Surgical  Other Topics Concern   Not on file  Social History Narrative   Married   Lives in [place]   Has 2 children, 4 grandchildren   Enjoys walking, shopping.   Social Drivers of Corporate investment banker Strain: Low Risk  (09/12/2022)   Overall Financial Resource Strain (CARDIA)    Difficulty of Paying Living Expenses: Not hard at all  Food Insecurity: No Food Insecurity (09/18/2022)   Hunger Vital Sign    Worried About Running Out of Food in the Last Year: Never true    Ran Out of Food in the Last Year: Never true  Transportation Needs: No Transportation Needs (09/18/2022)   PRAPARE - Administrator, Civil Service (Medical): No    Lack of Transportation (Non-Medical): No  Physical Activity: Inactive (09/12/2022)   Exercise Vital Sign    Days of Exercise per Week: 0 days    Minutes of Exercise per Session: 0 min  Stress: No Stress Concern Present (09/12/2022)   [place]  Institute of Occupational Health - Occupational Stress Questionnaire    Feeling of Stress : Not at all  Social Connections: Moderately Integrated (09/12/2022)   Social Connection and Isolation Panel [NHANES]    Frequency of Communication with Friends and Family: More than three times a week    Frequency of Social Gatherings with Friends and Family: More than three times a week    Attends Religious Services: More than 4 times per year    Active Member of [employer] or Organizations: No    Attends Banker Meetings: Never    Marital Status: Married  Catering manager Violence: Not At Risk (09/15/2022)   Humiliation, Afraid, Rape, and Kick questionnaire    Fear of Current or Ex-Partner: No    Emotionally Abused: No    Physically Abused: No    Sexually Abused: No     Past Surgical History:  Procedure Laterality Date   43 HOUR PH STUDY N/A 11/29/2015   Procedure: 24 HOUR PH STUDY;  Surgeon: Ruffin Frederick, MD;  Location: [facility];  Service: Gastroenterology;  Laterality: N/A;   ABDOMINAL HYSTERECTOMY  12/04/2000   also bilateral salpingo-oophorectomy   CARDIAC CATHETERIZATION  2006   CARPAL TUNNEL RELEASE  11/20/2011   Procedure: CARPAL TUNNEL RELEASE;  Surgeon: Mariam Dollar, MD;  Location: [facility];  Service: Neurosurgery;  Laterality: Left;  LEFT carpal tunnel release   CERVICAL SPINE SURGERY  2013   [facility]   CHOLECYSTECTOMY  1997   COLONOSCOPY WITH PROPOFOL N/A 07/27/2020   Procedure: COLONOSCOPY WITH PROPOFOL;  Surgeon: Midge Minium, MD;  Location: [facility] ENDOSCOPY;  Service: Endoscopy;  Laterality: N/A;   DILATION AND CURETTAGE OF UTERUS     11/15/2000   ENDOSCOPIC RETROGRADE CHOLANGIOPANCREATOGRAPHY (ERCP) WITH PROPOFOL N/A 09/14/2022   Procedure: ENDOSCOPIC RETROGRADE CHOLANGIOPANCREATOGRAPHY (ERCP) WITH PROPOFOL;  Surgeon: Meridee Score Netty Starring., MD;  Location: [facility];  Service: Gastroenterology;  Laterality: N/A;   ESOPHAGEAL MANOMETRY N/A 09/20/2015   Procedure: ESOPHAGEAL MANOMETRY (EM);  Surgeon: Ruffin Frederick, MD;  Location: [facility];  Service: Gastroenterology;  Laterality: N/A;   ESOPHAGOGASTRODUODENOSCOPY (EGD) WITH PROPOFOL N/A 05/17/2021   Procedure: ESOPHAGOGASTRODUODENOSCOPY (EGD) WITH PROPOFOL;  Surgeon: Pasty Spillers, MD;  Location: [facility] ENDOSCOPY;  Service: Endoscopy;  Laterality: N/A;   ESOPHAGOGASTRODUODENOSCOPY (EGD) WITH PROPOFOL N/A 07/17/2022   Procedure: ESOPHAGOGASTRODUODENOSCOPY (EGD) WITH PROPOFOL;  Surgeon: Wyline Mood, MD;  Location: [facility] ENDOSCOPY;  Service: Gastroenterology;  Laterality: N/A;   ESOPHAGOGASTRODUODENOSCOPY (EGD) WITH PROPOFOL N/A 09/14/2022   Procedure: ESOPHAGOGASTRODUODENOSCOPY (EGD) WITH PROPOFOL;  Surgeon: Meridee Score Netty Starring., MD;  Location: [facility];   Service: Gastroenterology;  Laterality: N/A;   EUS N/A 09/14/2022   Procedure: UPPER ENDOSCOPIC ULTRASOUND (EUS) RADIAL;  Surgeon: Lemar Lofty., MD;  Location: [facility] ENDOSCOPY;  Service: Gastroenterology;  Laterality: N/A;   HYSTEROSCOPY  11/15/2000   D & C, resection of endometrial polyps   LUMBAR SPINAL CORD SIMULATOR LEAD REMOVAL N/A 08/01/2021   Procedure: REMOVAL OF SPINAL CORD STIMULATOR;  Surgeon: Venetia Night, MD;  Location: [facility] ORS;  Service: Neurosurgery;  Laterality: N/A;   OOPHORECTOMY  12/04/2000   bilateral salpingo-oophorectomy done with TAH   REMOVAL OF STONES  09/14/2022   Procedure: REMOVAL OF STONES;  Surgeon: Meridee Score Netty Starring., MD;  Location: [facility] ENDOSCOPY;  Service: Gastroenterology;;   Dennison Mascot  09/14/2022   Procedure: Dennison Mascot;  Surgeon: Meridee Score Netty Starring., MD;  Location: [facility] ENDOSCOPY;  Service: Gastroenterology;;   SPINAL CORD STIMULATOR INSERTION Bilateral 04/12/2020   Procedure: INSERTION CERVICAL SPINAL  STIMULATOR PULSE GENERATOR;  Surgeon: Lucy Chris, MD;  Location: [facility] ORS;  Service: Neurosurgery;  Laterality: Bilateral;   SPINAL CORD STIMULATOR TRIAL N/A 03/22/2020   Procedure: CERVICAL SPINAL CORD STIMULATOR TRIAL PERCUTANEOUS;  Surgeon: Lucy Chris, MD;  Location: [facility] ORS;  Service: Neurosurgery;  Laterality: N/A;   TUBAL LIGATION      Family History  Problem Relation Age of Onset   Hypertension Father    Prostate cancer Father    Hypertension Sister    Lung cancer Sister    Diabetes Brother    Heart disease Brother    Hypertension Brother    Diabetes Brother    Heart disease Brother    Breast cancer Neg Hx     Allergies  Allergen Reactions   Alpha-Gal Anaphylaxis, Hives and Other (See Comments)    Alpha-gal Syndrome (AGS)   Milk (Cow) Anaphylaxis    Alpha-gal Syndrome (AGS)   Milk-Related Compounds Anaphylaxis and Other (See Comments)    Alpha-gal Syndrome (AGS)   Other Anaphylaxis and Other (See Comments)     NO ANIMAL-DERIVED PRODUCTS!! Alpha-gal Syndrome (AGS)   Bactrim [Sulfamethoxazole-Trimethoprim] Other (See Comments)    Mouth ulcers and sores    Current Outpatient Medications on File Prior to Visit  Medication Sig Dispense Refill   acetaminophen (TYLENOL) 325 MG tablet Take 325 mg by mouth 2 (two) times daily as needed (pain or headaches).     amLODipine (NORVASC) 5 MG tablet Take 5 mg by mouth daily.     atorvastatin (LIPITOR) 10 MG tablet Take 1 tablet (10 mg total) by mouth daily. for cholesterol. 90 tablet 1   azelastine (ASTELIN) 0.1 % nasal spray Place 1 spray into both nostrils 2 (two) times daily. Use in each nostril as directed 30 mL 5   CALCIUM-VITAMIN D PO Take 2 tablets by mouth daily.     cetirizine (ZYRTEC ALLERGY) 10 MG tablet Take 1 tablet (10 mg total) by mouth daily. 30 tablet 5   cromolyn (GASTROCROM) 100 MG/5ML solution Take 200 mg by mouth 4 (four) times daily -  before meals and at bedtime.     cyclobenzaprine (FLEXERIL) 5 MG tablet Take 1 tablet (5 mg total) by mouth at bedtime as needed for muscle spasms. 15 tablet 0   dicyclomine (BENTYL) 10 MG capsule TAKE 1 CAPSULE THREE TIMES DAILY BEFORE MEALS 270 capsule 3   EPINEPHrine 0.3 mg/0.3 mL IJ SOAJ injection Inject 0.3 mg into the muscle as directed. 2 each 1   fluticasone (FLONASE) 50 MCG/ACT nasal spray USE 2 SPRAYS IN EACH NOSTRIL EVERY DAY AS NEEDED FOR ALLERGIES OR RHINITIS 48 g 2   lubiprostone (AMITIZA) 24 MCG capsule Take 1 capsule (24 mcg total) by mouth 2 (two) times daily with a meal. 60 capsule 11   mupirocin ointment (BACTROBAN) 2 % Apply 1 Application topically 2 (two) times daily. 22 g 0   nitrofurantoin, macrocrystal-monohydrate, (MACROBID) 100 MG capsule Take 1 capsule (100 mg total) by mouth 2 (two) times daily. 14 capsule 0   omeprazole (PRILOSEC) 40 MG capsule TAKE 1 CAPSULE TWICE DAILY FOR HEARTBURN (Patient taking differently: Take 40 mg by mouth 2 (two) times daily before a meal.) 180 capsule 0    ondansetron (ZOFRAN-ODT) 8 MG disintegrating tablet Take 8 mg by mouth every 12 (twelve) hours as needed.     sucralfate (CARAFATE) 1 g tablet TAKE 1 TABLET THREE TIMES DAILY BEFORE MEALS 270 tablet 3   topiramate (TOPAMAX) 50 MG tablet Take 1 tablet (50  mg total) by mouth daily. For headache prevention 90 tablet 2   triamcinolone (KENALOG) 0.1 % paste Use as directed 1 Application in the mouth or throat 2 (two) times daily. 5 g 0   valACYclovir (VALTREX) 1000 MG tablet Take 2 tablets twice daily for 1 day. 4 tablet 0   valsartan (DIOVAN) 160 MG tablet Take 160 mg by mouth daily.     No current facility-administered medications on file prior to visit.    BP 132/84   Pulse 88   Temp 97.8 F (36.6 C) (Temporal)   Ht 5\' 5"  (1.651 m)   Wt 119 lb (54 kg)   SpO2 98%   BMI 19.80 kg/m  Objective:   Physical Exam HENT:     Right Ear: There is impacted cerumen.  Cardiovascular:     Rate and Rhythm: Normal rate and regular rhythm.  Pulmonary:     Effort: Pulmonary effort is normal.     Breath sounds: Normal breath sounds.     Comments: Dry cough noted during visit Musculoskeletal:     Cervical back: Neck supple.  Skin:    General: Skin is warm and dry.  Neurological:     Mental Status: She is alert and oriented to person, place, and time.  Psychiatric:        Mood and Affect: Mood normal.           Assessment & Plan:  Acute cough Assessment & Plan: Symptoms and exam today consistent for viral etiology at this point.  Negative rapid flu and Covid-19 tests today.  Discussed conservative treatment with OTC options. Follow up PRN.  Orders: -     POC COVID-19 BinaxNow -     POCT Influenza A/B  Impacted cerumen of right ear Assessment & Plan: Right cerumen impaction identified on exam. Patient consented to irrigation of canal on the right.  Right canal irrigated. Patient tolerated well. TM's and canals post irrigation unremarkable.   Discussed home care  instructions.           Doreene Nest, NP

## 2023-07-13 NOTE — Assessment & Plan Note (Signed)
Right cerumen impaction identified on exam. Patient consented to irrigation of canal on the right.  Right canal irrigated. Patient tolerated well. TM's and canals post irrigation unremarkable.   Discussed home care instructions.

## 2023-07-30 ENCOUNTER — Telehealth: Payer: Self-pay | Admitting: Gastroenterology

## 2023-07-30 NOTE — Progress Notes (Unsigned)
 Celso Amy, PA-C [address]  [address]  [place], [place] [address]  Main: [phone]  Fax: [phone]   Primary Care Physician: Doreene Nest, NP  Primary Gastroenterologist:  Celso Amy, PA-C / Dr. Wyline Mood    CC: F/U IBS-C and GERD  HPI: Valerie [name] is a 73 y.o. female returns for follow-up of IBS with chronic constipation and chronic GERD.  Currently taking dicyclomine 10 Mg 3 times daily as needed, omeprazole 40 Mg once daily, sucralfate 1 g 3 times daily, and Zofran 8 Mg as needed.  1 month ago she was started on Amitiza 24 mcg twice daily.  On this treatment she is having 1 bowel movement per week.  She continues to have generalized abdominal pain in the epigastrium and bilateral lower abdomen.  Continues to have episodes of chronic nausea and vomiting.  She has had chronic intermittent GI symptoms for many years.  Tried multiple GI treatments over the years.  Linzess and Allena Napoleon were cost prohibitive.  Prev and Ibsrela.  Iously tried and failed MiraLAX, Senokot, Linzess, Trulance, and Ibsrela.   Abdominal x-ray 06/29/2023 showed large volume of stool throughout the colon consistent with constipation.  No obstruction.   History of GERD with esophagitis since 2005.  Has alpha gal and milk allergy.  Cholecystectomy in 1997.     Extensive Negative GI Workup:  -Gastric emptying study 11/17/2022: Normal. -EGD 06/2022 by Dr. Tobi Bastos was Normal.  Gastric biopsies negative for H. pylori.  -Abdominal MRI / MRCP 08/2022 showed sludge in the bile duct post cholecystectomy.   -ERCP & EUS by Dr. Meridee Score 09/14/2022 showed 1 cm hiatal hernia, mild gastritis, and sludge / debri removed from biliary and pancreatic duct..  Normal pancreas.  -CT abdomen pelvis with contrast 09/15/2022 showed mild hyperdensity in the distal common bile duct, likely reflecting sludge post recent ERCP.  Otherwise no acute abnormality.  No masses.  No bowel inflammation.  Previous hysterectomy,  appendectomy, and cholecystectomy.  -Labs 11/28/2022 showed normal CBC, CMP, and lipase.  Normal LFTs. -Colonoscopy 07/2020 normal.  Excellent prep.  Small internal hemorrhoids.  Repeat in 10 years. -24 hour pH manometry test in 2017 on PPI which was normal, suggesting functional heartburn - no pathologic acid reflux.   Current Outpatient Medications  Medication Sig Dispense Refill   acetaminophen (TYLENOL) 325 MG tablet Take 325 mg by mouth 2 (two) times daily as needed (pain or headaches).     amLODipine (NORVASC) 5 MG tablet Take 5 mg by mouth daily.     atorvastatin (LIPITOR) 10 MG tablet Take 1 tablet (10 mg total) by mouth daily. for cholesterol. 90 tablet 1   azelastine (ASTELIN) 0.1 % nasal spray Place 1 spray into both nostrils 2 (two) times daily. Use in each nostril as directed 30 mL 5   CALCIUM-VITAMIN D PO Take 2 tablets by mouth daily.     cetirizine (ZYRTEC ALLERGY) 10 MG tablet Take 1 tablet (10 mg total) by mouth daily. 30 tablet 5   cromolyn (GASTROCROM) 100 MG/5ML solution Take 200 mg by mouth 4 (four) times daily -  before meals and at bedtime.     cyclobenzaprine (FLEXERIL) 5 MG tablet Take 1 tablet (5 mg total) by mouth at bedtime as needed for muscle spasms. 15 tablet 0   dicyclomine (BENTYL) 10 MG capsule TAKE 1 CAPSULE THREE TIMES DAILY BEFORE MEALS 270 capsule 3   EPINEPHrine 0.3 mg/0.3 mL IJ SOAJ injection Inject 0.3 mg into the muscle  as directed. 2 each 1   fluticasone (FLONASE) 50 MCG/ACT nasal spray USE 2 SPRAYS IN EACH NOSTRIL EVERY DAY AS NEEDED FOR ALLERGIES OR RHINITIS 48 g 2   mupirocin ointment (BACTROBAN) 2 % Apply 1 Application topically 2 (two) times daily. 22 g 0   nitrofurantoin, macrocrystal-monohydrate, (MACROBID) 100 MG capsule Take 1 capsule (100 mg total) by mouth 2 (two) times daily. 14 capsule 0   topiramate (TOPAMAX) 50 MG tablet Take 1 tablet (50 mg total) by mouth daily. For headache prevention 90 tablet 2   triamcinolone (KENALOG) 0.1 % paste Use  as directed 1 Application in the mouth or throat 2 (two) times daily. 5 g 0   valACYclovir (VALTREX) 1000 MG tablet Take 2 tablets twice daily for 1 day. 4 tablet 0   valsartan (DIOVAN) 160 MG tablet Take 160 mg by mouth daily.     lubiprostone (AMITIZA) 24 MCG capsule Take 1 capsule (24 mcg total) by mouth 2 (two) times daily with a meal. 180 capsule 3   omeprazole (PRILOSEC) 40 MG capsule Take 1 capsule (40 mg total) by mouth 2 (two) times daily. TAKE 1 CAPSULE TWICE DAILY FOR HEARTBURN 180 capsule 3   ondansetron (ZOFRAN-ODT) 8 MG disintegrating tablet Take 1 tablet (8 mg total) by mouth 3 (three) times daily as needed. 90 tablet 11   sucralfate (CARAFATE) 1 g tablet Take 1 tablet (1 g total) by mouth 4 (four) times daily -  with meals and at bedtime. 120 tablet 11   No current facility-administered medications for this visit.    Allergies as of 07/31/2023 - Review Complete 07/31/2023  Allergen Reaction Noted   Alpha-gal Anaphylaxis, Hives, and Other (See Comments) 09/15/2022   Milk (cow) Anaphylaxis 09/15/2022   Milk-related compounds Anaphylaxis and Other (See Comments) 09/15/2022   Other Anaphylaxis and Other (See Comments) 09/15/2022   Bactrim [sulfamethoxazole-trimethoprim] Other (See Comments) 03/22/2022    Past Medical History:  Diagnosis Date   Acid reflux    Acute cystitis with hematuria 12/08/2020   Adenocarcinoma of the endometrium/uterus (HCC) 12/04/2000   Anal fissure    Anxiety    Atrophic vaginitis    COVID-19 virus infection 09/28/2020   Dehydration    Difficult intubation    limited neck mobility   Dysuria 08/13/2020   Esophagitis    Hernia    Present to proximal to umbillical    History of hiatal hernia    Hyperlipidemia    Hypertension    Hypokalemia 09/15/2022   Internal hemorrhoids    Intractable abdominal pain 09/15/2022   Lactic acidosis    Menorrhagia    Osteopenia    Partial small bowel obstruction (HCC) 01/26/2021   PONV (postoperative nausea  and vomiting)    Recurrent UTI    Vertigo    Vitamin D deficiency     Past Surgical History:  Procedure Laterality Date   99 HOUR PH STUDY N/A 11/29/2015   Procedure: 24 HOUR PH STUDY;  Surgeon: Ruffin Frederick, MD;  Location: [facility] ENDOSCOPY;  Service: Gastroenterology;  Laterality: N/A;   ABDOMINAL HYSTERECTOMY  12/04/2000   also bilateral salpingo-oophorectomy   CARDIAC CATHETERIZATION  2006   CARPAL TUNNEL RELEASE  11/20/2011   Procedure: CARPAL TUNNEL RELEASE;  Surgeon: Mariam Dollar, MD;  Location: [facility];  Service: Neurosurgery;  Laterality: Left;  LEFT carpal tunnel release   CERVICAL SPINE SURGERY  2013   [facility]   CHOLECYSTECTOMY  1997   COLONOSCOPY WITH PROPOFOL N/A 07/27/2020  Procedure: COLONOSCOPY WITH PROPOFOL;  Surgeon: Midge Minium, MD;  Location: [facility] ENDOSCOPY;  Service: Endoscopy;  Laterality: N/A;   DILATION AND CURETTAGE OF UTERUS     11/15/2000   ENDOSCOPIC RETROGRADE CHOLANGIOPANCREATOGRAPHY (ERCP) WITH PROPOFOL N/A 09/14/2022   Procedure: ENDOSCOPIC RETROGRADE CHOLANGIOPANCREATOGRAPHY (ERCP) WITH PROPOFOL;  Surgeon: Meridee Score Netty Starring., MD;  Location: [facility];  Service: Gastroenterology;  Laterality: N/A;   ESOPHAGEAL MANOMETRY N/A 09/20/2015   Procedure: ESOPHAGEAL MANOMETRY (EM);  Surgeon: Ruffin Frederick, MD;  Location: [facility] ENDOS[facility];  Service: Gastroenterology;  Laterality: N/A;   ESOPHAGOGASTRODUODENOSCOPY (EGD) WITH PROPOFOL N/A 05/17/2021   Procedure: ESOPHAGOGASTRODUODENOSCOPY (EGD) WITH PROPOFOL;  Surgeon: Pasty Spillers, MD;  Location: [facility] ENDOSCOPY;  Service: Endoscopy;  Laterality: N/A;   ESOPHAGOGASTRODUODENOSCOPY (EGD) WITH PROPOFOL N/A 07/17/2022   Procedure: ESOPHAGOGASTRODUODENOSCOPY (EGD) WITH PROPOFOL;  Surgeon: Wyline Mood, MD;  Location: [facility] ENDOSCOPY;  Service: Gastroenterology;  Laterality: N/A;   ESOPHAGOGASTRODUODENOSCOPY (EGD) WITH PROPOFOL N/A 09/14/2022   Procedure: ESOPHAGOGASTRODUODENOSCOPY (EGD) WITH PROPOFOL;   Surgeon: Meridee Score Netty Starring., MD;  Location: [facility];  Service: Gastroenterology;  Laterality: N/A;   EUS N/A 09/14/2022   Procedure: UPPER ENDOSCOPIC ULTRASOUND (EUS) RADIAL;  Surgeon: Lemar Lofty., MD;  Location: [facility] END[facility];  Service: Gastroenterology;  Laterality: N/A;   HYSTEROSCOPY  11/15/2000   D & C, resection of endometrial polyps   LUMBAR SPINAL CORD SIMULATOR LEAD REMOVAL N/A 08/01/2021   Procedure: REMOVAL OF SPINAL CORD STIMULATOR;  Surgeon: Venetia Night, MD;  Location: [facility] ORS;  Service: Neurosurgery;  Laterality: N/A;   OOPHORECTOMY  12/04/2000   bilateral salpingo-oophorectomy done with TAH   REMOVAL OF STONES  09/14/2022   Procedure: REMOVAL OF STONES;  Surgeon: Meridee Score Netty Starring., MD;  Location: [facility];  Service: Gastroenterology;;   Dennison Mascot  09/14/2022   Procedure: Dennison Mascot;  Surgeon: Mansouraty, Netty Starring., MD;  Location: [facility];  Service: Gastroenterology;;   SPINAL CORD STIMULATOR INSERTION Bilateral 04/12/2020   Procedure: INSERTION CERVICAL SPINAL STIMULATOR PULSE GENERATOR;  Surgeon: Lucy Chris, MD;  Location: [facility] ORS;  Service: Neurosurgery;  Laterality: Bilateral;   SPINAL CORD STIMULATOR TRIAL N/A 03/22/2020   Procedure: CERVICAL SPINAL CORD STIMULATOR TRIAL PERCUTANEOUS;  Surgeon: Lucy Chris, MD;  Location: [facility] ORS;  Service: Neurosurgery;  Laterality: N/A;   TUBAL LIGATION      Review of Systems:    All systems reviewed and negative except where noted in HPI.   Physical Examination:   BP 121/77   Pulse 72   Temp 98.1 F (36.7 C)   Ht 5\' 4"  (1.626 m)   Wt 120 lb (54.4 kg)   BMI 20.60 kg/m   General: Well-nourished, well-developed in no acute distress.  Lungs: Clear to auscultation bilaterally. Non-labored. Heart: Regular rate and rhythm, no murmurs rubs or gallops.  Abdomen: Bowel sounds are normal; Abdomen is Soft; No hepatosplenomegaly, masses or hernias;  No Abdominal Tenderness; No  guarding or rebound tenderness. Neuro: Alert and oriented x 3.  Grossly intact.  Psych: Alert and cooperative, normal mood and affect.   Imaging Studies: No results found.  Assessment and Plan:   Valerie [name] is a 73 y.o. y/o female returns for multiple chronic functional GI symptoms for many years.  Extensive GI workup unrevealing.  1.  Irritable Bowel Syndrome, Constipation predominant             Continue Rx: Amitiza 1 capsule twice daily with food.  Add MiraLAX 1 or 2 capfuls twice daily.  Adjust dose based on bowel  habits. Recommend high-fiber diet, 30 g daily, with fruits, vegetables, whole grains.   Continue dicyclomine 10 Mg 3 times daily as needed.  2.  GERD - Controlled on current treament.             Continue omeprazole 40 Mg twice daily.  Continue sucralfate 1 g 4 times daily as needed.  3.  Chronic nausea and vomiting  Refilled Zofran 8 Mg 1 tab 3 times daily as needed.  Celso Amy, PA-C  Follow up in 1 year or as needed if worsening GI symptoms.

## 2023-07-30 NOTE — Telephone Encounter (Signed)
 The patient called in to get the time for her appointment.

## 2023-07-31 ENCOUNTER — Ambulatory Visit (INDEPENDENT_AMBULATORY_CARE_PROVIDER_SITE_OTHER): Payer: Medicare HMO | Admitting: Physician Assistant

## 2023-07-31 ENCOUNTER — Encounter: Payer: Self-pay | Admitting: Physician Assistant

## 2023-07-31 VITALS — BP 121/77 | HR 72 | Temp 98.1°F | Ht 64.0 in | Wt 120.0 lb

## 2023-07-31 DIAGNOSIS — R112 Nausea with vomiting, unspecified: Secondary | ICD-10-CM | POA: Diagnosis not present

## 2023-07-31 DIAGNOSIS — K5904 Chronic idiopathic constipation: Secondary | ICD-10-CM

## 2023-07-31 DIAGNOSIS — R110 Nausea: Secondary | ICD-10-CM

## 2023-07-31 DIAGNOSIS — K219 Gastro-esophageal reflux disease without esophagitis: Secondary | ICD-10-CM

## 2023-07-31 DIAGNOSIS — K581 Irritable bowel syndrome with constipation: Secondary | ICD-10-CM | POA: Diagnosis not present

## 2023-07-31 MED ORDER — OMEPRAZOLE 40 MG PO CPDR: 40.0000 mg | DELAYED_RELEASE_CAPSULE | Freq: Two times a day (BID) | ORAL | 3 refills | Status: AC

## 2023-07-31 MED ORDER — SUCRALFATE 1 G PO TABS: 1.0000 g | ORAL_TABLET | Freq: Three times a day (TID) | ORAL | 11 refills | Status: DC

## 2023-07-31 MED ORDER — ONDANSETRON 8 MG PO TBDP: 8.0000 mg | ORAL_TABLET | Freq: Three times a day (TID) | ORAL | 11 refills | Status: AC | PRN

## 2023-07-31 MED ORDER — LUBIPROSTONE 24 MCG PO CAPS: 24.0000 ug | ORAL_CAPSULE | Freq: Two times a day (BID) | ORAL | 3 refills | Status: DC

## 2023-07-31 NOTE — Patient Instructions (Signed)
 For Constipation: Continue Amitiza 1 tablet twice daily WITH FOOD. Also Add OTC Miralax 1-2 Capfuls in a drink Twice daily. Also take OTC Fiber Gummies daily. Eat High Fiber diet (30 grams of fiber daily). Drink 64 ounces of fluids daily.

## 2023-08-13 ENCOUNTER — Other Ambulatory Visit: Payer: Self-pay

## 2023-08-13 DIAGNOSIS — I10 Essential (primary) hypertension: Secondary | ICD-10-CM

## 2023-08-13 MED ORDER — AMLODIPINE BESYLATE 5 MG PO TABS
5.0000 mg | ORAL_TABLET | Freq: Every day | ORAL | 0 refills | Status: DC
Start: 2023-08-13 — End: 2023-10-26

## 2023-08-27 ENCOUNTER — Telehealth: Payer: Self-pay | Admitting: Gastroenterology

## 2023-08-27 NOTE — Telephone Encounter (Signed)
 The patient called in wanting to schedule an appointment with Dr. Tobi Bastos. I informed her that the earliest available appointment would be at the end of April. While I was providing some potential dates, she mentioned that she cannot wait that long.

## 2023-08-28 DIAGNOSIS — M79641 Pain in right hand: Secondary | ICD-10-CM | POA: Diagnosis not present

## 2023-08-28 DIAGNOSIS — M79642 Pain in left hand: Secondary | ICD-10-CM | POA: Diagnosis not present

## 2023-08-28 DIAGNOSIS — S62001A Unspecified fracture of navicular [scaphoid] bone of right wrist, initial encounter for closed fracture: Secondary | ICD-10-CM | POA: Diagnosis not present

## 2023-08-28 DIAGNOSIS — S62002A Unspecified fracture of navicular [scaphoid] bone of left wrist, initial encounter for closed fracture: Secondary | ICD-10-CM | POA: Diagnosis not present

## 2023-08-30 ENCOUNTER — Encounter: Payer: Self-pay | Admitting: Family Medicine

## 2023-08-30 ENCOUNTER — Ambulatory Visit: Payer: Self-pay

## 2023-08-30 ENCOUNTER — Ambulatory Visit: Admitting: Family Medicine

## 2023-08-30 VITALS — BP 120/72 | HR 88 | Temp 98.0°F | Ht 65.0 in | Wt 121.2 lb

## 2023-08-30 DIAGNOSIS — M8580 Other specified disorders of bone density and structure, unspecified site: Secondary | ICD-10-CM

## 2023-08-30 DIAGNOSIS — S62115D Nondisplaced fracture of triquetrum [cuneiform] bone, left wrist, subsequent encounter for fracture with routine healing: Secondary | ICD-10-CM

## 2023-08-30 DIAGNOSIS — W19XXXD Unspecified fall, subsequent encounter: Secondary | ICD-10-CM

## 2023-08-30 MED ORDER — HYDROCODONE-ACETAMINOPHEN 5-325 MG PO TABS: 1.0000 | ORAL_TABLET | Freq: Three times a day (TID) | ORAL | 0 refills | Status: DC

## 2023-08-30 NOTE — Telephone Encounter (Signed)
 noted

## 2023-08-30 NOTE — Patient Instructions (Addendum)
 Elevate hands about heart as able.  Can use ice as needed.  Take ibuprofen 600-800 mg every eight hours as needed for pain.  For breakthrough pain  can use hydrocodone up to three times daily.  Plan bracing for likely 6-8 weeks.  Will move forward with ortho referral.   Go to ER if  extreme swelling, numbness of fingers, or if pain not controlled.

## 2023-08-30 NOTE — Telephone Encounter (Signed)
 Please call to triage ASAP.  With a fracture to her hands there can be significant swelling associated that can compress the nerves and cause compression syndrome.  The note is unclear and says she has tingling but does not notice numbness and tingling? Essentially if she has significant swelling in that hand and has developing decreased sensation at the tips of her fingers or her fingers are cold she would do better being seen at [facility] urgent orthopedic clinic ASAP for reevaluation.  We could go ahead and try to get this scheduled instead of her wasting time coming into see me.

## 2023-08-30 NOTE — Progress Notes (Signed)
 Patient ID: Valerie [name], female    DOB: May 05, 1951, 73 y.o.   MRN: [id number]  This visit was conducted in person.  BP 120/72   Pulse 88   Temp 98 F (36.7 C) (Oral)   Ht 5\' 5"  (1.651 m)   Wt 121 lb 4 oz (55 kg)   SpO2 95%   BMI 20.18 kg/m    CC:  Chief Complaint  Patient presents with   Fall    Pt had fall at daughter's house on 08/28/23. C/o B hand pain- worse in L.     Subjective:   HPI: Valerie [name] is a 73 y.o. female presenting on 08/30/2023 for Fall (Pt had fall at daughter's house on 08/28/23. C/o B hand pain- worse in L. )  2 days ago patient lost balance and fell at her daughter's house landing on bilateral hands. She was seen at an urgent care  towards [place].    Xray on  bilateral hand.. per pt.. told base of thumbs fractured.. no report in EPIC or imaging.... Was able to request from urgent care x-ray report.  X-ray reports shows dorsal tricaprin fracture no specification of displacement versus nondisplacement noted.  Set up with bilateral braces an recommended ASAP follow up with PCP.    Has bruising and swelling.  Given pain med.. thinks it is tramadol... has been using tramadol every 4 hours of pain, alternating with ibuprofen 400 mg .. Helps minimally.  Increased pain in left thumb, more so than right.  Has noted some slight tingling in left finger tips on left hand.  No colder than usual.   History of osteopenia. 9/20-24     Delbert Harness is her Ortho.  Relevant past medical, surgical, family and social history reviewed and updated as indicated. Interim medical history since our last visit reviewed. Allergies and medications reviewed and updated. Outpatient Medications Prior to Visit  Medication Sig Dispense Refill   acetaminophen (TYLENOL) 325 MG tablet Take 325 mg by mouth 2 (two) times daily as needed (pain or headaches).     amLODipine (NORVASC) 5 MG tablet Take 1 tablet (5 mg total) by mouth daily. for blood pressure. 90 tablet 0   atorvastatin  (LIPITOR) 10 MG tablet Take 1 tablet (10 mg total) by mouth daily. for cholesterol. 90 tablet 1   azelastine (ASTELIN) 0.1 % nasal spray Place 1 spray into both nostrils 2 (two) times daily. Use in each nostril as directed 30 mL 5   CALCIUM-VITAMIN D PO Take 2 tablets by mouth daily.     cetirizine (ZYRTEC ALLERGY) 10 MG tablet Take 1 tablet (10 mg total) by mouth daily. 30 tablet 5   cromolyn (GASTROCROM) 100 MG/5ML solution Take 200 mg by mouth 4 (four) times daily -  before meals and at bedtime.     cyclobenzaprine (FLEXERIL) 5 MG tablet Take 1 tablet (5 mg total) by mouth at bedtime as needed for muscle spasms. 15 tablet 0   dicyclomine (BENTYL) 10 MG capsule TAKE 1 CAPSULE THREE TIMES DAILY BEFORE MEALS 270 capsule 3   EPINEPHrine 0.3 mg/0.3 mL IJ SOAJ injection Inject 0.3 mg into the muscle as directed. 2 each 1   fluticasone (FLONASE) 50 MCG/ACT nasal spray USE 2 SPRAYS IN EACH NOSTRIL EVERY DAY AS NEEDED FOR ALLERGIES OR RHINITIS 48 g 2   lubiprostone (AMITIZA) 24 MCG capsule Take 1 capsule (24 mcg total) by mouth 2 (two) times daily with a meal. 180 capsule 3   mupirocin  ointment (BACTROBAN) 2 % Apply 1 Application topically 2 (two) times daily. 22 g 0   nitrofurantoin, macrocrystal-monohydrate, (MACROBID) 100 MG capsule Take 1 capsule (100 mg total) by mouth 2 (two) times daily. 14 capsule 0   omeprazole (PRILOSEC) 40 MG capsule Take 1 capsule (40 mg total) by mouth 2 (two) times daily. TAKE 1 CAPSULE TWICE DAILY FOR HEARTBURN 180 capsule 3   ondansetron (ZOFRAN-ODT) 8 MG disintegrating tablet Take 1 tablet (8 mg total) by mouth 3 (three) times daily as needed. 90 tablet 11   sucralfate (CARAFATE) 1 g tablet Take 1 tablet (1 g total) by mouth 4 (four) times daily -  with meals and at bedtime. 120 tablet 11   topiramate (TOPAMAX) 50 MG tablet Take 1 tablet (50 mg total) by mouth daily. For headache prevention 90 tablet 2   traMADol (ULTRAM) 50 MG tablet Take 50 mg by mouth 3 (three) times  daily as needed.     triamcinolone (KENALOG) 0.1 % paste Use as directed 1 Application in the mouth or throat 2 (two) times daily. 5 g 0   valACYclovir (VALTREX) 1000 MG tablet Take 2 tablets twice daily for 1 day. 4 tablet 0   valsartan (DIOVAN) 160 MG tablet Take 160 mg by mouth daily.     No facility-administered medications prior to visit.     Per HPI unless specifically indicated in ROS section below Review of Systems  Constitutional:  Negative for fatigue and fever.  HENT:  Negative for congestion.   Eyes:  Negative for pain.  Respiratory:  Negative for cough and shortness of breath.   Cardiovascular:  Negative for chest pain, palpitations and leg swelling.  Gastrointestinal:  Negative for abdominal pain.  Genitourinary:  Negative for dysuria and vaginal bleeding.  Musculoskeletal:  Positive for joint swelling. Negative for back pain.  Neurological:  Negative for syncope, light-headedness and headaches.  Psychiatric/Behavioral:  Negative for dysphoric mood.    Objective:  BP 120/72   Pulse 88   Temp 98 F (36.7 C) (Oral)   Ht 5\' 5"  (1.651 m)   Wt 121 lb 4 oz (55 kg)   SpO2 95%   BMI 20.18 kg/m   Wt Readings from Last 3 Encounters:  08/30/23 121 lb 4 oz (55 kg)  07/31/23 120 lb (54.4 kg)  07/13/23 119 lb (54 kg)      Physical Exam Constitutional:      General: She is not in acute distress.    Appearance: Normal appearance. She is well-developed. She is not ill-appearing or toxic-appearing.  HENT:     Head: Normocephalic.     Right Ear: Hearing, tympanic membrane, ear canal and external ear normal. Tympanic membrane is not erythematous, retracted or bulging.     Left Ear: Hearing, tympanic membrane, ear canal and external ear normal. Tympanic membrane is not erythematous, retracted or bulging.     Nose: No mucosal edema or rhinorrhea.     Right Sinus: No maxillary sinus tenderness or frontal sinus tenderness.     Left Sinus: No maxillary sinus tenderness or frontal  sinus tenderness.     Mouth/Throat:     Pharynx: Uvula midline.  Eyes:     General: Lids are normal. Lids are everted, no foreign bodies appreciated.     Conjunctiva/sclera: Conjunctivae normal.     Pupils: Pupils are equal, round, and reactive to light.  Neck:     Thyroid: No thyroid mass or thyromegaly.     Vascular: No carotid bruit.  Trachea: Trachea normal.  Cardiovascular:     Rate and Rhythm: Normal rate and regular rhythm.     Pulses: Normal pulses.     Heart sounds: Normal heart sounds, S1 normal and S2 normal. No murmur heard.    No friction rub. No gallop.  Pulmonary:     Effort: Pulmonary effort is normal. No tachypnea or respiratory distress.     Breath sounds: Normal breath sounds. No decreased breath sounds, wheezing, rhonchi or rales.  Abdominal:     General: Bowel sounds are normal.     Palpations: Abdomen is soft.     Tenderness: There is no abdominal tenderness.  Musculoskeletal:     Right wrist: Swelling, tenderness, bony tenderness and snuff box tenderness present. Decreased range of motion.     Left wrist: Swelling, tenderness, bony tenderness and snuff box tenderness present. Decreased range of motion.     Right hand: Swelling and bony tenderness present. Decreased range of motion. Normal strength. Normal sensation. Normal capillary refill. Normal pulse.     Left hand: Swelling and bony tenderness present. Decreased range of motion. Normal strength. Normal sensation. Normal capillary refill. Normal pulse.     Cervical back: Normal range of motion and neck supple.  Skin:    General: Skin is warm and dry.     Findings: No rash.  Neurological:     Mental Status: She is alert.  Psychiatric:        Mood and Affect: Mood is not anxious or depressed.        Speech: Speech normal.        Behavior: Behavior normal. Behavior is cooperative.        Thought Content: Thought content normal.        Judgment: Judgment normal.       Results for orders placed or  performed in visit on 07/13/23  POC COVID-19 BinaxNow   Collection Time: 07/13/23 11:01 AM  Result Value Ref Range   SARS Coronavirus 2 Ag Negative Negative  POCT Influenza A/B   Collection Time: 07/13/23 11:01 AM  Result Value Ref Range   Influenza A, POC Negative Negative   Influenza B, POC Negative Negative    Assessment and Plan  Closed nondisplaced fracture of triquetrum of left wrist with routine healing, subsequent encounter -     Ambulatory referral to Orthopedic Surgery  Osteopenia, unspecified location -     Ambulatory referral to Orthopedic Surgery  Accidental fall, subsequent encounter  Other orders -     HYDROcodone-Acetaminophen; Take 1 tablet by mouth every 8 (eight) hours.  Dispense: 15 tablet; Refill: 0  Acute, no current evidence of compartment syndrome.  Reviewed with patient and husband signs and symptoms of compartment syndrome including numbness in fingertips.  If this occurs along with severe swelling she will go to the emergency room for evaluation. In the meantime she will continue wearing the braces for likely the next 6 to 8 weeks.  She will elevate her hands above her heart as much as able to help with swelling.  She will use ice intermittently to help with pain and swelling. Encouraged her to use ibuprofen 600 to 800 mg 3 times a day as needed for pain and inflammation as long as there is no stomach irritation or reflux.  She needs to take the medication along with a proton pump inhibitor and on a full stomach. I will go ahead and refer her to her orthopedic doctor at [facility] for reevaluation within the  next 2 weeks.  She will call the urgent care to obtain a disc with the x-rays and she was given a copy of the x-ray read today.  No follow-ups on file.   Kerby Nora, MD

## 2023-08-30 NOTE — Telephone Encounter (Signed)
 I spoke with pt; pt said there is very slight tinglling in lt hand and no numbness in either hand. Pt said she cannot tell if swollen due to laced braces or badages on both hands. Pain level now is 6. Pt was notified as instructed by Dr Ermalene Searing and pt voiced understanding but wants to come to [facility] before going anywhere else. Pt said she would see Dr Ermalene Searing in office today. Sending note to dr Ermalene Searing.

## 2023-08-30 NOTE — Telephone Encounter (Signed)
 Chief Complaint: L Hand Pain Symptoms: difficulty sleeping, decreased ROM Frequency: Fall Tuesday Pertinent Negatives: Patient denies weakness, numbness Disposition: [] ED /[] Urgent Care (no appt availability in office) / [x] Appointment(In office/virtual)/ []  [facility]/ [] Home Care/ [] Refused Recommended Disposition /[] [place] [facility]/ []  Follow-up with PCP Additional Notes: Pt reports she had a fall Tuesday on a hardwood floor that resulted in fractures to both hands dx at [facility] following injury. Pt notes increasing pain to the left hand, notes tingling but denies numbness/tingling. OV scheduled. This RN educated pt on home care, new-worsening symptoms, when to call back/seek emergent care. Pt verbalized understanding and agrees to plan.    Copied from CRM [id number]. Topic: Clinical - Red Word Triage >> Aug 30, 2023 12:43 PM [place] J wrote: [facility] that prompted transfer to Nurse Triage: Patient fell on Tuesday, fracture on both hands. Pain has increased in right hand. Reason for Disposition  [1] SEVERE pain AND [2] not improved 2 hours after pain medicine/ice packs  Answer Assessment - Initial Assessment Questions 1. MECHANISM: "How did the injury happen?"     Pt tripped and fell on hardwood floor 2. ONSET: "When did the injury happen?" (Minutes or hours ago)      Tuesday 3. APPEARANCE of INJURY: "What does the injury look like?"      UC wrapped both hands 4. SEVERITY: "Can you use the hand normally?" "Can you bend your fingers into a ball and then fully open them?"     No 5. SIZE: For cuts, bruises, or swelling, ask: "How large is it?" (e.g., inches or centimeters;  entire hand or wrist)      Unk 6. PAIN: "Is there pain?" If Yes, ask: "How bad is the pain?"  (Scale 1-10; or mild, moderate, severe)     L hand is worse 7/10, constant 8. OTHER SYMPTOMS: "Do you have any other symptoms?"      Tingling in both hands  Protocols used: Hand and Wrist Injury-A-AH

## 2023-08-31 ENCOUNTER — Ambulatory Visit: Admitting: Primary Care

## 2023-09-02 ENCOUNTER — Other Ambulatory Visit: Payer: Self-pay | Admitting: Primary Care

## 2023-09-02 DIAGNOSIS — J309 Allergic rhinitis, unspecified: Secondary | ICD-10-CM

## 2023-09-03 ENCOUNTER — Telehealth: Payer: Self-pay

## 2023-09-03 NOTE — Telephone Encounter (Signed)
 Can we send this to our referrals team?

## 2023-09-03 NOTE — Telephone Encounter (Signed)
 Copied from CRM [id number]. Topic: Referral - Status >> Sep 03, 2023  8:36 AM Valerie [name] wrote: Reason for CRM: Pt called to follow up with ortho referral to Dr. Eulah Pont in [place]. Please call pt with update at [phone]

## 2023-09-10 DIAGNOSIS — M180 Bilateral primary osteoarthritis of first carpometacarpal joints: Secondary | ICD-10-CM | POA: Diagnosis not present

## 2023-09-10 DIAGNOSIS — S63502A Unspecified sprain of left wrist, initial encounter: Secondary | ICD-10-CM | POA: Diagnosis not present

## 2023-09-12 ENCOUNTER — Ambulatory Visit
Admission: RE | Admit: 2023-09-12 | Discharge: 2023-09-12 | Disposition: A | Discharge status: Home / Self Care | Source: Ambulatory Visit | Attending: Primary Care | Admitting: Primary Care

## 2023-09-12 DIAGNOSIS — R918 Other nonspecific abnormal finding of lung field: Secondary | ICD-10-CM | POA: Insufficient documentation

## 2023-09-13 ENCOUNTER — Other Ambulatory Visit: Payer: Self-pay | Admitting: Primary Care

## 2023-09-13 ENCOUNTER — Ambulatory Visit (INDEPENDENT_AMBULATORY_CARE_PROVIDER_SITE_OTHER): Payer: Medicare HMO

## 2023-09-13 VITALS — Ht 65.0 in | Wt 121.0 lb

## 2023-09-13 DIAGNOSIS — Z0000 Encounter for general adult medical examination without abnormal findings: Secondary | ICD-10-CM

## 2023-09-13 DIAGNOSIS — E785 Hyperlipidemia, unspecified: Secondary | ICD-10-CM

## 2023-09-13 NOTE — Patient Instructions (Addendum)
 Ms. Narine , Thank you for taking time to come for your Medicare Wellness Visit. I appreciate your ongoing commitment to your health goals. Please review the following plan we discussed and let me know if I can assist you in the future.   Referrals/Orders/Follow-Ups/Clinician Recommendations:  CPE and Labs visit made  Pt to call and make appointment for eye exam with local Optometrist  This is a list of the screening recommended for you and due dates:  Health Maintenance  Topic Date Due   Flu Shot  12/28/2023   Medicare Annual Wellness Visit  09/12/2024   Mammogram  02/18/2025   Colon Cancer Screening  07/28/2030   DTaP/Tdap/Td vaccine (5 - Td or Tdap) 03/08/2033   Pneumonia Vaccine  Completed   Hepatitis C Screening  Completed   Zoster (Shingles) Vaccine  Completed   HPV Vaccine  Aged Out   Meningitis B Vaccine  Aged Out   COVID-19 Vaccine  Discontinued    Advanced directives: (Declined) Advance directive discussed with you today. Even though you declined this today, please call our office should you change your mind, and we can give you the proper paperwork for you to fill out.  Next Medicare Annual Wellness Visit scheduled for next year: Yes 09/15/24 @ 8:50am televisit

## 2023-09-13 NOTE — Progress Notes (Signed)
 Subjective:   Valerie [name] is a 73 y.o. who presents for a Medicare Wellness preventive visit.  Visit Complete: Virtual I connected with  Enrique Sack on 09/13/23 by a audio enabled telemedicine application and verified that I am speaking with the correct person using two identifiers.  Patient Location: Home  Provider Location: Office/Clinic  I discussed the limitations of evaluation and management by telemedicine. The patient expressed understanding and agreed to proceed.  Vital Signs: Because this visit was a virtual/telehealth visit, some criteria may be missing or patient reported. Any vitals not documented were not able to be obtained and vitals that have been documented are patient reported.  VideoDeclined- This patient declined Librarian, academic. Therefore the visit was completed with audio only.  Persons Participating in Visit: Patient.  AWV Questionnaire: No: Patient Medicare AWV questionnaire was not completed prior to this visit.  Cardiac Risk Factors include: advanced age (>84men, >77 women);dyslipidemia;hypertension     Objective:    Today's Vitals   09/13/23 0850  Weight: 121 lb (54.9 kg)  Height: 5\' 5"  (1.651 m)   Body mass index is 20.14 kg/m.     09/13/2023    9:16 AM 09/15/2022    4:00 PM 09/14/2022    9:57 PM 09/14/2022   10:09 AM 09/12/2022    8:55 AM 07/17/2022   10:17 AM 01/04/2022    8:39 PM  Advanced Directives  Does Patient Have a Medical Advance Directive? No No No No No No No  Would patient like information on creating a medical advance directive?  No - Patient declined   No - Patient declined      Current Medications (verified) Outpatient Encounter Medications as of 09/13/2023  Medication Sig   acetaminophen (TYLENOL) 325 MG tablet Take 325 mg by mouth 2 (two) times daily as needed (pain or headaches).   amLODipine (NORVASC) 5 MG tablet Take 1 tablet (5 mg total) by mouth daily. for blood pressure.   atorvastatin  (LIPITOR) 10 MG tablet Take 1 tablet (10 mg total) by mouth daily. for cholesterol.   azelastine (ASTELIN) 0.1 % nasal spray Place 1 spray into both nostrils 2 (two) times daily. Use in each nostril as directed   CALCIUM-VITAMIN D PO Take 2 tablets by mouth daily.   cetirizine (ZYRTEC ALLERGY) 10 MG tablet Take 1 tablet (10 mg total) by mouth daily.   cromolyn (GASTROCROM) 100 MG/5ML solution Take 200 mg by mouth 4 (four) times daily -  before meals and at bedtime.   dicyclomine (BENTYL) 10 MG capsule TAKE 1 CAPSULE THREE TIMES DAILY BEFORE MEALS   EPINEPHrine 0.3 mg/0.3 mL IJ SOAJ injection Inject 0.3 mg into the muscle as directed.   fluticasone (FLONASE) 50 MCG/ACT nasal spray USE 2 SPRAYS IN EACH NOSTRIL EVERY DAY AS NEEDED FOR ALLERGIES OR RHINITIS   HYDROcodone-acetaminophen (NORCO/VICODIN) 5-325 MG tablet Take 1 tablet by mouth every 8 (eight) hours.   lubiprostone (AMITIZA) 24 MCG capsule Take 1 capsule (24 mcg total) by mouth 2 (two) times daily with a meal.   nitrofurantoin, macrocrystal-monohydrate, (MACROBID) 100 MG capsule Take 1 capsule (100 mg total) by mouth 2 (two) times daily.   omeprazole (PRILOSEC) 40 MG capsule Take 1 capsule (40 mg total) by mouth 2 (two) times daily. TAKE 1 CAPSULE TWICE DAILY FOR HEARTBURN   ondansetron (ZOFRAN-ODT) 8 MG disintegrating tablet Take 1 tablet (8 mg total) by mouth 3 (three) times daily as needed.   sucralfate (CARAFATE) 1 g tablet  Take 1 tablet (1 g total) by mouth 4 (four) times daily -  with meals and at bedtime.   topiramate (TOPAMAX) 50 MG tablet Take 1 tablet (50 mg total) by mouth daily. For headache prevention   traMADol (ULTRAM) 50 MG tablet Take 50 mg by mouth 3 (three) times daily as needed.   triamcinolone (KENALOG) 0.1 % paste Use as directed 1 Application in the mouth or throat 2 (two) times daily.   valACYclovir (VALTREX) 1000 MG tablet Take 2 tablets twice daily for 1 day.   valsartan (DIOVAN) 160 MG tablet Take 160 mg by mouth  daily.   cyclobenzaprine (FLEXERIL) 5 MG tablet Take 1 tablet (5 mg total) by mouth at bedtime as needed for muscle spasms. (Patient not taking: Reported on 09/13/2023)   mupirocin ointment (BACTROBAN) 2 % Apply 1 Application topically 2 (two) times daily. (Patient not taking: Reported on 09/13/2023)   No facility-administered encounter medications on file as of 09/13/2023.    Allergies (verified) Alpha-gal, Milk (cow), Milk-related compounds, Other, and Bactrim [sulfamethoxazole-trimethoprim]   History: Past Medical History:  Diagnosis Date   Acid reflux    Acute cystitis with hematuria 12/08/2020   Adenocarcinoma of the endometrium/uterus (HCC) 12/04/2000   Anal fissure    Anxiety    Atrophic vaginitis    COVID-19 virus infection 09/28/2020   Dehydration    Difficult intubation    limited neck mobility   Dysuria 08/13/2020   Esophagitis    Hernia    Present to proximal to umbillical    History of hiatal hernia    Hyperlipidemia    Hypertension    Hypokalemia 09/15/2022   Internal hemorrhoids    Intractable abdominal pain 09/15/2022   Lactic acidosis    Menorrhagia    Osteopenia    Partial small bowel obstruction (HCC) 01/26/2021   PONV (postoperative nausea and vomiting)    Recurrent UTI    Vertigo    Vitamin D deficiency    Past Surgical History:  Procedure Laterality Date   41 HOUR PH STUDY N/A 11/29/2015   Procedure: 24 HOUR PH STUDY;  Surgeon: Ruffin Frederick, MD;  Location: [facility];  Service: Gastroenterology;  Laterality: N/A;   ABDOMINAL HYSTERECTOMY  12/04/2000   also bilateral salpingo-oophorectomy   CARDIAC CATHETERIZATION  2006   CARPAL TUNNEL RELEASE  11/20/2011   Procedure: CARPAL TUNNEL RELEASE;  Surgeon: Mariam Dollar, MD;  Location: [facility];  Service: Neurosurgery;  Laterality: Left;  LEFT carpal tunnel release   CERVICAL SPINE SURGERY  2013   [facility]   CHOLECYSTECTOMY  1997   COLONOSCOPY WITH PROPOFOL N/A 07/27/2020   Procedure:  COLONOSCOPY WITH PROPOFOL;  Surgeon: Midge Minium, MD;  [facility];  Service: Endoscopy;  Laterality: N/A;   DILATION AND CURETTAGE OF UTERUS     11/15/2000   ENDOSCOPIC RETROGRADE CHOLANGIOPANCREATOGRAPHY (ERCP) WITH PROPOFOL N/A 09/14/2022   Procedure: ENDOSCOPIC RETROGRADE CHOLANGIOPANCREATOGRAPHY (ERCP) WITH PROPOFOL;  Surgeon: Meridee Score Netty Starring., MD;  Location: [facility];  Service: Gastroenterology;  Laterality: N/A;   ESOPHAGEAL MANOMETRY N/A 09/20/2015   Procedure: ESOPHAGEAL MANOMETRY (EM);  Surgeon: Ruffin Frederick, MD;  Location: [facility];  Service: Gastroenterology;  Laterality: N/A;   ESOPHAGOGASTRODUODENOSCOPY (EGD) WITH PROPOFOL N/A 05/17/2021   Procedure: ESOPHAGOGASTRODUODENOSCOPY (EGD) WITH PROPOFOL;  Surgeon: Pasty Spillers, MD;  Location: [facility] ENDOSCOPY;  Service: Endoscopy;  Laterality: N/A;   ESOPHAGOGASTRODUODENOSCOPY (EGD) WITH PROPOFOL N/A 07/17/2022   Procedure: ESOPHAGOGASTRODUODENOSCOPY (EGD) WITH PROPOFOL;  Surgeon: Wyline Mood, MD;  Location: [facility] ENDOSCOPY;  Service: Gastroenterology;  Laterality: N/A;   ESOPHAGOGASTRODUODENOSCOPY (EGD) WITH PROPOFOL N/A 09/14/2022   Procedure: ESOPHAGOGASTRODUODENOSCOPY (EGD) WITH PROPOFOL;  Surgeon: Brice Campi Albino Alu., MD;  Location: [facility] ENDOSCOPY;  Service: Gastroenterology;  Laterality: N/A;   EUS N/A 09/14/2022   Procedure: UPPER ENDOSCOPIC ULTRASOUND (EUS) RADIAL;  Surgeon: Normie Becton., MD;  Location: [facility] ENDOSCOPY;  Service: Gastroenterology;  Laterality: N/A;   HYSTEROSCOPY  11/15/2000   D & C, resection of endometrial polyps   LUMBAR SPINAL CORD SIMULATOR LEAD REMOVAL N/A 08/01/2021   Procedure: REMOVAL OF SPINAL CORD STIMULATOR;  Surgeon: Jodeen Munch, MD;  Location: [facility] ORS;  Service: Neurosurgery;  Laterality: N/A;   OOPHORECTOMY  12/04/2000   bilateral salpingo-oophorectomy done with TAH   REMOVAL OF STONES  09/14/2022   Procedure: REMOVAL OF STONES;  Surgeon:  Brice Campi Albino Alu., MD;  Location: [facility];  Service: Gastroenterology;;   [address]  09/14/2022   Procedure: [address];  Surgeon: Mansouraty, Albino Alu., MD;  Location: [facility];  Service: Gastroenterology;;   SPINAL CORD STIMULATOR INSERTION Bilateral 04/12/2020   Procedure: INSERTION CERVICAL SPINAL STIMULATOR PULSE GENERATOR;  Surgeon: Berta Brittle, MD;  Location: [facility] ORS;  Service: Neurosurgery;  Laterality: Bilateral;   SPINAL CORD STIMULATOR TRIAL N/A 03/22/2020   Procedure: CERVICAL SPINAL CORD STIMULATOR TRIAL PERCUTANEOUS;  Surgeon: Berta Brittle, MD;  Location: [facility] ORS;  Service: Neurosurgery;  Laterality: N/A;   TUBAL LIGATION     Family History  Problem Relation Age of Onset   Hypertension Father    Prostate cancer Father    Hypertension Sister    Lung cancer Sister    Diabetes Brother    Heart disease Brother    Hypertension Brother    Diabetes Brother    Heart disease Brother    Breast cancer Neg Hx    Social History   Socioeconomic History   Marital status: Married    Spouse name: Not on file   Number of children: Not on file   Years of education: Not on file   Highest education level: Not on file  Occupational History   Not on file  Tobacco Use   Smoking status: Never    Passive exposure: Never   Smokeless tobacco: Never  Vaping Use   Vaping status: Never Used  Substance and Sexual Activity   Alcohol use: No   Drug use: Never   Sexual activity: Yes    Birth control/protection: Post-menopausal, Surgical  Other Topics Concern   Not on file  Social History Narrative   Married   Lives in [place]   Has 2 children, 4 grandchildren   Enjoys walking, shopping.   Social Drivers of Corporate investment banker Strain: Low Risk  (09/13/2023)   Overall Financial Resource Strain (CARDIA)    Difficulty of Paying Living Expenses: Not hard at all  Food Insecurity: No Food Insecurity (09/13/2023)   Hunger Vital Sign    Worried About  Running Out of Food in the Last Year: Never true    Ran Out of Food in the Last Year: Never true  Transportation Needs: No Transportation Needs (09/13/2023)   PRAPARE - Administrator, Civil Service (Medical): No    Lack of Transportation (Non-Medical): No  Physical Activity: Insufficiently Active (09/13/2023)   Exercise Vital Sign    Days of Exercise per Week: 3 days    Minutes of Exercise per Session: 30 min  Stress: No Stress Concern Present (09/13/2023)   [employer] of  Occupational Health - Occupational Stress Questionnaire    Feeling of Stress : Only a little  Social Connections: Moderately Integrated (09/13/2023)   Social Connection and Isolation Panel [NHANES]    Frequency of Communication with Friends and Family: More than three times a week    Frequency of Social Gatherings with Friends and Family: More than three times a week    Attends Religious Services: More than 4 times per year    Active Member of [employer] or Organizations: No    Attends Banker Meetings: Never    Marital Status: Married    Tobacco Counseling Counseling given: Not Answered    Clinical Intake:  Pre-visit preparation completed: Yes  Pain : No/denies pain     BMI - recorded: 20.14 Nutritional Status: BMI of 19-24  Normal Nutritional Risks: Nausea/ vomitting/ diarrhea (has has nausea for 2 years that seems to be worsening) Diabetes: No  Lab Results  Component Value Date   HGBA1C 5.0 11/14/2022   HGBA1C 5.6 06/27/2021   HGBA1C 5.5 06/29/2020     How often do you need to have someone help you when you read instructions, pamphlets, or other written materials from your doctor or pharmacy?: 1 - Never     Comments: lives with husband Information entered by :: B.Thomes Burak,LPN   Activities of Daily Living     09/13/2023    9:16 AM 09/15/2022    4:00 PM  In your present state of health, do you have any difficulty performing the following activities:  Hearing? 0 0   Vision? 0 0  Difficulty concentrating or making decisions? 0 0  Walking or climbing stairs? 0 0  Dressing or bathing? 0 0  Doing errands, shopping? 0 0  Preparing Food and eating ? N   Using the Toilet? N   In the past six months, have you accidently leaked urine? N   Do you have problems with loss of bowel control? N   Managing your Medications? N   Managing your Finances? N   Housekeeping or managing your Housekeeping? N     Patient Care Team: Doreene Nest, NP as PCP - General (Internal Medicine) Kathyrn Sheriff, [facility] (Inactive) as Pharmacist (Pharmacist)  Indicate any recent Medical Services you may have received from other than [facility] providers in the past year (date may be approximate).     Assessment:   This is a routine wellness examination for Valerie [name].  Hearing/Vision screen Hearing Screening - Comments:: Pt says her hearing is good Vision Screening - Comments:: Pt says her vision is good [facility] [place]-last eye exam   Goals Addressed             This Visit's Progress    Patient advised to follow up on AWV and Vaccines   On track    Patient Stated       09/13/23- I will maintain and continue medications as prescribed.      Patient Stated   On track    09/13/23- Exercise accomplished and will continue with increasing water to 4 glasses per day     Track and Manage My Blood Pressure-Hypertension   On track    Timeframe:  Long-Range Goal Priority:  High Start Date:         04/26/21                    Expected End Date:   04/26/22  Follow Up Date Feb 2023   - check blood pressure daily - choose a place to take my blood pressure (home, clinic or office, retail store) - write blood pressure results in a log or diary    Why is this important?   You won't feel high blood pressure, but it can still hurt your blood vessels.  High blood pressure can cause heart or kidney problems. It can also cause a stroke.  Making  lifestyle changes like losing a little weight or eating less salt will help.  Checking your blood pressure at home and at different times of the day can help to control blood pressure.  If the doctor prescribes medicine remember to take it the way the doctor ordered.  Call the office if you cannot afford the medicine or if there are questions about it.     Notes:        Depression Screen     09/13/2023    9:02 AM 08/30/2023    2:49 PM 06/29/2023    3:02 PM 03/09/2023   10:53 AM 11/14/2022    9:39 AM 09/12/2022    8:55 AM 06/21/2021    3:11 PM  PHQ 2/9 Scores  PHQ - 2 Score 0 0 0 0 0 0 0  PHQ- 9 Score    0       Fall Risk     09/13/2023    8:57 AM 08/30/2023    2:49 PM 06/29/2023    3:02 PM 03/09/2023   10:53 AM 03/01/2023    8:27 AM  Fall Risk   Falls in the past year? 0 1 0 0 0  Number falls in past yr: 0 0 0 0 0  Injury with Fall? 1 1 0 0 0  Comment fell on hands      Risk for fall due to : No Fall Risks  No Fall Risks No Fall Risks No Fall Risks  Follow up Falls prevention discussed;Education provided  Falls evaluation completed Falls evaluation completed Falls evaluation completed    MEDICARE RISK AT HOME:  Medicare Risk at Home Any stairs in or around the home?: No If so, are there any without handrails?: No Home free of loose throw rugs in walkways, pet beds, electrical cords, etc?: Yes Adequate lighting in your home to reduce risk of falls?: Yes Life alert?: No Use of a cane, walker or w/c?: No Grab bars in the bathroom?: No Shower chair or bench in shower?: No Elevated toilet seat or a handicapped toilet?: No  TIMED UP AND GO:  Was the test performed?  No  Cognitive Function: 6CIT completed    10/21/2019   12:08 PM  MMSE - Mini Mental State Exam  Orientation to time 5  Orientation to Place 5  Registration 3  Attention/ Calculation 5  Recall 3  Language- repeat 1        09/13/2023    9:17 AM 09/12/2022    8:57 AM  6CIT Screen  What Year? 0 points 0  points  What month? 0 points 0 points  What time? 0 points 0 points  Count back from 20 0 points 0 points  Months in reverse 0 points 0 points  Repeat phrase 8 points 0 points  Total Score 8 points 0 points    Immunizations Immunization History  Administered Date(s) Administered   Fluad Quad(high Dose 65+) 02/14/2019, 02/23/2021   Fluad Trivalent(High Dose 65+) 03/01/2023   Hepatitis B 11/25/2015, 05/29/2016   Hepatitis B,  ADULT 09/22/2015   Influenza, High Dose Seasonal PF 05/29/2016   Influenza,inj,Quad PF,6+ Mos 02/11/2018, 03/14/2022   Influenza-Unspecified 02/27/2015, 04/19/2020   PFIZER(Purple Top)SARS-COV-2 Vaccination 06/20/2019, 07/11/2019, 02/26/2020, 01/04/2021   PNEUMOCOCCAL CONJUGATE-20 03/12/2021   Td 05/29/1997, 03/09/2023   Tdap 10/09/2014, 09/22/2015   Zoster Recombinant(Shingrix) 01/04/2021, 03/12/2021   Zoster, Live 07/28/2015    Screening Tests Health Maintenance  Topic Date Due   INFLUENZA VACCINE  12/28/2023   Medicare Annual Wellness (AWV)  09/12/2024   MAMMOGRAM  02/18/2025   Colonoscopy  07/28/2030   DTaP/Tdap/Td (5 - Td or Tdap) 03/08/2033   Pneumonia Vaccine 42+ Years old  Completed   Hepatitis C Screening  Completed   Zoster Vaccines- Shingrix  Completed   HPV VACCINES  Aged Out   Meningococcal B Vaccine  Aged Out   COVID-19 Vaccine  Discontinued    Health Maintenance  There are no preventive care reminders to display for this patient.  Health Maintenance Items Addressed: None right now Pt to make appointment for eye exam  Additional Screening:  Vision Screening: Recommended annual ophthalmology exams for early detection of glaucoma and other disorders of the eye.  Dental Screening: Recommended annual dental exams for proper oral hygiene  Community Resource Referral / Chronic Care Management: CRR required this visit?  No   CCM required this visit?  Appt scheduled with PCP    Plan:     I have personally reviewed and noted  the following in the patient's chart:   Medical and social history Use of alcohol, tobacco or illicit drugs  Current medications and supplements including opioid prescriptions. Patient is currently taking opioid prescriptions. Information provided to patient regarding non-opioid alternatives. Patient advised to discuss non-opioid treatment plan with their provider. Functional ability and status Nutritional status Physical activity Advanced directives List of other physicians Hospitalizations, surgeries, and ER visits in previous 12 months Vitals Screenings to include cognitive, depression, and falls Referrals and appointments  In addition, I have reviewed and discussed with patient certain preventive protocols, quality metrics, and best practice recommendations. A written personalized care plan for preventive services as well as general preventive health recommendations were provided to patient.    AJLA MCGEACHY, LPN   1/61/0960   After Visit Summary: (MyChart) Due to this being a telephonic visit, the after visit summary with patients personalized plan was offered to patient via MyChart   Notes: Please refer to Routing Comments.

## 2023-09-20 ENCOUNTER — Other Ambulatory Visit (INDEPENDENT_AMBULATORY_CARE_PROVIDER_SITE_OTHER)

## 2023-09-20 DIAGNOSIS — E785 Hyperlipidemia, unspecified: Secondary | ICD-10-CM | POA: Diagnosis not present

## 2023-09-20 LAB — COMPREHENSIVE METABOLIC PANEL WITH GFR
ALT: 10 U/L (ref 0–35)
AST: 17 U/L (ref 0–37)
Albumin: 4.2 g/dL (ref 3.5–5.2)
Alkaline Phosphatase: 70 U/L (ref 39–117)
BUN: 7 mg/dL (ref 6–23)
CO2: 30 meq/L (ref 19–32)
Calcium: 9.3 mg/dL (ref 8.4–10.5)
Chloride: 105 meq/L (ref 96–112)
Creatinine, Ser: 0.74 mg/dL (ref 0.40–1.20)
GFR: 81.6 mL/min (ref >60.00)
Glucose, Bld: 93 mg/dL (ref 70–99)
Potassium: 4.2 meq/L (ref 3.5–5.1)
Sodium: 141 meq/L (ref 135–145)
Total Bilirubin: 0.5 mg/dL (ref 0.2–1.2)
Total Protein: 6.9 g/dL (ref 6.0–8.3)

## 2023-09-20 LAB — LIPID PANEL
Cholesterol: 129 mg/dL (ref 0–200)
HDL: 40.6 mg/dL (ref >39.00)
LDL Cholesterol: 78 mg/dL (ref 0–99)
NonHDL: 88.71
Total CHOL/HDL Ratio: 3
Triglycerides: 56 mg/dL (ref 0.0–149.0)
VLDL: 11.2 mg/dL (ref 0.0–40.0)

## 2023-09-27 ENCOUNTER — Encounter: Admitting: Primary Care

## 2023-10-08 DIAGNOSIS — M180 Bilateral primary osteoarthritis of first carpometacarpal joints: Secondary | ICD-10-CM | POA: Diagnosis not present

## 2023-10-15 DIAGNOSIS — R0789 Other chest pain: Secondary | ICD-10-CM | POA: Diagnosis not present

## 2023-10-15 DIAGNOSIS — E785 Hyperlipidemia, unspecified: Secondary | ICD-10-CM | POA: Diagnosis not present

## 2023-10-15 DIAGNOSIS — R002 Palpitations: Secondary | ICD-10-CM | POA: Diagnosis not present

## 2023-10-15 DIAGNOSIS — Z7982 Long term (current) use of aspirin: Secondary | ICD-10-CM | POA: Diagnosis not present

## 2023-10-15 DIAGNOSIS — I700 Atherosclerosis of aorta: Secondary | ICD-10-CM | POA: Diagnosis not present

## 2023-10-15 DIAGNOSIS — I10 Essential (primary) hypertension: Secondary | ICD-10-CM | POA: Diagnosis not present

## 2023-10-20 ENCOUNTER — Other Ambulatory Visit: Payer: Self-pay | Admitting: Primary Care

## 2023-10-20 DIAGNOSIS — E785 Hyperlipidemia, unspecified: Secondary | ICD-10-CM

## 2023-10-26 ENCOUNTER — Other Ambulatory Visit: Payer: Self-pay | Admitting: Primary Care

## 2023-10-26 DIAGNOSIS — I10 Essential (primary) hypertension: Secondary | ICD-10-CM

## 2023-10-29 ENCOUNTER — Telehealth: Payer: Self-pay | Admitting: Gastroenterology

## 2023-10-29 NOTE — Telephone Encounter (Signed)
 Good afternoon Dr. Karene Oto  The following patient needs to be seen for IBS and severe constipation. She has requested you to be her provider after hearing great things from her daughter who is your patient. She has been a patient of [facility] but with the doctors leaving and all the new changes they are making, she wishes to be apart of our practice. Records are available in Epic. Please review and advise of scheduling. Thank you.

## 2023-10-30 ENCOUNTER — Encounter: Payer: Self-pay | Admitting: Gastroenterology

## 2023-11-01 DIAGNOSIS — R008 Other abnormalities of heart beat: Secondary | ICD-10-CM | POA: Diagnosis not present

## 2023-11-06 ENCOUNTER — Encounter: Payer: Self-pay | Admitting: Gastroenterology

## 2023-11-06 ENCOUNTER — Ambulatory Visit: Admitting: Gastroenterology

## 2023-11-06 VITALS — BP 120/84 | HR 72 | Ht 63.5 in | Wt 117.4 lb

## 2023-11-06 DIAGNOSIS — R110 Nausea: Secondary | ICD-10-CM | POA: Diagnosis not present

## 2023-11-06 DIAGNOSIS — K581 Irritable bowel syndrome with constipation: Secondary | ICD-10-CM

## 2023-11-06 MED ORDER — PROCHLORPERAZINE MALEATE 5 MG PO TABS
5.0000 mg | ORAL_TABLET | Freq: Two times a day (BID) | ORAL | 0 refills | Status: AC | PRN
Start: 2023-11-06 — End: ?

## 2023-11-06 NOTE — Patient Instructions (Addendum)
 Stop amitiza  due to nausea. Samples given of Linzess  145mcg take 1 tablet 30-45 minutes before first meal of the day with full glass of water. Recommend low residue diet  _______________________________________________________  If your blood pressure at your visit was 140/90 or greater, please contact your primary care physician to follow up on this.  _______________________________________________________  If you are age 71 or older, your body mass index should be between 23-30. Your Body mass index is 20.47 kg/m. If this is out of the aforementioned range listed, please consider follow up with your Primary Care Provider.  If you are age 88 or younger, your body mass index should be between 19-25. Your Body mass index is 20.47 kg/m. If this is out of the aformentioned range listed, please consider follow up with your Primary Care Provider.   ________________________________________________________  The  GI providers would like to encourage you to use MYCHART to communicate with providers for non-urgent requests or questions.  Due to long hold times on the telephone, sending your provider a message by [facility] may be a faster and more efficient way to get a response.  Please allow 48 business hours for a response.  Please remember that this is for non-urgent requests.  _______________________________________________________   Thank you for trusting me with your gastrointestinal care. Deanna May, RNP

## 2023-11-06 NOTE — Progress Notes (Signed)
 Chief Complaint:IBS- constipation, chronic nausea Primary GI Doctor: Dr. Karene Oto  HPI:  Patient is a  73  year old female patient with past medical history of hyperlipidemia, hypertension, anxiety,GERD, constipation with history of partial small bowel obstruction, choledocholithiasis status post ERCP with sphincterotomy, who was self referred to me for a complaint of IBS, constipation, chronic nausea .    Patient's previous GI was Dr. Antony Baumgartner and Brian Campanile, PA at [facility]. Reviewing the history she has had chronic issues with abdominal pain, nausea, and constipation. Last visit on 07/31/23 with [place], [place].    Extensive Negative GI Workup:  01/25/23 MRI abd/MRCP for N/V, abd pain status post chole Status post cholecystectomy. Mild postoperative biliary ductaldilatation. No acute findings of the abdomen to explain pain. -Gastric emptying study 11/17/2022: Normal. -EGD 06/2022 by Dr. Antony Baumgartner was Normal.  Gastric biopsies negative for H. pylori.  -Abdominal MRI / MRCP 08/2022 showed sludge in the bile duct post cholecystectomy.   -ERCP & EUS by Dr. Brice Campi 09/14/2022 showed 1 cm hiatal hernia, mild gastritis, and sludge / debri removed from biliary and pancreatic duct..  Normal pancreas.  -CT abdomen pelvis with contrast 09/15/2022 showed mild hyperdensity in the distal common bile duct, likely reflecting sludge post recent ERCP.  Otherwise no acute abnormality.  No masses.  No bowel inflammation.  Previous hysterectomy, appendectomy, and cholecystectomy.  -Labs 11/28/2022 showed normal CBC, CMP, and lipase.  Normal LFTs. -05/17/21 EGD with Dr. Arvella Bird- for nausea, vomiting, epi pain  normal esophagus. Erythematous mucosa in the antrume. Biopsied. Normal duodenal bulb, second portion of the duodenum and examined duodenum. Biopsies were obtained in the gastric body, at the incisure and in the gastric antrum. -Colonoscopy 07/2020 normal.  Excellent prep.  Small internal hemorrhoids.  Repeat in 10  years. -24 hour pH manometry test in 2017 on PPI which was normal, suggesting functional heartburn - no pathologic acid reflux.    Interval History     Patient presents to establish care with new gastroenterologist for IBS-C and chronic nausea, accompanied by her granddaughter.  Patient has history of chronic constipation. I reviewed her last note at [facility] and patient has trialed and failed many constipation medications. Per records and patient confirms:  Tried and failed MiraLAX, Senokot, Linzess , Amitizia 24mcg BID, Trulance, and Ibsrela .    She was recently given samples of Linzess  and Trulance recently. Did not feel either worked well. She cannot recall dose of the Linzess  but thinks it was lower dose. She takes dicyclomine  10 mg three times daily for abd cramping.     She reports chronic nausea for the past year with or without food. She was prescribed Ondansetron  by previous GI without any improvement. Reports poor appetite. She has lost 4 lbs in past few months. No know triggers. Denies stress at home. She has had several tests in the past for nausea which we reviewed including imaging and gastric emptying study.    Patient has history of GERD and taking Omeprazole  40 mg po daily. Patient taking  sucralfate  1 g 3 times daily prn she states she takes for abdominal pain. I did discuss with her how this can cause constipation.  No NSAIDs.   No alcohol use. Nonsmoker.  History of GERD with esophagitis since 2005. Has alpha gal and milk allergy .  Cholecystectomy in 1997   No family history of GI issues.   Wt Readings from Last 3 Encounters:  11/06/23 117 lb 6 oz (53.2 kg)  09/13/23 121 lb (54.9 kg)  08/30/23 121 lb 4 oz (55 kg)     Past Medical History:  Diagnosis Date   Acid reflux    Acute cystitis with hematuria 12/08/2020   Adenocarcinoma of the endometrium/uterus (HCC) 12/04/2000   Anal fissure    Anxiety    Atrophic vaginitis    COVID-19 virus infection 09/28/2020    Dehydration    Difficult intubation    limited neck mobility   Dysuria 08/13/2020   Esophagitis    Hernia    Present to proximal to umbillical    History of hiatal hernia    Hyperlipidemia    Hypertension    Hypokalemia 09/15/2022   Internal hemorrhoids    Intractable abdominal pain 09/15/2022   Lactic acidosis    Menorrhagia    Osteopenia    Partial small bowel obstruction (HCC) 01/26/2021   PONV (postoperative nausea and vomiting)    Recurrent UTI    Vertigo    Vitamin D  deficiency     Past Surgical History:  Procedure Laterality Date   67 HOUR PH STUDY N/A 11/29/2015   Procedure: 24 HOUR PH STUDY;  Surgeon: Danette Duos, MD;  Location: [facility];  Service: Gastroenterology;  Laterality: N/A;   ABDOMINAL HYSTERECTOMY  12/04/2000   also bilateral salpingo-oophorectomy   CARDIAC CATHETERIZATION  2006   CARPAL TUNNEL RELEASE  11/20/2011   Procedure: CARPAL TUNNEL RELEASE;  Surgeon: Ferris Hua, MD;  Location: [facility];  Service: Neurosurgery;  Laterality: Left;  LEFT carpal tunnel release   CERVICAL SPINE SURGERY  2013   [facility]   CHOLECYSTECTOMY  1997   COLONOSCOPY WITH PROPOFOL  N/A 07/27/2020   Procedure: COLONOSCOPY WITH PROPOFOL ;  Surgeon: Marnee Sink, MD;  Location: [facility];  Service: Endoscopy;  Laterality: N/A;   DILATION AND CURETTAGE OF UTERUS     11/15/2000   ENDOSCOPIC RETROGRADE CHOLANGIOPANCREATOGRAPHY (ERCP) WITH PROPOFOL  N/A 09/14/2022   Procedure: ENDOSCOPIC RETROGRADE CHOLANGIOPANCREATOGRAPHY (ERCP) WITH PROPOFOL ;  Surgeon: Brice Campi Albino Alu., MD;  Location: [facility];  Service: Gastroenterology;  Laterality: N/A;   ESOPHAGEAL MANOMETRY N/A 09/20/2015   Procedure: ESOPHAGEAL MANOMETRY (EM);  Surgeon: Danette Duos, MD;  Location: [facility];  Service: Gastroenterology;  Laterality: N/A;   ESOPHAGOGASTRODUODENOSCOPY (EGD) WITH PROPOFOL  N/A 05/17/2021   Procedure: ESOPHAGOGASTRODUODENOSCOPY (EGD) WITH PROPOFOL ;  Surgeon:  Irby Mannan, MD;  Location: [facility];  Service: Endoscopy;  Laterality: N/A;   ESOPHAGOGASTRODUODENOSCOPY (EGD) WITH PROPOFOL  N/A 07/17/2022   Procedure: ESOPHAGOGASTRODUODENOSCOPY (EGD) WITH PROPOFOL ;  Surgeon: Luke Salaam, MD;  Location: [facility];  Service: Gastroenterology;  Laterality: N/A;   ESOPHAGOGASTRODUODENOSCOPY (EGD) WITH PROPOFOL  N/A 09/14/2022   Procedure: ESOPHAGOGASTRODUODENOSCOPY (EGD) WITH PROPOFOL ;  Surgeon: Brice Campi Albino Alu., MD;  Location: [facility];  Service: Gastroenterology;  Laterality: N/A;   EUS N/A 09/14/2022   Procedure: UPPER ENDOSCOPIC ULTRASOUND (EUS) RADIAL;  Surgeon: Normie Becton., MD;  Location: [facility];  Service: Gastroenterology;  Laterality: N/A;   HYSTEROSCOPY  11/15/2000   D & C, resection of endometrial polyps   LUMBAR SPINAL CORD SIMULATOR LEAD REMOVAL N/A 08/01/2021   Procedure: REMOVAL OF SPINAL CORD STIMULATOR;  Surgeon: Jodeen Munch, MD;  Location: [facility];  Service: Neurosurgery;  Laterality: N/A;   OOPHORECTOMY  12/04/2000   bilateral salpingo-oophorectomy done with TAH   REMOVAL OF STONES  09/14/2022   Procedure: REMOVAL OF STONES;  Surgeon: Brice Campi Albino Alu., MD;  Location: [facility];  Service: Gastroenterology;;   [address]  09/14/2022   Procedure: [address];  Surgeon: Normie Becton., MD;  Location: [facility];  Service: Gastroenterology;;   SPINAL CORD STIMULATOR INSERTION Bilateral 04/12/2020   Procedure: INSERTION CERVICAL SPINAL STIMULATOR PULSE GENERATOR;  Surgeon: Berta Brittle, MD;  Location: [facility];  Service: Neurosurgery;  Laterality: Bilateral;   SPINAL CORD STIMULATOR TRIAL N/A 03/22/2020   Procedure: CERVICAL SPINAL CORD STIMULATOR TRIAL PERCUTANEOUS;  Surgeon: Berta Brittle, MD;  Location: [facility];  Service: Neurosurgery;  Laterality: N/A;   TUBAL LIGATION      Current Outpatient Medications  Medication Sig Dispense Refill   acetaminophen  (TYLENOL ) 325  MG tablet Take 325 mg by mouth 2 (two) times daily as needed (pain or headaches).     amLODipine  (NORVASC ) 5 MG tablet TAKE 1 TABLET EVERY DAY FOR BLOOD PRESSURE 90 tablet 0   atorvastatin  (LIPITOR) 10 MG tablet TAKE 1 TABLET DAILY FOR CHOLESTEROL 90 tablet 0   azelastine  (ASTELIN ) 0.1 % nasal spray Place 1 spray into both nostrils 2 (two) times daily. Use in each nostril as directed 30 mL 5   CALCIUM -VITAMIN D  PO Take 2 tablets by mouth daily.     cetirizine  (ZYRTEC  ALLERGY ) 10 MG tablet Take 1 tablet (10 mg total) by mouth daily. 30 tablet 5   cromolyn (GASTROCROM) 100 MG/5ML solution Take 200 mg by mouth 4 (four) times daily -  before meals and at bedtime.     cyclobenzaprine  (FLEXERIL ) 5 MG tablet Take 1 tablet (5 mg total) by mouth at bedtime as needed for muscle spasms. 15 tablet 0   dicyclomine  (BENTYL ) 10 MG capsule TAKE 1 CAPSULE THREE TIMES DAILY BEFORE MEALS 270 capsule 3   EPINEPHrine  0.3 mg/0.3 mL IJ SOAJ injection Inject 0.3 mg into the muscle as directed. 2 each 1   fluticasone  (FLONASE ) 50 MCG/ACT nasal spray USE 2 SPRAYS IN EACH NOSTRIL EVERY DAY AS NEEDED FOR ALLERGIES OR RHINITIS 48 g 0   HYDROcodone -acetaminophen  (NORCO/VICODIN) 5-325 MG tablet Take 1 tablet by mouth every 8 (eight) hours. 15 tablet 0   lubiprostone  (AMITIZA ) 24 MCG capsule Take 1 capsule (24 mcg total) by mouth 2 (two) times daily with a meal. 180 capsule 3   metoprolol  succinate (TOPROL -XL) 50 MG 24 hr tablet Take 25 mg by mouth as needed.     mupirocin  ointment (BACTROBAN ) 2 % Apply 1 Application topically 2 (two) times daily. 22 g 0   nitrofurantoin , macrocrystal-monohydrate, (MACROBID ) 100 MG capsule Take 1 capsule (100 mg total) by mouth 2 (two) times daily. 14 capsule 0   omeprazole  (PRILOSEC) 40 MG capsule Take 1 capsule (40 mg total) by mouth 2 (two) times daily. TAKE 1 CAPSULE TWICE DAILY FOR HEARTBURN 180 capsule 3   ondansetron  (ZOFRAN -ODT) 8 MG disintegrating tablet Take 1 tablet (8 mg total) by  mouth 3 (three) times daily as needed. 90 tablet 11   sucralfate  (CARAFATE ) 1 g tablet Take 1 tablet (1 g total) by mouth 4 (four) times daily -  with meals and at bedtime. 120 tablet 11   topiramate  (TOPAMAX ) 50 MG tablet Take 1 tablet (50 mg total) by mouth daily. For headache prevention 90 tablet 2   traMADol  (ULTRAM ) 50 MG tablet Take 50 mg by mouth 3 (three) times daily as needed.     triamcinolone  (KENALOG ) 0.1 % paste Use as directed 1 Application in the mouth or throat 2 (two) times daily. 5 g 0   valACYclovir  (VALTREX ) 1000 MG tablet Take 2 tablets twice daily for 1 day. 4 tablet 0   valsartan  (DIOVAN ) 160 MG tablet Take 160 mg  by mouth daily.     No current facility-administered medications for this visit.    Allergies as of 11/06/2023 - Review Complete 11/06/2023  Allergen Reaction Noted   Alpha-gal Anaphylaxis, Hives, and Other (See Comments) 09/15/2022   Milk (cow) Anaphylaxis 09/15/2022   Milk-related compounds Anaphylaxis and Other (See Comments) 09/15/2022   Other Anaphylaxis and Other (See Comments) 09/15/2022   Bactrim  [sulfamethoxazole -trimethoprim ] Other (See Comments) 03/22/2022    Family History  Problem Relation Age of Onset   Hypertension Father    Prostate cancer Father    Lung cancer Sister    Diabetes Sister    Diabetes Brother    Heart disease Brother    Hypertension Brother    Diabetes Brother    Heart disease Brother    Lung cancer Brother    Heart attack Brother    Hypertension Son    Other Son        spots on brain   Autoimmune disease Daughter    Breast cancer Neg Hx     Review of Systems:    Constitutional: No weight loss, fever, chills, weakness or fatigue HEENT: Eyes: No change in vision               Ears, Nose, Throat:  No change in hearing or congestion Skin: No rash or itching Cardiovascular: No chest pain, chest pressure or palpitations   Respiratory: No SOB or cough Gastrointestinal: See HPI and otherwise  negative Genitourinary: No dysuria or change in urinary frequency Neurological: No headache, dizziness or syncope Musculoskeletal: No new muscle or joint pain Hematologic: No bleeding or bruising Psychiatric: No history of depression or anxiety    Physical Exam:  Vital signs: BP 120/84 (BP Location: Left Arm, Patient Position: Sitting, Cuff Size: Normal)   Pulse 72   Ht 5' 3.5" (1.613 m) Comment: height measured without shoes  Wt 117 lb 6 oz (53.2 kg)   BMI 20.47 kg/m   Constitutional:   Pleasant female appears to be in NAD, Well developed, Well nourished, alert and cooperative Throat: Oral cavity and pharynx without inflammation, swelling or lesion.  Respiratory: Respirations even and unlabored. Lungs clear to auscultation bilaterally.   No wheezes, crackles, or rhonchi.  Cardiovascular: Normal S1, S2. Regular rate and rhythm. No peripheral edema, cyanosis or pallor.  Gastrointestinal:  Soft, nondistended, generalized abdominal tenderness. No rebound or guarding. Normal bowel sounds. No appreciable masses or hepatomegaly. Rectal:  Not performed.  Msk:  Symmetrical without gross deformities. Without edema, no deformity or joint abnormality.  Neurologic:  Alert and  oriented x4;  grossly normal neurologically.  Skin:   Dry and intact without significant lesions or rashes. Psychiatric: Oriented to person, place and time. Demonstrates good judgement and reason without abnormal affect or behaviors.  RELEVANT LABS AND IMAGING: CBC    Latest Ref Rng & Units 11/28/2022    8:49 AM 09/16/2022    5:52 AM 09/14/2022   11:40 PM  CBC  WBC 3.4 - 10.8 x10E3/uL 3.7  6.6  6.7   Hemoglobin 11.1 - 15.9 g/dL 16.1  9.8  09.6   Hematocrit 34.0 - 46.6 % 37.3  30.2  34.1   Platelets 150 - 400 K/uL  143  178      CMP     Latest Ref Rng & Units 09/20/2023    9:30 AM 11/28/2022    8:49 AM 09/16/2022    5:52 AM  CMP  Glucose 70 - 99 mg/dL 93  88  92  BUN 6 - 23 mg/dL 7  6  13    Creatinine 0.40 - 1.20  mg/dL 2.44  0.10  2.72   Sodium 135 - 145 mEq/L 141  143  138   Potassium 3.5 - 5.1 mEq/L 4.2  4.4  3.6   Chloride 96 - 112 mEq/L 105  105  108   CO2 19 - 32 mEq/L 30  24  25    Calcium  8.4 - 10.5 mg/dL 9.3  9.6  8.3   Total Protein 6.0 - 8.3 g/dL 6.9  6.6  5.9   Total Bilirubin 0.2 - 1.2 mg/dL 0.5  0.4  1.0   Alkaline Phos 39 - 117 U/L 70  68  62   AST 0 - 37 U/L 17  25  100   ALT 0 - 35 U/L 10  15  120      Lab Results  Component Value Date   TSH 2.10 04/03/2019    Assessment: Encounter Diagnoses  Name Primary?   Irritable bowel syndrome with constipation Yes   Chronic nausea      73 year old female patient who presents with multiple functional disorders including IBS, abdominal pain,  and chronic nausea. I will discontinue her Amitiza  as a main side effect is nausea and if it isn't working for her we can try something different. Provided samples of Linzess  145mg  po daily. If no improvement she Lynnell Fiumara respond to pro motility agent Motegrity. Also recommended low residue diet which Caldonia Leap help. She has had extensive workup in the past with imaging (US , CT scan, and MRI/MRCP). Colonoscopy 07/2020 normal. She is on several medications that can exacerbate constipation.   For the chronic nausea not relieved ondansetron  she has had two EGD's in 2024 and gastric emptying study 6/24 that was negative. Will d/c amitiza  to see if this helps. Hopefully if we get her bowels moving this will help with the nausea. She can use compazine prn. MRI brain (11/2021) with no evidence of acute intracranial abnormality.  Defer any imaging to Dr. Karene Oto.  Plan: - Recommend low residue diet -stop Amitiza  due to nausea SE -Switched to compazine prn nausea. Short script. Advised note to operate vehicle while taking medication.  - Samples of Linzess  145mcg po daily provided, Trevaun Rendleman consider pro motility drug like Motegrity if ineffective  -Follow-up with Dr. Karene Oto in 3 months  Thank you for the courtesy of this  consult. Please call me with any questions or concerns.   Shareta Fishbaugh, FNP-C [facility] 11/06/2023, 11:34 AM  Cc: Gabriel John, NP

## 2023-11-15 ENCOUNTER — Other Ambulatory Visit: Payer: Self-pay | Admitting: Primary Care

## 2023-11-15 DIAGNOSIS — J309 Allergic rhinitis, unspecified: Secondary | ICD-10-CM

## 2023-11-21 ENCOUNTER — Encounter: Payer: Self-pay | Admitting: Primary Care

## 2023-11-21 ENCOUNTER — Ambulatory Visit (INDEPENDENT_AMBULATORY_CARE_PROVIDER_SITE_OTHER): Admitting: Primary Care

## 2023-11-21 VITALS — BP 122/64 | HR 76 | Temp 97.8°F | Ht 63.5 in | Wt 118.0 lb

## 2023-11-21 DIAGNOSIS — E785 Hyperlipidemia, unspecified: Secondary | ICD-10-CM

## 2023-11-21 DIAGNOSIS — R002 Palpitations: Secondary | ICD-10-CM

## 2023-11-21 DIAGNOSIS — M8580 Other specified disorders of bone density and structure, unspecified site: Secondary | ICD-10-CM | POA: Diagnosis not present

## 2023-11-21 DIAGNOSIS — K5909 Other constipation: Secondary | ICD-10-CM | POA: Diagnosis not present

## 2023-11-21 DIAGNOSIS — I10 Essential (primary) hypertension: Secondary | ICD-10-CM | POA: Diagnosis not present

## 2023-11-21 DIAGNOSIS — K219 Gastro-esophageal reflux disease without esophagitis: Secondary | ICD-10-CM | POA: Diagnosis not present

## 2023-11-21 DIAGNOSIS — F32A Depression, unspecified: Secondary | ICD-10-CM

## 2023-11-21 DIAGNOSIS — Z0000 Encounter for general adult medical examination without abnormal findings: Secondary | ICD-10-CM

## 2023-11-21 DIAGNOSIS — G8929 Other chronic pain: Secondary | ICD-10-CM

## 2023-11-21 DIAGNOSIS — R110 Nausea: Secondary | ICD-10-CM | POA: Diagnosis not present

## 2023-11-21 DIAGNOSIS — M5441 Lumbago with sciatica, right side: Secondary | ICD-10-CM | POA: Diagnosis not present

## 2023-11-21 DIAGNOSIS — F411 Generalized anxiety disorder: Secondary | ICD-10-CM

## 2023-11-21 NOTE — Assessment & Plan Note (Signed)
 Bone density scan up-to-date.  Continue calcium  and vitamin D 

## 2023-11-21 NOTE — Assessment & Plan Note (Signed)
 Controlled.  Continue to monitor.

## 2023-11-21 NOTE — Assessment & Plan Note (Signed)
 Controlled.  Following with GI, reviewed office notes from June 2025.  Continue omeprazole  40 mg BID, Carafate  1 g QID PRN.

## 2023-11-21 NOTE — Assessment & Plan Note (Signed)
 Controlled.  Continue amlodipine  5 mg daily, valsartan  160 mg daily, metoprolol  succinate 25 milligrams as needed.

## 2023-11-21 NOTE — Assessment & Plan Note (Signed)
 Controlled. Continue atorvastatin  10 mg daily.  Reviewed lipid panel from April 2025.

## 2023-11-21 NOTE — Progress Notes (Signed)
 Subjective:    Patient ID: Valerie [name], female    DOB: 1950-10-12, 73 y.o.   MRN: [id number]  HPI  Valerie [name] is a very pleasant 73 y.o. female who presents today for complete physical and follow up of chronic conditions.  Immunizations: -Tetanus: Completed in 2024  -Shingles: Completed Shingrix series -Pneumonia: Completed Prevnar 20 in 2022  Diet: Fair diet.  Exercise: No regular exercise.  Eye exam: Completed several years ago  Dental exam: Completes semi-annually    Mammogram: Completed in September 2024 Bone Density Scan: Completed in September 2024  Colonoscopy: Completed in 2022, due 2032  BP Readings from Last 3 Encounters:  11/21/23 122/64  11/06/23 120/84  08/30/23 120/72       Review of Systems  Constitutional:  Negative for unexpected weight change.  HENT:  Negative for rhinorrhea.   Respiratory:  Negative for cough and shortness of breath.   Cardiovascular:  Negative for chest pain.  Gastrointestinal:  Positive for constipation and nausea. Negative for diarrhea and vomiting.  Genitourinary:  Negative for difficulty urinating.  Musculoskeletal:  Negative for arthralgias and myalgias.  Skin:  Negative for rash.  Allergic/Immunologic: Negative for environmental allergies.  Neurological:  Negative for dizziness and headaches.  Psychiatric/Behavioral:  The patient is not nervous/anxious.          Past Medical History:  Diagnosis Date   Acid reflux    Acute cystitis with hematuria 12/08/2020   Acute cystitis without hematuria 01/10/2023   Adenocarcinoma of the endometrium/uterus (HCC) 12/04/2000   Anal fissure    Anxiety    Atrophic vaginitis    COVID-19 virus infection 09/28/2020   Dehydration    Difficult intubation    limited neck mobility   Dysuria 08/13/2020   Esophagitis    Hernia    Present to proximal to umbillical    History of hiatal hernia    Hyperlipidemia    Hypertension    Hypokalemia 09/15/2022   Internal hemorrhoids     Intractable abdominal pain 09/15/2022   Lactic acidosis    Menorrhagia    Osteopenia    Partial small bowel obstruction (HCC) 01/26/2021   PONV (postoperative nausea and vomiting)    Recurrent UTI    Vertigo    Vitamin D  deficiency     Social History   Socioeconomic History   Marital status: Married    Spouse name: Not on file   Number of children: 2   Years of education: Not on file   Highest education level: Not on file  Occupational History   Occupation: retired  Tobacco Use   Smoking status: Never    Passive exposure: Never   Smokeless tobacco: Never  Vaping Use   Vaping status: Never Used  Substance and Sexual Activity   Alcohol use: No   Drug use: Never   Sexual activity: Yes    Birth control/protection: Post-menopausal, Surgical  Other Topics Concern   Not on file  Social History Narrative   Married   Lives in [place]   Has 2 children, 4 grandchildren   Enjoys walking, shopping.   Social Drivers of Corporate investment banker Strain: Low Risk  (09/13/2023)   Overall Financial Resource Strain (CARDIA)    Difficulty of Paying Living Expenses: Not hard at all  Food Insecurity: No Food Insecurity (09/13/2023)   Hunger Vital Sign    Worried About Running Out of Food in the Last Year: Never true    Ran Out of Food in  the Last Year: Never true  Transportation Needs: No Transportation Needs (09/13/2023)   PRAPARE - Administrator, Civil Service (Medical): No    Lack of Transportation (Non-Medical): No  Physical Activity: Insufficiently Active (09/13/2023)   Exercise Vital Sign    Days of Exercise per Week: 3 days    Minutes of Exercise per Session: 30 min  Stress: No Stress Concern Present (09/13/2023)   [employer] of Occupational Health - Occupational Stress Questionnaire    Feeling of Stress : Only a little  Social Connections: Moderately Integrated (09/13/2023)   Social Connection and Isolation Panel    Frequency of Communication with  Friends and Family: More than three times a week    Frequency of Social Gatherings with Friends and Family: More than three times a week    Attends Religious Services: More than 4 times per year    Active Member of [employer] or Organizations: No    Attends Banker Meetings: Never    Marital Status: Married  Catering manager Violence: Not At Risk (09/13/2023)   Humiliation, Afraid, Rape, and Kick questionnaire    Fear of Current or Ex-Partner: No    Emotionally Abused: No    Physically Abused: No    Sexually Abused: No    Past Surgical History:  Procedure Laterality Date   59 HOUR PH STUDY N/A 11/29/2015   Procedure: 24 HOUR PH STUDY;  Surgeon: Elspeth Deward Naval, MD;  Location: [facility];  Service: Gastroenterology;  Laterality: N/A;   ABDOMINAL HYSTERECTOMY  12/04/2000   also bilateral salpingo-oophorectomy   CARDIAC CATHETERIZATION  2006   CARPAL TUNNEL RELEASE  11/20/2011   Procedure: CARPAL TUNNEL RELEASE;  Surgeon: Arley SHAUNNA Helling, MD;  Location: [facility];  Service: Neurosurgery;  Laterality: Left;  LEFT carpal tunnel release   CERVICAL SPINE SURGERY  2013   [facility]   CHOLECYSTECTOMY  1997   COLONOSCOPY WITH PROPOFOL  N/A 07/27/2020   Procedure: COLONOSCOPY WITH PROPOFOL ;  Surgeon: Jinny Carmine, MD;  Location: [facility];  Service: Endoscopy;  Laterality: N/A;   DILATION AND CURETTAGE OF UTERUS     11/15/2000   ENDOSCOPIC RETROGRADE CHOLANGIOPANCREATOGRAPHY (ERCP) WITH PROPOFOL  N/A 09/14/2022   Procedure: ENDOSCOPIC RETROGRADE CHOLANGIOPANCREATOGRAPHY (ERCP) WITH PROPOFOL ;  Surgeon: Wilhelmenia Aloha Raddle., MD;  Location: [facility];  Service: Gastroenterology;  Laterality: N/A;   ESOPHAGEAL MANOMETRY N/A 09/20/2015   Procedure: ESOPHAGEAL MANOMETRY (EM);  Surgeon: Elspeth Deward Naval, MD;  Location: [facility];  Service: Gastroenterology;  Laterality: N/A;   ESOPHAGOGASTRODUODENOSCOPY (EGD) WITH PROPOFOL  N/A 05/17/2021   Procedure: ESOPHAGOGASTRODUODENOSCOPY  (EGD) WITH PROPOFOL ;  Surgeon: Janalyn Keene NOVAK, MD;  Location: [facility] ENDOSCOPY;  Service: Endoscopy;  Laterality: N/A;   ESOPHAGOGASTRODUODENOSCOPY (EGD) WITH PROPOFOL  N/A 07/17/2022   Procedure: ESOPHAGOGASTRODUODENOSCOPY (EGD) WITH PROPOFOL ;  Surgeon: Therisa Bi, MD;  Location[facility]COPY;  Service: Gastroenterology;  Laterality: N/A;   ESOPHAGOGASTRODUODENOSCOPY (EGD) WITH PROPOFOL  N/A 09/14/2022   Procedure: ESOPHAGOGASTRODUODENOSCOPY (EGD) WITH PROPOFOL ;  Surgeon: Wilhelmenia Aloha Raddle., MD;  Location: [facility];  Service: Gastroenterology;  Laterality: N/A;   EUS N/A 09/14/2022   Procedure: UPPER ENDOSCOPIC ULTRASOUND (EUS) RADIAL;  Surgeon: Wilhelmenia Aloha Raddle., MD;  Location: [facility];  Service: Gastroenterology;  Laterality: N/A;   HYSTEROSCOPY  11/15/2000   D & C, resection of endometrial polyps   LUMBAR SPINAL CORD SIMULATOR LEAD REMOVAL N/A 08/01/2021   Procedure: REMOVAL OF SPINAL CORD STIMULATOR;  Surgeon: Clois Fret, MD;  Location: [facility] ORS;  Service: Neurosurgery;  Laterality: N/A;  OOPHORECTOMY  12/04/2000   bilateral salpingo-oophorectomy done with TAH   REMOVAL OF STONES  09/14/2022   Procedure: REMOVAL OF STONES;  Surgeon: Wilhelmenia Aloha Raddle., MD;  Location: [facility];  Service: Gastroenterology;;   ANNETT  09/14/2022   Procedure: ANNETT;  Surgeon: Wilhelmenia Aloha Raddle., MD;  Location: [facility];  Service: Gastroenterology;;   SPINAL CORD STIMULATOR INSERTION Bilateral 04/12/2020   Procedure: INSERTION CERVICAL SPINAL STIMULATOR PULSE GENERATOR;  Surgeon: Bluford Standing, MD;  Location: [facility] OR[facility];  Service: Neurosurgery;  Laterality: Bilateral;   SPINAL CORD STIMULATOR TRIAL N/A 03/22/2020   Procedure: CERVICAL SPINAL CORD STIMULATOR TRIAL PERCUTANEOUS;  Surgeon: Bluford Standing, MD;  Location: [facility] ORS;  Service: Neurosurgery;  Laterality: N/A;   TUBAL LIGATION      Family History  Problem Relation Age of Onset   Hypertension  Father    Prostate cancer Father    Lung cancer Sister    Diabetes Sister    Diabetes Brother    Heart disease Brother    Hypertension Brother    Diabetes Brother    Heart disease Brother    Lung cancer Brother    Heart attack Brother    Hypertension Son    Other Son        spots on brain   Autoimmune disease Daughter    Breast cancer Neg Hx     Allergies  Allergen Reactions   Alpha-Gal Anaphylaxis, Hives and Other (See Comments)    Alpha-gal Syndrome (AGS)   Milk (Cow) Anaphylaxis    Alpha-gal Syndrome (AGS)   Milk-Related Compounds Anaphylaxis and Other (See Comments)    Alpha-gal Syndrome (AGS)   Other Anaphylaxis and Other (See Comments)    NO ANIMAL-DERIVED PRODUCTS!! Alpha-gal Syndrome (AGS)   Bactrim  [Sulfamethoxazole -Trimethoprim ] Other (See Comments)    Mouth ulcers and sores    Current Outpatient Medications on File Prior to Visit  Medication Sig Dispense Refill   acetaminophen  (TYLENOL ) 325 MG tablet Take 325 mg by mouth 2 (two) times daily as needed (pain or headaches).     amLODipine  (NORVASC ) 5 MG tablet TAKE 1 TABLET EVERY DAY FOR BLOOD PRESSURE 90 tablet 0   atorvastatin  (LIPITOR) 10 MG tablet TAKE 1 TABLET DAILY FOR CHOLESTEROL 90 tablet 0   azelastine  (ASTELIN ) 0.1 % nasal spray Place 1 spray into both nostrils 2 (two) times daily. Use in each nostril as directed 30 mL 5   CALCIUM -VITAMIN D  PO Take 2 tablets by mouth daily.     cetirizine  (ZYRTEC  ALLERGY ) 10 MG tablet Take 1 tablet (10 mg total) by mouth daily. 30 tablet 5   cromolyn (GASTROCROM) 100 MG/5ML solution Take 200 mg by mouth 4 (four) times daily -  before meals and at bedtime.     cyclobenzaprine  (FLEXERIL ) 5 MG tablet Take 1 tablet (5 mg total) by mouth at bedtime as needed for muscle spasms. 15 tablet 0   dicyclomine  (BENTYL ) 10 MG capsule TAKE 1 CAPSULE THREE TIMES DAILY BEFORE MEALS 270 capsule 3   EPINEPHrine  0.3 mg/0.3 mL IJ SOAJ injection Inject 0.3 mg into the muscle as directed. 2 each  1   fluticasone  (FLONASE ) 50 MCG/ACT nasal spray USE 2 SPRAYS IN EACH NOSTRIL EVERY DAY AS NEEDED FOR ALLERGIES OR RHINITIS 48 g 0   lubiprostone  (AMITIZA ) 24 MCG capsule Take 1 capsule (24 mcg total) by mouth 2 (two) times daily with a meal. 180 capsule 3   metoprolol  succinate (TOPROL -XL) 50 MG 24 hr tablet Take 25 mg by mouth as  needed.     mupirocin  ointment (BACTROBAN ) 2 % Apply 1 Application topically 2 (two) times daily. 22 g 0   omeprazole  (PRILOSEC) 40 MG capsule Take 1 capsule (40 mg total) by mouth 2 (two) times daily. TAKE 1 CAPSULE TWICE DAILY FOR HEARTBURN 180 capsule 3   ondansetron  (ZOFRAN -ODT) 8 MG disintegrating tablet Take 1 tablet (8 mg total) by mouth 3 (three) times daily as needed. 90 tablet 11   prochlorperazine  (COMPAZINE ) 5 MG tablet Take 1 tablet (5 mg total) by mouth every 12 (twelve) hours as needed for nausea or vomiting. 30 tablet 0   sucralfate  (CARAFATE ) 1 g tablet Take 1 tablet (1 g total) by mouth 4 (four) times daily -  with meals and at bedtime. 120 tablet 11   topiramate  (TOPAMAX ) 50 MG tablet Take 1 tablet (50 mg total) by mouth daily. For headache prevention 90 tablet 2   traMADol  (ULTRAM ) 50 MG tablet Take 50 mg by mouth 3 (three) times daily as needed.     triamcinolone  (KENALOG ) 0.1 % paste Use as directed 1 Application in the mouth or throat 2 (two) times daily. 5 g 0   valACYclovir  (VALTREX ) 1000 MG tablet Take 2 tablets twice daily for 1 day. 4 tablet 0   valsartan  (DIOVAN ) 160 MG tablet Take 160 mg by mouth daily.     No current facility-administered medications on file prior to visit.    BP 122/64   Pulse 76   Temp 97.8 F (36.6 C) (Temporal)   Ht 5' 3.5 (1.613 m)   Wt 118 lb (53.5 kg)   SpO2 98%   BMI 20.57 kg/m  Objective:   Physical Exam HENT:     Right Ear: Tympanic membrane and ear canal normal.     Left Ear: Tympanic membrane and ear canal normal.   Eyes:     Pupils: Pupils are equal, round, and reactive to light.     Cardiovascular:     Rate and Rhythm: Normal rate and regular rhythm.  Pulmonary:     Effort: Pulmonary effort is normal.     Breath sounds: Normal breath sounds.  Abdominal:     General: Bowel sounds are normal.     Palpations: Abdomen is soft.     Tenderness: There is no abdominal tenderness.   Musculoskeletal:        General: Normal range of motion.     Cervical back: Neck supple.   Skin:    General: Skin is warm and dry.   Neurological:     Mental Status: She is alert and oriented to person, place, and time.     Cranial Nerves: No cranial nerve deficit.     Deep Tendon Reflexes:     Reflex Scores:      Patellar reflexes are 2+ on the right side and 2+ on the left side.  Psychiatric:        Mood and Affect: Mood normal.           Assessment & Plan:  Gastroesophageal reflux disease, unspecified whether esophagitis present Assessment & Plan: Controlled.  Following with GI, reviewed office notes from June 2025.  Continue omeprazole  40 mg BID, Carafate  1 g QID PRN.   Chronic constipation Assessment & Plan: Waxes and wanes.  Following with GI. Reviewed office notes reviewed from June 2025.  Continue Linzezss 145 mcg PRN.     Chronic nausea Assessment & Plan: Chronic, ongoing. Unclear etiology and following with GI.  Do question if some  of her medication capsules are made of animal products as she does have an alpha gal allergy . We discussed to talk to her pharmacist about her capsules and pills to learn of any potential animal product within.  Continue Compazine  5 mg as needed   Chronic right-sided low back pain with right-sided sciatica Assessment & Plan: Stable.  Continue cyclobenzaprine  5 mg as needed   Essential hypertension Assessment & Plan: Controlled.  Continue amlodipine  5 mg daily, valsartan  160 mg daily, metoprolol  succinate 25 milligrams as needed.   Osteopenia, unspecified location Assessment & Plan: Bone density scan  up-to-date. Continue calcium  and vitamin D .   Depression, unspecified depression type Assessment & Plan: Controlled.  Continue to monitor.   Generalized anxiety disorder Assessment & Plan: Controlled. Continue to monitor.   Hyperlipidemia, unspecified hyperlipidemia type Assessment & Plan: Controlled. Continue atorvastatin  10 mg daily.  Reviewed lipid panel from April 2025.   Palpitations Assessment & Plan: Controlled.  Continue metoprolol  succinate 25 mg as needed.   Preventative health care Assessment & Plan: Immunizations UTD.  Mammogram and bone density scan UTD Colonoscopy UTD, due 2032  Discussed the importance of a healthy diet and regular exercise in order for weight loss, and to reduce the risk of further co-morbidity.  Exam stable. Labs reviewed  Follow up in 1 year for repeat physical.          Bradlee Bridgers K Kymberley Raz, NP

## 2023-11-21 NOTE — Patient Instructions (Signed)
 It was a pleasure to see you today!

## 2023-11-21 NOTE — Assessment & Plan Note (Signed)
 Controlled.  Continue metoprolol  succinate 25 mg as needed.

## 2023-11-21 NOTE — Assessment & Plan Note (Addendum)
 Immunizations UTD.  Mammogram and bone density scan UTD Colonoscopy UTD, due 2032  Discussed the importance of a healthy diet and regular exercise in order for weight loss, and to reduce the risk of further co-morbidity.  Exam stable. Labs reviewed  Follow up in 1 year for repeat physical.

## 2023-11-21 NOTE — Assessment & Plan Note (Signed)
 Waxes and wanes.  Following with GI. Reviewed office notes reviewed from June 2025.  Continue Linzezss 145 mcg PRN.

## 2023-11-21 NOTE — Assessment & Plan Note (Signed)
Stable. ? ?Continue cyclobenzaprine 5 mg as needed. ?

## 2023-11-21 NOTE — Assessment & Plan Note (Signed)
 Chronic, ongoing. Unclear etiology and following with GI.  Do question if some of her medication capsules are made of animal products as she does have an alpha gal allergy . We discussed to talk to her pharmacist about her capsules and pills to learn of any potential animal product within.  Continue Compazine  5 mg as needed

## 2023-12-03 ENCOUNTER — Emergency Department

## 2023-12-03 ENCOUNTER — Emergency Department
Admission: EM | Admit: 2023-12-03 | Discharge: 2023-12-03 | Disposition: A | Discharge status: Home / Self Care | Attending: Emergency Medicine | Admitting: Emergency Medicine

## 2023-12-03 ENCOUNTER — Other Ambulatory Visit: Payer: Self-pay

## 2023-12-03 ENCOUNTER — Encounter: Payer: Self-pay | Admitting: Emergency Medicine

## 2023-12-03 DIAGNOSIS — Z90710 Acquired absence of both cervix and uterus: Secondary | ICD-10-CM | POA: Diagnosis not present

## 2023-12-03 DIAGNOSIS — I10 Essential (primary) hypertension: Secondary | ICD-10-CM | POA: Diagnosis not present

## 2023-12-03 DIAGNOSIS — N3001 Acute cystitis with hematuria: Secondary | ICD-10-CM | POA: Diagnosis not present

## 2023-12-03 DIAGNOSIS — Z9049 Acquired absence of other specified parts of digestive tract: Secondary | ICD-10-CM | POA: Diagnosis not present

## 2023-12-03 DIAGNOSIS — R109 Unspecified abdominal pain: Secondary | ICD-10-CM

## 2023-12-03 LAB — URINALYSIS, ROUTINE W REFLEX MICROSCOPIC
Bilirubin Urine: NEGATIVE
Glucose, UA: NEGATIVE mg/dL
Ketones, ur: NEGATIVE mg/dL
Nitrite: NEGATIVE
Protein, ur: NEGATIVE mg/dL
Specific Gravity, Urine: 1.012 (ref 1.005–1.030)
pH: 5 (ref 5.0–8.0)

## 2023-12-03 LAB — COMPREHENSIVE METABOLIC PANEL WITH GFR
ALT: 14 U/L (ref 0–44)
AST: 22 U/L (ref 15–41)
Albumin: 4.2 g/dL (ref 3.5–5.0)
Alkaline Phosphatase: 59 U/L (ref 38–126)
Anion gap: 10 (ref 5–15)
BUN: 13 mg/dL (ref 8–23)
CO2: 25 mmol/L (ref 22–32)
Calcium: 9.5 mg/dL (ref 8.9–10.3)
Chloride: 104 mmol/L (ref 98–111)
Creatinine, Ser: 0.61 mg/dL (ref 0.44–1.00)
GFR, Estimated: >60 mL/min (ref >60)
Glucose, Bld: 96 mg/dL (ref 70–99)
Potassium: 3.6 mmol/L (ref 3.5–5.1)
Sodium: 139 mmol/L (ref 135–145)
Total Bilirubin: 0.6 mg/dL (ref 0.0–1.2)
Total Protein: 7.8 g/dL (ref 6.5–8.1)

## 2023-12-03 LAB — CBC
HCT: 39.5 % (ref 36.0–46.0)
Hemoglobin: 13.2 g/dL (ref 12.0–15.0)
MCH: 32.2 pg (ref 26.0–34.0)
MCHC: 33.4 g/dL (ref 30.0–36.0)
MCV: 96.3 fL (ref 80.0–100.0)
Platelets: 199 K/uL (ref 150–400)
RBC: 4.1 MIL/uL (ref 3.87–5.11)
RDW: 13.3 % (ref 11.5–15.5)
WBC: 4.4 K/uL (ref 4.0–10.5)
nRBC: 0 % (ref 0.0–0.2)

## 2023-12-03 LAB — LIPASE, BLOOD: Lipase: 55 U/L — ABNORMAL HIGH (ref 11–51)

## 2023-12-03 MED ORDER — CEPHALEXIN 500 MG PO CAPS
500.0000 mg | ORAL_CAPSULE | Freq: Once | ORAL | Status: AC
  Administered 2023-12-03: 500 mg via ORAL
  Filled 2023-12-03: qty 1

## 2023-12-03 MED ORDER — CEPHALEXIN 500 MG PO CAPS: 500.0000 mg | ORAL_CAPSULE | Freq: Three times a day (TID) | ORAL | 0 refills | Status: AC

## 2023-12-03 MED ORDER — IOHEXOL 300 MG/ML  SOLN
80.0000 mL | Freq: Once | INTRAMUSCULAR | Status: AC | PRN
  Administered 2023-12-03: 80 mL via INTRAVENOUS

## 2023-12-03 NOTE — ED Provider Notes (Signed)
 [facility] Provider Note    Event Date/Time   First MD Initiated Contact with Patient 12/03/23 2108     (approximate)   History   Abdominal Pain   HPI  Valerie [name] is a 73 y.o. female with history of hypertension, hernia, and as listed in EMR presents to the emergency department for treatment and evaluation of right side abdominal pain with nausea.  Symptoms started few weeks ago but has since gotten worse.  No diarrhea or vomiting.     Physical Exam    Vitals:   12/03/23 1635 12/03/23 2151  BP: (!) 154/87 127/76  Pulse: 93 61  Resp: 17 18  Temp: 98.6 F (37 C) 97.9 F (36.6 C)  SpO2: 99% 100%    General: Awake, no distress.  CV:  Good peripheral perfusion.  Resp:  Normal effort.  Abd:  No distention.  Other:     ED Results / Procedures / Treatments   Labs (all labs ordered are listed, but only abnormal results are displayed)  Labs Reviewed  LIPASE, BLOOD - Abnormal; Notable for the following components:      Result Value   Lipase 55 (*)    All other components within normal limits  URINALYSIS, ROUTINE W REFLEX MICROSCOPIC - Abnormal; Notable for the following components:   Color, Urine YELLOW (*)    APPearance CLEAR (*)    Hgb urine dipstick MODERATE (*)    Leukocytes,Ua SMALL (*)    Bacteria, UA RARE (*)    All other components within normal limits  URINE CULTURE  COMPREHENSIVE METABOLIC PANEL WITH GFR  CBC     EKG  Not indicated   RADIOLOGY  Image and radiology report reviewed and interpreted by me. Radiology report consistent with the same.  CT abdomen pelvis negative for acute concerns.  PROCEDURES:  Critical Care performed: No  Procedures   MEDICATIONS ORDERED IN ED:  Medications  iohexol  (OMNIPAQUE ) 300 MG/ML solution 80 mL (80 mLs Intravenous Contrast Given 12/03/23 2026)  cephALEXin  (KEFLEX ) capsule 500 mg (500 mg Oral Given 12/03/23 2156)     IMPRESSION / MDM / ASSESSMENT AND PLAN / ED COURSE    I have reviewed the triage note and vital signs. Vital signs normal   Differential diagnosis includes, but is not limited to, choledocholithiasis, liver disease, small bowel obstruction, colitis, constipation, UTI  Patient's presentation is most consistent with acute presentation with potential threat to life or bodily function.  73 year old female presenting to the emergency department for ongoing abdominal pain.  See HPI for further details.  While awaiting ER room assignment, labs were drawn.  CBC is normal.  CMP is normal as well.  Lipase is very slightly elevated at 55.  Urinalysis has moderate amount of hemoglobin, with small leukocytes, 11-20 white blood cells, rare bacteria.  CT of the abdomen and pelvis is negative for acute concerns.  Symptoms potentially related to UTI.  Patient states that she has been having to urinate frequently and has intermittently had some burning.  She denies flank pain.  No fever.  Plan will be to treat her with 3 days of Keflex  and have her follow-up with her primary care provider if not improving.  If the abdominal pain continues, changes, or worsens and she is unable to see her primary care provider, she was advised to return to the emergency department.      FINAL CLINICAL IMPRESSION(S) / ED DIAGNOSES   Final diagnoses:  Acute cystitis with hematuria  Abdominal pain, unspecified abdominal location     Rx / DC Orders   ED Discharge Orders          Ordered    cephALEXin  (KEFLEX ) 500 MG capsule  3 times daily        12/03/23 2149             Note:  This document was prepared using Dragon voice recognition software and may include unintentional dictation errors.   Herlinda Kirk NOVAK, FNP 12/03/23 2336    Willo Dunnings, MD 12/03/23 2340

## 2023-12-03 NOTE — ED Notes (Signed)
 Introduced self to pt. Daughter at bedside. PT stated this abdominal pain has been on and off for 3 years but worse lately. Pt stated she is nauseous and stomach feels hard.

## 2023-12-03 NOTE — ED Triage Notes (Signed)
 Patient to ED via POV for abd pain- worse on right side with nausea. Ongoing for a few weeks but getting worse. Denies diarrhea or vomiting.

## 2023-12-05 LAB — URINE CULTURE

## 2023-12-09 ENCOUNTER — Other Ambulatory Visit: Payer: Self-pay

## 2023-12-09 ENCOUNTER — Emergency Department

## 2023-12-09 ENCOUNTER — Emergency Department
Admission: EM | Admit: 2023-12-09 | Discharge: 2023-12-09 | Disposition: A | Discharge status: Home / Self Care | Attending: Emergency Medicine | Admitting: Emergency Medicine

## 2023-12-09 DIAGNOSIS — R079 Chest pain, unspecified: Secondary | ICD-10-CM | POA: Diagnosis not present

## 2023-12-09 DIAGNOSIS — I700 Atherosclerosis of aorta: Secondary | ICD-10-CM | POA: Diagnosis not present

## 2023-12-09 DIAGNOSIS — R002 Palpitations: Secondary | ICD-10-CM | POA: Diagnosis not present

## 2023-12-09 DIAGNOSIS — E876 Hypokalemia: Secondary | ICD-10-CM | POA: Diagnosis not present

## 2023-12-09 LAB — CBC
HCT: 38.4 % (ref 36.0–46.0)
Hemoglobin: 13.1 g/dL (ref 12.0–15.0)
MCH: 32.8 pg (ref 26.0–34.0)
MCHC: 34.1 g/dL (ref 30.0–36.0)
MCV: 96.2 fL (ref 80.0–100.0)
Platelets: 184 K/uL (ref 150–400)
RBC: 3.99 MIL/uL (ref 3.87–5.11)
RDW: 13.3 % (ref 11.5–15.5)
WBC: 4.2 K/uL (ref 4.0–10.5)
nRBC: 0 % (ref 0.0–0.2)

## 2023-12-09 LAB — BASIC METABOLIC PANEL WITH GFR
Anion gap: 9 (ref 5–15)
BUN: 10 mg/dL (ref 8–23)
CO2: 26 mmol/L (ref 22–32)
Calcium: 9.5 mg/dL (ref 8.9–10.3)
Chloride: 107 mmol/L (ref 98–111)
Creatinine, Ser: 1.02 mg/dL — ABNORMAL HIGH (ref 0.44–1.00)
GFR, Estimated: 59 mL/min — ABNORMAL LOW (ref >60)
Glucose, Bld: 105 mg/dL — ABNORMAL HIGH (ref 70–99)
Potassium: 3.2 mmol/L — ABNORMAL LOW (ref 3.5–5.1)
Sodium: 142 mmol/L (ref 135–145)

## 2023-12-09 LAB — TROPONIN I (HIGH SENSITIVITY): Troponin I (High Sensitivity): 4 ng/L (ref <18)

## 2023-12-09 NOTE — ED Provider Notes (Signed)
 [facility] Provider Note   Event Date/Time   First MD Initiated Contact with Patient 12/09/23 2159     (approximate) History  Irregular Heart Beat (X 1 week)  HPI Valerie [name] is a 73 y.o. female with a past medical history of palpitations who presents complaining of intermittent palpitations over the last week.  Patient states that she has an irregular heartbeat intermittently.  Patient is not on any anticoagulation and does not know what irregular rhythm she has.  Patient does complain of some chest discomfort ROS: Patient currently denies any vision changes, tinnitus, difficulty speaking, facial droop, sore throat, chest pain, shortness of breath, abdominal pain, nausea/vomiting/diarrhea, dysuria, or weakness/numbness/paresthesias in any extremity   Physical Exam  Triage Vital Signs: ED Triage Vitals  Encounter Vitals Group     BP 12/09/23 1914 130/85     Girls Systolic BP Percentile --      Girls Diastolic BP Percentile --      Boys Systolic BP Percentile --      Boys Diastolic BP Percentile --      Pulse Rate 12/09/23 1914 86     Resp 12/09/23 1914 16     Temp 12/09/23 1914 98 F (36.7 C)     Temp Source 12/09/23 1914 Oral     SpO2 12/09/23 1914 98 %     Weight 12/09/23 1915 116 lb 13.5 oz (53 kg)     Height 12/09/23 1915 5' 4 (1.626 m)     Head Circumference --      Peak Flow --      Pain Score 12/09/23 1914 0     Pain Loc --      Pain Education --      Exclude from Growth Chart --    Most recent vital signs: Vitals:   12/09/23 2214 12/09/23 2234  BP: (!) 153/70 118/66  Pulse: 68 60  Resp: 19 16  Temp:  97.7 F (36.5 C)  SpO2: 100% 97%   General: Awake, oriented x4. CV:  Good peripheral perfusion. Resp:  Normal effort. Abd:  No distention. Other:  Elderly well-developed, well-nourished Caucasian female resting comfortably in no acute distress ED Results / Procedures / Treatments  Labs (all labs ordered are listed, but only  abnormal results are displayed) Labs Reviewed  BASIC METABOLIC PANEL WITH GFR - Abnormal; Notable for the following components:      Result Value   Potassium 3.2 (*)    Glucose, Bld 105 (*)    Creatinine, Ser 1.02 (*)    GFR, Estimated 59 (*)    All other components within normal limits  CBC  TROPONIN I (HIGH SENSITIVITY)   EKG ED ECG REPORT I, Artist MARLA Kerns, the attending physician, personally viewed and interpreted this ECG. Date: 12/09/2023 EKG Time: 1911 Rate: 91 Rhythm: normal sinus rhythm QRS Axis: normal Intervals: normal ST/T Wave abnormalities: normal Narrative Interpretation: Normal sinus rhythm with frequent PACs no evidence of acute ischemia RADIOLOGY ED MD interpretation: 2 view chest x-ray interpreted by me shows no evidence of acute abnormalities including no pneumonia, pneumothorax, or widened mediastinum - All radiology independently interpreted and agree with radiology assessment Official radiology report(s): DG Chest 2 View Result Date: 12/09/2023 CLINICAL DATA:  Chest pain. EXAM: CHEST - 2 VIEW COMPARISON:  02/15/2021, CT 09/12/2023 FINDINGS: The cardiomediastinal contours are normal. Aortic atherosclerosis. Pulmonary vasculature is normal. No consolidation, pleural effusion, or pneumothorax. No acute osseous abnormalities are seen. IMPRESSION: No acute chest findings.  Electronically Signed   By: Andrea Gasman M.D.   On: 12/09/2023 19:53   PROCEDURES: Critical Care performed: No Procedures MEDICATIONS ORDERED IN ED: Medications - No data to display IMPRESSION / MDM / ASSESSMENT AND PLAN / ED COURSE  I reviewed the triage vital signs and the nursing notes.                             The patient is on the cardiac monitor to evaluate for evidence of arrhythmia and/or significant heart rate changes. Patient's presentation is most consistent with acute presentation with potential threat to life or bodily function. 73 year old female presents with  palpitations. EKG: No STEMI and no evidence of Brugada's sign, delta wave, epsilon wave, significantly prolonged QTc, or malignant arrhythmia. Based on H&P and testing, this patient appears to be low risk for emergent causes of palpitations such as, but not limited to, a malignant cardiac arrhythmia, ACS, pulmonary embolism, thyrotoxicosis, PNA, PTX.  The patient has been given strict return precautions and understands the need for further outpatient testing and treatment.  Dispo: Discharge home with cardiology follow-up   FINAL CLINICAL IMPRESSION(S) / ED DIAGNOSES   Final diagnoses:  Palpitations   Rx / DC Orders   ED Discharge Orders     None      Note:  This document was prepared using Dragon voice recognition software and may include unintentional dictation errors.   Jossie Artist POUR, MD 12/10/23 [phone]

## 2023-12-09 NOTE — ED Triage Notes (Signed)
 Pt to ed from home via POV for irregular heart beat x 1 week. Pt has seen the heart doctor for same in the past. Pt doesn't take meds for Afib. Pt has mild chest discomfort. Pt is caox4, in no acute distress and ambulatory in triage.

## 2023-12-21 ENCOUNTER — Ambulatory Visit: Admitting: Physician Assistant

## 2023-12-27 ENCOUNTER — Ambulatory Visit: Admitting: Gastroenterology

## 2024-01-07 ENCOUNTER — Other Ambulatory Visit: Payer: Self-pay | Admitting: Primary Care

## 2024-01-07 DIAGNOSIS — E785 Hyperlipidemia, unspecified: Secondary | ICD-10-CM

## 2024-01-18 ENCOUNTER — Ambulatory Visit: Admitting: Gastroenterology

## 2024-01-21 ENCOUNTER — Other Ambulatory Visit: Payer: Self-pay | Admitting: Primary Care

## 2024-01-21 DIAGNOSIS — Z1231 Encounter for screening mammogram for malignant neoplasm of breast: Secondary | ICD-10-CM

## 2024-01-28 ENCOUNTER — Other Ambulatory Visit: Payer: Self-pay | Admitting: Primary Care

## 2024-01-28 DIAGNOSIS — J309 Allergic rhinitis, unspecified: Secondary | ICD-10-CM

## 2024-02-12 ENCOUNTER — Other Ambulatory Visit: Payer: Self-pay | Admitting: Internal Medicine

## 2024-02-20 ENCOUNTER — Ambulatory Visit
Admission: RE | Admit: 2024-02-20 | Discharge: 2024-02-20 | Disposition: A | Discharge status: Home / Self Care | Source: Ambulatory Visit | Attending: Primary Care | Admitting: Primary Care

## 2024-02-20 DIAGNOSIS — Z1231 Encounter for screening mammogram for malignant neoplasm of breast: Secondary | ICD-10-CM | POA: Insufficient documentation

## 2024-02-21 ENCOUNTER — Other Ambulatory Visit: Payer: Self-pay

## 2024-02-21 DIAGNOSIS — R399 Unspecified symptoms and signs involving the genitourinary system: Secondary | ICD-10-CM

## 2024-02-22 ENCOUNTER — Ambulatory Visit: Payer: Self-pay | Admitting: Primary Care

## 2024-02-22 ENCOUNTER — Encounter: Payer: Self-pay | Admitting: Urology

## 2024-02-22 ENCOUNTER — Ambulatory Visit: Admitting: Urology

## 2024-02-22 ENCOUNTER — Other Ambulatory Visit
Admission: RE | Admit: 2024-02-22 | Discharge: 2024-02-22 | Disposition: A | Discharge status: Home / Self Care | Attending: Urology | Admitting: Urology

## 2024-02-22 VITALS — BP 147/79 | HR 66 | Wt 116.0 lb

## 2024-02-22 DIAGNOSIS — N390 Urinary tract infection, site not specified: Secondary | ICD-10-CM | POA: Insufficient documentation

## 2024-02-22 DIAGNOSIS — N3000 Acute cystitis without hematuria: Secondary | ICD-10-CM

## 2024-02-22 DIAGNOSIS — R399 Unspecified symptoms and signs involving the genitourinary system: Secondary | ICD-10-CM | POA: Diagnosis not present

## 2024-02-22 DIAGNOSIS — R102 Pelvic and perineal pain: Secondary | ICD-10-CM

## 2024-02-22 DIAGNOSIS — Z87440 Personal history of urinary (tract) infections: Secondary | ICD-10-CM | POA: Diagnosis not present

## 2024-02-22 LAB — URINALYSIS, COMPLETE (UACMP) WITH MICROSCOPIC
Bilirubin Urine: NEGATIVE
Glucose, UA: NEGATIVE mg/dL
Ketones, ur: NEGATIVE mg/dL
Nitrite: POSITIVE — AB
Protein, ur: NEGATIVE mg/dL
Specific Gravity, Urine: 1.015 (ref 1.005–1.030)
WBC, UA: >50 WBC/hpf (ref 0–5)
pH: 6.5 (ref 5.0–8.0)

## 2024-02-22 MED ORDER — CIPROFLOXACIN HCL 500 MG PO TABS: 500.0000 mg | ORAL_TABLET | Freq: Two times a day (BID) | ORAL | 0 refills | Status: AC

## 2024-02-22 NOTE — Progress Notes (Signed)
   02/22/2024 10:03 AM   Valerie [name] Jan 02, 1951 [id number]  Reason for visit: Dysuria, foul-smelling urine, nausea/abdominal pain, history of recurrent UTI  History: We have followed extensively for history of microscopic hematuria, recurrent UTI/asymptomatic bacteriuria Has chronic abdominal pain and nausea, these have not improved with antibiotics for UTI previously Previously was on estrogen cream and cranberry tablet prophylaxis, no longer taking Normal cystoscopy 2022  Physical Exam: BP (!) 147/79 (BP Location: Left Arm, Patient Position: Sitting, Cuff Size: Normal)   Pulse 66   Wt 116 lb (52.6 kg)   SpO2 98%   BMI 19.91 kg/m   Imaging/labs: I personally viewed and interpreted the CT scan from July 2025 showing no urologic abnormalities Urinalysis today greater than 50 WBC, 11-20 RBC, many bacteria, large leukocytes, nitrite positive.  Will send for culture and atypicals.  Today: Reports ongoing chronic abdominal pain, malaise, nausea 2 weeks of dysuria and foul-smelling urine Denies any fevers, chills, or flank pain  Plan:   UTI: 2 weeks of symptoms, urinalysis concerning for infection.  I recommended a course of antibiotics, specifically Cipro  based on prior culture data.  We also discussed at length that her nausea and abdominal pain are unlikely to be related to her UTI.  We reviewed prevention strategies again including cranberry tablet prophylaxis or resuming topical estrogen cream, could also consider trial of low-dose nitrofurantoin  in the future Cipro  500 mg twice daily x 5 days, follow-up cultures, RTC PA 3 months   Redell JAYSON Burnet, MD  [facility] [address], [address] [place], [place] [address] [phone]

## 2024-02-24 LAB — URINE CULTURE: Culture: >=100000 — AB

## 2024-02-25 ENCOUNTER — Ambulatory Visit: Payer: Self-pay | Admitting: Urology

## 2024-03-04 ENCOUNTER — Encounter: Payer: Self-pay | Admitting: Pharmacist

## 2024-03-04 LAB — MISC LABCORP TEST (SEND OUT): Labcorp test code: 86884

## 2024-03-04 NOTE — Progress Notes (Signed)
 Pharmacy Quality Measure Review  This patient is appearing on a report for being at risk of failing the adherence measure for hypertension (ACEi/ARB) medications this calendar year.   Medication: valsartan  160 mg Last fill date: 09/03/23 for 90 day supply  Cardiology ([facility]) removed valsartan  from med list at May encounter. No further action needed at this time.

## 2024-03-05 ENCOUNTER — Other Ambulatory Visit: Payer: Self-pay | Admitting: Physician Assistant

## 2024-03-05 ENCOUNTER — Other Ambulatory Visit: Payer: Self-pay | Admitting: Primary Care

## 2024-03-05 DIAGNOSIS — I10 Essential (primary) hypertension: Secondary | ICD-10-CM

## 2024-03-05 DIAGNOSIS — R110 Nausea: Secondary | ICD-10-CM

## 2024-03-25 ENCOUNTER — Other Ambulatory Visit: Payer: Self-pay | Admitting: Gastroenterology

## 2024-03-25 ENCOUNTER — Encounter: Payer: Self-pay | Admitting: Gastroenterology

## 2024-03-25 ENCOUNTER — Ambulatory Visit: Admitting: Gastroenterology

## 2024-03-25 VITALS — BP 118/66 | HR 90 | Ht 63.0 in | Wt 117.2 lb

## 2024-03-25 DIAGNOSIS — K581 Irritable bowel syndrome with constipation: Secondary | ICD-10-CM | POA: Diagnosis not present

## 2024-03-25 DIAGNOSIS — R1084 Generalized abdominal pain: Secondary | ICD-10-CM | POA: Diagnosis not present

## 2024-03-25 DIAGNOSIS — R109 Unspecified abdominal pain: Secondary | ICD-10-CM | POA: Diagnosis not present

## 2024-03-25 DIAGNOSIS — R110 Nausea: Secondary | ICD-10-CM

## 2024-03-25 MED ORDER — AMITRIPTYLINE HCL 10 MG PO TABS: 10.0000 mg | ORAL_TABLET | Freq: Every day | ORAL | 3 refills | Status: AC

## 2024-03-25 MED ORDER — PRUCALOPRIDE SUCCINATE 2 MG PO TABS: 2.0000 mg | ORAL_TABLET | Freq: Every day | ORAL | 3 refills | Status: DC

## 2024-03-25 NOTE — Progress Notes (Signed)
 Chief Complaint:    Nausea, abdominal pain, increased gas, BRBPR  GI History: 73  year old female patient with past medical history of hyperlipidemia, hypertension, anxiety,GERD, constipation with history of partial small bowel obstruction, alpha gal and milk allergy , cholecystectomy 1997, choledocholithiasis status post ERCP with sphincterotomy, follows in the GI clinic for the following:  1) IBS-C.  Longstanding history of chronic constipation.  Has trialed multiple agents over the years to include MiraLAX, Senokot, Linzess , Amitizia 24mcg BID, Trulance, and Ibsrela .  Has used dicyclomine  for abdominal cramping.  2) Chronic nausea.  Chronic nausea without emesis independent of p.o. intake.  Sxs have been present for many years. No improvement with trial of ondansetron .  Negative workup as below.  3) GERD.  Currently treated with omeprazole  40 mg daily along with sucralfate  on demand (she takes more so for the abdominal pain).  Previously followed with [facility] for many years for the same symptoms.  Extensive Negative GI Workup:  01/25/23 MRI abd/MRCP for N/V, abd pain status post chole Status post cholecystectomy. Mild postoperative biliary ductaldilatation. No acute findings of the abdomen to explain pain. -Gastric emptying study 11/17/2022: Normal. -EGD 06/2022 by Dr. Therisa was Normal.  Gastric biopsies negative for H. pylori.  -Abdominal MRI / MRCP 08/2022 showed sludge in the bile duct post cholecystectomy.   -ERCP & EUS by Dr. Wilhelmenia 09/14/2022 showed 1 cm hiatal hernia, mild gastritis, and sludge / debri removed from biliary and pancreatic duct..  Normal pancreas.  -CT abdomen pelvis with contrast 09/15/2022 showed mild hyperdensity in the distal common bile duct, likely reflecting sludge post recent ERCP.  Otherwise no acute abnormality.  No masses.  No bowel inflammation.  Previous hysterectomy, appendectomy, and cholecystectomy.  -Labs 11/28/2022 showed normal CBC, CMP, and lipase.   Normal LFTs. -05/17/21 EGD with Dr. Keene Curl- for nausea, vomiting, epi pain  normal esophagus. Erythematous mucosa in the antrume. Biopsied. Normal duodenal bulb, second portion of the duodenum and examined duodenum. Biopsies were obtained in the gastric body, at the incisura and in the gastric antrum. -Colonoscopy 07/2020 normal.  Excellent prep.  Small internal hemorrhoids.  Repeat in 10 years. -24 hour pH manometry test in 2017 on PPI which was normal, suggesting functional heartburn - no pathologic acid reflux. -MRI brain (11/2021) with no evidence of acute intracranial abnormality.     HPI:     Patient is a 73 y.o. female presenting to the Gastroenterology Clinic for evaluation of multiple symptoms, to include nausea, RUQ pain, increased gas production, and BRBPR.  Was last seen in the GI clinic by [facility] on 11/06/2023.  Stopped Amitiza  to try to see if that would improve her nausea and started on Linzess  with consideration for Motegrity depending on response.  Provided Compazine  prn for nausea along with low residue diet.  She has a longstanding history of chronic abdominal pain, nausea, constipation with extensive evaluation as above.  -12/03/2023: CT A/P (right-sided abdominal pain, nausea): No acute intra-abdominal pathology - Reviewed most recent labs from July: Normal CBC, CMP.  Lipase 55 - Diagnosed with UTI on 02/22/2024 (E. coli)  Today, RUQ pain again for the last 6 weeks. Same pain that she has had for years.  No better or worse.  No change in constipation with Linzess  so she stopped and has started OTC dulcolax with improvement, but can have frequent and loose stools when she uses.  Episodic scant BRB on tissue paper attributed to known small internal hemorrhoids and constipation.  Not  new.  No change in nausea with stopping Amitiza , and no improvement with Zofran  or Compazine .  Tried ginger tea without change.    Review of systems:     No chest pain, no SOB, no  fevers, no urinary sx   Past Medical History:  Diagnosis Date   Acid reflux    Acute cystitis with hematuria 12/08/2020   Acute cystitis without hematuria 01/10/2023   Adenocarcinoma of the endometrium/uterus (HCC) 12/04/2000   Anal fissure    Anxiety    Atrophic vaginitis    COVID-19 virus infection 09/28/2020   Dehydration    Difficult intubation    limited neck mobility   Dysuria 08/13/2020   Esophagitis    Hernia    Present to proximal to umbillical    History of hiatal hernia    Hyperlipidemia    Hypertension    Hypokalemia 09/15/2022   Internal hemorrhoids    Intractable abdominal pain 09/15/2022   Lactic acidosis    Menorrhagia    Osteopenia    Partial small bowel obstruction (HCC) 01/26/2021   PONV (postoperative nausea and vomiting)    Recurrent UTI    Vertigo    Vitamin D  deficiency     Patient's surgical history, family medical history, social history, medications and allergies were all reviewed in Epic    Current Outpatient Medications  Medication Sig Dispense Refill   acetaminophen  (TYLENOL ) 325 MG tablet Take 325 mg by mouth 2 (two) times daily as needed (pain or headaches).     amLODipine  (NORVASC ) 5 MG tablet TAKE 1 TABLET EVERY DAY FOR BLOOD PRESSURE 90 tablet 1   atorvastatin  (LIPITOR) 10 MG tablet TAKE 1 TABLET DAILY FOR CHOLESTEROL 90 tablet 2   azelastine  (ASTELIN ) 0.1 % nasal spray Place 1 spray into both nostrils 2 (two) times daily. Use in each nostril as directed 30 mL 5   CALCIUM -VITAMIN D  PO Take 2 tablets by mouth daily.     cetirizine  (ZYRTEC  ALLERGY ) 10 MG tablet Take 1 tablet (10 mg total) by mouth daily. 30 tablet 5   ciprofloxacin  (CIPRO ) 500 MG tablet Take 1 tablet (500 mg total) by mouth every 12 (twelve) hours. 10 tablet 0   cromolyn (GASTROCROM) 100 MG/5ML solution Take 200 mg by mouth 4 (four) times daily -  before meals and at bedtime.     cyclobenzaprine  (FLEXERIL ) 5 MG tablet Take 1 tablet (5 mg total) by mouth at bedtime as  needed for muscle spasms. 15 tablet 0   dicyclomine  (BENTYL ) 10 MG capsule TAKE 1 CAPSULE THREE TIMES DAILY BEFORE MEALS 270 capsule 3   EPINEPHrine  0.3 mg/0.3 mL IJ SOAJ injection Inject 0.3 mg into the muscle as directed. 2 each 1   fluticasone  (FLONASE ) 50 MCG/ACT nasal spray USE 2 SPRAYS IN EACH NOSTRIL EVERY DAY AS NEEDED FOR ALLERGIES OR RHINITIS 48 g 2   lubiprostone  (AMITIZA ) 24 MCG capsule Take 1 capsule (24 mcg total) by mouth 2 (two) times daily with a meal. 180 capsule 3   metoprolol  succinate (TOPROL -XL) 50 MG 24 hr tablet Take 25 mg by mouth as needed.     mupirocin  ointment (BACTROBAN ) 2 % Apply 1 Application topically 2 (two) times daily. 22 g 0   omeprazole  (PRILOSEC) 40 MG capsule Take 1 capsule (40 mg total) by mouth 2 (two) times daily. TAKE 1 CAPSULE TWICE DAILY FOR HEARTBURN 180 capsule 3   ondansetron  (ZOFRAN -ODT) 8 MG disintegrating tablet Take 1 tablet (8 mg total) by mouth 3 (three) times daily  as needed. 90 tablet 11   prochlorperazine  (COMPAZINE ) 5 MG tablet Take 1 tablet (5 mg total) by mouth every 12 (twelve) hours as needed for nausea or vomiting. 30 tablet 0   sucralfate  (CARAFATE ) 1 g tablet TAKE 1 TABLET THREE TIMES DAILY BEFORE MEALS 90 tablet 0   topiramate  (TOPAMAX ) 50 MG tablet Take 1 tablet (50 mg total) by mouth daily. For headache prevention 90 tablet 2   traMADol  (ULTRAM ) 50 MG tablet Take 50 mg by mouth 3 (three) times daily as needed.     triamcinolone  (KENALOG ) 0.1 % paste Use as directed 1 Application in the mouth or throat 2 (two) times daily. 5 g 0   valACYclovir  (VALTREX ) 1000 MG tablet Take 2 tablets twice daily for 1 day. 4 tablet 0   valsartan  (DIOVAN ) 160 MG tablet Take 160 mg by mouth daily.     No current facility-administered medications for this visit.    Physical Exam:     BP 118/66   Pulse 90   Ht 5' 3 (1.6 m)   Wt 117 lb 4 oz (53.2 kg)   BMI 20.77 kg/m   GENERAL:  Pleasant female in NAD PSYCH: : Cooperative, normal  affect NEURO: Alert and oriented x 3, no focal neurologic deficits   IMPRESSION and PLAN:    1) IBS-C 2) Generalized abdominal pain 3) Abdominal cramping Has had an extensive evaluation to include CT abdomen/pelvis x 8 over the last 3 years for her abdominal pain, along with colonoscopy in 2022 and MRI/MRCP.  Discussed diagnosis of IBS-C at length with plan for the following:  - Trial Elavil  10 mg at bedtime and can uptitrate pending response.  Can message me after about 2 weeks and can increase dose if suboptimal response - Trial Motegrity 2 mg daily - Adequate hydration with at least 64 ounces of water daily - May benefit from adding IBgard  4) Chronic Nausea Has had an extensive evaluation to include multiple EGDs, GES, ERCP/EUS, CT head, MRI brain, multiple imaging studies as above.  No appreciable response to trial of Zofran , Compazine .  Did discuss the possibility of nausea being secondary to a non-GI etiology, polypharmacy, etc. - Will evaluate for improvement in nausea when starting the Motegrity as above - Low residue diet - May benefit from adding FD guard    RTC in 6-12 months or sooner as needed  I spent 35 minutes of time, including in depth chart review, independent review of results as outlined above, communicating results with the patient directly, face-to-face time with the patient, coordinating care, and ordering studies and medications as appropriate, and documentation.          Sandor LULLA Flatter ,DO, FACG 03/25/2024, 9:46 AM

## 2024-03-25 NOTE — Patient Instructions (Signed)
 _______________________________________________________  If your blood pressure at your visit was 140/90 or greater, please contact your primary care physician to follow up on this.  _______________________________________________________  If you are age 71 or older, your body mass index should be between 23-30. Your Body mass index is 20.77 kg/m. If this is out of the aforementioned range listed, please consider follow up with your Primary Care Provider.  If you are age 67 or younger, your body mass index should be between 19-25. Your Body mass index is 20.77 kg/m. If this is out of the aformentioned range listed, please consider follow up with your Primary Care Provider.   ________________________________________________________  The [facility] providers would like to encourage you to use MYCHART to communicate with providers for non-urgent requests or questions.  Due to long hold times on the telephone, sending your provider a message by [facility] may be a faster and more efficient way to get a response.  Please allow 48 business hours for a response.  Please remember that this is for non-urgent requests.  _______________________________________________________  [facility] is using a team-based approach to care.  Your team is made up of your doctor and two to three APPS. Our APPS (Nurse Practitioners and Physician Assistants) work with your physician to ensure care continuity for you. They are fully qualified to address your health concerns and develop a treatment plan. They communicate directly with your gastroenterologist to care for you. Seeing the Advanced Practice Practitioners on your physician's team can help you by facilitating care more promptly, often allowing for earlier appointments, access to diagnostic testing, procedures, and other specialty referrals.   We have sent the following medications to your pharmacy for you to pick up at your convenience:  START: Motegrity  2mg  once daily  START: Elavil  10mg  every night at bedtime.  It was a pleasure to see you today!  Vito Cirigliano, D.O.

## 2024-03-29 ENCOUNTER — Other Ambulatory Visit: Payer: Self-pay | Admitting: Gastroenterology

## 2024-03-29 DIAGNOSIS — R110 Nausea: Secondary | ICD-10-CM

## 2024-04-03 ENCOUNTER — Ambulatory Visit: Payer: Self-pay | Admitting: *Deleted

## 2024-04-03 ENCOUNTER — Other Ambulatory Visit: Payer: Self-pay | Admitting: Primary Care

## 2024-04-03 NOTE — Telephone Encounter (Signed)
 Please call patient:  It looks like she is prescribed both valsartan  and metoprolol  from cardiology.  I reviewed her cardiology visit from May 2025 and they advised that she stop taking hydrochlorothiazide  for BP and to continue valsartan  and metoprolol .  I advise that she call her cardiologist's office to clarify her current regimen.  I agree that she needs blood pressure treatment.  Have her call us  back if she cannot get through.  Also, has she tried to refill her prescription at the pharmacy?

## 2024-04-03 NOTE — Telephone Encounter (Unsigned)
 Copied from CRM #[id number]. Topic: Clinical - Medication Refill >> Apr 03, 2024  4:35 PM Sasha M wrote: Medication: valsartan  (DIOVAN ) 160 MG tablet  Has the patient contacted their pharmacy? No (Agent: If no, request that the patient contact the pharmacy for the refill. If patient does not wish to contact the pharmacy document the reason why and proceed with request.) (Agent: If yes, when and what did the pharmacy advise?)  This is the patient's preferred pharmacy:  [facility] [address] [place] - [address] [place] [address] Phone: [phone] Fax: [phone]   Is this the correct pharmacy for this prescription? Yes If no, delete pharmacy and type the correct one.   Has the prescription been filled recently? No  Is the patient out of the medication? Yes  Has the patient been seen for an appointment in the last year OR does the patient have an upcoming appointment? Yes  Can we respond through MyChart? No  Agent: Please be advised that Rx refills may take up to 3 business days. We ask that you follow-up with your pharmacy.

## 2024-04-03 NOTE — Telephone Encounter (Signed)
 Reason for Disposition . Ran out of BP medications    Patient wants to add back a BP medication listed as historical  Protocols used: Blood Pressure - High-A-AH

## 2024-04-03 NOTE — Telephone Encounter (Signed)
 FYI Only or Action Required?: Action required by provider: clinical question for provider.  Patient was last seen in primary care on 11/21/2023 by Gretta Comer POUR, NP.  Called Nurse Triage reporting Hypertension.  Symptoms began a week ago.  Interventions attempted: Nothing.  Symptoms are: unchanged.  Triage Disposition: No disposition on file.  Patient/caregiver understands and will follow disposition?: Patient would like call back today- she wants to take medication on hand- but does not have order.   Copied from CRM #[id number]. Topic: Clinical - Red Word Triage >> Apr 03, 2024 11:36 AM Amy B wrote: Red Word that prompted transfer to Nurse Triage: High blood pressure with headache Answer Assessment - Initial Assessment Questions 1. BLOOD PRESSURE: What is your blood pressure? Did you take at least two measurements 5 minutes apart?     145/91, 125, noticing rise for about a week, 154/98 HR 133 2. ONSET: When did you take your blood pressure?     11:45 3. HOW: How did you take your blood pressure? (e.g., automatic home BP monitor, visiting nurse)     Automatic cuff- arm 4. HISTORY: Do you have a history of high blood pressure?     yes 5. MEDICINES: Are you taking any medicines for blood pressure? Have you missed any doses recently?     Yes, no missed dosing 6. OTHER SYMPTOMS: Do you have any symptoms? (e.g., blurred vision, chest pain, difficulty breathing, headache, weakness)     Headache  Patient states she was advised to stop valsartan  (DIOVAN ) 160 MG tablet about 1 years ago- she thinks she needs to restart it and would like response from provider ASAP- she is concerned about her climbing numbers. Patient advised I would send message high priority for response.  Protocols used: Blood Pressure - High-A-AH

## 2024-04-03 NOTE — Telephone Encounter (Signed)
Spoke with pt relaying Kate's message.  Pt verbalizes understanding.

## 2024-04-04 DIAGNOSIS — I700 Atherosclerosis of aorta: Secondary | ICD-10-CM | POA: Diagnosis not present

## 2024-04-04 DIAGNOSIS — Z7982 Long term (current) use of aspirin: Secondary | ICD-10-CM | POA: Diagnosis not present

## 2024-04-04 DIAGNOSIS — R002 Palpitations: Secondary | ICD-10-CM | POA: Diagnosis not present

## 2024-04-04 DIAGNOSIS — I10 Essential (primary) hypertension: Secondary | ICD-10-CM | POA: Diagnosis not present

## 2024-04-04 DIAGNOSIS — E785 Hyperlipidemia, unspecified: Secondary | ICD-10-CM | POA: Diagnosis not present

## 2024-04-04 NOTE — Telephone Encounter (Signed)
 Can we follow up to see if she contacted her cardiologist's office regarding the valsartan  and metoprolol  for BP and HR?

## 2024-04-04 NOTE — Telephone Encounter (Signed)
 Patient returned call to [facility].Patient stated tat she went to see her cardiologist today and he gave her prescriptions for these medications.

## 2024-04-04 NOTE — Telephone Encounter (Signed)
 Lvm asking pt to call back. Pls get answer to Kate's question?

## 2024-04-14 ENCOUNTER — Ambulatory Visit: Payer: Self-pay

## 2024-04-14 NOTE — Telephone Encounter (Signed)
 Patient called, left VM to return the call to the office to speak to NT. Unable to reach patient after 2 attempts by E2C2 NT, routing to the provider for resolution.     Copied from CRM #[id number]. Topic: Clinical - Red Word Triage >> Apr 14, 2024 10:16 AM Valerie [name] wrote: Red Word that prompted transfer to Nurse Triage:  She has been nauseated; right side lower back pain; pain level at a 10; hard to lay on right side at night. It is getting worse.

## 2024-04-14 NOTE — Telephone Encounter (Signed)
 This RN reached out to triage pt. Call went straight to VM. Left message to call back.   Copied from CRM #[id number]. Topic: Clinical - Red Word Triage >> Apr 14, 2024 10:16 AM Montie POUR wrote: Red Word that prompted transfer to Nurse Triage:  She has been nauseated; right side lower back pain; pain level at a 10; hard to lay on right side at night. It is getting worse. >> Apr 14, 2024 10:32 AM Farrel B wrote: I went back to advise the patient that I was still waiting on hold for a nurse she had disconnected call.  >> Apr 14, 2024 10:25 AM Farrel B wrote: Patient is returning call she stated the nurse had called her and she missed the call

## 2024-04-15 ENCOUNTER — Encounter: Payer: Self-pay | Admitting: Urology

## 2024-04-16 NOTE — Telephone Encounter (Signed)
 Noted.  We are happy to see her when she calls us  back.  She does have a history of chronic nausea and follows with GI.

## 2024-04-20 ENCOUNTER — Other Ambulatory Visit: Payer: Self-pay | Admitting: Physician Assistant

## 2024-04-20 DIAGNOSIS — R1084 Generalized abdominal pain: Secondary | ICD-10-CM

## 2024-05-12 NOTE — Progress Notes (Deleted)
 05/19/2024 11:23 AM   Valerie [name] 09-Jul-1950 [id number]  Referring provider: Gretta Comer MARLA, NP [address] [place],  [place] [address]  Urological history: 1.  High risk hematuria - Non-smoker - cysto (2022) NED - urine cytology (2022) negative - contrast CT (11/2023) NED   2. rUTI's  - February 22, 2024, E. Coli - December 03, 2023, multiple species - June 11, 2023, ESBL E. Coli  CC: recurrent UTI's  HPI: Valerie [name] is a 73 y.o. woman who presents today for three month follow up.   Previous records reviewed.  They are having (1 to 7) or (8 or more) daytime voids,  they are having nocturia (1-2) or (3 or more) and urgency is (none, mild, strong, severe).   They are having (stress, urge or mixed incontinence.)    they are having urinary leakage (1-2 times weekly, 3 or more times weekly, 1-2 times daily and 3 or more times daily) They are using absorbent products for leakage (no, sometimes, always )   the type of products they use are (panty liners, absorbant pads, depends) *** daily.  They are not limiting fluids.  They are not engaging in toilet mapping  ***  UA ***  PVR ***  Serum creatinine (11/2023) 1.02   PMH: Past Medical History:  Diagnosis Date   Acid reflux    Acute cystitis with hematuria 12/08/2020   Acute cystitis without hematuria 01/10/2023   Adenocarcinoma of the endometrium/uterus (HCC) 12/04/2000   Anal fissure    Anxiety    Atrophic vaginitis    COVID-19 virus infection 09/28/2020   Dehydration    Difficult intubation    limited neck mobility   Dysuria 08/13/2020   Esophagitis    Hernia    Present to proximal to umbillical    History of hiatal hernia    Hyperlipidemia    Hypertension    Hypokalemia 09/15/2022   Internal hemorrhoids    Intractable abdominal pain 09/15/2022   Lactic acidosis    Menorrhagia    Osteopenia    Partial small bowel obstruction (HCC) 01/26/2021   PONV (postoperative nausea and vomiting)     Recurrent UTI    Vertigo    Vitamin D  deficiency     Surgical History: Past Surgical History:  Procedure Laterality Date   62 HOUR PH STUDY N/A 11/29/2015   Procedure: 24 HOUR PH STUDY;  Surgeon: Elspeth Deward Naval, MD;  Location: [facility];  Service: Gastroenterology;  Laterality: N/A;   ABDOMINAL HYSTERECTOMY  12/04/2000   also bilateral salpingo-oophorectomy   CARDIAC CATHETERIZATION  2006   CARPAL TUNNEL RELEASE  11/20/2011   Procedure: CARPAL TUNNEL RELEASE;  Surgeon: Arley SHAUNNA Helling, MD;  Location: [facility];  Service: Neurosurgery;  Laterality: Left;  LEFT carpal tunnel release   CERVICAL SPINE SURGERY  2013   [facility]   CHOLECYSTECTOMY  1997   COLONOSCOPY WITH PROPOFOL  N/A 07/27/2020   Procedure: COLONOSCOPY WITH PROPOFOL ;  Surgeon: Jinny Carmine, MD;  Location: [facility];  Service: Endoscopy;  Laterality: N/A;   DILATION AND CURETTAGE OF UTERUS     11/15/2000   ENDOSCOPIC RETROGRADE CHOLANGIOPANCREATOGRAPHY (ERCP) WITH PROPOFOL  N/A 09/14/2022   Procedure: ENDOSCOPIC RETROGRADE CHOLANGIOPANCREATOGRAPHY (ERCP) WITH PROPOFOL ;  Surgeon: Wilhelmenia Aloha Raddle., MD;  Location: [facility];  Service: Gastroenterology;  Laterality: N/A;   ESOPHAGEAL MANOMETRY N/A 09/20/2015   Procedure: ESOPHAGEAL MANOMETRY (EM);  Surgeon: Elspeth Deward Naval, MD;  Location: [facility];  Service: Gastroenterology;  Laterality: N/A;  ESOPHAGOGASTRODUODENOSCOPY (EGD) WITH PROPOFOL  N/A 05/17/2021   Procedure: ESOPHAGOGASTRODUODENOSCOPY (EGD) WITH PROPOFOL ;  Surgeon: Janalyn Keene NOVAK, MD;  Location: [facility];  Service: Endoscopy;  Laterality: N/A;   ESOPHAGOGASTRODUODENOSCOPY (EGD) WITH PROPOFOL  N/A 07/17/2022   Procedure: ESOPHAGOGASTRODUODENOSCOPY (EGD) WITH PROPOFOL ;  Surgeon: Therisa Bi, MD;  Location: [facility];  Service: Gastroenterology;  Laterality: N/A;   ESOPHAGOGASTRODUODENOSCOPY (EGD) WITH PROPOFOL  N/A 09/14/2022   Procedure: ESOPHAGOGASTRODUODENOSCOPY (EGD) WITH PROPOFOL ;   Surgeon: Wilhelmenia Aloha Raddle., MD;  Location: [facility];  Service: Gastroenterology;  Laterality: N/A;   EUS N/A 09/14/2022   Procedure: UPPER ENDOSCOPIC ULTRASOUND (EUS) RADIAL;  Surgeon: Wilhelmenia Aloha Raddle., MD;  Location: [facility];  Service: Gastroenterology;  Laterality: N/A;   HYSTEROSCOPY  11/15/2000   D & C, resection of endometrial polyps   LUMBAR SPINAL CORD SIMULATOR LEAD REMOVAL N/A 08/01/2021   Procedure: REMOVAL OF SPINAL CORD STIMULATOR;  Surgeon: Clois Fret, MD;  Location: [facility];  Service: Neurosurgery;  Laterality: N/A;   OOPHORECTOMY  12/04/2000   bilateral salpingo-oophorectomy done with TAH   REMOVAL OF STONES  09/14/2022   Procedure: REMOVAL OF STONES;  Surgeon: Wilhelmenia Aloha Raddle., MD;  Location: [facility];  Service: Gastroenterology;;   ANNETT  09/14/2022   Procedure: ANNETT;  Surgeon: Mansouraty, Aloha Raddle., MD;  Location: [facility];  Service: Gastroenterology;;   SPINAL CORD STIMULATOR INSERTION Bilateral 04/12/2020   Procedure: INSERTION CERVICAL SPINAL STIMULATOR PULSE GENERATOR;  Surgeon: Bluford Standing, MD;  Location: [facility];  Service: Neurosurgery;  Laterality: Bilateral;   SPINAL CORD STIMULATOR TRIAL N/A 03/22/2020   Procedure: CERVICAL SPINAL CORD STIMULATOR TRIAL PERCUTANEOUS;  Surgeon: Bluford Standing, MD;  Location: [facility];  Service: Neurosurgery;  Laterality: N/A;   TUBAL LIGATION      Home Medications:  Allergies as of 05/19/2024       Reactions   Alpha-gal Anaphylaxis, Hives, Other (See Comments)   Alpha-gal Syndrome (AGS)   Milk (cow) Anaphylaxis   Alpha-gal Syndrome (AGS)   Milk-related Compounds Anaphylaxis, Other (See Comments)   Alpha-gal Syndrome (AGS)   Other Anaphylaxis, Other (See Comments)   NO ANIMAL-DERIVED PRODUCTS!! Alpha-gal Syndrome (AGS)   Bactrim  [sulfamethoxazole -trimethoprim ] Other (See Comments)   Mouth ulcers and sores        Medication List        Accurate as of  May 12, 2024 11:23 AM. If you have any questions, ask your nurse or doctor.          acetaminophen  325 MG tablet Commonly known as: TYLENOL  Take 325 mg by mouth 2 (two) times daily as needed (pain or headaches).   amitriptyline  10 MG tablet Commonly known as: ELAVIL  Take 1 tablet (10 mg total) by mouth at bedtime.   amLODipine  5 MG tablet Commonly known as: NORVASC  TAKE 1 TABLET EVERY DAY FOR BLOOD PRESSURE   atorvastatin  10 MG tablet Commonly known as: LIPITOR TAKE 1 TABLET DAILY FOR CHOLESTEROL   azelastine  0.1 % nasal spray Commonly known as: ASTELIN  Place 1 spray into both nostrils 2 (two) times daily. Use in each nostril as directed   CALCIUM -VITAMIN D  PO Take 2 tablets by mouth daily.   cetirizine  10 MG tablet Commonly known as: ZyrTEC  Allergy  Take 1 tablet (10 mg total) by mouth daily.   ciprofloxacin  500 MG tablet Commonly known as: CIPRO  Take 1 tablet (500 mg total) by mouth every 12 (twelve) hours.   cyclobenzaprine  5 MG tablet Commonly known as: FLEXERIL  Take 1 tablet (5 mg total) by mouth at bedtime as needed  for muscle spasms.   dicyclomine  10 MG capsule Commonly known as: BENTYL  Take 1 capsule (10 mg total) by mouth 3 (three) times daily before meals.   EPINEPHrine  0.3 mg/0.3 mL Soaj injection Commonly known as: EPI-PEN Inject 0.3 mg into the muscle as directed.   fluticasone  50 MCG/ACT nasal spray Commonly known as: FLONASE  USE 2 SPRAYS IN EACH NOSTRIL EVERY DAY AS NEEDED FOR ALLERGIES OR RHINITIS   metoprolol  succinate 50 MG 24 hr tablet Commonly known as: TOPROL -XL Take 25 mg by mouth as needed.   mupirocin  ointment 2 % Commonly known as: BACTROBAN  Apply 1 Application topically 2 (two) times daily.   omeprazole  40 MG capsule Commonly known as: PRILOSEC Take 1 capsule (40 mg total) by mouth 2 (two) times daily. TAKE 1 CAPSULE TWICE DAILY FOR HEARTBURN   ondansetron  8 MG disintegrating tablet Commonly known as: ZOFRAN -ODT Take 1  tablet (8 mg total) by mouth 3 (three) times daily as needed.   prochlorperazine  5 MG tablet Commonly known as: COMPAZINE  Take 1 tablet (5 mg total) by mouth every 12 (twelve) hours as needed for nausea or vomiting.   Prucalopride Succinate  2 MG Tabs Take 1 tablet (2 mg total) by mouth daily.   sucralfate  1 g tablet Commonly known as: CARAFATE  TAKE 1 TABLET THREE TIMES DAILY BEFORE MEALS   topiramate  50 MG tablet Commonly known as: Topamax  Take 1 tablet (50 mg total) by mouth daily. For headache prevention   traMADol  50 MG tablet Commonly known as: ULTRAM  Take 50 mg by mouth 3 (three) times daily as needed.   triamcinolone  0.1 % paste Commonly known as: KENALOG  Use as directed 1 Application in the mouth or throat 2 (two) times daily.   valACYclovir  1000 MG tablet Commonly known as: Valtrex  Take 2 tablets twice daily for 1 day.   valsartan  160 MG tablet Commonly known as: DIOVAN  Take 160 mg by mouth daily.        Allergies: Allergies[1]  Family History: Family History  Problem Relation Age of Onset   Hypertension Father    Prostate cancer Father    Lung cancer Sister    Diabetes Sister    Diabetes Brother    Heart disease Brother    Hypertension Brother    Diabetes Brother    Heart disease Brother    Lung cancer Brother    Heart attack Brother    Hypertension Son    Other Son        spots on brain   Autoimmune disease Daughter    Breast cancer Neg Hx     Social History:  reports that she has never smoked. She has never been exposed to tobacco smoke. She has never used smokeless tobacco. She reports that she does not drink alcohol and does not use drugs.  ROS: Pertinent ROS in HPI  Physical Exam: There were no vitals taken for this visit.  Constitutional:  Well nourished. Alert and oriented, No acute distress. HEENT: [place] AT, moist mucus membranes.  Trachea midline, no masses. Cardiovascular: No clubbing, cyanosis, or edema. Respiratory: Normal  respiratory effort, no increased work of breathing. GU: No CVA tenderness.  No bladder fullness or masses.  Recession of labia minora, dry, pale vulvar vaginal mucosa and loss of mucosal ridges and folds.  Normal urethral meatus, no lesions, no prolapse, no discharge.   No urethral masses, tenderness and/or tenderness. No bladder fullness, tenderness or masses. *** vagina mucosa, *** estrogen effect, no discharge, no lesions, *** pelvic support, ***  cystocele and *** rectocele noted.  No cervical motion tenderness.  Uterus is freely mobile and non-fixed.  No adnexal/parametria masses or tenderness noted.  Anus and perineum are without rashes or lesions.   ***  Neurologic: Grossly intact, no focal deficits, moving all 4 extremities. Psychiatric: Normal mood and affect.    Laboratory Data: See Epic and HPI   I have reviewed the labs.   Pertinent Imaging: N/A  Assessment & Plan:  ***  1. rUTI's - criteria for recurrent UTI has been met with 2 or more infections in 6 months or 3 or greater infections in one year             - vaginal estrogen cream is first-line therapy for postmenopausal women ***  - patient is instructed to increase their water intake until the urine is pale yellow or clear (10 to 12 cups daily) ***  - taking probiotics that include  Lactobacillus crispatus in premenopausal women and oral capsules with Lactobacillus rhamnosus GR-1 and Lactobacillus reuteri RC-14 in postmenopausal women ***  - Hiprex 1 gram twice daily if creatinine clearance is > 30 ***  - could consider D-mannose or cranberry products, but evidence is weak with contradictory findings ***  - address constipation issues ***   * Cetaphil to clean the genital area ( not soap)  * Cranberry supplement (-Knudsen cranberry concentrate- 1 ounce mixed with 8 ounces of water *wash with water after bowel movt *Probiotic for vaginal health( can try Pearls vaginal health) * Increase water consumption- 8 glasses a  day *Avoid antibiotics unless systemic infection/ or before cystoscopy * Kegel Exercise to strengthen pelvic floor                                                    No follow-ups on file.  These notes generated with voice recognition software. I apologize for typographical errors.  CLOTILDA CORNWALL, PA-C  [facility] [address]  [address] [place], [place] [address] [phone]     [1]  Allergies Allergen Reactions   Alpha-Gal Anaphylaxis, Hives and Other (See Comments)    Alpha-gal Syndrome (AGS)   Milk (Cow) Anaphylaxis    Alpha-gal Syndrome (AGS)   Milk-Related Compounds Anaphylaxis and Other (See Comments)    Alpha-gal Syndrome (AGS)   Other Anaphylaxis and Other (See Comments)    NO ANIMAL-DERIVED PRODUCTS!! Alpha-gal Syndrome (AGS)   Bactrim  [Sulfamethoxazole -Trimethoprim ] Other (See Comments)    Mouth ulcers and sores

## 2024-05-19 ENCOUNTER — Ambulatory Visit: Admitting: Urology

## 2024-05-23 ENCOUNTER — Other Ambulatory Visit: Payer: Self-pay | Admitting: Family

## 2024-05-23 ENCOUNTER — Encounter: Payer: Self-pay | Admitting: Family

## 2024-05-23 ENCOUNTER — Ambulatory Visit: Admitting: Family

## 2024-05-23 VITALS — BP 124/70 | HR 90 | Temp 97.9°F | Ht 63.5 in | Wt 118.0 lb

## 2024-05-23 DIAGNOSIS — U071 COVID-19: Secondary | ICD-10-CM

## 2024-05-23 DIAGNOSIS — R051 Acute cough: Secondary | ICD-10-CM

## 2024-05-23 LAB — POCT INFLUENZA A/B
Influenza A, POC: NEGATIVE
Influenza B, POC: NEGATIVE

## 2024-05-23 LAB — POC COVID19 BINAXNOW: SARS Coronavirus 2 Ag: POSITIVE — AB

## 2024-05-23 MED ORDER — HYDROCODONE BIT-HOMATROP MBR 5-1.5 MG/5ML PO SOLN: 5.0000 mL | Freq: Three times a day (TID) | ORAL | 0 refills | Status: DC | PRN

## 2024-05-23 NOTE — Progress Notes (Signed)
 "  Acute Office Visit  Subjective:     Patient ID: Valerie [name], female    DOB: Sep 28, 1950, 73 y.o.   MRN: [id number]  Chief Complaint  Patient presents with   Cough    C/o cough, head/chest congestion, body aches and fatigue. Sxs started 05/20/24.    HPI Patient is in today  with complaints of cough, chest congestion, body aches, and fatigue x 3 days.  The worst of the symptoms is the cough that is productive with clear phlegm.  Denies any known sick contacts.  Taking Tylenol  that does help.  Review of Systems  Constitutional: Negative.   HENT:  Positive for congestion and sore throat.   Respiratory:  Positive for cough. Negative for shortness of breath and wheezing.   Cardiovascular: Negative.   Gastrointestinal:  Positive for heartburn.  Musculoskeletal: Negative.   Neurological: Negative.   Psychiatric/Behavioral: Negative.    All other systems reviewed and are negative.  Past Medical History:  Diagnosis Date   Acid reflux    Acute cystitis with hematuria 12/08/2020   Acute cystitis without hematuria 01/10/2023   Adenocarcinoma of the endometrium/uterus (HCC) 12/04/2000   Anal fissure    Anxiety    Atrophic vaginitis    COVID-19 virus infection 09/28/2020   Dehydration    Difficult intubation    limited neck mobility   Dysuria 08/13/2020   Esophagitis    Hernia    Present to proximal to umbillical    History of hiatal hernia    Hyperlipidemia    Hypertension    Hypokalemia 09/15/2022   Internal hemorrhoids    Intractable abdominal pain 09/15/2022   Lactic acidosis    Menorrhagia    Osteopenia    Partial small bowel obstruction (HCC) 01/26/2021   PONV (postoperative nausea and vomiting)    Recurrent UTI    Vertigo    Vitamin D  deficiency     Social History   Socioeconomic History   Marital status: Married    Spouse name: Not on file   Number of children: 2   Years of education: Not on file   Highest education level:  Not on file  Occupational History   Occupation: retired  Tobacco Use   Smoking status: Never    Passive exposure: Never   Smokeless tobacco: Never  Vaping Use   Vaping status: Never Used  Substance and Sexual Activity   Alcohol use: No   Drug use: Never   Sexual activity: Yes    Birth control/protection: Post-menopausal, Surgical  Other Topics Concern   Not on file  Social History Narrative   Married   Lives in [place]   Has 2 children, 4 grandchildren   Enjoys walking, shopping.   Social Drivers of Health   Tobacco Use: Low Risk (05/23/2024)   Patient History    Smoking Tobacco Use: Never    Smokeless Tobacco Use: Never    Passive Exposure: Never  Financial Resource Strain: Low Risk (09/13/2023)   Overall Financial Resource Strain (CARDIA)    Difficulty of Paying Living Expenses: Not hard at all  Food Insecurity: No Food Insecurity (09/13/2023)   Hunger Vital Sign    Worried About Running Out of Food in the Last Year: Never true    Ran Out of Food in the Last Year: Never true  Transportation Needs: No Transportation Needs (09/13/2023)   PRAPARE - Administrator, Civil Service (Medical): No    Lack of Transportation (Non-Medical): No  Physical Activity:  Insufficiently Active (09/13/2023)   Exercise Vital Sign    Days of Exercise per Week: 3 days    Minutes of Exercise per Session: 30 min  Stress: No Stress Concern Present (09/13/2023)   [employer] of Occupational Health - Occupational Stress Questionnaire    Feeling of Stress : Only a little  Social Connections: Moderately Integrated (09/13/2023)   Social Connection and Isolation Panel    Frequency of Communication with Friends and Family: More than three times a week    Frequency of Social Gatherings with Friends and Family: More than three times a week    Attends Religious Services: More than 4 times per year    Active Member of [employer] or Organizations: No    Attends Tax Inspector Meetings: Never    Marital Status: Married  Catering Manager Violence: Not At Risk (09/13/2023)   Humiliation, Afraid, Rape, and Kick questionnaire    Fear of Current or Ex-Partner: No    Emotionally Abused: No    Physically Abused: No    Sexually Abused: No  Depression (PHQ2-9): Low Risk (11/21/2023)   Depression (PHQ2-9)    PHQ-2 Score: 0  Alcohol Screen: Low Risk (09/13/2023)   Alcohol Screen    Last Alcohol Screening Score (AUDIT): 0  Housing: Unknown (10/15/2023)   Received from [facility]   Epic    Unable to Pay for Housing in the Last Year: Not on file    Number of Times Moved in the Last Year: Not on file    At any time in the past 12 months, were you homeless or living in a shelter (including now)?: No  Utilities: Not At Risk (09/13/2023)   [facility] Utilities    Threatened with loss of utilities: No  Health Literacy: Adequate Health Literacy (09/13/2023)   B1300 Health Literacy    Frequency of need for help with medical instructions: Never    Past Surgical History:  Procedure Laterality Date   26 HOUR PH STUDY N/A 11/29/2015   Procedure: 24 HOUR PH STUDY;  Surgeon: Elspeth Deward Naval, MD;  Location: [facility];  Service: Gastroenterology;  Laterality: N/A;   ABDOMINAL HYSTERECTOMY  12/04/2000   also bilateral salpingo-oophorectomy   CARDIAC CATHETERIZATION  2006   CARPAL TUNNEL RELEASE  11/20/2011   Procedure: CARPAL TUNNEL RELEASE;  Surgeon: Arley SHAUNNA Helling, MD;  Location: [facility];  Service: Neurosurgery;  Laterality: Left;  LEFT carpal tunnel release   CERVICAL SPINE SURGERY  2013   [facility]   CHOLECYSTECTOMY  1997   COLONOSCOPY WITH PROPOFOL  N/A 07/27/2020   Procedure: COLONOSCOPY WITH PROPOFOL ;  Surgeon: Jinny Carmine, MD;  Location: [facility];  Service: Endoscopy;  Laterality: N/A;   DILATION AND CURETTAGE OF UTERUS     11/15/2000   ENDOSCOPIC RETROGRADE CHOLANGIOPANCREATOGRAPHY (ERCP) WITH PROPOFOL  N/A 09/14/2022    Procedure: ENDOSCOPIC RETROGRADE CHOLANGIOPANCREATOGRAPHY (ERCP) WITH PROPOFOL ;  Surgeon: Wilhelmenia Aloha Raddle., MD;  Location: [facility];  Service: Gastroenterology;  Laterality: N/A;   ESOPHAGEAL MANOMETRY N/A 09/20/2015   Procedure: ESOPHAGEAL MANOMETRY (EM);  Surgeon: Elspeth Deward Naval, MD;  Location: [facility];  Service: Gastroenterology;  Laterality: N/A;   ESOPHAGOGASTRODUODENOSCOPY (EGD) WITH PROPOFOL  N/A 05/17/2021   Procedure: ESOPHAGOGASTRODUODENOSCOPY (EGD) WITH PROPOFOL ;  Surgeon: Janalyn Keene NOVAK, MD;  Location: [facility];  Service: Endoscopy;  Laterality: N/A;   ESOPHAGOGASTRODUODENOSCOPY (EGD) WITH PROPOFOL  N/A 07/17/2022   Procedure: ESOPHAGOGASTRODUODENOSCOPY (EGD) WITH PROPOFOL ;  Surgeon: Therisa Bi, MD;  Location: [facility];  Service: Gastroenterology;  Laterality: N/A;  ESOPHAGOGASTRODUODENOSCOPY (EGD) WITH PROPOFOL  N/A 09/14/2022   Procedure: ESOPHAGOGASTRODUODENOSCOPY (EGD) WITH PROPOFOL ;  Surgeon: Wilhelmenia Aloha Raddle., MD;  Location: [facility];  Service: Gastroenterology;  Laterality: N/A;   EUS N/A 09/14/2022   Procedure: UPPER ENDOSCOPIC ULTRASOUND (EUS) RADIAL;  Surgeon: Wilhelmenia Aloha Raddle., MD;  Location: [facility];  Service: Gastroenterology;  Laterality: N/A;   HYSTEROSCOPY  11/15/2000   D & C, resection of endometrial polyps   LUMBAR SPINAL CORD STIMULATOR LEAD REMOVAL N/A 08/01/2021   Procedure: REMOVAL OF SPINAL CORD STIMULATOR;  Surgeon: Clois Fret, MD;  Location: [facility];  Service: Neurosurgery;  Laterality: N/A;   OOPHORECTOMY  12/04/2000   bilateral salpingo-oophorectomy done with TAH   REMOVAL OF STONES  09/14/2022   Procedure: REMOVAL OF STONES;  Surgeon: Wilhelmenia Aloha Raddle., MD;  Location: [facility];  Service: Gastroenterology;;   ANNETT  09/14/2022   Procedure: ANNETT;  Surgeon: Mansouraty, Aloha Raddle., MD;  Location: [facility];  Service: Gastroenterology;;   SPINAL CORD  STIMULATOR INSERTION Bilateral 04/12/2020   Procedure: INSERTION CERVICAL SPINAL STIMULATOR PULSE GENERATOR;  Surgeon: Bluford Standing, MD;  Location: [facility];  Service: Neurosurgery;  Laterality: Bilateral;   SPINAL CORD STIMULATOR TRIAL N/A 03/22/2020   Procedure: CERVICAL SPINAL CORD STIMULATOR TRIAL PERCUTANEOUS;  Surgeon: Bluford Standing, MD;  Location: [facility];  Service: Neurosurgery;  Laterality: N/A;   TUBAL LIGATION      Family History  Problem Relation Age of Onset   Hypertension Father    Prostate cancer Father    Lung cancer Sister    Diabetes Sister    Diabetes Brother    Heart disease Brother    Hypertension Brother    Diabetes Brother    Heart disease Brother    Lung cancer Brother    Heart attack Brother    Hypertension Son    Other Son        spots on brain   Autoimmune disease Daughter    Breast cancer Neg Hx     Allergies[1]  Medications Ordered Prior to Encounter[2]  BP 124/70   Pulse 90   Temp 97.9 F (36.6 C) (Oral)   Ht 5' 3.5 (1.613 m)   Wt 118 lb (53.5 kg)   SpO2 95%   BMI 20.57 kg/m chart      Objective:    BP 124/70   Pulse 90   Temp 97.9 F (36.6 C) (Oral)   Ht 5' 3.5 (1.613 m)   Wt 118 lb (53.5 kg)   SpO2 95%   BMI 20.57 kg/m    Physical Exam Vitals and nursing note reviewed.  Constitutional:      Appearance: Normal appearance. She is normal weight.  HENT:     Right Ear: Tympanic membrane and external ear normal.     Left Ear: Tympanic membrane, ear canal and external ear normal.     Nose: Congestion present.     Mouth/Throat:     Pharynx: Posterior oropharyngeal erythema present. No oropharyngeal exudate.  Cardiovascular:     Rate and Rhythm: Normal rate and regular rhythm.  Pulmonary:     Effort: Pulmonary effort is normal.     Breath sounds: Normal breath sounds.  Musculoskeletal:        General: Normal range of motion.     Cervical back: Normal range of motion and neck supple.  Skin:    General:  Skin is warm and dry.  Neurological:     General: No focal deficit present.     Mental  Status: She is alert and oriented to person, place, and time. Mental status is at baseline.  Psychiatric:        Mood and Affect: Mood normal.        Behavior: Behavior normal.        Thought Content: Thought content normal.        Judgment: Judgment normal.    Results for orders placed or performed in visit on 05/23/24  POC COVID-19 BinaxNow  Result Value Ref Range   SARS Coronavirus 2 Ag Positive (A) Negative        Assessment & Plan:   Problem List Items Addressed This Visit   None Visit Diagnoses       Acute cough    -  Primary   Relevant Orders   POC COVID-19 BinaxNow (Completed)   POCT Influenza A/B     COVID-19           Meds ordered this encounter  Medications   HYDROcodone  bit-homatropine (HYCODAN) 5-1.5 MG/5ML syrup    Sig: Take 5 mLs by mouth every 8 (eight) hours as needed for cough.    Dispense:  120 mL    Refill:  0  Call the office if symptoms worsen or persist.  Recheck as scheduled and sooner as needed.  No follow-ups on file.  Shenee Wignall B Mayumi Summerson, FNP       [1] Allergies Allergen Reactions   Alpha-Gal Anaphylaxis, Hives and Other (See Comments)    Alpha-gal Syndrome (AGS)   Milk (Cow) Anaphylaxis    Alpha-gal Syndrome (AGS)   Milk-Related Compounds Anaphylaxis and Other (See Comments)    Alpha-gal Syndrome (AGS)   Other Anaphylaxis and Other (See Comments)    NO ANIMAL-DERIVED PRODUCTS!! Alpha-gal Syndrome (AGS)   Bactrim  [Sulfamethoxazole -Trimethoprim ] Other (See Comments)    Mouth ulcers and sores  [2] Current Outpatient Medications on File Prior to Visit  Medication Sig Dispense Refill   acetaminophen  (TYLENOL ) 325 MG tablet Take 325 mg by mouth 2 (two) times daily as needed (pain or headaches).     amitriptyline  (ELAVIL ) 10 MG tablet Take 1 tablet (10 mg total) by mouth at bedtime. 30 tablet 3   amLODipine  (NORVASC ) 5 MG tablet TAKE 1  TABLET EVERY DAY FOR BLOOD PRESSURE 90 tablet 1   atorvastatin  (LIPITOR) 10 MG tablet TAKE 1 TABLET DAILY FOR CHOLESTEROL 90 tablet 2   azelastine  (ASTELIN ) 0.1 % nasal spray Place 1 spray into both nostrils 2 (two) times daily. Use in each nostril as directed 30 mL 5   CALCIUM -VITAMIN D  PO Take 2 tablets by mouth daily.     cetirizine  (ZYRTEC  ALLERGY ) 10 MG tablet Take 1 tablet (10 mg total) by mouth daily. 30 tablet 5   ciprofloxacin  (CIPRO ) 500 MG tablet Take 1 tablet (500 mg total) by mouth every 12 (twelve) hours. 10 tablet 0   cyclobenzaprine  (FLEXERIL ) 5 MG tablet Take 1 tablet (5 mg total) by mouth at bedtime as needed for muscle spasms. 15 tablet 0   dicyclomine  (BENTYL ) 10 MG capsule Take 1 capsule (10 mg total) by mouth 3 (three) times daily before meals. 270 capsule 3   EPINEPHrine  0.3 mg/0.3 mL IJ SOAJ injection Inject 0.3 mg into the muscle as directed. 2 each 1   fluticasone  (FLONASE ) 50 MCG/ACT nasal spray USE 2 SPRAYS IN EACH NOSTRIL EVERY DAY AS NEEDED FOR ALLERGIES OR RHINITIS 48 g 2   metoprolol  succinate (TOPROL -XL) 50 MG 24 hr tablet Take 25 mg by mouth as needed.  mupirocin  ointment (BACTROBAN ) 2 % Apply 1 Application topically 2 (two) times daily. 22 g 0   omeprazole  (PRILOSEC) 40 MG capsule Take 1 capsule (40 mg total) by mouth 2 (two) times daily. TAKE 1 CAPSULE TWICE DAILY FOR HEARTBURN 180 capsule 3   ondansetron  (ZOFRAN -ODT) 8 MG disintegrating tablet Take 1 tablet (8 mg total) by mouth 3 (three) times daily as needed. 90 tablet 11   prochlorperazine  (COMPAZINE ) 5 MG tablet Take 1 tablet (5 mg total) by mouth every 12 (twelve) hours as needed for nausea or vomiting. 30 tablet 0   Prucalopride Succinate  2 MG TABS Take 1 tablet (2 mg total) by mouth daily. 90 tablet 3   sucralfate  (CARAFATE ) 1 g tablet TAKE 1 TABLET THREE TIMES DAILY BEFORE MEALS 90 tablet 11   topiramate  (TOPAMAX ) 50 MG tablet Take 1 tablet (50 mg total) by mouth daily. For headache  prevention 90 tablet 2   traMADol  (ULTRAM ) 50 MG tablet Take 50 mg by mouth 3 (three) times daily as needed.     triamcinolone  (KENALOG ) 0.1 % paste Use as directed 1 Application in the mouth or throat 2 (two) times daily. 5 g 0   valACYclovir  (VALTREX ) 1000 MG tablet Take 2 tablets twice daily for 1 day. 4 tablet 0   valsartan  (DIOVAN ) 160 MG tablet Take 160 mg by mouth daily.     No current facility-administered medications on file prior to visit.  "

## 2024-05-23 NOTE — Telephone Encounter (Signed)
 Copied from CRM #[id number]. Topic: Clinical - Prescription Issue >> May 23, 2024  3:09 PM Jasmin G wrote: Reason for CRM: Pt states that she was recently prescribed a cough syrup to treat covid and [facility] [address] [place], [place] - [address] told her that they would need to transfer to prescription to [id number] - [place], [place] - [address] since they don't have in stock. Pt requested for this to be done, if possible as she states that she needs the med.

## 2024-05-26 ENCOUNTER — Ambulatory Visit: Admitting: Urology

## 2024-05-26 MED ORDER — HYDROCODONE BIT-HOMATROP MBR 5-1.5 MG/5ML PO SOLN: 5.0000 mL | Freq: Three times a day (TID) | ORAL | 0 refills | Status: AC | PRN

## 2024-05-26 NOTE — Telephone Encounter (Unsigned)
 Copied from CRM #[id number]. Topic: Clinical - Prescription Issue >> May 23, 2024  3:09 PM Jasmin G wrote: Reason for CRM: Pt states that she was recently prescribed a cough syrup to treat covid and [facility] [address] [place], [place] - [address] told her that they would need to transfer to prescription to [id number] - [place], [place] - [address] since they don't have in stock. Pt requested for this to be done, if possible as she states that she needs the med. >> May 23, 2024  5:02 PM China J wrote: Patient is calling to see if her medication was every transferred to the pharmacy she requested. She has a bad cough and needs this medication sent in as soon as possible.

## 2024-05-26 NOTE — Telephone Encounter (Signed)
 Already addressed. Rx already sent to requested pharmacy and detailed message left for pt (see 10:14 AM thread below).

## 2024-05-26 NOTE — Telephone Encounter (Signed)
 Matt sent Hycodan cough syrup rx to [facility] today.   Left message on voicemail, per dpr, notifying pt of above info.

## 2024-06-03 NOTE — Progress Notes (Signed)
 "    06/04/2024 8:26 PM   Valerie [name] 01/21/1951 [id number]  Referring provider: Gretta Comer MARLA, NP [address] [place],  [place] [address]  Urological history: 1.  High risk hematuria - Non-smoker - cysto (2022) NED - urine cytology (2022) negative - contrast CT (11/2023) NED   2. rUTI's  - February 22, 2024, E. Coli - December 03, 2023, multiple species - June 11, 2023, ESBL E. Coli  CC: recurrent UTI's  HPI: Valerie [name] is a 74 y.o. woman who presents today for three month follow up.   Previous records reviewed.  She is having 8 or more daytime voids, she is having nocturia 1-2 and urgency is mild.  She is having both urge and stress incontinence. She is having urinary leakage 1-2 times weekly.She is not using absorbent products for leakage. She is limiting fluids.  She does engage in toilet mapping.  She is having leakage upon standing after voiding.   She has also started seeing blood in her urine and when she wipes.  She is also complaining of lower abdominal pain that has been occurring for months.  She had a contrast CT in July which was negative.    UA yellow clear, <1.005, pH 6.0, 2+ heme, 1+ leuks, 0-5  WBC's, 3-10 RBC's, 0-10 epithelial cells, and few bacteria.    Serum creatinine (11/2023) 1.02  PMH: Past Medical History:  Diagnosis Date   Acid reflux    Acute cystitis with hematuria 12/08/2020   Acute cystitis without hematuria 01/10/2023   Adenocarcinoma of the endometrium/uterus (HCC) 12/04/2000   Anal fissure    Anxiety    Atrophic vaginitis    COVID-19 virus infection 09/28/2020   Dehydration    Difficult intubation    limited neck mobility   Dysuria 08/13/2020   Esophagitis    Hernia    Present to proximal to umbillical    History of hiatal hernia    Hyperlipidemia    Hypertension    Hypokalemia 09/15/2022   Internal hemorrhoids    Intractable abdominal pain 09/15/2022   Lactic acidosis    Menorrhagia    Osteopenia    Partial  small bowel obstruction (HCC) 01/26/2021   PONV (postoperative nausea and vomiting)    Recurrent UTI    Vertigo    Vitamin D  deficiency     Surgical History: Past Surgical History:  Procedure Laterality Date   74 HOUR PH STUDY N/A 11/29/2015   Procedure: 24 HOUR PH STUDY;  Surgeon: Elspeth Deward Naval, MD;  Location: [facility];  Service: Gastroenterology;  Laterality: N/A;   ABDOMINAL HYSTERECTOMY  12/04/2000   also bilateral salpingo-oophorectomy   CARDIAC CATHETERIZATION  2006   CARPAL TUNNEL RELEASE  11/20/2011   Procedure: CARPAL TUNNEL RELEASE;  Surgeon: Arley SHAUNNA Helling, MD;  Location: [facility];  Service: Neurosurgery;  Laterality: Left;  LEFT carpal tunnel release   CERVICAL SPINE SURGERY  2013   [facility]   CHOLECYSTECTOMY  1997   COLONOSCOPY WITH PROPOFOL  N/A 07/27/2020   Procedure: COLONOSCOPY WITH PROPOFOL ;  Surgeon: Jinny Carmine, MD;  Location: [facility];  Service: Endoscopy;  Laterality: N/A;   DILATION AND CURETTAGE OF UTERUS     11/15/2000   ENDOSCOPIC RETROGRADE CHOLANGIOPANCREATOGRAPHY (ERCP) WITH PROPOFOL  N/A 09/14/2022   Procedure: ENDOSCOPIC RETROGRADE CHOLANGIOPANCREATOGRAPHY (ERCP) WITH PROPOFOL ;  Surgeon: Wilhelmenia Aloha Raddle., MD;  Location: [facility];  Service: Gastroenterology;  Laterality: N/A;   ESOPHAGEAL MANOMETRY N/A 09/20/2015   Procedure: ESOPHAGEAL MANOMETRY (EM);  Surgeon: Elspeth Deward Naval, MD;  Location: [facility];  Service: Gastroenterology;  Laterality: N/A;   ESOPHAGOGASTRODUODENOSCOPY (EGD) WITH PROPOFOL  N/A 05/17/2021   Procedure: ESOPHAGOGASTRODUODENOSCOPY (EGD) WITH PROPOFOL ;  Surgeon: Janalyn Keene NOVAK, MD;  Location: [facility];  Service: Endoscopy;  Laterality: N/A;   ESOPHAGOGASTRODUODENOSCOPY (EGD) WITH PROPOFOL  N/A 07/17/2022   Procedure: ESOPHAGOGASTRODUODENOSCOPY (EGD) WITH PROPOFOL ;  Surgeon: Therisa Bi, MD;  Location: [facility];  Service: Gastroenterology;  Laterality: N/A;   ESOPHAGOGASTRODUODENOSCOPY (EGD)  WITH PROPOFOL  N/A 09/14/2022   Procedure: ESOPHAGOGASTRODUODENOSCOPY (EGD) WITH PROPOFOL ;  Surgeon: Wilhelmenia Aloha Raddle., MD;  Location: [facility];  Service: Gastroenterology;  Laterality: N/A;   EUS N/A 09/14/2022   Procedure: UPPER ENDOSCOPIC ULTRASOUND (EUS) RADIAL;  Surgeon: Wilhelmenia Aloha Raddle., MD;  Location: [facility];  Service: Gastroenterology;  Laterality: N/A;   HYSTEROSCOPY  11/15/2000   D & C, resection of endometrial polyps   LUMBAR SPINAL CORD STIMULATOR LEAD REMOVAL N/A 08/01/2021   Procedure: REMOVAL OF SPINAL CORD STIMULATOR;  Surgeon: Clois Fret, MD;  Location: [facility];  Service: Neurosurgery;  Laterality: N/A;   OOPHORECTOMY  12/04/2000   bilateral salpingo-oophorectomy done with TAH   REMOVAL OF STONES  09/14/2022   Procedure: REMOVAL OF STONES;  Surgeon: Wilhelmenia Aloha Raddle., MD;  Location: [facility];  Service: Gastroenterology;;   ANNETT  09/14/2022   Procedure: ANNETT;  Surgeon: Mansouraty, Aloha Raddle., MD;  Location: [facility];  Service: Gastroenterology;;   SPINAL CORD STIMULATOR INSERTION Bilateral 04/12/2020   Procedure: INSERTION CERVICAL SPINAL STIMULATOR PULSE GENERATOR;  Surgeon: Bluford Elspeth, MD;  Location: [facility];  Service: Neurosurgery;  Laterality: Bilateral;   SPINAL CORD STIMULATOR TRIAL N/A 03/22/2020   Procedure: CERVICAL SPINAL CORD STIMULATOR TRIAL PERCUTANEOUS;  Surgeon: Bluford Elspeth, MD;  Location: [facility];  Service: Neurosurgery;  Laterality: N/A;   TUBAL LIGATION      Home Medications:  Allergies as of 06/04/2024       Reactions   Alpha-gal Anaphylaxis, Hives, Other (See Comments)   Alpha-gal Syndrome (AGS)   Milk (cow) Anaphylaxis   Alpha-gal Syndrome (AGS)   Milk-related Compounds Anaphylaxis, Other (See Comments)   Alpha-gal Syndrome (AGS)   Other Anaphylaxis, Other (See Comments)   NO ANIMAL-DERIVED PRODUCTS!! Alpha-gal Syndrome (AGS)   Bactrim  [sulfamethoxazole -trimethoprim ] Other (See  Comments)   Mouth ulcers and sores        Medication List        Accurate as of June 04, 2024 11:59 PM. If you have any questions, ask your nurse or doctor.          acetaminophen  325 MG tablet Commonly known as: TYLENOL  Take 325 mg by mouth 2 (two) times daily as needed (pain or headaches).   amitriptyline  10 MG tablet Commonly known as: ELAVIL  Take 1 tablet (10 mg total) by mouth at bedtime.   amLODipine  5 MG tablet Commonly known as: NORVASC  TAKE 1 TABLET EVERY DAY FOR BLOOD PRESSURE   atorvastatin  10 MG tablet Commonly known as: LIPITOR TAKE 1 TABLET DAILY FOR CHOLESTEROL   azelastine  0.1 % nasal spray Commonly known as: ASTELIN  Place 1 spray into both nostrils 2 (two) times daily. Use in each nostril as directed   CALCIUM -VITAMIN D  PO Take 2 tablets by mouth daily.   cetirizine  10 MG tablet Commonly known as: ZyrTEC  Allergy  Take 1 tablet (10 mg total) by mouth daily.   ciprofloxacin  500 MG tablet Commonly known as: CIPRO  Take 1 tablet (500 mg total) by mouth every 12 (twelve) hours.   cyclobenzaprine  5 MG  tablet Commonly known as: FLEXERIL  Take 1 tablet (5 mg total) by mouth at bedtime as needed for muscle spasms.   dicyclomine  10 MG capsule Commonly known as: BENTYL  Take 1 capsule (10 mg total) by mouth 3 (three) times daily before meals.   EPINEPHrine  0.3 mg/0.3 mL Soaj injection Commonly known as: EPI-PEN Inject 0.3 mg into the muscle as directed.   fluticasone  50 MCG/ACT nasal spray Commonly known as: FLONASE  USE 2 SPRAYS IN EACH NOSTRIL EVERY DAY AS NEEDED FOR ALLERGIES OR RHINITIS   Gemtesa  75 MG Tabs Generic drug: Vibegron  Take 1 tablet (75 mg total) by mouth daily for 28 days. Started by: CLOTILDA CORNWALL   HYDROcodone  bit-homatropine 5-1.5 MG/5ML syrup Commonly known as: HYCODAN Take 5 mLs by mouth every 8 (eight) hours as needed for cough.   metoprolol  succinate 50 MG 24 hr tablet Commonly known as: TOPROL -XL Take 25 mg by  mouth as needed.   mupirocin  ointment 2 % Commonly known as: BACTROBAN  Apply 1 Application topically 2 (two) times daily.   omeprazole  40 MG capsule Commonly known as: PRILOSEC Take 1 capsule (40 mg total) by mouth 2 (two) times daily. TAKE 1 CAPSULE TWICE DAILY FOR HEARTBURN   ondansetron  8 MG disintegrating tablet Commonly known as: ZOFRAN -ODT Take 1 tablet (8 mg total) by mouth 3 (three) times daily as needed.   prochlorperazine  5 MG tablet Commonly known as: COMPAZINE  Take 1 tablet (5 mg total) by mouth every 12 (twelve) hours as needed for nausea or vomiting.   Prucalopride Succinate  2 MG Tabs Take 1 tablet (2 mg total) by mouth daily.   sucralfate  1 g tablet Commonly known as: CARAFATE  TAKE 1 TABLET THREE TIMES DAILY BEFORE MEALS   topiramate  50 MG tablet Commonly known as: Topamax  Take 1 tablet (50 mg total) by mouth daily. For headache prevention   traMADol  50 MG tablet Commonly known as: ULTRAM  Take 50 mg by mouth 3 (three) times daily as needed.   triamcinolone  0.1 % paste Commonly known as: KENALOG  Use as directed 1 Application in the mouth or throat 2 (two) times daily.   valACYclovir  1000 MG tablet Commonly known as: Valtrex  Take 2 tablets twice daily for 1 day.   valsartan  160 MG tablet Commonly known as: DIOVAN  Take 160 mg by mouth daily.        Allergies: Allergies[1]  Family History: Family History  Problem Relation Age of Onset   Hypertension Father    Prostate cancer Father    Lung cancer Sister    Diabetes Sister    Diabetes Brother    Heart disease Brother    Hypertension Brother    Diabetes Brother    Heart disease Brother    Lung cancer Brother    Heart attack Brother    Hypertension Son    Other Son        spots on brain   Autoimmune disease Daughter    Breast cancer Neg Hx     Social History:  reports that she has never smoked. She has never been exposed to tobacco smoke. She has never used smokeless tobacco. She  reports that she does not drink alcohol and does not use drugs.  ROS: Pertinent ROS in HPI  Physical Exam: BP 132/83   Pulse (!) 103   Wt 114 lb (51.7 kg)   SpO2 98%   BMI 19.88 kg/m   Constitutional:  Well nourished. Alert and oriented, No acute distress. HEENT: [place] AT, moist mucus membranes.  Trachea midline  Cardiovascular: No clubbing, cyanosis, or edema. Respiratory: Normal respiratory effort, no increased work of breathing. Neurologic: Grossly intact, no focal deficits, moving all 4 extremities. Psychiatric: Normal mood and affect.    Laboratory Data: See Epic and HPI   I have reviewed the labs.   Pertinent Imaging: N/A  Assessment & Plan:    1. Gross hematuria -We reviewed that hematuria (blood in the urine) may result from a variety of causes, including nephrolithiasis (kidney stones), benign prostatic hyperplasia, urinary tract infections (UTIs), trauma or structural abnormalities of the urinary tract, and malignancy. It was noted that in some cases, no definitive etiology is identified despite thorough evaluation. -contrast CT in July was negative -will repeat cystoscopy at this time Reviewed that this procedure involves passing a small camera through the urethra into the bladder after administration of topical lidocaine  for local anesthesia. Post-procedural symptoms may include mild hematuria and dysuria, typically resolving within 24 to 48 hours. -The patient was given the opportunity to ask questions, which were addressed in detail. After reviewing the risks, benefits, and rationale for evaluation, the patient expressed understanding and agreed to proceed. - Urinalysis (UA)  - Urine culture   2. OAB - given samples of Gemtesa  75 mg daily, # 28 samples - if no worrisome findings see on upcoming cysto, will reassess                                         Return for cystoscopy for gross hematuria .  These notes generated with voice recognition software. I  apologize for typographical errors.  CLOTILDA CORNWALL, PA-C  [facility] [address]  [address] [place], [place] [address] [phone]     [1]  Allergies Allergen Reactions   Alpha-Gal Anaphylaxis, Hives and Other (See Comments)    Alpha-gal Syndrome (AGS)   Milk (Cow) Anaphylaxis    Alpha-gal Syndrome (AGS)   Milk-Related Compounds Anaphylaxis and Other (See Comments)    Alpha-gal Syndrome (AGS)   Other Anaphylaxis and Other (See Comments)    NO ANIMAL-DERIVED PRODUCTS!! Alpha-gal Syndrome (AGS)   Bactrim  [Sulfamethoxazole -Trimethoprim ] Other (See Comments)    Mouth ulcers and sores   "

## 2024-06-04 ENCOUNTER — Ambulatory Visit: Admitting: Urology

## 2024-06-04 ENCOUNTER — Ambulatory Visit: Payer: Self-pay

## 2024-06-04 VITALS — BP 132/83 | HR 103 | Wt 114.0 lb

## 2024-06-04 DIAGNOSIS — R310 Gross hematuria: Secondary | ICD-10-CM | POA: Diagnosis not present

## 2024-06-04 DIAGNOSIS — N3281 Overactive bladder: Secondary | ICD-10-CM

## 2024-06-04 DIAGNOSIS — N390 Urinary tract infection, site not specified: Secondary | ICD-10-CM

## 2024-06-04 LAB — MICROSCOPIC EXAMINATION

## 2024-06-04 MED ORDER — GEMTESA 75 MG PO TABS: 75.0000 mg | ORAL_TABLET | Freq: Every day | ORAL | Status: AC

## 2024-06-04 NOTE — Telephone Encounter (Signed)
 Patient called, left VM to return the call to the office to speak to NT.     Copied from CRM #[id number]. Topic: Clinical - Red Word Triage >> Jun 04, 2024  2:47 PM Jayma L wrote: Red Word that prompted transfer to Nurse Triage: dizzy and light headed from ear pain on left side , seen blood in ear

## 2024-06-05 LAB — MICROSCOPIC EXAMINATION

## 2024-06-05 LAB — URINALYSIS, COMPLETE
Bilirubin, UA: NEGATIVE
Glucose, UA: NEGATIVE
Ketones, UA: NEGATIVE
Nitrite, UA: NEGATIVE
Protein,UA: NEGATIVE
Specific Gravity, UA: <1.005 — ABNORMAL LOW (ref 1.005–1.030)
Urobilinogen, Ur: 0.2 mg/dL (ref 0.2–1.0)
pH, UA: 6 (ref 5.0–7.5)

## 2024-06-05 NOTE — Telephone Encounter (Addendum)
 I spoke with pt; pt said for about 1 wk has had lt sharp ear pain that is constant; pt said 2 days ago she saw blood in lt ear. Has not seen blood in ear since 06/04/23.pt has dizziness where room spins around intermittently pt is not sure if has a fever but pt is sweating at times.pt said may not be able to hear out of lt ear as well as rt ear. No H/A and no S/T. No available appts at any [facility] office this afternoon and no available appts at [facility] or [facility] on 06/07/23.UC & ED precautions given and pt voiced understanding but did not want to sit and wait to be seen.  I scheduled pt appt at [facility] on 06/06/24 at 1:45 with UC & ED precautions and pt voiced understanding. Pt said if worsened prior to 06/06/24 appt  pt would go to UC or ED. Sending note to MARLA Gaskins NP and [facility] pool.

## 2024-06-05 NOTE — Telephone Encounter (Signed)
 Please triage pt

## 2024-06-06 ENCOUNTER — Ambulatory Visit
Admission: RE | Admit: 2024-06-06 | Discharge: 2024-06-06 | Disposition: A | Discharge status: Home / Self Care | Attending: Emergency Medicine

## 2024-06-06 VITALS — BP 137/87 | HR 89 | Temp 98.0°F | Resp 16

## 2024-06-06 DIAGNOSIS — H6692 Otitis media, unspecified, left ear: Secondary | ICD-10-CM

## 2024-06-06 MED ORDER — AMOXICILLIN 875 MG PO TABS: 875.0000 mg | ORAL_TABLET | Freq: Two times a day (BID) | ORAL | 0 refills | Status: DC

## 2024-06-06 NOTE — ED Triage Notes (Signed)
 Patient to Urgent Care with complaints of left sided ear pain. Has also noticed some bloody drainage.  Symptoms x2 weeks.

## 2024-06-06 NOTE — Discharge Instructions (Addendum)
 Take the amoxicillin as directed.  Follow up with your primary care provider if your symptoms are not improving.

## 2024-06-06 NOTE — Telephone Encounter (Signed)
 Noted

## 2024-06-06 NOTE — ED Provider Notes (Signed)
 " CAY RALPH PELT    CSN: [id number] Arrival date & time: 06/06/24  1332      History   Chief Complaint Chief Complaint  Patient presents with   Ear Drainage    Entered by patient    HPI Valerie [name] is a 74 y.o. female.  Patient presents with 2-week history of left ear pain and bloody drainage.  No trauma.  No fever, cough, shortness of breath.  Treating with Tylenol .  Patient reports history of ear infections as an adult; last occurred approximately 1 year ago.  Her medical history also includes allergic rhinitis, hypertension, hyperlipidemia.  The history is provided by the patient and medical records.    Past Medical History:  Diagnosis Date   Acid reflux    Acute cystitis with hematuria 12/08/2020   Acute cystitis without hematuria 01/10/2023   Adenocarcinoma of the endometrium/uterus (HCC) 12/04/2000   Anal fissure    Anxiety    Atrophic vaginitis    COVID-19 virus infection 09/28/2020   Dehydration    Difficult intubation    limited neck mobility   Dysuria 08/13/2020   Esophagitis    Hernia    Present to proximal to umbillical    History of hiatal hernia    Hyperlipidemia    Hypertension    Hypokalemia 09/15/2022   Internal hemorrhoids    Intractable abdominal pain 09/15/2022   Lactic acidosis    Menorrhagia    Osteopenia    Partial small bowel obstruction (HCC) 01/26/2021   PONV (postoperative nausea and vomiting)    Recurrent UTI    Vertigo    Vitamin D  deficiency     Patient Active Problem List   Diagnosis Date Noted   Cerumen impaction 07/13/2023   Lip lesion 02/02/2023   Localized skin mass, lump, or swelling 12/11/2022   Choledocholithiasis 09/15/2022   Abnormal CT scan, gastrointestinal tract 07/17/2022   Generalized abdominal pain 12/16/2021   Frequent headaches 12/16/2021   Chronic nausea    Gastric erythema    Epigastric pain    Depression    Atherosclerosis of abdominal aorta 12/15/2020   Microscopic hematuria 12/08/2020    Chronic constipation 08/13/2020   Tinnitus of both ears 10/02/2019   ETD (Eustachian tube dysfunction), right 04/29/2019   Weakness of both lower extremities 04/03/2019   Chronic back pain 04/03/2019   Preventative health care 02/14/2019   Vulvar dermatitis 01/01/2018   Allergic rhinitis 08/22/2017   Neck pain with history of cervical spinal surgery 01/22/2017   Prediabetes 11/16/2016   Hyperlipidemia 11/16/2015   Esophageal reflux    Hemorrhoid 06/30/2015   Atypical chest pain 01/04/2015   Palpitations 01/04/2015   Welcome to Medicare preventive visit 10/09/2014   Benign paroxysmal positional vertigo 08/31/2014   Hernia    Osteopenia    Vitamin D  deficiency    Adenocarcinoma of the endometrium/uterus (HCC)    Generalized anxiety disorder 01/23/2007   Essential hypertension 01/23/2007    Past Surgical History:  Procedure Laterality Date   24 HOUR PH STUDY N/A 11/29/2015   Procedure: 24 HOUR PH STUDY;  Surgeon: Elspeth Deward Naval, MD;  Location: [facility];  Service: Gastroenterology;  Laterality: N/A;   ABDOMINAL HYSTERECTOMY  12/04/2000   also bilateral salpingo-oophorectomy   CARDIAC CATHETERIZATION  2006   CARPAL TUNNEL RELEASE  11/20/2011   Procedure: CARPAL TUNNEL RELEASE;  Surgeon: Arley SHAUNNA Helling, MD;  Location: [facility];  Service: Neurosurgery;  Laterality: Left;  LEFT carpal tunnel release   CERVICAL  SPINE SURGERY  2013   [facility]   CHOLECYSTECTOMY  1997   COLONOSCOPY WITH PROPOFOL  N/A 07/27/2020   Procedure: COLONOSCOPY WITH PROPOFOL ;  Surgeon: Jinny Carmine, MD;  Location[facility];  Service: Endoscopy;  Laterality: N/A;   DILATION AND CURETTAGE OF UTERUS     11/15/2000   ENDOSCOPIC RETROGRADE CHOLANGIOPANCREATOGRAPHY (ERCP) WITH PROPOFOL  N/A 09/14/2022   Procedure: ENDOSCOPIC RETROGRADE CHOLANGIOPANCREATOGRAPHY (ERCP) WITH PROPOFOL ;  Surgeon: Wilhelmenia Aloha Raddle., MD;  Location: [facility];  Service: Gastroenterology;  Laterality: N/A;   ESOPHAGEAL  MANOMETRY N/A 09/20/2015   Procedure: ESOPHAGEAL MANOMETRY (EM);  Surgeon: Elspeth Deward Naval, MD;  Location: [facility];  Service: Gastroenterology;  Laterality: N/A;   ESOPHAGOGASTRODUODENOSCOPY (EGD) WITH PROPOFOL  N/A 05/17/2021   Procedure: ESOPHAGOGASTRODUODENOSCOPY (EGD) WITH PROPOFOL ;  Surgeon: Janalyn Keene NOVAK, MD;  Location: [facility];  Service: Endoscopy;  Laterality: N/A;   ESOPHAGOGASTRODUODENOSCOPY (EGD) WITH PROPOFOL  N/A 07/17/2022   Procedure: ESOPHAGOGASTRODUODENOSCOPY (EGD) WITH PROPOFOL ;  Surgeon: Therisa Bi, MD;  Location[facility];  Service: Gastroenterology;  Laterality: N/A;   ESOPHAGOGASTRODUODENOSCOPY (EGD) WITH PROPOFOL  N/A 09/14/2022   Procedure: ESOPHAGOGASTRODUODENOSCOPY (EGD) WITH PROPOFOL ;  Surgeon: Wilhelmenia Aloha Raddle., MD;  Location: [facility];  Service: Gastroenterology;  Laterality: N/A;   EUS N/A 09/14/2022   Procedure: UPPER ENDOSCOPIC ULTRASOUND (EUS) RADIAL;  Surgeon: Wilhelmenia Aloha Raddle., MD;  Location: [facility];  Service: Gastroenterology;  Laterality: N/A;   HYSTEROSCOPY  11/15/2000   D & C, resection of endometrial polyps   LUMBAR SPINAL CORD STIMULATOR LEAD REMOVAL N/A 08/01/2021   Procedure: REMOVAL OF SPINAL CORD STIMULATOR;  Surgeon: Clois Fret, MD;  Location: [facility];  Service: Neurosurgery;  Laterality: N/A;   OOPHORECTOMY  12/04/2000   bilateral salpingo-oophorectomy done with TAH   REMOVAL OF STONES  09/14/2022   Procedure: REMOVAL OF STONES;  Surgeon: Wilhelmenia Aloha Raddle., MD;  Location: [facility];  Service: Gastroenterology;;   ANNETT  09/14/2022   Procedure: ANNETT;  Surgeon: Mansouraty, Aloha Raddle., MD;  Location: [facility];  Service: Gastroenterology;;   SPINAL CORD STIMULATOR INSERTION Bilateral 04/12/2020   Procedure: INSERTION CERVICAL SPINAL STIMULATOR PULSE GENERATOR;  Surgeon: Bluford Elspeth, MD;  Location: [facility];  Service: Neurosurgery;  Laterality: Bilateral;   SPINAL  CORD STIMULATOR TRIAL N/A 03/22/2020   Procedure: CERVICAL SPINAL CORD STIMULATOR TRIAL PERCUTANEOUS;  Surgeon: Bluford Elspeth, MD;  Location: [facility];  Service: Neurosurgery;  Laterality: N/A;   TUBAL LIGATION      OB History     Gravida  3   Para  2   Term  2   Preterm      AB  1   Living  2      SAB      IAB  1   Ectopic      Multiple      Live Births  2            Home Medications    Prior to Admission medications  Medication Sig Start Date End Date Taking? Authorizing Provider  amoxicillin  (AMOXIL ) 875 MG tablet Take 1 tablet (875 mg total) by mouth 2 (two) times daily for 10 days. 06/06/24 06/16/24 Yes Corlis Burnard DEL, NP  acetaminophen  (TYLENOL ) 325 MG tablet Take 325 mg by mouth 2 (two) times daily as needed (pain or headaches).    [provider]  amitriptyline  (ELAVIL ) 10 MG tablet Take 1 tablet (10 mg total) by mouth at bedtime. 03/25/24   Cirigliano, Vito V, DO  amLODipine  (NORVASC ) 5 MG tablet TAKE 1  TABLET EVERY DAY FOR BLOOD PRESSURE 03/05/24   Clark, Katherine K, NP  atorvastatin  (LIPITOR) 10 MG tablet TAKE 1 TABLET DAILY FOR CHOLESTEROL 01/07/24   Clark, Katherine K, NP  azelastine  (ASTELIN ) 0.1 % nasal spray Place 1 spray into both nostrils 2 (two) times daily. Use in each nostril as directed 11/14/22   Tobie Arleta SQUIBB, MD  CALCIUM -VITAMIN D  PO Take 2 tablets by mouth daily.    [provider]  cetirizine  (ZYRTEC  ALLERGY ) 10 MG tablet Take 1 tablet (10 mg total) by mouth daily. 11/14/22   Tobie Arleta SQUIBB, MD  ciprofloxacin  (CIPRO ) 500 MG tablet Take 1 tablet (500 mg total) by mouth every 12 (twelve) hours. Patient not taking: Reported on 06/06/2024 02/22/24   Francisca Redell BROCKS, MD  cyclobenzaprine  (FLEXERIL ) 5 MG tablet Take 1 tablet (5 mg total) by mouth at bedtime as needed for muscle spasms. 04/06/23   Clark, Katherine K, NP  dicyclomine  (BENTYL ) 10 MG capsule Take 1 capsule (10 mg total) by mouth 3 (three) times daily before meals. 04/21/24    Cirigliano, Vito V, DO  EPINEPHrine  0.3 mg/0.3 mL IJ SOAJ injection Inject 0.3 mg into the muscle as directed. 11/14/22   Tobie Arleta SQUIBB, MD  fluticasone  (FLONASE ) 50 MCG/ACT nasal spray USE 2 SPRAYS IN EACH NOSTRIL EVERY DAY AS NEEDED FOR ALLERGIES OR RHINITIS 01/29/24   Clark, Katherine K, NP  HYDROcodone  bit-homatropine (HYCODAN) 5-1.5 MG/5ML syrup Take 5 mLs by mouth every 8 (eight) hours as needed for cough. 05/26/24   Wendee Lynwood HERO, NP  metoprolol  succinate (TOPROL -XL) 50 MG 24 hr tablet Take 25 mg by mouth as needed. 10/15/23   [provider]  mupirocin  ointment (BACTROBAN ) 2 % Apply 1 Application topically 2 (two) times daily. 02/12/23   Gretta Comer POUR, NP  omeprazole  (PRILOSEC) 40 MG capsule Take 1 capsule (40 mg total) by mouth 2 (two) times daily. TAKE 1 CAPSULE TWICE DAILY FOR HEARTBURN 07/31/23 07/25/24  [place], PA-C  ondansetron  (ZOFRAN -ODT) 8 MG disintegrating tablet Take 1 tablet (8 mg total) by mouth 3 (three) times daily as needed. 07/31/23 07/25/24  [place], PA-C  prochlorperazine  (COMPAZINE ) 5 MG tablet Take 1 tablet (5 mg total) by mouth every 12 (twelve) hours as needed for nausea or vomiting. 11/06/23   May, Deanna J, NP  Prucalopride Succinate  2 MG TABS Take 1 tablet (2 mg total) by mouth daily. 03/25/24   Cirigliano, Vito V, DO  sucralfate  (CARAFATE ) 1 g tablet TAKE 1 TABLET THREE TIMES DAILY BEFORE MEALS 03/31/24   Collier, Amanda R, PA-C  topiramate  (TOPAMAX ) 50 MG tablet Take 1 tablet (50 mg total) by mouth daily. For headache prevention 04/04/22   Clark, Katherine K, NP  traMADol  (ULTRAM ) 50 MG tablet Take 50 mg by mouth 3 (three) times daily as needed. 08/29/23   [provider]  triamcinolone  (KENALOG ) 0.1 % paste Use as directed 1 Application in the mouth or throat 2 (two) times daily. 03/01/23   Clark, Katherine K, NP  valACYclovir  (VALTREX ) 1000 MG tablet Take 2 tablets twice daily for 1 day. 02/08/23   Clark, Katherine K, NP  valsartan  (DIOVAN ) 160  MG tablet Take 160 mg by mouth daily. 04/09/23   [provider]  Vibegron  (GEMTESA ) 75 MG TABS Take 1 tablet (75 mg total) by mouth daily for 28 days. 06/04/24 07/02/24  Helon Clotilda LABOR, PA-C    Family History Family History  Problem Relation Age of Onset   Hypertension Father  Prostate cancer Father    Lung cancer Sister    Diabetes Sister    Diabetes Brother    Heart disease Brother    Hypertension Brother    Diabetes Brother    Heart disease Brother    Lung cancer Brother    Heart attack Brother    Hypertension Son    Other Son        spots on brain   Autoimmune disease Daughter    Breast cancer Neg Hx     Social History Social History[1]   Allergies   Alpha-gal, Milk (cow), Milk-related compounds, Other, and Bactrim  [sulfamethoxazole -trimethoprim ]   Review of Systems Review of Systems  Constitutional:  Negative for chills and fever.  HENT:  Positive for ear discharge and ear pain. Negative for sore throat.   Respiratory:  Negative for cough and shortness of breath.      Physical Exam Triage Vital Signs ED Triage Vitals  Encounter Vitals Group     BP 06/06/24 1408 137/87     Girls Systolic BP Percentile --      Girls Diastolic BP Percentile --      Boys Systolic BP Percentile --      Boys Diastolic BP Percentile --      Pulse Rate 06/06/24 1408 89     Resp 06/06/24 1408 16     Temp 06/06/24 1408 98 F (36.7 C)     Temp src --      SpO2 06/06/24 1408 99 %     Weight --      Height --      Head Circumference --      Peak Flow --      Pain Score 06/06/24 1407 7     Pain Loc --      Pain Education --      Exclude from Growth Chart --    No data found.  Updated Vital Signs BP 137/87   Pulse 89   Temp 98 F (36.7 C)   Resp 16   SpO2 99%   Visual Acuity Right Eye Distance:   Left Eye Distance:   Bilateral Distance:    Right Eye Near:   Left Eye Near:    Bilateral Near:     Physical Exam Constitutional:      General: She is  not in acute distress. HENT:     Right Ear: Tympanic membrane and ear canal normal.     Left Ear: Ear canal normal.     Ears:     Comments: Left TM mildly erythematous.    Nose: Nose normal.     Mouth/Throat:     Mouth: Mucous membranes are moist.     Pharynx: Oropharynx is clear.  Cardiovascular:     Rate and Rhythm: Normal rate and regular rhythm.     Heart sounds: Normal heart sounds.  Pulmonary:     Effort: Pulmonary effort is normal. No respiratory distress.     Breath sounds: Normal breath sounds.  Neurological:     Mental Status: She is alert.      UC Treatments / Results  Labs (all labs ordered are listed, but only abnormal results are displayed) Labs Reviewed - No data to display  EKG   Radiology No results found.  Procedures Procedures (including critical care time)  Medications Ordered in UC Medications - No data to display  Initial Impression / Assessment and Plan / UC Course  I have reviewed the triage vital signs and  the nursing notes.  Pertinent labs & imaging results that were available during my care of the patient were reviewed by me and considered in my medical decision making (see chart for details).    Left otitis media.  Afebrile and vital signs are stable.  Lungs are clear and O2 sat is 99% on room air.  Treating today with amoxicillin .  Tylenol  as needed.  Instructed her to follow-up with her PCP if she is not improving.  Education provided on otitis media.  She agrees to plan of care.  Final Clinical Impressions(s) / UC Diagnoses   Final diagnoses:  Left otitis media, unspecified otitis media type     Discharge Instructions      Take the amoxicillin  as directed.  Follow-up with your primary care provider if your symptoms are not improving.      ED Prescriptions     Medication Sig Dispense Auth. Provider   amoxicillin  (AMOXIL ) 875 MG tablet Take 1 tablet (875 mg total) by mouth 2 (two) times daily for 10 days. 20 tablet Corlis Burnard DEL, NP      PDMP not reviewed this encounter.    [1]  Social History Tobacco Use   Smoking status: Never    Passive exposure: Never   Smokeless tobacco: Never  Vaping Use   Vaping status: Never Used  Substance Use Topics   Alcohol use: No   Drug use: Never     Corlis Burnard DEL, NP 06/06/24 1442  "

## 2024-06-08 ENCOUNTER — Encounter: Payer: Self-pay | Admitting: Urology

## 2024-06-09 ENCOUNTER — Ambulatory Visit: Payer: Self-pay

## 2024-06-09 LAB — CULTURE, URINE COMPREHENSIVE

## 2024-06-09 NOTE — Telephone Encounter (Signed)
 FYI Only or Action Required?: Action required by provider: request for appointment.  Patient was last seen in primary care on 05/23/2024 by Valerie Kenney NOVAK, FNP.  Called Nurse Triage reporting Ear Pain.  Symptoms began several days ago.  Interventions attempted: Prescription medications: antibiotic, not helpful.  Symptoms are: unchanged.  Triage Disposition: See Physician Within 24 Hours  Patient/caregiver understands and will follow disposition?: No, wishes to speak with PCP   Copied from CRM #[id number]. Topic: Clinical - Red Word Triage >> Jun 09, 2024  4:23 PM Valerie [name] wrote: Red Word that prompted transfer to Nurse Triage: Pt calling reports she is having ear pain, an sore throat.  Pt Reports seen at urgent care last week, antibiotic was prescribed, but not helping.  Pt is requesting an appt wit provider for evaluation. Reason for Disposition  Earache  (Exceptions: Brief ear pain of lasting less than 60 minutes, or earache occurring during air travel.)  Answer Assessment - Initial Assessment Questions No available appts today/24 hours.   Advised UC/ED today and 911  if symptoms occur/worsen: severe diff breathing, chest pain > 5 min, faint, Severe HA, changes vision, confusion, diff speech/ walking, numbness/weakness one side of body. Patient verbalized understanding.   Patient declines UC and reports did not like care provided and requests appt with pcp.  Patient reports taking antibiotic since Friday, no improvement; seen in UC.  1. LOCATION: Which ear is involved?     Bilateral ear clogged, right ear pain today, sore throat; scant pain with swallowing Denies redness, swelling, drainage, drooling 2. ONSET: When did the ear pain start?      today 3. SEVERITY: How bad is the pain?  (Scale 1-10; mild, moderate or severe)     4/10 4. URI SYMPTOMS: Do you have a runny nose or cough?     none 5. FEVER: Do you have a fever? If Yes, ask: What is your temperature, how  was it measured, and when did it start?     denies 6. CAUSE: Have you been swimming recently?, How often do you use Q-TIPS?, Have you had any recent air travel or scuba diving?     No; seen in UC given antibiotic, completed,  7. OTHER SYMPTOMS: Do you have any other symptoms? (e.g., decreased hearing, dizziness, headache, stiff neck, vomiting)     Denies diff breathing, chest pain, faint, neck pain, changes vision, weakness/numbness, HA, fever chills n/v  Protocols used: Earache-A-AH

## 2024-06-10 NOTE — Telephone Encounter (Signed)
 Can we get her scheduled with someone?

## 2024-06-11 NOTE — Telephone Encounter (Signed)
 Copied from CRM [id number]. Topic: Appointments - Appointment Scheduling >> Jun 10, 2024 11:51 AM Valerie [name] wrote: Patient calling.SABRA a bit better.. throat isn't hurting, and ear is better - but still hurts.  She is returning [place] call?  Pls call her back

## 2024-06-13 ENCOUNTER — Ambulatory Visit (INDEPENDENT_AMBULATORY_CARE_PROVIDER_SITE_OTHER): Admitting: General Practice

## 2024-06-13 ENCOUNTER — Encounter: Payer: Self-pay | Admitting: General Practice

## 2024-06-13 VITALS — BP 124/80 | HR 81 | Temp 98.0°F | Ht 63.5 in | Wt 119.0 lb

## 2024-06-13 DIAGNOSIS — H9202 Otalgia, left ear: Secondary | ICD-10-CM

## 2024-06-13 DIAGNOSIS — R5383 Other fatigue: Secondary | ICD-10-CM

## 2024-06-13 NOTE — Progress Notes (Signed)
 "  Established Patient Office Visit  Subjective   Patient ID: Valerie [name], female    DOB: 1951-02-09  Age: 74 y.o. MRN: [id number]  Chief Complaint  Patient presents with   Ear Pain    Left ear pain x about a month or so. Patient denies any other ear sx except is having a lot of fatigue.     HPI  Valerie [name].  Jaeger is a 74 year old female, patient of Mallie Gaskins, NP, with past medical history of hypertension, allergic rhinitis, esophageal reflux, anxiety presents today for an acute visit to discuss ear pain.  Discussed the use of AI scribe software for clinical note transcription with the patient, who gave verbal consent to proceed.  History of Present Illness Valerie [name] is a 74 year old female who presents with persistent left ear pain.  She has been experiencing constant left ear pain for about a month, which worsens when she places her ear to the phone. She was seen at urgent care on January 9th and was diagnosed with a possible left ear infection, for which she was prescribed amoxicillin  875 mg, one tablet twice a day for ten days. She completed the course, but it did not alleviate her symptoms.  She denies fever, ear discharge, congestion, sinus pain, sore throat, tooth pain, and significant jaw pain. She initially experienced a sore throat, which she attributes to irritation from popcorn kernels. She has a history of chronic ringing in the ears.  She mentions a lack of energy, ongoing for about a month, affecting her usual activities. She typically enjoys staying busy but now has to force herself to do so. She is currently using Flonase  twice a day and has tried Tylenol  for the ear pain, but it has not been effective.  Family history includes a sister who has also experienced ear problems. She mentions having 13 siblings in total. She follows a religious practice that prohibits cutting hair, which is a shared practice with her family.    Patient Active Problem List    Diagnosis Date Noted   Cerumen impaction 07/13/2023   Lip lesion 02/02/2023   Localized skin mass, lump, or swelling 12/11/2022   Choledocholithiasis 09/15/2022   Abnormal CT scan, gastrointestinal tract 07/17/2022   Generalized abdominal pain 12/16/2021   Frequent headaches 12/16/2021   Chronic nausea    Gastric erythema    Epigastric pain    Depression    Atherosclerosis of abdominal aorta 12/15/2020   Microscopic hematuria 12/08/2020   Chronic constipation 08/13/2020   Tinnitus of both ears 10/02/2019   ETD (Eustachian tube dysfunction), right 04/29/2019   Weakness of both lower extremities 04/03/2019   Chronic back pain 04/03/2019   Preventative health care 02/14/2019   Vulvar dermatitis 01/01/2018   Allergic rhinitis 08/22/2017   Neck pain with history of cervical spinal surgery 01/22/2017   Prediabetes 11/16/2016   Hyperlipidemia 11/16/2015   Esophageal reflux    Hemorrhoid 06/30/2015   Atypical chest pain 01/04/2015   Palpitations 01/04/2015   Welcome to Medicare preventive visit 10/09/2014   Benign paroxysmal positional vertigo 08/31/2014   Hernia    Osteopenia    Vitamin D  deficiency    Adenocarcinoma of the endometrium/uterus [facility])    Generalized anxiety disorder 01/23/2007   Essential hypertension 01/23/2007   Past Medical History:  Diagnosis Date   Acid reflux    Acute cystitis with hematuria 12/08/2020   Acute cystitis without hematuria 01/10/2023   Adenocarcinoma of the endometrium/uterus (HCC) 12/04/2000  Anal fissure    Anxiety    Atrophic vaginitis    COVID-19 virus infection 09/28/2020   Dehydration    Difficult intubation    limited neck mobility   Dysuria 08/13/2020   Esophagitis    Hernia    Present to proximal to umbillical    History of hiatal hernia    Hyperlipidemia    Hypertension    Hypokalemia 09/15/2022   Internal hemorrhoids    Intractable abdominal pain 09/15/2022    Lactic acidosis    Menorrhagia    Osteopenia    Partial small bowel obstruction (HCC) 01/26/2021   PONV (postoperative nausea and vomiting)    Recurrent UTI    Vertigo    Vitamin D  deficiency    Past Surgical History:  Procedure Laterality Date   3 HOUR PH STUDY N/A 11/29/2015   Procedure: 24 HOUR PH STUDY;  Surgeon: Elspeth Deward Naval, MD;  Location: [facility];  Service: Gastroenterology;  Laterality: N/A;   ABDOMINAL HYSTERECTOMY  12/04/2000   also bilateral salpingo-oophorectomy   CARDIAC CATHETERIZATION  2006   CARPAL TUNNEL RELEASE  11/20/2011   Procedure: CARPAL TUNNEL RELEASE;  Surgeon: Arley SHAUNNA Helling, MD;  Location: [facility];  Service: Neurosurgery;  Laterality: Left;  LEFT carpal tunnel release   CERVICAL SPINE SURGERY  2013   Cram   CHOLECYSTECTOMY  1997   COLONOSCOPY WITH PROPOFOL  N/A 07/27/2020   Procedure: COLONOSCOPY WITH PROPOFOL ;  Surgeon: Jinny Carmine, MD;  Location: [facility];  Service: Endoscopy;  Laterality: N/A;   DILATION AND CURETTAGE OF UTERUS     11/15/2000   ENDOSCOPIC RETROGRADE CHOLANGIOPANCREATOGRAPHY (ERCP) WITH PROPOFOL  N/A 09/14/2022   Procedure: ENDOSCOPIC RETROGRADE CHOLANGIOPANCREATOGRAPHY (ERCP) WITH PROPOFOL ;  Surgeon: Wilhelmenia Aloha Raddle., MD;  Location: [facility];  Service: Gastroenterology;  Laterality: N/A;   ESOPHAGEAL MANOMETRY N/A 09/20/2015   Procedure: ESOPHAGEAL MANOMETRY (EM);  Surgeon: Elspeth Deward Naval, MD;  Location: [facility];  Service: Gastroenterology;  Laterality: N/A;   ESOPHAGOGASTRODUODENOSCOPY (EGD) WITH PROPOFOL  N/A 05/17/2021   Procedure: ESOPHAGOGASTRODUODENOSCOPY (EGD) WITH PROPOFOL ;  Surgeon: Janalyn Keene NOVAK, MD;  Location: [facility];  Service: Endoscopy;  Laterality: N/A;   ESOPHAGOGASTRODUODENOSCOPY (EGD) WITH PROPOFOL  N/A 07/17/2022   Procedure: ESOPHAGOGASTRODUODENOSCOPY (EGD) WITH PROPOFOL ;  Surgeon: Therisa Bi, MD;  Location: [facility];  Service:  Gastroenterology;  Laterality: N/A;   ESOPHAGOGASTRODUODENOSCOPY (EGD) WITH PROPOFOL  N/A 09/14/2022   Procedure: ESOPHAGOGASTRODUODENOSCOPY (EGD) WITH PROPOFOL ;  Surgeon: Wilhelmenia Aloha Raddle., MD;  Location: [facility];  Service: Gastroenterology;  Laterality: N/A;   EUS N/A 09/14/2022   Procedure: UPPER ENDOSCOPIC ULTRASOUND (EUS) RADIAL;  Surgeon: Wilhelmenia Aloha Raddle., MD;  Location: [facility];  Service: Gastroenterology;  Laterality: N/A;   HYSTEROSCOPY  11/15/2000   D & C, resection of endometrial polyps   LUMBAR SPINAL CORD STIMULATOR LEAD REMOVAL N/A 08/01/2021   Procedure: REMOVAL OF SPINAL CORD STIMULATOR;  Surgeon: Clois Fret, MD;  Location: [facility];  Service: Neurosurgery;  Laterality: N/A;   OOPHORECTOMY  12/04/2000   bilateral salpingo-oophorectomy done with TAH   REMOVAL OF STONES  09/14/2022   Procedure: REMOVAL OF STONES;  Surgeon: Wilhelmenia Aloha Raddle., MD;  Location: [facility];  Service: Gastroenterology;;   ANNETT  09/14/2022   Procedure: ANNETT;  Surgeon: Mansouraty, Aloha Raddle., MD;  Location: [facility];  Service: Gastroenterology;;   SPINAL CORD STIMULATOR INSERTION Bilateral 04/12/2020   Procedure: INSERTION CERVICAL SPINAL STIMULATOR PULSE GENERATOR;  Surgeon: Bluford Elspeth, MD;  Location: [facility];  Service: Neurosurgery;  Laterality: Bilateral;  SPINAL CORD STIMULATOR TRIAL N/A 03/22/2020   Procedure: CERVICAL SPINAL CORD STIMULATOR TRIAL PERCUTANEOUS;  Surgeon: Bluford Standing, MD;  Location: [facility];  Service: Neurosurgery;  Laterality: N/A;   TUBAL LIGATION     Allergies[1]       06/13/2024   11:43 AM 05/23/2024   12:02 PM 11/21/2023   10:27 AM  Depression screen PHQ 2/9  Decreased Interest 1 0 0  Down, Depressed, Hopeless 0 0 0  PHQ - 2 Score 1 0 0  Altered sleeping 0 0 0  Tired, decreased energy 2 0 0  Change in appetite 0 0 0  Feeling bad or failure about yourself  0 0 0  Trouble concentrating 0 0 0   Moving slowly or fidgety/restless 0 0 0  Suicidal thoughts 0 0 0  PHQ-9 Score 3 0 0   Difficult doing work/chores Not difficult at all  Not difficult at all     Data saved with a previous flowsheet row definition       06/13/2024   11:43 AM 05/23/2024   12:03 PM 08/30/2023    2:49 PM 03/09/2023   10:53 AM  GAD 7 : Generalized Anxiety Score  Nervous, Anxious, on Edge 0 0 0 0  Control/stop worrying 0 0 0 0  Worry too much - different things 0 0 0 0  Trouble relaxing 0 0 0 0  Restless 0 0 0 0  Easily annoyed or irritable 0 0 0 0  Afraid - awful might happen 0 0 0 0  Total GAD 7 Score 0 0 0 0  Anxiety Difficulty Not difficult at all   Not difficult at all      Review of Systems  Constitutional:  Negative for chills and fever.  HENT:  Positive for ear pain. Negative for congestion, ear discharge, hearing loss, sinus pain, sore throat and tinnitus.   Respiratory:  Negative for shortness of breath.   Cardiovascular:  Negative for chest pain.  Gastrointestinal:  Negative for abdominal pain, constipation, diarrhea, heartburn, nausea and vomiting.  Genitourinary:  Negative for dysuria, frequency and urgency.  Neurological:  Negative for dizziness and headaches.  Endo/Heme/Allergies:  Negative for polydipsia.  Psychiatric/Behavioral:  Negative for depression and suicidal ideas. The patient is not nervous/anxious.       Objective:     BP 124/80   Pulse 81   Temp 98 F (36.7 C) (Temporal)   Ht 5' 3.5 (1.613 m)   Wt 119 lb (54 kg)   SpO2 96%   BMI 20.75 kg/m  BP Readings from Last 3 Encounters:  06/13/24 124/80  06/06/24 137/87  06/04/24 132/83   Wt Readings from Last 3 Encounters:  06/13/24 119 lb (54 kg)  06/04/24 114 lb (51.7 kg)  05/23/24 118 lb (53.5 kg)      Physical Exam Vitals and nursing note reviewed.  Constitutional:      Appearance: Normal appearance.  Cardiovascular:     Rate and Rhythm: Normal rate and regular rhythm.     Pulses: Normal pulses.      Heart sounds: Normal heart sounds.  Pulmonary:     Effort: Pulmonary effort is normal.     Breath sounds: Normal breath sounds.  Neurological:     Mental Status: She is alert and oriented to person, place, and time.  Psychiatric:        Mood and Affect: Mood normal.        Behavior: Behavior normal.  Thought Content: Thought content normal.        Judgment: Judgment normal.      No results found for any visits on 06/13/24.     The ASCVD Risk score (Arnett DK, et al., 2019) failed to calculate for the following reasons:   The valid total cholesterol range is 130 to 320 mg/dL    Assessment & Plan:  Left ear pain -     Ambulatory referral to ENT  Other fatigue -     CBC with Differential/Platelet -     Comprehensive metabolic panel with GFR -     TSH -     VITAMIN D  25 Hydroxy (Vit-D Deficiency, Fractures)    Assessment and Plan Assessment & Plan Left ear pain Persistent left ear pain unresponsive to amoxicillin . No infection signs. Possible TMJ involvement or non-infectious causes. ENT referral considered. - Referred to ENT for further evaluation. - Continue Flonase  twice daily. - Use Tylenol  for pain management.  Fatigue Fatigue not linked to ear infection. Possible dehydration, nutritional deficiencies, or thyroid  dysfunction. Blood work ordered. - Ordered blood work for dehydration, thyroid  function, and vitamin D  levels. - Advised on hydration and balanced diet.   Return if symptoms worsen or fail to improve.    Carrol Aurora, NP      [1] Allergies Allergen Reactions   Alpha-Gal Anaphylaxis, Hives and Other (See Comments)    Alpha-gal Syndrome (AGS)   Milk (Cow) Anaphylaxis    Alpha-gal Syndrome (AGS)   Milk-Related Compounds Anaphylaxis and Other (See Comments)    Alpha-gal Syndrome (AGS)   Other Anaphylaxis and Other (See Comments)    NO ANIMAL-DERIVED PRODUCTS!! Alpha-gal Syndrome (AGS)   Bactrim  [Sulfamethoxazole -Trimethoprim ] Other  (See Comments)    Mouth ulcers and sores  "

## 2024-06-13 NOTE — Patient Instructions (Addendum)
 Stop by the lab prior to leaving today. I will notify you of your results once received.   Continue tylenol  as needed for pain.  Continue Flonase  nasal spray twice daily.   You will either be contacted via phone regarding your referral to ENT , or you may receive a letter on your MyChart portal from our referral team with instructions for scheduling an appointment. Please let us  know if you have not been contacted by anyone within two weeks.  Follow up if symptoms do not improve.   It was a pleasure to see you today!

## 2024-06-14 ENCOUNTER — Ambulatory Visit: Payer: Self-pay | Admitting: General Practice

## 2024-06-14 LAB — CBC WITH DIFFERENTIAL/PLATELET
Absolute Lymphocytes: 1940 {cells}/uL (ref 850–3900)
Absolute Monocytes: 277 {cells}/uL (ref 200–950)
Basophils Absolute: 0 {cells}/uL (ref 0–200)
Basophils Relative: 0 %
Eosinophils Absolute: 29 {cells}/uL (ref 15–500)
Eosinophils Relative: 0.8 %
HCT: 37.6 % (ref 35.9–46.0)
Hemoglobin: 12.3 g/dL (ref 11.7–15.5)
MCH: 31.9 pg (ref 27.0–33.0)
MCHC: 32.7 g/dL (ref 31.6–35.4)
MCV: 97.4 fL (ref 81.4–101.7)
MPV: 9.9 fL (ref 7.5–12.5)
Monocytes Relative: 7.7 %
Neutro Abs: 1354 {cells}/uL — ABNORMAL LOW (ref 1500–7800)
Neutrophils Relative %: 37.6 %
Platelets: 177 Thousand/uL (ref 140–400)
RBC: 3.86 Million/uL (ref 3.80–5.10)
RDW: 13.1 % (ref 11.0–15.0)
Total Lymphocyte: 53.9 %
WBC: 3.6 Thousand/uL — ABNORMAL LOW (ref 3.8–10.8)

## 2024-06-14 LAB — COMPREHENSIVE METABOLIC PANEL WITH GFR
AG Ratio: 1.6 (calc) (ref 1.0–2.5)
ALT: 11 U/L (ref 6–29)
AST: 19 U/L (ref 10–35)
Albumin: 4.1 g/dL (ref 3.6–5.1)
Alkaline phosphatase (APISO): 70 U/L (ref 37–153)
BUN: 9 mg/dL (ref 7–25)
CO2: 26 mmol/L (ref 20–32)
Calcium: 9.2 mg/dL (ref 8.6–10.4)
Chloride: 106 mmol/L (ref 98–110)
Creat: 0.69 mg/dL (ref 0.60–1.00)
Globulin: 2.6 g/dL (ref 1.9–3.7)
Glucose, Bld: 99 mg/dL (ref 65–99)
Potassium: 3.7 mmol/L (ref 3.5–5.3)
Sodium: 140 mmol/L (ref 135–146)
Total Bilirubin: 0.5 mg/dL (ref 0.2–1.2)
Total Protein: 6.7 g/dL (ref 6.1–8.1)
eGFR: 93 mL/min/1.73m2

## 2024-06-14 LAB — TSH: TSH: 2.38 m[IU]/L (ref 0.40–4.50)

## 2024-06-14 LAB — VITAMIN D 25 HYDROXY (VIT D DEFICIENCY, FRACTURES): Vit D, 25-Hydroxy: 40 ng/mL (ref 30–100)

## 2024-06-16 ENCOUNTER — Ambulatory Visit: Payer: Self-pay

## 2024-06-16 NOTE — Telephone Encounter (Signed)
 Noted.

## 2024-06-16 NOTE — Telephone Encounter (Signed)
 FYI Only or Action Required?: FYI only for provider: information only.  Patient was last seen in primary care on 06/13/2024 by Vincente Shivers, NP.  Called Nurse Triage reporting Advice Only.  Symptoms began information only.  Interventions attempted: Other: information only.  Symptoms are: information only.  Triage Disposition: Information or Advice Only Call  Patient/caregiver understands and will follow disposition?: Yes                               Reason for Disposition  Health information question, no triage required and triager able to answer question  Answer Assessment - Initial Assessment Questions 1. REASON FOR CALL: What is the main reason for your call? or How can I best help you?     Patient had additional questions regarding her lab results. This RN read provider's result note to patient. Patient has no additional questions at this time.  Protocols used: Information Only Call - No Triage-A-AH  Copied from CRM [id number]. Topic: Clinical - Lab/Test Results >> Jun 16, 2024 12:30 PM Suzen RAMAN wrote: Reason for CRM: Patient has additional questions about her most recent lab results.

## 2024-06-24 ENCOUNTER — Encounter (INDEPENDENT_AMBULATORY_CARE_PROVIDER_SITE_OTHER): Payer: Self-pay | Admitting: Otolaryngology

## 2024-06-24 ENCOUNTER — Ambulatory Visit (INDEPENDENT_AMBULATORY_CARE_PROVIDER_SITE_OTHER): Admitting: Otolaryngology

## 2024-06-24 VITALS — BP 123/82 | HR 85 | Ht 63.5 in | Wt 117.0 lb

## 2024-06-24 DIAGNOSIS — H9202 Otalgia, left ear: Secondary | ICD-10-CM | POA: Diagnosis not present

## 2024-06-24 NOTE — Progress Notes (Signed)
 Reason for Consult: Ear pain Referring Physician: Dr. Gretta America Valerie [name] is an 74 y.o. female.  HPI: She is here for evaluation of about 3 weeks of left ear pain.  She did not have an upper respiratory infection when it started.  She has not had any drainage.  Her hearing is equal bilaterally with no change.  No tinnitus or vertigo.  She has not had this problem previously.  Nothing seems to exacerbate the pain to her knowledge.  She has no dysphagia or odynophagia.  No hoarseness.  No soreness of her throat.  Past Medical History:  Diagnosis Date   Acid reflux    Acute cystitis with hematuria 12/08/2020   Acute cystitis without hematuria 01/10/2023   Adenocarcinoma of the endometrium/uterus (HCC) 12/04/2000   Anal fissure    Anxiety    Atrophic vaginitis    COVID-19 virus infection 09/28/2020   Dehydration    Difficult intubation    limited neck mobility   Dysuria 08/13/2020   Esophagitis    Hernia    Present to proximal to umbillical    History of hiatal hernia    Hyperlipidemia    Hypertension    Hypokalemia 09/15/2022   Internal hemorrhoids    Intractable abdominal pain 09/15/2022   Lactic acidosis    Menorrhagia    Osteopenia    Partial small bowel obstruction (HCC) 01/26/2021   PONV (postoperative nausea and vomiting)    Recurrent UTI    Vertigo    Vitamin D  deficiency     Past Surgical History:  Procedure Laterality Date   61 HOUR PH STUDY N/A 11/29/2015   Procedure: 24 HOUR PH STUDY;  Surgeon: Elspeth Deward Naval, MD;  Location: [facility];  Service: Gastroenterology;  Laterality: N/A;   ABDOMINAL HYSTERECTOMY  12/04/2000   also bilateral salpingo-oophorectomy   CARDIAC CATHETERIZATION  2006   CARPAL TUNNEL RELEASE  11/20/2011   Procedure: CARPAL TUNNEL RELEASE;  Surgeon: Arley SHAUNNA Helling, MD;  Location: [facility];  Service: Neurosurgery;  Laterality: Left;  LEFT carpal tunnel release   CERVICAL SPINE SURGERY  2013   Cram   CHOLECYSTECTOMY  1997    COLONOSCOPY WITH PROPOFOL  N/A 07/27/2020   Procedure: COLONOSCOPY WITH PROPOFOL ;  Surgeon: Jinny Carmine, MD;  Location: [facility];  Service: Endoscopy;  Laterality: N/A;   DILATION AND CURETTAGE OF UTERUS     11/15/2000   ENDOSCOPIC RETROGRADE CHOLANGIOPANCREATOGRAPHY (ERCP) WITH PROPOFOL  N/A 09/14/2022   Procedure: ENDOSCOPIC RETROGRADE CHOLANGIOPANCREATOGRAPHY (ERCP) WITH PROPOFOL ;  Surgeon: Wilhelmenia Aloha Raddle., MD;  Location: [facility];  Service: Gastroenterology;  Laterality: N/A;   ESOPHAGEAL MANOMETRY N/A 09/20/2015   Procedure: ESOPHAGEAL MANOMETRY (EM);  Surgeon: Elspeth Deward Naval, MD;  Location: [facility];  Service: Gastroenterology;  Laterality: N/A;   ESOPHAGOGASTRODUODENOSCOPY (EGD) WITH PROPOFOL  N/A 05/17/2021   Procedure: ESOPHAGOGASTRODUODENOSCOPY (EGD) WITH PROPOFOL ;  Surgeon: Janalyn Keene NOVAK, MD;  Location: [facility];  Service: Endoscopy;  Laterality: N/A;   ESOPHAGOGASTRODUODENOSCOPY (EGD) WITH PROPOFOL  N/A 07/17/2022   Procedure: ESOPHAGOGASTRODUODENOSCOPY (EGD) WITH PROPOFOL ;  Surgeon: Therisa Bi, MD;  Location: [facility];  Service: Gastroenterology;  Laterality: N/A;   ESOPHAGOGASTRODUODENOSCOPY (EGD) WITH PROPOFOL  N/A 09/14/2022   Procedure: ESOPHAGOGASTRODUODENOSCOPY (EGD) WITH PROPOFOL ;  Surgeon: Wilhelmenia Aloha Raddle., MD;  Location: [facility];  Service: Gastroenterology;  Laterality: N/A;   EUS N/A 09/14/2022   Procedure: UPPER ENDOSCOPIC ULTRASOUND (EUS) RADIAL;  Surgeon: Wilhelmenia Aloha Raddle., MD;  Location: [facility];  Service: Gastroenterology;  Laterality: N/A;   HYSTEROSCOPY  11/15/2000  D & C, resection of endometrial polyps   LUMBAR SPINAL CORD STIMULATOR LEAD REMOVAL N/A 08/01/2021   Procedure: REMOVAL OF SPINAL CORD STIMULATOR;  Surgeon: Clois Fret, MD;  Location: [facility];  Service: Neurosurgery;  Laterality: N/A;   OOPHORECTOMY  12/04/2000   bilateral salpingo-oophorectomy done with TAH   REMOVAL OF STONES   09/14/2022   Procedure: REMOVAL OF STONES;  Surgeon: Wilhelmenia Aloha Raddle., MD;  Location: [facility];  Service: Gastroenterology;;   ANNETT  09/14/2022   Procedure: ANNETT;  Surgeon: Mansouraty, Aloha Raddle., MD;  Location: [facility];  Service: Gastroenterology;;   SPINAL CORD STIMULATOR INSERTION Bilateral 04/12/2020   Procedure: INSERTION CERVICAL SPINAL STIMULATOR PULSE GENERATOR;  Surgeon: Bluford Standing, MD;  Location: [facility];  Service: Neurosurgery;  Laterality: Bilateral;   SPINAL CORD STIMULATOR TRIAL N/A 03/22/2020   Procedure: CERVICAL SPINAL CORD STIMULATOR TRIAL PERCUTANEOUS;  Surgeon: Bluford Standing, MD;  Location: [facility];  Service: Neurosurgery;  Laterality: N/A;   TUBAL LIGATION      Family History  Problem Relation Age of Onset   Hypertension Father    Prostate cancer Father    Lung cancer Sister    Diabetes Sister    Diabetes Brother    Heart disease Brother    Hypertension Brother    Diabetes Brother    Heart disease Brother    Lung cancer Brother    Heart attack Brother    Hypertension Son    Other Son        spots on brain   Autoimmune disease Daughter    Breast cancer Neg Hx     Social History:  reports that she has never smoked. She has never been exposed to tobacco smoke. She has never used smokeless tobacco. She reports that she does not drink alcohol and does not use drugs.  Allergies: Allergies[1]   No results found for this or any previous visit (from the past 48 hours).  No results found.  ROS Blood pressure 123/82, pulse 85, height 5' 3.5 (1.613 m), weight 117 lb (53.1 kg), SpO2 96%. Physical Exam Constitutional:      Appearance: Normal appearance.  HENT:     Head: Normocephalic and atraumatic.     Right Ear: Tympanic membrane is without lesions and middle ear aerated, ear canal and external ear normal.     Left Ear: Tympanic membrane is without lesions and middle ear aerated, ear canal and external ear normal.      Nose: Nose without deviation of septum.  Turbinates with mild hypertrophy, No significant swelling or masses.  She has definite pain in the left temporomandibular joint to palpation and movement.    Oral cavity/oropharynx: Mucous membranes are moist. No lesions or masses    Larynx: normal voice. Mirror no evidence of lesions    Eyes:     Extraocular Movements: Extraocular movements intact.     Conjunctiva/sclera: Conjunctivae normal.     Pupils: Pupils are equal, round, and reactive to light.  Cardiovascular:     Rate and Rhythm: Normal rate.  Pulmonary:     Effort: Pulmonary effort is normal.  Musculoskeletal:     Cervical back: Normal range of motion and neck supple. No rigidity.  Lymphadenopathy:     Cervical: No cervical adenopathy or masses.salivary glands without lesions. .     Salivary glands- no mass or swelling Neurological:     Mental Status: He is alert. CN 2-12 intact. No nystagmus      Assessment/Plan: Left ear pain-this  is most certainly temporomandibular joint dysfunction.  We talked about referred otalgia and that if she has any sore throat symptoms or persistent pain or swallowing issues she could come back immediately if she still having the left ear pain.  This is for reference only as I do not see any lesions and she does not have any other symptoms currently.  She will see her dentist which is Dr. Grafton who is an oral surgeon regarding this TMJ and if the problem persist she can follow-up with me particularly if the ear pain persists.  Valerie [name] 06/24/2024, 2:08 PM         [1]  Allergies Allergen Reactions   Alpha-Gal Anaphylaxis, Hives and Other (See Comments)    Alpha-gal Syndrome (AGS)   Milk (Cow) Anaphylaxis    Alpha-gal Syndrome (AGS)   Milk-Related Compounds Anaphylaxis and Other (See Comments)    Alpha-gal Syndrome (AGS)   Other Anaphylaxis and Other (See Comments)    NO ANIMAL-DERIVED PRODUCTS!! Alpha-gal Syndrome (AGS)   Bactrim   [Sulfamethoxazole -Trimethoprim ] Other (See Comments)    Mouth ulcers and sores

## 2024-06-25 ENCOUNTER — Ambulatory Visit: Admitting: Urology

## 2024-06-25 VITALS — BP 125/78 | HR 85 | Ht 63.0 in | Wt 117.0 lb

## 2024-06-25 DIAGNOSIS — R310 Gross hematuria: Secondary | ICD-10-CM | POA: Diagnosis not present

## 2024-06-25 MED ORDER — NITROFURANTOIN MONOHYD MACRO 100 MG PO CAPS: 100.0000 mg | ORAL_CAPSULE | Freq: Two times a day (BID) | ORAL | 0 refills | Status: DC

## 2024-06-25 MED ORDER — LIDOCAINE HCL URETHRAL/MUCOSAL 2 % EX GEL
1.0000 | Freq: Once | CUTANEOUS | Status: AC
  Administered 2024-06-25: 1 via URETHRAL

## 2024-06-25 MED ORDER — NITROFURANTOIN MONOHYD MACRO 100 MG PO CAPS
100.0000 mg | ORAL_CAPSULE | Freq: Once | ORAL | Status: AC
  Administered 2024-06-25: 100 mg via ORAL

## 2024-06-25 NOTE — Progress Notes (Signed)
 Cystoscopy Procedure Note:  Indication: Microscopic hematuria, recurrent UTI  Nitrofurantoin  given for prophylaxis  After informed consent and discussion of the procedure and its risks, SHAKILA MAK was positioned and prepped in the standard fashion. Cystoscopy was performed with a flexible cystoscope. The urethra, bladder neck and entire bladder was visualized in a standard fashion.  The bladder was grossly normal throughout with no abnormalities, no abnormalities on retroflexion  Imaging: CT abdomen pelvis with contrast July 2025 benign  Findings: Normal cystoscopy  Assessment and Plan: Follow-up with [facility] for ongoing urinary symptoms/OAB  Redell Burnet, MD 06/25/2024

## 2024-06-26 ENCOUNTER — Institutional Professional Consult (permissible substitution) (INDEPENDENT_AMBULATORY_CARE_PROVIDER_SITE_OTHER): Admitting: Physician Assistant

## 2024-07-01 ENCOUNTER — Emergency Department: Admission: EM | Admit: 2024-07-01 | Discharge: 2024-07-01 | Disposition: A | Discharge status: Home / Self Care

## 2024-07-01 ENCOUNTER — Emergency Department

## 2024-07-01 ENCOUNTER — Other Ambulatory Visit: Payer: Self-pay

## 2024-07-01 DIAGNOSIS — R112 Nausea with vomiting, unspecified: Secondary | ICD-10-CM | POA: Insufficient documentation

## 2024-07-01 DIAGNOSIS — Z8542 Personal history of malignant neoplasm of other parts of uterus: Secondary | ICD-10-CM | POA: Insufficient documentation

## 2024-07-01 DIAGNOSIS — R350 Frequency of micturition: Secondary | ICD-10-CM | POA: Insufficient documentation

## 2024-07-01 DIAGNOSIS — R1084 Generalized abdominal pain: Secondary | ICD-10-CM | POA: Insufficient documentation

## 2024-07-01 DIAGNOSIS — I10 Essential (primary) hypertension: Secondary | ICD-10-CM | POA: Insufficient documentation

## 2024-07-01 DIAGNOSIS — R3915 Urgency of urination: Secondary | ICD-10-CM | POA: Insufficient documentation

## 2024-07-01 DIAGNOSIS — N3000 Acute cystitis without hematuria: Secondary | ICD-10-CM

## 2024-07-01 DIAGNOSIS — R300 Dysuria: Secondary | ICD-10-CM | POA: Insufficient documentation

## 2024-07-01 LAB — COMPREHENSIVE METABOLIC PANEL WITH GFR
ALT: 14 U/L (ref 0–44)
AST: 23 U/L (ref 15–41)
Albumin: 4.3 g/dL (ref 3.5–5.0)
Alkaline Phosphatase: 80 U/L (ref 38–126)
Anion gap: 11 (ref 5–15)
BUN: 9 mg/dL (ref 8–23)
CO2: 27 mmol/L (ref 22–32)
Calcium: 9.6 mg/dL (ref 8.9–10.3)
Chloride: 106 mmol/L (ref 98–111)
Creatinine, Ser: 0.83 mg/dL (ref 0.44–1.00)
GFR, Estimated: >60 mL/min
Glucose, Bld: 112 mg/dL — ABNORMAL HIGH (ref 70–99)
Potassium: 3.9 mmol/L (ref 3.5–5.1)
Sodium: 144 mmol/L (ref 135–145)
Total Bilirubin: 0.5 mg/dL (ref 0.0–1.2)
Total Protein: 7.2 g/dL (ref 6.5–8.1)

## 2024-07-01 LAB — CBC
HCT: 38.2 % (ref 36.0–46.0)
Hemoglobin: 12.8 g/dL (ref 12.0–15.0)
MCH: 32.2 pg (ref 26.0–34.0)
MCHC: 33.5 g/dL (ref 30.0–36.0)
MCV: 96.2 fL (ref 80.0–100.0)
Platelets: 195 10*3/uL (ref 150–400)
RBC: 3.97 MIL/uL (ref 3.87–5.11)
RDW: 13.2 % (ref 11.5–15.5)
WBC: 4.4 10*3/uL (ref 4.0–10.5)
nRBC: 0 % (ref 0.0–0.2)

## 2024-07-01 LAB — URINALYSIS, ROUTINE W REFLEX MICROSCOPIC
Bacteria, UA: NONE SEEN
Bilirubin Urine: NEGATIVE
Glucose, UA: NEGATIVE mg/dL
Ketones, ur: NEGATIVE mg/dL
Nitrite: NEGATIVE
Protein, ur: NEGATIVE mg/dL
Specific Gravity, Urine: 1.005 (ref 1.005–1.030)
pH: 6 (ref 5.0–8.0)

## 2024-07-01 LAB — LIPASE, BLOOD: Lipase: 69 U/L — ABNORMAL HIGH (ref 11–51)

## 2024-07-01 MED ORDER — IOHEXOL 300 MG/ML  SOLN
100.0000 mL | Freq: Once | INTRAMUSCULAR | Status: AC | PRN
  Administered 2024-07-01: 100 mL via INTRAVENOUS

## 2024-07-01 MED ORDER — MORPHINE SULFATE (PF) 4 MG/ML IV SOLN
4.0000 mg | Freq: Once | INTRAVENOUS | Status: AC
  Administered 2024-07-01: 4 mg via INTRAVENOUS
  Filled 2024-07-01: qty 1

## 2024-07-01 MED ORDER — SODIUM CHLORIDE 0.9 % IV BOLUS
1000.0000 mL | Freq: Once | INTRAVENOUS | Status: AC
  Administered 2024-07-01: 1000 mL via INTRAVENOUS

## 2024-07-01 MED ORDER — ONDANSETRON HCL 4 MG/2ML IJ SOLN
4.0000 mg | Freq: Once | INTRAMUSCULAR | Status: AC
  Administered 2024-07-01: 4 mg via INTRAVENOUS
  Filled 2024-07-01: qty 2

## 2024-07-01 MED ORDER — CEPHALEXIN 500 MG PO CAPS: 500.0000 mg | ORAL_CAPSULE | Freq: Two times a day (BID) | ORAL | 0 refills | Status: AC

## 2024-07-01 MED ORDER — CEPHALEXIN 500 MG PO CAPS
500.0000 mg | ORAL_CAPSULE | Freq: Once | ORAL | Status: AC
  Administered 2024-07-01: 500 mg via ORAL
  Filled 2024-07-01: qty 1

## 2024-07-01 NOTE — Discharge Instructions (Signed)
 You were seen today due to concern of abdominal pain.  At this time fortunately your imaging and labs are reassuring.  I would recommend following up with your primary doctor to discuss further management of your symptoms.  I have started you upon antibiotics out of an abundance of precaution to cover for a urinary infection, please take these with meals as instructed.  If you have any worsening of symptom such as fevers, weakness, increased abdominal pain, or any other symptoms you find concerning please return to the emergency department immediately for further medical management.

## 2024-07-01 NOTE — ED Triage Notes (Signed)
 Pt comes in via pov with complaints of abdominal pain for about a month or so. Pt complains of intermittent nausea and vomiting, and lower abdominal pain 6/10. Pt is alert and oriented x4.

## 2024-07-01 NOTE — ED Provider Notes (Signed)
 "  [facility] Provider Note    Event Date/Time   First MD Initiated Contact with Patient 07/01/24 1314     (approximate)   History   Abdominal Pain   HPI  Valerie [name] is a 74 y.o. 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  Patient endorses abdominal pain that has been progressively worsening over the past month.  Intermittent episodes of nausea and vomiting.  States the pain is all over and now radiates into her the middle of her abdomen.  Does endorse weight loss but states that has been ongoing for multiple years.  No fever or chills.  Does endorse dysuria with urinary urgency and frequency.  History of frequent urinary tract infections.  Prior cholecystectomy.  Prior total hysterectomy.     Physical Exam   Triage Vital Signs: ED Triage Vitals  Encounter Vitals Group     BP 07/01/24 1157 136/80     Girls Systolic BP Percentile --      Girls Diastolic BP Percentile --      Boys Systolic BP Percentile --      Boys Diastolic BP Percentile --      Pulse Rate 07/01/24 1157 71     Resp 07/01/24 1157 14     Temp 07/01/24 1157 98.4 F (36.9 C)     Temp Source 07/01/24 1157 Oral     SpO2 07/01/24 1157 97 %     Weight 07/01/24 1158 117 lb (53.1 kg)     Height 07/01/24 1158 5' 3 (1.6 m)     Head Circumference --      Peak Flow --      Pain Score 07/01/24 1157 6     Pain Loc --      Pain Education --      Exclude from Growth Chart --     Most recent vital signs: Vitals:   07/01/24 1157  BP: 136/80  Pulse: 71  Resp: 14  Temp: 98.4 F (36.9 C)  SpO2: 97%    Physical Exam Constitutional:      Appearance: She is well-developed.  HENT:     Head: Atraumatic.  Eyes:     Conjunctiva/sclera: Conjunctivae normal.  Cardiovascular:     Rate  and Rhythm: Regular rhythm.  Pulmonary:     Effort: No respiratory distress.  Abdominal:     General: There is no distension.     Tenderness: There is generalized abdominal tenderness. There is no guarding or rebound.  Musculoskeletal:        General: Normal range of motion.     Cervical back: Normal range of motion.  Skin:    General: Skin is warm.  Neurological:     Mental Status: She is alert. Mental status is at baseline.     IMPRESSION / MDM / ASSESSMENT AND PLAN / ED COURSE  I reviewed the triage vital signs and the nursing notes.  Differential diagnosis including intra-abdominal abscess, small bowel obstruction, gastritis/PUD, hiatal hernia, appendicitis, urinary tract infection, pyelonephritis     RADIOLOGY CT scan abdomen and pelvis with contrast ordered LABS (all labs ordered are listed, but only abnormal results are displayed) Labs interpreted as -    Labs Reviewed  LIPASE, BLOOD - Abnormal; Notable for the following components:      Result Value   Lipase 69 (*)  All other components within normal limits  COMPREHENSIVE METABOLIC PANEL WITH GFR - Abnormal; Notable for the following components:   Glucose, Bld 112 (*)    All other components within normal limits  CBC  URINALYSIS, ROUTINE W REFLEX MICROSCOPIC     MDM  No significant leukocytosis.  No significant anemia.  Creatinine at baseline.  No significant electrolyte abnormality.  Lipase only mildly elevated at 69.  UA pending.  Given IV fluids, antiemetics and pain medication.  CT scan abdomen and pelvis and UA currently pending.  Care transferred to incoming provider.     PROCEDURES:  Critical Care performed: No  Procedures  Patient's presentation is most consistent with acute presentation with potential threat to life or bodily function.   MEDICATIONS ORDERED IN ED: Medications  ondansetron  (ZOFRAN ) injection 4 mg (has no administration in time range)  morphine  (PF) 4 MG/ML injection 4  mg (has no administration in time range)  sodium chloride  0.9 % bolus 1,000 mL (1,000 mLs Intravenous New Bag/Given 07/01/24 1512)    FINAL CLINICAL IMPRESSION(S) / ED DIAGNOSES   Final diagnoses:  Generalized abdominal pain     Rx / DC Orders   ED Discharge Orders     None        Note:  This document was prepared using Dragon voice recognition software and may include unintentional dictation errors.   Suzanne Kirsch, MD 07/01/24 1513  "

## 2024-07-02 ENCOUNTER — Other Ambulatory Visit: Payer: Self-pay

## 2024-07-02 ENCOUNTER — Emergency Department (HOSPITAL_COMMUNITY)
Admission: EM | Admit: 2024-07-02 | Discharge: 2024-07-03 | Disposition: A | Source: Home / Self Care | Attending: Emergency Medicine | Admitting: Emergency Medicine

## 2024-07-02 ENCOUNTER — Encounter (HOSPITAL_COMMUNITY): Payer: Self-pay

## 2024-07-02 DIAGNOSIS — M549 Dorsalgia, unspecified: Secondary | ICD-10-CM

## 2024-07-02 DIAGNOSIS — M542 Cervicalgia: Secondary | ICD-10-CM

## 2024-07-02 DIAGNOSIS — R519 Headache, unspecified: Secondary | ICD-10-CM

## 2024-07-02 DIAGNOSIS — R29898 Other symptoms and signs involving the musculoskeletal system: Secondary | ICD-10-CM

## 2024-07-02 LAB — URINALYSIS, ROUTINE W REFLEX MICROSCOPIC
Bacteria, UA: NONE SEEN
Bilirubin Urine: NEGATIVE
Glucose, UA: NEGATIVE mg/dL
Ketones, ur: NEGATIVE mg/dL
Nitrite: NEGATIVE
Protein, ur: NEGATIVE mg/dL
Specific Gravity, Urine: 1.005 (ref 1.005–1.030)
pH: 5 (ref 5.0–8.0)

## 2024-07-02 LAB — COMPREHENSIVE METABOLIC PANEL WITH GFR
ALT: 12 U/L (ref 0–44)
AST: 22 U/L (ref 15–41)
Albumin: 4 g/dL (ref 3.5–5.0)
Alkaline Phosphatase: 74 U/L (ref 38–126)
Anion gap: 9 (ref 5–15)
BUN: 10 mg/dL (ref 8–23)
CO2: 25 mmol/L (ref 22–32)
Calcium: 9.4 mg/dL (ref 8.9–10.3)
Chloride: 104 mmol/L (ref 98–111)
Creatinine, Ser: 0.81 mg/dL (ref 0.44–1.00)
GFR, Estimated: >60 mL/min
Glucose, Bld: 101 mg/dL — ABNORMAL HIGH (ref 70–99)
Potassium: 4 mmol/L (ref 3.5–5.1)
Sodium: 138 mmol/L (ref 135–145)
Total Bilirubin: 0.4 mg/dL (ref 0.0–1.2)
Total Protein: 6.8 g/dL (ref 6.5–8.1)

## 2024-07-02 LAB — CBC
HCT: 36.4 % (ref 36.0–46.0)
Hemoglobin: 12.2 g/dL (ref 12.0–15.0)
MCH: 32.6 pg (ref 26.0–34.0)
MCHC: 33.5 g/dL (ref 30.0–36.0)
MCV: 97.3 fL (ref 80.0–100.0)
Platelets: 172 10*3/uL (ref 150–400)
RBC: 3.74 MIL/uL — ABNORMAL LOW (ref 3.87–5.11)
RDW: 13.3 % (ref 11.5–15.5)
WBC: 4.5 10*3/uL (ref 4.0–10.5)
nRBC: 0 % (ref 0.0–0.2)

## 2024-07-02 LAB — CBG MONITORING, ED: Glucose-Capillary: 100 mg/dL — ABNORMAL HIGH (ref 70–99)

## 2024-07-02 NOTE — ED Triage Notes (Signed)
 Pt reports that she has had bilateral leg weakness x 3 days. Pt also reports headache that started today. Denies any other neuro deficits at this time.

## 2024-07-03 ENCOUNTER — Emergency Department (HOSPITAL_COMMUNITY)

## 2024-07-03 LAB — TROPONIN T, HIGH SENSITIVITY
Troponin T High Sensitivity: 8 ng/L (ref 0–19)
Troponin T High Sensitivity: 9 ng/L (ref 0–19)

## 2024-07-03 MED ORDER — LACTATED RINGERS IV SOLN: INTRAVENOUS | Status: DC

## 2024-07-03 MED ORDER — LORAZEPAM 2 MG/ML IJ SOLN
0.5000 mg | Freq: Once | INTRAMUSCULAR | Status: AC
  Administered 2024-07-03: 0.5 mg via INTRAVENOUS
  Filled 2024-07-03: qty 1

## 2024-07-03 MED ORDER — GADOBUTROL 1 MMOL/ML IV SOLN
5.0000 mL | Freq: Once | INTRAVENOUS | Status: AC | PRN
  Administered 2024-07-03: 5 mL via INTRAVENOUS

## 2024-07-03 NOTE — ED Notes (Signed)
 Patient transported to MRI

## 2024-07-03 NOTE — ED Provider Notes (Signed)
 " [place] EMERGENCY DEPARTMENT AT [facility] Provider Note   CSN: [id number] Arrival date & time: 07/02/24  2127     Patient presents with: Weakness and Headache   Valerie [name] is a 74 y.o. female.  {Add pertinent medical, surgical, social history, OB history to HPI:32947} HPI      74 year old female with a history of hypertension, hyperlipidemia, partial small bowel obstruction, adenocarcinoma of the endometrium, recurrent UTIs, recent evaluation in the emergency department with concern for abdominal pain   CT abdomen pelvis performed February 3 shows no acute abnormalities.  She was treated for UTI given her symptoms with cephalexin .    Past Medical History:  Diagnosis Date   Acid reflux    Acute cystitis with hematuria 12/08/2020   Acute cystitis without hematuria 01/10/2023   Adenocarcinoma of the endometrium/uterus (HCC) 12/04/2000   Anal fissure    Anxiety    Atrophic vaginitis    COVID-19 virus infection 09/28/2020   Dehydration    Difficult intubation    limited neck mobility   Dysuria 08/13/2020   Esophagitis    Hernia    Present to proximal to umbillical    History of hiatal hernia    Hyperlipidemia    Hypertension    Hypokalemia 09/15/2022   Internal hemorrhoids    Intractable abdominal pain 09/15/2022   Lactic acidosis    Menorrhagia    Osteopenia    Partial small bowel obstruction (HCC) 01/26/2021   PONV (postoperative nausea and vomiting)    Recurrent UTI    Vertigo    Vitamin D  deficiency     Prior to Admission medications  Medication Sig Start Date End Date Taking? Authorizing Provider  acetaminophen  (TYLENOL ) 325 MG tablet Take 325 mg by mouth 2 (two) times daily as needed (pain or headaches).    [provider]  amitriptyline  (ELAVIL ) 10 MG tablet Take 1 tablet (10 mg total) by mouth at bedtime. 03/25/24   Cirigliano, Vito V, DO  amLODipine  (NORVASC ) 5 MG tablet TAKE 1 TABLET EVERY DAY FOR BLOOD PRESSURE 03/05/24    Clark, Katherine K, NP  atorvastatin  (LIPITOR) 10 MG tablet TAKE 1 TABLET DAILY FOR CHOLESTEROL 01/07/24   Clark, Katherine K, NP  azelastine  (ASTELIN ) 0.1 % nasal spray Place 1 spray into both nostrils 2 (two) times daily. Use in each nostril as directed 11/14/22   Tobie Arleta SQUIBB, MD  CALCIUM -VITAMIN D  PO Take 2 tablets by mouth daily.    [provider]  cephALEXin  (KEFLEX ) 500 MG capsule Take 1 capsule (500 mg total) by mouth 2 (two) times daily for 5 days. 07/01/24 07/06/24  Fernand Rossie HERO, MD  cetirizine  (ZYRTEC  ALLERGY ) 10 MG tablet Take 1 tablet (10 mg total) by mouth daily. 11/14/22   Tobie Arleta SQUIBB, MD  ciprofloxacin  (CIPRO ) 500 MG tablet Take 1 tablet (500 mg total) by mouth every 12 (twelve) hours. 02/22/24   Francisca Redell BROCKS, MD  cyclobenzaprine  (FLEXERIL ) 5 MG tablet Take 1 tablet (5 mg total) by mouth at bedtime as needed for muscle spasms. 04/06/23   Clark, Katherine K, NP  dicyclomine  (BENTYL ) 10 MG capsule Take 1 capsule (10 mg total) by mouth 3 (three) times daily before meals. 04/21/24   Cirigliano, Vito V, DO  EPINEPHrine  0.3 mg/0.3 mL IJ SOAJ injection Inject 0.3 mg into the muscle as directed. 11/14/22   Tobie Arleta SQUIBB, MD  fluticasone  (FLONASE ) 50 MCG/ACT nasal spray USE 2 SPRAYS IN EACH NOSTRIL EVERY DAY AS NEEDED FOR  ALLERGIES OR RHINITIS 01/29/24   Gretta Comer POUR, NP  HYDROcodone  bit-homatropine (HYCODAN) 5-1.5 MG/5ML syrup Take 5 mLs by mouth every 8 (eight) hours as needed for cough. 05/26/24   Wendee Lynwood HERO, NP  metoprolol  succinate (TOPROL -XL) 50 MG 24 hr tablet Take 25 mg by mouth as needed. 10/15/23   [provider]  mupirocin  ointment (BACTROBAN ) 2 % Apply 1 Application topically 2 (two) times daily. 02/12/23   Clark, Katherine K, NP  omeprazole  (PRILOSEC) 40 MG capsule Take 1 capsule (40 mg total) by mouth 2 (two) times daily. TAKE 1 CAPSULE TWICE DAILY FOR HEARTBURN 07/31/23 07/25/24  [place], PA-C  ondansetron  (ZOFRAN -ODT) 8 MG disintegrating tablet  Take 1 tablet (8 mg total) by mouth 3 (three) times daily as needed. 07/31/23 07/25/24  [place], PA-C  prochlorperazine  (COMPAZINE ) 5 MG tablet Take 1 tablet (5 mg total) by mouth every 12 (twelve) hours as needed for nausea or vomiting. 11/06/23   May, Deanna J, NP  Prucalopride Succinate  2 MG TABS Take 1 tablet (2 mg total) by mouth daily. 03/25/24   Cirigliano, Vito V, DO  sucralfate  (CARAFATE ) 1 g tablet TAKE 1 TABLET THREE TIMES DAILY BEFORE MEALS 03/31/24   Collier, Amanda R, PA-C  topiramate  (TOPAMAX ) 50 MG tablet Take 1 tablet (50 mg total) by mouth daily. For headache prevention 04/04/22   Clark, Katherine K, NP  traMADol  (ULTRAM ) 50 MG tablet Take 50 mg by mouth 3 (three) times daily as needed. 08/29/23   [provider]  triamcinolone  (KENALOG ) 0.1 % paste Use as directed 1 Application in the mouth or throat 2 (two) times daily. 03/01/23   Clark, Katherine K, NP  valACYclovir  (VALTREX ) 1000 MG tablet Take 2 tablets twice daily for 1 day. 02/08/23   Clark, Katherine K, NP  valsartan  (DIOVAN ) 160 MG tablet Take 160 mg by mouth daily. 04/09/23   [provider]    Allergies: Alpha-gal, Milk (cow), Milk-related compounds, Other, and Bactrim  [sulfamethoxazole -trimethoprim ]    Review of Systems  Updated Vital Signs BP 135/70 (BP Location: Right Arm)   Pulse (!) 58   Temp 98 F (36.7 C)   Resp 16   SpO2 98%   Physical Exam  (all labs ordered are listed, but only abnormal results are displayed) Labs Reviewed  COMPREHENSIVE METABOLIC PANEL WITH GFR - Abnormal; Notable for the following components:      Result Value   Glucose, Bld 101 (*)    All other components within normal limits  CBC - Abnormal; Notable for the following components:   RBC 3.74 (*)    All other components within normal limits  URINALYSIS, ROUTINE W REFLEX MICROSCOPIC - Abnormal; Notable for the following components:   Color, Urine STRAW (*)    Hgb urine dipstick MODERATE (*)    Leukocytes,Ua  TRACE (*)    All other components within normal limits  CBG MONITORING, ED - Abnormal; Notable for the following components:   Glucose-Capillary 100 (*)    All other components within normal limits    EKG: None  Radiology: CT ABDOMEN PELVIS W CONTRAST Result Date: 07/01/2024 EXAM: CT ABDOMEN AND PELVIS WITH CONTRAST 07/01/2024 04:35:08 PM TECHNIQUE: CT of the abdomen and pelvis was performed with the administration of intravenous contrast. Multiplanar reformatted images are provided for review. Automated exposure control, iterative reconstruction, and/or weight-based adjustment of the mA/kV was utilized to reduce the radiation dose to as low as reasonably achievable. 100 mL of iohexol  (OMNIPAQUE ) 300 MG/ML solution was  administered. COMPARISON: 12/03/2023 CLINICAL HISTORY: Abdominal pain, acute, nonlocalized. FINDINGS: LOWER CHEST: Posterior bibasilar dependent atelectasis. LIVER: Mild hepatic steatosis. GALLBLADDER AND BILE DUCTS: Cholecystectomy. Mild common bile duct dilation, likely related to the prior cholecystectomy. SPLEEN: No acute abnormality. PANCREAS: No acute abnormality. ADRENAL GLANDS: No acute abnormality. KIDNEYS, URETERS AND BLADDER: No stones in the kidneys or ureters. No hydronephrosis. No perinephric or periureteral stranding. Partial distended urinary bladder. GI AND BOWEL: Stomach demonstrates no acute abnormality. There is no bowel obstruction. The appendix was not visualized. No pericecal inflammatory changes to suggest acute appendicitis. PERITONEUM AND RETROPERITONEUM: No ascites. No free air. No free pelvic fluid. VASCULATURE: Aorta is normal in caliber. Scattered aortoiliac atherosclerosis. LYMPH NODES: No lymphadenopathy. REPRODUCTIVE ORGANS: Hysterectomy. BONES AND SOFT TISSUES: Mild dextrocurvature of the thoracolumbar spine. Multilevel degenerative disc disease. Osteopenia. No acute osseous abnormality. No focal soft tissue abnormality. IMPRESSION: 1. No acute abnormality  in the abdomen or pelvis. 2. Mild hepatic steatosis. Electronically signed by: Rogelia Myers MD 07/01/2024 04:59 PM EST RP Workstation: [id number]    {Document cardiac monitor, telemetry assessment procedure when appropriate:32947} Procedures   Medications Ordered in the ED - No data to display    {Click here for ABCD2, HEART and other calculators REFRESH Note before signing:1}                              Medical Decision Making Amount and/or Complexity of Data Reviewed Labs: ordered.   ***  {Document critical care time when appropriate  Document review of labs and clinical decision tools ie CHADS2VASC2, etc  Document your independent review of radiology images and any outside records  Document your discussion with family members, caretakers and with consultants  Document social determinants of health affecting pt's care  Document your decision making why or why not admission, treatments were needed:32947:::1}   Final diagnoses:  None    ED Discharge Orders     None        "

## 2024-07-03 NOTE — Discharge Instructions (Signed)
 Eat and drink as well as to care for the next few days.  Please return for worsening symptoms especially if you cannot eat or drink or if you develop a measurable fever.

## 2024-07-03 NOTE — ED Provider Notes (Addendum)
 Received patient in turnover from Dr. Dreama.  Please see their note for further details of Hx, PE.  Briefly patient is a 74 y.o. female with a Weakness and Headache .  Patient with leg weakness and numbness.  Plain for MRI to assess for acute intraspinal pathology.  MRI is negative for acute stroke or acute intra spinal pathology..  I discussed results with patient and family.  Feel comfortable following up as an outpatient.  Family had looked up her symptoms on chat GPT and they are worried about a bloodstream infection.  Risks and benefits of blood cultures were discussed will obtain them prior to discharge.      Emil Share, DO 07/03/24 2104

## 2024-07-04 LAB — CULTURE, BLOOD (ROUTINE X 2)
Culture: NO GROWTH
Culture: NO GROWTH
Special Requests: ADEQUATE
Special Requests: ADEQUATE

## 2024-08-06 ENCOUNTER — Ambulatory Visit: Admitting: Urology

## 2024-09-15 ENCOUNTER — Ambulatory Visit
